# Patient Record
Sex: Female | Born: 1950 | Race: White | Hispanic: No | Smoking: Former smoker
Health system: Southern US, Community
[De-identification: ages and names within clinical notes are randomized; demographics above are authoritative.]

## PROBLEM LIST (undated history)

## (undated) DIAGNOSIS — Z86711 Personal history of pulmonary embolism: Secondary | ICD-10-CM

## (undated) DIAGNOSIS — F419 Anxiety disorder, unspecified: Secondary | ICD-10-CM

## (undated) DIAGNOSIS — K589 Irritable bowel syndrome without diarrhea: Secondary | ICD-10-CM

## (undated) DIAGNOSIS — H33199 Other retinoschisis and retinal cysts, unspecified eye: Secondary | ICD-10-CM

## (undated) DIAGNOSIS — K602 Anal fissure, unspecified: Secondary | ICD-10-CM

## (undated) DIAGNOSIS — G43909 Migraine, unspecified, not intractable, without status migrainosus: Secondary | ICD-10-CM

## (undated) DIAGNOSIS — K5790 Diverticulosis of intestine, part unspecified, without perforation or abscess without bleeding: Secondary | ICD-10-CM

## (undated) DIAGNOSIS — I2699 Other pulmonary embolism without acute cor pulmonale: Secondary | ICD-10-CM

## (undated) DIAGNOSIS — H409 Unspecified glaucoma: Secondary | ICD-10-CM

## (undated) DIAGNOSIS — E039 Hypothyroidism, unspecified: Secondary | ICD-10-CM

## (undated) DIAGNOSIS — H548 Legal blindness, as defined in USA: Secondary | ICD-10-CM

## (undated) DIAGNOSIS — D75A Glucose-6-phosphate dehydrogenase (G6PD) deficiency without anemia: Secondary | ICD-10-CM

## (undated) DIAGNOSIS — E7800 Pure hypercholesterolemia, unspecified: Secondary | ICD-10-CM

## (undated) DIAGNOSIS — F32A Depression, unspecified: Secondary | ICD-10-CM

## (undated) DIAGNOSIS — L309 Dermatitis, unspecified: Secondary | ICD-10-CM

## (undated) DIAGNOSIS — G43109 Migraine with aura, not intractable, without status migrainosus: Secondary | ICD-10-CM

## (undated) DIAGNOSIS — I10 Essential (primary) hypertension: Secondary | ICD-10-CM

## (undated) DIAGNOSIS — K760 Fatty (change of) liver, not elsewhere classified: Secondary | ICD-10-CM

## (undated) DIAGNOSIS — R1011 Right upper quadrant pain: Secondary | ICD-10-CM

## (undated) DIAGNOSIS — R091 Pleurisy: Secondary | ICD-10-CM

## (undated) DIAGNOSIS — L509 Urticaria, unspecified: Secondary | ICD-10-CM

## (undated) DIAGNOSIS — R112 Nausea with vomiting, unspecified: Secondary | ICD-10-CM

## (undated) DIAGNOSIS — K8020 Calculus of gallbladder without cholecystitis without obstruction: Secondary | ICD-10-CM

## (undated) DIAGNOSIS — N200 Calculus of kidney: Secondary | ICD-10-CM

## (undated) DIAGNOSIS — J069 Acute upper respiratory infection, unspecified: Secondary | ICD-10-CM

## (undated) DIAGNOSIS — D126 Benign neoplasm of colon, unspecified: Secondary | ICD-10-CM

## (undated) DIAGNOSIS — R079 Chest pain, unspecified: Secondary | ICD-10-CM

## (undated) DIAGNOSIS — R002 Palpitations: Secondary | ICD-10-CM

## (undated) DIAGNOSIS — N281 Cyst of kidney, acquired: Secondary | ICD-10-CM

## (undated) DIAGNOSIS — D573 Sickle-cell trait: Secondary | ICD-10-CM

## (undated) DIAGNOSIS — F329 Major depressive disorder, single episode, unspecified: Secondary | ICD-10-CM

## (undated) DIAGNOSIS — H269 Unspecified cataract: Secondary | ICD-10-CM

## (undated) DIAGNOSIS — J45909 Unspecified asthma, uncomplicated: Secondary | ICD-10-CM

## (undated) DIAGNOSIS — M1990 Unspecified osteoarthritis, unspecified site: Secondary | ICD-10-CM

## (undated) DIAGNOSIS — G8929 Other chronic pain: Secondary | ICD-10-CM

## (undated) DIAGNOSIS — Z9889 Other specified postprocedural states: Secondary | ICD-10-CM

## (undated) DIAGNOSIS — Z77098 Contact with and (suspected) exposure to other hazardous, chiefly nonmedicinal, chemicals: Secondary | ICD-10-CM

## (undated) DIAGNOSIS — K219 Gastro-esophageal reflux disease without esophagitis: Secondary | ICD-10-CM

## (undated) HISTORY — DX: Pleurisy: R09.1

## (undated) HISTORY — DX: Right upper quadrant pain: R10.11

## (undated) HISTORY — DX: Personal history of pulmonary embolism: Z86.711

## (undated) HISTORY — DX: Other chronic pain: G89.29

## (undated) HISTORY — DX: Calculus of kidney: N20.0

## (undated) HISTORY — DX: Acute upper respiratory infection, unspecified: J06.9

## (undated) HISTORY — DX: Irritable bowel syndrome, unspecified: K58.9

## (undated) HISTORY — DX: Anal fissure, unspecified: K60.2

## (undated) HISTORY — PX: ANGIOPLASTY: SHX39

## (undated) HISTORY — DX: Glucose-6-phosphate dehydrogenase (G6PD) deficiency without anemia: D75.A

## (undated) HISTORY — PX: ABDOMINAL HYSTERECTOMY: SHX81

## (undated) HISTORY — DX: Depression, unspecified: F32.A

## (undated) HISTORY — DX: Contact with and (suspected) exposure to other hazardous, chiefly nonmedicinal, chemicals: Z77.098

## (undated) HISTORY — DX: Urticaria, unspecified: L50.9

## (undated) HISTORY — DX: Dermatitis, unspecified: L30.9

## (undated) HISTORY — PX: TUBAL LIGATION: SHX77

## (undated) HISTORY — DX: Sickle-cell trait: D57.3

## (undated) HISTORY — DX: Legal blindness, as defined in USA: H54.8

## (undated) HISTORY — DX: Migraine, unspecified, not intractable, without status migrainosus: G43.909

## (undated) HISTORY — DX: Other retinoschisis and retinal cysts, unspecified eye: H33.199

## (undated) HISTORY — DX: Benign neoplasm of colon, unspecified: D12.6

## (undated) HISTORY — DX: Unspecified cataract: H26.9

## (undated) HISTORY — DX: Cyst of kidney, acquired: N28.1

## (undated) HISTORY — PX: OTHER SURGICAL HISTORY: SHX169

## (undated) HISTORY — DX: Calculus of gallbladder without cholecystitis without obstruction: K80.20

## (undated) HISTORY — PX: HEEL SPUR EXCISION: SHX1733

## (undated) HISTORY — DX: Fatty (change of) liver, not elsewhere classified: K76.0

## (undated) HISTORY — DX: Other pulmonary embolism without acute cor pulmonale: I26.99

## (undated) HISTORY — DX: Pure hypercholesterolemia, unspecified: E78.00

## (undated) HISTORY — PX: EXCISIONAL HEMORRHOIDECTOMY: SHX1541

## (undated) HISTORY — PX: LAPAROSCOPY: SHX197

## (undated) HISTORY — PX: APPENDECTOMY: SHX54

## (undated) HISTORY — DX: Unspecified glaucoma: H40.9

## (undated) HISTORY — DX: Major depressive disorder, single episode, unspecified: F32.9

## (undated) HISTORY — PX: CATARACT EXTRACTION: SUR2

## (undated) HISTORY — PX: ADENOIDECTOMY: SUR15

## (undated) HISTORY — DX: Diverticulosis of intestine, part unspecified, without perforation or abscess without bleeding: K57.90

## (undated) HISTORY — DX: Essential (primary) hypertension: I10

## (undated) HISTORY — DX: Anxiety disorder, unspecified: F41.9

## (undated) HISTORY — PX: TONSILLECTOMY: SUR1361

## (undated) HISTORY — PX: FOOT SURGERY: SHX648

---

## 1998-04-10 ENCOUNTER — Other Ambulatory Visit: Admission: RE | Admit: 1998-04-10 | Discharge: 1998-04-10 | Payer: Self-pay | Admitting: Obstetrics and Gynecology

## 1998-10-03 ENCOUNTER — Other Ambulatory Visit: Admission: RE | Admit: 1998-10-03 | Discharge: 1998-10-03 | Payer: Self-pay

## 1999-01-07 ENCOUNTER — Emergency Department (HOSPITAL_COMMUNITY): Admission: EM | Admit: 1999-01-07 | Discharge: 1999-01-07 | Payer: Self-pay | Admitting: Emergency Medicine

## 1999-01-07 ENCOUNTER — Encounter: Payer: Self-pay | Admitting: Emergency Medicine

## 2000-02-11 ENCOUNTER — Ambulatory Visit (HOSPITAL_COMMUNITY): Admission: RE | Admit: 2000-02-11 | Discharge: 2000-02-11 | Payer: Self-pay | Admitting: Unknown Physician Specialty

## 2000-02-11 HISTORY — PX: CARDIAC CATHETERIZATION: SHX172

## 2000-05-05 ENCOUNTER — Encounter: Payer: Self-pay | Admitting: Cardiology

## 2000-05-05 ENCOUNTER — Observation Stay (HOSPITAL_COMMUNITY): Admission: EM | Admit: 2000-05-05 | Discharge: 2000-05-06 | Payer: Self-pay | Admitting: Cardiology

## 2001-07-28 ENCOUNTER — Ambulatory Visit (HOSPITAL_BASED_OUTPATIENT_CLINIC_OR_DEPARTMENT_OTHER): Admission: RE | Admit: 2001-07-28 | Discharge: 2001-07-28 | Payer: Self-pay | Admitting: Podiatry

## 2001-12-19 ENCOUNTER — Encounter: Payer: Self-pay | Admitting: Internal Medicine

## 2001-12-19 ENCOUNTER — Ambulatory Visit (HOSPITAL_COMMUNITY): Admission: RE | Admit: 2001-12-19 | Discharge: 2001-12-19 | Payer: Self-pay | Admitting: Internal Medicine

## 2001-12-29 ENCOUNTER — Ambulatory Visit (HOSPITAL_COMMUNITY): Admission: RE | Admit: 2001-12-29 | Discharge: 2001-12-29 | Payer: Self-pay | Admitting: Internal Medicine

## 2001-12-29 ENCOUNTER — Encounter: Payer: Self-pay | Admitting: Internal Medicine

## 2002-09-12 ENCOUNTER — Encounter (HOSPITAL_COMMUNITY): Admission: RE | Admit: 2002-09-12 | Discharge: 2002-10-12 | Payer: Self-pay | Admitting: Oncology

## 2002-09-12 ENCOUNTER — Encounter: Admission: RE | Admit: 2002-09-12 | Discharge: 2002-09-12 | Payer: Self-pay | Admitting: Oncology

## 2002-12-21 ENCOUNTER — Ambulatory Visit (HOSPITAL_COMMUNITY): Admission: RE | Admit: 2002-12-21 | Discharge: 2002-12-21 | Payer: Self-pay | Admitting: Internal Medicine

## 2002-12-21 ENCOUNTER — Encounter: Payer: Self-pay | Admitting: Internal Medicine

## 2003-05-08 ENCOUNTER — Ambulatory Visit (HOSPITAL_COMMUNITY): Admission: RE | Admit: 2003-05-08 | Discharge: 2003-05-08 | Payer: Self-pay | Admitting: Family Medicine

## 2003-05-08 ENCOUNTER — Encounter: Payer: Self-pay | Admitting: Family Medicine

## 2003-05-21 ENCOUNTER — Ambulatory Visit (HOSPITAL_COMMUNITY): Admission: RE | Admit: 2003-05-21 | Discharge: 2003-05-21 | Payer: Self-pay | Admitting: Family Medicine

## 2003-05-21 ENCOUNTER — Encounter: Payer: Self-pay | Admitting: Family Medicine

## 2004-05-14 ENCOUNTER — Ambulatory Visit (HOSPITAL_COMMUNITY): Admission: RE | Admit: 2004-05-14 | Discharge: 2004-05-14 | Payer: Self-pay | Admitting: Family Medicine

## 2004-08-29 ENCOUNTER — Ambulatory Visit (HOSPITAL_COMMUNITY): Admission: RE | Admit: 2004-08-29 | Discharge: 2004-08-29 | Payer: Self-pay | Admitting: *Deleted

## 2004-12-01 ENCOUNTER — Ambulatory Visit (HOSPITAL_COMMUNITY): Admission: RE | Admit: 2004-12-01 | Discharge: 2004-12-01 | Payer: Self-pay | Admitting: *Deleted

## 2005-01-20 ENCOUNTER — Emergency Department (HOSPITAL_COMMUNITY): Admission: EM | Admit: 2005-01-20 | Discharge: 2005-01-21 | Payer: Self-pay | Admitting: *Deleted

## 2005-01-23 ENCOUNTER — Ambulatory Visit (HOSPITAL_COMMUNITY): Admission: RE | Admit: 2005-01-23 | Discharge: 2005-01-23 | Payer: Self-pay | Admitting: *Deleted

## 2005-04-03 ENCOUNTER — Ambulatory Visit: Payer: Self-pay | Admitting: Ophthalmology

## 2005-04-09 ENCOUNTER — Ambulatory Visit: Payer: Self-pay | Admitting: Ophthalmology

## 2005-05-19 ENCOUNTER — Ambulatory Visit (HOSPITAL_COMMUNITY): Admission: RE | Admit: 2005-05-19 | Discharge: 2005-05-19 | Payer: Self-pay | Admitting: *Deleted

## 2005-10-22 ENCOUNTER — Ambulatory Visit: Payer: Self-pay | Admitting: Internal Medicine

## 2005-11-18 ENCOUNTER — Ambulatory Visit: Payer: Self-pay | Admitting: Internal Medicine

## 2005-11-18 ENCOUNTER — Ambulatory Visit (HOSPITAL_COMMUNITY): Admission: RE | Admit: 2005-11-18 | Discharge: 2005-11-18 | Payer: Self-pay | Admitting: Internal Medicine

## 2005-11-24 ENCOUNTER — Ambulatory Visit (HOSPITAL_COMMUNITY): Admission: RE | Admit: 2005-11-24 | Discharge: 2005-11-24 | Payer: Self-pay | Admitting: Internal Medicine

## 2006-08-27 ENCOUNTER — Ambulatory Visit (HOSPITAL_COMMUNITY): Admission: RE | Admit: 2006-08-27 | Discharge: 2006-08-27 | Payer: Self-pay | Admitting: Internal Medicine

## 2007-06-16 ENCOUNTER — Ambulatory Visit: Payer: Self-pay | Admitting: *Deleted

## 2007-09-02 ENCOUNTER — Encounter (HOSPITAL_COMMUNITY): Admission: RE | Admit: 2007-09-02 | Discharge: 2007-10-02 | Payer: Self-pay | Admitting: Internal Medicine

## 2007-09-28 ENCOUNTER — Ambulatory Visit: Payer: Self-pay | Admitting: Gastroenterology

## 2007-10-05 ENCOUNTER — Ambulatory Visit (HOSPITAL_COMMUNITY): Admission: RE | Admit: 2007-10-05 | Discharge: 2007-10-05 | Payer: Self-pay | Admitting: Gastroenterology

## 2007-10-06 DIAGNOSIS — G8929 Other chronic pain: Secondary | ICD-10-CM

## 2007-10-06 HISTORY — DX: Other chronic pain: G89.29

## 2007-10-13 ENCOUNTER — Encounter: Payer: Self-pay | Admitting: Gastroenterology

## 2007-10-13 ENCOUNTER — Ambulatory Visit (HOSPITAL_COMMUNITY): Admission: RE | Admit: 2007-10-13 | Discharge: 2007-10-13 | Payer: Self-pay | Admitting: Gastroenterology

## 2007-10-27 ENCOUNTER — Ambulatory Visit: Payer: Self-pay | Admitting: Gastroenterology

## 2007-11-15 ENCOUNTER — Ambulatory Visit: Payer: Self-pay | Admitting: Gastroenterology

## 2007-12-04 HISTORY — PX: CHOLECYSTECTOMY: SHX55

## 2007-12-05 ENCOUNTER — Ambulatory Visit (HOSPITAL_COMMUNITY): Admission: RE | Admit: 2007-12-05 | Discharge: 2007-12-06 | Payer: Self-pay | Admitting: General Surgery

## 2007-12-05 ENCOUNTER — Encounter (INDEPENDENT_AMBULATORY_CARE_PROVIDER_SITE_OTHER): Payer: Self-pay | Admitting: General Surgery

## 2008-03-01 ENCOUNTER — Ambulatory Visit: Payer: Self-pay | Admitting: Gastroenterology

## 2008-09-12 ENCOUNTER — Ambulatory Visit (HOSPITAL_COMMUNITY): Admission: RE | Admit: 2008-09-12 | Discharge: 2008-09-12 | Payer: Self-pay | Admitting: Internal Medicine

## 2009-10-02 ENCOUNTER — Ambulatory Visit (HOSPITAL_COMMUNITY): Admission: RE | Admit: 2009-10-02 | Discharge: 2009-10-02 | Payer: Self-pay | Admitting: Internal Medicine

## 2009-12-10 ENCOUNTER — Inpatient Hospital Stay (HOSPITAL_COMMUNITY): Admission: EM | Admit: 2009-12-10 | Discharge: 2009-12-15 | Payer: Self-pay | Admitting: Emergency Medicine

## 2009-12-21 ENCOUNTER — Emergency Department (HOSPITAL_COMMUNITY): Admission: EM | Admit: 2009-12-21 | Discharge: 2009-12-21 | Payer: Self-pay | Admitting: Emergency Medicine

## 2010-01-03 ENCOUNTER — Ambulatory Visit (HOSPITAL_COMMUNITY): Admission: RE | Admit: 2010-01-03 | Discharge: 2010-01-03 | Payer: Self-pay | Admitting: Internal Medicine

## 2010-04-19 ENCOUNTER — Ambulatory Visit (HOSPITAL_COMMUNITY): Admission: RE | Admit: 2010-04-19 | Discharge: 2010-04-19 | Payer: Self-pay | Admitting: Internal Medicine

## 2010-06-21 ENCOUNTER — Ambulatory Visit (HOSPITAL_COMMUNITY)
Admission: RE | Admit: 2010-06-21 | Discharge: 2010-06-21 | Payer: Self-pay | Source: Home / Self Care | Admitting: Internal Medicine

## 2010-10-03 ENCOUNTER — Ambulatory Visit (HOSPITAL_COMMUNITY)
Admission: RE | Admit: 2010-10-03 | Discharge: 2010-10-03 | Payer: Self-pay | Source: Home / Self Care | Attending: Internal Medicine | Admitting: Internal Medicine

## 2010-12-09 ENCOUNTER — Encounter: Payer: Self-pay | Admitting: Internal Medicine

## 2010-12-10 ENCOUNTER — Encounter: Payer: Self-pay | Admitting: Gastroenterology

## 2010-12-10 ENCOUNTER — Ambulatory Visit (INDEPENDENT_AMBULATORY_CARE_PROVIDER_SITE_OTHER): Payer: Self-pay | Admitting: Gastroenterology

## 2010-12-10 DIAGNOSIS — R1011 Right upper quadrant pain: Secondary | ICD-10-CM | POA: Insufficient documentation

## 2010-12-10 DIAGNOSIS — K589 Irritable bowel syndrome without diarrhea: Secondary | ICD-10-CM

## 2010-12-11 ENCOUNTER — Encounter: Payer: Self-pay | Admitting: Internal Medicine

## 2010-12-11 ENCOUNTER — Other Ambulatory Visit: Payer: Self-pay | Admitting: Internal Medicine

## 2010-12-11 DIAGNOSIS — R1011 Right upper quadrant pain: Secondary | ICD-10-CM

## 2010-12-12 ENCOUNTER — Encounter: Payer: Self-pay | Admitting: Gastroenterology

## 2010-12-13 ENCOUNTER — Encounter: Payer: Self-pay | Admitting: Gastroenterology

## 2010-12-15 ENCOUNTER — Ambulatory Visit: Payer: Self-pay | Admitting: Gastroenterology

## 2010-12-16 ENCOUNTER — Ambulatory Visit (HOSPITAL_COMMUNITY)
Admission: RE | Admit: 2010-12-16 | Discharge: 2010-12-16 | Disposition: A | Discharge status: Home / Self Care | Payer: Medicaid Other | Source: Ambulatory Visit | Attending: Internal Medicine | Admitting: Internal Medicine

## 2010-12-16 DIAGNOSIS — I10 Essential (primary) hypertension: Secondary | ICD-10-CM | POA: Insufficient documentation

## 2010-12-16 DIAGNOSIS — K7689 Other specified diseases of liver: Secondary | ICD-10-CM | POA: Insufficient documentation

## 2010-12-16 DIAGNOSIS — R110 Nausea: Secondary | ICD-10-CM | POA: Insufficient documentation

## 2010-12-16 DIAGNOSIS — R1011 Right upper quadrant pain: Secondary | ICD-10-CM | POA: Insufficient documentation

## 2010-12-16 NOTE — Letter (Signed)
Summary: ABD U/S ORDER  ABD U/S ORDER   Imported By: Sofie Rower 12/11/2010 08:59:00  _____________________________________________________________________  External Attachment:    Type:   Image     Comment:   External Document

## 2010-12-16 NOTE — Medication Information (Addendum)
Summary: LANSOPRAZOLE DR 30MG  LANSOPRAZOLE DR 30MG   Imported By: Hoy Morn 12/12/2010 11:18:40  _____________________________________________________________________  External Attachment:    Type:   Image     Comment:   External Document  Appended Document: LANSOPRAZOLE DR 30MG needs P.A.  Appended Document: LANSOPRAZOLE DR 30MG working on [employer]

## 2010-12-16 NOTE — Letter (Signed)
Summary: TCS ORDER  TCS ORDER   Imported By: Sofie Rower 12/11/2010 08:57:59  _____________________________________________________________________  External Attachment:    Type:   Image     Comment:   External Document

## 2010-12-16 NOTE — Assessment & Plan Note (Signed)
Summary: ABD PAIN,CONSULT FOR TCS   Vital Signs:  Patient profile:   60 year old female Height:      67 inches Weight:      196 pounds BMI:     30.81 Temp:     98.3 degrees F oral Pulse rate:   92 / minute BP sitting:   124 / 76  (left arm)  Vitals Entered By: Loney Loh LPN (December 09, 6960 95:28 AM)  Visit Type:  Initial Consult Referring Provider:  Dr. Legrand Rams  Primary Care Provider:  Dr. Legrand Rams   History of Present Illness: [name] is a 60 year old pleasant female who presents today with RUQ pain and requesting updated colonoscopy. Upon review of chart, she has a hx of chronic RUQ pain, despite cholecystectomy in past.  Today, reports RUQ pain, worsening for past 6 weeks. feels like somebody is leaning into her with an elbow. radiates to back. stabbing. intermittent, yet never really goes completely away. sometimes bra will push on area and makes worse.  no jaundice, pruritis. +N/V with severe pain. As of note, prior to chole in March 2009, actually underwent EUS, which showed slightly dilated CBD (up to 7.5 mm), otherwise nl. Proceeded with chole in March 2009:  Reports blood in stool X 6 weeks, more over past week as well as paper hematochezia.  Denies issues with reflux. Has taken Nexium, prilosec in remote past for this discomfort. didn't help. has tried digestive aids, which worked years ago. however, notices no difference. sometimes alka setzer will offer slight relief. rare use of motrin.  4-5 BMs/day. Hx of IBS. usually goes first thing in the morning. urgency, not necessarily loose.  Feels more bloated. Eats small meals throughout the day. Wt fluctuates. No significant wt loss. No dysphagia/odynophagia.   husband just diagnosed with stage 4 prostate ca.   TCS Feb 2007: small external hemorrhoids, otherwise nl. Due for repeat 5-10 years (mother +colon ca)  Current Medications (verified): 1)  Tricor 145 Mg Tabs (Fenofibrate) .... Take One Once Daily 2)  Alprazolam 0.5 Mg  Tabs (Alprazolam) .... Take One Once Daily As Needed 3)  Clindamycin Hcl 300 Mg Caps (Clindamycin Hcl) .... Take Two Prior To Appointment 4)  Zolpidem Tartrate 10 Mg Tabs (Zolpidem Tartrate) .... Take One Once Daily At Bed Time 5)  Brimonidine Tartrate 0.15 % Soln (Brimonidine Tartrate) .... One Drop Two Times A Day in Each Eye 6)  Latanoprost 0.005 % Soln (Latanoprost) .... One Drop in Each Eye Once Daily  Allergies (verified): 1)  ! Sulfa 2)  ! * Fava Beans  Past History:  Past Medical History: Chronic RUQ pain HTN Hypercholesterolemia Glaucoma Mitral valve prolapse?  Past Surgical History: cholecystectomy complete hysterectomy appendectomy cataract surgery angioplasty  Family History: Mother: deceased, age 58, colon ca Father:deceased, pallete cancer at 76 Sister: Crohns Brother: intestinal fissures?? DM  Social History: Occupation: housewife Patient is a former smoker.  Alcohol Use - yes, socially Illicit Drug Use - no Smoking Status:  quit Drug Use:  no  Review of Systems General:  Denies fever, chills, and anorexia. Eyes:  Denies blurring, irritation, and discharge. ENT:  Denies sore throat, hoarseness, and difficulty swallowing. CV:  Denies chest pains and syncope. Resp:  Denies dyspnea at rest and wheezing. GI:  See HPI. GU:  Denies urinary burning and urinary frequency. MS:  Denies joint stiffness. Derm:  Denies rash, itching, and dry skin. Neuro:  Denies weakness and syncope. Psych:  Denies depression and anxiety.  Physical  Exam  General:  Well developed, well nourished, no acute distress. Head:  Normocephalic and atraumatic. Eyes:  sclera without icterus Lungs:  Clear throughout to auscultation. Heart:  Regular rate and rhythm; no murmurs, rubs,  or bruits. Abdomen:  +BS, soft, ttp in RUQ, non-distended, no HSM. No rebound or guarding Msk:  Symmetrical with no gross deformities. Normal posture. Neurologic:  Alert and  oriented x4;  grossly  normal neurologically. Skin:  Intact without significant lesions or rashes. Psych:  Alert and cooperative. Normal mood and affect.   Impression & Recommendations:  Problem # 1:  ABDOMINAL PAIN RIGHT UPPER QUADRANT (ICD-33.46) 60 year old female with hx of chronic RUQ pain dating back to 2008, slight improvement after cholecystectomy in March 2009. Continues to report constant, stabbing pain, radiates to back. Marland Kitchen +N/V if pain severe. has had EUS in 2008. Doubt this is a picture of gastritis. May have component of functional abd pain heightened by anxiety (husband diagnosed with prostate ca). ?pancreatic involvement. LFTs nl Jan 2012.  [place] of abd, assess for CBD dilatation, other etiology Lipase (CMP already on file) Prevacid 30 mg by mouth daily to pharmacy of choice (pt has done well with this in past)   Orders: T-Lipase ([id number]) Consultation Level III (07218)  Problem # 2:  IRRITABLE BOWEL SYNDROME (ICD-564.1)  Hx of IBS, reports 4-5BMs per day. +urgency, may or may not be loose stools. Feels bloated. brbpr X 6 weeks, more over past week. +FH of colon ca 1st degree relative (mom). Last TCS in 2007 with small external hemorrhoids otherwise nl. Likely hematochezia secondary to hemorrhoids; will proceed with colonoscopy due to [place].   TCS with Dr. Gala Romney: the R/B/A have been discussed in detail; pt states understanding and desires to proceed. Probiotic daily high fiber diet ?Bentyl in future  Orders: Consultation Level III (28833) Prescriptions: PREVACID 30 MG CPDR (LANSOPRAZOLE) take 1 by mouth 30 minutes before breakfast  #31 x 1   Entered and Authorized by:   Laban Emperor NP   Signed by:   Laban Emperor NP on 12/10/2010   Method used:   Faxed to ...       [facility] (retail)       [address]       [place], [place]  [address]       Ph: [phone] or [phone]       Fax: [phone]   RxID:   [id number]    Orders Added: 1)  T-Lipase  [id number] 2)  Consultation Level III [27670]

## 2010-12-22 ENCOUNTER — Encounter: Payer: Medicaid Other | Admitting: Internal Medicine

## 2010-12-22 ENCOUNTER — Other Ambulatory Visit: Payer: Self-pay | Admitting: Internal Medicine

## 2010-12-22 ENCOUNTER — Encounter: Payer: Self-pay | Admitting: Gastroenterology

## 2010-12-22 ENCOUNTER — Ambulatory Visit (HOSPITAL_COMMUNITY)
Admission: RE | Admit: 2010-12-22 | Discharge: 2010-12-22 | Disposition: A | Discharge status: Home / Self Care | Payer: Medicaid Other | Source: Ambulatory Visit | Attending: Internal Medicine | Admitting: Internal Medicine

## 2010-12-22 DIAGNOSIS — Z860100 Personal history of colon polyps, unspecified: Secondary | ICD-10-CM | POA: Insufficient documentation

## 2010-12-22 DIAGNOSIS — E785 Hyperlipidemia, unspecified: Secondary | ICD-10-CM | POA: Insufficient documentation

## 2010-12-22 DIAGNOSIS — K921 Melena: Secondary | ICD-10-CM | POA: Insufficient documentation

## 2010-12-22 DIAGNOSIS — D126 Benign neoplasm of colon, unspecified: Secondary | ICD-10-CM

## 2010-12-22 DIAGNOSIS — R109 Unspecified abdominal pain: Secondary | ICD-10-CM | POA: Insufficient documentation

## 2010-12-22 HISTORY — PX: COLONOSCOPY: SHX174

## 2010-12-28 LAB — BASIC METABOLIC PANEL
BUN: 10 mg/dL (ref 6–23)
BUN: 8 mg/dL (ref 6–23)
BUN: 9 mg/dL (ref 6–23)
CO2: 25 mEq/L (ref 19–32)
CO2: 26 mEq/L (ref 19–32)
Calcium: 10.1 mg/dL (ref 8.4–10.5)
Calcium: 8.8 mg/dL (ref 8.4–10.5)
Calcium: 8.8 mg/dL (ref 8.4–10.5)
Chloride: 104 mEq/L (ref 96–112)
Chloride: 107 mEq/L (ref 96–112)
Creatinine, Ser: 1.26 mg/dL — ABNORMAL HIGH (ref 0.4–1.2)
Creatinine, Ser: 1.28 mg/dL — ABNORMAL HIGH (ref 0.4–1.2)
Creatinine, Ser: 1.51 mg/dL — ABNORMAL HIGH (ref 0.4–1.2)
GFR calc Af Amer: 52 mL/min — ABNORMAL LOW (ref >60)
GFR calc Af Amer: 55 mL/min — ABNORMAL LOW (ref >60)
GFR calc non Af Amer: 44 mL/min — ABNORMAL LOW (ref >60)
Glucose, Bld: 129 mg/dL — ABNORMAL HIGH (ref 70–99)
Potassium: 3.8 mEq/L (ref 3.5–5.1)
Sodium: 140 mEq/L (ref 135–145)

## 2010-12-28 LAB — DIFFERENTIAL
Basophils Absolute: 0 10*3/uL (ref 0.0–0.1)
Basophils Relative: 0 % (ref 0–1)
Basophils Relative: 0 % (ref 0–1)
Basophils Relative: 1 % (ref 0–1)
Eosinophils Absolute: 0.3 10*3/uL (ref 0.0–0.7)
Eosinophils Absolute: 0.3 10*3/uL (ref 0.0–0.7)
Lymphocytes Relative: 16 % (ref 12–46)
Lymphocytes Relative: 26 % (ref 12–46)
Lymphocytes Relative: 27 % (ref 12–46)
Lymphs Abs: 1.4 10*3/uL (ref 0.7–4.0)
Lymphs Abs: 1.5 10*3/uL (ref 0.7–4.0)
Monocytes Absolute: 0.5 10*3/uL (ref 0.1–1.0)
Monocytes Absolute: 0.7 10*3/uL (ref 0.1–1.0)
Monocytes Relative: 7 % (ref 3–12)
Neutro Abs: 3.3 10*3/uL (ref 1.7–7.7)
Neutro Abs: 4.3 10*3/uL (ref 1.7–7.7)
Neutro Abs: 4.4 10*3/uL (ref 1.7–7.7)
Neutro Abs: 6.5 10*3/uL (ref 1.7–7.7)
Neutrophils Relative %: 58 % (ref 43–77)
Neutrophils Relative %: 66 % (ref 43–77)
Neutrophils Relative %: 74 % (ref 43–77)

## 2010-12-28 LAB — URINE MICROSCOPIC-ADD ON

## 2010-12-28 LAB — URINE CULTURE

## 2010-12-28 LAB — COMPREHENSIVE METABOLIC PANEL
Albumin: 3.8 g/dL (ref 3.5–5.2)
Alkaline Phosphatase: 30 U/L — ABNORMAL LOW (ref 39–117)
BUN: 16 mg/dL (ref 6–23)
Creatinine, Ser: 1.86 mg/dL — ABNORMAL HIGH (ref 0.4–1.2)
Glucose, Bld: 130 mg/dL — ABNORMAL HIGH (ref 70–99)
Total Bilirubin: 0.7 mg/dL (ref 0.3–1.2)
Total Protein: 6.5 g/dL (ref 6.0–8.3)

## 2010-12-28 LAB — CBC
HCT: 29.5 % — ABNORMAL LOW (ref 36.0–46.0)
Hemoglobin: 10.4 g/dL — ABNORMAL LOW (ref 12.0–15.0)
Hemoglobin: 10.4 g/dL — ABNORMAL LOW (ref 12.0–15.0)
MCHC: 34.7 g/dL (ref 30.0–36.0)
MCHC: 35.8 g/dL (ref 30.0–36.0)
MCV: 94.5 fL (ref 78.0–100.0)
MCV: 96 fL (ref 78.0–100.0)
MCV: 97.1 fL (ref 78.0–100.0)
Platelets: 146 10*3/uL — ABNORMAL LOW (ref 150–400)
Platelets: 180 10*3/uL (ref 150–400)
Platelets: 186 10*3/uL (ref 150–400)
RBC: 3.17 MIL/uL — ABNORMAL LOW (ref 3.87–5.11)
RDW: 12.1 % (ref 11.5–15.5)
WBC: 5.8 10*3/uL (ref 4.0–10.5)
WBC: 6.6 10*3/uL (ref 4.0–10.5)

## 2010-12-28 LAB — IRON AND TIBC
Iron: 23 ug/dL — ABNORMAL LOW (ref 42–135)
TIBC: 329 ug/dL (ref 250–470)

## 2010-12-28 LAB — CULTURE, BLOOD (ROUTINE X 2)
Culture: NO GROWTH
Report Status: 3132011
Report Status: 3132011
Report Status: 3142011
Report Status: 3142011

## 2010-12-28 LAB — HEMOCCULT GUIAC POC 1CARD (OFFICE)
Fecal Occult Bld: NEGATIVE
Fecal Occult Bld: NEGATIVE
Fecal Occult Bld: NEGATIVE

## 2010-12-28 LAB — URINALYSIS, ROUTINE W REFLEX MICROSCOPIC
Hgb urine dipstick: NEGATIVE
Nitrite: NEGATIVE
Protein, ur: NEGATIVE mg/dL
Specific Gravity, Urine: <1.005 — ABNORMAL LOW (ref 1.005–1.030)
Urobilinogen, UA: 0.2 mg/dL (ref 0.0–1.0)

## 2010-12-28 LAB — VITAMIN B12: Vitamin B-12: 580 pg/mL (ref 211–911)

## 2010-12-28 LAB — RETICULOCYTES
RBC.: 2.63 MIL/uL — ABNORMAL LOW (ref 3.87–5.11)
Retic Count, Absolute: 71 10*3/uL (ref 19.0–186.0)

## 2010-12-30 NOTE — Op Note (Signed)
  NAME:  [name], [name]              ACCOUNT NO.:  [id number]  MEDICAL RECORD NO.:  [id number]           PATIENT TYPE:  O  LOCATION:  DAYP                          FACILITY:  [facility]  PHYSICIAN:  R. Garfield Cornea, M.D. DATE OF BIRTH:  [birth year]  DATE OF PROCEDURE: DATE OF DISCHARGE:                              OPERATIVE REPORT   SURGEON:  R. Garfield Cornea, MD  A 60 year old lady with personal history of colonic polyps, chronic abdominal pain (much improved over the past 1 week), here for colonoscopy, was having some paper hematochezia, but this resolved. Status post cholecystectomy, recent abdominal ultrasound demonstrated no mild dilation of biliary tree, small left hepatic cyst, epigastric pain, serum lipase came back normal at 35.  Colonoscopy now being performed. Risks, benefits, limitations, alternatives, and imponderables have been discussed, questions answered.  Please see the documentation in the medical record.  PROCEDURE NOTE:  O2 saturation, blood pressure, pulse, respirations were monitored throughout the entire procedure.  CONSCIOUS SEDATION:  Versed 8 mg IV, Demerol 175 mg IV in divided doses, Phenergan 25 mg dilute slow IV push to in divided dose to augment conscious sedation.  INSTRUMENT:  Pentax video chip system.  FINDINGS:  Digital rectal exam revealed no abnormalities.  Endoscopic findings:  Prep was adequate.  Colon:  Colonic mucosa was surveyed from the rectosigmoid junction through the left transverse, right colon, appendiceal orifice, ileocecal valve, and cecum.  These structures were well seen and photographed for the record.  Terminal ileum was intubated to 10 cm.  From this level, scope was slowly and cautiously withdrawn. All previous mucosal surfaces were again seen.  The patient has single diminutive polyp at the base of the cecum, which was cold biopsied/removed.  The patient had some scattered submucosal petechiae on the left colon of uncertain  significance.  Remainder of the colonic mucosa appeared entirely normal as did the terminal ileal mucosa.  The scope was pulled down in to the rectum, where thorough examination of the rectal mucosa including retroflexed view of the anal verge demonstrated no abnormalities.  The patient tolerated the procedure well.  Cecal withdrawal time 10 minutes.  IMPRESSION: 1. Normal rectum. 2. Few scattered submucosal petechiae on left colon, otherwise normal     colonic mucosa, terminal ileal mucosa aside from diminutive cecal     polyp, status post cold biopsy removal.  RECOMMENDATIONS: 1. Continue Prevacid 30 mg orally daily. 2. Follow up on path. 3. Further recommendations to follow.     Bridgette Habermann, M.D.     RMR/MEDQ  D:  12/22/2010  T:  12/22/2010  Job:  [id number]  cc:   Tesfaye D. Legrand Rams, MD Fax: [phone]  Electronically Signed by Jannette Spanner M.D. on 12/30/2010 11:22:56 AM

## 2011-01-01 NOTE — Medication Information (Signed)
Summary: PA for lansoprazole  PA for lansoprazole   Imported By: Burnadette Peter LPN 22/97/9892 11:94:17  _____________________________________________________________________  External Attachment:    Type:   Image     Comment:   External Document

## 2011-02-17 NOTE — Assessment & Plan Note (Signed)
[name], [name]               CHART#:  [id number]   DATE:  09/28/2007                       DOB:  10-31-1950   REFERRING PHYSICIAN:  Tesfaye D. Legrand Rams, MD.   PROBLEM LIST:  1. Abdominal pain.  2. Glaucoma.  3. Anxiety.  4. Lysis of adhesions in 2006.   SUBJECTIVE:  [name] is a 60 year old female who presents as a  return patient visit.  She was last seen by Dr. Laural Golden in February of  2007.  He performed a colonoscopy because she was complaining of right  lower quadrant pain and had a known history of extensive adhesions and  polyps.  She said she has not been having problems with her abdomen  since 2001.  She began to have pain in her right side in September of  2008.  She did a homeopathic regimen and states she passed a green  gelatinous thing.  She complains of having pain in her right upper  quadrant.  Dr. Legrand Rams obtained a hepatic function panel, amylase and  lipase on the day she had a HIDA scan and the total bili was 1.1,  alkaline phosphatase 59, AST 32, ALT 31, albumin 4.8, lipase 10.  Her  HIDA scan showed an ejection fraction of 47.3 with normal being greater  than 50%.  She had no evidence of cystic duct obstruction or common bile  duct obstruction.  The tracer was seen in the bile duct at 5-10 minutes  and had activity in the small bowel by 15 minutes and in the gallbladder  at 30 minutes.  There was filling of the gallbladder and spill of  contrast into the small bowel during the study.   She describes the pain as a pressure in her right side.  It pushes to  her back.  It can be stabbing and tearing at times.  She feels an  increase in the intensity of the pain and then releases and then she  gets the bad pain again.  Her episodes usually last continuously for 4  days.  Her first episode began after she ate steak.  Her last episode  was 12/16 to 12/19.  She has 2 soft bowel movements a day.  She does not  eat anything that precipitates the pain.  She  denies any diarrhea, blood  in her stool or black stool that looks like tar.  The only thing that  makes the pain better is time and massaging it.  Note she also has tried  Flexeril which helps.  If the pain is present it is worse after eating.  If she eats too much the pain increases.  She starts to feel bloated.  She also tried vinegar and antacids without any relief.  She has seen  Dr. Lajoyce Corners in the past for abdominal pain but this is a different pain.  This one is now up higher in her ribs.   MEDICATIONS:  1. Xalatan.  2. __________ .  3. Ativan as needed.  4. Propoxyphene as needed.  5. Avalide daily.  6. NitroQuick as needed.  7. Ambien 10 mg daily at bedtime.   OBJECTIVE:  VITAL SIGNS:  Weight 183 pounds, height 5 feet 7 inches  (weight unchanged since 1998).  BMI 28.7 (overweight).  Temperature  98.3, blood pressure 100/80, pulse 88.  GENERAL:  She  is in no apparent distress.  Alert and oriented x4.  HEENT:  Atraumatic, normocephalic.  Pupils equal and reactive to light.  Mouth, no oral lesions.  Posterior pharynx without erythema or exudate.  NECK:  Has full range of motion.  No lymphadenopathy.  LUNGS:  Clear to auscultation bilaterally.  CARDIOVASCULAR:  Regular rhythm.  No murmur.  Normal S1 and S2.  ABDOMEN:  Bowel sounds present and soft.  Nontender.  Nondistended.  No  rebound.  No guarding.  EXTREMITIES:  Her extremities were without clubbing, cyanosis or edema.  NEUROLOGICAL:  She has no focal neurological deficits.   ASSESSMENT:  [name] is a 60 year old female with intermittent  severe right upper quadrant pain that radiates to her back.  The  differential diagnosis includes intermittent small bowel obstruction and  a low likelihood of intermittent biliary obstruction due to sludge or  microlithiasis, or functional abdominal pain.  Doubt she is having  episodes of gastritis.  Thank you for allowing me to see [name]  in consultation.  My  recommendations follow.   RECOMMENDATIONS:  1. Will order a small bowel follow through to evaluate for adhesive      disease.  2. She will get a hepatic function panel and lipase today and then she      is given a prescription to repeat those values if her pain returns.  3. We will schedule an endoscopic ultrasound referral in [facility] to      look for microlithiasis or sludge.  She has a low likelihood of      having sphincter of Oddi dysfunction.  4. Outpatient visit in 6 weeks.       Caro Hight, M.D.  Electronically Signed     SM/MEDQ  D:  09/28/2007  T:  09/28/2007  Job:  [id number]   cc:   Tesfaye D. Legrand Rams, MD

## 2011-02-17 NOTE — Assessment & Plan Note (Signed)
Joanne, [name]               CHART#:  [id number]   DATE:  03/01/2008                       DOB:  [birth year]   REFERRING PHYSICIAN:  Tesfaye D. Legrand Rams, M.D.   PROBLEM LIST:  1. Epigastric and right upper quadrant abdominal pain, likely      multifactorial, but had a component of chronic cholecystitis and is      status post laparoscopic cholecystectomy by Dr. Felicie Morn.  2. Glaucoma.  3. Anxiety.  4. Lysis of adhesions in 2006.   SUBJECTIVE:  [name] is a 60 year old female who presents as a  return patient visit.  She was last seen in February 2009.  She was  complaining of right upper quadrant pain, nausea and vomiting.  Her  gallbladder was removed.  Her pathology showed gallbladder mucosa with  mild chronic inflammation.  Her symptoms are clinically improved.  She  still has some constant right upper quadrant discomfort, and sometimes  it is more noticeable than others.  She is not having any problems  eating any food.  She definitely does not feel like she is having a  heart attack anymore.  She lost her puffiness and feels a whole lot  better.   MEDICATIONS:  1. Xalatan.  2. Bromadine.  3. Propoxyphene.  4. Avalide.  5. NitroQuick.  6. Zolpidem.  7. Herbal digestive aid 3 times daily.   OBJECTIVE/PHYSICAL EXAMINATION:  VITAL SIGNS:  Weight 179 pounds (down 3  pounds since February 2009), height 5 feet 7 inches, BMI 28 (slightly  overweight).  Temperature 98.5, blood pressure 110/78, pulse 92.  GENERAL:  In general she is in no apparent distress.  Alert and oriented  x4.  LUNGS:  Clear to auscultation bilaterally. CARDIOVASCULAR:  Shows a  regular rhythm.  No murmur.  ABDOMEN:  Bowel sounds present.  Soft,  nontender, nondistended.  Mild tenderness to palpation in the right  upper quadrant, not associated with her incisions.   ASSESSMENT:  [name] is a 60 year old female whose mother had  colon cancer diagnosed after the age of 25.  She has a  history of a  simple adenoma in 1998.  Thank you for allowing me to see [name]  in consultation.  My recommendations follow.   RECOMMENDATIONS:  1. She has a followup appointment to see me in 6 months.  2. She may have a colonoscopy every 5 to 10 years due to a first-      degree relative with colon cancer and her personal history of      adenomatous polyp.       Caro Hight, M.D.  Electronically Signed     SM/MEDQ  D:  03/01/2008  T:  03/01/2008  Job:  [id number]   cc:   Tesfaye D. Legrand Rams, MD

## 2011-02-17 NOTE — Op Note (Signed)
[name], [name]              ACCOUNT NO.:  [id number]   MEDICAL RECORD NO.:  [id number]          PATIENT TYPE:  OIB   LOCATION:  A305                          FACILITY:  [facility]   PHYSICIAN:  Felicie Morn, M.D. DATE OF BIRTH:  03-Mar-1951   DATE OF PROCEDURE:  12/05/2007  DATE OF DISCHARGE:                               OPERATIVE REPORT   SURGEON:  Felicie Morn, M.D.   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   SPECIMEN:  Gallbladder.   INDICATIONS FOR PROCEDURE:  This is 60 year old black female who had a  history of sickle cell trait and long-term history of right upper  quadrant pain, nausea and vomiting which was postprandial in nature and  which radiated to her back.  She had a thorough GI workup which included  a sonogram, CT scan, hepatobiliary scan and small bowel series.  She was  referred for laparoscopic cholecystectomy for biliary dyskinesia by the  GI service.  Preoperative liver function studies were grossly within  normal limits, mild elevation of ALT at 39, however, grossly was totally  within normal limits.  Bilirubin was not elevated. This patient has  sickle cell trait.   We discussed surgery with this patient in detail discussing the  complications not limited to, but including bleeding, infection, damage  to bile ducts, perforation of organ, transitory diarrhea and the  possibility that open surgery might be required.  Informed consent was  obtained.  We also discussed with her that results for cholecystectomy  for biliary dyskinesia were not as good as for cholecystectomy with  cholelithiasis.  Informed consent was obtained.   GROSS OPERATIVE FINDINGS:  The patient had adhesions and fatty  infiltration of the liver and the gallbladder near the distal end.  Cystic duct and cystic artery were clearly  visualized, a long cystic  duct which was not cannulated.  The right upper quadrant otherwise  appeared to be normal.  She had some adhesions below the umbilicus from  her previous surgery.  These were not involving any of the right upper  quadrant pathology.   TECHNIQUE:  The patient was placed in supine position after the adequate  administration of general anesthesia via endotracheal intubation.  Her  entire abdomen was prepped with Betadine solution and draped in the  usual manner.  Following aseptic technique, a Foley catheter was placed.  A periumbilical incision was carried out over the superior aspect of the  umbilicus.  The fascia was revealed and grasped with a sharp towel clip  and elevated and with the patient in Trendelenburg, a Veress needle was  inserted and confirmed in position with a saline drop test.  We then  insufflated the abdomen with approximately 3.5 liters of CO2 and then  using the Visiport technique, an 11 mm cannula was placed in the  umbilicus and then under direct vision, another 11 mm cannula was placed  in the epigastrium and two 5 mm cannulas were placed in the right upper  quadrant laterally.  The  gallbladder was grasped.  Its adhesions were  taken down.  We clearly visualized the cystic duct and cystic artery.  These were triply silver clipped on the side of the common bile duct and  singly silver clipped on the side of the gallbladder and divided.  The  gallbladder was then removed without any spillage using the hook cautery  device from the liver bed.  The gallbladder was then retrieved using the  Endosac device.  We checked for hemostasis.  I elected to leave a piece  of Surgicel on the liver bed as well as a Jackson-Pratt drain that  exited from one of the 5 mm cannula sites in the right upper quadrant.  After that, the abdomen was then desufflated.  I closed the fascia in  the area of the umbilicus and the epigastrium using 0 Polysorb sutures  and  used 0.50% Sensorcaine at all port sites to help with postoperative  comfort.  The drain was sutured in place with 3-0 nylon.  Prior to  closure, all sponge, needle and instrument counts were found to be  correct.   ESTIMATED BLOOD LOSS:  Minimal.   FLUIDS REPLACED:  The patient received a liter of crystalloids  intraoperatively.   COMPLICATIONS:  There were no complications.      Felicie Morn, M.D.  Electronically Signed     WB/MEDQ  D:  12/05/2007  T:  12/05/2007  Job:  [id number]   cc:   Caro Hight, M.D.  [address]  [place] , [place] [address]   Tesfaye D. Legrand Rams, MD  Fax: [phone]

## 2011-02-17 NOTE — Assessment & Plan Note (Signed)
NAME:  [name], [name]               CHART#:  [id number]   DATE:  11/15/2007                       DOB:  07-19-1951   REFERRING PHYSICIAN:  Tesfaye Fanta.   PROBLEM LIST:  1. Epigastric and right upper quadrant abdominal pain, likely      multifactorial, but could have a component of chronic      cholecystitis.  2. Glaucoma.  3. Anxiety.  4. Lysis of adhesions in 2006.   SUBJECTIVE:  The patient is a 60 year old female who presents as a  return patient visit.  She continues to have abdominal pain.  She did  have a period of time for 2 weeks when she had no pain, and then the  pain came back.  She is also having problems with constipation.  She has  a bowel movement once a day but it is very hard.  She does not feel like  she completely evacuates her colon.  She has vomited and had nausea 2  times in the last week.  Today she feels nauseous.  When she laughs it  hurts in her abdomen.  She has been trying to increase the fiber in her  diet, and to make her bowel movements more regular.  She has been eating  salads and drinking grape and apple juice which usually help her go to  the bathroom.  Her stools are not black, and she is not having any  rectal bleeding.  She has watery stool one time a week.  She may also  have loose stools which are more solid.  Spicy foods do not effect her  pain.  She reports taking Zelnorm years ago for constipation.   MEDICATIONS:  1. Xalatan.  2. Bromadine.  3. Ativan as needed.  4. Propoxyphene as needed.  5. Avalide 150/12.5 mg daily.  6. Nitro 0.4 mg sublingual as needed.  7. Zolpidem 10 mg nightly.   OBJECTIVE:  Weight 182 pounds (unchanged since December of 2008), height  5 feet 7 inches.  BMI 28.5 (overweight), temperature 98.1, blood  pressure 104/70, pulse is 84.GENERAL:  She is in no apparent distress,  alert and oriented x4.  HEENT EXAM:  Atraumatic, normocephalic.  Pupils  are equal and reactive to light.MOUTH:  No oral  lesions.POSTERIOR  PHARYNX:  Without erythema or exudate.Her lungs are clear to  auscultation bilaterally.Her cardiovascular exam is a regular rhythm, no  murmur, normal S1 and S2.Her abdomen, bowel sounds are present, soft,  nondistended, mild tenderness to palpation in the epigastrium and the  right upper quadrant without rebound or guarding.   ASSESSMENT:  The patient is a 60 year old female who has right upper  quadrant pain and nausea and vomiting which may be secondary to chronic  acalculous cholecystitis.  She had a HIDA scan in November 2008 which  showed a gallbladder ejection fraction of 47.3%.  She also had an  endoscopic ultrasound in January 2009 which showed a dilated bile duct,  but no stones or sludge.  Thank you for allowing me to see the patient  in consultation.  My recommendations follow.   RECOMMENDATIONS:  1. She has been referred to Dr. Romona Curls for evaluation for elective      cholecystectomy.  2. She is given her discharge instructions in writing.  For the      management of her  irregular bowels, she is to drink at least 6 to 8      cups of water or juice daily.  She is to start a high-fiber diet.      If items cause bloat then she should avoid them.  She should add      Benefiber or equivalent once a day.  3. She should continue her probiotics daily.  She may add MiraLax once      a day to her regimen if she is not having a satisfactory bowel      movement after 2 weeks.  She has a follow up appointment to see me      in 6 weeks.       Caro Hight, M.D.  Electronically Signed     SM/MEDQ  D:  11/16/2007  T:  11/17/2007  Job:  [id number]   cc:   Felicie Morn, M.D.  Tesfaye D. Legrand Rams, MD

## 2011-02-20 NOTE — Discharge Summary (Signed)
[name], [name]              ACCOUNT NO.:  [id number]   MEDICAL RECORD NO.:  [id number]           PATIENT TYPE:  AMB   LOCATION:  DAY                           FACILITY:  [facility]   PHYSICIAN:  Benedetto Goad, MD     DATE OF BIRTH:  April 18, 1951   DATE OF ADMISSION:  01/23/2005  DATE OF DISCHARGE:  04/21/2006LH                                 DISCHARGE SUMMARY   DATE OF OUTPATIENT PROCEDURE:  01/23/2005   PROCEDURE PERFORMED:  Laparoscopy with lysis of adhesions.   DISPOSITION:  The patient did do well following the operative procedure in  the postoperative recovery area. She had no significant nausea or vomiting  and was tolerating p.o. intake.  Vital signs remain stable.  Thus, the  patient was discharged to home on the same date of service on 01/23/2005.   PERTINENT LABORATORY STUDIES:  Coagulation studies:  PT and PTT within  normal limits.  Sed rate also within normal limits.  Hemoglobin 13.0,  hematocrit 36.8 with a white count of 5.7.   DISCHARGE MEDICATIONS:  The patient is given a prescription for hydrocodone  which she was taken preoperatively and reportedly doing fairly with.   HOSPITAL COURSE:  See previous dictation.  __________ laparoscopy and lysis  of adhesions performed date of service on 01/23/2005.  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  The patient did well following the operative procedure.  No  significant postoperative nausea or vomiting. I have spoken with the  patient's husband and left possibility that the patient will be observed  overnight, but apparently her clinical status allowed her to be discharged  to home on the same date of service, 01/23/2005.      DC/MEDQ  D:  01/26/2005  T:  01/26/2005  Job:  [id number]

## 2011-02-20 NOTE — Consult Note (Signed)
NAME:  [name], [name]              ACCOUNT NO.:  [id number]   MEDICAL RECORD NO.:  [id number]          PATIENT TYPE:  AMB   LOCATION:  DAY                           FACILITY:  [facility]   PHYSICIAN:  Hildred Laser, M.D.    DATE OF BIRTH:  18-Apr-1951   DATE OF CONSULTATION:  DATE OF DISCHARGE:                                   CONSULTATION   REASON FOR CONSULTATION:  1.  Right lower quadrant abdominal pain.  2.  History of colonic polyp (cecal adenoma 1998).   HISTORY OF PRESENT ILLNESS:  [name] is a 60 year old African American female  who is referred through the courtesy of Dr. Isaiah Blakes for a GI evaluation and  possible colonoscopy.  She gives a several year history of right lower  quadrant abdominal pain.  She had more pain for the past two years.  She had  laparoscopy with lysis of adhesions by Dr. Isaiah Blakes.  In April 2006.  She  noted some bleed with resolution of this pain until about two months ago.  The pain described to be a pulling sensation and at times aching and like a  toothache.  It radiates posteriorly and inferiorly down into her bladder  then she urinates.  She said prior to her last laparoscopy, she would have  pain radiating to her rectum with a pulling sensation.  It seemed to be  relieved or eased with urination.  She denies hematuria or dysuria.  When  she had laparoscopy, she apparently had spots on her colon.  She has good  appetite.  She has gained 8 pounds in the last one year.  Her bowels move  daily.  She denies melena or rectal bleeding.   REVIEW OF SYSTEMS:  Negative for nausea, vomiting, fever, chills, vaginal  discharge.  She has not noted any change in her pain with meals or activity.  She complains of intermittent hot flashes.  She states nothing has ever  worked.   REVIEW OF SYSTEMS:  Positive for impaired vision in her left eye.   MEDICATIONS:  1.  Xalatan eye drops daily.  2.  Brometane eye drops both eyes b.i.d.  3.  Norvasc 5 mg daily.  4.   Hydrocodone 5/500 b.i.d. p.r.n.  5.  Folic acid 1 mg daily.  6.  Flax seed b.i.d.  7.  Vitamin B complex daily.  8.  MSN b.i.d.  9.  Vitamin C daily.  10. __________daily.  11. Zinc daily.   PAST MEDICAL HISTORY:  1.  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      She believes she has right angle glaucoma.  2.  Hypersensitive for 15 years.  3.  Sickle cell trait, Mediterranean type.  4.  G6VD deficiency.  5.  History of angina with cardiac catheterization four years ago which was      normal.  She has been having exercise tolerance test every year which      has been normal.  Old charts of just mitral valve prolapse.  6.  She had colonoscopy by Dr. Lajoyce Corners in November 1998  for rectal bleeding.  7.  She had biopsy from a normal appearing ileum which suggested illicit.      She had a small polyp removed from cecum which was an adenoma.  The      patient was advised to return for follow up exam, but she never did.  8.  Tonsillectomy in 1960.  9.  Appendectomy in 1989.  10. Tubal ligation in 1994.  11. Surgery on both feet.  She had left bunionectomy and correction for      hallux valgus deformity and removal of spurs from right foot.  12. Left cataract extraction 8 years ago.  13. Hysterectomy for cervical dysplasia and hernia.  14. Endometriosis in 2001 for cervical dysplasia.  15. History of endometriosis.  16. Laparoscopy with lysis of adhesions, particularly in the right lower      quadrant in April 2006.   ALLERGIES:  SULFA, ASA AND LATEX.   FAMILY HISTORY:  Mother has coronary artery disease and has AICD.  She also  has sickle cell trait, but without G6VD deficiency.  Father was a Health and safety inspector and died of accidental exposure at age 16.  She has two sisters  who also have sickle cell trait and G6VD deficiency and she has a brother  with diabetes mellitus.   SOCIAL HISTORY:  She is married.  She has one son.  She is presently  disabled.   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  She  quit cigarettes smoking six years ago after having smoked for 10 years, and  she drinks socially no more than 5-6 drinks a month.   PHYSICAL EXAMINATION:  GENERAL APPEARANCE:  Pleasant, mildly obese [place]  American female who is no acute distress.  VITAL SIGNS:  She weighs 195 pounds.  She is 5 feet 7 inches tall, pulse 84,  blood pressure 126/78, temperature 98.2.  HEENT:  Conjunctivae is pink.  Sclerae is nonicteric.  Oral pharyngeal  mucosa is normal.  No neck masses or thyromegaly noted.  HEART:  Regular rhythm.  Normal S1, S2.  No click or murmur noted.  LUNGS:  Clear to auscultation.  ABDOMEN:  Full.  Bowel sounds are normal.  Palpation reveals soft abdomen  with mild tenderness in right lower quadrant with some guarding.  No  organomegaly or masses noted.  RECTAL:  Deferred.  EXTREMITIES:  No peripheral edema or clubbing.   ASSESSMENT:  [name] is a 60 year old African American female with multiple  medical problems who has recurrent right lower quadrant abdominal pain.  She  has had similar pain in the past, but had complete relief following  laparoscopy with adhesiolysis.  I suspect she may have developed adhesions  again.  Although, she is having some relief with urination and pain radiates  inferiorly, she has not had any urologic symptoms in the form of dysuria and  hematuria, and she also does not have rectal bleeding.  She has history of  cecal adenoma.  She needs to have her colon surveyed to make sure she does  not have recurrent polyps.  If this study is normal, may consider abdominal  pelvic CT prior to recommending another laparoscopy.   RECOMMENDATIONS:  1.  Levsin SL one q.i.d. p.r.n. #60.  She was asked to check with her      ophthalmology, Dr. Mordecai Rasmussen, before starting this medicine.  2.  Colonoscopy will be scheduled at [facility] in the near future.  Given history     of mitral  valve prolapse, she will  be given SV prophylaxis.   We would like to thank Dr. Isaiah Blakes for the opportunity to participate in the  care of this nice lady.      Hildred Laser, M.D.  Electronically Signed     NR/MEDQ  D:  10/22/2005  T:  10/23/2005  Job:  [id number]   cc:   Benedetto Goad, MD  Fax: [phone]

## 2011-02-20 NOTE — Discharge Summary (Signed)
[place]  Patient:    Joanne, [name]                       MRN: [id number] Adm. Date:  [id number] Disc. Date: [id number] Attending:  Alla German H Dictator:   Berlin Hun, P.A. CC:         Alla German, M.D.             Dr. Legrand Rams             Dr. Algis Downs, [place]                           Discharge Summary  DATE OF BIRTH:  Oct 13, 1950  ADMISSION DIAGNOSES: 1. Atypical chest pain, rule out myocardial infarction. 2. Normal coronaries except right coronary artery spasm for catheterization    Feb 11, 2000 with normal ejection fraction. 3. Trivial myocardial infarction. 4. Remote deep venous thrombosis. 5. Hypertension. 6. G6PD deficiency. 7. Sickle cell trait. 8. Migraine headaches.  DISCHARGE DIAGNOSES: 1. Chest pain resolved with calcium channel blockers.  Myocardial infarction    ruled out with negative enzymes.  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 2. Normal coronaries except right coronary artery spasm for catheterization    Feb 11, 2000 with normal ejection fraction. 3. Trivial myocardial infarction. 4. Remote deep venous thrombosis. 5. Hypertension. 6. G6PD deficiency. 7. Sickle cell trait. 8. Migraine headaches.  HISTORY OF PRESENT ILLNESS:  This is a 60 year old [place] female patient of Dr. Reggy Eye with a history of chest pain status post cardiac catheterization Feb 11, 2000, which revealed normal coronaries except for spasm in the proximal RCA with an EF of 60%.  She also has hypertension.  A 2-D echocardiogram March 31, 2000 revealed normal LV function with an EF of 60% and trivial MI.  This morning, the patient was awoken out of sleep around 6 oclock with the episode of substernal chest pain and tightness, which was a constricting sensation in the left chest and left throat (now tender like a muscle cramp). She had discomfort down the underside of the left arm and down the left leg with tingling in her left fingers.  She  had shortness of breath and felt like she was confused and could not think clearly.  The words "werent coming out right."  She did not have any nitroglycerin at home to take.  She states she had had a similar episode on Monday.  Prior to Monday, she had mild symptoms of chest discomfort since Norvasc was discontinued one month ago (due to edema).  She says her pain today is worse with a deep breath or movement.  She presented to [facility] Room this afternoon and was given IV nitroglycerin with relief of her symptoms.  She has been sent here for further evaluation.  The patient will be admitted for atypical chest pain, rule out MI.  Coronary vasospasm.  We will start diltiazem for calcium channel blockade.  PROCEDURES:  None.  COMPLICATIONS:  None.  CONSULTATIONS:  None.  HOSPITAL COURSE:  [name] was admitted and IV heparin per pharmacy was ordered.  IV nitroglycerin was discontinued and sublingual nitroglycerins p.r.n. were ordered, Vioxx 25 mg p.o., and diltiazem 120 p.o. q.d.  EKG on admission revealed normal sinus rhythm with T wave inversions in V1-6, which is unchanged from old EKGs.  Cardiac enzymes were negative.  Laboratory studies were within normal limits. Sed rate was normal  at 21.  The patient remained pain free after making medication adjustments in the hospital.  A spiral CT was ordered to rule out PE or dissection and was negative for both.  It was felt that the patient was stable for discharge to home by Dr. Myrtice Lauth on May 06, 2000.  We suspect her discomfort was from vasospasm.  Her blood pressure was low at 88/54 and pulse rate is low at 66 and we will stop her atenolol.  DISCHARGE MEDICATIONS: 1. Diltiazem extended release 120 a day. 2. Catapres-TTS 2 patches. 3. Vioxx 25 mg a day for two weeks then as needed. 4. Prevacid 30 mg a day as needed. 5. Enteric-coated aspirin 81 mg a day. 6. Calcium 1000 mg a day. 7. Folic acid  7711 a day. 8. Stop atenolol.  ACTIVITY:  The patient will perform activity as tolerated.  She quit smoking Feb 11, 2000 and we have encouraged her to continue smoking cessation.  DIET:  Low fat/low cholesterol/low salt diet.  DISCHARGE INSTRUCTIONS:  She is to call the office with any problems or questions.  FOLLOW-UP:  We will have her see Dr. Tami Ribas back in [place] in one- to two-months time. DD:  05/06/00 TD:  05/08/00 Job: [id number]

## 2011-02-20 NOTE — Op Note (Signed)
[name], [name]              ACCOUNT NO.:  [id number]   MEDICAL RECORD NO.:  [id number]          PATIENT TYPE:  AMB   LOCATION:  DAY                           FACILITY:  [facility]   PHYSICIAN:  Benedetto Goad, MD     DATE OF BIRTH:  June 06, 1951   DATE OF PROCEDURE:  01/23/2005  DATE OF DISCHARGE:                                 OPERATIVE REPORT   PREOPERATIVE DIAGNOSIS:  This right lower quadrant pain.   POSTOPERATIVE DIAGNOSIS:  This right lower quadrant pain.   FINDINGS AT SURGERY:  Adhesive disease between omentum and anterior  abdominal wall at level of previous Pfannenstiel incision.   SURGEON:  Robina Ade. Isaiah Blakes, M.D.   PROCEDURE PERFORMED:  Laparoscopy with lysis of adhesions.   ANESTHESIA:  General endotracheal.   SPECIMENS:  None.   ESTIMATED BLOOD LOSS:  Minimal.   The patient taken to recovery room in stable condition.   SUMMARY:  Vital signs were stable.  The planned procedure again discussed  with the patient preoperatively.  She is taken to the operating room, where  she underwent uncomplicated induction of general endotracheal anesthesia.  She is placed in a dorsal lithotomy position, prepped and draped in the  usual sterile manner.  A single sponge stick is placed within the vaginal  vault, the Foley catheter placed to straight drainage, findings of clear  yellow urine.  I then changed to sterile gloves.  Standing at the patient's  left side, a 1 cm vertical incision is made just inferior to the umbilicus.  The abdominal wall is then elevated, and  a Veress needle is then passed  through the subumbilical incision perpendicular to the fascial plane to  atraumatically enter the peritoneal cavity.  Pneumoperitoneum was then  created by insufflating 3 liters of CO2 gas at a filling pressure of less  than 15 mmHg.  After assurance of adequate pneumoperitoneum, the Veress  needle is removed.  The abdominal wall is again elevated.  The 10 mm  disposable trocar and  sleeve are inserted through the subumbilical incision  angling towards the hollow of the sacrum.  This a done under direct  visualization, done atraumatically.  A proper atraumatic peritoneal entry is  performed.  This allows visualization of the pelvic contents as previously  described, most specifically fine filmy and dense adhesive disease between  omentum and anterior abdominal wall at the level of the previous  Pfannenstiel incision incorporating about from the midline and extending to  the right portion of the incision.  Total length of the adhesive condition 4  cm in length.  Direct visualization, then a second and third puncture site  are performed utilizing 5 mm trocars, both to the left of midline, first  being just suprapubically, second being slightly more lateral, about midway  in between the umbilicus and the suprapubic area.  These second and third  puncture sites were under direct visualization utilizing disposable trocars.  This confirms a proper placement of the trocars, which then easily  facilitate the operative procedure.  Under direct visualization then,  utilizing combination sharp and blunt dissection, the  adhesive disease is  completely dissected free from the anterior abdominal wall utilizing blunt  dissection as well as sharp dissection with the unipolar Endoshears.  Great  care is taken to keep the incisional area as close as possible under direct  visualization, cauterization is performed very close to the anterior  peritoneal wall.  No evidence of any hernia is identified.  This allows  complete freeing up of the adhesive disease and complete mobilization of the  adhesions identified.  The vaginal cuff is noted to be normal in appearance.  The colon is noted to be somewhat distended with gas, about twice expected  diameter, but no abnormalities are noted on the external surface of the  colon.  No other adhesive disease is identified.  The uterus, tubes and   ovaries noted to be surgically absent.  The procedure was then terminated,  gas allowed escape.  Our instruments are removed.  The three incision sites  are closed utilizing a 2-0 Vicryl deep interrupted suture, followed by skin  staples.  Total of 10 mL 0.5%  bupivacaine plain is then injected along the  entirety of the incision to facilitate postoperative analgesia.  The patient  tolerated the procedure very well.  The sponge stick is removed from the  vagina.  The Foley catheter is also removed.  The patient is reversed of  anesthesia, taken to the recovery room in stable condition.  Operative findings discussed with the patient's awaiting husband.  Appropriate photographs taken depicting the surgical procedure.   DISPOSITION:  Discussed with the patient's husband possible continued  observation overnight versus discharging the patient to home today following  the operative procedure.  Primary determining factors will be whether the  patient experiences nausea and vomiting upon awaking and the amount of  postoperative pain she experiences.  She is left the chart a prescription  for Lortab 10/500 mg #40 with no refill.  The patient had previously done  well taking p.o. Lortab for pain relief.  At time of discharge, whether that  be today or tomorrow, the patient will receive a copy of the standard  discharge instructions.  She will require follow-up in the office in one  week's time for staple removal.  Pertinent laboratory studies include a  normal CBC, normal liver function tests and none and normal electrolyte  studies.  PT and PTT are within the normal range.      DC/MEDQ  D:  01/23/2005  T:  01/23/2005  Job:  [id number]

## 2011-02-20 NOTE — Procedures (Signed)
[name], [name]              ACCOUNT NO.:  [id number]   MEDICAL RECORD NO.:  [id number]          PATIENT TYPE:  OUT   LOCATION:  RESP                          FACILITY:  [facility]   PHYSICIAN:  Edward L. Luan Pulling, M.D.DATE OF BIRTH:  October 13, 1950   DATE OF PROCEDURE:  DATE OF DISCHARGE:  08/29/2004                              PULMONARY FUNCTION TEST   FINDINGS:  1.  Spirometry is normal.  2.  Lung volumes showed normal total lung capacity.  3.  DLCO is mildly reduced.     [place]   ELH/MEDQ  D:  09/03/2004  T:  09/03/2004  Job:  [id number]   cc:   Octavia Heir, MD  [phone] [address], [place] [address]  Fax: [phone]

## 2011-02-20 NOTE — Op Note (Signed)
[place]. [facility]  Patient:    [name], [name] Visit Number: [id number] MRN: [id number]          Service Type: DSU Location: [facility] Attending Physician:  Geanie Cooley Dictated by:   Jory Ee Petrinitz, D.P.M. Admit Date:  07/28/2001                             Operative Report  PREOPERATIVE DIAGNOSIS:  Tarsal exostosis, right foot calcaneus lateral aspect.  POSTOPERATIVE DIAGNOSIS:  Tarsal exostosis, right foot calcaneus lateral aspect.  PROCEDURES PERFORMED:  Surgical excision of tarsal exostosis of calcaneus, right foot.  SURGEON:  Jory Ee. Petrinitz, D.P.M.  ANESTHESIA:  IV sedation with local.  HEMOSTASIS:  By ankle tourniquet.  ESTIMATED BLOOD LOSS:  Minimal.  INTRAVENOUS PROPHYLAXIS FOR MITRAL REGURGITATION:  1 g of Ancef IV piggyback administered prior to inflation of tourniquet.  DESCRIPTION OF PROCEDURE:  The patient was brought to the OR and placed on the table in the supine position.  An ankle tourniquet was placed superiorly.  The right foot was sufficiently padded to prevent contusion.  All precautions regarding latex allergy were taken.  Local anesthesia was administered.  Upon achievement of sedation, the right foot was prepped and draped in the usual aseptic manner.  The right foot was exsanguinated with an Esmarch bandage. The ankle tourniquet was inflated to 250 mmHg.  The following procedure was performed:  Surgical exostectomy of tarsal calcaneus, right foot.  Attention was drawn to the lateral aspect of the calcaneus.  The osseous prominence was palpable and identified.  A longitudinal incision was performed overlying the exostosis through the epidermal/dermal layers.  These were retracted and superficial fascial layer was excised.  All venous bleeders were clamped and bovied as encountered.  The periosteum and joint capsule overlying the calcaneal cuboid joint was identified.  This was incised in a  longitudinal fascia and the periosteum reflected with Sports administrator.  Further blunt and sharp tissue dissection was performed and the lateral exostosis was identified.  This was resected with a mallet and osteotome.  Further palpation revealed that the exostosis extended over the dorsum of the calcaneus near the floor of the sinus tarsi.  This was resected with the sagittal saw.  All rough edges were rasped smooth with the reciprocating rasp.  The surgical site was flushed with copious amounts of antibiotic solution and found to be free of bony spicules.  The skin was held closed and palpated.  There was found to be excellent reduction of the anatomical deformity.  No osseous prominence was felt.  The field was opened up and flushed once again.  The periosteum and capsule were then reapproximated with 3-0 Vicryl.  The deep and superficial fascial layers were reapproximated with 4-0 Vicryl.  The skin was reapproximated with 4-0 Prolene running locking suture.  The site was injected with 1 cc of dexamethasone phosphate 4 mg/ml.  The skin incision was further reinforced with Steri-Strips followed by Betadine prep, Xeroform, and a dry sterile dressing.  This was covered with Webril followed by Coban with great care for the Coban to not contact any skin.  A further Webril layer was applied followed by Ace bandage, which once again was applied with great care to not contact any skin.  The ankle tourniquet was deflated and capillary filling time was noted to return to instantaneous in all digits.  The patient tolerated the procedure and anesthesia well and  transferred to recovery room with all vital signs stable.  Dispensed detailed written and oral postoperative instructions and prescriptions.  Return office visit in four to seven days.  Advised the patient to contact myself or emergency pager service if questions or problem arise. Dictated by:   Jory Ee Petrinitz,  D.P.M. Attending Physician:  Geanie Cooley DD:  07/28/01 TD:  07/29/01 Job: [id number]

## 2011-02-20 NOTE — Op Note (Signed)
NAME:  [name], [name]              ACCOUNT NO.:  [id number]   MEDICAL RECORD NO.:  [id number]          PATIENT TYPE:  AMB   LOCATION:  DAY                           FACILITY:  [facility]   PHYSICIAN:  Hildred Laser, M.D.    DATE OF BIRTH:  Feb 27, 1951   DATE OF PROCEDURE:  11/18/2005  DATE OF DISCHARGE:                                 OPERATIVE REPORT   PROCEDURE:  Colonoscopy.   INDICATIONS:  [name] is a 60 year old Afro-American female with recurrent  right lower quadrant abdominal pain with known extensive adhesions who also  has history of colonic polyps. She had cecal adenoma removed in [place]  in 1998. She is undergoing surveillance exam. Procedure and risks were  reviewed with the patient, and informed consent was obtained.   MEDICINES FOR CONSCIOUS SEDATION:  Demerol 50 mg IV, Versed 12 mg IV in  divided dose.   FINDINGS:  Procedure performed in endoscopy suite. The patient's vital signs  and O2 saturation were monitored during the procedure and remained stable.  The patient was placed in left lateral position and rectal examination  performed. No abnormality noted on external or digital exam. Olympus  videoscope was placed in the rectum and advanced under vision into sigmoid  colon and beyond. Preparation was excellent. Scope was passed into cecum  which was identified by appendiceal stump/orifice and ileocecal valve. Short  segment of TI was examined and was normal. As the scope was withdrawn,  colonic mucosa was examined for the second time and was normal throughout.  Rectal mucosa similarly was normal. Scope was retroflexed to examine  anorectal junction, and small hemorrhoids were noted below the dentate line.  Endoscope was straightened and withdrawn. The patient tolerated the  procedure well.   FINAL DIAGNOSIS:  Small external hemorrhoids, otherwise normal colonoscopy  and terminal ileoscopy.   RECOMMENDATIONS:  Will proceed with abdominopelvic CT to complete her  workup.      Hildred Laser, M.D.  Electronically Signed     NR/MEDQ  D:  11/18/2005  T:  11/18/2005  Job:  [id number]   cc:   Isaiah Blakes, M.D.

## 2011-02-20 NOTE — Cardiovascular Report (Signed)
[place]. [facility]  Patient:    [name], [name]                       MRN: [id number] Proc. Date: 02/11/00 Adm. Date:  [id number] Disc. Date: [id number] Attending:  Alla German H                        Cardiac Catheterization  PROCEDURES: 1. Left heart catheterization. 2. Coronary angiography. 3. Left ventriculogram.  COMPLICATIONS:  None.  INDICATION:  Ms. Costanza is a 60 year old [place] female with a history of seizure disorder, hypertension, history of mitral valve prolapse, G6PD deficiency, sickle cell trait, with a remote history of DVT.  She was referred to our office for intermittent substernal chest pain with a choking sensation for the last several weeks.  She underwent stress Cardiolite in our office which revealed no evidence of ischemia.  However, she continued to have symptoms awakening her at night with  choking sensation.  She is now referred for cardiac catheterization to further define her coronary anatomy.  DESCRIPTION OF PROCEDURE:  After obtaining informed consent, the patient was brought to the cardiac catheterization room where right and left groin were shaved, prepped and draped in the usual sterile fashion.  ECG monitoring was established.  Using modified Seldinger technique, a #6 French arterial sheath was inserted in the right femoral artery.  A 6 French diagnostic catheter was then used to perform diagnostic angiography.  This revealed a medium sized left main with no significant disease.  The LAD is a medium size vessel which coursed to the apex and comprised of two diagonal branches.  The LAD has no significant disease.  The first diagonal is a medium sized vessel with no significant disease.  The second diagonal is a small vessel with no significant disease.  The left circumflex is a medium sized vessel which coursed in the AV groove and gave rise to one large obtuse marginal branch.  The AV groove  circumflex has no significant disease. he first OM is a large vessel with bifurcates in its midportion and has no significant disease.  The right coronary artery is a medium size vessel which is dominant and gives rise to both PDA as well as the posterolateral branch.  There is noted to be a spasm in the proximal and early midportion of the RCA which is relieved with intracoronary and nitroglycerin administration.  There is no significant disease in the body f the RCA, PDA, or posterolateral.  Left ventriculogram reveals preserved EF cuff at 60%.  There is significant ectopy and MR is not able to be assessed.  HEMODYNAMIC DATA: 1. Systemic arterial pressure 142/73. 2. LV systemic pressure 138/18. 3. LV EDP of 22.  CONCLUSIONS: 1. Essentially normal coronary arteries. 2. Normal left ventricular systolic function. DD:  02/11/00 TD:  02/12/00 Job: [id number]

## 2011-02-20 NOTE — H&P (Signed)
[name], [name]              ACCOUNT NO.:  [id number]   MEDICAL RECORD NO.:  [id number]          PATIENT TYPE:  AMB   LOCATION:  DAY                           FACILITY:  [facility]   PHYSICIAN:  Benedetto Goad, MD     DATE OF BIRTH:  06-13-1951   DATE OF ADMISSION:  01/23/2005  DATE OF DISCHARGE:  04/21/2006LH                                HISTORY & PHYSICAL   This is a dictation from a short for H&P which was completed at time of the  operative procedure.  The patient is a 60 year old with about a two-month  duration of fairly severe left lower quadrant pain.  The pain is present at  all times but frequently is noted to acutely increase such that the patient  finds it to be very sharp in nature.  She is taking narcotic medication for  its relief.  She has been followed closely over the past several weeks and  has had no improvement in this pain.  She has some mild nausea but no  vomiting, no diarrhea, no constipation.  She did recently have CT scan  December 01, 2004, which was completely normal.   PAST MEDICAL HISTORY:  The patient is noted to have undergone a TAH-BSO  dated November 27, 1999.  She was noted to have fibroids and some  hemorrhagic ovarian cysts at that time.  The patient subsequently was  completely pain-free until this most recent episode.  Recent history is also  pertinent for chest pain.  She has undergone complete evaluation via  [facility] in [place] to include  echocardiogram of the heart, exercise stress testing and EKG December 2005.  There was no evidence of any ischemia, and it is felt that the patient may  have some cardiac vascular spasm.  The chest pain was certainly felt to be  associated with stress, which was relieved with nitroglycerin.   MEDICATIONS:  The patient is taking an eye solution for her glaucoma,  Norvasc, Maxalt on a p.r.n. basis.  She has been off hormonal replacement  therapy x6 months' duration.  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   She states she is allergic to ASPIRIN, SULFA and LATEX.   PHYSICAL EXAMINATION:  VITAL SIGNS:  Blood pressure 151/93, pulse of 92,  weight is 186 pounds.  HEENT:  Negative.  No adenopathy.  NECK:  Supple.  Thyroid is not palpable.  CHEST:  Lungs are clear.  CARDIOVASCULAR:  Regular rate and rhythm.  ABDOMEN:  Soft and nontender.  She does have a Pfannenstiel incision from  prior hysterectomy.  PELVIC:  There is on examination of external genitalia noted to be some mild  atrophic changes secondary to lack of hormone replacement therapy.  The  anterior and posterior wall of the vagina are well-supported, no urethral  detachment.  The cuff is noted to be well-supported.  Examination of the  cuff reveals significant thickening and  tenderness in the right apex portion  of the vaginal cuff.  The left apical portion of the cuff is noted to be  palpably normal.   ASSESSMENT AND PLAN:  Significant right lower quadrant adnexal disease.  CT  scan is normal.  This likely represents possible mass effect at the vaginal  apex due to adhesive disease, which may involve large colon.  Plan on  proceeding with laparoscopic investigation of the pelvis.  Risks and  benefits of the surgical procedure discussed with the patient, to include  the risks of bowel or bladder injury.  In addition, there is a risk  associated with the use of the general anesthesia.  The patient is likewise  made aware that she very well could have some functional bowel disease and  that no abnormalities may be detected at the time of the laparoscopic  procedure.  This is to be performed as an outpatient, scheduled for January 23, 2005.      DC/MEDQ  D:  01/30/2005  T:  01/30/2005  Job:  [id number]

## 2011-03-14 ENCOUNTER — Ambulatory Visit (HOSPITAL_COMMUNITY)
Admission: RE | Admit: 2011-03-14 | Discharge: 2011-03-14 | Disposition: A | Discharge status: Home / Self Care | Payer: BC Managed Care – PPO | Source: Ambulatory Visit | Attending: Internal Medicine | Admitting: Internal Medicine

## 2011-03-14 ENCOUNTER — Other Ambulatory Visit (HOSPITAL_COMMUNITY): Payer: Self-pay | Admitting: Internal Medicine

## 2011-03-14 DIAGNOSIS — M7989 Other specified soft tissue disorders: Secondary | ICD-10-CM | POA: Insufficient documentation

## 2011-03-14 DIAGNOSIS — M19049 Primary osteoarthritis, unspecified hand: Secondary | ICD-10-CM | POA: Insufficient documentation

## 2011-03-14 DIAGNOSIS — M79609 Pain in unspecified limb: Secondary | ICD-10-CM | POA: Insufficient documentation

## 2011-06-26 LAB — CBC
HCT: 33.5 — ABNORMAL LOW
Hemoglobin: 11.8 — ABNORMAL LOW
MCHC: 35.3
MCV: 100.3 — ABNORMAL HIGH
RBC: 3.34 — ABNORMAL LOW

## 2011-06-26 LAB — BASIC METABOLIC PANEL
CO2: 26
Chloride: 101
GFR calc Af Amer: >60
Potassium: 4.1
Sodium: 135

## 2011-06-26 LAB — DIFFERENTIAL
Basophils Relative: 1
Eosinophils Absolute: 0.3
Monocytes Absolute: 0.5
Monocytes Relative: 7

## 2011-06-29 LAB — BASIC METABOLIC PANEL
BUN: 5 — ABNORMAL LOW
Calcium: 8.8
Glucose, Bld: 114 — ABNORMAL HIGH
Potassium: 3.9

## 2011-06-29 LAB — HEPATIC FUNCTION PANEL
AST: 46 — ABNORMAL HIGH
Albumin: 3.3 — ABNORMAL LOW
Alkaline Phosphatase: 42
Bilirubin, Direct: 0.2
Total Bilirubin: 0.8

## 2011-06-29 LAB — DIFFERENTIAL
Eosinophils Absolute: 0.2
Lymphs Abs: 1.6
Monocytes Absolute: 0.5
Monocytes Relative: 7
Neutro Abs: 4.2
Neutrophils Relative %: 65

## 2011-06-29 LAB — CBC
Hemoglobin: 10.1 — ABNORMAL LOW
MCV: 100.4 — ABNORMAL HIGH
RBC: 2.85 — ABNORMAL LOW
WBC: 6.5

## 2011-09-09 ENCOUNTER — Other Ambulatory Visit (HOSPITAL_COMMUNITY): Payer: Self-pay | Admitting: Internal Medicine

## 2011-09-09 DIAGNOSIS — Z139 Encounter for screening, unspecified: Secondary | ICD-10-CM

## 2011-09-14 ENCOUNTER — Encounter: Payer: Self-pay | Admitting: Internal Medicine

## 2011-09-15 ENCOUNTER — Ambulatory Visit (INDEPENDENT_AMBULATORY_CARE_PROVIDER_SITE_OTHER): Payer: BC Managed Care – PPO | Admitting: Urgent Care

## 2011-09-15 ENCOUNTER — Encounter: Payer: Self-pay | Admitting: Urgent Care

## 2011-09-15 DIAGNOSIS — Z800 Family history of malignant neoplasm of digestive organs: Secondary | ICD-10-CM | POA: Insufficient documentation

## 2011-09-15 DIAGNOSIS — R634 Abnormal weight loss: Secondary | ICD-10-CM | POA: Insufficient documentation

## 2011-09-15 DIAGNOSIS — K589 Irritable bowel syndrome without diarrhea: Secondary | ICD-10-CM

## 2011-09-15 DIAGNOSIS — R1011 Right upper quadrant pain: Secondary | ICD-10-CM

## 2011-09-15 DIAGNOSIS — Z809 Family history of malignant neoplasm, unspecified: Secondary | ICD-10-CM

## 2011-09-15 DIAGNOSIS — D126 Benign neoplasm of colon, unspecified: Secondary | ICD-10-CM

## 2011-09-15 LAB — COMPREHENSIVE METABOLIC PANEL
AST: 30 U/L (ref 0–37)
Alkaline Phosphatase: 50 U/L (ref 39–117)
BUN: 21 mg/dL (ref 6–23)
Calcium: 10.8 mg/dL — ABNORMAL HIGH (ref 8.4–10.5)
Creat: 0.92 mg/dL (ref 0.50–1.10)
Total Bilirubin: 1 mg/dL (ref 0.3–1.2)

## 2011-09-15 LAB — CBC WITH DIFFERENTIAL/PLATELET
Basophils Absolute: 0.1 10*3/uL (ref 0.0–0.1)
Basophils Relative: 1 % (ref 0–1)
Eosinophils Absolute: 0.2 10*3/uL (ref 0.0–0.7)
Eosinophils Relative: 4 % (ref 0–5)
HCT: 37.3 % (ref 36.0–46.0)
MCHC: 34.6 g/dL (ref 30.0–36.0)
MCV: 97.9 fL (ref 78.0–100.0)
Monocytes Absolute: 0.5 10*3/uL (ref 0.1–1.0)
RDW: 12 % (ref 11.5–15.5)

## 2011-09-15 NOTE — Patient Instructions (Addendum)
To ER if you have severe abdominal pain Begin Dexilant 60 mg daily Followup with your neurologist appointment regarding your right sided cramps and swallowing problems Go get your labs today, I will call you with results and next step. You will need upper endoscopy (EGD) for weight loss & pain. Avoid aspirin, Aleve, ibuprofen or other anti-inflammatories

## 2011-09-16 ENCOUNTER — Telehealth: Payer: Self-pay | Admitting: Gastroenterology

## 2011-09-16 ENCOUNTER — Other Ambulatory Visit: Payer: Self-pay | Admitting: Gastroenterology

## 2011-09-16 ENCOUNTER — Ambulatory Visit (HOSPITAL_COMMUNITY)
Admission: RE | Admit: 2011-09-16 | Discharge: 2011-09-16 | Disposition: A | Discharge status: Home / Self Care | Payer: BC Managed Care – PPO | Source: Ambulatory Visit | Attending: Urgent Care | Admitting: Urgent Care

## 2011-09-16 ENCOUNTER — Encounter: Payer: Self-pay | Admitting: Urgent Care

## 2011-09-16 DIAGNOSIS — K7689 Other specified diseases of liver: Secondary | ICD-10-CM | POA: Insufficient documentation

## 2011-09-16 DIAGNOSIS — R748 Abnormal levels of other serum enzymes: Secondary | ICD-10-CM | POA: Insufficient documentation

## 2011-09-16 DIAGNOSIS — R109 Unspecified abdominal pain: Secondary | ICD-10-CM | POA: Insufficient documentation

## 2011-09-16 DIAGNOSIS — R634 Abnormal weight loss: Secondary | ICD-10-CM

## 2011-09-16 DIAGNOSIS — R1011 Right upper quadrant pain: Secondary | ICD-10-CM

## 2011-09-16 LAB — URINALYSIS W MICROSCOPIC + REFLEX CULTURE
Bilirubin Urine: NEGATIVE
Crystals: NONE SEEN
Specific Gravity, Urine: 1.014 (ref 1.005–1.030)
Squamous Epithelial / LPF: NONE SEEN
Urobilinogen, UA: 0.2 mg/dL (ref 0.0–1.0)

## 2011-09-16 MED ORDER — IOHEXOL 300 MG/ML  SOLN
100.0000 mL | Freq: Once | INTRAMUSCULAR | Status: AC | PRN
  Administered 2011-09-16: 100 mL via INTRAVENOUS

## 2011-09-16 NOTE — Progress Notes (Signed)
Results Cc to PCP  

## 2011-09-16 NOTE — Progress Notes (Signed)
Quick Note:  Please call patient She had a benign cyst in her liver Nothing to explain her pain. She needs EGD with Dr. Gala Romney reason: Weight loss, abdominal pain, early satiety and anorexia Please arrange Thanks WY:SORTQ,SYHNPMV, MD     ______

## 2011-09-16 NOTE — Progress Notes (Signed)
Quick Note:  Please call pt. Her amylase is elevated. We need CT A/P w/ IV/oral contrast re: wt loss, severe abd pain, elevated amylase today. Her calcium is also just above normal. She should call PCP to discuss this & have it repeated to confirm. Cc: FANTA,TESFAYE, MD Thanks ______

## 2011-09-16 NOTE — Progress Notes (Signed)
Primary Care Physician:  Rosita Fire, MD Primary Gastroenterologist:  Dr. Oneida Alar  Chief Complaint  Patient presents with  . Abdominal Pain    right side/wt loss   HPI:  [name] is a 60 y.o. female here for abdominal pain.  Hx chronic epigastric pain/RUQ , worse of past yr.  Extreme symptoms since past 3-6 wks.  Pain worse while preparing meals or after eating.  C/o regurgitation.  "Toothache" pain 2/10 now, waxes & wanes, 10/10 @ worst.  Feels just like before cholecystectomy.  C/o anorexia.  Tried alka seltzers 2 per day-seems to help some.  Not on PPI because she felt no help.  BM "fluffy" 3 per day.  Denies rectal bleeding or melena.  Failed nexium, prevacid & prilosec previously.  Pt c/o bloating postprandially & early satiety.  Husband has stage 4 prostate ca.  Pt very anxious & "stressed" with husband's illness.  Documented wt loss of 21# IN 60moper our records.  Past Medical History  Diagnosis Date  . Chronic RUQ pain 2009    EUS slightly dilated CBD (7.553m, otherwise nl  . HTN (hypertension)   . Hypercholesteremia   . Glaucoma   . Mitral valve prolapse   . IBS (irritable bowel syndrome)   . Sickle cell trait   . G6PD deficiency   . Kidney stone   . Renal cyst     Past Surgical History  Procedure Date  . Cholecystectomy 12/2007  . Complete hysterectomy   . Appendectomy   . Cataract extraction   . Angioplasty   . Colonoscopy 12/22/10    normal, cecal polyp cold bx  . Laparoscopy     adhesions    Current Outpatient Prescriptions  Medication Sig Dispense Refill  . brimonidine (ALPHAGAN) 0.15 % ophthalmic solution 1 drop 3 (three) times daily.        . irbesartan-hydrochlorothiazide (AVALIDE) 150-12.5 MG per tablet Take 1 tablet by mouth daily.       . lansoprazole (PREVACID) 30 MG capsule Take 30 mg by mouth daily.        . Marland Kitchenatanoprost (XALATAN) 0.005 % ophthalmic solution 1 drop at bedtime.        . Marland Kitchenolpidem (AMBIEN) 10 MG tablet Take 10 mg by mouth at  bedtime as needed.         Allergies as of 09/15/2011 - Review Complete 09/15/2011  Allergen Reaction Noted  . Sulfonamide derivatives  12/10/2010   Review of Systems: Gen: Denies any fever, chills, sweats, anorexia, fatigue, weakness, malaise, weight loss, and sleep disorder CV: Denies chest pain, angina, palpitations, syncope, orthopnea, PND, peripheral edema, and claudication. Resp: Denies dyspnea at rest, dyspnea with exercise, cough, sputum, wheezing, coughing up blood, and pleurisy. GI: Denies vomiting blood, jaundice, and fecal incontinence.   Denies dysphagia or odynophagia. Derm: Denies rash, itching, dry skin, hives, moles, warts, or unhealing ulcers.  Psych: Anxiety.  Denies depression, memory loss, suicidal ideation, hallucinations, paranoia, and confusion. Heme: Denies bruising, bleeding, and enlarged lymph nodes. MSK:  Cramps in jaw, right side, right extremities. Appt with neurology pending.  Physical Exam: BP 112/72  Pulse 81  Temp(Src) 98.3 F (36.8 C) (Temporal)  Ht 5' 6"  (1.676 m)  Wt 177 lb 6.4 oz (80.468 kg)  BMI 28.63 kg/m2 General:   Alert,  Well-developed, well-nourished, pleasant and cooperative in NAD.  Accompanied by her husband.   Eyes:  Sclera clear, no icterus.   Conjunctiva pink. Mouth:  No deformity or lesions, oropharynx pink and moist. Neck:  Supple; no masses or thyromegaly. Heart:  Regular rate and rhythm; no murmurs, clicks, rubs,  or gallops. Abdomen:  Normal bowel sounds.  No bruits.  Soft, non-distended, +upper abd tenderness on palpation, without masses, hepatosplenomegaly or hernias noted.  No guarding or rebound tenderness.   Rectal:  Deferred. Msk:  Symmetrical withounormt gross deformities.  Pulses:  Normal pulses noted. Extremities:  No clubbing or edema. Neurologic:  Alert and oriented x4;  grossly normal neurologically. Skin:  Intact without significant lesions or rashes.

## 2011-09-16 NOTE — Telephone Encounter (Signed)
Pt called asking to reschedule her procedure to 12/27 - moved

## 2011-09-17 DIAGNOSIS — D126 Benign neoplasm of colon, unspecified: Secondary | ICD-10-CM

## 2011-09-17 HISTORY — DX: Benign neoplasm of colon, unspecified: D12.6

## 2011-09-17 NOTE — Telephone Encounter (Signed)
Pt moved to [facility] schedule on 09/22/11

## 2011-09-17 NOTE — Progress Notes (Signed)
Cc to PCP 

## 2011-09-17 NOTE — Assessment & Plan Note (Addendum)
[name] is a pleasant 60 y.o. female with acute on chronic upper abdominal pain, mostly right upper quadrant. Also complains of anorexia and early satiety. We haven't documented significant weight loss. She is under a lot of stress with her husband having stage IV prostate cancer.  Differentials include pancreatitis, peptic ulcer disease, H. pylori gastritis, or nonulcer dyspepsia.   To ER if you have severe abdominal pain Dexilant 60 mg daily Followup with your neurologist appointment regarding your right sided cramps and swallowing problems Avoid aspirin, Aleve, ibuprofen or other anti-inflammatories  CT A/P showed no cause of abdominal pain.  Plan for EGD with Dr. Oneida Alar in the near future.  I have discussed risks & benefits which include, but are not limited to, bleeding, infection, perforation & drug reaction.  The patient agrees with this plan & written consent will be obtained.

## 2011-09-17 NOTE — Assessment & Plan Note (Signed)
At her baseline.

## 2011-09-17 NOTE — Telephone Encounter (Signed)
Spoke w/ pt.  Pt is [facility] pt.  Please change EGD to [facility].  Primary GI changed to [facility] per Manuela Schwartz in [facility].

## 2011-09-17 NOTE — Assessment & Plan Note (Deleted)
Next colonoscopy 02/2016 per Dr. Roseanne Kaufman recommendations.

## 2011-09-17 NOTE — Assessment & Plan Note (Signed)
EGD for further evaluation. See RUQ pain.

## 2011-09-18 ENCOUNTER — Encounter (HOSPITAL_COMMUNITY): Payer: Self-pay | Admitting: Pharmacy Technician

## 2011-09-21 MED ORDER — SODIUM CHLORIDE 0.45 % IV SOLN
Freq: Once | INTRAVENOUS | Status: AC
  Administered 2011-09-22: 1000 mL via INTRAVENOUS

## 2011-09-22 ENCOUNTER — Other Ambulatory Visit: Payer: Self-pay | Admitting: Gastroenterology

## 2011-09-22 ENCOUNTER — Encounter (HOSPITAL_COMMUNITY): Payer: Self-pay | Admitting: *Deleted

## 2011-09-22 ENCOUNTER — Telehealth: Payer: Self-pay | Admitting: Gastroenterology

## 2011-09-22 ENCOUNTER — Encounter (HOSPITAL_COMMUNITY)
Admission: RE | Disposition: A | Discharge status: Home / Self Care | Payer: Self-pay | Source: Ambulatory Visit | Attending: Gastroenterology

## 2011-09-22 ENCOUNTER — Ambulatory Visit (HOSPITAL_COMMUNITY)
Admission: RE | Admit: 2011-09-22 | Discharge: 2011-09-22 | Disposition: A | Discharge status: Home / Self Care | Payer: BC Managed Care – PPO | Source: Ambulatory Visit | Attending: Gastroenterology | Admitting: Gastroenterology

## 2011-09-22 DIAGNOSIS — R1013 Epigastric pain: Secondary | ICD-10-CM | POA: Insufficient documentation

## 2011-09-22 DIAGNOSIS — Z79899 Other long term (current) drug therapy: Secondary | ICD-10-CM | POA: Insufficient documentation

## 2011-09-22 DIAGNOSIS — K2940 Chronic atrophic gastritis without bleeding: Secondary | ICD-10-CM | POA: Insufficient documentation

## 2011-09-22 DIAGNOSIS — R1011 Right upper quadrant pain: Secondary | ICD-10-CM

## 2011-09-22 DIAGNOSIS — K319 Disease of stomach and duodenum, unspecified: Secondary | ICD-10-CM | POA: Insufficient documentation

## 2011-09-22 DIAGNOSIS — R634 Abnormal weight loss: Secondary | ICD-10-CM

## 2011-09-22 DIAGNOSIS — R109 Unspecified abdominal pain: Secondary | ICD-10-CM

## 2011-09-22 DIAGNOSIS — K2980 Duodenitis without bleeding: Secondary | ICD-10-CM | POA: Insufficient documentation

## 2011-09-22 DIAGNOSIS — I10 Essential (primary) hypertension: Secondary | ICD-10-CM | POA: Insufficient documentation

## 2011-09-22 DIAGNOSIS — K2970 Gastritis, unspecified, without bleeding: Secondary | ICD-10-CM

## 2011-09-22 DIAGNOSIS — K2990 Gastroduodenitis, unspecified, without bleeding: Secondary | ICD-10-CM

## 2011-09-22 HISTORY — PX: ESOPHAGOGASTRODUODENOSCOPY: SHX5428

## 2011-09-22 SURGERY — EGD (ESOPHAGOGASTRODUODENOSCOPY)
Anesthesia: Moderate Sedation

## 2011-09-22 MED ORDER — PROMETHAZINE HCL 25 MG/ML IJ SOLN
INTRAMUSCULAR | Status: DC | PRN
  Administered 2011-09-22 (×2): 12.5 mg via INTRAVENOUS

## 2011-09-22 MED ORDER — MIDAZOLAM HCL 5 MG/5ML IJ SOLN
INTRAMUSCULAR | Status: AC
  Filled 2011-09-22: qty 10

## 2011-09-22 MED ORDER — PROMETHAZINE HCL 25 MG/ML IJ SOLN
INTRAMUSCULAR | Status: AC
  Filled 2011-09-22: qty 1

## 2011-09-22 MED ORDER — MEPERIDINE HCL 100 MG/ML IJ SOLN
INTRAMUSCULAR | Status: DC | PRN
  Administered 2011-09-22: 25 mg via INTRAVENOUS
  Administered 2011-09-22 (×2): 50 mg via INTRAVENOUS

## 2011-09-22 MED ORDER — MEPERIDINE HCL 100 MG/ML IJ SOLN
INTRAMUSCULAR | Status: AC
  Filled 2011-09-22: qty 1

## 2011-09-22 MED ORDER — MIDAZOLAM HCL 5 MG/5ML IJ SOLN
INTRAMUSCULAR | Status: DC | PRN
  Administered 2011-09-22 (×3): 2 mg via INTRAVENOUS

## 2011-09-22 MED ORDER — STERILE WATER FOR IRRIGATION IR SOLN
Status: DC | PRN
  Administered 2011-09-22: 14:00:00

## 2011-09-22 NOTE — Interval H&P Note (Signed)
History and Physical Interval Note:  09/22/2011 1:44 PM  [name]  has presented today for surgery, with the diagnosis of wt loss, abd pain  The various methods of treatment have been discussed with the patient and family. After consideration of risks, benefits and other options for treatment, the patient has consented to  Procedure(s): ESOPHAGOGASTRODUODENOSCOPY (EGD) as a surgical intervention .  The patients' history has been reviewed, patient examined, no change in status, stable for surgery.  I have reviewed the patients' chart and labs.  Questions were answered to the patient's satisfaction.     [employer]

## 2011-09-22 NOTE — H&P (View-Only) (Signed)
Primary Care Physician:  Rosita Fire, MD Primary Gastroenterologist:  Dr. Oneida Alar  Chief Complaint  Patient presents with  . Abdominal Pain    right side/wt loss   HPI:  [name] is a 60 y.o. female here for abdominal pain.  Hx chronic epigastric pain/RUQ , worse of past yr.  Extreme symptoms since past 3-6 wks.  Pain worse while preparing meals or after eating.  C/o regurgitation.  "Toothache" pain 2/10 now, waxes & wanes, 10/10 @ worst.  Feels just like before cholecystectomy.  C/o anorexia.  Tried alka seltzers 2 per day-seems to help some.  Not on PPI because she felt no help.  BM "fluffy" 3 per day.  Denies rectal bleeding or melena.  Failed nexium, prevacid & prilosec previously.  Pt c/o bloating postprandially & early satiety.  Husband has stage 4 prostate ca.  Pt very anxious & "stressed" with husband's illness.  Documented wt loss of 21# IN 12moper our records.  Past Medical History  Diagnosis Date  . Chronic RUQ pain 2009    EUS slightly dilated CBD (7.539m, otherwise nl  . HTN (hypertension)   . Hypercholesteremia   . Glaucoma   . Mitral valve prolapse   . IBS (irritable bowel syndrome)   . Sickle cell trait   . G6PD deficiency   . Kidney stone   . Renal cyst     Past Surgical History  Procedure Date  . Cholecystectomy 12/2007  . Complete hysterectomy   . Appendectomy   . Cataract extraction   . Angioplasty   . Colonoscopy 12/22/10    normal, cecal polyp cold bx  . Laparoscopy     adhesions    Current Outpatient Prescriptions  Medication Sig Dispense Refill  . brimonidine (ALPHAGAN) 0.15 % ophthalmic solution 1 drop 3 (three) times daily.        . irbesartan-hydrochlorothiazide (AVALIDE) 150-12.5 MG per tablet Take 1 tablet by mouth daily.       . lansoprazole (PREVACID) 30 MG capsule Take 30 mg by mouth daily.        . Marland Kitchenatanoprost (XALATAN) 0.005 % ophthalmic solution 1 drop at bedtime.        . Marland Kitchenolpidem (AMBIEN) 10 MG tablet Take 10 mg by mouth at  bedtime as needed.         Allergies as of 09/15/2011 - Review Complete 09/15/2011  Allergen Reaction Noted  . Sulfonamide derivatives  12/10/2010   Review of Systems: Gen: Denies any fever, chills, sweats, anorexia, fatigue, weakness, malaise, weight loss, and sleep disorder CV: Denies chest pain, angina, palpitations, syncope, orthopnea, PND, peripheral edema, and claudication. Resp: Denies dyspnea at rest, dyspnea with exercise, cough, sputum, wheezing, coughing up blood, and pleurisy. GI: Denies vomiting blood, jaundice, and fecal incontinence.   Denies dysphagia or odynophagia. Derm: Denies rash, itching, dry skin, hives, moles, warts, or unhealing ulcers.  Psych: Anxiety.  Denies depression, memory loss, suicidal ideation, hallucinations, paranoia, and confusion. Heme: Denies bruising, bleeding, and enlarged lymph nodes. MSK:  Cramps in jaw, right side, right extremities. Appt with neurology pending.  Physical Exam: BP 112/72  Pulse 81  Temp(Src) 98.3 F (36.8 C) (Temporal)  Ht 5' 6"  (1.676 m)  Wt 177 lb 6.4 oz (80.468 kg)  BMI 28.63 kg/m2 General:   Alert,  Well-developed, well-nourished, pleasant and cooperative in NAD.  Accompanied by her husband.   Eyes:  Sclera clear, no icterus.   Conjunctiva pink. Mouth:  No deformity or lesions, oropharynx pink and moist. Neck:  Supple; no masses or thyromegaly. Heart:  Regular rate and rhythm; no murmurs, clicks, rubs,  or gallops. Abdomen:  Normal bowel sounds.  No bruits.  Soft, non-distended, +upper abd tenderness on palpation, without masses, hepatosplenomegaly or hernias noted.  No guarding or rebound tenderness.   Rectal:  Deferred. Msk:  Symmetrical withounormt gross deformities.  Pulses:  Normal pulses noted. Extremities:  No clubbing or edema. Neurologic:  Alert and oriented x4;  grossly normal neurologically. Skin:  Intact without significant lesions or rashes.

## 2011-09-22 NOTE — Telephone Encounter (Signed)
Referral faxed to Dr Merlene Laughter for pain management of chest wall pain at request of [facility]

## 2011-09-22 NOTE — Op Note (Signed)
[facility] [address] [place], [place]  [address]  ENDOSCOPY PROCEDURE REPORT  PATIENT:  [name], [name]  MR#:  [id number] BIRTHDATE:  [birth year], 48 yrs. old  GENDER:  female  ENDOSCOPIST:  Barney Drain, MD Referred by:  Conni Slipper, M.D.  PROCEDURE DATE:  09/22/2011 PROCEDURE:  EGD with biopsy, 43239 ASA CLASS: INDICATIONS:  ABDOMINAL PAIN WEIGHT LOSS CHANGE IN BOWEL HABITS FHL:[id number] RUQ PAIN  MEDICATIONS:   Demerol 125 mg IV, Versed 6 mg IV, promethazine (Phenergan) 25 mg IV TOPICAL ANESTHETIC:  Cetacaine Spray  DESCRIPTION OF PROCEDURE:     Physical exam was performed. Informed consent was obtained from the patient after explaining the benefits, risks, and alternatives to the procedure.  The patient was connected to the monitor and placed in the left lateral position.  Continuous oxygen was provided by nasal cannula and IV medicine administered through an indwelling cannula.  After administration of sedation, the patient's esophagus was intubated and the EG-[id number] ([id number]) endoscope was advanced under direct visualization to the second portion of the duodenum.  The scope was removed slowly by carefully examining the color, texture, anatomy, and integrity of the mucosa on the way out.  The patient was recovered in endoscopy and discharged home in satisfactory condition. <<PROCEDUREIMAGES>>  Mild gastritis was found.  Duodenitis was found. COMPLICATIONS:    None  ENDOSCOPIC IMPRESSION: 1) Mild gastritis 2) Duodenitis, MILD RECOMMENDATIONS: CONTINUE DEXILANT. ADD OTC IBUOROFEN 2 PO TID FOR 7 DAYS. AWAIT BIOPSIES PAIN CLINIC REFERRAL FOR CHEST WALL INJECTION FOLLOW UP IN 4 MOS  REPEAT EXAM:  No  ______________________________ Barney Drain, MD  CC:  n. eSIGNED:   Kayon Dozier at 09/22/2011 02:29 PM  Juline Patch, [phone]

## 2011-10-01 ENCOUNTER — Other Ambulatory Visit: Payer: Self-pay

## 2011-10-01 ENCOUNTER — Telehealth: Payer: Self-pay

## 2011-10-01 DIAGNOSIS — R197 Diarrhea, unspecified: Secondary | ICD-10-CM

## 2011-10-01 NOTE — Telephone Encounter (Signed)
No evidence of celiac on small bowel biopsies. She has referral for right sided chest wall pain treatment in process per [facility].   Recommend stool for culture, CDiff, Giardia/crypto.  TSH.  Add probiotic such as philips colon health or align. One daily.

## 2011-10-01 NOTE — Telephone Encounter (Signed)
Pt called and said she had EGD last week. She still feels very bloated by the end of the day. Gets a low grade temp as 99.5, under 100 though. She has diarrhea a lot and had about 10 times yesterday. This AM she had a more firmed stool and then had a diarrhea episode x 1 this AM thus far. Said she just feels tired and not well. Her right side aches. Michela Pitcher she has no appendix or gall bladder) Please advise!

## 2011-10-01 NOTE — Progress Notes (Signed)
Opened in error

## 2011-10-01 NOTE — Telephone Encounter (Signed)
Called and informed pt. She will come by office for the stool bottles/ #14 samples of Align and order for TSH and stools.

## 2011-10-02 LAB — TSH: TSH: 1.652 u[IU]/mL (ref 0.350–4.500)

## 2011-10-02 LAB — GIARDIA/CRYPTOSPORIDIUM (EIA): Giardia Screen (EIA): NEGATIVE

## 2011-10-02 NOTE — Telephone Encounter (Signed)
Quick Note:  Await other stools. ______

## 2011-10-05 ENCOUNTER — Telehealth: Payer: Self-pay | Admitting: Gastroenterology

## 2011-10-05 ENCOUNTER — Other Ambulatory Visit: Payer: Self-pay | Admitting: Neurology

## 2011-10-05 ENCOUNTER — Encounter (HOSPITAL_COMMUNITY): Payer: Self-pay | Admitting: Gastroenterology

## 2011-10-05 ENCOUNTER — Ambulatory Visit (HOSPITAL_COMMUNITY)
Admission: RE | Admit: 2011-10-05 | Discharge: 2011-10-05 | Disposition: A | Discharge status: Home / Self Care | Payer: BC Managed Care – PPO | Source: Ambulatory Visit | Attending: Internal Medicine | Admitting: Internal Medicine

## 2011-10-05 DIAGNOSIS — R531 Weakness: Secondary | ICD-10-CM

## 2011-10-05 DIAGNOSIS — Z139 Encounter for screening, unspecified: Secondary | ICD-10-CM

## 2011-10-05 DIAGNOSIS — Z1231 Encounter for screening mammogram for malignant neoplasm of breast: Secondary | ICD-10-CM | POA: Insufficient documentation

## 2011-10-05 LAB — STOOL CULTURE

## 2011-10-05 NOTE — Telephone Encounter (Signed)
PLEASE CALL PT. HER stomach Bx shows mild gastritis.  Her SMALL BOWEL BIOPSIES Bx are normal. HER THYROID TEST AND STOOL STUDIES ARE ALL NORMAL. HER RIGHT UPPER QUADRANT PAIN IS MOST LIKEY DUE TO IBS AND CHEST WALL(RIB) PAIN IN LIGHT OF THESE FINDING AND YOUR NORMAL LIVER ENZYMES & NO SIGNIFICANT FINDINGS ON CT SCAN.  We will proceed with a hydrogen breath test FOR SMALL INTESTINE BACTERIAL OVERGROWTH, which can cause abdominal pain and bloating. She will need a referral to [facility], Or [facility] if the test is nl and she is still having pain. FOLLOW UP WITH THE PAIN SPECIALIST. CONTINUE DEXILANT. FOLLOW UP  IN April 2013.

## 2011-10-05 NOTE — Telephone Encounter (Signed)
Wants Pathology results & stool results & labs results please advise call her at home first if no answer then the cell [phone]?

## 2011-10-07 ENCOUNTER — Other Ambulatory Visit: Payer: Self-pay | Admitting: Gastroenterology

## 2011-10-07 DIAGNOSIS — R109 Unspecified abdominal pain: Secondary | ICD-10-CM

## 2011-10-07 DIAGNOSIS — R140 Abdominal distension (gaseous): Secondary | ICD-10-CM

## 2011-10-07 NOTE — Telephone Encounter (Signed)
HBT scheduled for 10/20/11 @ 7:30- Instructions mailed

## 2011-10-07 NOTE — Telephone Encounter (Signed)
Pt is aware of OV on 4/18 at 9 with KJ

## 2011-10-07 NOTE — Progress Notes (Signed)
EGD MILD GASTRITIS DUO BX NL NEEDS HBT FOR SIBO

## 2011-10-07 NOTE — Telephone Encounter (Signed)
Pt was informed.

## 2011-10-15 ENCOUNTER — Ambulatory Visit (HOSPITAL_COMMUNITY)
Admission: RE | Admit: 2011-10-15 | Discharge: 2011-10-15 | Disposition: A | Discharge status: Home / Self Care | Payer: BC Managed Care – PPO | Source: Ambulatory Visit | Attending: Neurology | Admitting: Neurology

## 2011-10-15 ENCOUNTER — Telehealth: Payer: Self-pay | Admitting: Gastroenterology

## 2011-10-15 DIAGNOSIS — R209 Unspecified disturbances of skin sensation: Secondary | ICD-10-CM | POA: Insufficient documentation

## 2011-10-15 DIAGNOSIS — R51 Headache: Secondary | ICD-10-CM | POA: Insufficient documentation

## 2011-10-15 DIAGNOSIS — R531 Weakness: Secondary | ICD-10-CM

## 2011-10-15 MED ORDER — GADOBENATE DIMEGLUMINE 529 MG/ML IV SOLN
15.0000 mL | Freq: Once | INTRAVENOUS | Status: AC | PRN
  Administered 2011-10-15: 15 mL via INTRAVENOUS

## 2011-10-15 NOTE — Telephone Encounter (Signed)
Pt called to cancel HBT at this time- husbands cancer has returned- she is feeling much better- she will call back when she is ready to reschedule

## 2011-10-15 NOTE — Telephone Encounter (Signed)
REVIEWED.  

## 2011-10-20 ENCOUNTER — Ambulatory Visit (HOSPITAL_COMMUNITY)
Admission: RE | Admit: 2011-10-20 | Payer: BC Managed Care – PPO | Source: Ambulatory Visit | Admitting: Gastroenterology

## 2011-10-20 ENCOUNTER — Encounter (HOSPITAL_COMMUNITY): Admission: RE | Payer: Self-pay | Source: Ambulatory Visit

## 2011-10-20 SURGERY — HYDROGEN BREATH TEST
Anesthesia: LOCAL

## 2011-12-13 ENCOUNTER — Other Ambulatory Visit: Payer: Self-pay

## 2011-12-13 ENCOUNTER — Encounter (HOSPITAL_COMMUNITY): Payer: Self-pay

## 2011-12-13 ENCOUNTER — Emergency Department (HOSPITAL_COMMUNITY): Payer: BC Managed Care – PPO

## 2011-12-13 ENCOUNTER — Emergency Department (HOSPITAL_COMMUNITY)
Admission: EM | Admit: 2011-12-13 | Discharge: 2011-12-14 | Disposition: A | Discharge status: Home / Self Care | Payer: BC Managed Care – PPO | Attending: Emergency Medicine | Admitting: Emergency Medicine

## 2011-12-13 DIAGNOSIS — F419 Anxiety disorder, unspecified: Secondary | ICD-10-CM

## 2011-12-13 DIAGNOSIS — Z800 Family history of malignant neoplasm of digestive organs: Secondary | ICD-10-CM | POA: Insufficient documentation

## 2011-12-13 DIAGNOSIS — R1011 Right upper quadrant pain: Secondary | ICD-10-CM | POA: Insufficient documentation

## 2011-12-13 DIAGNOSIS — K589 Irritable bowel syndrome without diarrhea: Secondary | ICD-10-CM | POA: Insufficient documentation

## 2011-12-13 DIAGNOSIS — D573 Sickle-cell trait: Secondary | ICD-10-CM | POA: Insufficient documentation

## 2011-12-13 DIAGNOSIS — H409 Unspecified glaucoma: Secondary | ICD-10-CM | POA: Insufficient documentation

## 2011-12-13 DIAGNOSIS — G8929 Other chronic pain: Secondary | ICD-10-CM | POA: Insufficient documentation

## 2011-12-13 DIAGNOSIS — F1021 Alcohol dependence, in remission: Secondary | ICD-10-CM | POA: Insufficient documentation

## 2011-12-13 DIAGNOSIS — I498 Other specified cardiac arrhythmias: Secondary | ICD-10-CM | POA: Insufficient documentation

## 2011-12-13 DIAGNOSIS — I059 Rheumatic mitral valve disease, unspecified: Secondary | ICD-10-CM | POA: Insufficient documentation

## 2011-12-13 DIAGNOSIS — I10 Essential (primary) hypertension: Secondary | ICD-10-CM | POA: Insufficient documentation

## 2011-12-13 DIAGNOSIS — R0989 Other specified symptoms and signs involving the circulatory and respiratory systems: Secondary | ICD-10-CM | POA: Insufficient documentation

## 2011-12-13 DIAGNOSIS — Z9889 Other specified postprocedural states: Secondary | ICD-10-CM | POA: Insufficient documentation

## 2011-12-13 DIAGNOSIS — F411 Generalized anxiety disorder: Secondary | ICD-10-CM | POA: Insufficient documentation

## 2011-12-13 DIAGNOSIS — R0609 Other forms of dyspnea: Secondary | ICD-10-CM | POA: Insufficient documentation

## 2011-12-13 DIAGNOSIS — Z87891 Personal history of nicotine dependence: Secondary | ICD-10-CM | POA: Insufficient documentation

## 2011-12-13 DIAGNOSIS — Z87442 Personal history of urinary calculi: Secondary | ICD-10-CM | POA: Insufficient documentation

## 2011-12-13 DIAGNOSIS — R0789 Other chest pain: Secondary | ICD-10-CM | POA: Insufficient documentation

## 2011-12-13 DIAGNOSIS — R209 Unspecified disturbances of skin sensation: Secondary | ICD-10-CM | POA: Insufficient documentation

## 2011-12-13 DIAGNOSIS — Z9079 Acquired absence of other genital organ(s): Secondary | ICD-10-CM | POA: Insufficient documentation

## 2011-12-13 LAB — BASIC METABOLIC PANEL
BUN: 14 mg/dL (ref 6–23)
CO2: 25 mEq/L (ref 19–32)
Chloride: 99 mEq/L (ref 96–112)
Creatinine, Ser: 0.92 mg/dL (ref 0.50–1.10)

## 2011-12-13 LAB — CBC
HCT: 37.3 % (ref 36.0–46.0)
Hemoglobin: 13 g/dL (ref 12.0–15.0)
MCHC: 34.9 g/dL (ref 30.0–36.0)
MCV: 97.4 fL (ref 78.0–100.0)

## 2011-12-13 LAB — DIFFERENTIAL
Basophils Relative: 0 % (ref 0–1)
Lymphocytes Relative: 37 % (ref 12–46)
Monocytes Absolute: 0.6 10*3/uL (ref 0.1–1.0)
Monocytes Relative: 8 % (ref 3–12)
Neutro Abs: 4 10*3/uL (ref 1.7–7.7)

## 2011-12-13 MED ORDER — ONDANSETRON HCL 4 MG/2ML IJ SOLN
4.0000 mg | Freq: Once | INTRAMUSCULAR | Status: AC
  Administered 2011-12-13: 4 mg via INTRAVENOUS
  Filled 2011-12-13: qty 2

## 2011-12-13 MED ORDER — LORAZEPAM 2 MG/ML IJ SOLN
1.0000 mg | Freq: Once | INTRAMUSCULAR | Status: AC
  Administered 2011-12-13: 1 mg via INTRAVENOUS
  Filled 2011-12-13: qty 1

## 2011-12-13 NOTE — ED Notes (Signed)
Pt states she has not felt well for several days, having dizzyness, chest pain, today while she was working in the yard she got extremely dizzy with worse chest pain and vomiting.

## 2011-12-13 NOTE — ED Provider Notes (Signed)
History   This chart was scribed for Delora Fuel, MD by Malen Gauze. The patient was seen in room APA18/APA18 and the patient's care was started at 9:55PM.    CSN: [id number]  Arrival date & time 12/13/11  2039   First MD Initiated Contact with Patient 12/13/11 2154      Chief Complaint  Patient presents with  . Chest Pain    (Consider location/radiation/quality/duration/timing/severity/associated sxs/prior treatment) HPI NASIM GAROFANO is a 61 y.o. female who presents to the Emergency Department complaining of intermittent, radiating, moderate to severe chest pain with an onset today. Pain radiates across the chest and to the back. Pt has had similar episodes everyday for the last year but has gotten progressively worse to the point she can't take it anymore. Worst episode was today while she was working in the yard. Pt states the pain feels like "someone sitting on her chest" with associated lightheadedness, dizziness, and numbness in the upper extremities and lips. Pt rates the pain 3/10 at best and 8/10 at worse. Usual timing of these episodes is up to several hours; today's episode was an hr. Pt's BP was also elevated from nml (110/78) to 172/112. Baby ASA and NTG did not alleviate the pain; nothing aggravates the pain. SOB, diaphoresis, chills, and n/v/d, present. No HA, neck pain, abd pain, or extremity pain or edema. Pt had a stress test last year and angiogram 10 years ago. Pt has glaucoma (can't see out of left eye), HTN, cardiomegaly, and hypercholesteremia. Allergic to ASA, sulfonamides, and latex. No other pertinent medical problems.  PCP: Dr. Legrand Rams  Past Medical History  Diagnosis Date  . Chronic RUQ pain 2009    EUS slightly dilated CBD (7.62m), otherwise nl  . HTN (hypertension)   . Hypercholesteremia   . Glaucoma   . Mitral valve prolapse   . IBS (irritable bowel syndrome)   . Sickle cell trait   . G6PD deficiency   . Kidney stone   . Renal cyst     Past  Surgical History  Procedure Date  . Cholecystectomy 12/2007  . Complete hysterectomy   . Appendectomy   . Cataract extraction   . Angioplasty   . Colonoscopy 12/22/10    normal, cecal polyp cold bx  . Laparoscopy     adhesions  . Esophagogastroduodenoscopy 09/22/2011    Procedure: ESOPHAGOGASTRODUODENOSCOPY (EGD);  Surgeon: SDorothyann Peng MD;  Location: [facility];  Service: Endoscopy;  Laterality: N/A;  2:00    Family History  Problem Relation Age of Onset  . Colon cancer Mother     743s . Cancer Father   . Crohn's disease Sister   . Anesthesia problems Neg Hx     History  Substance Use Topics  . Smoking status: Former Smoker -- 0.5 packs/day for 30 years    Types: Cigarettes  . Smokeless tobacco: Former [employer]   Quit date: 10/15/1996  . Alcohol Use: Yes     former drinker (a 5th) now only a glass of wine a day but not everyday    OB History    Grav Para Term Preterm Abortions TAB SAB Ect Mult Living                  Review of Systems 10 Systems reviewed and are negative for acute change except as noted in the HPI.  Allergies  Aspirin; Sulfonamide derivatives; and Latex  Home Medications   Current Outpatient Rx  Name Route Sig Dispense Refill  .  ALPRAZOLAM 1 MG PO TABS Oral Take 1 mg by mouth at bedtime as needed.    Marland Kitchen BRIMONIDINE TARTRATE 0.15 % OP SOLN Ophthalmic Apply 1 drop to eye 3 (three) times daily.     . IRBESARTAN-HYDROCHLOROTHIAZIDE 150-12.5 MG PO TABS Oral Take 1 tablet by mouth daily.     Marland Kitchen LATANOPROST 0.005 % OP SOLN Ophthalmic Apply 1 drop to eye at bedtime.     Marland Kitchen ZOLPIDEM TARTRATE 10 MG PO TABS Oral Take 10 mg by mouth at bedtime as needed. For sleep    . DEXLANSOPRAZOLE 60 MG PO CPDR Oral Take 60 mg by mouth daily.        BP 141/84  Temp(Src) 98.7 F (37.1 C) (Oral)  Resp 22  Ht 5' 6.75" (1.695 m)  Wt 174 lb (78.926 kg)  BMI 27.46 kg/m2  SpO2 96%  Physical Exam  Nursing note and vitals reviewed. Constitutional: She is oriented to  person, place, and time. She appears well-developed and well-nourished.       Awake, alert, nontoxic appearance. Appears anxious.  HENT:  Head: Normocephalic and atraumatic.  Eyes: EOM are normal. Pupils are equal, round, and reactive to light. Right eye exhibits no discharge. Left eye exhibits no discharge.  Neck: Normal range of motion. Neck supple.  Cardiovascular: Normal rate, regular rhythm and normal heart sounds.   No murmur heard. Pulmonary/Chest: Effort normal and breath sounds normal. She exhibits no tenderness.  Abdominal: Soft. Bowel sounds are normal. There is no tenderness. There is no rebound.  Musculoskeletal: She exhibits no tenderness.       Baseline ROM, no obvious new focal weakness.  Neurological: She is alert and oriented to person, place, and time.       Mental status and motor strength appears baseline for patient and situation.  Skin: Skin is warm and dry. No rash noted.  Psychiatric: She has a normal mood and affect.    ED Course  Procedures (including critical care time)  DIAGNOSTIC STUDIES: Oxygen Saturation is 96% on room air, normal by my interpretation.    COORDINATION OF CARE:  Results for orders placed during the hospital encounter of 12/13/11  CBC      Component Value Range   WBC 7.7  4.0 - 10.5 (K/uL)   RBC 3.83 (*) 3.87 - 5.11 (MIL/uL)   Hemoglobin 13.0  12.0 - 15.0 (g/dL)   HCT 37.3  36.0 - 46.0 (%)   MCV 97.4  78.0 - 100.0 (fL)   MCH 33.9  26.0 - 34.0 (pg)   MCHC 34.9  30.0 - 36.0 (g/dL)   RDW 11.3 (*) 11.5 - 15.5 (%)   Platelets 163  150 - 400 (K/uL)  DIFFERENTIAL      Component Value Range   Neutrophils Relative 52  43 - 77 (%)   Neutro Abs 4.0  1.7 - 7.7 (K/uL)   Lymphocytes Relative 37  12 - 46 (%)   Lymphs Abs 2.9  0.7 - 4.0 (K/uL)   Monocytes Relative 8  3 - 12 (%)   Monocytes Absolute 0.6  0.1 - 1.0 (K/uL)   Eosinophils Relative 3  0 - 5 (%)   Eosinophils Absolute 0.2  0.0 - 0.7 (K/uL)   Basophils Relative 0  0 - 1 (%)    Basophils Absolute 0.0  0.0 - 0.1 (K/uL)  BASIC METABOLIC PANEL      Component Value Range   Sodium 139  135 - 145 (mEq/L)   Potassium 3.5  3.5 -  5.1 (mEq/L)   Chloride 99  96 - 112 (mEq/L)   CO2 25  19 - 32 (mEq/L)   Glucose, Bld 118 (*) 70 - 99 (mg/dL)   BUN 14  6 - 23 (mg/dL)   Creatinine, Ser 0.92  0.50 - 1.10 (mg/dL)   Calcium 10.3  8.4 - 10.5 (mg/dL)   GFR calc non Af Amer 67 (*) >90 (mL/min)   GFR calc Af Amer 77 (*) >90 (mL/min)  POCT I-STAT TROPONIN I      Component Value Range   Troponin i, poc 0.00  0.00 - 0.08 (ng/mL)   Comment 3           D-DIMER, QUANTITATIVE      Component Value Range   D-Dimer, Quant 0.36  0.00 - 0.48 (ug/mL-FEU)      Dg Chest Portable 1 View  12/13/2011  *RADIOLOGY REPORT*  Clinical Data: Chest pain  PORTABLE CHEST - 1 VIEW  Comparison: 06/21/2010, 09/16/2011 CT  Findings: Hypoaeration results in mild interstitial and vascular crowding.  There are mild opacities at the lung bases.  No pleural effusion.  No pneumothorax.  No acute osseous abnormality.  Heart size within normal limits.  Mediastinal contours within normal limits.  IMPRESSION: Mild lung base opacities; atelectasis versus infiltrate.  Original Report Authenticated By: Suanne Marker, M.D.    Date: 12/14/2011  Rate: 108  Rhythm: sinus tachycardia  QRS Axis: normal  Intervals: normal  ST/T Wave abnormalities: nonspecific ST/T changes  Conduction Disutrbances:none  Narrative Interpretation: nonspecific ST-T wave changes. When compared with ECG of 05/06/2000, ST-T. Changes are slightly improved.  Old EKG Reviewed: unchanged    She was given a dose of Ativan but states that she is not feeling any better. She continues to feel anxious and a sense of pressure in her chest. Cardiac markers and d-dimer are both normal. I will try her with another dose of Ativan and give her a dose of narcotic. She will need cardiac evaluation, but I feel this can be done through her cardiologist at  [facility] heart [facility] vascular.  She is feeling better at this point. She is sent home with a prescription for Ativan and I have asked her to discuss non-benzodiazepine treatment for anxiety with her PCP.  1. Anxiety   2. Chest discomfort       MDM  Episodes of dyspnea and numbness which sound most likely to be hyper ventilation. She does appear to be exceedingly anxious during her examination. Longevity of symptoms argues against serious pathology. I have reviewed her cardiac catheterization which was done in 2001, and she had clean coronary arteries at that time. She also had a negative stress test a year ago although I do not have access to those records. She will get a d-dimer to screen for pulmonary embolism and given a dose of Ativan and assess response.   I personally performed the services described in this documentation, which was scribed in my presence. The recorded information has been reviewed and considered.      Delora Fuel, MD [id number]

## 2011-12-13 NOTE — ED Notes (Signed)
ekg provided to dr. Roxanne Mins.

## 2011-12-14 MED ORDER — HYDROMORPHONE HCL PF 1 MG/ML IJ SOLN
1.0000 mg | Freq: Once | INTRAMUSCULAR | Status: AC
  Administered 2011-12-14: 1 mg via INTRAVENOUS
  Filled 2011-12-14: qty 1

## 2011-12-14 MED ORDER — LORAZEPAM 1 MG PO TABS: 2.0000 mg | ORAL_TABLET | Freq: Three times a day (TID) | ORAL | Status: AC | PRN

## 2011-12-14 MED ORDER — LORAZEPAM 2 MG/ML IJ SOLN
1.0000 mg | Freq: Once | INTRAMUSCULAR | Status: AC
  Administered 2011-12-14: 1 mg via INTRAVENOUS
  Filled 2011-12-14: qty 1

## 2011-12-14 NOTE — Discharge Instructions (Signed)
Make an appointment with your heart doctor to consider a repeat stress test since it has been about a year since her last one. Talk with your primary care doctor about possible medications to treat anxiety. Return to emergency Department symptoms are worsening.  Anxiety and Panic Attacks Your caregiver has informed you that you are having an anxiety or panic attack. There may be many forms of this. Most of the time these attacks come suddenly and without warning. They come at any time of day, including periods of sleep, and at any time of life. They may be strong and unexplained. Although panic attacks are very scary, they are physically harmless. Sometimes the cause of your anxiety is not known. Anxiety is a protective mechanism of the body in its fight or flight mechanism. Most of these perceived danger situations are actually nonphysical situations (such as anxiety over losing a job). CAUSES  The causes of an anxiety or panic attack are many. Panic attacks may occur in otherwise healthy people given a certain set of circumstances. There may be a genetic cause for panic attacks. Some medications may also have anxiety as a side effect. SYMPTOMS  Some of the most common feelings are:  Intense terror.   Dizziness, feeling faint.   Hot and cold flashes.   Fear of going crazy.   Feelings that nothing is real.   Sweating.   Shaking.   Chest pain or a fast heartbeat (palpitations).   Smothering, choking sensations.   Feelings of impending doom and that death is near.   Tingling of extremities, this may be from over-breathing.   Altered reality (derealization).   Being detached from yourself (depersonalization).  Several symptoms can be present to make up anxiety or panic attacks. DIAGNOSIS  The evaluation by your caregiver will depend on the type of symptoms you are experiencing. The diagnosis of anxiety or panic attack is made when no physical illness can be determined to be a cause  of the symptoms. TREATMENT  Treatment to prevent anxiety and panic attacks may include:  Avoidance of circumstances that cause anxiety.   Reassurance and relaxation.   Regular exercise.   Relaxation therapies, such as yoga.   Psychotherapy with a psychiatrist or therapist.   Avoidance of caffeine, alcohol and illegal drugs.   Prescribed medication.  SEEK IMMEDIATE MEDICAL CARE IF:   You experience panic attack symptoms that are different than your usual symptoms.   You have any worsening or concerning symptoms.  Document Released: 09/21/2005 Document Revised: 09/10/2011 Document Reviewed: 01/23/2010 [facility] Patient Information 2012 [place].  Lorazepam tablets What is this medicine? LORAZEPAM (lor A ze pam) is a benzodiazepine. It is used to treat anxiety. This medicine may be used for other purposes; ask your health care provider or pharmacist if you have questions. What should I tell my health care provider before I take this medicine? They need to know if you have any of these conditions: -alcohol or drug abuse problem -bipolar disorder, depression, psychosis or other mental health condition -glaucoma -kidney or liver disease -lung disease or breathing difficulties -myasthenia gravis -Parkinson's disease -seizures or a history of seizures -suicidal thoughts -an unusual or allergic reaction to lorazepam, other benzodiazepines, foods, dyes, or preservatives -pregnant or trying to get pregnant -breast-feeding How should I use this medicine? Take this medicine by mouth with a glass of water. Follow the directions on the prescription label. If it upsets your stomach, take it with food or milk. Take your medicine at regular  intervals. Do not take it more often than directed. Do not stop taking except on the advice of your doctor or health care professional. Talk to your pediatrician regarding the use of this medicine in children. Special care may be  needed. Overdosage: If you think you have taken too much of this medicine contact a poison control center or emergency room at once. NOTE: This medicine is only for you. Do not share this medicine with others. What if I miss a dose? If you miss a dose, take it as soon as you can. If it is almost time for your next dose, take only that dose. Do not take double or extra doses. What may interact with this medicine? -barbiturate medicines for inducing sleep or treating seizures, like phenobarbital -clozapine -medicines for depression, mental problems or psychiatric disturbances -medicines for sleep -phenytoin -probenecid -theophylline -valproic acid This list may not describe all possible interactions. Give your health care provider a list of all the medicines, herbs, non-prescription drugs, or dietary supplements you use. Also tell them if you smoke, drink alcohol, or use illegal drugs. Some items may interact with your medicine. What should I watch for while using this medicine? Visit your doctor or health care professional for regular checks on your progress. Your body may become dependent on this medicine, ask your doctor or health care professional if you still need to take it. However, if you have been taking this medicine regularly for some time, do not suddenly stop taking it. You must gradually reduce the dose or you may get severe side effects. Ask your doctor or health care professional for advice before increasing or decreasing the dose. Even after you stop taking this medicine it can still affect your body for several days. You may get drowsy or dizzy. Do not drive, use machinery, or do anything that needs mental alertness until you know how this medicine affects you. To reduce the risk of dizzy and fainting spells, do not stand or sit up quickly, especially if you are an older patient. Alcohol may increase dizziness and drowsiness. Avoid alcoholic drinks. Do not treat yourself for coughs,  colds or allergies without asking your doctor or health care professional for advice. Some ingredients can increase possible side effects. What side effects may I notice from receiving this medicine? Side effects that you should report to your doctor or health care professional as soon as possible: -changes in vision -confusion -depression -mood changes, excitability or aggressive behavior -movement difficulty, staggering or jerky movements -muscle cramps -restlessness -weakness or tiredness Side effects that usually do not require medical attention (report to your doctor or health care professional if they continue or are bothersome): -constipation or diarrhea -difficulty sleeping, nightmares -dizziness, drowsiness -headache -nausea, vomiting This list may not describe all possible side effects. Call your doctor for medical advice about side effects. You may report side effects to FDA at 1-800-FDA-1088. Where should I keep my medicine? Keep out of the reach of children. This medicine can be abused. Keep your medicine in a safe place to protect it from theft. Do not share this medicine with anyone. Selling or giving away this medicine is dangerous and against the law. Store at room temperature between 20 and 25 degrees C (68 and 77 degrees F). Protect from light. Keep container tightly closed. Throw away any unused medicine after the expiration date. NOTE: This sheet is a summary. It may not cover all possible information. If you have questions about this medicine, talk to your  doctor, pharmacist, or health care provider.  2012, [employer]. (03/23/2008 2:58:20 PM)

## 2012-01-21 ENCOUNTER — Ambulatory Visit: Payer: BC Managed Care – PPO | Admitting: Urgent Care

## 2012-02-03 ENCOUNTER — Ambulatory Visit: Payer: BC Managed Care – PPO | Admitting: Urgent Care

## 2012-02-05 ENCOUNTER — Ambulatory Visit: Payer: BC Managed Care – PPO | Admitting: Urgent Care

## 2012-05-03 ENCOUNTER — Ambulatory Visit: Payer: BC Managed Care – PPO | Admitting: Family Medicine

## 2012-07-12 ENCOUNTER — Encounter: Payer: Self-pay | Admitting: Gastroenterology

## 2012-07-12 LAB — BASIC METABOLIC PANEL
AST: 42 U/L
Alkaline Phosphatase: 32 U/L
BUN/Creatinine Ratio: 16
BUN: 28 mg/dL — AB (ref 4–21)
Bilirubin, Direct: 0.2 mg/dL (ref 0.01–0.4)
Sodium: 141 mmol/L (ref 137–147)
Total Bilirubin: 1 mg/dL

## 2012-07-13 ENCOUNTER — Ambulatory Visit (INDEPENDENT_AMBULATORY_CARE_PROVIDER_SITE_OTHER): Payer: BC Managed Care – PPO | Admitting: Urgent Care

## 2012-07-13 ENCOUNTER — Encounter: Payer: Self-pay | Admitting: Urgent Care

## 2012-07-13 ENCOUNTER — Ambulatory Visit: Payer: BC Managed Care – PPO | Admitting: Urgent Care

## 2012-07-13 VITALS — BP 102/61 | HR 94 | Temp 98.4°F | Ht 63.0 in | Wt 187.0 lb

## 2012-07-13 DIAGNOSIS — R1011 Right upper quadrant pain: Secondary | ICD-10-CM

## 2012-07-13 DIAGNOSIS — D126 Benign neoplasm of colon, unspecified: Secondary | ICD-10-CM

## 2012-07-13 DIAGNOSIS — K589 Irritable bowel syndrome without diarrhea: Secondary | ICD-10-CM

## 2012-07-13 DIAGNOSIS — R197 Diarrhea, unspecified: Secondary | ICD-10-CM

## 2012-07-13 NOTE — Progress Notes (Signed)
Primary Care Physician:  Rosita Fire, MD Primary Gastroenterologist:  Dr. Oneida Alar  Chief Complaint  Patient presents with  . Abdominal Pain   HPI:  [name] is a 61 y.o. female here for chronic abdominal pain.  Hx IBS & GERD with intermittent RUQ pain.  Tells me she was yawning the other day & has severe cramp in jaw, followed by RUQ cramps & leg & hand cramps.  She has been seen by Dr Merlene Laughter.  MRI brain was abnormal.  Pt has multiple questions about neurological symptoms she is having including shake, light-headness, dizziness, muscle cramps & paresthesias.  Anxiety & shakiness improve after eating a meal.  Feels fuzzy all the time & lightheaded like she is in a fog.  12/12 EGD showed mild gastritis.  Small bowel bx negative for celiac disease.  Stools, TSH normal.  CT A/P w/ contrast-1.5 cm left hepatic lobe cyst. No suspicious hepatic lesions. Status post cholecystectomy. Common bile duct measures 10 mm but tapers normally at the level of the ampulla. No interval change from prior CT abdomen/pelvis.  Sent to Dr Merlene Laughter for chronic pain management.  Offered HBT for SBBO, however pt was feeling better so this was cancelled.  She is now gaining weight again.   Under a lot of stress with husband having prostate ca.  Admits to chronic free-floating anxiety, not on daily treatment.    Recent Results (from the past 8400 hour(s))  AMYLASE   Collection Time   09/15/11 12:25 PM      Component Value Range   Amylase 135 (*) 0 - 105 U/L  CBC WITH DIFFERENTIAL   Collection Time   09/15/11 12:25 PM      Component Value Range   WBC 5.4  4.0 - 10.5 K/uL   RBC 3.81 (*) 3.87 - 5.11 MIL/uL   Hemoglobin 12.9  12.0 - 15.0 g/dL   HCT 37.3  36.0 - 46.0 %   MCV 97.9  78.0 - 100.0 fL   MCH 33.9  26.0 - 34.0 pg   MCHC 34.6  30.0 - 36.0 g/dL   RDW 12.0  11.5 - 15.5 %   Platelets 180  150 - 400 K/uL   Neutrophils Relative 53  43 - 77 %   Neutro Abs 2.9  1.7 - 7.7 K/uL   Lymphocytes Relative 33  12 -  46 %   Lymphs Abs 1.8  0.7 - 4.0 K/uL   Monocytes Relative 9  3 - 12 %   Monocytes Absolute 0.5  0.1 - 1.0 K/uL   Eosinophils Relative 4  0 - 5 %   Eosinophils Absolute 0.2  0.0 - 0.7 K/uL   Basophils Relative 1  0 - 1 %   Basophils Absolute 0.1  0.0 - 0.1 K/uL   Smear Review Criteria for review not met    COMPREHENSIVE METABOLIC PANEL   Collection Time   09/15/11 12:25 PM      Component Value Range   Sodium 136  135 - 145 mEq/L   Potassium 5.2  3.5 - 5.3 mEq/L   Chloride 102  96 - 112 mEq/L   CO2 27  19 - 32 mEq/L   Glucose, Bld 103 (*) 70 - 99 mg/dL   BUN 21  6 - 23 mg/dL   Creat 0.92  0.50 - 1.10 mg/dL   Total Bilirubin 1.0  0.3 - 1.2 mg/dL   Alkaline Phosphatase 50  39 - 117 U/L   AST 30  0 -  37 U/L   ALT 32  0 - 35 U/L   Total Protein 7.3  6.0 - 8.3 g/dL   Albumin 4.8  3.5 - 5.2 g/dL   Calcium 10.8 (*) 8.4 - 10.5 mg/dL  LIPASE   Collection Time   09/15/11 12:25 PM      Component Value Range   Lipase 36  0 - 75 U/L  URINALYSIS WITH CULTURE REFLEX   Collection Time   09/15/11 12:25 PM      Component Value Range   Color, Urine YELLOW  YELLOW   APPearance CLEAR  CLEAR   Specific Gravity, Urine 1.014  1.005 - 1.030   pH 6.0  5.0 - 8.0   Glucose, UA NEG  NEG mg/dL   Bilirubin Urine NEG  NEG   Ketones, ur NEG  NEG mg/dL   Hgb urine dipstick NEG  NEG   Protein, ur NEG  NEG mg/dL   Urobilinogen, UA 0.2  0.0 - 1.0 mg/dL   Nitrite NEG  NEG   Leukocytes, UA NEG  NEG   Squamous Epithelial / LPF NONE SEEN  RARE   Crystals NONE SEEN  NEG   Casts NONE SEEN  NEG   WBC, UA 0-2  <3 WBC/hpf   RBC / HPF 0-2  <3 RBC/hpf   Bacteria, UA NONE SEEN  RARE  STOOL CULTURE   Collection Time   10/01/11 11:11 AM      Component Value Range   Organism ID, Bacteria No Salmonella,Shigella,Campylobacter or Yersinia     Organism ID, Bacteria isolated.    GIARDIA/CRYPTOSPORIDIUM (EIA)   Collection Time   10/01/11 11:11 AM      Component Value Range   Giardia Screen (EIA) NEGATIVE      Cryptosporidium Screen (EIA) NEGATIVE    CLOSTRIDIUM DIFFICILE BY PCR   Collection Time   10/01/11 11:11 AM      Component Value Range   C difficile by pcr Not Detected  Not Detected  TSH   Collection Time   10/01/11  3:05 PM      Component Value Range   TSH 1.652  0.350 - 4.500 uIU/mL  CBC   Collection Time   12/13/11  8:50 PM      Component Value Range   WBC 7.7  4.0 - 10.5 K/uL   RBC 3.83 (*) 3.87 - 5.11 MIL/uL   Hemoglobin 13.0  12.0 - 15.0 g/dL   HCT 37.3  36.0 - 46.0 %   MCV 97.4  78.0 - 100.0 fL   MCH 33.9  26.0 - 34.0 pg   MCHC 34.9  30.0 - 36.0 g/dL   RDW 11.3 (*) 11.5 - 15.5 %   Platelets 163  150 - 400 K/uL  DIFFERENTIAL   Collection Time   12/13/11  8:50 PM      Component Value Range   Neutrophils Relative 52  43 - 77 %   Neutro Abs 4.0  1.7 - 7.7 K/uL   Lymphocytes Relative 37  12 - 46 %   Lymphs Abs 2.9  0.7 - 4.0 K/uL   Monocytes Relative 8  3 - 12 %   Monocytes Absolute 0.6  0.1 - 1.0 K/uL   Eosinophils Relative 3  0 - 5 %   Eosinophils Absolute 0.2  0.0 - 0.7 K/uL   Basophils Relative 0  0 - 1 %   Basophils Absolute 0.0  0.0 - 0.1 K/uL  BASIC METABOLIC PANEL   Collection Time  12/13/11  8:50 PM      Component Value Range   Sodium 139  135 - 145 mEq/L   Potassium 3.5  3.5 - 5.1 mEq/L   Chloride 99  96 - 112 mEq/L   CO2 25  19 - 32 mEq/L   Glucose, Bld 118 (*) 70 - 99 mg/dL   BUN 14  6 - 23 mg/dL   Creatinine, Ser 0.92  0.50 - 1.10 mg/dL   Calcium 10.3  8.4 - 10.5 mg/dL   GFR calc non Af Amer 67 (*) >90 mL/min   GFR calc Af Amer 77 (*) >90 mL/min  POCT I-STAT TROPONIN I   Collection Time   12/13/11  8:52 PM      Component Value Range   Troponin i, poc 0.00  0.00 - 0.08 ng/mL   Comment 3           D-DIMER, QUANTITATIVE   Collection Time   12/13/11 10:11 PM      Component Value Range   D-Dimer, Quant 0.36  0.00 - 0.48 ug/mL-FEU   Past Medical History  Diagnosis Date  . Chronic RUQ pain 2009    EUS slightly dilated CBD (7.33m), otherwise nl    . HTN (hypertension)   . Hypercholesteremia   . Glaucoma(365)   . Mitral valve prolapse   . IBS (irritable bowel syndrome)   . Sickle cell trait   . G6PD deficiency   . Kidney stone   . Renal cyst     Past Surgical History  Procedure Date  . Cholecystectomy 12/2007  . Complete hysterectomy   . Appendectomy   . Cataract extraction   . Angioplasty   . Colonoscopy 12/22/10    normal, cecal polyp cold bx  . Laparoscopy     adhesions  . Esophagogastroduodenoscopy 09/22/2011    Mild gastritis/Duodenitis, MILD    Current Outpatient Prescriptions  Medication Sig Dispense Refill  . brimonidine (ALPHAGAN) 0.15 % ophthalmic solution 1 drop 3 (three) times daily.        . irbesartan-hydrochlorothiazide (AVALIDE) 150-12.5 MG per tablet Take 1 tablet by mouth daily.       . lansoprazole (PREVACID) 30 MG capsule Take 30 mg by mouth daily.        .Marland Kitchenlatanoprost (XALATAN) 0.005 % ophthalmic solution 1 drop at bedtime.        .Marland Kitchenzolpidem (AMBIEN) 10 MG tablet Take 10 mg by mouth at bedtime as needed.         Allergies as of 07/13/2012 - Review Complete 07/13/2012  Allergen Reaction Noted  . Aspirin Other (See Comments) 09/18/2011  . Sulfonamide derivatives  12/10/2010  . Latex Rash 12/13/2011   Review of Systems: Gen: see HPI CV: Denies chest pain, angina, palpitations, syncope, orthopnea, PND, peripheral edema, and claudication. Resp: Denies dyspnea at rest, dyspnea with exercise, cough, sputum, wheezing, coughing up blood, and pleurisy. GI: Denies vomiting blood, jaundice, and fecal incontinence.   Denies dysphagia or odynophagia. Derm: Denies rash, itching, dry skin, hives, moles, warts, or unhealing ulcers.  Psych: Anxiety.  Denies depression, memory loss, suicidal ideation, hallucinations, paranoia, and confusion. Heme: Denies bruising, bleeding, and enlarged lymph nodes. MSK:  Cramps in jaw, right side, right extremities.  Physical Exam: BP 102/61  Pulse 94  Temp 98.4 F (36.9  C) (Temporal)  Ht 5' 3"  (1.6 m)  Wt 187 lb (84.823 kg)  BMI 33.13 kg/m2 General:   Alert,  Well-developed, well-nourished, pleasant and cooperative in NAD.  Accompanied by  her husband.   Eyes:  Sclera clear, no icterus.   Conjunctiva pink. Mouth:  No deformity or lesions, oropharynx pink and moist. Neck:  Supple; no masses or thyromegaly. Heart:  Regular rate and rhythm; no murmurs, clicks, rubs,  or gallops. Abdomen:  Normal bowel sounds.  No bruits.  Soft, non-distended, nontender without masses, hepatosplenomegaly or hernias noted.  No guarding or rebound tenderness.   Rectal:  Deferred. Msk:  Symmetrical without gross deformities.  Pulses:  Normal pulses noted. Extremities:  No clubbing or edema. Neurologic:  Alert and oriented x4;  grossly normal neurologically. Skin:  Intact without significant lesions or rashes.

## 2012-07-13 NOTE — Assessment & Plan Note (Signed)
Suspect IBS.  HBT for SBBO.  Normal small bowel biopsy.  TTG IgA

## 2012-07-13 NOTE — Patient Instructions (Addendum)
Hydrogen breath test Please perform 24 hr urine collection with next episode of pain & return to lab Next colonoscopy March 2017 Be sure to check with Dr Legrand Rams about getting glucose meter to check for low blood sugars, low-dose anxiety medication & referral to neurologist

## 2012-07-13 NOTE — Assessment & Plan Note (Addendum)
Intermittent severe RUQ pain, cramps associated with neurologic symptoms like paresthesias, dizziness, & muscle cramps as well as some diarrhea.  Colonoscopy, EGD, CT scan & small bowel biopsy do not reveal source of pain.  ?functional component vs. chest wall pain.  Doubt celiac disease or acute intermittent porphyria.  Underlying free-floating general anxiety may be contributing to symptoms, however further neurologic work-up to r/o MS or other etiologies is warranted.  Query hypoglycemia as well.  She will discuss her desire for a second neurology opinion with Dr Legrand Rams.  24 hr urine collection for PBGs, ALA & porphyrins Follow up with Dr Legrand Rams about low-dose anxiety medication, possible hypoglycemia & referral to neurologist   If no improvement, will need tertiary referral to [facility] GI clinic

## 2012-07-14 ENCOUNTER — Telehealth: Payer: Self-pay | Admitting: Urgent Care

## 2012-07-14 NOTE — Telephone Encounter (Signed)
Ms Harney returned Kandice's call. She would like for you to call her back. Thanks.

## 2012-07-14 NOTE — Progress Notes (Signed)
Faxed to PCP

## 2012-07-14 NOTE — Progress Notes (Signed)
Spoke with pt, she is aware of recommendations. Lab orders faxed to lab. Pt will go by and have blood work drawn and pick up container for 24 hour urine.

## 2012-07-14 NOTE — Telephone Encounter (Signed)
LMOM for return call to go over labs. See my OV note.

## 2012-07-14 NOTE — Progress Notes (Signed)
Attempted to call pt.  [name]-here's info if pt calls back & I am not here.  Reviewed labs from 07/12/12.  Her kidney function is a bit high (creatinine is 1.73).  Calcium is also high.  These are things that she should follow up with Dr Legrand Rams.    On our end, liver tests are barely elevated just above normal & amylase is barely elevated as well.  These findings are very non-specific.    Keep plan as we discussed yesterday.  Be sure she is drinking plenty fluids.   If pt has any questions, I will gladly call tomorrow.  Thanks

## 2012-07-15 ENCOUNTER — Telehealth: Payer: Self-pay | Admitting: Urgent Care

## 2012-07-15 NOTE — Progress Notes (Signed)
REVIEWED.  

## 2012-07-15 NOTE — Telephone Encounter (Signed)
Spoke w/ pt.  Multiple questions answered.  Pt will FU w/ PCP regarding renal fcn.

## 2012-07-15 NOTE — Telephone Encounter (Signed)
done

## 2012-07-18 LAB — TISSUE TRANSGLUTAMINASE, IGA: Tissue Transglutaminase Ab, IgA: 4.3 U/mL (ref <20)

## 2012-07-19 ENCOUNTER — Other Ambulatory Visit: Payer: Self-pay | Admitting: Gastroenterology

## 2012-07-19 ENCOUNTER — Telehealth: Payer: Self-pay | Admitting: Urgent Care

## 2012-07-19 DIAGNOSIS — R197 Diarrhea, unspecified: Secondary | ICD-10-CM

## 2012-07-19 NOTE — Telephone Encounter (Signed)
Does pt have upcoming appt for SBBO HBT? If not, she needs this arranged. Thanks

## 2012-07-19 NOTE — Telephone Encounter (Signed)
Patient is scheduled for HBT on Tuesday October 22nd at 7:00

## 2012-07-25 ENCOUNTER — Encounter (HOSPITAL_COMMUNITY): Payer: Self-pay

## 2012-07-25 NOTE — Progress Notes (Signed)
Quick Note:  Did pt turn in urine? Please let pt know-No evidence of celiac on labs. TL:ZBCAF,QEHAZCU, MD  ______

## 2012-07-25 NOTE — Progress Notes (Signed)
LM for pt to call

## 2012-07-25 NOTE — Progress Notes (Signed)
Quick Note:  LM for pt to call. ______

## 2012-07-26 NOTE — Progress Notes (Signed)
Quick Note:    noted  ______

## 2012-07-26 NOTE — Progress Notes (Signed)
Quick Note:  Called and informed pt of lab results. She said she has not done the urine yet, because she had not had the problems again. Said she was supposed to do it when she is having problems. ______

## 2012-07-28 ENCOUNTER — Encounter (HOSPITAL_COMMUNITY)
Admission: RE | Disposition: A | Discharge status: Home / Self Care | Payer: Self-pay | Source: Ambulatory Visit | Attending: Gastroenterology

## 2012-07-28 ENCOUNTER — Ambulatory Visit (HOSPITAL_COMMUNITY)
Admission: RE | Admit: 2012-07-28 | Discharge: 2012-07-28 | Disposition: A | Discharge status: Home / Self Care | Payer: BC Managed Care – PPO | Source: Ambulatory Visit | Attending: Gastroenterology | Admitting: Gastroenterology

## 2012-07-28 ENCOUNTER — Encounter (HOSPITAL_COMMUNITY): Payer: Self-pay | Admitting: *Deleted

## 2012-07-28 ENCOUNTER — Telehealth: Payer: Self-pay | Admitting: Urgent Care

## 2012-07-28 DIAGNOSIS — R143 Flatulence: Secondary | ICD-10-CM

## 2012-07-28 DIAGNOSIS — R142 Eructation: Secondary | ICD-10-CM

## 2012-07-28 DIAGNOSIS — R197 Diarrhea, unspecified: Secondary | ICD-10-CM | POA: Insufficient documentation

## 2012-07-28 DIAGNOSIS — R141 Gas pain: Secondary | ICD-10-CM

## 2012-07-28 SURGERY — BREATH TEST, FOR INTESTINAL BACTERIAL OVERGROWTH

## 2012-07-28 MED ORDER — LACTULOSE 10 GM/15ML PO SOLN
ORAL | Status: AC
  Filled 2012-07-28: qty 60

## 2012-07-28 MED ORDER — LACTULOSE 10 GM/15ML PO SOLN
25.0000 g | Freq: Once | ORAL | Status: AC
  Administered 2012-07-28: 25 g via ORAL
  Filled 2012-07-28: qty 45

## 2012-07-28 NOTE — Op Note (Signed)
PHYSICIAN:Dr. Barney Drain  DATE OF PROCEDURE:07/28/12  PROCEDURE: Hydrogen Breath Test  INDICATION FOR EXAMINATION: [name] is a 61 y.o. female with chronic diarrhea & bloating.  PRE-PROCEDURE CHECK: Patient denied beans, grain, and fiber in the past 24 hours. She has been NPO for 12 hours. She denies smoking, sleep, or exercise within the past 30 minutes. She denied antibiotics within past two weeks. Denies diarrhea. Denies recent bowel prep or laxatives.    TEST SUGAR:  Lactulose 25 grams   FINDINGS:  Normal hydrogen breath test   ASSESSMENT:  No evidence of small bowel bacterial overgrowth  CC: FANTA,TESFAYE, MD

## 2012-07-28 NOTE — Telephone Encounter (Addendum)
Please call pt & let her know that her breath test was normal. She does not have small bowel bacterial overgrowth. Keep plans as previously discussed.

## 2012-07-28 NOTE — Telephone Encounter (Signed)
Called and informed pt.  

## 2012-07-28 NOTE — Progress Notes (Signed)
No beans, bran or high fiber cereal the day before the procedure? NO NPO except for water 12 hours before procedure?  YES No smoking, sleeping or vigorous exercising for at least 30 before procedure? NO Recent antibiotic use and/or diarrhea? NO   If yes, physician notified.    Time Baseline 15 mins 30 mins 45 mins 60 mins 75 mins 90 mins 105 mins 178mns [id number] [id number] 0   0 1 1 2 2 4 5 7 7 7  5 6

## 2012-07-28 NOTE — Telephone Encounter (Signed)
LMOM to call.

## 2012-07-28 NOTE — Telephone Encounter (Signed)
Ms [name] called. Please call her back. Thanks.

## 2012-08-23 ENCOUNTER — Encounter: Payer: Self-pay | Admitting: Family Medicine

## 2012-08-23 ENCOUNTER — Ambulatory Visit (INDEPENDENT_AMBULATORY_CARE_PROVIDER_SITE_OTHER): Payer: BC Managed Care – PPO | Admitting: Family Medicine

## 2012-08-23 VITALS — BP 126/82 | HR 88 | Resp 18 | Ht 66.75 in | Wt 197.0 lb

## 2012-08-23 DIAGNOSIS — K589 Irritable bowel syndrome without diarrhea: Secondary | ICD-10-CM

## 2012-08-23 DIAGNOSIS — G4700 Insomnia, unspecified: Secondary | ICD-10-CM

## 2012-08-23 DIAGNOSIS — M791 Myalgia, unspecified site: Secondary | ICD-10-CM

## 2012-08-23 DIAGNOSIS — J45909 Unspecified asthma, uncomplicated: Secondary | ICD-10-CM

## 2012-08-23 DIAGNOSIS — N393 Stress incontinence (female) (male): Secondary | ICD-10-CM

## 2012-08-23 DIAGNOSIS — F419 Anxiety disorder, unspecified: Secondary | ICD-10-CM

## 2012-08-23 DIAGNOSIS — F5104 Psychophysiologic insomnia: Secondary | ICD-10-CM

## 2012-08-23 DIAGNOSIS — IMO0001 Reserved for inherently not codable concepts without codable children: Secondary | ICD-10-CM

## 2012-08-23 DIAGNOSIS — F411 Generalized anxiety disorder: Secondary | ICD-10-CM

## 2012-08-23 MED ORDER — ALBUTEROL SULFATE HFA 108 (90 BASE) MCG/ACT IN AERS: 2.0000 | INHALATION_SPRAY | RESPIRATORY_TRACT | Status: DC | PRN

## 2012-08-23 MED ORDER — ALPRAZOLAM 1 MG PO TABS: 1.0000 mg | ORAL_TABLET | Freq: Two times a day (BID) | ORAL | Status: DC | PRN

## 2012-08-23 NOTE — Patient Instructions (Addendum)
Try the kegal exercises Continue current medications Xanax refilled  I will obtain records  We will call about the cough medicine  Albuterol as needed for asthma  F/U 3 months

## 2012-08-25 ENCOUNTER — Telehealth: Payer: Self-pay | Admitting: Family Medicine

## 2012-08-25 DIAGNOSIS — F419 Anxiety disorder, unspecified: Secondary | ICD-10-CM | POA: Insufficient documentation

## 2012-08-25 DIAGNOSIS — J45909 Unspecified asthma, uncomplicated: Secondary | ICD-10-CM | POA: Insufficient documentation

## 2012-08-25 DIAGNOSIS — N393 Stress incontinence (female) (male): Secondary | ICD-10-CM | POA: Insufficient documentation

## 2012-08-25 DIAGNOSIS — F5104 Psychophysiologic insomnia: Secondary | ICD-10-CM | POA: Insufficient documentation

## 2012-08-25 DIAGNOSIS — M791 Myalgia, unspecified site: Secondary | ICD-10-CM | POA: Insufficient documentation

## 2012-08-25 MED ORDER — HYDROCOD POLST-CHLORPHEN POLST 10-8 MG/5ML PO LQCR: 5.0000 mL | Freq: Two times a day (BID) | ORAL | Status: DC | PRN

## 2012-08-25 NOTE — Assessment & Plan Note (Signed)
Continue xanax

## 2012-08-25 NOTE — Telephone Encounter (Signed)
Called and left message for patient to return call.   Cough med refilled.

## 2012-08-25 NOTE — Assessment & Plan Note (Signed)
F/U neurology, EMG studies, on baclofen

## 2012-08-25 NOTE — Assessment & Plan Note (Signed)
Continue [name], husband going through palliative treatments for metastatic prostate cancer

## 2012-08-25 NOTE — Telephone Encounter (Signed)
Patient in and rx for shingles sent to requested pharmacy.

## 2012-08-25 NOTE — Assessment & Plan Note (Signed)
Followed by GI , on probiotics

## 2012-08-25 NOTE — Assessment & Plan Note (Signed)
Currently stable, asking for refill on her cough medicine , using albuterol as needed

## 2012-08-25 NOTE — Assessment & Plan Note (Signed)
New symptoms, but very rare occurrence, will try kegal exercises No constipation

## 2012-08-25 NOTE — Progress Notes (Signed)
  Subjective:    Patient ID: [name], female    DOB: 06-27-1951, 61 y.o.   MRN: [id number]  HPI Pt here to establish care. Previous PCP - Dr. Legrand Rams Neurology- Dr. Jannifer Franklin   Cards- Dr. Debara Pickett [facility]- podiatrist   GI- Dr. [facility]  [facility]- [facility] Medications and history reviewed Currently recieiving work up for myalgias and spasms onf legs, arms and face, EMG pending, she is to start baclofen by neurology, had neg brain MRI, no spinal tap, ? Concern for MS  Cardiomegaly- followed by cars, has angina on and off, uses NTG, neg stress testing Stress- husband with metastatic prostate cancer, being treated at the [place], currently on xanax as needed, she takes before his appointments typically Asthma since childhood on albuterol Migraines- previously on maxalt , had a bad reaction to imitrex Chronic insomnia- uses xanax and ambien Chronic diarrhea/IBS followed by GI Past few weeks feels like she has some incontinence betweenurinating  Review of Systems  GEN- denies fatigue, fever, weight loss,weakness, recent illness HEENT- denies eye drainage, change in vision, nasal discharge, CVS- denies chest pain, palpitations RESP- denies SOB, cough, wheeze ABD- denies N/V, change in stools, abd pain GU- denies dysuria, hematuria, dribbling, incontinence, + diarrhea MSK- denies joint pain, muscle aches, injury Neuro- denies headache, dizziness, syncope, seizure activity      Objective:   Physical Exam GEN- NAD, alert and oriented x3 HEENT- PERRL, EOMI, non injected sclera, pink conjunctiva, MMM, oropharynx clear Neck- Supple, CVS- RRR, no murmur RESP-CTAB ABS-NABS,soft NT,ND EXT- No edema Psych- normal affect and mood Pulses- Radial, DP- 2+        Assessment & Plan:

## 2012-08-29 ENCOUNTER — Other Ambulatory Visit: Payer: Self-pay | Admitting: Family Medicine

## 2012-08-29 DIAGNOSIS — Z139 Encounter for screening, unspecified: Secondary | ICD-10-CM

## 2012-09-12 ENCOUNTER — Telehealth: Payer: Self-pay

## 2012-09-12 NOTE — Telephone Encounter (Signed)
Pt called and said she saw Dr. Glo Herring today and had a bladder infection and he prescribed an antibiotic. She said she never heard from the urine sample that was done several weeks ago ordered by Vickey Huger, NP and she would like to know about it. She is aware it might be tomorrow before I can let her know.   FYI   Her doctor at [facility] took her off of Lipitor after he reviewed her labs. He did not replace it with another med.

## 2012-09-12 NOTE — Telephone Encounter (Signed)
Last note from 10/22, pt stated that she was not going to do urine spec unless she had pain.  I have not heard anything from lab.  When did she turn in specimen?

## 2012-09-13 NOTE — Telephone Encounter (Signed)
Some results have been printed, two pending. ( Collected 08/25/2012). Placing on Kandice's desk for review.

## 2012-09-13 NOTE — Telephone Encounter (Signed)
Called the lab and spoke to [facility]. She said she will fax over the remainder results this afternoon. Also, spoke with pt and she is in favor of referral to [facility]. She is aware that Brennan Bailey will review the labs and we will call her tomorrow.

## 2012-09-13 NOTE — Telephone Encounter (Signed)
Please call lab to see what is going on with labs for acute intermittent porphyria Offer consult at [facility] urology or urologist of choice for gross hematuria Thanks

## 2012-09-13 NOTE — Telephone Encounter (Signed)
Called pt. She said they told her it would take a couple of week's to result. She also would like to know if Maia Petties , NP would recommend her see a urologist. She said she had blood in her urine yesterday. She is taking an antibiotic now. She said she had a history of a sickle cell trait and her urine has been burgundy at times, but it improved with the birth of her son ( she was age 61 then). She does have some bladder pain. Please advise!

## 2012-09-13 NOTE — Telephone Encounter (Signed)
Patient is scheduled with [facility] with Dr. Janice Norrie on Thurs 10/13/12 at 9:45 and she is aware

## 2012-09-15 NOTE — Telephone Encounter (Signed)
Called pt to let her know that Joanne Huger, NP is out of office until Mon. LMOM and told her to call if she has questions.

## 2012-09-19 NOTE — Telephone Encounter (Signed)
Pt has appt tomorrow, she had repeat labs in Nov with [facility], normal BUN/Creatinine,

## 2012-09-19 NOTE — Telephone Encounter (Signed)
Pt called for results. I told her Brennan Bailey has been out of office and hopefully today we will be calling her back.

## 2012-09-19 NOTE — Telephone Encounter (Signed)
Called and informed pt. She said she has an appt with Dr. Buelah Manis tomorrow . Will route to Dr. Buelah Manis and to Manuela Schwartz to schedule OV appt with Dr. Oneida Alar.

## 2012-09-19 NOTE — Telephone Encounter (Signed)
Please let pt know that her urine test results were normal except her creatinine (Kidney fcn) was a bit high. She should follow up with her PCP for this.  Follow up Dr Oneida Alar ONLY in 6 weeks

## 2012-09-20 ENCOUNTER — Ambulatory Visit (HOSPITAL_COMMUNITY)
Admission: RE | Admit: 2012-09-20 | Discharge: 2012-09-20 | Disposition: A | Discharge status: Home / Self Care | Payer: BC Managed Care – PPO | Source: Ambulatory Visit | Attending: Family Medicine | Admitting: Family Medicine

## 2012-09-20 ENCOUNTER — Other Ambulatory Visit: Payer: Self-pay | Admitting: Internal Medicine

## 2012-09-20 ENCOUNTER — Encounter: Payer: Self-pay | Admitting: Family Medicine

## 2012-09-20 ENCOUNTER — Ambulatory Visit (INDEPENDENT_AMBULATORY_CARE_PROVIDER_SITE_OTHER): Payer: BC Managed Care – PPO | Admitting: Family Medicine

## 2012-09-20 VITALS — BP 122/68 | HR 100 | Resp 18 | Ht 66.75 in | Wt 195.1 lb

## 2012-09-20 DIAGNOSIS — J45909 Unspecified asthma, uncomplicated: Secondary | ICD-10-CM | POA: Insufficient documentation

## 2012-09-20 DIAGNOSIS — I10 Essential (primary) hypertension: Secondary | ICD-10-CM | POA: Insufficient documentation

## 2012-09-20 DIAGNOSIS — IMO0001 Reserved for inherently not codable concepts without codable children: Secondary | ICD-10-CM

## 2012-09-20 DIAGNOSIS — R0602 Shortness of breath: Secondary | ICD-10-CM | POA: Insufficient documentation

## 2012-09-20 DIAGNOSIS — R05 Cough: Secondary | ICD-10-CM

## 2012-09-20 DIAGNOSIS — N289 Disorder of kidney and ureter, unspecified: Secondary | ICD-10-CM

## 2012-09-20 DIAGNOSIS — Z87891 Personal history of nicotine dependence: Secondary | ICD-10-CM | POA: Insufficient documentation

## 2012-09-20 DIAGNOSIS — M791 Myalgia, unspecified site: Secondary | ICD-10-CM

## 2012-09-20 DIAGNOSIS — R059 Cough, unspecified: Secondary | ICD-10-CM | POA: Insufficient documentation

## 2012-09-20 DIAGNOSIS — R319 Hematuria, unspecified: Secondary | ICD-10-CM

## 2012-09-20 MED ORDER — ALBUTEROL SULFATE HFA 108 (90 BASE) MCG/ACT IN AERS: 2.0000 | INHALATION_SPRAY | RESPIRATORY_TRACT | Status: DC | PRN

## 2012-09-20 MED ORDER — FLUTICASONE PROPIONATE HFA 110 MCG/ACT IN AERO: 2.0000 | INHALATION_SPRAY | Freq: Two times a day (BID) | RESPIRATORY_TRACT | Status: DC

## 2012-09-20 MED ORDER — ALPRAZOLAM 1 MG PO TABS: 1.0000 mg | ORAL_TABLET | Freq: Two times a day (BID) | ORAL | Status: DC | PRN

## 2012-09-20 NOTE — Telephone Encounter (Signed)
Pt is aware of OV on 11/02/12 at 0900 with [facility] and appt card was mailed

## 2012-09-20 NOTE — Patient Instructions (Addendum)
Chest xray to be done Start Flovent for your asthma Use albuterol as needed only for rescue inhaler Labs to be added to your blood drawn We will call with dose of magnesium F/U 3 months or as needed

## 2012-09-21 ENCOUNTER — Encounter: Payer: Self-pay | Admitting: Family Medicine

## 2012-09-21 DIAGNOSIS — R319 Hematuria, unspecified: Secondary | ICD-10-CM | POA: Insufficient documentation

## 2012-09-21 DIAGNOSIS — N182 Chronic kidney disease, stage 2 (mild): Secondary | ICD-10-CM | POA: Insufficient documentation

## 2012-09-21 NOTE — Assessment & Plan Note (Signed)
She had Cr 1.7 in Oct, recheck in Nov 1.0 at [facility], now with hematuria and elevated urinary creatinine. I will obtain all lab results, will check renal function panel, agree with referral to urology

## 2012-09-21 NOTE — Progress Notes (Signed)
  Subjective:    Patient ID: [name], female    DOB: 07-Jun-1951, 61 y.o.   MRN: [id number]  HPI  Pt here to f/u chronic medical problems, she is very complex and is under the care of multiple specialist. She was found to have UTI, treated with macrobid by her GYN, at the time she had large amount of hematuria, urine studies were done by GI and she was referred to urology but I do not see this results of this. THere was some concern her total creatinine was elevated.   Cough non productive x 1 month, has been wheezing ome but uses albuterol and it improves, albuterol makes her very jittery she has been using daily for many months. No fever, no SOB, cough med helps some  Muscle cramps/neuralgias/spasms- evaluated by neuro told her magnesium was low and this was the cause but does not know how much to take Review of Systems   GEN- denies fatigue, fever, weight loss,weakness, recent illness HEENT- denies eye drainage, change in vision, nasal discharge, CVS- denies chest pain, palpitations RESP- denies SOB, cough, wheeze ABD- denies N/V, change in stools, abd pain GU- denies dysuria, hematuria, dribbling, incontinence, + diarrhea MSK- denies joint pain, muscle aches, injury Neuro- denies headache, dizziness, syncope, seizure activity     Objective:   Physical Exam  GEN- NAD, alert and oriented x3 HEENT- PERRL, EOMI, non injected sclera, pink conjunctiva, MMM, oropharynx mild injection, TM clear bilat no effusion,  No  maxillary sinus tenderness, + Nasal drainage  Neck- Supple, no LAD CVS- RRR, no murmur RESP-CTAB, harsh dry cough, mild wheeze with forced output EXT- No edema Pulses- Radial 2+         Assessment & Plan:

## 2012-09-21 NOTE — Assessment & Plan Note (Signed)
She has had hemturia in the past, was treated for bleeding into right kidney in 70's, she states it was due to "stress" , defer to urology for work-up

## 2012-09-21 NOTE — Assessment & Plan Note (Signed)
CXR obtained, mild bronchitic changes shown, will add flovent to her regimen, use albuterol as rescue inhaler only If she worsens, course of prednisone, zpak

## 2012-09-21 NOTE — Assessment & Plan Note (Signed)
Improved some, she is on 253m magnesium,I will obtain records and we will get her the correct dose for replacement

## 2012-09-23 LAB — RENAL FUNCTION PANEL
BUN: 24 mg/dL — ABNORMAL HIGH (ref 6–23)
CO2: 19 mEq/L (ref 19–32)
Glucose, Bld: 114 mg/dL — ABNORMAL HIGH (ref 70–99)
Potassium: 4.3 mEq/L (ref 3.5–5.3)
Sodium: 136 mEq/L (ref 135–145)

## 2012-09-26 ENCOUNTER — Other Ambulatory Visit: Payer: Self-pay | Admitting: Family Medicine

## 2012-09-26 MED ORDER — MAGNESIUM OXIDE 400 MG PO TABS: 400.0000 mg | ORAL_TABLET | Freq: Two times a day (BID) | ORAL | Status: DC

## 2012-09-29 NOTE — Progress Notes (Signed)
Message left and for pt to call back with any questions

## 2012-10-07 ENCOUNTER — Encounter: Payer: Self-pay | Admitting: Gastroenterology

## 2012-10-10 ENCOUNTER — Ambulatory Visit (HOSPITAL_COMMUNITY): Payer: BC Managed Care – PPO

## 2012-10-21 ENCOUNTER — Other Ambulatory Visit: Payer: Self-pay

## 2012-10-21 MED ORDER — FLUTICASONE PROPIONATE HFA 110 MCG/ACT IN AERO: 2.0000 | INHALATION_SPRAY | Freq: Two times a day (BID) | RESPIRATORY_TRACT | Status: DC

## 2012-10-21 MED ORDER — MAGNESIUM OXIDE 400 MG PO TABS: 400.0000 mg | ORAL_TABLET | Freq: Two times a day (BID) | ORAL | Status: DC

## 2012-10-21 MED ORDER — ALBUTEROL SULFATE HFA 108 (90 BASE) MCG/ACT IN AERS: 2.0000 | INHALATION_SPRAY | RESPIRATORY_TRACT | Status: DC | PRN

## 2012-10-21 MED ORDER — ZOLPIDEM TARTRATE 10 MG PO TABS: 10.0000 mg | ORAL_TABLET | Freq: Every day | ORAL | Status: DC

## 2012-10-21 MED ORDER — FENOFIBRATE 145 MG PO TABS: ORAL_TABLET | ORAL | Status: DC

## 2012-10-21 MED ORDER — IRBESARTAN-HYDROCHLOROTHIAZIDE 150-12.5 MG PO TABS: 1.0000 | ORAL_TABLET | Freq: Every day | ORAL | Status: DC

## 2012-10-21 MED ORDER — ALPRAZOLAM 1 MG PO TABS: 1.0000 mg | ORAL_TABLET | Freq: Two times a day (BID) | ORAL | Status: DC | PRN

## 2012-11-01 ENCOUNTER — Encounter: Payer: Self-pay | Admitting: Gastroenterology

## 2012-11-01 ENCOUNTER — Telehealth: Payer: Self-pay | Admitting: Family Medicine

## 2012-11-02 ENCOUNTER — Ambulatory Visit (INDEPENDENT_AMBULATORY_CARE_PROVIDER_SITE_OTHER): Admitting: Gastroenterology

## 2012-11-02 ENCOUNTER — Other Ambulatory Visit: Payer: Self-pay

## 2012-11-02 ENCOUNTER — Ambulatory Visit: Payer: BC Managed Care – PPO | Admitting: Gastroenterology

## 2012-11-02 ENCOUNTER — Encounter: Payer: Self-pay | Admitting: Gastroenterology

## 2012-11-02 VITALS — BP 113/71 | HR 84 | Temp 98.2°F | Ht 67.0 in | Wt 199.6 lb

## 2012-11-02 DIAGNOSIS — R1011 Right upper quadrant pain: Secondary | ICD-10-CM

## 2012-11-02 DIAGNOSIS — K625 Hemorrhage of anus and rectum: Secondary | ICD-10-CM

## 2012-11-02 MED ORDER — IRBESARTAN-HYDROCHLOROTHIAZIDE 150-12.5 MG PO TABS: 1.0000 | ORAL_TABLET | Freq: Every day | ORAL | Status: DC

## 2012-11-02 MED ORDER — BRIMONIDINE TARTRATE 0.2 % OP SOLN: 1.0000 [drp] | Freq: Two times a day (BID) | OPHTHALMIC | Status: DC

## 2012-11-02 MED ORDER — HYOSCYAMINE SULFATE 0.125 MG SL SUBL: SUBLINGUAL_TABLET | SUBLINGUAL | Status: DC

## 2012-11-02 MED ORDER — HYDROCORTISONE ACETATE 25 MG RE SUPP: 25.0000 mg | Freq: Two times a day (BID) | RECTAL | Status: DC

## 2012-11-02 MED ORDER — LATANOPROST 0.005 % OP SOLN: 1.0000 [drp] | Freq: Every day | OPHTHALMIC | Status: DC

## 2012-11-02 NOTE — Progress Notes (Signed)
Faxed to PCP

## 2012-11-02 NOTE — Patient Instructions (Signed)
SEE DR. BRADFORD TO EVALUATE FOR RUQ PAIN AND POSSIBLE HERNIA.  TAKE LEVSIN AS NEEDED FOR ABDOMINAL CRAMPS.  IT MAY CAUSE DROWSINESS, DRY EYES/MOUTH, BLURRY VISION, CONSTIPATION, OR DIFFICULTY URINATING.  USE ANUSOL SUPPOSITORIES TWICE DAILY FOR 7 DAYS AND REPEAT FOR ANOTHER 12 DAYS IF YOUR RECTAL BLEEDING DOES NOT RESOLVE.  [place].  FOLLOW UP IN 6 WEEKS.

## 2012-11-02 NOTE — Assessment & Plan Note (Signed)
DIFFERENTIAL DIAGNOSIS INCLUDES ABD WALL HERNIA V. IBS FLARE/SPASM  ADD LEVSIN PRN. SIDE EFFECTS DISCUSSED. [place]. SEE DR. BRADFORD FOR HERNIA EVAL. FOLLOW UP IN 6 WEEKS.
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
## 2012-11-02 NOTE — Telephone Encounter (Signed)
The patient was missing 3 of her meds from [facility]. Resent back this am and patient is aware

## 2012-11-02 NOTE — Assessment & Plan Note (Addendum)
MOST LIKELY DUE TO HEMORRHOIDS.  DISCUSSED MANAGEMENT OPTIONS: 1. MEDICATION 2. [facility] BANDING 3. FLEX SIG/HEMORRHOID BANDING. PT ELECTED TO TRY MEDICAL MANAGEMENT. ANUSOL BID FOR 12-24 DAYS. [place]. OPV IN 6 WEEKS. IF SX NOT RESOLVED CONSIDER FLEX SIG/HEMORRHOID BANDING V. [facility] BANDING.

## 2012-11-02 NOTE — Progress Notes (Signed)
Patient will see Dr. Romona Curls on Wednesday Feb 5th at 10:00 am and I have faxed notes over

## 2012-11-02 NOTE — Progress Notes (Signed)
Pt is aware of OV on 3/12 at 230 with SF

## 2012-11-04 ENCOUNTER — Ambulatory Visit (HOSPITAL_COMMUNITY)
Admission: RE | Admit: 2012-11-04 | Discharge: 2012-11-04 | Disposition: A | Discharge status: Home / Self Care | Source: Ambulatory Visit | Attending: Family Medicine | Admitting: Family Medicine

## 2012-11-04 DIAGNOSIS — Z1231 Encounter for screening mammogram for malignant neoplasm of breast: Secondary | ICD-10-CM | POA: Insufficient documentation

## 2012-11-04 DIAGNOSIS — Z139 Encounter for screening, unspecified: Secondary | ICD-10-CM

## 2012-11-24 ENCOUNTER — Ambulatory Visit: Payer: BC Managed Care – PPO | Admitting: Family Medicine

## 2012-11-29 ENCOUNTER — Telehealth: Payer: Self-pay | Admitting: Family Medicine

## 2012-11-30 NOTE — Telephone Encounter (Signed)
Patient aware.

## 2012-12-06 ENCOUNTER — Ambulatory Visit: Admitting: Family Medicine

## 2012-12-14 ENCOUNTER — Ambulatory Visit: Admitting: Gastroenterology

## 2013-01-04 ENCOUNTER — Telehealth: Payer: Self-pay | Admitting: Family Medicine

## 2013-01-05 NOTE — Telephone Encounter (Signed)
Noted  

## 2013-01-11 ENCOUNTER — Ambulatory Visit: Admitting: Gastroenterology

## 2013-01-18 ENCOUNTER — Encounter: Payer: Self-pay | Admitting: *Deleted

## 2013-02-07 ENCOUNTER — Encounter: Payer: Self-pay | Admitting: Family Medicine

## 2013-02-07 ENCOUNTER — Ambulatory Visit (INDEPENDENT_AMBULATORY_CARE_PROVIDER_SITE_OTHER): Admitting: Family Medicine

## 2013-02-07 VITALS — BP 124/70 | HR 96 | Resp 18 | Ht 66.75 in | Wt 203.0 lb

## 2013-02-07 DIAGNOSIS — R1011 Right upper quadrant pain: Secondary | ICD-10-CM

## 2013-02-07 DIAGNOSIS — E669 Obesity, unspecified: Secondary | ICD-10-CM

## 2013-02-07 DIAGNOSIS — R599 Enlarged lymph nodes, unspecified: Secondary | ICD-10-CM

## 2013-02-07 DIAGNOSIS — F419 Anxiety disorder, unspecified: Secondary | ICD-10-CM

## 2013-02-07 DIAGNOSIS — R739 Hyperglycemia, unspecified: Secondary | ICD-10-CM

## 2013-02-07 DIAGNOSIS — R7309 Other abnormal glucose: Secondary | ICD-10-CM

## 2013-02-07 DIAGNOSIS — E785 Hyperlipidemia, unspecified: Secondary | ICD-10-CM

## 2013-02-07 DIAGNOSIS — F411 Generalized anxiety disorder: Secondary | ICD-10-CM

## 2013-02-07 DIAGNOSIS — E6609 Other obesity due to excess calories: Secondary | ICD-10-CM | POA: Insufficient documentation

## 2013-02-07 DIAGNOSIS — K219 Gastro-esophageal reflux disease without esophagitis: Secondary | ICD-10-CM

## 2013-02-07 DIAGNOSIS — I10 Essential (primary) hypertension: Secondary | ICD-10-CM

## 2013-02-07 DIAGNOSIS — R7989 Other specified abnormal findings of blood chemistry: Secondary | ICD-10-CM

## 2013-02-07 DIAGNOSIS — N289 Disorder of kidney and ureter, unspecified: Secondary | ICD-10-CM

## 2013-02-07 MED ORDER — HYDROCHLOROTHIAZIDE 12.5 MG PO TABS: 12.5000 mg | ORAL_TABLET | Freq: Every day | ORAL | Status: DC

## 2013-02-07 MED ORDER — IRBESARTAN 150 MG PO TABS: 150.0000 mg | ORAL_TABLET | Freq: Every day | ORAL | Status: DC

## 2013-02-07 MED ORDER — BRIMONIDINE TARTRATE 0.2 % OP SOLN: 1.0000 [drp] | Freq: Two times a day (BID) | OPHTHALMIC | Status: DC

## 2013-02-07 MED ORDER — FLUOXETINE HCL 10 MG PO CAPS: 10.0000 mg | ORAL_CAPSULE | Freq: Every day | ORAL | Status: DC

## 2013-02-07 MED ORDER — GABAPENTIN 100 MG PO CAPS: 100.0000 mg | ORAL_CAPSULE | Freq: Two times a day (BID) | ORAL | Status: DC

## 2013-02-07 MED ORDER — LATANOPROST 0.005 % OP SOLN: 1.0000 [drp] | Freq: Every day | OPHTHALMIC | Status: DC

## 2013-02-07 MED ORDER — FENOFIBRATE 145 MG PO TABS: ORAL_TABLET | ORAL | Status: DC

## 2013-02-07 MED ORDER — ZOLPIDEM TARTRATE 10 MG PO TABS: 10.0000 mg | ORAL_TABLET | Freq: Every day | ORAL | Status: DC

## 2013-02-07 MED ORDER — ALPRAZOLAM 1 MG PO TABS: 1.0000 mg | ORAL_TABLET | Freq: Two times a day (BID) | ORAL | Status: DC | PRN

## 2013-02-07 MED ORDER — LANSOPRAZOLE 15 MG PO CPDR: 15.0000 mg | DELAYED_RELEASE_CAPSULE | Freq: Every day | ORAL | Status: DC

## 2013-02-07 NOTE — Patient Instructions (Addendum)
Start prozac 55m daily No Alcohol with medications Take xanax 1 tablet twice a day Get the labs done today Ultrasound of neck to be set up for lymph node Prevacid sent Medications [facility] F/[facility] 4 weeks for anxiety at [facility]

## 2013-02-07 NOTE — Assessment & Plan Note (Signed)
She has gained about 6lbs over past 6 months, limited exercise, states watching her diet, repeat fasting labs today I think stress eating is likley a problem as well as alcohol

## 2013-02-07 NOTE — Progress Notes (Signed)
  Subjective:    Patient ID: [name], female    DOB: 1951-04-12, 62 y.o.   MRN: [id number]  HPI   Pt here to f/u chronic medical problems, Has been under a lot of stress with her husband who is going to treatments for his metastatic cancer. She has had a lot of traveling back-and-forth to [facility]. She was concerned about weight gain. She states that her clothes don't fit and she thinks there something wrong. She would like her thyroid checked as well as her blood sugar. She also complains of a knot on the side of her neck which has been growing. She states at times it tightens up and she cannot speak due to pain. She was also evaluated for her chronic abdominal pain and was sent to general surgery by GI. She was found to have abdominal wall pain and was placed on gabapentin 100 mg twice a day which she states has improved her pain. She was also continued on Prevacid which is helps some of her reflux symptoms as long as she does not eat any tomato-based products, this is becoming expensive for her.  She uses xanax to help with stress and this helps calm her. She also drinks vodka and beer but states she is cutting back ( 1-2  A day) after husband offered this information   Review of Systems   GEN- denies fatigue, fever, weight loss,weakness, recent illness HEENT- denies eye drainage, change in vision, nasal discharge, CVS- denies chest pain, palpitations RESP- denies SOB, cough, wheeze ABD- denies N/V, change in stools,+ abd pain GU- denies dysuria, hematuria, dribbling, incontinence MSK- denies joint pain, muscle aches, injury Neuro- denies headache, dizziness, syncope, seizure activity      Objective:   Physical Exam  GEN- NAD, alert and oriented x3, + weight gain  HEENT- PERRL, EOMI, non injected sclera, pink conjunctiva, MMM, oropharynx clear Neck- Supple, no thyromegaly, submandibular lymph node felt mild TTP Right side of neck CVS- RRR, no  murmur RESP-CTAB ABD-NABS,soft, mild TTP RUQ, no rebound, no guarding EXT- No edema Pulses- Radial, DP- 2+        Assessment & Plan:

## 2013-02-07 NOTE — Assessment & Plan Note (Signed)
Chronic pain, improves with gabapentin, will continue at BID dosing Continue prevacid

## 2013-02-07 NOTE — Assessment & Plan Note (Signed)
Obtain US of neck, very fearful of cancer, growing per patient

## 2013-02-07 NOTE — Assessment & Plan Note (Signed)
Recheck Cr, last Cr improved to baseline 1.0

## 2013-02-07 NOTE — Assessment & Plan Note (Signed)
Her anxiety is not very well controlled right now and she is having a lot of stress due to her husband's cancer treatments. Her husband LAD on that he is concerned she has been drinking more than usual as well which includes martinis and Vodka. We discussed her alcohol intake, advised no alcohol with her medications as they can cause severe interactions. I will start her on low-dose Prozac 10 mg I will have her take her  alprazolam twice a day

## 2013-02-07 NOTE — Assessment & Plan Note (Signed)
Well controlled,

## 2013-02-07 NOTE — Assessment & Plan Note (Signed)
Improved with Prevacid, script written due to cost Out of pocket

## 2013-02-08 ENCOUNTER — Other Ambulatory Visit: Payer: Self-pay | Admitting: Gastroenterology

## 2013-02-08 ENCOUNTER — Encounter: Payer: Self-pay | Admitting: Internal Medicine

## 2013-02-08 ENCOUNTER — Encounter: Payer: Self-pay | Admitting: Gastroenterology

## 2013-02-08 ENCOUNTER — Ambulatory Visit (INDEPENDENT_AMBULATORY_CARE_PROVIDER_SITE_OTHER): Admitting: Gastroenterology

## 2013-02-08 ENCOUNTER — Encounter (HOSPITAL_COMMUNITY): Payer: Self-pay | Admitting: Pharmacy Technician

## 2013-02-08 VITALS — BP 105/73 | HR 83 | Temp 98.4°F | Ht 65.0 in | Wt 204.2 lb

## 2013-02-08 DIAGNOSIS — K589 Irritable bowel syndrome without diarrhea: Secondary | ICD-10-CM

## 2013-02-08 DIAGNOSIS — R1011 Right upper quadrant pain: Secondary | ICD-10-CM

## 2013-02-08 DIAGNOSIS — K219 Gastro-esophageal reflux disease without esophagitis: Secondary | ICD-10-CM

## 2013-02-08 LAB — COMPREHENSIVE METABOLIC PANEL
AST: 45 U/L — ABNORMAL HIGH (ref 0–37)
Alkaline Phosphatase: 42 U/L (ref 39–117)
BUN: 25 mg/dL — ABNORMAL HIGH (ref 6–23)
Creat: 1.13 mg/dL — ABNORMAL HIGH (ref 0.50–1.10)

## 2013-02-08 LAB — LIPID PANEL
Total CHOL/HDL Ratio: 5.5 Ratio
VLDL: 66 mg/dL — ABNORMAL HIGH (ref 0–40)

## 2013-02-08 LAB — CBC
HCT: 36.6 % (ref 36.0–46.0)
Hemoglobin: 12.5 g/dL (ref 12.0–15.0)
MCH: 33.4 pg (ref 26.0–34.0)
MCHC: 34.2 g/dL (ref 30.0–36.0)

## 2013-02-08 LAB — HEMOGLOBIN A1C
Hgb A1c MFr Bld: 4.8 % (ref <5.7)
Mean Plasma Glucose: 91 mg/dL (ref <117)

## 2013-02-08 MED ORDER — HYOSCYAMINE SULFATE 0.125 MG SL SUBL: SUBLINGUAL_TABLET | SUBLINGUAL | Status: DC

## 2013-02-08 MED ORDER — LANSOPRAZOLE 30 MG PO CPDR: DELAYED_RELEASE_CAPSULE | ORAL | Status: DC

## 2013-02-08 NOTE — Progress Notes (Signed)
Forwarded to PCP.

## 2013-02-08 NOTE — Patient Instructions (Signed)
USE LEVSIN ONE OR TWO PILLS PRIOR T MEALS AND AT BEDTIME.  CONTINUE PREVCID.  TAKE 30 MINUTES PRIOR TO YOUR FIRST MEAL.  FOLLOW A LOW FAT DIET. SEE INFO BELOW.   COMPLETE GIVENS STUDY AFTER PRIOR AUTHORIZATION OBTAINED FROM INSURANCE CO. YOU WILL BE SCHEDULED FOR MAY 9.  FOLLOW UP IN 3 MOS.   Low-Fat Diet BREADS, CEREALS, PASTA, RICE, DRIED PEAS, AND BEANS These products are high in carbohydrates and most are low in fat. Therefore, they can be increased in the diet as substitutes for fatty foods. They too, however, contain calories and should not be eaten in excess. Cereals can be eaten for snacks as well as for breakfast.   FRUITS AND VEGETABLES It is good to eat fruits and vegetables. Besides being sources of fiber, both are rich in vitamins and some minerals. They help you get the daily allowances of these nutrients. Fruits and vegetables can be used for snacks and desserts.  MEATS Limit lean meat, chicken, [place], and fish to no more than 6 ounces per day. Beef, Pork, and Lamb Use lean cuts of beef, pork, and lamb. Lean cuts include:  Extra-lean ground beef.  Arm roast.  Sirloin tip.  Center-cut ham.  Round steak.  Loin chops.  Rump roast.  Tenderloin.  Trim all fat off the outside of meats before cooking. It is not necessary to severely decrease the intake of red meat, but lean choices should be made. Lean meat is rich in protein and contains a highly absorbable form of iron. Premenopausal women, in particular, should avoid reducing lean red meat because this could increase the risk for low red blood cells (iron-deficiency anemia).  Chicken and [place] These are good sources of protein. The fat of poultry can be reduced by removing the skin and underlying fat layers before cooking. Chicken and [place] can be substituted for lean red meat in the diet. Poultry should not be fried or covered with high-fat sauces. Fish and Shellfish Fish is a good source of protein. Shellfish contain  cholesterol, but they usually are low in saturated fatty acids. The preparation of fish is important. Like chicken and [place], they should not be fried or covered with high-fat sauces. EGGS Egg whites contain no fat or cholesterol. They can be eaten often. Try 1 to 2 egg whites instead of whole eggs in recipes or use egg substitutes that do not contain yolk. MILK AND DAIRY PRODUCTS Use skim or 1% milk instead of 2% or whole milk. Decrease whole milk, natural, and processed cheeses. Use nonfat or low-fat (2%) cottage cheese or low-fat cheeses made from vegetable oils. Choose nonfat or low-fat (1 to 2%) yogurt. Experiment with evaporated skim milk in recipes that call for heavy cream. Substitute low-fat yogurt or low-fat cottage cheese for sour cream in dips and salad dressings. Have at least 2 servings of low-fat dairy products, such as 2 glasses of skim (or 1%) milk each day to help get your daily calcium intake. FATS AND OILS Reduce the total intake of fats, especially saturated fat. Butterfat, lard, and beef fats are high in saturated fat and cholesterol. These should be avoided as much as possible. Vegetable fats do not contain cholesterol, but certain vegetable fats, such as coconut oil, palm oil, and palm kernel oil are very high in saturated fats. These should be limited. These fats are often used in bakery goods, processed foods, popcorn, oils, and nondairy creamers. Vegetable shortenings and some peanut butters contain hydrogenated oils, which are also  saturated fats. Read the labels on these foods and check for saturated vegetable oils. Unsaturated vegetable oils and fats do not raise blood cholesterol. However, they should be limited because they are fats and are high in calories. Total fat should still be limited to 30% of your daily caloric intake. Desirable liquid vegetable oils are corn oil, cottonseed oil, olive oil, canola oil, safflower oil, soybean oil, and sunflower oil. Peanut oil is not  as good, but small amounts are acceptable. Buy a heart-healthy tub margarine that has no partially hydrogenated oils in the ingredients. Mayonnaise and salad dressings often are made from unsaturated fats, but they should also be limited because of their high calorie and fat content. Seeds, nuts, peanut butter, olives, and avocados are high in fat, but the fat is mainly the unsaturated type. These foods should be limited mainly to avoid excess calories and fat. OTHER EATING TIPS Snacks  Most sweets should be limited as snacks. They tend to be rich in calories and fats, and their caloric content outweighs their nutritional value. Some good choices in snacks are graham crackers, melba toast, soda crackers, bagels (no egg), English muffins, fruits, and vegetables. These snacks are preferable to snack crackers, [place] fries, TORTILLA CHIPS, and POTATO chips. Popcorn should be air-popped or cooked in small amounts of liquid vegetable oil. Desserts Eat fruit, low-fat yogurt, and fruit ices instead of pastries, cake, and cookies. Sherbet, angel food cake, gelatin dessert, frozen low-fat yogurt, or other frozen products that do not contain saturated fat (pure fruit juice bars, frozen ice pops) are also acceptable.  COOKING METHODS Choose those methods that use little or no fat. They include: Poaching.  Braising.  Steaming.  Grilling.  Baking.  Stir-frying.  Broiling.  Microwaving.  Foods can be cooked in a nonstick pan without added fat, or use a nonfat cooking spray in regular cookware. Limit fried foods and avoid frying in saturated fat. Add moisture to lean meats by using water, broth, cooking wines, and other nonfat or low-fat sauces along with the cooking methods mentioned above. Soups and stews should be chilled after cooking. The fat that forms on top after a few hours in the refrigerator should be skimmed off. When preparing meals, avoid using excess salt. Salt can contribute to raising blood  pressure in some people.  EATING AWAY FROM HOME Order entres, potatoes, and vegetables without sauces or butter. When meat exceeds the size of a deck of cards (3 to 4 ounces), the rest can be taken home for another meal. Choose vegetable or fruit salads and ask for low-calorie salad dressings to be served on the side. Use dressings sparingly. Limit high-fat toppings, such as bacon, crumbled eggs, cheese, sunflower seeds, and olives. Ask for heart-healthy tub margarine instead of butter.

## 2013-02-08 NOTE — Progress Notes (Signed)
Reminder in epic °

## 2013-02-08 NOTE — Assessment & Plan Note (Signed)
IN SETTING OF NSAID USE AND WEIGHT GAIN. DIFFERENTIAL DIAGNOSIS INCLUDES IBD, NSAID ENTEROPATHY, OR UNCONTROLLED IBS.  LEVSIN 1-2 QAC/HS-I YEAR REFILLS OBTAIN GIVENS CONTINUE PREVACID(1 YEAR REFILLS) AND DIET MODIFICATION OPV IN 3 MOS
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
## 2013-02-08 NOTE — Assessment & Plan Note (Signed)
SX NOT IDEALLY CONTROLLED.  INCREASE PREVACID TO 30 MG 30 MINS PRIOR TO MEALS DIET MODIFICATION OPV IN 3 MOS

## 2013-02-08 NOTE — Assessment & Plan Note (Signed)
NOT IDEALLY CONTROLLED OFF LEVSIN. EXACERBATED BY HUSBAND'S ILLNESS.  LEVSIN QAC/HS DIET MODIFICATION OPV IN 3 MO

## 2013-02-10 ENCOUNTER — Ambulatory Visit (HOSPITAL_COMMUNITY)
Admission: RE | Admit: 2013-02-10 | Discharge: 2013-02-10 | Disposition: A | Discharge status: Home / Self Care | Source: Ambulatory Visit | Attending: Gastroenterology | Admitting: Gastroenterology

## 2013-02-10 ENCOUNTER — Encounter (HOSPITAL_COMMUNITY)
Admission: RE | Disposition: A | Discharge status: Home / Self Care | Payer: Self-pay | Source: Ambulatory Visit | Attending: Gastroenterology

## 2013-02-10 ENCOUNTER — Ambulatory Visit (HOSPITAL_COMMUNITY)
Admission: RE | Admit: 2013-02-10 | Discharge: 2013-02-10 | Disposition: A | Discharge status: Home / Self Care | Source: Ambulatory Visit | Attending: Family Medicine | Admitting: Family Medicine

## 2013-02-10 ENCOUNTER — Ambulatory Visit (HOSPITAL_COMMUNITY)

## 2013-02-10 DIAGNOSIS — R197 Diarrhea, unspecified: Secondary | ICD-10-CM

## 2013-02-10 DIAGNOSIS — R109 Unspecified abdominal pain: Secondary | ICD-10-CM

## 2013-02-10 DIAGNOSIS — R599 Enlarged lymph nodes, unspecified: Secondary | ICD-10-CM

## 2013-02-10 DIAGNOSIS — I10 Essential (primary) hypertension: Secondary | ICD-10-CM | POA: Insufficient documentation

## 2013-02-10 DIAGNOSIS — R220 Localized swelling, mass and lump, head: Secondary | ICD-10-CM | POA: Insufficient documentation

## 2013-02-10 HISTORY — PX: GIVENS CAPSULE STUDY: SHX5432

## 2013-02-10 SURGERY — IMAGING PROCEDURE, GI TRACT, INTRALUMINAL, VIA CAPSULE

## 2013-02-14 ENCOUNTER — Encounter (HOSPITAL_COMMUNITY): Payer: Self-pay | Admitting: Gastroenterology

## 2013-02-17 ENCOUNTER — Ambulatory Visit: Admitting: Internal Medicine

## 2013-02-17 NOTE — Addendum Note (Signed)
Addended by: Denman George B on: 02/17/2013 09:08 AM   Modules accepted: Orders

## 2013-02-28 ENCOUNTER — Ambulatory Visit (INDEPENDENT_AMBULATORY_CARE_PROVIDER_SITE_OTHER): Admitting: Internal Medicine

## 2013-02-28 ENCOUNTER — Telehealth: Payer: Self-pay | Admitting: Family Medicine

## 2013-02-28 ENCOUNTER — Encounter: Payer: Self-pay | Admitting: Internal Medicine

## 2013-02-28 VITALS — BP 110/68 | HR 87 | Ht 67.0 in | Wt 204.0 lb

## 2013-02-28 DIAGNOSIS — I10 Essential (primary) hypertension: Secondary | ICD-10-CM

## 2013-02-28 DIAGNOSIS — F419 Anxiety disorder, unspecified: Secondary | ICD-10-CM

## 2013-02-28 DIAGNOSIS — F411 Generalized anxiety disorder: Secondary | ICD-10-CM

## 2013-02-28 DIAGNOSIS — K760 Fatty (change of) liver, not elsewhere classified: Secondary | ICD-10-CM

## 2013-02-28 DIAGNOSIS — K7689 Other specified diseases of liver: Secondary | ICD-10-CM

## 2013-02-28 DIAGNOSIS — E785 Hyperlipidemia, unspecified: Secondary | ICD-10-CM | POA: Insufficient documentation

## 2013-02-28 DIAGNOSIS — R002 Palpitations: Secondary | ICD-10-CM

## 2013-02-28 DIAGNOSIS — K7581 Nonalcoholic steatohepatitis (NASH): Secondary | ICD-10-CM | POA: Insufficient documentation
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
## 2013-02-28 NOTE — Patient Instructions (Signed)
Your physician wants you to follow-up in: 6 MONTHS You will receive a reminder letter in the mail two months in advance. If you don't receive a letter, please call our office to schedule the follow-up appointment. 

## 2013-02-28 NOTE — Telephone Encounter (Signed)
patient has called [name] they are processing scripts

## 2013-03-01 ENCOUNTER — Telehealth: Payer: Self-pay | Admitting: Family Medicine

## 2013-03-01 ENCOUNTER — Other Ambulatory Visit: Payer: Self-pay

## 2013-03-01 MED ORDER — IRBESARTAN-HYDROCHLOROTHIAZIDE 150-12.5 MG PO TABS: 1.0000 | ORAL_TABLET | Freq: Every day | ORAL | Status: DC

## 2013-03-01 NOTE — Telephone Encounter (Signed)
Patient states at last visit prevacid was to be changed to a higher dose and sent to [facility].  Please advise.

## 2013-03-02 MED ORDER — LANSOPRAZOLE 30 MG PO CPDR: 30.0000 mg | DELAYED_RELEASE_CAPSULE | Freq: Every day | ORAL | Status: DC | PRN

## 2013-03-02 NOTE — Telephone Encounter (Signed)
Prevacid was changed, will send again

## 2013-03-03 ENCOUNTER — Encounter (HOSPITAL_COMMUNITY): Payer: Self-pay | Admitting: Pharmacy Technician

## 2013-03-03 ENCOUNTER — Telehealth: Payer: Self-pay | Admitting: Gastroenterology

## 2013-03-03 ENCOUNTER — Other Ambulatory Visit: Payer: Self-pay | Admitting: Gastroenterology

## 2013-03-03 NOTE — Telephone Encounter (Signed)
Called patient TO DISCUSS RESULTS. SHE HAS SMALL GASTRIC AND SMALL BOWEL ULCERS. SHE NEEDS ENTEROSCOPY/BIOPSY WITH PROPOFOL June 10.

## 2013-03-03 NOTE — Telephone Encounter (Signed)
Patient is scheduled for June 10th w/SLF

## 2013-03-03 NOTE — Brief Op Note (Signed)
02/10/2013  10:33 AM  PATIENT:  Alvie Heidelberg Jarchow  62 y.o. female  PRE-OPERATIVE DIAGNOSIS:  Abdominal Pain and Diarrhea  POST-OPERATIVE DIAGNOSIS:  GASTRIC AND SMALL BOWEL ULCERS  PROCEDURE:  Procedure(s) with comments: GIVENS CAPSULE STUDY (N/A) - 730  SURGEON:  Surgeon(s) and Role:    * Danie Binder, MD - Primary   PATIENT DATA: 205 LBS, HEIGHT: 63 IN, WAIST: 44  IN, SB PASSAGE TIME: [place] 8m RESULTS: LIMITED views of gastric mucosa due to retained contents. OCCASIONAL ULCER SEEN IN STOMACH. NORMAL MULTIPLE ULCERS IN SMALL BOWEL BEGINNING 26:32 AND EXTENDIG TO 4:00:14. No masses, or AVMs SEEN. NO OLD BLOOD OR FRESH BLOOD SEEN. LIMITED VIEWS OF THE COLON DUE TO RETAINED CONTENTS.  DIAGNOSIS: SMALL BOWEL AND GASTRIC ULCERS  Plan: 1. ENTEROSCOPY WITHIN THE NEXT 2 WEEKS 2. AVOID ASA/NSAIDS FOR NEXT 4 MOS 3. OPV IN 4 MOS

## 2013-03-06 NOTE — Telephone Encounter (Signed)
Pt did ask to speak back to me and she wants to know if Dr. Oneida Alar plans on doing another EGD any time soon. She said that she is still having blood in her stool. Please advise!

## 2013-03-06 NOTE — Telephone Encounter (Signed)
Correction to the note: Pt wants to know if another colonoscopy will be planned soon.

## 2013-03-06 NOTE — Telephone Encounter (Signed)
Pt called and was informed of the results. She said that She barely missed Dr. Oneida Alar call. She had some questions about her appt and pre op and Marliss Czar Lelon Frohlich is speaking to her to give that info.

## 2013-03-07 NOTE — Telephone Encounter (Signed)
Called patient TO DISCUSS RESULTS AND ANSWER QUESTIONS. WAS TAKING ASPIRIN DAILY FOR THE PAST YEAR. TAKES IBUPROFEN PRN FOR IBUPROFEN ONCE OR TWICE A MO. LAST TCS MAR 2012. HUSBAND HAS BEEN IN HOSPITAL IN [place] FOR PAST 2-3 WEEKS. YESTERDAY. NOW HAVING MORE RECTAL BLEEDING. NEED FLEX SIG/HEMORRHOID BANDING.  DISCUSSED BENEFITS V. RISKS OF PROCEDURE INCLUDING NECROTIZING PELVIC SEPSIS.

## 2013-03-07 NOTE — Telephone Encounter (Signed)
Patient is scheduled for June 10th in the OR w/SLF and she is aware

## 2013-03-09 ENCOUNTER — Encounter (HOSPITAL_COMMUNITY)
Admission: RE | Admit: 2013-03-09 | Discharge: 2013-03-09 | Disposition: A | Discharge status: Home / Self Care | Source: Ambulatory Visit | Attending: Gastroenterology | Admitting: Gastroenterology

## 2013-03-09 ENCOUNTER — Other Ambulatory Visit: Payer: Self-pay | Admitting: Gastroenterology

## 2013-03-09 ENCOUNTER — Telehealth: Payer: Self-pay | Admitting: Gastroenterology

## 2013-03-09 ENCOUNTER — Encounter (HOSPITAL_COMMUNITY): Payer: Self-pay

## 2013-03-09 HISTORY — DX: Other specified postprocedural states: R11.2

## 2013-03-09 HISTORY — DX: Other specified postprocedural states: Z98.890

## 2013-03-09 HISTORY — DX: Unspecified osteoarthritis, unspecified site: M19.90

## 2013-03-09 HISTORY — DX: Unspecified asthma, uncomplicated: J45.909

## 2013-03-09 HISTORY — DX: Nausea with vomiting, unspecified: R11.2

## 2013-03-09 LAB — BASIC METABOLIC PANEL
CO2: 26 mEq/L (ref 19–32)
Calcium: 10.3 mg/dL (ref 8.4–10.5)
Creatinine, Ser: 1.25 mg/dL — ABNORMAL HIGH (ref 0.50–1.10)
Glucose, Bld: 99 mg/dL (ref 70–99)

## 2013-03-09 NOTE — Telephone Encounter (Signed)
Went over [employer] with her

## 2013-03-09 NOTE — Telephone Encounter (Signed)
PLEASE CALL PT. HER LABS SHOWS A NL BLOOD COUNT. HER KIDNEY FUNCTION IS NOT NORMAL. SHE SHOULD SEE DR. Buelah Manis TO DISCUSS THIS. CERTAIN MEDS CAN MAKE THSI NUMBER ABNORMAL. SHE SHOULD AVOID IBUPROFEN WHICH CAN DAMAGE HER KIDNEYS AND DISCUSS WITH DR. Buelah Manis IF SHE SHOULD CONTINUE AVALIDE.

## 2013-03-09 NOTE — Patient Instructions (Addendum)
    [name]  03/09/2013   Your procedure is scheduled on:  03/14/2013  Report to [facility] at  615  AM.  Call this number if you have problems the morning of surgery: [phone]   Remember:   Do not eat food or drink liquids after midnight.   Take these medicines the morning of surgery with A SIP OF WATER: claritin,xanax,avalide,prevacid. Take your albuterol before you come.   Do not wear jewelry, make-up or nail polish.  Do not wear lotions, powders, or perfumes. You may wear deodorant.  Do not shave 48 hours prior to surgery. Men may shave face and neck.  Do not bring valuables to the hospital.  [facility] is not responsible  for any belongings or valuables.  Contacts, dentures or bridgework may not be worn into surgery.  Leave suitcase in the car. After surgery it may be brought to your room.  For patients admitted to the hospital, checkout time is 11:00 AM the day of discharge.   Patients discharged the day of surgery will not be allowed to drive  home.  Name and phone number of your driver: family  Special Instructions: N/A   Please read over the following fact sheets that you were given: Pain Booklet, Coughing and Deep Breathing, Surgical Site Infection Prevention, Anesthesia Post-op Instructions and Care and Recovery After Surgery PATIENT INSTRUCTIONS POST-ANESTHESIA  IMMEDIATELY FOLLOWING SURGERY:  Do not drive or operate machinery for the first twenty four hours after surgery.  Do not make any important decisions for twenty four hours after surgery or while taking narcotic pain medications or sedatives.  If you develop intractable nausea and vomiting or a severe headache please notify your doctor immediately.  FOLLOW-UP:  Please make an appointment with your surgeon as instructed. You do not need to follow up with anesthesia unless specifically instructed to do so.  WOUND CARE INSTRUCTIONS (if applicable):  Keep a dry clean dressing on the anesthesia/puncture wound site  if there is drainage.  Once the wound has quit draining you may leave it open to air.  Generally you should leave the bandage intact for twenty four hours unless there is drainage.  If the epidural site drains for more than 36-48 hours please call the anesthesia department.  QUESTIONS?:  Please feel free to call your physician or the hospital operator if you have any questions, and they will be happy to assist you.

## 2013-03-09 NOTE — Telephone Encounter (Signed)
Pt called this morning with multiple questions regarding her procedure on 6/10 and she has a pre op visit scheduled for today. I told her that LAW was finishing up with a patient at the moment and that I would have her call patient back at [phone]

## 2013-03-10 ENCOUNTER — Encounter (HOSPITAL_COMMUNITY): Payer: Self-pay | Admitting: Pharmacy Technician

## 2013-03-10 NOTE — Telephone Encounter (Signed)
Called and informed pt.  

## 2013-03-14 ENCOUNTER — Ambulatory Visit (HOSPITAL_COMMUNITY): Admitting: Anesthesiology

## 2013-03-14 ENCOUNTER — Ambulatory Visit (HOSPITAL_COMMUNITY): Admission: RE | Admit: 2013-03-14 | Source: Ambulatory Visit | Admitting: Gastroenterology

## 2013-03-14 ENCOUNTER — Ambulatory Visit: Admit: 2013-03-14 | Payer: Self-pay | Admitting: Gastroenterology

## 2013-03-14 ENCOUNTER — Encounter (HOSPITAL_COMMUNITY): Payer: Self-pay | Admitting: Anesthesiology

## 2013-03-14 ENCOUNTER — Encounter (HOSPITAL_COMMUNITY): Admission: RE | Payer: Self-pay | Source: Ambulatory Visit

## 2013-03-14 ENCOUNTER — Encounter (HOSPITAL_COMMUNITY): Payer: Self-pay

## 2013-03-14 ENCOUNTER — Ambulatory Visit (HOSPITAL_COMMUNITY)
Admission: RE | Admit: 2013-03-14 | Discharge: 2013-03-14 | Disposition: A | Discharge status: Home / Self Care | Source: Ambulatory Visit | Attending: Gastroenterology | Admitting: Gastroenterology

## 2013-03-14 ENCOUNTER — Encounter (HOSPITAL_COMMUNITY)
Admission: RE | Disposition: A | Discharge status: Home / Self Care | Payer: Self-pay | Source: Ambulatory Visit | Attending: Gastroenterology

## 2013-03-14 DIAGNOSIS — K2970 Gastritis, unspecified, without bleeding: Secondary | ICD-10-CM

## 2013-03-14 DIAGNOSIS — I10 Essential (primary) hypertension: Secondary | ICD-10-CM | POA: Insufficient documentation

## 2013-03-14 DIAGNOSIS — D126 Benign neoplasm of colon, unspecified: Secondary | ICD-10-CM

## 2013-03-14 DIAGNOSIS — R109 Unspecified abdominal pain: Secondary | ICD-10-CM

## 2013-03-14 DIAGNOSIS — K620 Anal polyp: Secondary | ICD-10-CM

## 2013-03-14 DIAGNOSIS — K2990 Gastroduodenitis, unspecified, without bleeding: Secondary | ICD-10-CM

## 2013-03-14 DIAGNOSIS — K648 Other hemorrhoids: Secondary | ICD-10-CM | POA: Insufficient documentation

## 2013-03-14 DIAGNOSIS — Z01812 Encounter for preprocedural laboratory examination: Secondary | ICD-10-CM | POA: Insufficient documentation

## 2013-03-14 DIAGNOSIS — K2980 Duodenitis without bleeding: Secondary | ICD-10-CM | POA: Insufficient documentation

## 2013-03-14 DIAGNOSIS — Z79899 Other long term (current) drug therapy: Secondary | ICD-10-CM | POA: Insufficient documentation

## 2013-03-14 DIAGNOSIS — K625 Hemorrhage of anus and rectum: Secondary | ICD-10-CM

## 2013-03-14 DIAGNOSIS — D128 Benign neoplasm of rectum: Secondary | ICD-10-CM | POA: Insufficient documentation

## 2013-03-14 DIAGNOSIS — K621 Rectal polyp: Secondary | ICD-10-CM

## 2013-03-14 HISTORY — PX: HEMORRHOID BANDING: SHX5850

## 2013-03-14 HISTORY — PX: FLEXIBLE SIGMOIDOSCOPY: SHX5431

## 2013-03-14 HISTORY — PX: BIOPSY: SHX5522

## 2013-03-14 HISTORY — PX: POLYPECTOMY: SHX5525

## 2013-03-14 HISTORY — PX: ENTEROSCOPY: SHX5533

## 2013-03-14 SURGERY — ENTEROSCOPY
Anesthesia: Monitor Anesthesia Care

## 2013-03-14 SURGERY — SIGMOIDOSCOPY, FLEXIBLE
Anesthesia: Monitor Anesthesia Care

## 2013-03-14 MED ORDER — LIDOCAINE HCL 2 % EX GEL: Freq: Once | CUTANEOUS | Status: DC

## 2013-03-14 MED ORDER — FENTANYL CITRATE 0.05 MG/ML IJ SOLN
INTRAMUSCULAR | Status: AC
  Filled 2013-03-14: qty 2

## 2013-03-14 MED ORDER — STERILE WATER FOR IRRIGATION IR SOLN
Status: DC | PRN
  Administered 2013-03-14: 08:00:00

## 2013-03-14 MED ORDER — MIDAZOLAM HCL 2 MG/2ML IJ SOLN
1.0000 mg | INTRAMUSCULAR | Status: DC | PRN
  Administered 2013-03-14 (×2): 2 mg via INTRAVENOUS

## 2013-03-14 MED ORDER — MIDAZOLAM HCL 2 MG/2ML IJ SOLN
INTRAMUSCULAR | Status: AC
  Filled 2013-03-14: qty 2

## 2013-03-14 MED ORDER — LACTATED RINGERS IV SOLN
INTRAVENOUS | Status: DC
  Administered 2013-03-14: 08:00:00 via INTRAVENOUS
  Administered 2013-03-14: 500 mL via INTRAVENOUS

## 2013-03-14 MED ORDER — FENTANYL CITRATE 0.05 MG/ML IJ SOLN
50.0000 ug | Freq: Once | INTRAMUSCULAR | Status: AC
  Administered 2013-03-14: 50 ug via INTRAVENOUS

## 2013-03-14 MED ORDER — PROPOFOL 10 MG/ML IV EMUL
INTRAVENOUS | Status: AC
  Filled 2013-03-14: qty 20

## 2013-03-14 MED ORDER — FENTANYL CITRATE 0.05 MG/ML IJ SOLN: 25.0000 ug | Freq: Once | INTRAMUSCULAR | Status: DC

## 2013-03-14 MED ORDER — ONDANSETRON HCL 4 MG/2ML IJ SOLN
INTRAMUSCULAR | Status: AC
  Filled 2013-03-14: qty 2

## 2013-03-14 MED ORDER — FENTANYL CITRATE 0.05 MG/ML IJ SOLN
25.0000 ug | INTRAMUSCULAR | Status: DC | PRN
  Administered 2013-03-14: 25 ug via INTRAVENOUS

## 2013-03-14 MED ORDER — ONDANSETRON HCL 4 MG/2ML IJ SOLN
4.0000 mg | Freq: Once | INTRAMUSCULAR | Status: AC
  Administered 2013-03-14: 4 mg via INTRAVENOUS

## 2013-03-14 MED ORDER — GLYCOPYRROLATE 0.2 MG/ML IJ SOLN
0.2000 mg | Freq: Once | INTRAMUSCULAR | Status: AC
  Administered 2013-03-14: 0.2 mg via INTRAVENOUS

## 2013-03-14 MED ORDER — GLYCOPYRROLATE 0.2 MG/ML IJ SOLN
INTRAMUSCULAR | Status: AC
  Filled 2013-03-14: qty 1

## 2013-03-14 MED ORDER — PROPOFOL 10 MG/ML IV EMUL
INTRAVENOUS | Status: AC
  Filled 2013-03-14: qty 40

## 2013-03-14 MED ORDER — BUTAMBEN-TETRACAINE-BENZOCAINE 2-2-14 % EX AERO
1.0000 | INHALATION_SPRAY | Freq: Once | CUTANEOUS | Status: AC
  Administered 2013-03-14: 1 via TOPICAL
  Filled 2013-03-14: qty 56

## 2013-03-14 MED ORDER — ONDANSETRON HCL 4 MG/2ML IJ SOLN: 4.0000 mg | Freq: Once | INTRAMUSCULAR | Status: DC | PRN

## 2013-03-14 MED ORDER — FENTANYL CITRATE 0.05 MG/ML IJ SOLN
INTRAMUSCULAR | Status: DC | PRN
  Administered 2013-03-14: 25 ug via INTRAVENOUS
  Administered 2013-03-14: 50 ug via INTRAVENOUS

## 2013-03-14 MED ORDER — PROPOFOL INFUSION 10 MG/ML OPTIME
INTRAVENOUS | Status: DC | PRN
  Administered 2013-03-14: 130 ug/kg/min via INTRAVENOUS
  Administered 2013-03-14: 85 ug/kg/min via INTRAVENOUS
  Administered 2013-03-14: 75 ug/kg/min via INTRAVENOUS

## 2013-03-14 MED ORDER — FENTANYL CITRATE 0.05 MG/ML IJ SOLN
25.0000 ug | INTRAMUSCULAR | Status: DC | PRN
  Administered 2013-03-14 (×4): 50 ug via INTRAVENOUS

## 2013-03-14 MED ORDER — MIDAZOLAM HCL 5 MG/5ML IJ SOLN
INTRAMUSCULAR | Status: DC | PRN
  Administered 2013-03-14 (×2): 2 mg via INTRAVENOUS

## 2013-03-14 MED ORDER — LIDOCAINE HCL 2 % EX GEL
CUTANEOUS | Status: AC
  Filled 2013-03-14: qty 30

## 2013-03-14 SURGICAL SUPPLY — 14 items
BAND LIGATOR SUPER 7 2.8 (MISCELLANEOUS) ×2 IMPLANT
ELECT REM PT RETURN 9FT ADLT (ELECTROSURGICAL) ×2
ELECTRODE REM PT RTRN 9FT ADLT (ELECTROSURGICAL) ×1 IMPLANT
FLOOR PAD 36X40 (MISCELLANEOUS) ×4
FORCEPS BIOP RAD 4 LRG CAP 4 (CUTTING FORCEPS) ×4 IMPLANT
LUBRICANT JELLY 4.5OZ STERILE (MISCELLANEOUS) ×2 IMPLANT
PAD FLOOR 36X40 (MISCELLANEOUS) ×2 IMPLANT
SNARE SHORT THROW 27M MED OVAL (MISCELLANEOUS) ×2 IMPLANT
SYR 50ML LL SCALE MARK (SYRINGE) ×2 IMPLANT
TRAP SPECIMEN CP (MISCELLANEOUS) ×2 IMPLANT
TRAP SPECIMEN MUCOUS 40CC (MISCELLANEOUS) ×2 IMPLANT
TUBING ENDO SMARTCAP PENTAX (MISCELLANEOUS) ×2 IMPLANT
TUBING IRRIGATION ENDOGATOR (MISCELLANEOUS) ×2 IMPLANT
WATER STERILE IRR 1000ML POUR (IV SOLUTION) ×2 IMPLANT

## 2013-03-14 NOTE — Transfer of Care (Signed)
Immediate Anesthesia Transfer of Care Note  Patient: [name]  Procedure(s) Performed: Procedure(s): ENTEROSCOPY WITH PROPOFOL (Procedure#1) (N/A) SMALL BOWEL AND GASTRIC BIOPSIES (Procedure #1) (N/A) FLEXIBLE SIGMOIDOSCOPY WITH PROPOFOL (Procedure #2) (N/A) HEMORRHOID BANDING (Procedure #3)  3 bands applied [id number] Exp 02/02/2014  (N/A) SIGMOID COLON POLYPECTOMIES (with cold biopsy) and RECTAL POLYPECTOMY (with hot snare)  (Procedure #2) (N/A)  Patient Location: PACU  Anesthesia Type:MAC  Level of Consciousness: awake, alert  and patient cooperative  Airway & Oxygen Therapy: Patient Spontanous Breathing and Patient connected to face mask oxygen  Post-op Assessment: Report given to PACU RN and Post -op Vital signs reviewed and stable  Post vital signs: Reviewed and stable  Complications: No apparent anesthesia complications
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
## 2013-03-14 NOTE — Anesthesia Postprocedure Evaluation (Signed)
  Anesthesia Post-op Note  Patient: [name]  Procedure(s) Performed: Procedure(s): ENTEROSCOPY WITH PROPOFOL (Procedure#1) (N/A) SMALL BOWEL AND GASTRIC BIOPSIES (Procedure #1) (N/A) FLEXIBLE SIGMOIDOSCOPY WITH PROPOFOL (Procedure #2) (N/A) HEMORRHOID BANDING (Procedure #3)  3 bands applied [id number] Exp 02/02/2014  (N/A) SIGMOID COLON POLYPECTOMIES (with cold biopsy) and RECTAL POLYPECTOMY (with hot snare)  (Procedure #2) (N/A)  Patient Location: PACU  Anesthesia Type:MAC  Level of Consciousness: awake, alert , oriented and patient cooperative  Airway and Oxygen Therapy: Patient Spontanous Breathing  Post-op Pain: none  Post-op Assessment: Post-op Vital signs reviewed, Patient's Cardiovascular Status Stable, Respiratory Function Stable, Patent Airway, No signs of Nausea or vomiting and Pain level controlled  Post-op Vital Signs: Reviewed and stable  Complications: No apparent anesthesia complications

## 2013-03-14 NOTE — Anesthesia Preprocedure Evaluation (Signed)
Anesthesia Evaluation  Patient identified by MRN, date of birth, ID band Patient awake    Reviewed: Allergy & Precautions, H&P , NPO status , Patient's Chart, lab work & pertinent test results  History of Anesthesia Complications (+) PONV  Airway Mallampati: II TM Distance: >3 FB Neck ROM: Full    Dental  (+) Teeth Intact   Pulmonary asthma ,  breath sounds clear to auscultation        Cardiovascular hypertension, Pt. on medications Rhythm:Regular Rate:Normal     Neuro/Psych Anxiety    GI/Hepatic GERD-  Medicated and Controlled,  Endo/Other    Renal/GU      Musculoskeletal   Abdominal   Peds  Hematology  (+) Blood dyscrasia, , G6PD deficiency    Anesthesia Other Findings   Reproductive/Obstetrics                           Anesthesia Physical Anesthesia Plan  ASA: III  Anesthesia Plan: MAC   Post-op Pain Management:    Induction: Intravenous  Airway Management Planned: Simple Face Mask  Additional Equipment:   Intra-op Plan:   Post-operative Plan:   Informed Consent: I have reviewed the patients History and Physical, chart, labs and discussed the procedure including the risks, benefits and alternatives for the proposed anesthesia with the patient or authorized representative who has indicated his/her understanding and acceptance.     Plan Discussed with:   Anesthesia Plan Comments:         Anesthesia Quick Evaluation

## 2013-03-15 ENCOUNTER — Telehealth: Payer: Self-pay | Admitting: Gastroenterology

## 2013-03-15 NOTE — Op Note (Signed)
[place], [id number]   FLEX SIGMOIDOSCOPY PROCEDURE REPORT  PATIENT: [name], [name]  MR#: [id number] BIRTHDATE: 26-Apr-1951 , 62  yrs. old GENDER: Female ENDOSCOPIST: Barney Drain, MD REFERRED IH:[id number] [place], M.D. PROCEDURE DATE:  03/14/2013 PROCEDURE:   Sigmoidoscopy with snare/COLD FORCEPS POLYPECTOMY, Hemorrhoidectomy via banding INDICATIONS:rectal bleeding. MEDICATIONS: MAC sedation, administered by CRNA  DESCRIPTION OF PROCEDURE:    Physical exam was performed.  Informed consent was obtained from the patient after explaining the benefits, risks, and alternatives to procedure.  The patient was connected to monitor and placed in left lateral position. Continuous oxygen was provided by nasal cannula and IV medicine administered through an indwelling cannula.  After administration of sedation and rectal exam, the patients rectum was intubated and the     colonoscope was advanced under direct visualization to the descending colon. The scope was removed slowly by carefully examining the color, texture, anatomy, and integrity mucosa on the way out.  The patient was recovered in endoscopy and discharged home in satisfactory condition.    COLON FINDINGS: Three sessile polyps ranging between 3-70m in size were found IN THE SIGMOID COLON(2) AND RECTUM.  A polypectomy was performed using snare cautery and with cold forceps and Moderate sized internal hemorrhoids were found.    3 bands placed sucessfully.  PREP QUALITY: good      COMPLICATIONS: pt experienced abdominal pain and rectal pain after FLX SIG. BANDS MANIPULATED AND PAIN IMPROVED. ALSO GIVEN FENTANYL 75 MCG INPOST-OP  ENDOSCOPIC IMPRESSION: 1.   Three COLON polyps REMOVED 2.   Moderate sized internal hemorrhoids  RECOMMENDATIONS: CALL [phone] RECTAL BLEEDING OR RECTAL PAIN GETS WORSE OR FOR FEVER, OR PROBLEMS URINATING. Use lidocaine jelly to relieve rectal  pain. USE IBUPROFEN OTC 2 PO Q6H PRN FOR RECTAL PAIN AND TYLENOL AS NEEDED FOR PAIN.  TAKE MEDS WITH FOOD. FOLLOW A LOW RESIDUE DIET UNTIL FOLLOW UP APPT.  FOLLOW UP IN JUL 2 AT 2 PM.       _______________________________ eLorrin MaisBarney Drain MD 03/14/2013 4:27 PM

## 2013-03-15 NOTE — Op Note (Signed)
[facility] [address] [place], [address]   OPERATIVE PROCEDURE REPORT  PATIENT: [name], [name]  MR#: [id number] BIRTHDATE: 1950/10/07 , 62  yrs. old GENDER: Female ENDOSCOPIST: Barney Drain, MD REFERRED BY:  [name], M.D. PROCEDURE DATE: 03/14/2013 PROCEDURE:   Small bowel enteroscopy with biopsy ASA CLASS: INDICATIONS:1.  abdominal pain. GIVENS Feb 07, 2013 SHOWS GASTRIC SMALL BOWEL ULCERS/HB 12.5 MEDICATIONS: MAC sedation, administered by CRNA TOPICAL ANESTHETIC:   Cetacaine Spray  DESCRIPTION OF PROCEDURE:   After the risks benefits and alternatives of the procedure were thoroughly explained, informed consent was obtained.  The     endoscope was introduced through the mouth  and advanced to the proximal jejunum jejunum , limited by Without limitations.   The instrument was slowly withdrawn as the mucosa was fully examined.    Gastritis was found . NORMAL ESOPHAGUS/SMALL BOWEL. BIOPSIES OBTAINED IN THE STOMACH AND SMALL BOWEL TO EVALUATE FOR EOSINOPHILIC GASTROENTERITIS.  NO ULCERS SEEN IN SMALL BOWEL OR STOMACH. Retroflexed views revealed no abnormalities.    The scope was then withdrawn from the patient and the procedure terminated.  COMPLICATIONS: There were no complications. ENDOSCOPIC IMPRESSION: MILD Gastritis . ULCERS SEEN ON MAY 6 HAVE HEALED.  RECOMMENDATIONS: CONTINUE PREVACID. AWAIT BIOPSY. OPV IN 3-4 MOS.  REPEAT EXAM:  _______________________________ Lorrin MaisBarney Drain, MD 03/14/2013 4:20 PM   CC:

## 2013-03-15 NOTE — Telephone Encounter (Signed)
Cc PCP 

## 2013-03-15 NOTE — Telephone Encounter (Signed)
Called patient TO DISCUSS RESULTS AND SYMPTOMS. FEELING OKAY. TRYING TO PASS GAS. HAS A LITTLE PINCHING RARE.She had simple adenomas removed from her colon. PT HAD BEEN TAKING ALKA SELTZER FREQUENTLY AND ASA 81 MG QD. STOMACH AND SMALL BOWEL BIOPSIES SHOW NSAID INDUCED INJURY.  CALL [phone] FOR IF RECTAL BLEEDING OR RECTAL PAIN GETS WORSE OR FOR FEVER, OR PROBLEMS URINATING. AVOID ASA AND ASPIRIN PRODUCTS UNLESS MEDICALLY NECESSARY. MAY USE IBUPROFEN OTC 2 PO Q6H PRN FOR RECTAL PAIN AND TYLENOL AS NEEDED FOR PAIN. TAKE MEDS WITH FOOD. FOLLOW A LOW RESIDUE DIET UNTIL YOUR FOLLOW UP APPT. FOLLOW UP IN JUL 2 AT 2 PM.  NEXT TCS in 2017.

## 2013-03-16 ENCOUNTER — Ambulatory Visit: Admitting: Gastroenterology

## 2013-03-16 ENCOUNTER — Encounter (HOSPITAL_COMMUNITY): Payer: Self-pay | Admitting: Gastroenterology

## 2013-03-16 NOTE — Telephone Encounter (Signed)
Pt is aware of OV on 7/2 at 2 with SF and reminder in epic

## 2013-04-01 LAB — HEPATIC FUNCTION PANEL
ALT: 38 U/L — ABNORMAL HIGH (ref 0–35)
Alkaline Phosphatase: 44 U/L (ref 39–117)
Indirect Bilirubin: 0.7 mg/dL (ref 0.0–0.9)
Total Protein: 7.3 g/dL (ref 6.0–8.3)

## 2013-04-04 ENCOUNTER — Encounter: Payer: Self-pay | Admitting: Internal Medicine

## 2013-04-05 ENCOUNTER — Ambulatory Visit (INDEPENDENT_AMBULATORY_CARE_PROVIDER_SITE_OTHER): Admitting: Gastroenterology

## 2013-04-05 ENCOUNTER — Encounter: Payer: Self-pay | Admitting: Gastroenterology

## 2013-04-05 VITALS — BP 103/69 | HR 99 | Temp 97.4°F | Ht 67.0 in | Wt 204.8 lb

## 2013-04-05 DIAGNOSIS — K589 Irritable bowel syndrome without diarrhea: Secondary | ICD-10-CM

## 2013-04-05 DIAGNOSIS — K648 Other hemorrhoids: Secondary | ICD-10-CM | POA: Insufficient documentation

## 2013-04-05 DIAGNOSIS — K219 Gastro-esophageal reflux disease without esophagitis: Secondary | ICD-10-CM

## 2013-04-05 NOTE — Progress Notes (Signed)
Reminder in epic °

## 2013-04-05 NOTE — Progress Notes (Signed)
Cc PCP 

## 2013-04-05 NOTE — Assessment & Plan Note (Signed)
SX RESOLVED.  DRINK WATER EAT FIBER OPV IN NOV 2014

## 2013-04-05 NOTE — Patient Instructions (Signed)
CONTINUE YOUR WEIGHT LOSS EFFORTS. EAT SOMETHING EVERY 4 HOURS(< 200-400 CALORIES)  CONTINUE PREVACID. TAKE 30 MINUTES PRIOR TO BREAKFAST.  FOLLOW A HIGH FIBER/LOW FAT DIET. SEE INFO BELOW.  AVOID ASA AND ASPIRIN PRODUCTS UNLESS MEDICALLY NECESSARY.   MAY USE IBUPROFEN OTC 2 EVERY 6 HOURS AS NEEDED AND TYLENOL AS NEEDED FOR PAIN. TAKE MEDS WITH FOOD OR MILK.  FOLLOW UP IN 4 MOS.    Low-Fat Diet BREADS, CEREALS, PASTA, RICE, DRIED PEAS, AND BEANS These products are high in carbohydrates and most are low in fat. Therefore, they can be increased in the diet as substitutes for fatty foods. They too, however, contain calories and should not be eaten in excess. Cereals can be eaten for snacks as well as for breakfast.   FRUITS AND VEGETABLES It is good to eat fruits and vegetables. Besides being sources of fiber, both are rich in vitamins and some minerals. They help you get the daily allowances of these nutrients. Fruits and vegetables can be used for snacks and desserts.  MEATS Limit lean meat, chicken, [place], and fish to no more than 6 ounces per day. Beef, Pork, and Lamb Use lean cuts of beef, pork, and lamb. Lean cuts include:  Extra-lean ground beef.  Arm roast.  Sirloin tip.  Center-cut ham.  Round steak.  Loin chops.  Rump roast.  Tenderloin.  Trim all fat off the outside of meats before cooking. It is not necessary to severely decrease the intake of red meat, but lean choices should be made. Lean meat is rich in protein and contains a highly absorbable form of iron. Premenopausal women, in particular, should avoid reducing lean red meat because this could increase the risk for low red blood cells (iron-deficiency anemia).  Chicken and [place] These are good sources of protein. The fat of poultry can be reduced by removing the skin and underlying fat layers before cooking. Chicken and [place] can be substituted for lean red meat in the diet. Poultry should not be fried or covered  with high-fat sauces. Fish and Shellfish Fish is a good source of protein. Shellfish contain cholesterol, but they usually are low in saturated fatty acids. The preparation of fish is important. Like chicken and [place], they should not be fried or covered with high-fat sauces. EGGS Egg whites contain no fat or cholesterol. They can be eaten often. Try 1 to 2 egg whites instead of whole eggs in recipes or use egg substitutes that do not contain yolk. MILK AND DAIRY PRODUCTS Use skim or 1% milk instead of 2% or whole milk. Decrease whole milk, natural, and processed cheeses. Use nonfat or low-fat (2%) cottage cheese or low-fat cheeses made from vegetable oils. Choose nonfat or low-fat (1 to 2%) yogurt. Experiment with evaporated skim milk in recipes that call for heavy cream. Substitute low-fat yogurt or low-fat cottage cheese for sour cream in dips and salad dressings. Have at least 2 servings of low-fat dairy products, such as 2 glasses of skim (or 1%) milk each day to help get your daily calcium intake. FATS AND OILS Reduce the total intake of fats, especially saturated fat. Butterfat, lard, and beef fats are high in saturated fat and cholesterol. These should be avoided as much as possible. Vegetable fats do not contain cholesterol, but certain vegetable fats, such as coconut oil, palm oil, and palm kernel oil are very high in saturated fats. These should be limited. These fats are often used in [employer], processed foods, popcorn, oils, and nondairy  creamers. Vegetable shortenings and some peanut butters contain hydrogenated oils, which are also saturated fats. Read the labels on these foods and check for saturated vegetable oils. Unsaturated vegetable oils and fats do not raise blood cholesterol. However, they should be limited because they are fats and are high in calories. Total fat should still be limited to 30% of your daily caloric intake. Desirable liquid vegetable oils are corn oil, cottonseed  oil, olive oil, canola oil, safflower oil, soybean oil, and sunflower oil. Peanut oil is not as good, but small amounts are acceptable. Buy a heart-healthy tub margarine that has no partially hydrogenated oils in the ingredients. Mayonnaise and salad dressings often are made from unsaturated fats, but they should also be limited because of their high calorie and fat content. Seeds, nuts, peanut butter, olives, and avocados are high in fat, but the fat is mainly the unsaturated type. These foods should be limited mainly to avoid excess calories and fat. OTHER EATING TIPS Snacks  Most sweets should be limited as snacks. They tend to be rich in calories and fats, and their caloric content outweighs their nutritional value. Some good choices in snacks are graham crackers, melba toast, soda crackers, bagels (no egg), English muffins, fruits, and vegetables. These snacks are preferable to snack crackers, [place] fries, TORTILLA CHIPS, and POTATO chips. Popcorn should be air-popped or cooked in small amounts of liquid vegetable oil. Desserts Eat fruit, low-fat yogurt, and fruit ices instead of pastries, cake, and cookies. Sherbet, angel food cake, gelatin dessert, frozen low-fat yogurt, or other frozen products that do not contain saturated fat (pure fruit juice bars, frozen ice pops) are also acceptable.  COOKING METHODS Choose those methods that use little or no fat. They include: Poaching.  Braising.  Steaming.  Grilling.  Baking.  Stir-frying.  Broiling.  Microwaving.  Foods can be cooked in a nonstick pan without added fat, or use a nonfat cooking spray in regular cookware. Limit fried foods and avoid frying in saturated fat. Add moisture to lean meats by using water, broth, cooking wines, and other nonfat or low-fat sauces along with the cooking methods mentioned above. Soups and stews should be chilled after cooking. The fat that forms on top after a few hours in the refrigerator should be skimmed  off. When preparing meals, avoid using excess salt. Salt can contribute to raising blood pressure in some people.  EATING AWAY FROM HOME Order entres, potatoes, and vegetables without sauces or butter. When meat exceeds the size of a deck of cards (3 to 4 ounces), the rest can be taken home for another meal. Choose vegetable or fruit salads and ask for low-calorie salad dressings to be served on the side. Use dressings sparingly. Limit high-fat toppings, such as bacon, crumbled eggs, cheese, sunflower seeds, and olives. Ask for heart-healthy tub margarine instead of butter.  High-Fiber Diet A high-fiber diet changes your normal diet to include more whole grains, legumes, fruits, and vegetables. Changes in the diet involve replacing refined carbohydrates with unrefined foods. The calorie level of the diet is essentially unchanged. The Dietary Reference Intake (recommended amount) for adult males is 38 grams per day. For adult females, it is 25 grams per day. Pregnant and lactating women should consume 28 grams of fiber per day. Fiber is the intact part of a plant that is not broken down during digestion. Functional fiber is fiber that has been isolated from the plant to provide a beneficial effect in the body. PURPOSE  Increase stool  bulk.   Ease and regulate bowel movements.   Lower cholesterol.  INDICATIONS THAT YOU NEED MORE FIBER  Constipation and hemorrhoids.   Uncomplicated diverticulosis (intestine condition) and irritable bowel syndrome.   Weight management.   As a protective measure against hardening of the arteries (atherosclerosis), diabetes, and cancer.   GUIDELINES FOR INCREASING FIBER IN THE DIET  Start adding fiber to the diet slowly. A gradual increase of about 5 more grams (2 slices of whole-wheat bread, 2 servings of most fruits or vegetables, or 1 bowl of high-fiber cereal) per day is best. Too rapid an increase in fiber may result in constipation, flatulence, and  bloating.   Drink enough water and fluids to keep your urine clear or pale yellow. Water, juice, or caffeine-free drinks are recommended. Not drinking enough fluid may cause constipation.   Eat a variety of high-fiber foods rather than one type of fiber.   Try to increase your intake of fiber through using high-fiber foods rather than fiber pills or supplements that contain small amounts of fiber.   The goal is to change the types of food eaten. Do not supplement your present diet with high-fiber foods, but replace foods in your present diet.  INCLUDE A VARIETY OF FIBER SOURCES  Replace refined and processed grains with whole grains, canned fruits with fresh fruits, and incorporate other fiber sources. White rice, white breads, and most bakery goods contain little or no fiber.   Brown whole-grain rice, buckwheat oats, and many fruits and vegetables are all good sources of fiber. These include: broccoli, Brussels sprouts, cabbage, cauliflower, beets, sweet potatoes, white potatoes (skin on), carrots, tomatoes, eggplant, squash, berries, fresh fruits, and dried fruits.   Cereals appear to be the richest source of fiber. Cereal fiber is found in whole grains and bran. Bran is the fiber-rich outer coat of cereal grain, which is largely removed in refining. In whole-grain cereals, the bran remains. In breakfast cereals, the largest amount of fiber is found in those with "bran" in their names. The fiber content is sometimes indicated on the label.   You may need to include additional fruits and vegetables each day.   In baking, for 1 cup white flour, you may use the following substitutions:   1 cup whole-wheat flour minus 2 tablespoons.   1/2 cup white flour plus 1/2 cup whole-wheat flour.
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
## 2013-04-05 NOTE — Assessment & Plan Note (Signed)
SX IMPROVED-?DUE TO AVOIDING NSAIDS.   SUPPORTIVE CARE. DIET MODIFICATION. OPV IN NOV 2014

## 2013-04-05 NOTE — Assessment & Plan Note (Addendum)
SX CONTROLLED.  LOW FAT DIET PREVACID PRIOR TO BREAKFAST OPV IN NOV 2014

## 2013-04-17 ENCOUNTER — Telehealth: Payer: Self-pay | Admitting: Gastroenterology

## 2013-04-17 NOTE — Telephone Encounter (Signed)
PLEASE CALL PT. SHE MAY USE HER SUPPOSITORIES. WHEN HER SCHEDULE WILL ALLOW SHE MAY RETURN TO THE [facility] FOR [place] BANDING. SHE WILL NEED ANOSCOPY IN THE [facility] PRIOR TO PLACING ONE OR TWO MORE BANDS.

## 2013-04-17 NOTE — Telephone Encounter (Signed)
Pt had a hemorrhoid banding done in June and was seen for a follow up recently. Since then she has started bleeding again and has concerns weather or not she can take her suppositories to hold her over until she gets back in town from where she is having to take her husband for cancer treatments. She is leaving tomorrow and will not be back until the end of the month. She was also asking if it got worse should she go to [facility] and/or the [place] hospital. Nurse was not available to take call and I told patient that I would document all her concerns and have SF's nurse call her back. [phone] or [phone]

## 2013-04-17 NOTE — Telephone Encounter (Signed)
Pt informed that we don't have any instructions prior to the banding we do here in the office.

## 2013-04-17 NOTE — Telephone Encounter (Signed)
Called and informed pt. Transferred to Manuela Schwartz to schedule appt.

## 2013-04-17 NOTE — Telephone Encounter (Signed)
DS transferred call the me to schedule a hemorrhoid banding for patient. Pt is aware of OV (HEM) on 7/30 at 1130 with SF and asked if we could print out the instruction sheet to prep herself prior to banding. I told her we would print off a copy and I would have it in the mail this afternoon. Pt agreed.

## 2013-05-03 ENCOUNTER — Ambulatory Visit (INDEPENDENT_AMBULATORY_CARE_PROVIDER_SITE_OTHER): Admitting: Gastroenterology

## 2013-05-03 ENCOUNTER — Telehealth: Payer: Self-pay

## 2013-05-03 ENCOUNTER — Encounter: Payer: Self-pay | Admitting: Gastroenterology

## 2013-05-03 VITALS — BP 104/67 | HR 84 | Temp 97.4°F | Ht 67.0 in | Wt 204.2 lb

## 2013-05-03 DIAGNOSIS — K648 Other hemorrhoids: Secondary | ICD-10-CM

## 2013-05-03 NOTE — Telephone Encounter (Signed)
REVIEWED. AGREE. 

## 2013-05-03 NOTE — Telephone Encounter (Signed)
Pt called and wanted to know if she can use the NTG ointment that Dr. Oneida Alar gave her when she was in the hospital for the hemorrhoids. I spoke to Dr. Oneida Alar and she said she can use very sparingly if she is using the sublingual nitro. Per pt she has not had to use that in awhile. She is aware to use very sparingly if she has to use the sublingual nitro because both could drop her BP and she could pass out. Pt is aware per Dr. Oneida Alar said she can use the ointment tid otherwise.

## 2013-05-03 NOTE — Patient Instructions (Signed)
DRINK WATER TO KEEP YOUR URINE LIGHT YELLOW.  FOLLOW A HIGH FIBER DIET AS TOLERATED.   AVOID STRAINING AND CONSTIPATION.  FOLLOW UP IN AUG 13 AT 1130 AM.

## 2013-05-03 NOTE — Assessment & Plan Note (Addendum)
REPEAT [facility] BANDING: RIGHT ANTERIOR IN 2-3 WEEKS DIET AS TOLERATED

## 2013-05-03 NOTE — Progress Notes (Signed)
  Subjective:    Patient ID: [name], female    DOB: 1951-09-04, 62 y.o.   MRN: [id number]  [name], MD  HPI   SYMPTOMS: RECTAL BLEEDING, NO PRESSURE, PAIN, ITCHING, BURNING. THOUGH BULKING UP STOOL MADE HER BLEED MORE. NOW HAVING PROBLEMS WITH PLANTAR FASCIITIS. RUQ PAIN STILL HAPPENS: 1-2X/DAY FOR THE PAST 10 DAYS, BUT TAKES LEVSIN AND IT GOES AWAY  CONSTIPATION: NO DIARRHEA: YES  STRAINS WITH BMs: NO  TIME SPENT ON TOILET: 4 MINS TISSUE POKES OUT OF RECTUM: NO FIBER SUPPLEMENTS: NO & TAKES MIRALAX.   GLASSES OF WATER/DAY: 6-8: YES  ADDITIONAL QUESTIONS:  LATEX ALLERGY: YES PREGNANT: NO NITRATES: YES ANTICOAGULATION/ANTIPLATELET MEDS: NO DIAGNOSED WITH CROHN'S DISEASE, PROCTITIS, PORTAL HTN, OR ANAL/RECTAL CA: NO TAKING IMMUNOSUPPRESSANTS/XRT: NO    Review of Systems     Objective:   Physical Exam  Vitals reviewed. Constitutional: No distress.  Cardiovascular: Normal rate, regular rhythm and normal heart sounds.   Pulmonary/Chest: Effort normal and breath sounds normal. No respiratory distress.  Abdominal: Soft. Bowel sounds are normal. She exhibits no distension.    PROCEDURE TECHNIQUE: BENEFITS RISK EXPLAINED TO PT. ANOSCOPY PERFORMED. BULGING INTERNAL HEMORRHOID COLUMN IN THE R POSTERIOR AND ANTERIOR COLUMNS. ONE LATEX-FREE [facility] BAND PLACED IN RIGHT POSTERIOR POSITION. POST-BANDING RECTAL EXAM REVEALED GOOD PLACEMENT. EXAM NON-TENDER.      Assessment & Plan:

## 2013-05-05 ENCOUNTER — Telehealth: Payer: Self-pay

## 2013-05-05 MED ORDER — LINACLOTIDE 145 MCG PO CAPS: 145.0000 ug | ORAL_CAPSULE | Freq: Every day | ORAL | Status: DC

## 2013-05-05 NOTE — Telephone Encounter (Signed)
May trial Linzess 145 mcg each morning, 30 minutes before breakfast.  I sent in prescription to pharmacy.

## 2013-05-05 NOTE — Telephone Encounter (Signed)
Pt called and said she is having some bloating. She said she takes miralax in the evening and it wakes her up at 5:30 AM and she has a small BM but she is not having real good BM's. Said she is not eating a lot, but she is getting a little nausea because she cannot have a good BM. Please advise!

## 2013-05-05 NOTE — Addendum Note (Signed)
Addended by: Orvil Feil on: 05/05/2013 12:02 PM   Modules accepted: Orders

## 2013-05-05 NOTE — Telephone Encounter (Signed)
Called and informed pt.  Per AS she can hold the Miralax when she starts Linzess, and pt is aware.

## 2013-05-05 NOTE — Telephone Encounter (Signed)
Correction. Per patient request, I sent this to [facility].

## 2013-05-12 ENCOUNTER — Ambulatory Visit (INDEPENDENT_AMBULATORY_CARE_PROVIDER_SITE_OTHER): Admitting: Family Medicine

## 2013-05-12 VITALS — BP 110/78 | HR 99 | Temp 99.2°F | Resp 20 | Wt 201.5 lb

## 2013-05-12 DIAGNOSIS — J4530 Mild persistent asthma, uncomplicated: Secondary | ICD-10-CM

## 2013-05-12 DIAGNOSIS — D229 Melanocytic nevi, unspecified: Secondary | ICD-10-CM

## 2013-05-12 DIAGNOSIS — K760 Fatty (change of) liver, not elsewhere classified: Secondary | ICD-10-CM

## 2013-05-12 DIAGNOSIS — K589 Irritable bowel syndrome without diarrhea: Secondary | ICD-10-CM

## 2013-05-12 DIAGNOSIS — J45909 Unspecified asthma, uncomplicated: Secondary | ICD-10-CM

## 2013-05-12 DIAGNOSIS — K7689 Other specified diseases of liver: Secondary | ICD-10-CM

## 2013-05-12 DIAGNOSIS — I10 Essential (primary) hypertension: Secondary | ICD-10-CM

## 2013-05-12 DIAGNOSIS — R748 Abnormal levels of other serum enzymes: Secondary | ICD-10-CM

## 2013-05-12 DIAGNOSIS — D239 Other benign neoplasm of skin, unspecified: Secondary | ICD-10-CM

## 2013-05-12 DIAGNOSIS — N182 Chronic kidney disease, stage 2 (mild): Secondary | ICD-10-CM

## 2013-05-12 NOTE — Patient Instructions (Addendum)
We will call with results of labs Try the amitiza twice a day with meals Try the advair twice a day If the medications help we will send to the [place] Stop the flovent for now  I will send the BP meds separately to the [place] F/U 3 months

## 2013-05-13 LAB — COMPREHENSIVE METABOLIC PANEL
Albumin: 4.8 g/dL (ref 3.5–5.2)
CO2: 26 mEq/L (ref 19–32)
Glucose, Bld: 83 mg/dL (ref 70–99)
Sodium: 136 mEq/L (ref 135–145)
Total Bilirubin: 0.7 mg/dL (ref 0.3–1.2)
Total Protein: 7.2 g/dL (ref 6.0–8.3)

## 2013-05-13 LAB — CBC
MCV: 94.7 fL (ref 78.0–100.0)
Platelets: 212 10*3/uL (ref 150–400)
RBC: 3.97 MIL/uL (ref 3.87–5.11)
WBC: 5.6 10*3/uL (ref 4.0–10.5)

## 2013-05-14 ENCOUNTER — Encounter: Payer: Self-pay | Admitting: Family Medicine

## 2013-05-14 MED ORDER — IRBESARTAN 150 MG PO TABS: 150.0000 mg | ORAL_TABLET | Freq: Every day | ORAL | Status: DC

## 2013-05-14 MED ORDER — HYDROCHLOROTHIAZIDE 12.5 MG PO TABS: 12.5000 mg | ORAL_TABLET | Freq: Every day | ORAL | Status: DC

## 2013-05-14 NOTE — Assessment & Plan Note (Signed)
Recheck Cr, has some mild chronic insufficieny noted Has had renal work-up

## 2013-05-14 NOTE — Assessment & Plan Note (Signed)
Lesion in scalp appears to be pigmented mole, other differential is a keratosis, but no easily flaking or peeling of the lesions Send to dermatology at request

## 2013-05-14 NOTE — Assessment & Plan Note (Signed)
Well controlled, she will need Avalide spilt into separate meds due to coverage by [facility]

## 2013-05-14 NOTE — Assessment & Plan Note (Addendum)
Recheck LFT today, has chronic elevation, needs to continue limiting ETOH I will also consider trial off tricor and recheck

## 2013-05-14 NOTE — Progress Notes (Signed)
  Subjective:    Patient ID: [name], female    DOB: 1951/09/07, 62 y.o.   MRN: [id number]  HPI  Pt here to f/u chronic medical problems. Did not start SSRI after last visit, they are in therapy provided by [facility] for her husband. States she has decreased some of her wine use.  Concerned about a spot on top of her head she noticed 2 months ago, states it is growing and is now raised. Denies any bleeding from the scalp.  Asthma- continues to have difficulty with her asthma- using flovent but this is not helping, many irritants including cleaning solutions, smoke, environmental allergens set off her asthma. Needs a letter also stating her medical problems, to assist in her applications to [place] for assistance if her husband dies from his cancer. Diarrhea/IBS- started on Linzess by GI, states after taking a week has non stop diarrhea, would like to change dose of medication   Review of Systems  GEN- denies fatigue, fever, weight loss,weakness, recent illness HEENT- denies eye drainage, change in vision, nasal discharge, CVS- denies chest pain, palpitations RESP- denies SOB, cough, wheeze ABD- denies N/V,+ change in stools, abd pain GU- denies dysuria, hematuria, dribbling, incontinence MSK- denies joint pain, muscle aches, injury Neuro- denies headache, dizziness, syncope, seizure activity      Objective:   Physical Exam GEN- NAD, alert and oriented x3 HEENT- PERRL, EOMI, non injected sclera, pink conjunctiva, MMM, oropharynx clear Neck- Supple, no LAD CVS- RRR, no murmur RESP-CTAB ABD-NABS,soft,NT,ND EXT- No edema Pulses- Radial, DP- 2+ Psych- anxious appearing, not depressed appearing today Skin- Right frontal scalp dark black brown pigmented raised lesion        Assessment & Plan:

## 2013-05-14 NOTE — Assessment & Plan Note (Signed)
Uncontrolled, trial of Advair 100-50 given from office, if this helps will send in script Stop flovent

## 2013-05-14 NOTE — Assessment & Plan Note (Signed)
linzess causes diarrhea, given trial of amitiza 39m BID from the office, we will see how she does if tolerate [place]

## 2013-05-16 ENCOUNTER — Encounter: Payer: Self-pay | Admitting: Family Medicine

## 2013-05-16 ENCOUNTER — Encounter: Payer: Self-pay | Admitting: Gastroenterology

## 2013-05-16 ENCOUNTER — Telehealth: Payer: Self-pay | Admitting: Family Medicine

## 2013-05-16 NOTE — Addendum Note (Signed)
Addended by: Daylene Posey T on: 05/16/2013 04:42 PM   Modules accepted: Orders

## 2013-05-16 NOTE — Telephone Encounter (Signed)
meds printed and faxed to pt pharnacy

## 2013-05-17 ENCOUNTER — Ambulatory Visit (INDEPENDENT_AMBULATORY_CARE_PROVIDER_SITE_OTHER): Admitting: Gastroenterology

## 2013-05-17 ENCOUNTER — Encounter: Payer: Self-pay | Admitting: Gastroenterology

## 2013-05-17 VITALS — BP 94/59 | HR 97 | Temp 97.3°F | Ht 68.0 in | Wt 201.4 lb

## 2013-05-17 DIAGNOSIS — K589 Irritable bowel syndrome without diarrhea: Secondary | ICD-10-CM

## 2013-05-17 DIAGNOSIS — K648 Other hemorrhoids: Secondary | ICD-10-CM

## 2013-05-17 MED ORDER — LINACLOTIDE 145 MCG PO CAPS: 145.0000 ug | ORAL_CAPSULE | Freq: Every day | ORAL | Status: DC

## 2013-05-17 NOTE — Assessment & Plan Note (Signed)
SX IMPROVED. STILL AHS PRESSURE FROM R POS BANDING.  DECLINED [place] BANDING AT THIS TIME. [place] BANDING IN SEP 2014 OPV IN 6 MOS

## 2013-05-17 NOTE — Assessment & Plan Note (Signed)
MIXED SX  DRINK WATER EAT FIBER LINZESS TO TREAT CONSTIPATION. OPV IN 6 MOS
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
## 2013-05-17 NOTE — Patient Instructions (Addendum)
DRINK WATER TO KEEP YOUR URINE LIGHT YELLOW.  FOLLOW A HIGH FIBER DIET. SEE INFO BELOW.  CONTINUE LINZESS TO TREAT CONSTIPATION.  SEE YOU IN Jun 14, 2013 FOR [facility] BANDING. I WILL MAKE AN APPT FOR YOU IN 6 MOS

## 2013-05-17 NOTE — Progress Notes (Signed)
Patient ID: [name], female   DOB: Jan 22, 1951, 62 y.o.   MRN: [id number]   FELT LIKE SOMEBODY PULLING ON LEG AND UP THROUGH RIGHT LEG. REALLY FOR 4 DAYS AND MILD SX NOW. WOULD LIEK TO WAIT FOR R ANTERIOR BAND. AFTER LAST BANDING FEELING LOTS OF ABD PRESSURE/UPPER/LOWER PAIN. TRIED AMITIZA AND IT DIDN'T WORK. FELT BETTER AFTER LINZESS. LINZESS GIVES HER A TOTAL FLUSH FOR 2 HRS.

## 2013-05-17 NOTE — Progress Notes (Signed)
CC'd to PCP 

## 2013-05-17 NOTE — Progress Notes (Signed)
Reminder in epic °

## 2013-05-19 ENCOUNTER — Ambulatory Visit: Admitting: Gastroenterology

## 2013-05-23 ENCOUNTER — Telehealth: Payer: Self-pay | Admitting: Family Medicine

## 2013-05-23 MED ORDER — CIPROFLOXACIN HCL 500 MG PO TABS: 500.0000 mg | ORAL_TABLET | Freq: Two times a day (BID) | ORAL | Status: DC

## 2013-05-23 NOTE — Telephone Encounter (Signed)
Okay to call in Cipro 526m BID for 5 days

## 2013-05-23 NOTE — Telephone Encounter (Signed)
RX called to pharmacy of choice and pt is aware.

## 2013-05-23 NOTE — Telephone Encounter (Signed)
In [place] with husband for cancer treatments.  C/O sticky discharge from urethra and burning with urination.  Trying to push fluids.  Asking please call in something for her.  Remember she has Sickle Cell trait.  Can we call it into [facility] [phone] in [place]?

## 2013-06-13 ENCOUNTER — Telehealth: Payer: Self-pay | Admitting: Family Medicine

## 2013-06-13 DIAGNOSIS — N390 Urinary tract infection, site not specified: Secondary | ICD-10-CM

## 2013-06-13 DIAGNOSIS — R7989 Other specified abnormal findings of blood chemistry: Secondary | ICD-10-CM

## 2013-06-13 NOTE — Addendum Note (Signed)
Addended by: [name] F on: 06/13/2013 01:42 PM   Modules accepted: Orders

## 2013-06-13 NOTE — Telephone Encounter (Signed)
Orders placed,

## 2013-06-13 NOTE — Telephone Encounter (Signed)
Pt aware of message

## 2013-06-13 NOTE — Telephone Encounter (Signed)
Want to know if she can have UA and liver function test done d/t to taking her off tricor and if she can do it over at [facility] over at [place]?

## 2013-06-14 ENCOUNTER — Encounter: Admitting: Gastroenterology

## 2013-06-21 ENCOUNTER — Other Ambulatory Visit: Payer: Self-pay | Admitting: Family Medicine

## 2013-06-21 LAB — HEPATIC FUNCTION PANEL
Albumin: 4.6 g/dL (ref 3.5–5.2)
Total Bilirubin: 1.2 mg/dL (ref 0.3–1.2)

## 2013-06-21 LAB — COMPREHENSIVE METABOLIC PANEL
ALT: 62 U/L — ABNORMAL HIGH (ref 0–35)
CO2: 27 mEq/L (ref 19–32)
Calcium: 9.9 mg/dL (ref 8.4–10.5)
Chloride: 101 mEq/L (ref 96–112)
Sodium: 140 mEq/L (ref 135–145)
Total Protein: 7.2 g/dL (ref 6.0–8.3)

## 2013-06-22 LAB — URINALYSIS
Leukocytes, UA: NEGATIVE
Nitrite: NEGATIVE
Specific Gravity, Urine: 1.011 (ref 1.005–1.030)
pH: 5.5 (ref 5.0–8.0)

## 2013-07-24 ENCOUNTER — Telehealth: Payer: Self-pay | Admitting: *Deleted

## 2013-07-24 NOTE — Telephone Encounter (Signed)
Called and left all of the info on VM, including the OV appt date and time.

## 2013-07-24 NOTE — Telephone Encounter (Signed)
Pt called stating she is having bad abd. Pain and pain on her right side, pt states she needs a appt. This week because her husband is starting back on chemo next week and he drives her, told pt next date we could see her is nov. 4th with leslie lewis, pt states she cant wait that long because she don't want her husband coming out after he has his chemo. Please advise [phone] pt said if can't reach at that number call her cell phone [phone]

## 2013-07-24 NOTE — Telephone Encounter (Signed)
I called pt. She said she has had an incredible amount of abdominal pain in the last couple of weeks. She thinks some of it is coming from the [place] and she did not take it this am. She hurts more on her right side. ( does not have GB). She would like to be seen this week if possible due to her husband starting back on chemo next week. Please advise!

## 2013-07-24 NOTE — Telephone Encounter (Signed)
PLEASE CALL PT. OFFER HER an urgent spot if available. SHE SHOULD STOP TAKING LINZESS. IF SHE FEELS SHE CAN'T WAIT FOR AN APPT AND SHE STILL FEELS SHE NEEDS AN EVALUATION, SHE SHOULD GO TO THE ED.

## 2013-07-24 NOTE — Telephone Encounter (Signed)
Pt has appt. Tomorrow with anna sams at 11:30 urgent spot.

## 2013-07-25 ENCOUNTER — Ambulatory Visit (HOSPITAL_COMMUNITY)
Admission: RE | Admit: 2013-07-25 | Discharge: 2013-07-25 | Disposition: A | Discharge status: Home / Self Care | Source: Ambulatory Visit | Attending: Gastroenterology | Admitting: Gastroenterology

## 2013-07-25 ENCOUNTER — Ambulatory Visit (INDEPENDENT_AMBULATORY_CARE_PROVIDER_SITE_OTHER): Admitting: Gastroenterology

## 2013-07-25 ENCOUNTER — Encounter: Payer: Self-pay | Admitting: Gastroenterology

## 2013-07-25 ENCOUNTER — Encounter (INDEPENDENT_AMBULATORY_CARE_PROVIDER_SITE_OTHER): Payer: Self-pay

## 2013-07-25 VITALS — BP 128/75 | HR 88 | Temp 97.0°F | Wt 196.6 lb

## 2013-07-25 DIAGNOSIS — K7689 Other specified diseases of liver: Secondary | ICD-10-CM | POA: Insufficient documentation

## 2013-07-25 DIAGNOSIS — N200 Calculus of kidney: Secondary | ICD-10-CM | POA: Insufficient documentation

## 2013-07-25 DIAGNOSIS — R109 Unspecified abdominal pain: Secondary | ICD-10-CM

## 2013-07-25 DIAGNOSIS — R1011 Right upper quadrant pain: Secondary | ICD-10-CM

## 2013-07-25 DIAGNOSIS — Q619 Cystic kidney disease, unspecified: Secondary | ICD-10-CM | POA: Insufficient documentation

## 2013-07-25 LAB — BASIC METABOLIC PANEL
CO2: 26 mEq/L (ref 19–32)
Glucose, Bld: 107 mg/dL — ABNORMAL HIGH (ref 70–99)
Potassium: 3.7 mEq/L (ref 3.5–5.3)
Sodium: 137 mEq/L (ref 135–145)

## 2013-07-25 LAB — HEPATIC FUNCTION PANEL
ALT: 105 U/L — ABNORMAL HIGH (ref 0–35)
Bilirubin, Direct: 0.2 mg/dL (ref 0.0–0.3)
Total Bilirubin: 0.8 mg/dL (ref 0.3–1.2)

## 2013-07-25 LAB — AMYLASE: Amylase: 131 U/L — ABNORMAL HIGH (ref 0–105)

## 2013-07-25 MED ORDER — IOHEXOL 300 MG/ML  SOLN
100.0000 mL | Freq: Once | INTRAMUSCULAR | Status: AC | PRN
  Administered 2013-07-25: 100 mL via INTRAVENOUS
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
## 2013-07-25 NOTE — Patient Instructions (Signed)
Please complete blood work and CT scan today. We will call with results.  We may need to refer you to another facility for work-up; this will be decided after the CT scan and blood work is reviewed.

## 2013-07-25 NOTE — Progress Notes (Signed)
Quick Note:  Patient informed regarding CT results.  Needs CT chest in 3 months due to an irregular 71m density in posterior left lower lung lobe.  Liver cyst stable.  Enlarged cyst on right kidney; I recommend proceeding with an MRI.   Please schedule MRI this week if at all possible; pt's husband begins chemo next week again. ______

## 2013-07-26 ENCOUNTER — Ambulatory Visit: Admitting: Gastroenterology

## 2013-07-26 ENCOUNTER — Other Ambulatory Visit: Payer: Self-pay | Admitting: Gastroenterology

## 2013-07-26 DIAGNOSIS — N281 Cyst of kidney, acquired: Secondary | ICD-10-CM

## 2013-07-27 ENCOUNTER — Encounter (HOSPITAL_COMMUNITY): Payer: Self-pay

## 2013-07-27 ENCOUNTER — Ambulatory Visit (HOSPITAL_COMMUNITY)
Admission: RE | Admit: 2013-07-27 | Discharge: 2013-07-27 | Disposition: A | Discharge status: Home / Self Care | Source: Ambulatory Visit | Attending: Gastroenterology | Admitting: Gastroenterology

## 2013-07-27 DIAGNOSIS — N281 Cyst of kidney, acquired: Secondary | ICD-10-CM

## 2013-07-27 DIAGNOSIS — K7689 Other specified diseases of liver: Secondary | ICD-10-CM | POA: Insufficient documentation

## 2013-07-27 DIAGNOSIS — Q619 Cystic kidney disease, unspecified: Secondary | ICD-10-CM | POA: Insufficient documentation

## 2013-07-27 DIAGNOSIS — R1011 Right upper quadrant pain: Secondary | ICD-10-CM | POA: Insufficient documentation

## 2013-07-27 DIAGNOSIS — R1031 Right lower quadrant pain: Secondary | ICD-10-CM | POA: Insufficient documentation

## 2013-07-27 MED ORDER — GADOBENATE DIMEGLUMINE 529 MG/ML IV SOLN
18.0000 mL | Freq: Once | INTRAVENOUS | Status: AC | PRN
  Administered 2013-07-27: 18 mL via INTRAVENOUS

## 2013-07-27 NOTE — Progress Notes (Signed)
Quick Note:  Patient has a fatty liver and likely benign renal cyst.  Further recommendations to follow after I talk with Dr. Oneida Alar. ______

## 2013-07-27 NOTE — Progress Notes (Signed)
cc'd to pcp 

## 2013-07-27 NOTE — Assessment & Plan Note (Signed)
Persistent, worsening RUQ pain with interesting presentation; multiple aggravating and relieving factors. Laying on her left side worsens pain, heating pad will sometimes relieve. Sometimes associated with eating, sometimes improved with eating but then worsened later. Occasional relief for a few minutes after bowel movement. Strong anxiety overlay. She has been evaluated for this in the past extensively as noted in HPI. However, she does have mild elevation of transaminases persistently and her last CT was 2 years ago. I question musculoskeletal, chronic abdominal pain, IBS, less likely Sphincter of Oddi.   Due to persistent symptoms: proceed with CT abd/pelvis stat today. Recheck HFP, lipase, amylase Further recommendations to follow

## 2013-07-27 NOTE — Progress Notes (Signed)
Quick Note:  Called and informed pt. ______

## 2013-08-08 ENCOUNTER — Other Ambulatory Visit: Payer: Self-pay | Admitting: Family Medicine

## 2013-08-08 ENCOUNTER — Telehealth: Payer: Self-pay | Admitting: Family Medicine

## 2013-08-08 MED ORDER — BRIMONIDINE TARTRATE 0.2 % OP SOLN: 1.0000 [drp] | Freq: Two times a day (BID) | OPHTHALMIC | Status: DC

## 2013-08-08 MED ORDER — LATANOPROST 0.005 % OP SOLN: 1.0000 [drp] | Freq: Every day | OPHTHALMIC | Status: DC

## 2013-08-08 NOTE — Telephone Encounter (Signed)
Medication refilled per protocol. 

## 2013-08-08 NOTE — Telephone Encounter (Signed)
Patient needs her refills on her eye drops and the Bromadrine.   [name]

## 2013-08-09 NOTE — Progress Notes (Signed)
REVIEWED CT/MRI/OPV. CHRONIC ABD PAIN  MOST LIKELY DUE TO IBS. PT UNDER SIGNIFICANT STRESS. OFFER REFERRAL TO [facility], [facility], OR [facility] FOR SECOND OPINION. NO ADDITIONAL WORKUP AVAILABLE AT [facility]. AMYLASE NOT CLINICALLY SIGNIFICANT. ELEVATED LIVER ENZYMES MOST LIKELY DUE TO NASH. PT'S WEIGHT UP FROM 2009: 179 LBS TO 196 LBS IN 2014. WOULD CHECK SEROLOGIES FOR AUTOIMMUNE HEPATITIS.

## 2013-08-14 ENCOUNTER — Encounter: Payer: Self-pay | Admitting: Family Medicine

## 2013-08-14 ENCOUNTER — Other Ambulatory Visit: Payer: Self-pay | Admitting: Family Medicine

## 2013-08-14 ENCOUNTER — Other Ambulatory Visit: Payer: Self-pay | Admitting: Gastroenterology

## 2013-08-14 ENCOUNTER — Ambulatory Visit (INDEPENDENT_AMBULATORY_CARE_PROVIDER_SITE_OTHER): Admitting: Family Medicine

## 2013-08-14 VITALS — BP 118/80 | HR 98 | Temp 98.2°F | Resp 20 | Ht 67.0 in | Wt 197.0 lb

## 2013-08-14 DIAGNOSIS — K760 Fatty (change of) liver, not elsewhere classified: Secondary | ICD-10-CM

## 2013-08-14 DIAGNOSIS — E669 Obesity, unspecified: Secondary | ICD-10-CM

## 2013-08-14 DIAGNOSIS — M6789 Other specified disorders of synovium and tendon, multiple sites: Secondary | ICD-10-CM

## 2013-08-14 DIAGNOSIS — K7689 Other specified diseases of liver: Secondary | ICD-10-CM

## 2013-08-14 DIAGNOSIS — R7989 Other specified abnormal findings of blood chemistry: Secondary | ICD-10-CM

## 2013-08-14 DIAGNOSIS — R911 Solitary pulmonary nodule: Secondary | ICD-10-CM

## 2013-08-14 DIAGNOSIS — M6790 Unspecified disorder of synovium and tendon, unspecified site: Secondary | ICD-10-CM | POA: Insufficient documentation

## 2013-08-14 DIAGNOSIS — J45909 Unspecified asthma, uncomplicated: Secondary | ICD-10-CM

## 2013-08-14 NOTE — Assessment & Plan Note (Signed)
Doing well on advair and albuterol prn

## 2013-08-14 NOTE — Assessment & Plan Note (Signed)
Plan to check FLP  Possible underlying autoimmune hepatitis, discussed with GI, orders placed by their office

## 2013-08-14 NOTE — Assessment & Plan Note (Signed)
Benign nodule vs ganglion cyst on tendon, no work up needed

## 2013-08-14 NOTE — Assessment & Plan Note (Signed)
Incidental finding, repeat scan in 6 months

## 2013-08-14 NOTE — Assessment & Plan Note (Signed)
Intentional 5lb weight loss

## 2013-08-14 NOTE — Progress Notes (Signed)
  Subjective:    Patient ID: [name], female    DOB: 09-17-1951, 62 y.o.   MRN: [id number]  HPI  Pt here to f/u chronic medical problems. Seen by GI due to continued RUQ abdominal pain. CT of abd/pelvis showed NASH, as well as benign cyst on right kidney. There was some concern for possible auto-immune hepatitis. She has stopped all bowel regimens felt they were making her worse Small irregular density also found on left lower lung, benign appearing but recommendation was for repeat CT scan in 6 months HTN- tolerating medications  Noticed a small knot on her left forearm after banging her arm Medications reviewed      Review of Systems   GEN- denies fatigue, fever, weight loss,weakness, recent illness HEENT- denies eye drainage, change in vision, nasal discharge, CVS- denies chest pain, palpitations RESP- denies SOB, cough, wheeze ABD- denies N/V, change in stools, abd pain GU- denies dysuria, hematuria, dribbling, incontinence MSK- denies joint pain, muscle aches, injury Neuro- denies headache, dizziness, syncope, seizure activity      Objective:   Physical Exam  GEN- NAD, alert and oriented x3 HEENT- PERRL, EOMI, non injected sclera, pink conjunctiva, MMM, oropharynx clear Neck- Supple, no thryomegaly CVS- RRR, no murmur RESP-CTAB ABD-NABS,soft,NT,ND EXT- No edema Pulses- Radial, DP- 2+ Psych- normal affect and mood Skin- small nodule felt along left  forearm tendon       Assessment & Plan:

## 2013-08-14 NOTE — Patient Instructions (Signed)
Continue current meds Refills to be done We will call with lab results and f/u for GI  Continue to work on weight loss F/U 3 months

## 2013-08-15 ENCOUNTER — Other Ambulatory Visit: Payer: Self-pay | Admitting: *Deleted

## 2013-08-15 DIAGNOSIS — I10 Essential (primary) hypertension: Secondary | ICD-10-CM

## 2013-08-15 LAB — COMPREHENSIVE METABOLIC PANEL WITH GFR
ALT: 48 U/L — ABNORMAL HIGH (ref 0–35)
AST: 35 U/L (ref 0–37)
Albumin: 4.3 g/dL (ref 3.5–5.2)
Alkaline Phosphatase: 65 U/L (ref 39–117)
BUN: 12 mg/dL (ref 6–23)
CO2: 22 meq/L (ref 19–32)
Calcium: 9.6 mg/dL (ref 8.4–10.5)
Chloride: 103 meq/L (ref 96–112)
Creat: 0.8 mg/dL (ref 0.50–1.10)
Glucose, Bld: 92 mg/dL (ref 70–99)
Potassium: 4.3 meq/L (ref 3.5–5.3)
Sodium: 135 meq/L (ref 135–145)
Total Bilirubin: 0.8 mg/dL (ref 0.3–1.2)
Total Protein: 6.7 g/dL (ref 6.0–8.3)

## 2013-08-15 LAB — LIPID PANEL
HDL: 33 mg/dL — ABNORMAL LOW (ref >39)
Total CHOL/HDL Ratio: 5.1 Ratio
Triglycerides: 464 mg/dL — ABNORMAL HIGH (ref <150)

## 2013-08-15 LAB — IGG, IGA, IGM
IgA: 186 mg/dL (ref 69–380)
IgM, Serum: 121 mg/dL (ref 52–322)

## 2013-08-15 LAB — MITOCHONDRIAL ANTIBODIES: Mitochondrial M2 Ab, IgG: 0.16 (ref <0.91)

## 2013-08-15 MED ORDER — ALPRAZOLAM 1 MG PO TABS: 1.0000 mg | ORAL_TABLET | Freq: Two times a day (BID) | ORAL | Status: DC | PRN

## 2013-08-15 MED ORDER — ZOLPIDEM TARTRATE 10 MG PO TABS: 10.0000 mg | ORAL_TABLET | Freq: Every day | ORAL | Status: DC

## 2013-08-15 MED ORDER — IRBESARTAN 150 MG PO TABS: 150.0000 mg | ORAL_TABLET | Freq: Every day | ORAL | Status: DC

## 2013-08-15 MED ORDER — HYDROCHLOROTHIAZIDE 12.5 MG PO TABS: 12.5000 mg | ORAL_TABLET | Freq: Every day | ORAL | Status: DC

## 2013-08-15 NOTE — Telephone Encounter (Signed)
Med printed, dr signed and faxed to [facility]

## 2013-08-15 NOTE — Progress Notes (Signed)
Labs have been ordered and pending.  Doris, please ask patient if she would like to seek a second opinion regarding chronic RUQ pain.

## 2013-08-16 NOTE — Progress Notes (Signed)
LMOM for a return call.  

## 2013-08-16 NOTE — Progress Notes (Signed)
Please arrange referral to [facility] due to chronic RUQ pain.

## 2013-08-16 NOTE — Progress Notes (Signed)
REVIEWED. Proceed with [facility] referral;.

## 2013-08-16 NOTE — Progress Notes (Signed)
Patient is aware 

## 2013-08-16 NOTE — Progress Notes (Signed)
Called and informed pt. She would like the referral to [facility] ( Also, she said to let Dr. Oneida Alar know that she had some labs done for Dr. Moshe Cipro on Monday this week and she could take a look at those).

## 2013-08-16 NOTE — Progress Notes (Signed)
Patient is scheduled with Dr. Rita Ohara at [facility] [place] in [facility] Thursday April 9th at 8:00 am [place] Fax# [phone] Address [address]  [place] , [place] [address]

## 2013-08-17 ENCOUNTER — Other Ambulatory Visit: Payer: Self-pay | Admitting: Gastroenterology

## 2013-08-17 ENCOUNTER — Other Ambulatory Visit: Payer: Self-pay | Admitting: Family Medicine

## 2013-08-17 LAB — COMPREHENSIVE METABOLIC PANEL
AST: 45 U/L — ABNORMAL HIGH (ref 0–37)
Albumin: 4.7 g/dL (ref 3.5–5.2)
BUN: 14 mg/dL (ref 6–23)
Calcium: 9.9 mg/dL (ref 8.4–10.5)
Chloride: 104 mEq/L (ref 96–112)
Creat: 0.77 mg/dL (ref 0.50–1.10)
Glucose, Bld: 87 mg/dL (ref 70–99)
Potassium: 3.8 mEq/L (ref 3.5–5.3)
Sodium: 139 mEq/L (ref 135–145)

## 2013-08-17 LAB — MITOCHONDRIAL/SMOOTH MUSCLE AB PNL: Smooth Muscle Ab: 9 U (ref <20)

## 2013-08-18 LAB — LIPID PANEL
LDL Cholesterol: 68 mg/dL (ref 0–99)
Total CHOL/HDL Ratio: 4.7 Ratio
VLDL: 64 mg/dL — ABNORMAL HIGH (ref 0–40)

## 2013-08-18 LAB — IGG, IGA, IGM
IgA: 184 mg/dL (ref 69–380)
IgG (Immunoglobin G), Serum: 802 mg/dL (ref 690–1700)
IgM, Serum: 120 mg/dL (ref 52–322)

## 2013-08-21 NOTE — Progress Notes (Signed)
Quick Note:  See separate result note. Pt aware of normal results. ______

## 2013-08-21 NOTE — Progress Notes (Signed)
Pt aware of lab results 

## 2013-08-21 NOTE — Progress Notes (Signed)
AMA, ANA, ASMA, immunoglobulins all came back normal.

## 2013-09-05 ENCOUNTER — Ambulatory Visit (INDEPENDENT_AMBULATORY_CARE_PROVIDER_SITE_OTHER): Admitting: Family Medicine

## 2013-09-05 ENCOUNTER — Telehealth: Payer: Self-pay | Admitting: Family Medicine

## 2013-09-05 ENCOUNTER — Encounter: Payer: Self-pay | Admitting: Family Medicine

## 2013-09-05 VITALS — BP 110/84 | HR 82 | Temp 98.5°F | Resp 20 | Ht 67.0 in | Wt 198.0 lb

## 2013-09-05 DIAGNOSIS — M25569 Pain in unspecified knee: Secondary | ICD-10-CM | POA: Insufficient documentation

## 2013-09-05 DIAGNOSIS — M6789 Other specified disorders of synovium and tendon, multiple sites: Secondary | ICD-10-CM

## 2013-09-05 DIAGNOSIS — M25579 Pain in unspecified ankle and joints of unspecified foot: Secondary | ICD-10-CM

## 2013-09-05 DIAGNOSIS — M25572 Pain in left ankle and joints of left foot: Secondary | ICD-10-CM

## 2013-09-05 DIAGNOSIS — M6790 Unspecified disorder of synovium and tendon, unspecified site: Secondary | ICD-10-CM

## 2013-09-05 DIAGNOSIS — M25559 Pain in unspecified hip: Secondary | ICD-10-CM

## 2013-09-05 NOTE — Assessment & Plan Note (Signed)
The nausea that was assessed 4 weeks ago has grown in size. I will refer her to general surgery to have this removed and pathology can be sent.

## 2013-09-05 NOTE — Telephone Encounter (Signed)
Dr Daivd Council (surgeon) [phone] Needing to know what he is suppose to be looking at with lump on arm tomorrow  pt had already called and made apt for tomorrow at 2  Call back number [phone]

## 2013-09-05 NOTE — Assessment & Plan Note (Signed)
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 I will obtain images of the knee and the hips/ankles. She takes an occasional ibuprofen if needed. We will hold off on any referrals unless she has severe arthritis in with something can be done.

## 2013-09-05 NOTE — Progress Notes (Signed)
   Subjective:    Patient ID: [name], female    DOB: 01/01/1951, 62 y.o.   MRN: [id number]  HPI Patient here with concern for large and not on her forearm. This was evaluated her last visit thought to be a nodule at the tendon sheath. She states now it is very tender it has become larger. She also feels a tiny spot on her left hip.  She's had increased joint pains over the past couple months. She has pains in her hips left greater than right she did sustain an injury greater than 20 years ago and recently fell while outside in her garden. She rolls her left ankle a lot and often has pain with this she also has pain in her hands. She did have x-rays done in 2011 and 2012 which showed osteoarthritis in her hands. She was evaluated by rheumatology in the past and had workup for rheumatoid as well as lupus and was told everything was negative. She continues to suffer with joint pains on and off which she feels are worsened.   Review of Systems  GEN- denies fatigue, fever, weight loss,weakness, recent illness CVS- denies chest pain, palpitations RESP- denies SOB, cough, wheeze MSK- + joint pain, muscle aches, injury Neuro- denies headache, dizziness, syncope, seizure activity      Objective:   Physical Exam GEN- NAD, alert and oriented x3 HEENT- PERRL, EOMI, non injected sclera, pink conjunctiva, MMM, oropharynx clear CVS- RRR, no murmur RESP-CTAB MSK- Back normal Inspection, Spine NT, Decreased ROM Left HIP, pain with IR, bilat knees normal inspection, fair ROM bilat, no crepitus, ligaments in tact, Bilat ankles  Normal inspection, good ROM, ligaments in tact, mild TTP medial malleous bilat, Bilat hands some curvature and swelling at MIP bilat index fingers EXT- No edema Pulses- Radial, DP- 2+ Skin- grape size nodule felt along left  forearm tendon, TTP, Left hip, no discrete nodule felt         Assessment & Plan:

## 2013-09-05 NOTE — Assessment & Plan Note (Signed)
Per above concern about osteoarthritis of the left hip and she does has decreased range of motion on the side and this is sided typically gives her more pain. X-ray to be done

## 2013-09-05 NOTE — Patient Instructions (Addendum)
Xray of Hip and Knees to be done Referral to Dr. Romona Curls  F/U as previous

## 2013-09-06 ENCOUNTER — Ambulatory Visit (HOSPITAL_COMMUNITY)
Admission: RE | Admit: 2013-09-06 | Discharge: 2013-09-06 | Disposition: A | Discharge status: Home / Self Care | Source: Ambulatory Visit | Attending: Family Medicine | Admitting: Family Medicine

## 2013-09-06 DIAGNOSIS — M25569 Pain in unspecified knee: Secondary | ICD-10-CM | POA: Insufficient documentation

## 2013-09-06 DIAGNOSIS — M25572 Pain in left ankle and joints of left foot: Secondary | ICD-10-CM

## 2013-09-06 DIAGNOSIS — M7730 Calcaneal spur, unspecified foot: Secondary | ICD-10-CM | POA: Insufficient documentation

## 2013-09-06 DIAGNOSIS — M25559 Pain in unspecified hip: Secondary | ICD-10-CM | POA: Insufficient documentation

## 2013-09-06 DIAGNOSIS — M25579 Pain in unspecified ankle and joints of unspecified foot: Secondary | ICD-10-CM | POA: Insufficient documentation

## 2013-09-06 NOTE — Telephone Encounter (Signed)
Pt had appt today with Dr. Hassan Buckler

## 2013-09-06 NOTE — Telephone Encounter (Signed)
Tried calling Dr Romona Curls today line busy

## 2013-09-12 NOTE — Telephone Encounter (Signed)
Pt called stating back in April she accepted a appointment at [facility], then [facility] called to offer her a appointment and she couldn't take that because [facility] told her the referral has been closed, pt would like to go to [facility] because it is closer and would be easier for her to go since her husband has cancer. Pt states [facility] is in [place] and would be more harder for her to go. Pt still has a appointment at [facility] as of right now. Please advise (home) [phone] (cell) [phone]

## 2013-09-12 NOTE — Telephone Encounter (Signed)
New referral has been faxed to [facility]

## 2013-10-13 ENCOUNTER — Telehealth: Payer: Self-pay | Admitting: Family Medicine

## 2013-10-13 MED ORDER — OSELTAMIVIR PHOSPHATE 75 MG PO CAPS: 75.0000 mg | ORAL_CAPSULE | Freq: Two times a day (BID) | ORAL | Status: DC

## 2013-10-13 NOTE — Telephone Encounter (Signed)
Husband undergoing cancer treatment.  She can not drive.  Feels she is getting FLU.  Achy, HA, lethargic, nausea.  Can you call her in the Tamiflu?

## 2013-10-13 NOTE — Telephone Encounter (Signed)
Okay to send Tamiflu 42m BID x 5 days

## 2013-10-13 NOTE — Telephone Encounter (Signed)
Pt aware, RX to pharmacy

## 2013-11-02 ENCOUNTER — Other Ambulatory Visit: Payer: Self-pay | Admitting: Family Medicine

## 2013-11-02 ENCOUNTER — Telehealth: Payer: Self-pay | Admitting: Gastroenterology

## 2013-11-02 DIAGNOSIS — Z139 Encounter for screening, unspecified: Secondary | ICD-10-CM

## 2013-11-02 NOTE — Telephone Encounter (Signed)
[name] is scheduled with Dr. Linward Foster at [facility] on 01/10/2014

## 2013-11-06 ENCOUNTER — Ambulatory Visit (HOSPITAL_COMMUNITY)

## 2013-11-13 ENCOUNTER — Ambulatory Visit (HOSPITAL_COMMUNITY)
Admission: RE | Admit: 2013-11-13 | Discharge: 2013-11-13 | Disposition: A | Discharge status: Home / Self Care | Source: Ambulatory Visit | Attending: Family Medicine | Admitting: Family Medicine

## 2013-11-13 DIAGNOSIS — Z139 Encounter for screening, unspecified: Secondary | ICD-10-CM

## 2013-11-13 DIAGNOSIS — Z1231 Encounter for screening mammogram for malignant neoplasm of breast: Secondary | ICD-10-CM | POA: Insufficient documentation

## 2013-11-15 ENCOUNTER — Ambulatory Visit (INDEPENDENT_AMBULATORY_CARE_PROVIDER_SITE_OTHER): Admitting: Family Medicine

## 2013-11-15 ENCOUNTER — Encounter: Payer: Self-pay | Admitting: Family Medicine

## 2013-11-15 VITALS — BP 110/68 | HR 98 | Temp 98.7°F | Resp 18 | Ht 65.0 in | Wt 200.0 lb

## 2013-11-15 DIAGNOSIS — R911 Solitary pulmonary nodule: Secondary | ICD-10-CM

## 2013-11-15 DIAGNOSIS — E785 Hyperlipidemia, unspecified: Secondary | ICD-10-CM

## 2013-11-15 DIAGNOSIS — H9209 Otalgia, unspecified ear: Secondary | ICD-10-CM

## 2013-11-15 DIAGNOSIS — H9190 Unspecified hearing loss, unspecified ear: Secondary | ICD-10-CM

## 2013-11-15 DIAGNOSIS — IMO0002 Reserved for concepts with insufficient information to code with codable children: Secondary | ICD-10-CM

## 2013-11-15 DIAGNOSIS — S29011A Strain of muscle and tendon of front wall of thorax, initial encounter: Secondary | ICD-10-CM

## 2013-11-15 DIAGNOSIS — K7689 Other specified diseases of liver: Secondary | ICD-10-CM

## 2013-11-15 DIAGNOSIS — H9201 Otalgia, right ear: Secondary | ICD-10-CM

## 2013-11-15 DIAGNOSIS — K760 Fatty (change of) liver, not elsewhere classified: Secondary | ICD-10-CM

## 2013-11-15 DIAGNOSIS — I10 Essential (primary) hypertension: Secondary | ICD-10-CM

## 2013-11-15 LAB — CBC WITH DIFFERENTIAL/PLATELET
Basophils Absolute: 0.1 10*3/uL (ref 0.0–0.1)
Basophils Relative: 1 % (ref 0–1)
EOS ABS: 0.3 10*3/uL (ref 0.0–0.7)
Eosinophils Relative: 5 % (ref 0–5)
HCT: 37.5 % (ref 36.0–46.0)
HEMOGLOBIN: 13.1 g/dL (ref 12.0–15.0)
LYMPHS ABS: 1.8 10*3/uL (ref 0.7–4.0)
LYMPHS PCT: 31 % (ref 12–46)
MCH: 32.7 pg (ref 26.0–34.0)
MCHC: 34.9 g/dL (ref 30.0–36.0)
MCV: 93.5 fL (ref 78.0–100.0)
MONOS PCT: 10 % (ref 3–12)
Monocytes Absolute: 0.6 10*3/uL (ref 0.1–1.0)
Neutro Abs: 3.1 10*3/uL (ref 1.7–7.7)
Neutrophils Relative %: 53 % (ref 43–77)
PLATELETS: 212 10*3/uL (ref 150–400)
RBC: 4.01 MIL/uL (ref 3.87–5.11)
RDW: 12.8 % (ref 11.5–15.5)
WBC: 5.7 10*3/uL (ref 4.0–10.5)

## 2013-11-15 MED ORDER — CYCLOBENZAPRINE HCL 5 MG PO TABS: 5.0000 mg | ORAL_TABLET | Freq: Three times a day (TID) | ORAL | Status: DC | PRN

## 2013-11-15 MED ORDER — ANTIPYRINE-BENZOCAINE 5.4-1.4 % OT SOLN: 3.0000 [drp] | OTIC | Status: DC | PRN

## 2013-11-15 NOTE — Assessment & Plan Note (Signed)
Given AB otic, stop old cortisporin no signs of infection Other differentials atypical migraine, TMJ pain

## 2013-11-15 NOTE — Assessment & Plan Note (Signed)
Recheck FLP, CMET, has f/u with  GI in [facility]

## 2013-11-15 NOTE — Assessment & Plan Note (Signed)
Recheck FLP, liver function

## 2013-11-15 NOTE — Progress Notes (Signed)
Patient ID: [name], female   DOB: Jul 18, 1951, 63 y.o.   MRN: [id number]   Subjective:    Patient ID: [name], female    DOB: 04/07/1951, 63 y.o.   MRN: [id number]  Patient presents for 3 mos follow up  patient here to followup chronic medical problems. She has a few concerns today. She continues to have pain into her right ear that radiates down her jaw and into her neck she also says that she has lost hearing in this ear as well. She has been using an old Cortisporin ear drop which gives her some minimal relief.  She also complains of left chest wall pain she went to pick up something out of a And and grabbed it very quickly and felt a pain in her chest and side since then. She has been using a topical anti-inflammatory or it which gives some relief. She did try taking a very old muscle relaxer but it did not help   Asthma-since she stopped all the other medications for her abdominal pain her cough actually improved and she's not using the Advair at this time.  Fatty liver disease she is followup with a specialist in [facility] for her chronic abdominal pain interestingly she has stopped all of her GI medications and her pain has resolved since she's not sure she will keep this appointment. She is due for repeat liver function tests. She's currently on TriCor.    Review Of Systems:  GEN- denies fatigue, fever, weight loss,weakness, recent illness HEENT- denies eye drainage, change in vision, nasal discharge, CVS- denies chest pain, palpitations RESP- denies SOB, cough, wheeze ABD- denies N/V, change in stools, abd pain GU- denies dysuria, hematuria, dribbling, incontinence MSK- denies joint pain,+ muscle aches, injury Neuro- denies headache, dizziness, syncope, seizure activity       Objective:    BP 110/68  Pulse 98  Temp(Src) 98.7 F (37.1 C) (Oral)  Resp 18  Ht 5' 5"  (1.651 m)  Wt 200 lb (90.719 kg)  BMI 33.28 kg/m2 GEN- NAD, alert and oriented x3 HEENT-  PERRL, EOMI, non injected sclera, pink conjunctiva, MMM, oropharynx clear, TM clear bilat,  CVS- RRR, no murmur RESP-CTAB ABD-NABS,soft,NT,ND MSK- TTP along left side and left chest wall, normal ROM shoulder, TTP along triceps region EXT- No edema Pulses- Radial, DP- 2+        Assessment & Plan:      Problem List Items Addressed This Visit   Otalgia of right ear   Relevant Orders      Ambulatory referral to ENT   NAFLD (nonalcoholic fatty liver disease)     Recheck FLP, CMET, has f/u with  GI in [place]    Relevant Orders      Comprehensive metabolic panel   Lung nodule   Relevant Orders      CT Chest Wo Contrast   Hyperlipidemia     Recheck FLP, liver function    Relevant Medications      irbesartan (AVAPRO) 150 MG tablet   Other Relevant Orders      Lipid panel   Hearing loss   Relevant Orders      Ambulatory referral to ENT   Essential hypertension, benign - Primary   Relevant Orders      CBC with Differential      Note: This dictation was prepared with Dragon dictation along with smaller phrase technology. Any transcriptional errors that result from this process are unintentional.

## 2013-11-15 NOTE — Assessment & Plan Note (Signed)
Well controlled, she is actually cutting the irbesartan in half, continue at 38m and continue HCTZ 12.526mdaily

## 2013-11-15 NOTE — Patient Instructions (Signed)
Refill to [facility] to be done Flexeril for muscle spasm- sent to [place] drop  Referral to ENT CT of chest to be done F/U 3 months

## 2013-11-16 LAB — COMPREHENSIVE METABOLIC PANEL
ALT: 34 U/L (ref 0–35)
AST: 41 U/L — ABNORMAL HIGH (ref 0–37)
Albumin: 4.9 g/dL (ref 3.5–5.2)
Alkaline Phosphatase: 53 U/L (ref 39–117)
BILIRUBIN TOTAL: 0.6 mg/dL (ref 0.2–1.2)
BUN: 18 mg/dL (ref 6–23)
CHLORIDE: 100 meq/L (ref 96–112)
CO2: 27 meq/L (ref 19–32)
Calcium: 9.8 mg/dL (ref 8.4–10.5)
Creat: 1.07 mg/dL (ref 0.50–1.10)
GLUCOSE: 79 mg/dL (ref 70–99)
Potassium: 4.6 mEq/L (ref 3.5–5.3)
Sodium: 135 mEq/L (ref 135–145)
Total Protein: 7.3 g/dL (ref 6.0–8.3)

## 2013-11-16 LAB — LIPID PANEL
CHOLESTEROL: 185 mg/dL (ref 0–200)
HDL: 38 mg/dL — ABNORMAL LOW (ref >39)
LDL Cholesterol: 104 mg/dL — ABNORMAL HIGH (ref 0–99)
TRIGLYCERIDES: 214 mg/dL — AB (ref <150)
Total CHOL/HDL Ratio: 4.9 Ratio
VLDL: 43 mg/dL — ABNORMAL HIGH (ref 0–40)

## 2013-11-17 ENCOUNTER — Other Ambulatory Visit: Payer: Self-pay | Admitting: *Deleted

## 2013-11-17 MED ORDER — LATANOPROST 0.005 % OP SOLN: 1.0000 [drp] | Freq: Every day | OPHTHALMIC | Status: DC

## 2013-11-17 MED ORDER — FENOFIBRATE 145 MG PO TABS: 145.0000 mg | ORAL_TABLET | Freq: Every day | ORAL | Status: DC

## 2013-11-17 MED ORDER — BRIMONIDINE TARTRATE 0.2 % OP SOLN: 1.0000 [drp] | Freq: Two times a day (BID) | OPHTHALMIC | Status: DC

## 2013-11-17 MED ORDER — BRIMONIDINE TARTRATE 0.2 % OP SOLN
1.0000 [drp] | Freq: Two times a day (BID) | OPHTHALMIC | Status: DC
Start: 2013-11-17 — End: 2013-11-17

## 2013-11-17 NOTE — Telephone Encounter (Signed)
Requested meds sent to [facility]

## 2013-11-27 ENCOUNTER — Ambulatory Visit (HOSPITAL_COMMUNITY)
Admission: RE | Admit: 2013-11-27 | Discharge: 2013-11-27 | Disposition: A | Discharge status: Home / Self Care | Source: Ambulatory Visit | Attending: Family Medicine | Admitting: Family Medicine

## 2013-11-27 DIAGNOSIS — J9819 Other pulmonary collapse: Secondary | ICD-10-CM | POA: Insufficient documentation

## 2013-11-27 DIAGNOSIS — K7689 Other specified diseases of liver: Secondary | ICD-10-CM | POA: Insufficient documentation

## 2013-11-27 DIAGNOSIS — Z09 Encounter for follow-up examination after completed treatment for conditions other than malignant neoplasm: Secondary | ICD-10-CM | POA: Insufficient documentation

## 2013-11-27 DIAGNOSIS — R911 Solitary pulmonary nodule: Secondary | ICD-10-CM | POA: Insufficient documentation

## 2013-12-04 ENCOUNTER — Other Ambulatory Visit (HOSPITAL_COMMUNITY)

## 2013-12-04 ENCOUNTER — Ambulatory Visit (HOSPITAL_COMMUNITY)

## 2013-12-14 ENCOUNTER — Ambulatory Visit (INDEPENDENT_AMBULATORY_CARE_PROVIDER_SITE_OTHER): Admitting: Otolaryngology

## 2013-12-14 DIAGNOSIS — R070 Pain in throat: Secondary | ICD-10-CM

## 2013-12-14 DIAGNOSIS — H9209 Otalgia, unspecified ear: Secondary | ICD-10-CM

## 2013-12-14 DIAGNOSIS — H903 Sensorineural hearing loss, bilateral: Secondary | ICD-10-CM

## 2014-01-11 ENCOUNTER — Encounter: Payer: Self-pay | Admitting: Family Medicine

## 2014-02-12 ENCOUNTER — Ambulatory Visit: Admitting: Family Medicine

## 2014-02-16 ENCOUNTER — Telehealth: Payer: Self-pay | Admitting: Family Medicine

## 2014-02-16 MED ORDER — BRIMONIDINE TARTRATE 0.2 % OP SOLN: 1.0000 [drp] | Freq: Two times a day (BID) | OPHTHALMIC | Status: DC

## 2014-02-16 MED ORDER — FENOFIBRATE 145 MG PO TABS: 145.0000 mg | ORAL_TABLET | Freq: Every day | ORAL | Status: DC

## 2014-02-16 MED ORDER — ZOLPIDEM TARTRATE 10 MG PO TABS: 10.0000 mg | ORAL_TABLET | Freq: Every day | ORAL | Status: DC

## 2014-02-16 NOTE — Telephone Encounter (Signed)
Patient is calling to get refills on her brimonidine (ALPHAGAN) 0.2 % ophthalmic solution,zolpidem (AMBIEN) 10 MG tablet,and tricor,  Please call her with questions, she said she would like dr Buelah Manis to call her because she knows her situation  [phone]

## 2014-02-16 NOTE — Telephone Encounter (Signed)
Okay to refill meds, she uses the [place]

## 2014-02-16 NOTE — Telephone Encounter (Signed)
RX faxed to [place].

## 2014-02-20 ENCOUNTER — Telehealth: Payer: Self-pay | Admitting: *Deleted

## 2014-02-20 MED ORDER — ZOLPIDEM TARTRATE 10 MG PO TABS: 10.0000 mg | ORAL_TABLET | Freq: Every day | ORAL | Status: DC

## 2014-02-20 NOTE — Telephone Encounter (Signed)
Ambien prescription printed and faxed to [facility] 02/16/2014.   Patient states that she has contacted [facility] and prescription was not received.   Requested to have prescription called to [facility] for 30 day supply and refill faxed to [facility].   MD please advise.

## 2014-02-20 NOTE — Telephone Encounter (Signed)
Medication called to pharmacy.  Call placed to patient and patient made aware.   Advised that [facility] states that they are not sure if fax was received on 02/16/2014, but requested (1) week to filter through multiple faxes.

## 2014-02-20 NOTE — Telephone Encounter (Signed)
Ok to refill to [facility] for 30 day supply

## 2014-02-20 NOTE — Telephone Encounter (Signed)
Message copied by Sheral Flow on Tue Feb 20, 2014  9:57 AM ------      Message from: Devoria Glassing      Created: Tue Feb 20, 2014  9:42 AM       Patient [facility] Now she has realized she has missed on of the meds and it is zolpidem, she would need a script sent in to [facility] for a few of these because she is out and a [facility] like [facility] if any questions please call [phone] ------

## 2014-03-20 ENCOUNTER — Encounter: Payer: Self-pay | Admitting: Family Medicine

## 2014-03-20 ENCOUNTER — Ambulatory Visit (INDEPENDENT_AMBULATORY_CARE_PROVIDER_SITE_OTHER): Admitting: Family Medicine

## 2014-03-20 VITALS — BP 122/68 | HR 84 | Temp 98.7°F | Resp 16 | Ht 65.0 in | Wt 193.0 lb

## 2014-03-20 DIAGNOSIS — F411 Generalized anxiety disorder: Secondary | ICD-10-CM

## 2014-03-20 DIAGNOSIS — F4320 Adjustment disorder, unspecified: Secondary | ICD-10-CM

## 2014-03-20 DIAGNOSIS — F4321 Adjustment disorder with depressed mood: Secondary | ICD-10-CM

## 2014-03-20 DIAGNOSIS — F419 Anxiety disorder, unspecified: Secondary | ICD-10-CM

## 2014-03-20 DIAGNOSIS — I10 Essential (primary) hypertension: Secondary | ICD-10-CM

## 2014-03-20 DIAGNOSIS — E785 Hyperlipidemia, unspecified: Secondary | ICD-10-CM

## 2014-03-20 DIAGNOSIS — N182 Chronic kidney disease, stage 2 (mild): Secondary | ICD-10-CM

## 2014-03-20 DIAGNOSIS — Z634 Disappearance and death of family member: Secondary | ICD-10-CM | POA: Insufficient documentation

## 2014-03-20 NOTE — Assessment & Plan Note (Signed)
Blood pressure looks okay she's actually been taking the entire tablet of irbesartan therefore we will continue

## 2014-03-20 NOTE — Patient Instructions (Signed)
Medication to be sent in [facility] [place] Increase xanax to twice a day Get labs done in [place] when you return F/U 8 weeks

## 2014-03-20 NOTE — Assessment & Plan Note (Signed)
Recheck renal function when she returns

## 2014-03-20 NOTE — Progress Notes (Signed)
Patient ID: [name], female   DOB: 1951-08-15, 63 y.o.   MRN: [id number]   Subjective:    Patient ID: [name], female    DOB: 07-Jan-1951, 63 y.o.   MRN: [id number]  Patient presents for F/U and Needs medical records  patient is here followup chronic medical problems. Unfortunately her husband who is battling cancer passed away about a week ago. She's been having a lot of difficulty since then. Her adult son is here from [place] who is helping her take care of the family to stay. She's planning to go on a trip to [place] for about 2 weeks to be with him. She's not sleeping very well therefore started taking her Ambien. She still takes her Xanax has been taking it once in the evening but thinks that she may need in the daytime as well. She is taking all of her other medications as prescribed. Her husband was in hospice for 3 weeks before he passed away.  She did have some chest discomfort the other day but thought it was due to being overwhelmed and anxiety after her husband passed away. She's not had any shortness of breath or recurrent chest pain. No recent abdominal pain    Review Of Systems:  GEN- +s fatigue, fever, weight loss,weakness, recent illness HEENT- denies eye drainage, change in vision, nasal discharge, CVS- + chest pain, palpitations RESP- denies SOB, cough, wheeze ABD- denies N/V, change in stools, abd pain GU- denies dysuria, hematuria, dribbling, incontinence MSK- denies joint pain, muscle aches, injury Neuro- denies headache, dizziness, syncope, seizure activity       Objective:    BP 122/68  Pulse 84  Temp(Src) 98.7 F (37.1 C) (Oral)  Resp 16  Ht 5' 5"  (1.651 m)  Wt 193 lb (87.544 kg)  BMI 32.12 kg/m2 GEN- NAD, alert and oriented x3 HEENT- PERRL, EOMI, non injected sclera, pink conjunctiva, MMM, oropharynx clear CVS- RRR, no murmur RESP-CTAB Psych- depressed, cyring, no SI, normal speech, no hallucinations, good eye contact, well  groomed EXT- No edema Pulses- Radial 2+        Assessment & Plan:      Problem List Items Addressed This Visit   None      Note: This dictation was prepared with Dragon dictation along with smaller phrase technology. Any transcriptional errors that result from this process are unintentional.

## 2014-03-20 NOTE — Assessment & Plan Note (Signed)
Her husband recently passed away. We will try to provide support in any way we can. She is on Ambien for sleep she is also using Xanax she can take half a tablet during the day and the one at night but she has been doing. I did recommend that she get a [place] to be with her son and his family and she has no family are very close friends here in town. She will return in about 2 weeks. We'll follow up in the clinic after she returns

## 2014-03-20 NOTE — Assessment & Plan Note (Signed)
Per above

## 2014-03-22 ENCOUNTER — Other Ambulatory Visit: Payer: Self-pay | Admitting: *Deleted

## 2014-03-22 DIAGNOSIS — I10 Essential (primary) hypertension: Secondary | ICD-10-CM

## 2014-03-22 MED ORDER — HYDROCHLOROTHIAZIDE 12.5 MG PO TABS: 12.5000 mg | ORAL_TABLET | Freq: Every day | ORAL | Status: DC

## 2014-03-22 MED ORDER — IRBESARTAN 150 MG PO TABS: 150.0000 mg | ORAL_TABLET | Freq: Every day | ORAL | Status: DC

## 2014-03-22 MED ORDER — LATANOPROST 0.005 % OP SOLN: 1.0000 [drp] | Freq: Every day | OPHTHALMIC | Status: DC

## 2014-03-22 MED ORDER — BRIMONIDINE TARTRATE 0.2 % OP SOLN: 1.0000 [drp] | Freq: Two times a day (BID) | OPHTHALMIC | Status: DC

## 2014-03-22 MED ORDER — HYDROCORTISONE ACETATE 25 MG RE SUPP: 25.0000 mg | Freq: Two times a day (BID) | RECTAL | Status: DC | PRN

## 2014-03-22 MED ORDER — NITROGLYCERIN 0.4 MG/SPRAY TL SOLN: 1.0000 | Status: DC | PRN

## 2014-03-22 MED ORDER — ZOLPIDEM TARTRATE 10 MG PO TABS: 10.0000 mg | ORAL_TABLET | Freq: Every day | ORAL | Status: DC

## 2014-03-22 MED ORDER — FLUTICASONE PROPIONATE HFA 110 MCG/ACT IN AERO: 2.0000 | INHALATION_SPRAY | Freq: Two times a day (BID) | RESPIRATORY_TRACT | Status: DC

## 2014-03-22 MED ORDER — ALBUTEROL SULFATE HFA 108 (90 BASE) MCG/ACT IN AERS: 2.0000 | INHALATION_SPRAY | RESPIRATORY_TRACT | Status: DC | PRN

## 2014-03-22 MED ORDER — ANTIPYRINE-BENZOCAINE 5.4-1.4 % OT SOLN: 3.0000 [drp] | OTIC | Status: DC | PRN

## 2014-03-22 MED ORDER — ALPRAZOLAM 1 MG PO TABS: 1.0000 mg | ORAL_TABLET | Freq: Two times a day (BID) | ORAL | Status: DC | PRN

## 2014-03-22 MED ORDER — FENOFIBRATE 145 MG PO TABS: 145.0000 mg | ORAL_TABLET | Freq: Every day | ORAL | Status: DC

## 2014-03-30 ENCOUNTER — Telehealth: Payer: Self-pay | Admitting: *Deleted

## 2014-03-30 MED ORDER — HYDROCOD POLST-CHLORPHEN POLST 10-8 MG/5ML PO LQCR
5.0000 mL | Freq: Two times a day (BID) | ORAL | Status: DC | PRN
Start: 2014-03-30 — End: 2014-08-03

## 2014-03-30 NOTE — Telephone Encounter (Signed)
Message copied by Sheral Flow on Fri Mar 30, 2014  4:17 PM ------      Message from: Devoria Glassing      Created: Fri Mar 30, 2014  2:18 PM       Patient would like a call back about her tricor and hydrocodone cough med       [phone] ------

## 2014-03-30 NOTE — Telephone Encounter (Signed)
Call placed to patient to identify medication she is requesting.   MD made aware and approved orders.   Prescription printed and patient made aware to come to office to pick up.

## 2014-03-31 ENCOUNTER — Observation Stay (HOSPITAL_COMMUNITY)
Admission: EM | Admit: 2014-03-31 | Discharge: 2014-04-01 | Disposition: A | Discharge status: Home / Self Care | Attending: Cardiovascular Disease | Admitting: Cardiovascular Disease

## 2014-03-31 ENCOUNTER — Encounter (HOSPITAL_COMMUNITY): Payer: Self-pay | Admitting: Emergency Medicine

## 2014-03-31 ENCOUNTER — Emergency Department (HOSPITAL_COMMUNITY)

## 2014-03-31 DIAGNOSIS — D573 Sickle-cell trait: Secondary | ICD-10-CM | POA: Insufficient documentation

## 2014-03-31 DIAGNOSIS — K589 Irritable bowel syndrome without diarrhea: Secondary | ICD-10-CM | POA: Insufficient documentation

## 2014-03-31 DIAGNOSIS — E876 Hypokalemia: Secondary | ICD-10-CM | POA: Insufficient documentation

## 2014-03-31 DIAGNOSIS — E785 Hyperlipidemia, unspecified: Secondary | ICD-10-CM | POA: Diagnosis present

## 2014-03-31 DIAGNOSIS — E669 Obesity, unspecified: Secondary | ICD-10-CM

## 2014-03-31 DIAGNOSIS — R079 Chest pain, unspecified: Principal | ICD-10-CM | POA: Diagnosis present

## 2014-03-31 DIAGNOSIS — F4321 Adjustment disorder with depressed mood: Secondary | ICD-10-CM | POA: Diagnosis present

## 2014-03-31 DIAGNOSIS — I517 Cardiomegaly: Secondary | ICD-10-CM | POA: Insufficient documentation

## 2014-03-31 DIAGNOSIS — H409 Unspecified glaucoma: Secondary | ICD-10-CM | POA: Insufficient documentation

## 2014-03-31 DIAGNOSIS — Z87891 Personal history of nicotine dependence: Secondary | ICD-10-CM | POA: Insufficient documentation

## 2014-03-31 DIAGNOSIS — J45909 Unspecified asthma, uncomplicated: Secondary | ICD-10-CM | POA: Diagnosis present

## 2014-03-31 DIAGNOSIS — R0609 Other forms of dyspnea: Secondary | ICD-10-CM | POA: Insufficient documentation

## 2014-03-31 DIAGNOSIS — E7800 Pure hypercholesterolemia, unspecified: Secondary | ICD-10-CM | POA: Insufficient documentation

## 2014-03-31 DIAGNOSIS — I10 Essential (primary) hypertension: Secondary | ICD-10-CM | POA: Diagnosis present

## 2014-03-31 DIAGNOSIS — D551 Anemia due to other disorders of glutathione metabolism: Secondary | ICD-10-CM | POA: Insufficient documentation

## 2014-03-31 DIAGNOSIS — F419 Anxiety disorder, unspecified: Secondary | ICD-10-CM | POA: Diagnosis present

## 2014-03-31 DIAGNOSIS — R9431 Abnormal electrocardiogram [ECG] [EKG]: Secondary | ICD-10-CM

## 2014-03-31 DIAGNOSIS — Z634 Disappearance and death of family member: Secondary | ICD-10-CM | POA: Diagnosis present

## 2014-03-31 DIAGNOSIS — F411 Generalized anxiety disorder: Secondary | ICD-10-CM | POA: Insufficient documentation

## 2014-03-31 DIAGNOSIS — K7689 Other specified diseases of liver: Secondary | ICD-10-CM | POA: Insufficient documentation

## 2014-03-31 DIAGNOSIS — R0989 Other specified symptoms and signs involving the circulatory and respiratory systems: Secondary | ICD-10-CM | POA: Insufficient documentation

## 2014-03-31 DIAGNOSIS — R0602 Shortness of breath: Secondary | ICD-10-CM

## 2014-03-31 HISTORY — DX: Chest pain, unspecified: R07.9

## 2014-03-31 HISTORY — DX: Palpitations: R00.2

## 2014-03-31 LAB — CBC
HEMATOCRIT: 32.6 % — AB (ref 36.0–46.0)
Hemoglobin: 11.4 g/dL — ABNORMAL LOW (ref 12.0–15.0)
MCH: 33.6 pg (ref 26.0–34.0)
MCHC: 35 g/dL (ref 30.0–36.0)
MCV: 96.2 fL (ref 78.0–100.0)
Platelets: 155 10*3/uL (ref 150–400)
RBC: 3.39 MIL/uL — AB (ref 3.87–5.11)
RDW: 12.4 % (ref 11.5–15.5)
WBC: 3.9 10*3/uL — AB (ref 4.0–10.5)

## 2014-03-31 LAB — BASIC METABOLIC PANEL
BUN: 14 mg/dL (ref 6–23)
CHLORIDE: 106 meq/L (ref 96–112)
CO2: 21 meq/L (ref 19–32)
Calcium: 9.4 mg/dL (ref 8.4–10.5)
Creatinine, Ser: 1 mg/dL (ref 0.50–1.10)
GFR calc Af Amer: 69 mL/min — ABNORMAL LOW (ref >90)
GFR, EST NON AFRICAN AMERICAN: 60 mL/min — AB (ref >90)
GLUCOSE: 142 mg/dL — AB (ref 70–99)
Potassium: 3.6 mEq/L — ABNORMAL LOW (ref 3.7–5.3)
SODIUM: 143 meq/L (ref 137–147)

## 2014-03-31 LAB — TSH: TSH: 6.16 u[IU]/mL — ABNORMAL HIGH (ref 0.350–4.500)

## 2014-03-31 LAB — TROPONIN I
Troponin I: <0.3 ng/mL (ref <0.30)
Troponin I: <0.3 ng/mL (ref <0.30)

## 2014-03-31 LAB — HEPARIN LEVEL (UNFRACTIONATED)
HEPARIN UNFRACTIONATED: 0.38 [IU]/mL (ref 0.30–0.70)
Heparin Unfractionated: 0.2 IU/mL — ABNORMAL LOW (ref 0.30–0.70)

## 2014-03-31 LAB — MRSA PCR SCREENING: MRSA BY PCR: NEGATIVE

## 2014-03-31 LAB — PROTIME-INR
INR: 1.05 (ref 0.00–1.49)
PROTHROMBIN TIME: 13.7 s (ref 11.6–15.2)

## 2014-03-31 MED ORDER — HYDROCORTISONE ACETATE 25 MG RE SUPP
25.0000 mg | Freq: Two times a day (BID) | RECTAL | Status: DC | PRN
  Filled 2014-03-31: qty 1

## 2014-03-31 MED ORDER — ALPRAZOLAM 0.5 MG PO TABS
1.0000 mg | ORAL_TABLET | Freq: Three times a day (TID) | ORAL | Status: DC
  Administered 2014-03-31 – 2014-04-01 (×4): 1 mg via ORAL
  Filled 2014-03-31 (×4): qty 2

## 2014-03-31 MED ORDER — ALUM & MAG HYDROXIDE-SIMETH 200-200-20 MG/5ML PO SUSP: 30.0000 mL | ORAL | Status: DC | PRN

## 2014-03-31 MED ORDER — ACETAMINOPHEN 325 MG PO TABS: 650.0000 mg | ORAL_TABLET | ORAL | Status: DC | PRN

## 2014-03-31 MED ORDER — MORPHINE SULFATE 4 MG/ML IJ SOLN
4.0000 mg | Freq: Once | INTRAMUSCULAR | Status: AC
Start: 2014-03-31 — End: 2014-03-31
  Administered 2014-03-31: 4 mg via INTRAVENOUS
  Filled 2014-03-31: qty 1

## 2014-03-31 MED ORDER — ACETAMINOPHEN 325 MG PO TABS
650.0000 mg | ORAL_TABLET | Freq: Once | ORAL | Status: AC
  Administered 2014-03-31: 650 mg via ORAL
  Filled 2014-03-31: qty 2

## 2014-03-31 MED ORDER — HYDROCHLOROTHIAZIDE 12.5 MG PO CAPS
12.5000 mg | ORAL_CAPSULE | Freq: Every day | ORAL | Status: DC
  Administered 2014-04-01: 12.5 mg via ORAL
  Filled 2014-03-31 (×2): qty 1

## 2014-03-31 MED ORDER — NITROGLYCERIN 0.4 MG SL SUBL: 0.4000 mg | SUBLINGUAL_TABLET | SUBLINGUAL | Status: DC | PRN

## 2014-03-31 MED ORDER — ALBUTEROL SULFATE (2.5 MG/3ML) 0.083% IN NEBU: 3.0000 mL | INHALATION_SOLUTION | RESPIRATORY_TRACT | Status: DC | PRN

## 2014-03-31 MED ORDER — BRIMONIDINE TARTRATE 0.2 % OP SOLN
1.0000 [drp] | Freq: Two times a day (BID) | OPHTHALMIC | Status: DC
  Administered 2014-03-31: 1 [drp] via OPHTHALMIC
  Filled 2014-03-31: qty 5

## 2014-03-31 MED ORDER — ANTIPYRINE-BENZOCAINE 5.4-1.4 % OT SOLN
3.0000 [drp] | OTIC | Status: DC | PRN
  Filled 2014-03-31: qty 10

## 2014-03-31 MED ORDER — NITROGLYCERIN IN D5W 200-5 MCG/ML-% IV SOLN
5.0000 ug/min | Freq: Once | INTRAVENOUS | Status: AC
  Administered 2014-03-31: 5 ug/min via INTRAVENOUS
  Filled 2014-03-31: qty 1250

## 2014-03-31 MED ORDER — FLUTICASONE PROPIONATE HFA 110 MCG/ACT IN AERO
2.0000 | INHALATION_SPRAY | Freq: Two times a day (BID) | RESPIRATORY_TRACT | Status: DC
  Administered 2014-03-31 – 2014-04-01 (×2): 2 via RESPIRATORY_TRACT
  Filled 2014-03-31: qty 12

## 2014-03-31 MED ORDER — POLYETHYLENE GLYCOL 3350 17 G PO PACK
17.0000 g | PACK | Freq: Every day | ORAL | Status: DC
  Administered 2014-03-31: 17 g via ORAL
  Filled 2014-03-31 (×2): qty 1

## 2014-03-31 MED ORDER — FENOFIBRATE 160 MG PO TABS
160.0000 mg | ORAL_TABLET | Freq: Every day | ORAL | Status: DC
  Administered 2014-03-31 – 2014-04-01 (×2): 160 mg via ORAL
  Filled 2014-03-31 (×2): qty 1

## 2014-03-31 MED ORDER — HEPARIN BOLUS VIA INFUSION
4000.0000 [IU] | Freq: Once | INTRAVENOUS | Status: AC
  Administered 2014-03-31: 4000 [IU] via INTRAVENOUS

## 2014-03-31 MED ORDER — PANTOPRAZOLE SODIUM 40 MG PO TBEC
40.0000 mg | DELAYED_RELEASE_TABLET | Freq: Every day | ORAL | Status: DC
  Administered 2014-04-01: 40 mg via ORAL
  Filled 2014-03-31: qty 1

## 2014-03-31 MED ORDER — MORPHINE SULFATE 2 MG/ML IJ SOLN
2.0000 mg | INTRAMUSCULAR | Status: DC | PRN
  Administered 2014-03-31 – 2014-04-01 (×4): 2 mg via INTRAVENOUS
  Filled 2014-03-31 (×4): qty 1

## 2014-03-31 MED ORDER — HEPARIN (PORCINE) IN NACL 100-0.45 UNIT/ML-% IJ SOLN
1200.0000 [IU]/h | INTRAMUSCULAR | Status: DC
  Administered 2014-03-31 (×2): 950 [IU]/h via INTRAVENOUS
  Filled 2014-03-31 (×4): qty 250

## 2014-03-31 MED ORDER — HYDROCHLOROTHIAZIDE 25 MG PO TABS
12.5000 mg | ORAL_TABLET | Freq: Every day | ORAL | Status: DC
  Filled 2014-03-31: qty 0.5

## 2014-03-31 MED ORDER — IRBESARTAN 150 MG PO TABS
150.0000 mg | ORAL_TABLET | Freq: Every day | ORAL | Status: DC
  Administered 2014-03-31: 150 mg via ORAL
  Filled 2014-03-31 (×2): qty 1

## 2014-03-31 MED ORDER — ONDANSETRON HCL 4 MG/2ML IJ SOLN: 4.0000 mg | Freq: Four times a day (QID) | INTRAMUSCULAR | Status: DC | PRN

## 2014-03-31 MED ORDER — MORPHINE SULFATE 4 MG/ML IJ SOLN
4.0000 mg | Freq: Once | INTRAMUSCULAR | Status: AC
  Administered 2014-03-31: 4 mg via INTRAVENOUS
  Filled 2014-03-31: qty 1

## 2014-03-31 MED ORDER — LORATADINE 10 MG PO TABS
10.0000 mg | ORAL_TABLET | Freq: Every day | ORAL | Status: DC | PRN
  Filled 2014-03-31: qty 1

## 2014-03-31 MED ORDER — NITROGLYCERIN 0.4 MG SL SUBL
SUBLINGUAL_TABLET | SUBLINGUAL | Status: AC
Start: 2014-03-31 — End: 2014-03-31
  Administered 2014-03-31: 0.4 mg via SUBLINGUAL
  Filled 2014-03-31: qty 2.5

## 2014-03-31 MED ORDER — POTASSIUM CHLORIDE CRYS ER 20 MEQ PO TBCR
40.0000 meq | EXTENDED_RELEASE_TABLET | Freq: Once | ORAL | Status: AC
  Administered 2014-03-31: 40 meq via ORAL
  Filled 2014-03-31: qty 2

## 2014-03-31 MED ORDER — ZOLPIDEM TARTRATE 5 MG PO TABS
5.0000 mg | ORAL_TABLET | Freq: Every day | ORAL | Status: DC
  Administered 2014-03-31: 5 mg via ORAL
  Filled 2014-03-31: qty 1

## 2014-03-31 MED ORDER — LATANOPROST 0.005 % OP SOLN
1.0000 [drp] | Freq: Every day | OPHTHALMIC | Status: DC
  Administered 2014-03-31: 1 [drp] via OPHTHALMIC
  Filled 2014-03-31: qty 2.5

## 2014-03-31 MED ORDER — HEPARIN BOLUS VIA INFUSION
2000.0000 [IU] | Freq: Once | INTRAVENOUS | Status: AC
  Administered 2014-03-31: 2000 [IU] via INTRAVENOUS
  Filled 2014-03-31: qty 2000

## 2014-03-31 MED ORDER — GI COCKTAIL ~~LOC~~
30.0000 mL | Freq: Once | ORAL | Status: AC
  Administered 2014-03-31: 30 mL via ORAL
  Filled 2014-03-31: qty 30

## 2014-03-31 MED ORDER — NITROGLYCERIN 0.4 MG SL SUBL
0.4000 mg | SUBLINGUAL_TABLET | SUBLINGUAL | Status: DC | PRN
  Administered 2014-03-31 (×2): 0.4 mg via SUBLINGUAL

## 2014-03-31 NOTE — ED Notes (Signed)
Report given to [facility] with [facility]
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
## 2014-03-31 NOTE — H&P (Signed)
The patient was seen and examined, and I agree with the assessment and plan as documented above, with modifications as noted below. 63 yr old woman who recently lost her husband to advanced prostate cancer presents with chest pain since last night. ECG unchanged from prior and 1st troponin normal. She has a history significant for anxiety with normal nuclear perfusion studies in the past. Chest wall also tender to palpation. Pain likely represents symptoms related to normal grief reaction after husband's passing, and would benefit from counseling. That being said, will obtain serial troponins and if they remain normal, would arrange for outpatient stress testing.
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
## 2014-03-31 NOTE — ED Notes (Signed)
Report given to Ivin Booty, Therapist, sports at [facility].

## 2014-03-31 NOTE — Progress Notes (Signed)
[place] for Heparin Indication: chest pain/ACS  Allergies  Allergen Reactions  . Adhesive [Tape] Other (See Comments)    Blisters skin  . Aspirin Other (See Comments)    sickle cell trait  . Statins Other (See Comments)    Myopathy - elevated CK  . Sulfonamide Derivatives Other (See Comments)    Patient has sickle cell trait  . Latex Rash    Patient Measurements: Height: 5' 7"  (170.2 cm) Weight: 195 lb 8.8 oz (88.7 kg) IBW/kg (Calculated) : 61.6 Heparin Dosing Weight: 80kg  Vital Signs: Temp: 99.3 F (37.4 C) (06/27 2106) Temp src: Oral (06/27 2106) BP: 135/74 mmHg (06/27 2106) Pulse Rate: 84 (06/27 1954)  Labs:  Recent Labs  03/31/14 0258 03/31/14 0403 03/31/14 0858 03/31/14 0920 03/31/14 1358 03/31/14 2055 03/31/14 2140  HGB 11.4*  --   --   --   --   --   --   HCT 32.6*  --   --   --   --   --   --   PLT 155  --   --   --   --   --   --   LABPROT  --  13.7  --   --   --   --   --   INR  --  1.05  --   --   --   --   --   HEPARINUNFRC  --   --   --  0.38  --   --  0.20*  CREATININE 1.00  --   --   --   --   --   --   TROPONINI <0.30  --  <0.30  --  <0.30 <0.30  --     Estimated Creatinine Clearance: 67.5 ml/min (by C-G formula based on Cr of 1).  Assessment: 63 y.o. female with chest pain for heparin   Goal of Therapy:  Heparin level 0.3-0.7 units/ml Monitor platelets by anticoagulation protocol: Yes   Plan:  Heparin 2000 units IV bolus, then increase heparin 1200 units/hr Follow-up am labs.   Phillis Knack, PharmD, BCPS  03/31/2014 10:53 PM

## 2014-03-31 NOTE — ED Notes (Signed)
Pt c/o left sided chest pain that woke her up from sleep and states the pain is radiating to her back and up to her neck and jaw; pt states she has been under a lot of stress over the last few weeks
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
## 2014-03-31 NOTE — ED Notes (Signed)
Pt rating her pain at a 4 and still c/o sob; increased nitroglycerin drip to 10 mcg/min

## 2014-03-31 NOTE — Progress Notes (Signed)
Notified Md per pt's request for metamucil.  VS stable will continue to monitor. Saunders Revel T

## 2014-03-31 NOTE — Progress Notes (Signed)
[place] for Heparin Indication: chest pain/ACS  Allergies  Allergen Reactions  . Adhesive [Tape] Other (See Comments)    Blisters skin  . Aspirin Other (See Comments)    sickle cell trait  . Statins Other (See Comments)    Myopathy - elevated CK  . Sulfonamide Derivatives Other (See Comments)    Patient has sickle cell trait  . Latex Rash    Patient Measurements: Height: 5' 7"  (170.2 cm) Weight: 195 lb 8.8 oz (88.7 kg) IBW/kg (Calculated) : 61.6 Heparin Dosing Weight: 80kg  Vital Signs: Temp: 98.4 F (36.9 C) (06/27 0909) Temp src: Oral (06/27 0909) BP: 91/52 mmHg (06/27 0909) Pulse Rate: 79 (06/27 0909)  Labs:  Recent Labs  03/31/14 0258 03/31/14 0403 03/31/14 0858 03/31/14 0920  HGB 11.4*  --   --   --   HCT 32.6*  --   --   --   PLT 155  --   --   --   LABPROT  --  13.7  --   --   INR  --  1.05  --   --   HEPARINUNFRC  --   --   --  0.38  CREATININE 1.00  --   --   --   TROPONINI <0.30  --  <0.30  --     Estimated Creatinine Clearance: 67.5 ml/min (by C-G formula based on Cr of 1).  PTA meds revieved  Assessment: Patient is a 63 y.o. female presenting with chest pain, dyspnea, nausea, diaphoresis. She has been having similar pains but milder over the last several weeks. She has been under a lot of stress with the recent death of her spouse. She does have cardiac risk factors of hypertension and hyperlipidemia. She is a nonsmoker and no history of diabetes. Aspirin was not given because of allergy per MD.   Initial heparin level was at goal on 950 units/hr. Baseline INR normal, hgb 13.7 this morning. Plt 155. No bleeding issues have been noted.  Goal of Therapy:  Heparin level 0.3-0.7 units/ml Monitor platelets by anticoagulation protocol: Yes   Plan:  Continue heparin infusion at 950 units/hr Check confirmatory anti-Xa level in 6 hours and daily while on heparin Continue to monitor H&H and platelets  Erin Hearing PharmD., BCPS Clinical Pharmacist Pager [phone] 03/31/2014 11:32 AM

## 2014-04-01 ENCOUNTER — Encounter (HOSPITAL_COMMUNITY): Payer: Self-pay | Admitting: Nurse Practitioner

## 2014-04-01 LAB — CBC
HEMATOCRIT: 32.9 % — AB (ref 36.0–46.0)
HEMOGLOBIN: 11.2 g/dL — AB (ref 12.0–15.0)
MCH: 33 pg (ref 26.0–34.0)
MCHC: 34 g/dL (ref 30.0–36.0)
MCV: 97.1 fL (ref 78.0–100.0)
Platelets: 139 10*3/uL — ABNORMAL LOW (ref 150–400)
RBC: 3.39 MIL/uL — ABNORMAL LOW (ref 3.87–5.11)
RDW: 12.5 % (ref 11.5–15.5)
WBC: 4.3 10*3/uL (ref 4.0–10.5)

## 2014-04-01 LAB — HEPARIN LEVEL (UNFRACTIONATED): Heparin Unfractionated: 0.63 IU/mL (ref 0.30–0.70)

## 2014-04-01 NOTE — Discharge Summary (Signed)
Discharge Summary   Patient ID: [name],  MRN: [id number], DOB/AGE: [birth year] 63 y.o.  Admit date: 03/31/2014 Discharge date: 04/01/2014  Primary Care Provider: [name] Primary Cardiologist: C. Hilty, MD   Discharge Diagnoses Principal Problem:   Left sided chest pain  **No objective evidence of ischemia.  Active Problems:   Anxiety   Grief reaction   Hyperlipidemia   Asthma   Essential hypertension, benign   Allergies Allergies  Allergen Reactions  . Adhesive [Tape] Other (See Comments)    Blisters skin  . Aspirin Other (See Comments)    sickle cell trait  . Statins Other (See Comments)    Myopathy - elevated CK  . Sulfonamide Derivatives Other (See Comments)    Patient has sickle cell trait  . Latex Rash   Procedures  None  History of Present Illness  63 y/o female with the above problem list. She has a long h/o intermittent chest pain and palpitations in the setting of anxiety. She had nl cors on a cath in 2001 and has since had two nl myoviews - 2009, 2012. She was last seen in clinic by Dr. Debara Pickett in 02/2013. She says that she's missed several appts 2/2 having to take care of her husband, who had been battling advanced prostate cancer and recently died on 03/31/23.   Over the past 4 mos, she has been experiencing intermittent, almost daily, rest and exertional left sided, band-like chest discomfort, which radiates to her neck, is associated with dyspnea, and often resolves within 5-10 mins after taking alprazolam (she chews them so that they'll work faster). At one point in May, she had a particularly bad episode of c/p that awoke her from sleep and was associated with marked nausea and diaphoresis. That episode lasted about thirty minutes and resolved after alprazolam. Since then, her pain has continued intermittently as above but on the morning of admission, she awoke @ about 2:30am with left sided chest pain - like she was laying on a rock, which  radiated to her neck and was assoc with sob. Pain was somewhat worse with palpation. She called her son and after their discussion, she called EMS and was taken to [facility]. There, ECG showed anterior t changes - similar to prior ECG's. Trop was nl. She was placed on heparin and IV ntg with some improvement in pain but not complete relief. She was transferred to [facility] for further eval.   Hospital Course  Patient continued to have intermittent left-sided chest pain and tenderness throughout admission. Despite this, there was no objective evidence of ischemia and cardiac markers remain normal. It was felt that the most likely cause of her symptoms, as it has been in the past, was anxiety and more particularly grief in the setting of her husband's recent passing. She'll be discharged home today in good condition. We have recommended outpatient followup with her primary care provider and consideration of grief counseling. We will also arrange for followup in cardiology clinic within the next few weeks at which point we will consider outpatient stress testing though in the absence of objective evidence of ischemia, it is not clear that that is indicated at this time.  Discharge Vitals Blood pressure 99/62, pulse 69, temperature 98.1 F (36.7 C), temperature source Oral, resp. rate 16, height 5' 7"  (1.702 m), weight 195 lb 8.8 oz (88.7 kg), SpO2 98.00%.  Filed Weights   03/31/14 0250 03/31/14 0826 03/31/14 0909  Weight: 180 lb (81.647 kg) 195 lb 8.8 oz (  88.7 kg) 195 lb 8.8 oz (88.7 kg)    Labs  CBC  Recent Labs  03/31/14 0258 04/01/14 0258  WBC 3.9* 4.3  HGB 11.4* 11.2*  HCT 32.6* 32.9*  MCV 96.2 97.1  PLT 155 419*   Basic Metabolic Panel  Recent Labs  03/31/14 0258  NA 143  K 3.6*  CL 106  CO2 21  GLUCOSE 142*  BUN 14  CREATININE 1.00  CALCIUM 9.4   Cardiac Enzymes  Recent Labs  03/31/14 0858 03/31/14 1358 03/31/14 2055  TROPONINI <0.30 <0.30 <0.30   Thyroid Function  Tests  Recent Labs  03/31/14 0858  TSH 6.160*   Disposition  Pt is being discharged home today in good condition.  Follow-up Plans & Appointments  Follow-up Information   Follow up with [name], MD. (1-2 wks.)    Specialty:  Family Medicine   Contact information:   [address] [place] [address] [phone]       Follow up with Pixie Casino, MD. (we will arrange for f/u and contact you.)    Specialty:  Cardiology   Contact information:   [address] [place] [address] [phone]       Discharge Medications    Medication List         albuterol 108 (90 BASE) MCG/ACT inhaler  Commonly known as:  PROVENTIL HFA;VENTOLIN HFA  Inhale 2 puffs into the lungs every 4 (four) hours as needed for wheezing.     ALPRAZolam 1 MG tablet  Commonly known as:  XANAX  Take 1 tablet (1 mg total) by mouth 2 (two) times daily as needed for anxiety.     antipyrine-benzocaine otic solution  Commonly known as:  AURALGAN  Place 3-4 drops into the right ear every 2 (two) hours as needed for ear pain.     brimonidine 0.2 % ophthalmic solution  Commonly known as:  ALPHAGAN  Place 1 drop into both eyes 2 (two) times daily.     chlorpheniramine-HYDROcodone 10-8 MG/5ML Lqcr  Commonly known as:  TUSSIONEX PENNKINETIC ER  Take 5 mLs by mouth every 12 (twelve) hours as needed for cough.     fenofibrate 145 MG tablet  Commonly known as:  TRICOR  Take 1 tablet (145 mg total) by mouth daily.     hydrochlorothiazide 12.5 MG tablet  Commonly known as:  HYDRODIURIL  Take 1 tablet (12.5 mg total) by mouth daily.     hydrocortisone 25 MG suppository  Commonly known as:  ANUSOL-HC  Place 1 suppository (25 mg total) rectally 2 (two) times daily as needed for hemorrhoids. For 12 days     irbesartan 150 MG tablet  Commonly known as:  AVAPRO  Take 1 tablet (150 mg total) by mouth at bedtime.     latanoprost 0.005 % ophthalmic solution  Commonly  known as:  XALATAN  Place 1 drop into both eyes at bedtime.     loratadine 10 MG tablet  Commonly known as:  CLARITIN  Take 10 mg by mouth daily as needed for allergies.     MAGNESIUM PO  Take 2 tablets by mouth daily.     nitroGLYCERIN 0.4 MG/SPRAY spray  Commonly known as:  NITROLINGUAL  Place 1 spray under the tongue every 5 (five) minutes as needed for chest pain.     zolpidem 10 MG tablet  Commonly known as:  AMBIEN  Take 1 tablet (10 mg total) by mouth at bedtime. For sleep  Outstanding Labs/Studies  None  Duration of Discharge Encounter   Greater than 30 minutes including physician time.  Signed, Murray Hodgkins NP 04/01/2014, 8:49 AM

## 2014-04-01 NOTE — Progress Notes (Signed)
Utilization Review Completed.[name] T6/28/2015  

## 2014-04-01 NOTE — Progress Notes (Signed)
[place] for Heparin Indication: chest pain/ACS  Allergies  Allergen Reactions  . Adhesive [Tape] Other (See Comments)    Blisters skin  . Aspirin Other (See Comments)    sickle cell trait  . Statins Other (See Comments)    Myopathy - elevated CK  . Sulfonamide Derivatives Other (See Comments)    Patient has sickle cell trait  . Latex Rash    Patient Measurements: Height: 5' 7"  (170.2 cm) Weight: 195 lb 8.8 oz (88.7 kg) IBW/kg (Calculated) : 61.6 Heparin Dosing Weight: 80kg  Vital Signs: Temp: 98.6 F (37 C) (06/28 0322) Temp src: Oral (06/28 0322) BP: 91/49 mmHg (06/28 0322) Pulse Rate: 69 (06/28 0322)  Labs:  Recent Labs  03/31/14 0258 03/31/14 0403 03/31/14 0858 03/31/14 0920 03/31/14 1358 03/31/14 2055 03/31/14 2140 04/01/14 0258  HGB 11.4*  --   --   --   --   --   --  11.2*  HCT 32.6*  --   --   --   --   --   --  32.9*  PLT 155  --   --   --   --   --   --  139*  LABPROT  --  13.7  --   --   --   --   --   --   INR  --  1.05  --   --   --   --   --   --   HEPARINUNFRC  --   --   --  0.38  --   --  0.20* 0.63  CREATININE 1.00  --   --   --   --   --   --   --   TROPONINI <0.30  --  <0.30  --  <0.30 <0.30  --   --     Estimated Creatinine Clearance: 67.5 ml/min (by C-G formula based on Cr of 1).   Assessment: Patient is a 63 y.o. female presenting with chest pain, dyspnea, nausea, diaphoresis. She has been having similar pains but milder over the last several weeks. She does have cardiac risk factors of hypertension and hyperlipidemia. She is a nonsmoker and no history of diabetes.HL is 0.63 after rate increase. Other labs as above.   Goal of Therapy:  Heparin level 0.3-0.7 units/ml Monitor platelets by anticoagulation protocol: Yes   Plan:  -Continue heparin infusion at 1200 units/hr -1200 HL to confirm -Daily CBC/HL -Monitor for bleeding  Thank you for allowing me to take part in this patient's  care,  Narda Bonds, PharmD Clinical Pharmacist Phone: [phone] Pager: [phone] 04/01/2014 4:38 AM

## 2014-04-01 NOTE — Discharge Summary (Signed)
See my note for further details.

## 2014-04-01 NOTE — Discharge Instructions (Signed)
***  PLEASE REMEMBER TO BRING ALL OF YOUR MEDICATIONS TO EACH OF YOUR FOLLOW-UP OFFICE VISITS.  

## 2014-04-01 NOTE — Progress Notes (Signed)
SUBJECTIVE: Patient has had intermittent chest pains with normal troponins, and continues to grieve over her husband's passing.     Intake/Output Summary (Last 24 hours) at 04/01/14 0836 Last data filed at 04/01/14 0700  Gross per 24 hour  Intake  718.5 ml  Output   1401 ml  Net -682.5 ml    Current Facility-Administered Medications  Medication Dose Route Frequency Provider Last Rate Last Dose  . acetaminophen (TYLENOL) tablet 650 mg  650 mg Oral Q4H PRN Rogelia Mire, NP      . albuterol (PROVENTIL) (2.5 MG/3ML) 0.083% nebulizer solution 3 mL  3 mL Inhalation Q4H PRN Rogelia Mire, NP      . ALPRAZolam Duanne Moron) tablet 1 mg  1 mg Oral TID Rogelia Mire, NP   1 mg at 03/31/14 2236  . alum & mag hydroxide-simeth (MAALOX/MYLANTA) 200-200-20 MG/5ML suspension 30 mL  30 mL Oral Q4H PRN Rogelia Mire, NP      . antipyrine-benzocaine Toniann Fail) otic solution 3-4 drop  3-4 drop Right Ear Q2H PRN Rogelia Mire, NP      . brimonidine (ALPHAGAN) 0.2 % ophthalmic solution 1 drop  1 drop Both Eyes BID Rogelia Mire, NP   1 drop at 03/31/14 2237  . fenofibrate tablet 160 mg  160 mg Oral Daily Rogelia Mire, NP   160 mg at 03/31/14 2253  . fluticasone (FLOVENT HFA) 110 MCG/ACT inhaler 2 puff  2 puff Inhalation BID Rogelia Mire, NP   2 puff at 03/31/14 1952  . heparin ADULT infusion 100 units/mL (25000 units/250 mL)  1,200 Units/hr Intravenous Continuous Herminio Commons, MD 12 mL/hr at 04/01/14 0700 1,200 Units at 04/01/14 0700  . hydrochlorothiazide (MICROZIDE) capsule 12.5 mg  12.5 mg Oral Daily Rogelia Mire, NP      . hydrocortisone (ANUSOL-HC) suppository 25 mg  25 mg Rectal BID PRN Rogelia Mire, NP      . irbesartan (AVAPRO) tablet 150 mg  150 mg Oral QHS Rogelia Mire, NP   150 mg at 03/31/14 2237  . latanoprost (XALATAN) 0.005 % ophthalmic solution 1 drop  1 drop Both Eyes QHS Rogelia Mire, NP   1 drop at  03/31/14 2238  . loratadine (CLARITIN) tablet 10 mg  10 mg Oral Daily PRN Rogelia Mire, NP      . morphine 2 MG/ML injection 2 mg  2 mg Intravenous Q2H PRN Rogelia Mire, NP   2 mg at 04/01/14 0146  . nitroGLYCERIN (NITROSTAT) SL tablet 0.4 mg  0.4 mg Sublingual Q5 Min x 3 PRN Rogelia Mire, NP      . ondansetron [facility]) injection 4 mg  4 mg Intravenous Q6H PRN Rogelia Mire, NP      . pantoprazole (PROTONIX) EC tablet 40 mg  40 mg Oral Q0600 Rogelia Mire, NP   40 mg at 04/01/14 0742  . polyethylene glycol (MIRALAX / GLYCOLAX) packet 17 g  17 g Oral QHS Lamar Sprinkles, MD   17 g at 03/31/14 2236  . zolpidem (AMBIEN) tablet 5 mg  5 mg Oral QHS Rogelia Mire, NP   5 mg at 03/31/14 2236    Filed Vitals:   03/31/14 2300 04/01/14 0147 04/01/14 0322 04/01/14 0700  BP: 108/65 113/57 91/49 99/62   Pulse: 76 79 69 69  Temp: 99.6 F (37.6 C)  98.6 F (37 C) 98.1 F (36.7 C)  TempSrc: Axillary  Oral Oral  Resp: 17 14 13 16   Height:      Weight:      SpO2: 97% 98% 93% 98%    PHYSICAL EXAM General: NAD Neck: No JVD, no thyromegaly.  Lungs: Clear to auscultation bilaterally with normal respiratory effort. CV: Nondisplaced PMI.  Regular rate and rhythm, normal S1/S2, no S3/S4, no murmur.  No pretibial edema.  No carotid bruit.  Normal pedal pulses.  Abdomen: Soft, nontender, no hepatosplenomegaly, no distention.  Neurologic: Alert and oriented x 3.  Psych: Normal affect. Extremities: No clubbing or cyanosis.   TELEMETRY: Reviewed telemetry pt in sinus rhythm.  LABS: Basic Metabolic Panel:  Recent Labs  03/31/14 0258  NA 143  K 3.6*  CL 106  CO2 21  GLUCOSE 142*  BUN 14  CREATININE 1.00  CALCIUM 9.4   Liver Function Tests: No results found for this basename: AST, ALT, ALKPHOS, BILITOT, PROT, ALBUMIN,  in the last 72 hours No results found for this basename: LIPASE, AMYLASE,  in the last 72 hours CBC:  Recent Labs  03/31/14 0258  04/01/14 0258  WBC 3.9* 4.3  HGB 11.4* 11.2*  HCT 32.6* 32.9*  MCV 96.2 97.1  PLT 155 139*   Cardiac Enzymes:  Recent Labs  03/31/14 0858 03/31/14 1358 03/31/14 2055  TROPONINI <0.30 <0.30 <0.30   BNP: No components found with this basename: POCBNP,  D-Dimer: No results found for this basename: DDIMER,  in the last 72 hours Hemoglobin A1C: No results found for this basename: HGBA1C,  in the last 72 hours Fasting Lipid Panel: No results found for this basename: CHOL, HDL, LDLCALC, TRIG, CHOLHDL, LDLDIRECT,  in the last 72 hours Thyroid Function Tests:  Recent Labs  03/31/14 0858  TSH 6.160*   Anemia Panel: No results found for this basename: VITAMINB12, FOLATE, FERRITIN, TIBC, IRON, RETICCTPCT,  in the last 72 hours  RADIOLOGY: Dg Chest Port 1 View  03/31/2014   CLINICAL DATA:  CHEST PAIN  EXAM: PORTABLE CHEST - 1 VIEW  COMPARISON:  Prior CT from 11/27/2013 and radiograph from 09/20/2012  FINDINGS: The cardiac and mediastinal silhouettes are stable in size and contour, and remain within normal limits.  Lungs are mildly hypoinflated. Mild mild bronchitic changes present, similar to prior. No airspace consolidation, pleural effusion, or pulmonary edema is identified. There is no pneumothorax.  No acute osseous abnormality identified.  IMPRESSION: No acute cardiopulmonary abnormality.   Electronically Signed   By: Jeannine Boga M.D.   On: 03/31/2014 04:06      ASSESSMENT AND PLAN: 1. Chest pain: Noncardiac in etiology, as patient is experiencing normal grief response to husband's recent passing. She is currently receiving counseling. I recommend she follow up with Dr. Debara Pickett and he can pursue an outpatient stress test if deemed necessary. Pt is in agreement with this plan. Of note, she has a history significant for anxiety with normal nuclear perfusion studies in the past.  Dispo: Will discharge to home today.   Kate Sable, M.D., F.A.C.C.

## 2014-04-26 ENCOUNTER — Encounter: Payer: Self-pay | Admitting: Cardiology

## 2014-04-26 ENCOUNTER — Ambulatory Visit (INDEPENDENT_AMBULATORY_CARE_PROVIDER_SITE_OTHER): Admitting: Cardiology

## 2014-04-26 VITALS — BP 156/75 | HR 71 | Ht 67.0 in | Wt 190.0 lb

## 2014-04-26 DIAGNOSIS — R9431 Abnormal electrocardiogram [ECG] [EKG]: Secondary | ICD-10-CM

## 2014-04-26 DIAGNOSIS — E785 Hyperlipidemia, unspecified: Secondary | ICD-10-CM

## 2014-04-26 DIAGNOSIS — F4321 Adjustment disorder with depressed mood: Secondary | ICD-10-CM

## 2014-04-26 DIAGNOSIS — R079 Chest pain, unspecified: Secondary | ICD-10-CM

## 2014-04-26 DIAGNOSIS — I10 Essential (primary) hypertension: Secondary | ICD-10-CM

## 2014-04-26 NOTE — Assessment & Plan Note (Signed)
May be related to grief but continues with episodes of chest pain, one that awakened her from sleep and sent to hospital.  Neg Troponin.  Will do exercise myoview to eval for ischemia.  If negative she will follow up in 4 months if + we will call and arrange for further eval.
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
## 2014-04-26 NOTE — Assessment & Plan Note (Signed)
Emotional at times, but normal grief process.

## 2014-04-26 NOTE — Assessment & Plan Note (Signed)
Mildly elevated today.

## 2014-04-26 NOTE — Patient Instructions (Signed)
Your physician has requested that you have en exercise stress myoview Friday, Monday or Tuesday. For further information please visit [email or web address]. Please follow instruction sheet, as given.  We will call you with the results.  Your physician recommends that you schedule a follow-up appointment in: 4 months with Dr. Debara Pickett.

## 2014-04-26 NOTE — Assessment & Plan Note (Signed)
stable °

## 2014-04-27 ENCOUNTER — Telehealth (HOSPITAL_COMMUNITY): Payer: Self-pay

## 2014-04-27 NOTE — Telephone Encounter (Signed)
Encounter complete. 

## 2014-05-01 ENCOUNTER — Encounter (HOSPITAL_COMMUNITY)

## 2014-05-11 ENCOUNTER — Ambulatory Visit: Admitting: Cardiology

## 2014-05-15 ENCOUNTER — Ambulatory Visit: Admitting: Family Medicine

## 2014-07-16 ENCOUNTER — Telehealth: Payer: Self-pay | Admitting: Family Medicine

## 2014-07-16 DIAGNOSIS — I10 Essential (primary) hypertension: Secondary | ICD-10-CM

## 2014-07-16 MED ORDER — LATANOPROST 0.005 % OP SOLN: 1.0000 [drp] | Freq: Every day | OPHTHALMIC | Status: DC

## 2014-07-16 MED ORDER — HYDROCHLOROTHIAZIDE 12.5 MG PO TABS: 12.5000 mg | ORAL_TABLET | Freq: Every day | ORAL | Status: DC

## 2014-07-16 MED ORDER — BRIMONIDINE TARTRATE 0.2 % OP SOLN: 1.0000 [drp] | Freq: Two times a day (BID) | OPHTHALMIC | Status: DC

## 2014-07-16 MED ORDER — IRBESARTAN 150 MG PO TABS: 150.0000 mg | ORAL_TABLET | Freq: Every day | ORAL | Status: DC

## 2014-07-16 NOTE — Telephone Encounter (Signed)
Patient is calling to get refills on her latanoprost, brimonidine, hydrochlorothiazide, and irbesartan if possible to go to the [facility]  If any questions call [phone]

## 2014-07-16 NOTE — Telephone Encounter (Signed)
Prescription sent to pharmacy.

## 2014-07-20 ENCOUNTER — Ambulatory Visit: Admitting: Family Medicine

## 2014-08-03 ENCOUNTER — Ambulatory Visit (INDEPENDENT_AMBULATORY_CARE_PROVIDER_SITE_OTHER): Admitting: Family Medicine

## 2014-08-03 ENCOUNTER — Encounter: Payer: Self-pay | Admitting: Family Medicine

## 2014-08-03 VITALS — BP 104/70 | HR 76 | Temp 97.9°F | Resp 18 | Wt 196.0 lb

## 2014-08-03 DIAGNOSIS — R946 Abnormal results of thyroid function studies: Secondary | ICD-10-CM

## 2014-08-03 DIAGNOSIS — F419 Anxiety disorder, unspecified: Secondary | ICD-10-CM

## 2014-08-03 DIAGNOSIS — F5104 Psychophysiologic insomnia: Secondary | ICD-10-CM

## 2014-08-03 DIAGNOSIS — G4700 Insomnia, unspecified: Secondary | ICD-10-CM

## 2014-08-03 DIAGNOSIS — E785 Hyperlipidemia, unspecified: Secondary | ICD-10-CM

## 2014-08-03 DIAGNOSIS — N182 Chronic kidney disease, stage 2 (mild): Secondary | ICD-10-CM

## 2014-08-03 DIAGNOSIS — Z23 Encounter for immunization: Secondary | ICD-10-CM

## 2014-08-03 DIAGNOSIS — I10 Essential (primary) hypertension: Secondary | ICD-10-CM

## 2014-08-03 DIAGNOSIS — J4520 Mild intermittent asthma, uncomplicated: Secondary | ICD-10-CM

## 2014-08-03 DIAGNOSIS — R7989 Other specified abnormal findings of blood chemistry: Secondary | ICD-10-CM

## 2014-08-03 DIAGNOSIS — R7301 Impaired fasting glucose: Secondary | ICD-10-CM

## 2014-08-03 LAB — CBC WITH DIFFERENTIAL/PLATELET
Basophils Absolute: 0.1 10*3/uL (ref 0.0–0.1)
Basophils Relative: 1 % (ref 0–1)
EOS ABS: 0.3 10*3/uL (ref 0.0–0.7)
EOS PCT: 6 % — AB (ref 0–5)
HEMATOCRIT: 34.2 % — AB (ref 36.0–46.0)
Hemoglobin: 11.8 g/dL — ABNORMAL LOW (ref 12.0–15.0)
LYMPHS ABS: 1.8 10*3/uL (ref 0.7–4.0)
Lymphocytes Relative: 33 % (ref 12–46)
MCH: 32.7 pg (ref 26.0–34.0)
MCHC: 34.5 g/dL (ref 30.0–36.0)
MCV: 94.7 fL (ref 78.0–100.0)
MONO ABS: 0.6 10*3/uL (ref 0.1–1.0)
Monocytes Relative: 11 % (ref 3–12)
Neutro Abs: 2.7 10*3/uL (ref 1.7–7.7)
Neutrophils Relative %: 49 % (ref 43–77)
PLATELETS: 191 10*3/uL (ref 150–400)
RBC: 3.61 MIL/uL — AB (ref 3.87–5.11)
RDW: 12.8 % (ref 11.5–15.5)
WBC: 5.5 10*3/uL (ref 4.0–10.5)

## 2014-08-03 LAB — COMPREHENSIVE METABOLIC PANEL
ALT: 24 U/L (ref 0–35)
AST: 32 U/L (ref 0–37)
Albumin: 4.3 g/dL (ref 3.5–5.2)
Alkaline Phosphatase: 42 U/L (ref 39–117)
BILIRUBIN TOTAL: 0.8 mg/dL (ref 0.2–1.2)
BUN: 15 mg/dL (ref 6–23)
CALCIUM: 9.1 mg/dL (ref 8.4–10.5)
CO2: 22 meq/L (ref 19–32)
CREATININE: 0.96 mg/dL (ref 0.50–1.10)
Chloride: 103 mEq/L (ref 96–112)
GLUCOSE: 100 mg/dL — AB (ref 70–99)
Potassium: 4.2 mEq/L (ref 3.5–5.3)
Sodium: 137 mEq/L (ref 135–145)
Total Protein: 6.6 g/dL (ref 6.0–8.3)

## 2014-08-03 LAB — LIPID PANEL
CHOL/HDL RATIO: 4.6 ratio
Cholesterol: 176 mg/dL (ref 0–200)
HDL: 38 mg/dL — AB (ref >39)
LDL Cholesterol: 87 mg/dL (ref 0–99)
TRIGLYCERIDES: 255 mg/dL — AB (ref <150)
VLDL: 51 mg/dL — AB (ref 0–40)

## 2014-08-03 LAB — T3, FREE: T3, Free: 2.5 pg/mL (ref 2.3–4.2)

## 2014-08-03 LAB — T4, FREE: Free T4: 1.19 ng/dL (ref 0.80–1.80)

## 2014-08-03 LAB — TSH: TSH: 2.917 u[IU]/mL (ref 0.350–4.500)

## 2014-08-03 LAB — HEMOGLOBIN A1C
Hgb A1c MFr Bld: 4.9 % (ref <5.7)
Mean Plasma Glucose: 94 mg/dL (ref <117)

## 2014-08-03 MED ORDER — TEMAZEPAM 15 MG PO CAPS: 15.0000 mg | ORAL_CAPSULE | Freq: Every evening | ORAL | Status: DC | PRN

## 2014-08-03 NOTE — Progress Notes (Signed)
Patient ID: [name], female   DOB: Oct 14, 1950, 63 y.o.   MRN: [id number]   Subjective:    Patient ID: [name], female    DOB: 1951-08-24, 63 y.o.   MRN: [id number]  Patient presents for 4 mth check up  patient here to follow her chronic medical problems. She's returned home she was with her son in [place] for the past 2 months since her husband died. She is being followed by the hospice team getting therapy since his death. She continues to have difficulty sleeping and the Ambien is not helping.  Hypertension she is not taking any blood pressure medicines and greater than 1 week. Is them on her blood pressure miss been good without medication.  Asthma she took herself off of the Advair which she went to [place] she only needed to use her albuterol inhaler one time. She is due for a pneumonia as well as a tetanus and flu shot  Medications were reviewed    Review Of Systems:  GEN- denies fatigue, fever, weight loss,weakness, recent illness HEENT- denies eye drainage, change in vision, nasal discharge, CVS- denies chest pain, palpitations RESP- denies SOB, cough, wheeze ABD- denies N/V, change in stools, abd pain GU- denies dysuria, hematuria, dribbling, incontinence MSK- denies joint pain, muscle aches, injury Neuro- denies headache, dizziness, syncope, seizure activity       Objective:    BP 104/70  Pulse 76  Temp(Src) 97.9 F (36.6 C) (Oral)  Resp 18  Wt 196 lb (88.905 kg) GEN- NAD, alert and oriented x3 HEENT- PERRL, EOMI, non injected sclera, pink conjunctiva, MMM, oropharynx clear Neck- Supple, no thyromegaly CVS- RRR, no murmur RESP-CTAB Psych- depressed affect, not anxious appearing, no SI, normal speech, well groomed EXT- No edema Pulses- Radial 2+        Assessment & Plan:      Problem List Items Addressed This Visit   Hyperlipidemia   Relevant Orders      Lipid panel (Completed)   Essential hypertension, benign   Relevant Orders      CBC with Differential (Completed)      Comprehensive metabolic panel (Completed)   CKD (chronic kidney disease), stage II   Relevant Orders      Comprehensive metabolic panel (Completed)   Asthma   Anxiety - Primary    Other Visit Diagnoses   Elevated fasting glucose        Relevant Orders       Hemoglobin A1c (Completed)    Elevated TSH        Relevant Orders       TSH (Completed)       T3, free (Completed)       T4, free (Completed)    Need for prophylactic vaccination against Streptococcus pneumoniae (pneumococcus) and influenza        Relevant Orders       Pneumococcal polysaccharide vaccine 23-valent greater than or equal to 2yo subcutaneous/IM (Completed)       Flu Vaccine QUAD 36+ mos PF IM (Fluarix Quad PF) (Completed)    Need for prophylactic vaccination with combined diphtheria-tetanus-pertussis (DTP) vaccine        Relevant Orders       Tdap vaccine greater than or equal to 7yo IM (Completed)       Note: This dictation was prepared with Dragon dictation along with smaller phrase technology. Any transcriptional errors that result from this process are unintentional.

## 2014-08-03 NOTE — Assessment & Plan Note (Signed)
We'll try her on temazepam at bedtime

## 2014-08-03 NOTE — Assessment & Plan Note (Signed)
Will monitor BP off medications

## 2014-08-03 NOTE — Assessment & Plan Note (Signed)
She does have her Advair at home she is instructed to start this if she starts having difficulties this winter. I've given her pneumonia vaccine as well as a tetanus with pertussis

## 2014-08-03 NOTE — Patient Instructions (Signed)
Stop the blood pressure medications Stop the ambien  Try the temazepam Pneumonia vaccine given TDAP and flu shot given We will call with lab results F/U 3 months

## 2014-08-03 NOTE — Assessment & Plan Note (Signed)
Continue xanax at current dose

## 2014-08-03 NOTE — Assessment & Plan Note (Signed)
Repeat lipids on tricor

## 2014-08-08 ENCOUNTER — Telehealth: Payer: Self-pay | Admitting: Gastroenterology

## 2014-08-08 NOTE — Telephone Encounter (Signed)
I called pt. She is very concerned about something in our records causing her to be denied [place]. She had a good report from her PCP recently. She is not sure what it could be in our records. She lost her husband in June and she had been under a lot of stress before that. She said she is willing to come in for an OV if she needs to. She is aware that Rosendo Gros will be back on Mon and will advise how to handle this.

## 2014-08-08 NOTE — Telephone Encounter (Signed)
Pt called today saying that we had papers from [facility] for her [place]. This was processed by [facility] with [facility] on 07/13/2014. Patient said that she had been denied and wanted to know why because she had just seen her PCP today and everything was fine. Patient is frustrated and wants to speak to someone about this. I do not know how to answer her questions regarding the medical records that was processed and how they came up with their conclusion to deny patient. Please advise and call her ASAP [phone]

## 2014-08-13 ENCOUNTER — Telehealth: Payer: Self-pay | Admitting: Gastroenterology

## 2014-08-13 NOTE — Telephone Encounter (Signed)
See phone note from 08/08/14

## 2014-08-13 NOTE — Telephone Encounter (Signed)
I spoke with Dr. Oneida Alar and she said offer the patient the first non-urgent appt.  Appt. Made 09/13/14 at 9:45 am.  Patient is aware of appt

## 2014-08-13 NOTE — Telephone Encounter (Signed)
Patient called to speak with CM about her [employer] insurance denying her due to what's in her medical records from [place] and needs it straightened out so she can get her insurance approved again. The papers are in CM office and patient is expecting a call back from CM at [phone]

## 2014-08-13 NOTE — Telephone Encounter (Signed)
I explained to the patient that we need to know what dx that was listed for her denial, however we can't remove anything from a chart that is listed from before.  She would like to make an appt with Dr. Oneida Alar for follow-up.

## 2014-08-17 ENCOUNTER — Ambulatory Visit: Admitting: Family Medicine

## 2014-08-22 ENCOUNTER — Telehealth: Payer: Self-pay | Admitting: Family Medicine

## 2014-08-22 MED ORDER — ZOLPIDEM TARTRATE 10 MG PO TABS: 10.0000 mg | ORAL_TABLET | Freq: Every evening | ORAL | Status: DC | PRN

## 2014-08-22 NOTE — Telephone Encounter (Signed)
[phone]   Pt is needing to speak to you about switching back to her [name] because the sleep medication that she was just given is not working

## 2014-08-22 NOTE — Telephone Encounter (Signed)
Medication called to pharmacy.  Refill sent to [facility].

## 2014-08-22 NOTE — Telephone Encounter (Signed)
Call placed to patient.   Reports that she was seen on 08/03/2014. States that she discussed changing insomnia medication since she was waking up throughout the night. Reports that she was given Restoril. States that she has been taking Restoril QHS, but is not noticing any effectiveness. States that she has period of being "out of it" after taking Restoril, but is not able to sleep.   Requested to resume Ambien since she knows that she will be able to go to sleep with medication, but was not opposed to trying new prescription to see if there is more effective medication for her insomnia.   MD please advise.

## 2014-08-22 NOTE — Telephone Encounter (Signed)
Okay to swtich back to Ambien 89m  R 2

## 2014-08-27 ENCOUNTER — Ambulatory Visit: Admitting: Internal Medicine

## 2014-09-06 ENCOUNTER — Telehealth: Payer: Self-pay | Admitting: Family Medicine
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
## 2014-09-06 NOTE — Telephone Encounter (Signed)
[phone]  Pt states that she had some discharge last night and believes it could be start of a bladder infection and would like to have something called in.

## 2014-09-07 MED ORDER — FLUCONAZOLE 150 MG PO TABS: 150.0000 mg | ORAL_TABLET | Freq: Once | ORAL | Status: DC

## 2014-09-07 NOTE — Telephone Encounter (Signed)
Doubt this is a bladder infection, send in Dilfucan x 1 dose if not improved, come in for visit

## 2014-09-07 NOTE — Telephone Encounter (Signed)
Call placed to patient and patient made aware.   Prescription sent to pharmacy.  

## 2014-09-13 ENCOUNTER — Encounter: Payer: Self-pay | Admitting: Gastroenterology

## 2014-09-13 ENCOUNTER — Ambulatory Visit (INDEPENDENT_AMBULATORY_CARE_PROVIDER_SITE_OTHER): Admitting: Gastroenterology

## 2014-09-13 ENCOUNTER — Telehealth: Payer: Self-pay

## 2014-09-13 VITALS — BP 119/76 | HR 87 | Temp 97.7°F | Ht 67.0 in | Wt 198.6 lb

## 2014-09-13 DIAGNOSIS — K219 Gastro-esophageal reflux disease without esophagitis: Secondary | ICD-10-CM

## 2014-09-13 DIAGNOSIS — K589 Irritable bowel syndrome without diarrhea: Secondary | ICD-10-CM

## 2014-09-13 DIAGNOSIS — K760 Fatty (change of) liver, not elsewhere classified: Secondary | ICD-10-CM

## 2014-09-13 NOTE — Assessment & Plan Note (Signed)
SX CONTROLLED.  CONTINUE TO MONITOR SYMPTOMS. FOLLOW UP IN 6 MOS TO 1 YEAR.

## 2014-09-13 NOTE — Patient Instructions (Signed)
CONTINUE YOUR WEIGHT LOSS EFFORTS.  FOLLOW A LOW FAT DIET. SEE INFO BELOW.  CONTINUE PREVACID. TAKE 30 MINUTES PRIOR TO MEALS TWICE DAILY.  FOLLOW UP IN 6 MOS TO 1 YEAR.    Low-Fat Diet BREADS, CEREALS, PASTA, RICE, DRIED PEAS, AND BEANS These products are high in carbohydrates and most are low in fat. Therefore, they can be increased in the diet as substitutes for fatty foods. They too, however, contain calories and should not be eaten in excess. Cereals can be eaten for snacks as well as for breakfast.   FRUITS AND VEGETABLES It is good to eat fruits and vegetables. Besides being sources of fiber, both are rich in vitamins and some minerals. They help you get the daily allowances of these nutrients. Fruits and vegetables can be used for snacks and desserts.  MEATS Limit lean meat, chicken, [place], and fish to no more than 6 ounces per day. Beef, Pork, and Lamb Use lean cuts of beef, pork, and lamb. Lean cuts include:  Extra-lean ground beef.  Arm roast.  Sirloin tip.  Center-cut ham.  Round steak.  Loin chops.  Rump roast.  Tenderloin.  Trim all fat off the outside of meats before cooking. It is not necessary to severely decrease the intake of red meat, but lean choices should be made. Lean meat is rich in protein and contains a highly absorbable form of iron. Premenopausal women, in particular, should avoid reducing lean red meat because this could increase the risk for low red blood cells (iron-deficiency anemia).  Chicken and [place] These are good sources of protein. The fat of poultry can be reduced by removing the skin and underlying fat layers before cooking. Chicken and [place] can be substituted for lean red meat in the diet. Poultry should not be fried or covered with high-fat sauces. Fish and Shellfish Fish is a good source of protein. Shellfish contain cholesterol, but they usually are low in saturated fatty acids. The preparation of fish is important. Like chicken and  [place], they should not be fried or covered with high-fat sauces. EGGS Egg whites contain no fat or cholesterol. They can be eaten often. Try 1 to 2 egg whites instead of whole eggs in recipes or use egg substitutes that do not contain yolk. MILK AND DAIRY PRODUCTS Use skim or 1% milk instead of 2% or whole milk. Decrease whole milk, natural, and processed cheeses. Use nonfat or low-fat (2%) cottage cheese or low-fat cheeses made from vegetable oils. Choose nonfat or low-fat (1 to 2%) yogurt. Experiment with evaporated skim milk in recipes that call for heavy cream. Substitute low-fat yogurt or low-fat cottage cheese for sour cream in dips and salad dressings. Have at least 2 servings of low-fat dairy products, such as 2 glasses of skim (or 1%) milk each day to help get your daily calcium intake. FATS AND OILS Reduce the total intake of fats, especially saturated fat. Butterfat, lard, and beef fats are high in saturated fat and cholesterol. These should be avoided as much as possible. Vegetable fats do not contain cholesterol, but certain vegetable fats, such as coconut oil, palm oil, and palm kernel oil are very high in saturated fats. These should be limited. These fats are often used in bakery goods, processed foods, popcorn, oils, and nondairy creamers. Vegetable shortenings and some peanut butters contain hydrogenated oils, which are also saturated fats. Read the labels on these foods and check for saturated vegetable oils. Unsaturated vegetable oils and fats do not raise blood  cholesterol. However, they should be limited because they are fats and are high in calories. Total fat should still be limited to 30% of your daily caloric intake. Desirable liquid vegetable oils are corn oil, cottonseed oil, olive oil, canola oil, safflower oil, soybean oil, and sunflower oil. Peanut oil is not as good, but small amounts are acceptable. Buy a heart-healthy tub margarine that has no partially hydrogenated oils in  the ingredients. Mayonnaise and salad dressings often are made from unsaturated fats, but they should also be limited because of their high calorie and fat content. Seeds, nuts, peanut butter, olives, and avocados are high in fat, but the fat is mainly the unsaturated type. These foods should be limited mainly to avoid excess calories and fat. OTHER EATING TIPS Snacks  Most sweets should be limited as snacks. They tend to be rich in calories and fats, and their caloric content outweighs their nutritional value. Some good choices in snacks are graham crackers, melba toast, soda crackers, bagels (no egg), English muffins, fruits, and vegetables. These snacks are preferable to snack crackers, [place] fries, TORTILLA CHIPS, and POTATO chips. Popcorn should be air-popped or cooked in small amounts of liquid vegetable oil. Desserts Eat fruit, low-fat yogurt, and fruit ices instead of pastries, cake, and cookies. Sherbet, angel food cake, gelatin dessert, frozen low-fat yogurt, or other frozen products that do not contain saturated fat (pure fruit juice bars, frozen ice pops) are also acceptable.  COOKING METHODS Choose those methods that use little or no fat. They include: Poaching.  Braising.  Steaming.  Grilling.  Baking.  Stir-frying.  Broiling.  Microwaving.  Foods can be cooked in a nonstick pan without added fat, or use a nonfat cooking spray in regular cookware. Limit fried foods and avoid frying in saturated fat. Add moisture to lean meats by using water, broth, cooking wines, and other nonfat or low-fat sauces along with the cooking methods mentioned above. Soups and stews should be chilled after cooking. The fat that forms on top after a few hours in the refrigerator should be skimmed off. When preparing meals, avoid using excess salt. Salt can contribute to raising blood pressure in some people.  EATING AWAY FROM HOME Order entres, potatoes, and vegetables without sauces or butter. When  meat exceeds the size of a deck of cards (3 to 4 ounces), the rest can be taken home for another meal. Choose vegetable or fruit salads and ask for low-calorie salad dressings to be served on the side. Use dressings sparingly. Limit high-fat toppings, such as bacon, crumbled eggs, cheese, sunflower seeds, and olives. Ask for heart-healthy tub margarine instead of butter.

## 2014-09-13 NOTE — Assessment & Plan Note (Signed)
SX CONTROLLED.  PREVACID BID LOSE WEIGHT LOW FAT DIET FOLLOW UP IN 6 MOS-1 YEAR.

## 2014-09-13 NOTE — Progress Notes (Signed)
ON RECALL LIST  °

## 2014-09-13 NOTE — Progress Notes (Signed)
cc'ed to pcp °
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
## 2014-09-13 NOTE — Telephone Encounter (Signed)
Pt left Vm to Lavone Nian for Dr. Oneida Alar to call, [phone] option 2.

## 2014-09-13 NOTE — Assessment & Plan Note (Signed)
NL HFP OCT 2015. WEIGHT DOWN 2-3 LBS SINCE 2014.  CONTINUE YOUR WEIGHT LOSS EFFORTS. LOW FAT DIET FOLLOW UP IN 1 6 MOS-YEAR.

## 2014-09-20 ENCOUNTER — Telehealth: Payer: Self-pay | Admitting: Gastroenterology

## 2014-09-20 NOTE — Telephone Encounter (Signed)
Noted  

## 2014-09-20 NOTE — Telephone Encounter (Signed)
Patient came to front office this morning asking for me to retrieve her medical records from Dr Jannifer Franklin and Dr Merlene Laughter for [facility] to review. She is needing a letter from [facility] to send to Lavone Nian in regards to patient getting her short term disability back. She had been denied due to something in her records while the patient's husband had been sick and has since passed.  She has signed the release of information forms and I have faxed both of those for her. Patient is anxious to get this done and taken care of. I received the records from Dr Jannifer Franklin and [facility], but I am still waiting on Dr Freddie Apley office to forward records to [place]. What I have is in [facility] office.

## 2014-09-24 ENCOUNTER — Ambulatory Visit (INDEPENDENT_AMBULATORY_CARE_PROVIDER_SITE_OTHER): Admitting: Family Medicine

## 2014-09-24 ENCOUNTER — Encounter: Payer: Self-pay | Admitting: Family Medicine

## 2014-09-24 VITALS — BP 123/74 | HR 88 | Temp 98.9°F | Resp 18 | Ht 64.0 in | Wt 208.0 lb

## 2014-09-24 DIAGNOSIS — N644 Mastodynia: Secondary | ICD-10-CM

## 2014-09-24 MED ORDER — DOXYCYCLINE HYCLATE 100 MG PO TABS: 100.0000 mg | ORAL_TABLET | Freq: Two times a day (BID) | ORAL | Status: DC

## 2014-09-24 NOTE — Telephone Encounter (Signed)
I had VM from pt. She said basically what the note below said on 09/20/2014. She just needs Dr. Oneida Alar to review the notes from Dr. Merlene Laughter and Dr. Jannifer Franklin and get the information together for her for [name].  I told her that per Susan's note, we have received the records from Dr. Jannifer Franklin and are waiting for the records from Dr. Freddie Apley office and I will make Dr. Oneida Alar aware.

## 2014-09-24 NOTE — Progress Notes (Signed)
Patient ID: [name], female   DOB: 1951/07/22, 63 y.o.   MRN: [id number]   Subjective:    Patient ID: [name], female    DOB: 02-03-1951, 63 y.o.   MRN: [id number]  Patient presents for L Breast Pain  Left breast pain worsening over the past 10 days or so, has had low grade fever almost daily, highest 100.2 F. Denies cough or congestion, no chest pain, pain in breast wakes her up in middle of night Using sports bra , cool and warm compresses with no improvement  Normal Mammo Feb 2014 Denies any injury to chest wall   Review Of Systems: per above  GEN- denies fatigue, fever, weight loss,weakness, recent illness HEENT- denies eye drainage, change in vision, nasal discharge, CVS- denies chest pain, palpitations RESP- denies SOB, cough, wheeze ABD- denies N/V, change in stools, abd pain GU- denies dysuria, hematuria, dribbling, incontinence MSK- denies joint pain, muscle aches, injury Neuro- denies headache, dizziness, syncope, seizure activity       Objective:    BP 123/74 mmHg  Pulse 88  Temp(Src) 98.9 F (37.2 C) (Oral)  Resp 18  Ht 5' 4"  (1.626 m)  Wt 208 lb (94.348 kg)  BMI 35.69 kg/m2 GEN- NAD, alert and oriented x 3 Breast-  Asymmetry of left breast, swelling around lateral aspect of breast near axilla, no discrete mass, no fluctuance, very TTP, no erythema, no warmth, no nipple inversion,no nipple drainage, no nodules or lumps felt Nodes- no axillary nodes CVS-RRR, no murmur RESP-CTAB       Assessment & Plan:      Problem List Items Addressed This Visit    None    Visit Diagnoses    Breast pain    -  Primary    diagnostic mammogram based on age, worsening pain with fever, afebrile today, start doxy BID awaiting imaging, NSAIDS as tolerated, r/o infection/vs cyst vs inflammtory breast dz    Relevant Orders       MM Digital Diagnostic Unilat L       US BREAST COMPLETE UNI LEFT INC AXILLA       Note: This dictation was prepared with Dragon  dictation along with smaller phrase technology. Any transcriptional errors that result from this process are unintentional.

## 2014-09-24 NOTE — Patient Instructions (Signed)
Take the antibiotics as prescribed Breast imaging to be done F/U as previous

## 2014-09-26 NOTE — Telephone Encounter (Signed)
SPOKE TO [name]. NEEDS LETTER STATING PT DOES NOT HAVE AUTOIMMUNE HEPATITIS. APPEARS THEY ARE WEIGHTING NEUROLOGIC ISSUES  AND HESITANT BASED ON ADDITIONAL GI ISSUES.  WILL FAX LETTER TO [phone] ATN: NEW BUSINESS TO CLARIFY PT DOES NOT HAVE AUTOIMMUNE HEPATITIS.

## 2014-09-26 NOTE — Telephone Encounter (Signed)
REVIEWED-NO ADDITIONAL RECOMMENDATIONS. 

## 2014-10-02 ENCOUNTER — Telehealth: Payer: Self-pay | Admitting: Family Medicine

## 2014-10-02 ENCOUNTER — Ambulatory Visit (HOSPITAL_COMMUNITY)
Admission: RE | Admit: 2014-10-02 | Discharge: 2014-10-02 | Disposition: A | Discharge status: Home / Self Care | Source: Ambulatory Visit | Attending: Family Medicine | Admitting: Family Medicine

## 2014-10-02 DIAGNOSIS — N644 Mastodynia: Secondary | ICD-10-CM

## 2014-10-02 DIAGNOSIS — R05 Cough: Secondary | ICD-10-CM

## 2014-10-02 DIAGNOSIS — R059 Cough, unspecified: Secondary | ICD-10-CM

## 2014-10-02 NOTE — Telephone Encounter (Signed)
At [facility] for Mammogram today due to breast pain.  Is there now.  Has bad cough x one week.  Mammogram was negative.  Xray dept feels pain may be in chest and are wanting OK to do CXR now while there.  Dr Buelah Manis approved CXR, pt told OK, order placed for exam to be done.

## 2014-10-08 ENCOUNTER — Telehealth: Payer: Self-pay | Admitting: Gastroenterology

## 2014-10-08 NOTE — Telephone Encounter (Signed)
Per Dr. Oneida Alar the letter will be faxed to Dr. Evette Doffing on Thursday and she will be able to pick up a copy of the letter on Thursday 10/11/14 after 2 pm.  Routing to [place] to call the patient.

## 2014-10-08 NOTE — Telephone Encounter (Signed)
Pt called to follow up if SF has looked over the records that Dr Merlene Laughter and Dr Jannifer Franklin had faxed over to [place] for her to review. She said that her insurance company ([employer]) is needing something in writing from [facility] saying that patient is fully functional and can take care of herself. Patient said that she was denied her short term disability insurance because of something in her medical records from Dr Merlene Laughter and she is trying to pin point what it is so she can get her insurance approved.  Please call patient on her cell and let her know how things are coming along regarding this. She is very anxious to get this resolved. [phone]  The records are on SF chair in her office.

## 2014-10-08 NOTE — Telephone Encounter (Signed)
REVIEWED-NO ADDITIONAL RECOMMENDATIONS. 

## 2014-10-08 NOTE — Telephone Encounter (Signed)
Information received from Dr. Merlene Laughter and Dr. Jannifer Franklin and is on Dr. Nona Dell chair for review.

## 2014-10-08 NOTE — Telephone Encounter (Signed)
I called pt and she is aware of the letter for Thursday. She said she will still be out of town, but OK to mail her a copy.  She just wants to make sure Dr. Oneida Alar is aware of what she is trying to accomplish.  She is trying to buy long term care insurance for future use when needed, and something was misunderstood in her chart at the other doctors appts when she was having appts when her husband was living. She said she had explained to Dr. Nona Dell, just wanted to update again.

## 2014-10-11 ENCOUNTER — Encounter: Payer: Self-pay | Admitting: General Practice

## 2014-10-11 NOTE — Telephone Encounter (Signed)
LETTER COMPLETED AND WILL BE FAXED TODAY.

## 2014-10-12 ENCOUNTER — Other Ambulatory Visit: Payer: Self-pay | Admitting: Family Medicine

## 2014-10-12 NOTE — Telephone Encounter (Signed)
Per Rosendo Gros, the letter was completed and faxed on 10/11/2014 and copy mailed to pt.

## 2014-10-12 NOTE — Telephone Encounter (Signed)
Medication called to pharmacy. 

## 2014-10-12 NOTE — Telephone Encounter (Signed)
Ok to refill??  Last office visit 09/24/2014.  Last refill 09/01/2014.

## 2014-10-12 NOTE — Telephone Encounter (Signed)
okay

## 2014-10-30 ENCOUNTER — Telehealth: Payer: Self-pay | Admitting: *Deleted

## 2014-10-30 MED ORDER — ZOLPIDEM TARTRATE 10 MG PO TABS: ORAL_TABLET | ORAL | Status: DC

## 2014-10-30 MED ORDER — IRBESARTAN 150 MG PO TABS: 150.0000 mg | ORAL_TABLET | Freq: Every day | ORAL | Status: DC

## 2014-10-30 NOTE — Telephone Encounter (Signed)
Prescription faxed

## 2014-10-30 NOTE — Telephone Encounter (Signed)
Received call from patient.   Requested refills on Irbesartan and Ambien to go to [facility] mail order with 90 day supply.   Ok to refill Ambien through mail order?

## 2014-10-30 NOTE — Telephone Encounter (Signed)
okay

## 2014-11-01 ENCOUNTER — Telehealth: Payer: Self-pay | Admitting: *Deleted

## 2014-11-01 NOTE — Telephone Encounter (Signed)
Pt called back.  They have the Zolpidem at [facility].  Leaving Sunday for cruise.  Mail order will not be delivered by then.  30 day supply received from [facility] was a bad batch.  (she said call Dorothea Ogle at [facility] to confirm, have had to pull all stock)

## 2014-11-01 NOTE — Telephone Encounter (Signed)
Pt called stating that she called her mail order pharmacy to check status of her medication and they had told her that they do not see any meds refilled at this time. I told pt I will call [facility] to see what is going on, I called them and they stated that since it was just called/faxed in on the 10/30/14 that it hasnt been linked into her system yet and it usually take a few days before it links and it should be in system by Monday. I called pt to inform her of this and she stated that she will be leaving to go on a cruise Sunday and that she is needing her ambien before she leaves. Pt wants to know if you can prescribe her enough ambien for 11 days to get her thru called into [place]? Please advise!  [place]

## 2014-11-02 MED ORDER — ZOLPIDEM TARTRATE 10 MG PO TABS: ORAL_TABLET | ORAL | Status: DC

## 2014-11-02 NOTE — Telephone Encounter (Signed)
Do I need to do anything with this? It is fine to send the supply to a local pharmacy

## 2014-11-02 NOTE — Telephone Encounter (Signed)
rx called in and pt aware

## 2014-11-07 ENCOUNTER — Ambulatory Visit: Admitting: Family Medicine

## 2014-11-14 ENCOUNTER — Ambulatory Visit (INDEPENDENT_AMBULATORY_CARE_PROVIDER_SITE_OTHER): Admitting: Family Medicine

## 2014-11-14 ENCOUNTER — Encounter: Payer: Self-pay | Admitting: Family Medicine

## 2014-11-14 ENCOUNTER — Other Ambulatory Visit: Payer: Self-pay | Admitting: Family Medicine

## 2014-11-14 VITALS — BP 118/60 | HR 72 | Temp 98.6°F | Resp 14 | Ht 64.0 in | Wt 209.0 lb

## 2014-11-14 DIAGNOSIS — F419 Anxiety disorder, unspecified: Secondary | ICD-10-CM

## 2014-11-14 DIAGNOSIS — K760 Fatty (change of) liver, not elsewhere classified: Secondary | ICD-10-CM

## 2014-11-14 DIAGNOSIS — N760 Acute vaginitis: Secondary | ICD-10-CM

## 2014-11-14 DIAGNOSIS — E785 Hyperlipidemia, unspecified: Secondary | ICD-10-CM

## 2014-11-14 DIAGNOSIS — Z1231 Encounter for screening mammogram for malignant neoplasm of breast: Secondary | ICD-10-CM

## 2014-11-14 DIAGNOSIS — N952 Postmenopausal atrophic vaginitis: Secondary | ICD-10-CM | POA: Insufficient documentation

## 2014-11-14 DIAGNOSIS — E669 Obesity, unspecified: Secondary | ICD-10-CM

## 2014-11-14 DIAGNOSIS — I10 Essential (primary) hypertension: Secondary | ICD-10-CM

## 2014-11-14 LAB — COMPREHENSIVE METABOLIC PANEL
ALT: 48 U/L — ABNORMAL HIGH (ref 0–35)
AST: 69 U/L — AB (ref 0–37)
Albumin: 4.3 g/dL (ref 3.5–5.2)
Alkaline Phosphatase: 45 U/L (ref 39–117)
BILIRUBIN TOTAL: 0.9 mg/dL (ref 0.2–1.2)
BUN: 20 mg/dL (ref 6–23)
CO2: 24 mEq/L (ref 19–32)
Calcium: 9.5 mg/dL (ref 8.4–10.5)
Chloride: 103 mEq/L (ref 96–112)
Creat: 1.06 mg/dL (ref 0.50–1.10)
GLUCOSE: 101 mg/dL — AB (ref 70–99)
Potassium: 4.2 mEq/L (ref 3.5–5.3)
SODIUM: 139 meq/L (ref 135–145)
Total Protein: 6.6 g/dL (ref 6.0–8.3)

## 2014-11-14 LAB — CBC WITH DIFFERENTIAL/PLATELET
BASOS PCT: 1 % (ref 0–1)
Basophils Absolute: 0.1 10*3/uL (ref 0.0–0.1)
EOS ABS: 0.3 10*3/uL (ref 0.0–0.7)
EOS PCT: 6 % — AB (ref 0–5)
HEMATOCRIT: 35.2 % — AB (ref 36.0–46.0)
HEMOGLOBIN: 12 g/dL (ref 12.0–15.0)
LYMPHS ABS: 1.6 10*3/uL (ref 0.7–4.0)
Lymphocytes Relative: 32 % (ref 12–46)
MCH: 32.5 pg (ref 26.0–34.0)
MCHC: 34.1 g/dL (ref 30.0–36.0)
MCV: 95.4 fL (ref 78.0–100.0)
MPV: 9.7 fL (ref 8.6–12.4)
Monocytes Absolute: 0.4 10*3/uL (ref 0.1–1.0)
Monocytes Relative: 8 % (ref 3–12)
NEUTROS PCT: 53 % (ref 43–77)
Neutro Abs: 2.7 10*3/uL (ref 1.7–7.7)
Platelets: 187 10*3/uL (ref 150–400)
RBC: 3.69 MIL/uL — AB (ref 3.87–5.11)
RDW: 13.6 % (ref 11.5–15.5)
WBC: 5.1 10*3/uL (ref 4.0–10.5)

## 2014-11-14 LAB — LIPID PANEL
CHOL/HDL RATIO: 5.8 ratio
Cholesterol: 167 mg/dL (ref 0–200)
HDL: 29 mg/dL — AB (ref >39)
LDL Cholesterol: 68 mg/dL (ref 0–99)
Triglycerides: 351 mg/dL — ABNORMAL HIGH (ref <150)
VLDL: 70 mg/dL — AB (ref 0–40)

## 2014-11-14 LAB — WET PREP FOR TRICH, YEAST, CLUE
Clue Cells Wet Prep HPF POC: NONE SEEN
TRICH WET PREP: NONE SEEN
YEAST WET PREP: NONE SEEN

## 2014-11-14 MED ORDER — ESTRADIOL 0.1 MG/GM VA CREA: 1.0000 | TOPICAL_CREAM | VAGINAL | Status: DC

## 2014-11-14 MED ORDER — ALPRAZOLAM 1 MG PO TABS: 1.0000 mg | ORAL_TABLET | Freq: Two times a day (BID) | ORAL | Status: DC | PRN

## 2014-11-14 NOTE — Assessment & Plan Note (Signed)
10 pound weight gain we discussed that she is not getting enough calories in we also discussed the types of food she is to eat fruits and vegetables low carbs and water. She will also continue exercising 30 minutes a day 5 days a week in the home.

## 2014-11-14 NOTE — Progress Notes (Signed)
Patient ID: [name], female   DOB: 1950-12-01, 64 y.o.   MRN: [id number]     Subjective:    Patient ID: [name], female    DOB: [birth year], 64 y.o.   MRN: [id number]  Patient presents for 3 month F/U and Weight Gain  Pt here to f/u 1. Concerned she has gained 30 lbs over past few months since her husband died, I reviewed chart weight gain of 13 pounds since Oct 2015, she is eating twice a day and small amounts, has treadmill at home. She did have to restart some GI meds- PPI recently and has f/u with them  2.Vaginal irritaion has burning sensation and itching, uses vagisil or small amount of monistat at times, told by GYN she has atrophy in past   3. Anxiety- back on xanax now helps her sleep at night, has support from friends around her  Meds reviewed   Review Of Systems:  GEN- denies fatigue, fever, weight loss,weakness, recent illness HEENT- denies eye drainage, change in vision, nasal discharge, CVS- denies chest pain, palpitations RESP- denies SOB, cough, wheeze ABD- denies N/V, change in stools, abd pain GU- denies dysuria, hematuria, dribbling, incontinence MSK- denies joint pain, muscle aches, injury Neuro- denies headache, dizziness, syncope, seizure activity       Objective:    BP 118/60 mmHg  Pulse 72  Temp(Src) 98.6 F (37 C) (Oral)  Resp 14  Ht 5' 4"  (1.626 m)  Wt 209 lb (94.802 kg)  BMI 35.86 kg/m2 GEN- NAD, alert and oriented x3 HEENT- PERRL, EOMI, non injected sclera, pink conjunctiva, MMM, oropharynx clear CVS- RRR, no murmur RESP-CTAB ABD-NABS,soft,NT,ND GU- atrophy of labia minora and vaginal canal mild dryness, could not tolerate spectrum, no gross bleeding, minimal discharge seen- blind swab for yeast  Psych- normal affect and mood EXT- No edema Pulses- Radial 2+        Assessment & Plan:      Problem List Items Addressed This Visit      Unprioritized   Obesity    10 pound weight gain we discussed that she is not getting  enough calories in we also discussed the types of food she is to eat fruits and vegetables low carbs and water. She will also continue exercising 30 minutes a day 5 days a week in the home.      NAFLD (nonalcoholic fatty liver disease)   Hyperlipidemia    Continue Tricor      Essential hypertension, benign - Primary    Well controlled no change to meds      Relevant Orders   WET PREP FOR [place], YEAST, CLUE (Completed)   Comprehensive metabolic panel   CBC with Differential/Platelet   Anxiety    Continue xanax, which she resumed recently      Relevant Medications   ALPRAZolam (XANAX) tablet    Other Visit Diagnoses    Vaginitis and vulvovaginitis        Wet prep neg for infection, I think this is more vaginal atrophy trial of topical estrace    Relevant Orders    Lipid panel       Note: This dictation was prepared with Dragon dictation along with smaller phrase technology. Any transcriptional errors that result from this process are unintentional.

## 2014-11-14 NOTE — Patient Instructions (Signed)
Continue current medications Follow up with your gastroenterologist Try vagisil products We will call with lab results F/U 4 months

## 2014-11-14 NOTE — Assessment & Plan Note (Signed)
Continue Tricor

## 2014-11-14 NOTE — Assessment & Plan Note (Signed)
Continue xanax, which she resumed recently

## 2014-11-14 NOTE — Assessment & Plan Note (Signed)
Well controlled no change to meds

## 2014-11-15 ENCOUNTER — Ambulatory Visit (HOSPITAL_COMMUNITY)

## 2014-11-16 ENCOUNTER — Other Ambulatory Visit: Payer: Self-pay | Admitting: *Deleted

## 2014-11-16 DIAGNOSIS — E782 Mixed hyperlipidemia: Secondary | ICD-10-CM

## 2014-11-16 DIAGNOSIS — R945 Abnormal results of liver function studies: Principal | ICD-10-CM

## 2014-11-16 DIAGNOSIS — R7989 Other specified abnormal findings of blood chemistry: Secondary | ICD-10-CM

## 2014-11-21 ENCOUNTER — Telehealth: Payer: Self-pay | Admitting: Family Medicine

## 2014-11-21 NOTE — Telephone Encounter (Signed)
Patient says that pharmacy did not receive the fax back from [place] regarding her irbesartan  Please call her at [phone] (they got [place])

## 2014-11-22 ENCOUNTER — Ambulatory Visit (HOSPITAL_COMMUNITY)

## 2014-11-22 MED ORDER — IRBESARTAN 150 MG PO TABS: 150.0000 mg | ORAL_TABLET | Freq: Every day | ORAL | Status: DC

## 2014-11-22 NOTE — Telephone Encounter (Signed)
Prescription re-submitted.   Call placed to patient and patient made aware.

## 2014-11-27 ENCOUNTER — Encounter: Payer: Self-pay | Admitting: *Deleted

## 2015-01-03 ENCOUNTER — Ambulatory Visit (HOSPITAL_COMMUNITY)

## 2015-01-03 ENCOUNTER — Ambulatory Visit (HOSPITAL_COMMUNITY)
Admission: RE | Admit: 2015-01-03 | Discharge: 2015-01-03 | Disposition: A | Discharge status: Home / Self Care | Source: Ambulatory Visit | Attending: Family Medicine | Admitting: Family Medicine

## 2015-01-03 DIAGNOSIS — Z1231 Encounter for screening mammogram for malignant neoplasm of breast: Secondary | ICD-10-CM | POA: Insufficient documentation

## 2015-01-04 ENCOUNTER — Other Ambulatory Visit: Payer: Self-pay | Admitting: Family Medicine

## 2015-01-04 LAB — COMPLETE METABOLIC PANEL WITH GFR
ALT: 52 U/L — AB (ref 0–35)
AST: 65 U/L — ABNORMAL HIGH (ref 0–37)
Albumin: 4.4 g/dL (ref 3.5–5.2)
Alkaline Phosphatase: 44 U/L (ref 39–117)
BILIRUBIN TOTAL: 1 mg/dL (ref 0.2–1.2)
BUN: 16 mg/dL (ref 6–23)
CALCIUM: 9.8 mg/dL (ref 8.4–10.5)
CO2: 22 meq/L (ref 19–32)
CREATININE: 0.97 mg/dL (ref 0.50–1.10)
Chloride: 102 mEq/L (ref 96–112)
GFR, EST AFRICAN AMERICAN: 72 mL/min
GFR, Est Non African American: 63 mL/min
Glucose, Bld: 116 mg/dL — ABNORMAL HIGH (ref 70–99)
Potassium: 4 mEq/L (ref 3.5–5.3)
Sodium: 138 mEq/L (ref 135–145)
Total Protein: 7.2 g/dL (ref 6.0–8.3)

## 2015-01-04 LAB — LIPID PANEL
Cholesterol: 169 mg/dL (ref 0–200)
HDL: 25 mg/dL — ABNORMAL LOW
LDL Cholesterol: 73 mg/dL (ref 0–99)
Total CHOL/HDL Ratio: 6.8 ratio
Triglycerides: 355 mg/dL — ABNORMAL HIGH
VLDL: 71 mg/dL — ABNORMAL HIGH (ref 0–40)

## 2015-01-22 ENCOUNTER — Telehealth: Payer: Self-pay | Admitting: Family Medicine

## 2015-01-22 DIAGNOSIS — I10 Essential (primary) hypertension: Secondary | ICD-10-CM

## 2015-01-22 NOTE — Telephone Encounter (Signed)
Okay to refill all 90 day except for xanax and ambien as they are controlled substances

## 2015-01-22 NOTE — Telephone Encounter (Signed)
Ok to refill with 90 day supply?

## 2015-01-22 NOTE — Telephone Encounter (Signed)
[place] Pt is needing refills on   brimonidine (ALPHAGAN) 0.2 % ophthalmic solution   estradiol (ESTRACE VAGINAL) 0.1 MG/GM vaginal cream (she states that she is doing great on this medication)   fenofibrate (TRICOR) 145 MG tablet   hydrochlorothiazide (HYDRODIURIL) 12.5 MG tablet       zolpidem (AMBIEN) 10 MG tablet ALPRAZolam (XANAX) 1 MG tablet latanoprost (XALATAN) 0.005 % ophthalmic solution

## 2015-01-23 ENCOUNTER — Telehealth: Payer: Self-pay | Admitting: *Deleted

## 2015-01-23 ENCOUNTER — Ambulatory Visit (INDEPENDENT_AMBULATORY_CARE_PROVIDER_SITE_OTHER): Admitting: Family Medicine

## 2015-01-23 ENCOUNTER — Encounter: Payer: Self-pay | Admitting: Gastroenterology

## 2015-01-23 ENCOUNTER — Ambulatory Visit (INDEPENDENT_AMBULATORY_CARE_PROVIDER_SITE_OTHER): Admitting: Gastroenterology

## 2015-01-23 ENCOUNTER — Encounter: Payer: Self-pay | Admitting: Family Medicine

## 2015-01-23 VITALS — BP 128/73 | HR 103 | Temp 97.6°F | Ht 67.0 in | Wt 209.6 lb

## 2015-01-23 VITALS — BP 130/68 | HR 78 | Temp 99.1°F | Resp 16 | Ht 64.0 in | Wt 208.0 lb

## 2015-01-23 DIAGNOSIS — I10 Essential (primary) hypertension: Secondary | ICD-10-CM

## 2015-01-23 DIAGNOSIS — N3001 Acute cystitis with hematuria: Secondary | ICD-10-CM

## 2015-01-23 DIAGNOSIS — Z800 Family history of malignant neoplasm of digestive organs: Secondary | ICD-10-CM

## 2015-01-23 DIAGNOSIS — K7581 Nonalcoholic steatohepatitis (NASH): Secondary | ICD-10-CM

## 2015-01-23 DIAGNOSIS — K589 Irritable bowel syndrome without diarrhea: Secondary | ICD-10-CM | POA: Diagnosis not present

## 2015-01-23 DIAGNOSIS — K648 Other hemorrhoids: Secondary | ICD-10-CM

## 2015-01-23 DIAGNOSIS — R319 Hematuria, unspecified: Secondary | ICD-10-CM | POA: Diagnosis not present

## 2015-01-23 DIAGNOSIS — K219 Gastro-esophageal reflux disease without esophagitis: Secondary | ICD-10-CM | POA: Diagnosis not present

## 2015-01-23 DIAGNOSIS — R1011 Right upper quadrant pain: Secondary | ICD-10-CM | POA: Diagnosis not present

## 2015-01-23 LAB — URINALYSIS, ROUTINE W REFLEX MICROSCOPIC
Glucose, UA: NEGATIVE mg/dL
Ketones, ur: NEGATIVE mg/dL
Nitrite: POSITIVE — AB
Specific Gravity, Urine: 1.02 (ref 1.005–1.030)
UROBILINOGEN UA: 1 mg/dL (ref 0.0–1.0)
pH: 6.5 (ref 5.0–8.0)

## 2015-01-23 LAB — URINALYSIS, MICROSCOPIC ONLY
CASTS: NONE SEEN
CRYSTALS: NONE SEEN

## 2015-01-23 MED ORDER — CEPHALEXIN 500 MG PO CAPS: 500.0000 mg | ORAL_CAPSULE | Freq: Four times a day (QID) | ORAL | Status: DC

## 2015-01-23 MED ORDER — BRIMONIDINE TARTRATE 0.2 % OP SOLN: 1.0000 [drp] | Freq: Two times a day (BID) | OPHTHALMIC | Status: DC

## 2015-01-23 MED ORDER — ALPRAZOLAM 1 MG PO TABS: 1.0000 mg | ORAL_TABLET | Freq: Two times a day (BID) | ORAL | Status: DC | PRN

## 2015-01-23 MED ORDER — PHENAZOPYRIDINE HCL 100 MG PO TABS: 100.0000 mg | ORAL_TABLET | Freq: Three times a day (TID) | ORAL | Status: DC | PRN

## 2015-01-23 MED ORDER — LATANOPROST 0.005 % OP SOLN: 1.0000 [drp] | Freq: Every day | OPHTHALMIC | Status: DC

## 2015-01-23 MED ORDER — HYDROCHLOROTHIAZIDE 12.5 MG PO TABS: 12.5000 mg | ORAL_TABLET | Freq: Every day | ORAL | Status: DC

## 2015-01-23 MED ORDER — ESTRADIOL 0.1 MG/GM VA CREA: 1.0000 | TOPICAL_CREAM | VAGINAL | Status: DC

## 2015-01-23 MED ORDER — FENOFIBRATE 145 MG PO TABS
145.0000 mg | ORAL_TABLET | Freq: Every day | ORAL | Status: DC
Start: 2015-01-23 — End: 2015-04-22

## 2015-01-23 MED ORDER — LANSOPRAZOLE 30 MG PO CPDR: DELAYED_RELEASE_CAPSULE | ORAL | Status: DC

## 2015-01-23 MED ORDER — ZOLPIDEM TARTRATE 10 MG PO TABS: ORAL_TABLET | ORAL | Status: DC

## 2015-01-23 NOTE — Progress Notes (Signed)
Patient ID: [name], female   DOB: 1950-10-22, 64 y.o.   MRN: [id number]   Subjective:    Patient ID: [name], female    DOB: 01-04-1951, 64 y.o.   MRN: [id number]  Patient presents for Hematuria  patient here with hematuria dysuria for the past 24 hours. She was in her normal state of health until then. She denies any fever or nausea vomiting. She does have pain over her bladder region. She is taking all of her medications as prescribed with the exception of her Avapro her blood pressure was starting to decrease therefore she discontinued this as she is only been on the hydrochlorothiazide    Review Of Systems:  GEN- denies fatigue, fever, weight loss,weakness, recent illness HEENT- denies eye drainage, change in vision, nasal discharge, CVS- denies chest pain, palpitations RESP- denies SOB, cough, wheeze ABD- denies N/V, change in stools, abd pain GU- + dysuria, hematuria, dribbling, incontinence MSK- denies joint pain, muscle aches, injury Neuro- denies headache, dizziness, syncope, seizure activity       Objective:    BP 130/68 mmHg  Pulse 78  Temp(Src) 99.1 F (37.3 C) (Oral)  Resp 16  Ht 5' 4"  (1.626 m)  Wt 208 lb (94.348 kg)  BMI 35.69 kg/m2 GEN- NAD, alert and oriented x3 CVS- RRR, no murmur RESP-CTAB ABD-NABS,soft,TTP suprapubic region, no CVA tenderness, no rebound, no gaurding,ND EXT- No edema Pulses- Radial 2+        Assessment & Plan:      Problem List Items Addressed This Visit    Hematuria - Primary   Relevant Orders   Urinalysis, Routine w reflex microscopic (Completed)    Other Visit Diagnoses    Acute cystitis with hematuria        Urine culture sent, start Keflex QID, Cipro on her GP6D list of meds not to take, AZO also given, No red flags on exam    Relevant Orders    Urine culture       Note: This dictation was prepared with Dragon dictation along with smaller phrase technology. Any transcriptional errors that result from  this process are unintentional.

## 2015-01-23 NOTE — Assessment & Plan Note (Signed)
NO WARNING SIGNS/SYMPTOMS & likely due to an adhesion or dyspepsia.  CONTINUE TO MONITOR SYMPTOMS. FOLLOW UP IN 6 MOS.

## 2015-01-23 NOTE — Progress Notes (Signed)
ON RECALL LIST  °

## 2015-01-23 NOTE — Assessment & Plan Note (Signed)
SYMPTOMS FAIRLY WELL CONTROLLED with diet.

## 2015-01-23 NOTE — Assessment & Plan Note (Signed)
LIVER ENZYMES MILDLY ELEVATED. WEIGHT STABLE.  CONTINUE WEIGHT LOSS EFFORTS. LOW FAT DIET FOLLOW UP IN 6 MOS.

## 2015-01-23 NOTE — Assessment & Plan Note (Signed)
ONE EPISODE BRBP WITHIN LAST MO LIKELY DUE TO INCREASED # LOOSE STOOLS FROM DIETARY CHOICES.  STOP MIRALAX. CONTINUE KRILL OIL/COCONUT OIL. FOLLOW UP IN 4 MOS.

## 2015-01-23 NOTE — Progress Notes (Signed)
cc'ed to pcp °

## 2015-01-23 NOTE — Telephone Encounter (Signed)
Prescriptions faxed.
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
## 2015-01-23 NOTE — Patient Instructions (Signed)
Take antibiotics as prescribed Take the pyridium for irritation F/U as previous

## 2015-01-23 NOTE — Telephone Encounter (Signed)
Received call from [facility].   Was advised that patient has allergy to PCN noted and Keflex has 10% chance of cross reaction. Advised that patient has not received prescription for Keflex in the past from their pharmacy.   No other prescription noted in chart for Keflex.   MD made aware and advised that chance of reaction is small and the ABTx is the best treatment for her hematuria.   Pharmacy made aware.

## 2015-01-23 NOTE — Assessment & Plan Note (Signed)
SYMPTOMS FAIRLY WELL CONTROLLED ON PRN PREVACID 15 MG.   CONTINUE WEIGHT LOSS EFFORTS. CONTINUE PREVACID. TAKE 30 MINUTES PRIOR TO BREAKFAST. REFILL x1 YEAR/60D SUPPLY. FOLLOW UP IN 6 MOS.

## 2015-01-23 NOTE — Patient Instructions (Signed)
STOP USING MIRALAX.  CONTINUE YOUR WEIGHT LOSS EFFORTS.  FOLLOW A LOW FAT DIET. MEATS SHOULD BE BAKED, BROILED, OR BOILED.  CONTINUE PREVACID. TAKE 30 MINUTES PRIOR TO BREAKFAST. I REFILLED IT FOR ONE YEAR.  FOLLOW UP IN 6 MOS.

## 2015-01-23 NOTE — Assessment & Plan Note (Signed)
TCS UTD  NEXT TCS MAR 2017. DISCUSSED WITH PT.

## 2015-01-23 NOTE — Assessment & Plan Note (Signed)
We'll continue to monitor her blood pressure off of Avapro, continue hydrochlorothiazide

## 2015-01-24 ENCOUNTER — Telehealth: Payer: Self-pay | Admitting: *Deleted

## 2015-01-24 NOTE — Telephone Encounter (Signed)
Received call from patient.  Reports that she forgot to mention to PCP that she has been having some R sided flank pain. She has been sleeping with a heating pad for the pain, but does feel that the pain is deeper than that of muscle pain.   States that pain is tolerable at this time. Advised to avoid heating pad at this time.  MD to be made aware.

## 2015-01-25 NOTE — Telephone Encounter (Signed)
noted 

## 2015-01-26 LAB — URINE CULTURE: Colony Count: >=100000

## 2015-02-11 ENCOUNTER — Telehealth: Payer: Self-pay | Admitting: Family Medicine

## 2015-02-11 NOTE — Telephone Encounter (Signed)
Patient is calling to say the there was a mistake with the quantity of her zolpidem say she received 30 but it should be 90 please call her back at [phone]

## 2015-02-11 NOTE — Telephone Encounter (Signed)
Will discuss with pt in OV

## 2015-02-11 NOTE — Telephone Encounter (Signed)
Call placed to patient.   Patient states that she received only 30 tabs of Ambien where she should have received 90 from mail order.   Upon review of chart notes on 01/22/2015, MD approved 30 day supply of Ambien and Xanax D/T controlled substance.   Patient reports that she is traveling a lot and is supposed to always get 90 day supply. Advised that prescription filled per MD recommendations.   Patient became argumentative stating that she would just have to talk to MD about "messing up" her meds.  Also reports rash to hands since taking ABTx for hematuria. Advised that OV would be needed for eval. Appointment scheduled.

## 2015-02-12 ENCOUNTER — Ambulatory Visit (INDEPENDENT_AMBULATORY_CARE_PROVIDER_SITE_OTHER): Admitting: Family Medicine

## 2015-02-12 ENCOUNTER — Encounter: Payer: Self-pay | Admitting: Family Medicine

## 2015-02-12 VITALS — BP 126/80 | HR 82 | Temp 98.6°F | Resp 16 | Ht 67.0 in | Wt 209.0 lb

## 2015-02-12 DIAGNOSIS — F329 Major depressive disorder, single episode, unspecified: Secondary | ICD-10-CM | POA: Diagnosis not present

## 2015-02-12 DIAGNOSIS — M5441 Lumbago with sciatica, right side: Secondary | ICD-10-CM

## 2015-02-12 DIAGNOSIS — F32A Depression, unspecified: Secondary | ICD-10-CM | POA: Insufficient documentation

## 2015-02-12 DIAGNOSIS — F419 Anxiety disorder, unspecified: Secondary | ICD-10-CM | POA: Diagnosis not present

## 2015-02-12 MED ORDER — ESCITALOPRAM OXALATE 10 MG PO TABS: 10.0000 mg | ORAL_TABLET | Freq: Every day | ORAL | Status: DC

## 2015-02-12 MED ORDER — ALPRAZOLAM 1 MG PO TABS: 1.0000 mg | ORAL_TABLET | Freq: Two times a day (BID) | ORAL | Status: DC | PRN

## 2015-02-12 MED ORDER — ZOLPIDEM TARTRATE 10 MG PO TABS
ORAL_TABLET | ORAL | Status: DC
Start: 2015-02-12 — End: 2015-04-22

## 2015-02-12 NOTE — Progress Notes (Signed)
Patient ID: [name], female   DOB: 1951-01-07, 64 y.o.   MRN: [id number]   Subjective:    Patient ID: [name], female    DOB: January 02, 1951, 64 y.o.   MRN: [id number]  Patient presents for Rash and Sciatic Pain- R Side  patient presents with increased depression and anxiety the death of her husband which was a year ago is coming up in a few weeks. She finds that all of her pain symptoms and anxiety are all culminating at this time. She is actually planning to leave and go to [place] to buy a condo and she thinks that this will be at [facility] for her. She still getting therapy with the [facility] and they have advised her multiple times to try medication for her anxiety and depression and now she is willing to do so. She states that her nerves suggest bad she has been itching in her palms but there is no actual rash. She also finds herself crying at times. She typically takes the alprazolam once a day. She also states that she was moving some boxes and furniture and flared up her sciatica this weekend. She took ibuprofen which has helped some.      Review Of Systems:  GEN- denies fatigue, fever, weight loss,weakness, recent illness HEENT- denies eye drainage, change in vision, nasal discharge, CVS- denies chest pain, palpitations RESP- denies SOB, cough, wheeze ABD- denies N/V, change in stools, abd pain GU- denies dysuria, hematuria, dribbling, incontinence MSK- denies joint pain, muscle aches, injury Neuro- denies headache, dizziness, syncope, seizure activity       Objective:    BP 126/80 mmHg  Pulse 82  Temp(Src) 98.6 F (37 C) (Oral)  Resp 16  Ht 5' 7"  (1.702 m)  Wt 209 lb (94.802 kg)  BMI 32.73 kg/m2 GEN- NAD, alert and oriented x3 HEENT- PERRL, EOMI, non injected sclera, pink conjunctiva, MMM, oropharynx clear Neck- Supple, no thyromegaly CVS- RRR, no murmur RESP-CTAB MSK- Spine NT, neg SLR, fair ROM PSYCH- depressed affect, tearful, good eye  contact, not anxious appearing, no SI/HI Skin- no rash on palms or hands, no swelling noted EXT- No edema Pulses- Radial, DP- 2+        Assessment & Plan:      Problem List Items Addressed This Visit    Depression    Start lexapro 67m to help depression and anxiety, continue ativan can use BID as needed      Relevant Medications   escitalopram (LEXAPRO) 10 MG tablet   ALPRAZolam (XANAX) 1 MG tablet   Back pain with sciatica    PRN NSAIDS does not want to use any more meds, chronic issue with flare due to increased activity, no red flags, no imaging needed      Relevant Medications   escitalopram (LEXAPRO) 10 MG tablet   ALPRAZolam (XANAX) 1 MG tablet   zolpidem (AMBIEN) 10 MG tablet   Anxiety - Primary   Relevant Medications   escitalopram (LEXAPRO) 10 MG tablet   ALPRAZolam (XANAX) 1 MG tablet      Note: This dictation was prepared with Dragon dictation along with smaller phrase technology. Any transcriptional errors that result from this process are unintentional.

## 2015-02-12 NOTE — Patient Instructions (Signed)
Start lexapro as prescribed - you can take at bedtime Use xanax twice a day as needed for your nerves Use the arnica rub on your back  Okay to use the golds bond powder Move June appt to July

## 2015-02-13 ENCOUNTER — Encounter: Payer: Self-pay | Admitting: Family Medicine

## 2015-02-13 DIAGNOSIS — M51379 Other intervertebral disc degeneration, lumbosacral region without mention of lumbar back pain or lower extremity pain: Secondary | ICD-10-CM | POA: Insufficient documentation

## 2015-02-13 DIAGNOSIS — M549 Dorsalgia, unspecified: Secondary | ICD-10-CM

## 2015-02-13 DIAGNOSIS — M5430 Sciatica, unspecified side: Secondary | ICD-10-CM | POA: Insufficient documentation

## 2015-02-13 DIAGNOSIS — M5137 Other intervertebral disc degeneration, lumbosacral region: Secondary | ICD-10-CM | POA: Insufficient documentation

## 2015-02-13 NOTE — Assessment & Plan Note (Addendum)
PRN NSAIDS does not want to use any more meds, chronic issue with flare due to increased activity, no red flags, no imaging needed

## 2015-02-13 NOTE — Assessment & Plan Note (Signed)
Start lexapro 28m to help depression and anxiety, continue ativan can use BID as needed

## 2015-03-15 ENCOUNTER — Ambulatory Visit: Admitting: Family Medicine

## 2015-03-29 ENCOUNTER — Ambulatory Visit: Admitting: Family Medicine

## 2015-04-22 ENCOUNTER — Encounter: Payer: Self-pay | Admitting: Family Medicine

## 2015-04-22 ENCOUNTER — Ambulatory Visit (INDEPENDENT_AMBULATORY_CARE_PROVIDER_SITE_OTHER): Admitting: Family Medicine

## 2015-04-22 VITALS — BP 128/78 | HR 76 | Temp 98.3°F | Resp 14 | Ht 67.0 in | Wt 207.0 lb

## 2015-04-22 DIAGNOSIS — K219 Gastro-esophageal reflux disease without esophagitis: Secondary | ICD-10-CM | POA: Diagnosis not present

## 2015-04-22 DIAGNOSIS — E785 Hyperlipidemia, unspecified: Secondary | ICD-10-CM

## 2015-04-22 DIAGNOSIS — E669 Obesity, unspecified: Secondary | ICD-10-CM | POA: Diagnosis not present

## 2015-04-22 DIAGNOSIS — F329 Major depressive disorder, single episode, unspecified: Secondary | ICD-10-CM

## 2015-04-22 DIAGNOSIS — R790 Abnormal level of blood mineral: Secondary | ICD-10-CM

## 2015-04-22 DIAGNOSIS — I10 Essential (primary) hypertension: Secondary | ICD-10-CM | POA: Diagnosis not present

## 2015-04-22 DIAGNOSIS — N182 Chronic kidney disease, stage 2 (mild): Secondary | ICD-10-CM | POA: Diagnosis not present

## 2015-04-22 DIAGNOSIS — K2970 Gastritis, unspecified, without bleeding: Secondary | ICD-10-CM | POA: Diagnosis not present

## 2015-04-22 DIAGNOSIS — F32A Depression, unspecified: Secondary | ICD-10-CM

## 2015-04-22 MED ORDER — ALPRAZOLAM 1 MG PO TABS: 1.0000 mg | ORAL_TABLET | Freq: Two times a day (BID) | ORAL | Status: DC | PRN

## 2015-04-22 MED ORDER — ONDANSETRON 4 MG PO TBDP: 4.0000 mg | ORAL_TABLET | Freq: Three times a day (TID) | ORAL | Status: DC | PRN

## 2015-04-22 MED ORDER — BRIMONIDINE TARTRATE 0.2 % OP SOLN: 1.0000 [drp] | Freq: Two times a day (BID) | OPHTHALMIC | Status: DC

## 2015-04-22 MED ORDER — ZOLPIDEM TARTRATE 10 MG PO TABS: ORAL_TABLET | ORAL | Status: DC

## 2015-04-22 MED ORDER — DEXLANSOPRAZOLE 60 MG PO CPDR: 60.0000 mg | DELAYED_RELEASE_CAPSULE | Freq: Every day | ORAL | Status: DC

## 2015-04-22 MED ORDER — HYDROCHLOROTHIAZIDE 12.5 MG PO TABS: 12.5000 mg | ORAL_TABLET | Freq: Every day | ORAL | Status: DC

## 2015-04-22 MED ORDER — FENOFIBRATE 145 MG PO TABS: 145.0000 mg | ORAL_TABLET | Freq: Every day | ORAL | Status: DC

## 2015-04-22 NOTE — Assessment & Plan Note (Signed)
Concern for gastritis in setting of GERD, which pt has had in past Given dexliant 68m to try from office Check labs, abd exam fairly benign

## 2015-04-22 NOTE — Progress Notes (Signed)
Patient ID: [name], female   DOB: [birth year], 64 y.o.   MRN: [id number]   Subjective:    Patient ID: [name], female    DOB: [birth year], 64 y.o.   MRN: [id number]  Patient presents for 4 month F/U and Medication Refill  Patient here to follow-up medication. She never started she states that she was going out of town and did not want any medication at that time. She states that the pharmacist told her that is worse when she first started therefore still has not started the medication despite her depressive episodes increased for her loss husband. She is planning to go back to [place] for 3 months with her son. She does use the Xanax as needed. She is also noted that she had upset stomach and epigastric discomfort for the past. She thought she may have diabetes as her symptoms. For lunch she has also been under a lot of stress or she was initially sent your bowels. She denies any change in her stool. She had some nausea and vomiting with nausea and has been severe. She has been taking Prevacid Celebrex as prescribed by gastroenterology which helps.  Reviewed supplements, Mg does not cause any SE  Review Of Systems:  GEN- denies fatigue, fever, weight loss,weakness, recent illness HEENT- denies eye drainage, change in vision, nasal discharge, CVS- denies chest pain, palpitations RESP- denies SOB, cough, wheeze ABD- denies N/V, change in stools, +abd pain GU- denies dysuria, hematuria, dribbling, incontinence MSK- denies joint pain, muscle aches, injury Neuro- denies headache, dizziness, syncope, seizure activity       Objective:    BP 128/78 mmHg  Pulse 76  Temp(Src) 98.3 F (36.8 C) (Oral)  Resp 14  Ht 5' 7"  (1.702 m)  Wt 207 lb (93.895 kg)  BMI 32.41 kg/m2 GEN- NAD, alert and oriented x3 HEENT- PERRL, EOMI, non injected sclera, pink conjunctiva, MMM, oropharynx clear Neck- Supple, no thyromegaly CVS- RRR, no murmur RESP-CTAB ABD-NABS-soft, mild TTP epigastric  region, no rebound, no guarding, ND PSYCH- depressed affect,, not anxious appearing, no SI/HI EXT- No edema Pulses- Radial, DP-      Assessment & Plan:      Problem List Items Addressed This Visit    Obesity - Primary   Hyperlipidemia   Relevant Orders   Lipid panel   Depression   CKD (chronic kidney disease), stage II   Relevant Orders   CBC with Differential/Platelet   Comprehensive metabolic panel    Other Visit Diagnoses    Low magnesium levels        Relevant Orders    Magnesium       Note: This dictation was prepared with Dragon dictation along with smaller phrase technology. Any transcriptional errors that result from this process are unintentional.

## 2015-04-22 NOTE — Patient Instructions (Addendum)
Take the dexilant 51m once a day  Hold the prevacid  Zofran for nausea   Start the lexapro once a day  Continue current medications F/U in January

## 2015-04-22 NOTE — Assessment & Plan Note (Signed)
Discussed the lexpro, pt willing to try, needs daily medication, continue xanax, typically takes 54m a day

## 2015-04-22 NOTE — Assessment & Plan Note (Signed)
Controlled on HCTZ

## 2015-04-23 LAB — CBC WITH DIFFERENTIAL/PLATELET
Basophils Absolute: 0.1 10*3/uL (ref 0.0–0.1)
Basophils Relative: 1 % (ref 0–1)
EOS ABS: 0.3 10*3/uL (ref 0.0–0.7)
Eosinophils Relative: 5 % (ref 0–5)
HCT: 38.6 % (ref 36.0–46.0)
Hemoglobin: 13.1 g/dL (ref 12.0–15.0)
LYMPHS PCT: 27 % (ref 12–46)
Lymphs Abs: 1.7 10*3/uL (ref 0.7–4.0)
MCH: 32.6 pg (ref 26.0–34.0)
MCHC: 33.9 g/dL (ref 30.0–36.0)
MCV: 96 fL (ref 78.0–100.0)
MONO ABS: 0.5 10*3/uL (ref 0.1–1.0)
MPV: 9.5 fL (ref 8.6–12.4)
Monocytes Relative: 8 % (ref 3–12)
NEUTROS ABS: 3.7 10*3/uL (ref 1.7–7.7)
NEUTROS PCT: 59 % (ref 43–77)
PLATELETS: 192 10*3/uL (ref 150–400)
RBC: 4.02 MIL/uL (ref 3.87–5.11)
RDW: 13.2 % (ref 11.5–15.5)
WBC: 6.3 10*3/uL (ref 4.0–10.5)

## 2015-04-23 LAB — COMPREHENSIVE METABOLIC PANEL
ALT: 65 U/L — AB (ref 0–35)
AST: 91 U/L — ABNORMAL HIGH (ref 0–37)
Albumin: 4.5 g/dL (ref 3.5–5.2)
Alkaline Phosphatase: 35 U/L — ABNORMAL LOW (ref 39–117)
BILIRUBIN TOTAL: 0.9 mg/dL (ref 0.2–1.2)
BUN: 15 mg/dL (ref 6–23)
CHLORIDE: 100 meq/L (ref 96–112)
CO2: 26 mEq/L (ref 19–32)
Calcium: 10.3 mg/dL (ref 8.4–10.5)
Creat: 1.03 mg/dL (ref 0.50–1.10)
Glucose, Bld: 109 mg/dL — ABNORMAL HIGH (ref 70–99)
POTASSIUM: 4.1 meq/L (ref 3.5–5.3)
SODIUM: 137 meq/L (ref 135–145)
Total Protein: 7.3 g/dL (ref 6.0–8.3)

## 2015-04-23 LAB — LIPID PANEL
Cholesterol: 188 mg/dL (ref 0–200)
HDL: 27 mg/dL — ABNORMAL LOW (ref >=46)
LDL Cholesterol: 91 mg/dL (ref 0–99)
Total CHOL/HDL Ratio: 7 Ratio
Triglycerides: 348 mg/dL — ABNORMAL HIGH (ref <150)
VLDL: 70 mg/dL — AB (ref 0–40)

## 2015-04-23 LAB — MAGNESIUM: MAGNESIUM: 1.8 mg/dL (ref 1.5–2.5)

## 2015-04-26 ENCOUNTER — Encounter: Payer: Self-pay | Admitting: Gastroenterology

## 2015-05-06 ENCOUNTER — Telehealth: Payer: Self-pay | Admitting: Family Medicine

## 2015-05-06 MED ORDER — LATANOPROST 0.005 % OP SOLN: 1.0000 [drp] | Freq: Every day | OPHTHALMIC | Status: DC

## 2015-05-06 MED ORDER — IRBESARTAN 150 MG PO TABS: 75.0000 mg | ORAL_TABLET | Freq: Every day | ORAL | Status: DC

## 2015-05-06 NOTE — Telephone Encounter (Signed)
Refill sent and pt made aware

## 2015-05-06 NOTE — Telephone Encounter (Signed)
Okay to refill with instructions for 1/2 tab

## 2015-05-06 NOTE — Telephone Encounter (Signed)
Pt calling for refill of her Irbesartan 150 mg.  Do not see on med list.  Per OV note from April, looks like med was discontinued.  Pt states she has never stopped taking this medication but is only taking 1/2 tablet.  Please advise on refill?

## 2015-06-12 ENCOUNTER — Encounter: Payer: Self-pay | Admitting: Gastroenterology

## 2015-06-25 ENCOUNTER — Encounter: Payer: Self-pay | Admitting: Family Medicine

## 2015-07-02 ENCOUNTER — Telehealth: Payer: Self-pay | Admitting: Family Medicine

## 2015-07-02 MED ORDER — DEXLANSOPRAZOLE 60 MG PO CPDR: 60.0000 mg | DELAYED_RELEASE_CAPSULE | Freq: Every day | ORAL | Status: DC

## 2015-07-02 NOTE — Telephone Encounter (Signed)
Patient called in requesting a prescription called into [facility] for 20 day supply fordexlansoprazole (DEXILANT) 60 MG since she is on her last pill and she doesn't have to pay if we send to [facility] and remainder refill to the mail order she states is on file.

## 2015-07-02 NOTE — Telephone Encounter (Signed)
Prescriptions sent to pharmacy

## 2015-07-18 ENCOUNTER — Encounter: Payer: Self-pay | Admitting: Gastroenterology

## 2015-07-18 ENCOUNTER — Ambulatory Visit (INDEPENDENT_AMBULATORY_CARE_PROVIDER_SITE_OTHER): Admitting: Gastroenterology

## 2015-07-18 VITALS — BP 114/73 | HR 95 | Temp 98.1°F | Ht 67.0 in | Wt 209.8 lb

## 2015-07-18 DIAGNOSIS — K7581 Nonalcoholic steatohepatitis (NASH): Secondary | ICD-10-CM

## 2015-07-18 DIAGNOSIS — K582 Mixed irritable bowel syndrome: Secondary | ICD-10-CM | POA: Diagnosis not present

## 2015-07-18 MED ORDER — CYCLOBENZAPRINE HCL 10 MG PO TABS: 10.0000 mg | ORAL_TABLET | Freq: Every day | ORAL | Status: DC

## 2015-07-18 NOTE — Progress Notes (Signed)
cc'ed to pcp °

## 2015-07-18 NOTE — Assessment & Plan Note (Signed)
WEIGHT STABLE.  CONTINUE YOUR WEIGHT LOSS EFFORTS. OUTPATIENT VISIT IN 6 MOS. REPEAT HFP 1 WEEK PRIOR TO VISIT.

## 2015-07-18 NOTE — Progress Notes (Signed)
ON RECALL  °
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
## 2015-07-18 NOTE — Assessment & Plan Note (Signed)
SYMPTOMS NOT IDEALLY CONTROLLED.  ADD FDGARD WITH 8 OZ WATER DAILY. Flexeril prn to help RUQ ABDOMINAL PAIN CONTINUE PROBIOTIC FOLLOW UP IN 6 MOS.

## 2015-07-18 NOTE — Patient Instructions (Addendum)
CONTINUE YOUR WEIGHT LOSS EFFORTS.  ADD FDGARD WITH 8 OZ WATER DAILY TO REDUCE BLOATIN.  Flexeril AS NEEDED to help RUQ ABDOMINAL PAIN  CONTINUE PROBIOTIC DAILY.  NEXT OUTPATIENT VISIT IN 6 MOS. YOU NEED A REPEAT LIVER PANEL 1 WEEK PRIOR TO YOUR NEXT VISIT.

## 2015-07-19 ENCOUNTER — Encounter: Payer: Self-pay | Admitting: Family Medicine

## 2015-07-19 ENCOUNTER — Telehealth: Payer: Self-pay | Admitting: *Deleted

## 2015-07-19 ENCOUNTER — Ambulatory Visit (INDEPENDENT_AMBULATORY_CARE_PROVIDER_SITE_OTHER): Admitting: Family Medicine

## 2015-07-19 VITALS — BP 124/66 | HR 72 | Temp 98.3°F | Resp 14 | Ht 67.0 in | Wt 208.0 lb

## 2015-07-19 DIAGNOSIS — Z23 Encounter for immunization: Secondary | ICD-10-CM | POA: Diagnosis not present

## 2015-07-19 DIAGNOSIS — K7581 Nonalcoholic steatohepatitis (NASH): Secondary | ICD-10-CM

## 2015-07-19 DIAGNOSIS — L719 Rosacea, unspecified: Secondary | ICD-10-CM | POA: Diagnosis not present

## 2015-07-19 LAB — HEPATIC FUNCTION PANEL
ALK PHOS: 42 U/L (ref 33–130)
ALT: 34 U/L — AB (ref 6–29)
AST: 34 U/L (ref 10–35)
Albumin: 4.3 g/dL (ref 3.6–5.1)
BILIRUBIN DIRECT: 0.2 mg/dL (ref <=0.2)
BILIRUBIN INDIRECT: 0.8 mg/dL (ref 0.2–1.2)
Total Bilirubin: 1 mg/dL (ref 0.2–1.2)
Total Protein: 6.8 g/dL (ref 6.1–8.1)

## 2015-07-19 MED ORDER — METRONIDAZOLE 0.75 % EX GEL: 1.0000 "application " | Freq: Every day | CUTANEOUS | Status: DC

## 2015-07-19 MED ORDER — HYDROCOD POLST-CPM POLST ER 10-8 MG/5ML PO SUER: 5.0000 mL | Freq: Two times a day (BID) | ORAL | Status: DC | PRN

## 2015-07-19 NOTE — Addendum Note (Signed)
Addended by: Sheral Flow on: 07/19/2015 11:29 AM   Modules accepted: Orders

## 2015-07-19 NOTE — Patient Instructions (Addendum)
Use metrogel once a day for face Continue all other medications  Flu shot  Liver function To be done F/U in January as scheduled CANCEL OTHER OCT APPOINTMENT

## 2015-07-19 NOTE — Assessment & Plan Note (Signed)
Reviewed gastroenterology note no significant change abdominal exam no evidence of any fluid retention the abdomen. I will go ahead and do the liver panel but she has known chronic elevated liver function with her cholesterol and hepatic steatosis. Try to give her reassurance.

## 2015-07-19 NOTE — Telephone Encounter (Signed)
Medication called to pharmacy. 

## 2015-07-19 NOTE — Telephone Encounter (Signed)
Pt called stating she went to get her medication Metrogel and the pharmacy said it needed to say Cream instead of Gel in order for insurance to pay for it. Pt states needs to be sent to [facility] so can deliver.   Call back number [phone]

## 2015-07-19 NOTE — Progress Notes (Signed)
Patient ID: SHANDEL BUSIC, female   DOB: 1950-12-07, 64 y.o.   MRN: [id number]   Subjective:    Patient ID: KIJANA ESTOCK, female    DOB: Mar 13, 1951, 64 y.o.   MRN: [id number]  Patient presents for Rash to Face and Side Pain  A she here with a rash on her face for the past 6 weeks. Comes up as red splotchiness around her nose along with small pimples the tip of her nose on her cheeks and on her chin it does not go all over her face. The area actually causes her to have pain, has tried [employer] and some cortisone with mild improvement. Does not wear makeup.  She states around the same time she began having some right upper quadrant pain she thinks her liver is swollen. She was seen by her gastroenterologist yesterday who wanted her liver panel done she states that she told her she was leaving to go to [place] for the next couple months therefore she was going to do when she returns but she would like to go ahead and have this done. She also requests a refill on her cough medicine to take with her to [place].  Reviewed GI note   Review Of Systems: per above   GEN- denies fatigue, fever, weight loss,weakness, recent illness HEENT- denies eye drainage, change in vision, nasal discharge, CVS- denies chest pain, palpitations RESP- denies SOB, cough, wheeze ABD- denies N/V, change in stools,+ abd pain Neuro- denies headache, dizziness, syncope, seizure activity       Objective:    BP 124/66 mmHg  Pulse 72  Temp(Src) 98.3 F (36.8 C) (Oral)  Resp 14  Ht 5' 7"  (1.702 m)  Wt 208 lb (94.348 kg)  BMI 32.57 kg/m2 GEN- NAD, alert and oriented x3 HEENT- PERRL, EOMI, non injected sclera, pink conjunctiva, MMM, oropharynx clear CVS- RRR, no murmur RESP-CTAB ABD-NABS-soft, mild TTP epigastric region, no rebound, no guarding, ND, no fluid wave  Skin- mild erythema and papules on nose and cheeks      Assessment & Plan:      Problem List Items Addressed This Visit    Nonalcoholic  steatohepatitis (NASH) - Primary   Relevant Orders   Hepatic Function Panel    Other Visit Diagnoses    Rosacea        Treat with mild cleanser and Metrogel       Note: This dictation was prepared with Dragon dictation along with smaller phrase technology. Any transcriptional errors that result from this process are unintentional.

## 2015-07-26 ENCOUNTER — Ambulatory Visit (INDEPENDENT_AMBULATORY_CARE_PROVIDER_SITE_OTHER): Admitting: Family Medicine

## 2015-07-26 ENCOUNTER — Emergency Department (HOSPITAL_COMMUNITY)

## 2015-07-26 ENCOUNTER — Inpatient Hospital Stay (HOSPITAL_COMMUNITY)
Admission: EM | Admit: 2015-07-26 | Discharge: 2015-07-31 | DRG: 176 | Disposition: A | Discharge status: Home / Self Care | Attending: Family Medicine | Admitting: Family Medicine

## 2015-07-26 ENCOUNTER — Encounter (HOSPITAL_COMMUNITY): Payer: Self-pay | Admitting: Emergency Medicine

## 2015-07-26 ENCOUNTER — Encounter: Payer: Self-pay | Admitting: Family Medicine

## 2015-07-26 VITALS — BP 120/62 | HR 86 | Temp 98.3°F | Resp 14 | Ht 67.0 in | Wt 208.0 lb

## 2015-07-26 DIAGNOSIS — H409 Unspecified glaucoma: Secondary | ICD-10-CM | POA: Diagnosis present

## 2015-07-26 DIAGNOSIS — J189 Pneumonia, unspecified organism: Secondary | ICD-10-CM

## 2015-07-26 DIAGNOSIS — R062 Wheezing: Secondary | ICD-10-CM | POA: Insufficient documentation

## 2015-07-26 DIAGNOSIS — Z87891 Personal history of nicotine dependence: Secondary | ICD-10-CM | POA: Diagnosis not present

## 2015-07-26 DIAGNOSIS — R079 Chest pain, unspecified: Secondary | ICD-10-CM

## 2015-07-26 DIAGNOSIS — N179 Acute kidney failure, unspecified: Secondary | ICD-10-CM | POA: Diagnosis not present

## 2015-07-26 DIAGNOSIS — E7800 Pure hypercholesterolemia, unspecified: Secondary | ICD-10-CM | POA: Diagnosis present

## 2015-07-26 DIAGNOSIS — D573 Sickle-cell trait: Secondary | ICD-10-CM | POA: Diagnosis present

## 2015-07-26 DIAGNOSIS — K5900 Constipation, unspecified: Secondary | ICD-10-CM | POA: Diagnosis not present

## 2015-07-26 DIAGNOSIS — E871 Hypo-osmolality and hyponatremia: Secondary | ICD-10-CM | POA: Diagnosis present

## 2015-07-26 DIAGNOSIS — Z800 Family history of malignant neoplasm of digestive organs: Secondary | ICD-10-CM | POA: Diagnosis not present

## 2015-07-26 DIAGNOSIS — E7401 von Gierke disease: Secondary | ICD-10-CM | POA: Diagnosis present

## 2015-07-26 DIAGNOSIS — T378X5A Adverse effect of other specified systemic anti-infectives and antiparasitics, initial encounter: Secondary | ICD-10-CM | POA: Diagnosis present

## 2015-07-26 DIAGNOSIS — R509 Fever, unspecified: Secondary | ICD-10-CM | POA: Diagnosis not present

## 2015-07-26 DIAGNOSIS — N182 Chronic kidney disease, stage 2 (mild): Secondary | ICD-10-CM | POA: Diagnosis present

## 2015-07-26 DIAGNOSIS — I129 Hypertensive chronic kidney disease with stage 1 through stage 4 chronic kidney disease, or unspecified chronic kidney disease: Secondary | ICD-10-CM | POA: Diagnosis present

## 2015-07-26 DIAGNOSIS — M1990 Unspecified osteoarthritis, unspecified site: Secondary | ICD-10-CM | POA: Diagnosis present

## 2015-07-26 DIAGNOSIS — I2699 Other pulmonary embolism without acute cor pulmonale: Secondary | ICD-10-CM | POA: Diagnosis present

## 2015-07-26 DIAGNOSIS — E669 Obesity, unspecified: Secondary | ICD-10-CM | POA: Diagnosis present

## 2015-07-26 DIAGNOSIS — J9811 Atelectasis: Secondary | ICD-10-CM | POA: Diagnosis present

## 2015-07-26 DIAGNOSIS — R410 Disorientation, unspecified: Secondary | ICD-10-CM | POA: Diagnosis present

## 2015-07-26 DIAGNOSIS — R739 Hyperglycemia, unspecified: Secondary | ICD-10-CM | POA: Diagnosis present

## 2015-07-26 DIAGNOSIS — I10 Essential (primary) hypertension: Secondary | ICD-10-CM | POA: Diagnosis not present

## 2015-07-26 DIAGNOSIS — E876 Hypokalemia: Secondary | ICD-10-CM | POA: Diagnosis present

## 2015-07-26 DIAGNOSIS — Z833 Family history of diabetes mellitus: Secondary | ICD-10-CM

## 2015-07-26 DIAGNOSIS — J9601 Acute respiratory failure with hypoxia: Secondary | ICD-10-CM | POA: Diagnosis present

## 2015-07-26 DIAGNOSIS — R0602 Shortness of breath: Secondary | ICD-10-CM | POA: Diagnosis present

## 2015-07-26 DIAGNOSIS — E6609 Other obesity due to excess calories: Secondary | ICD-10-CM | POA: Diagnosis present

## 2015-07-26 LAB — COMPREHENSIVE METABOLIC PANEL
ALBUMIN: 4.6 g/dL (ref 3.5–5.0)
ALK PHOS: 43 U/L (ref 38–126)
ALT: 47 U/L (ref 14–54)
ANION GAP: 11 (ref 5–15)
AST: 50 U/L — AB (ref 15–41)
BUN: 17 mg/dL (ref 6–20)
CO2: 24 mmol/L (ref 22–32)
Calcium: 10.1 mg/dL (ref 8.9–10.3)
Chloride: 102 mmol/L (ref 101–111)
Creatinine, Ser: 1.06 mg/dL — ABNORMAL HIGH (ref 0.44–1.00)
GFR calc Af Amer: >60 mL/min (ref >60)
GFR calc non Af Amer: 55 mL/min — ABNORMAL LOW (ref >60)
GLUCOSE: 130 mg/dL — AB (ref 65–99)
POTASSIUM: 3.7 mmol/L (ref 3.5–5.1)
SODIUM: 137 mmol/L (ref 135–145)
Total Bilirubin: 1.2 mg/dL (ref 0.3–1.2)
Total Protein: 8 g/dL (ref 6.5–8.1)

## 2015-07-26 LAB — CBC
HCT: 38 % (ref 36.0–46.0)
Hemoglobin: 13.3 g/dL (ref 12.0–15.0)
MCH: 33.2 pg (ref 26.0–34.0)
MCHC: 35 g/dL (ref 30.0–36.0)
MCV: 94.8 fL (ref 78.0–100.0)
PLATELETS: 179 10*3/uL (ref 150–400)
RBC: 4.01 MIL/uL (ref 3.87–5.11)
RDW: 12.1 % (ref 11.5–15.5)
WBC: 9.1 10*3/uL (ref 4.0–10.5)

## 2015-07-26 LAB — I-STAT CHEM 8, ED
BUN: 18 mg/dL (ref 6–20)
CALCIUM ION: 1.26 mmol/L (ref 1.13–1.30)
CHLORIDE: 101 mmol/L (ref 101–111)
Creatinine, Ser: 1.2 mg/dL — ABNORMAL HIGH (ref 0.44–1.00)
Glucose, Bld: 128 mg/dL — ABNORMAL HIGH (ref 65–99)
HEMATOCRIT: 43 % (ref 36.0–46.0)
Hemoglobin: 14.6 g/dL (ref 12.0–15.0)
Potassium: 3.6 mmol/L (ref 3.5–5.1)
SODIUM: 138 mmol/L (ref 135–145)
TCO2: 22 mmol/L (ref 0–100)

## 2015-07-26 LAB — I-STAT TROPONIN, ED: Troponin i, poc: 0 ng/mL (ref 0.00–0.08)

## 2015-07-26 LAB — PROTIME-INR
INR: 1.09 (ref 0.00–1.49)
PROTHROMBIN TIME: 14.3 s (ref 11.6–15.2)

## 2015-07-26 LAB — POCT I-STAT TROPONIN I: Troponin i, poc: 0 ng/mL (ref 0.00–0.08)

## 2015-07-26 MED ORDER — HYDROCHLOROTHIAZIDE 25 MG PO TABS
12.5000 mg | ORAL_TABLET | Freq: Every day | ORAL | Status: DC
  Administered 2015-07-27: 12.5 mg via ORAL
  Filled 2015-07-26: qty 1

## 2015-07-26 MED ORDER — BRIMONIDINE TARTRATE 0.2 % OP SOLN
OPHTHALMIC | Status: AC
  Filled 2015-07-26: qty 5

## 2015-07-26 MED ORDER — CYCLOBENZAPRINE HCL 10 MG PO TABS
10.0000 mg | ORAL_TABLET | Freq: Every day | ORAL | Status: DC
  Administered 2015-07-26 – 2015-07-31 (×6): 10 mg via ORAL
  Filled 2015-07-26 (×6): qty 1

## 2015-07-26 MED ORDER — LATANOPROST 0.005 % OP SOLN
OPHTHALMIC | Status: AC
  Filled 2015-07-26: qty 2.5

## 2015-07-26 MED ORDER — ZOLPIDEM TARTRATE 5 MG PO TABS
5.0000 mg | ORAL_TABLET | Freq: Every day | ORAL | Status: DC
  Administered 2015-07-26 – 2015-07-30 (×5): 5 mg via ORAL
  Filled 2015-07-26 (×5): qty 1

## 2015-07-26 MED ORDER — ONDANSETRON HCL 4 MG/2ML IJ SOLN
4.0000 mg | Freq: Once | INTRAMUSCULAR | Status: AC
  Administered 2015-07-26: 4 mg via INTRAVENOUS
  Filled 2015-07-26: qty 2

## 2015-07-26 MED ORDER — ESCITALOPRAM OXALATE 10 MG PO TABS
10.0000 mg | ORAL_TABLET | Freq: Every day | ORAL | Status: DC
  Administered 2015-07-26 – 2015-07-31 (×6): 10 mg via ORAL
  Filled 2015-07-26 (×5): qty 1

## 2015-07-26 MED ORDER — LATANOPROST 0.005 % OP SOLN
1.0000 [drp] | Freq: Every day | OPHTHALMIC | Status: DC
  Administered 2015-07-27 – 2015-07-30 (×4): 1 [drp] via OPHTHALMIC
  Filled 2015-07-26: qty 2.5

## 2015-07-26 MED ORDER — MORPHINE SULFATE (PF) 4 MG/ML IV SOLN: 4.0000 mg | INTRAVENOUS | Status: DC | PRN

## 2015-07-26 MED ORDER — HEPARIN (PORCINE) IN NACL 100-0.45 UNIT/ML-% IJ SOLN
14.0000 [IU]/kg/h | Freq: Once | INTRAMUSCULAR | Status: AC
  Administered 2015-07-26: 14 [IU]/kg/h via INTRAVENOUS
  Filled 2015-07-26: qty 250

## 2015-07-26 MED ORDER — HEPARIN SODIUM (PORCINE) 5000 UNIT/ML IJ SOLN
4000.0000 [IU] | Freq: Once | INTRAMUSCULAR | Status: AC
  Administered 2015-07-26: 4000 [IU] via INTRAVENOUS
  Filled 2015-07-26: qty 1

## 2015-07-26 MED ORDER — HEPARIN BOLUS VIA INFUSION: 4000.0000 [IU] | Freq: Once | INTRAVENOUS | Status: DC

## 2015-07-26 MED ORDER — ESTRADIOL 0.1 MG/GM VA CREA: 1.0000 | TOPICAL_CREAM | VAGINAL | Status: DC

## 2015-07-26 MED ORDER — HYDROMORPHONE HCL 1 MG/ML IJ SOLN
1.0000 mg | Freq: Once | INTRAMUSCULAR | Status: AC
  Administered 2015-07-26: 1 mg via INTRAVENOUS
  Filled 2015-07-26: qty 1

## 2015-07-26 MED ORDER — ONDANSETRON HCL 4 MG/2ML IJ SOLN
4.0000 mg | Freq: Four times a day (QID) | INTRAMUSCULAR | Status: DC | PRN
  Administered 2015-07-27 – 2015-07-29 (×4): 4 mg via INTRAVENOUS
  Filled 2015-07-26 (×5): qty 2

## 2015-07-26 MED ORDER — SODIUM CHLORIDE 0.9 % IJ SOLN
3.0000 mL | Freq: Two times a day (BID) | INTRAMUSCULAR | Status: DC
  Administered 2015-07-29 – 2015-07-30 (×3): 3 mL via INTRAVENOUS

## 2015-07-26 MED ORDER — SODIUM CHLORIDE 0.9 % IV BOLUS (SEPSIS)
500.0000 mL | Freq: Once | INTRAVENOUS | Status: AC
  Administered 2015-07-26: 500 mL via INTRAVENOUS

## 2015-07-26 MED ORDER — NITROGLYCERIN 0.4 MG/SPRAY TL SOLN
1.0000 | Status: DC | PRN
  Filled 2015-07-26: qty 4.9

## 2015-07-26 MED ORDER — BRIMONIDINE TARTRATE 0.2 % OP SOLN
1.0000 [drp] | Freq: Two times a day (BID) | OPHTHALMIC | Status: DC
  Administered 2015-07-27 – 2015-07-31 (×9): 1 [drp] via OPHTHALMIC
  Filled 2015-07-26: qty 5

## 2015-07-26 MED ORDER — IRBESARTAN 75 MG PO TABS
75.0000 mg | ORAL_TABLET | Freq: Every day | ORAL | Status: DC
  Administered 2015-07-26 – 2015-07-27 (×2): 75 mg via ORAL
  Filled 2015-07-26 (×2): qty 1

## 2015-07-26 MED ORDER — LORATADINE 10 MG PO TABS: 10.0000 mg | ORAL_TABLET | Freq: Every day | ORAL | Status: DC | PRN

## 2015-07-26 MED ORDER — HEPARIN (PORCINE) IN NACL 100-0.45 UNIT/ML-% IJ SOLN: 1250.0000 [IU]/h | INTRAMUSCULAR | Status: DC

## 2015-07-26 MED ORDER — PANTOPRAZOLE SODIUM 40 MG PO TBEC: 40.0000 mg | DELAYED_RELEASE_TABLET | Freq: Every day | ORAL | Status: DC

## 2015-07-26 MED ORDER — SODIUM CHLORIDE 0.9 % IV SOLN
INTRAVENOUS | Status: DC
  Administered 2015-07-27 – 2015-07-28 (×3): via INTRAVENOUS

## 2015-07-26 MED ORDER — PANTOPRAZOLE SODIUM 40 MG PO TBEC
80.0000 mg | DELAYED_RELEASE_TABLET | Freq: Every day | ORAL | Status: DC
  Administered 2015-07-26 – 2015-07-31 (×6): 80 mg via ORAL
  Filled 2015-07-26 (×6): qty 2

## 2015-07-26 MED ORDER — ESTRADIOL 0.1 MG/GM VA CREA
1.0000 | TOPICAL_CREAM | VAGINAL | Status: DC
  Filled 2015-07-26: qty 42.5

## 2015-07-26 MED ORDER — MAGNESIUM OXIDE 400 (241.3 MG) MG PO TABS
800.0000 mg | ORAL_TABLET | Freq: Every day | ORAL | Status: DC
  Administered 2015-07-26 – 2015-07-31 (×6): 800 mg via ORAL
  Filled 2015-07-26 (×7): qty 2

## 2015-07-26 MED ORDER — IBUPROFEN 400 MG PO TABS: 200.0000 mg | ORAL_TABLET | Freq: Four times a day (QID) | ORAL | Status: DC | PRN

## 2015-07-26 MED ORDER — ONDANSETRON 4 MG PO TBDP: 4.0000 mg | ORAL_TABLET | Freq: Three times a day (TID) | ORAL | Status: DC | PRN

## 2015-07-26 MED ORDER — ANTIPYRINE-BENZOCAINE 5.4-1.4 % OT SOLN
3.0000 [drp] | OTIC | Status: DC | PRN
  Filled 2015-07-26: qty 0.2

## 2015-07-26 MED ORDER — ALBUTEROL SULFATE (2.5 MG/3ML) 0.083% IN NEBU
3.0000 mL | INHALATION_SOLUTION | RESPIRATORY_TRACT | Status: DC | PRN
  Administered 2015-07-28 – 2015-07-31 (×5): 3 mL via RESPIRATORY_TRACT
  Filled 2015-07-26 (×5): qty 3

## 2015-07-26 MED ORDER — KRILL OIL 1000 MG PO CAPS: 3000.0000 mg | ORAL_CAPSULE | Freq: Every day | ORAL | Status: DC

## 2015-07-26 MED ORDER — ALPRAZOLAM 1 MG PO TABS
1.0000 mg | ORAL_TABLET | Freq: Two times a day (BID) | ORAL | Status: DC | PRN
  Administered 2015-07-27 – 2015-07-31 (×4): 1 mg via ORAL
  Filled 2015-07-26 (×4): qty 1

## 2015-07-26 MED ORDER — DOCUSATE SODIUM 100 MG PO CAPS
100.0000 mg | ORAL_CAPSULE | Freq: Two times a day (BID) | ORAL | Status: DC
  Administered 2015-07-26 – 2015-07-29 (×6): 100 mg via ORAL
  Filled 2015-07-26 (×6): qty 1

## 2015-07-26 MED ORDER — ONDANSETRON HCL 4 MG PO TABS: 4.0000 mg | ORAL_TABLET | Freq: Four times a day (QID) | ORAL | Status: DC | PRN

## 2015-07-26 MED ORDER — LORAZEPAM 2 MG/ML IJ SOLN
1.0000 mg | Freq: Once | INTRAMUSCULAR | Status: AC
  Administered 2015-07-26: 1 mg via INTRAVENOUS
  Filled 2015-07-26: qty 1

## 2015-07-26 MED ORDER — MORPHINE SULFATE (PF) 4 MG/ML IV SOLN
INTRAVENOUS | Status: AC
  Administered 2015-07-26: 19:00:00
  Filled 2015-07-26: qty 1

## 2015-07-26 MED ORDER — HYDROCOD POLST-CPM POLST ER 10-8 MG/5ML PO SUER
5.0000 mL | Freq: Two times a day (BID) | ORAL | Status: DC | PRN
  Administered 2015-07-28 – 2015-07-30 (×3): 5 mL via ORAL
  Filled 2015-07-26 (×3): qty 5

## 2015-07-26 MED ORDER — IOHEXOL 350 MG/ML SOLN
100.0000 mL | Freq: Once | INTRAVENOUS | Status: AC | PRN
  Administered 2015-07-26: 100 mL via INTRAVENOUS

## 2015-07-26 MED ORDER — FENOFIBRATE 160 MG PO TABS
160.0000 mg | ORAL_TABLET | Freq: Every day | ORAL | Status: DC
  Administered 2015-07-26 – 2015-07-31 (×6): 160 mg via ORAL
  Filled 2015-07-26 (×6): qty 1

## 2015-07-26 MED ORDER — HYDROMORPHONE HCL 1 MG/ML IJ SOLN
1.0000 mg | INTRAMUSCULAR | Status: DC | PRN
  Administered 2015-07-26 – 2015-07-27 (×3): 1 mg via INTRAVENOUS
  Filled 2015-07-26 (×5): qty 1

## 2015-07-26 MED ORDER — FENTANYL CITRATE (PF) 100 MCG/2ML IJ SOLN
25.0000 ug | Freq: Once | INTRAMUSCULAR | Status: AC
  Administered 2015-07-26: 25 ug via INTRAVENOUS
  Filled 2015-07-26: qty 2

## 2015-07-26 MED ORDER — HYDROCORTISONE ACETATE 25 MG RE SUPP: 25.0000 mg | Freq: Two times a day (BID) | RECTAL | Status: DC | PRN

## 2015-07-26 NOTE — Progress Notes (Signed)
Patient ID: [name], female   DOB: 10/26/1950, 64 y.o.   MRN: [id number]   Subjective:    Patient ID: [name], female    DOB: 06-19-1951, 64 y.o.   MRN: [id number]  Patient presents for L Rib Pain  patient here with chest discomfort for the past 3 days. She states on Wednesday she had bilateral lower and some heaviness in the chest. She also had heaviness in her arms she decided that she is quite herself. She did take some aspirin.  Past 2 days her symptoms have been changing and she now has sternal pain and she presses on her chest she has pain deep breaths and tingling in both fingers. She admits to nausea/ no emesis. She has had a mild cough. She thought she had pneumonia so she did not go to ER, Yet she took a  nitroglycerin last night with minimal improvement. She has been taking aspirin the past few days.     Review Of Systems:  GEN- denies fatigue, fever, weight loss,weakness, recent illness HEENT- denies eye drainage, change in vision, nasal discharge, CVS-+ chest pain, denies palpitations RESP- + SOB, +cough, wheeze ABD- denies N/V, change in stools, abd pain GU- denies dysuria, hematuria, dribbling, incontinence MSK- denies joint pain, muscle aches, injury Neuro- denies headache, dizziness, syncope, seizure activity       Objective:    BP 120/62 mmHg  Pulse 86  Temp(Src) 98.3 F (36.8 C) (Oral)  Resp 14  Ht 5' 7"  (1.702 m)  Wt 208 lb (94.348 kg)  BMI 32.57 kg/m2 GEN- NAD, alert and oriented x3 HEENT- PERRL, EOMI, non injected sclera, pink conjunctiva, MMM, oropharynx clear Neck- Supple, no JVD CVS- RRR, no murmur RESP-CTAB ABD-NABS,soft,TTP eigastric region, no gaurding Chest wall- TTP sternum and chest wall bilat, TTP bilat lower ribs, no bruising no ecchymosis  EXT- No edema Pulses- Radial  2+  EKG-NSR, flipped t waves anterolateral leads        Assessment & Plan:      Problem List Items Addressed This Visit    None    Visit Diagnoses     Chest pain, unspecified chest pain type    -  Primary    Chest pain though symptoms are varying from typical Cardic symptoms, with EKG change compared to 2015. She has anxiety which makes things difficult to discern in her. No infectious symptoms, but risk factors for CAD with HTN, hyperlipidemia, liver disease. Discussed need for ER evaluation for cardiac rule out, she wishes to drive to ER. Vitals stable- will call over the [facility] to let charge nurse know about pt    Relevant Orders    EKG 12-Lead (Completed)       Note: This dictation was prepared with Dragon dictation along with smaller phrase technology. Any transcriptional errors that result from this process are unintentional.
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
## 2015-07-26 NOTE — ED Notes (Signed)
Symptoms started Wednesday with cp - has tried to take Nitro with no relief  -- Martin Majestic to MD office Dr Buelah Manis -- EKG performed and T waves noted to be inverted . Pt declined EMs and had friend bring to ER

## 2015-07-26 NOTE — ED Notes (Signed)
Report given to [facility] on Dept 300

## 2015-07-26 NOTE — Patient Instructions (Addendum)
Go directly to ER for evaluation  Abnormal EKG- chest pain x 3 days, took Nitroglycerin last night

## 2015-07-26 NOTE — ED Notes (Signed)
Pt also claims to have had a low grade fever this am of 100.1
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
## 2015-07-27 DIAGNOSIS — R739 Hyperglycemia, unspecified: Secondary | ICD-10-CM

## 2015-07-27 DIAGNOSIS — N182 Chronic kidney disease, stage 2 (mild): Secondary | ICD-10-CM

## 2015-07-27 DIAGNOSIS — I2699 Other pulmonary embolism without acute cor pulmonale: Principal | ICD-10-CM

## 2015-07-27 LAB — COMPREHENSIVE METABOLIC PANEL
ALT: 44 U/L (ref 14–54)
AST: 46 U/L — AB (ref 15–41)
Albumin: 3.9 g/dL (ref 3.5–5.0)
Alkaline Phosphatase: 31 U/L — ABNORMAL LOW (ref 38–126)
Anion gap: 9 (ref 5–15)
BUN: 14 mg/dL (ref 6–20)
CHLORIDE: 100 mmol/L — AB (ref 101–111)
CO2: 25 mmol/L (ref 22–32)
CREATININE: 1.04 mg/dL — AB (ref 0.44–1.00)
Calcium: 9 mg/dL (ref 8.9–10.3)
GFR calc Af Amer: >60 mL/min (ref >60)
GFR calc non Af Amer: 56 mL/min — ABNORMAL LOW (ref >60)
Glucose, Bld: 150 mg/dL — ABNORMAL HIGH (ref 65–99)
Potassium: 3.9 mmol/L (ref 3.5–5.1)
SODIUM: 134 mmol/L — AB (ref 135–145)
Total Bilirubin: 1.1 mg/dL (ref 0.3–1.2)
Total Protein: 7 g/dL (ref 6.5–8.1)

## 2015-07-27 LAB — CBC
HCT: 34.3 % — ABNORMAL LOW (ref 36.0–46.0)
Hemoglobin: 11.5 g/dL — ABNORMAL LOW (ref 12.0–15.0)
MCH: 32.5 pg (ref 26.0–34.0)
MCHC: 33.5 g/dL (ref 30.0–36.0)
MCV: 96.9 fL (ref 78.0–100.0)
PLATELETS: 169 10*3/uL (ref 150–400)
RBC: 3.54 MIL/uL — ABNORMAL LOW (ref 3.87–5.11)
RDW: 11.9 % (ref 11.5–15.5)
WBC: 9.3 10*3/uL (ref 4.0–10.5)

## 2015-07-27 LAB — HEPARIN LEVEL (UNFRACTIONATED)
HEPARIN UNFRACTIONATED: 0.43 [IU]/mL (ref 0.30–0.70)
Heparin Unfractionated: 0.37 IU/mL (ref 0.30–0.70)

## 2015-07-27 MED ORDER — POLYETHYLENE GLYCOL 3350 17 G PO PACK
17.0000 g | PACK | Freq: Two times a day (BID) | ORAL | Status: DC
  Administered 2015-07-27 – 2015-07-30 (×7): 17 g via ORAL
  Filled 2015-07-27 (×9): qty 1

## 2015-07-27 MED ORDER — NITROGLYCERIN 0.4 MG SL SUBL: 0.4000 mg | SUBLINGUAL_TABLET | SUBLINGUAL | Status: DC | PRN

## 2015-07-27 MED ORDER — APIXABAN 5 MG PO TABS
10.0000 mg | ORAL_TABLET | Freq: Two times a day (BID) | ORAL | Status: DC
  Administered 2015-07-27 – 2015-07-31 (×9): 10 mg via ORAL
  Filled 2015-07-27 (×9): qty 2

## 2015-07-27 MED ORDER — ONDANSETRON HCL 4 MG/2ML IJ SOLN
4.0000 mg | Freq: Three times a day (TID) | INTRAMUSCULAR | Status: DC
  Administered 2015-07-27 – 2015-07-30 (×14): 4 mg via INTRAVENOUS
  Filled 2015-07-27 (×14): qty 2

## 2015-07-27 MED ORDER — HYDROMORPHONE HCL 1 MG/ML IJ SOLN
1.0000 mg | Freq: Four times a day (QID) | INTRAMUSCULAR | Status: DC
  Administered 2015-07-27 – 2015-07-31 (×16): 1 mg via INTRAVENOUS
  Filled 2015-07-27 (×14): qty 1

## 2015-07-27 MED ORDER — HYDROCORTISONE-ACETIC ACID 1-2 % OT SOLN
3.0000 [drp] | Freq: Four times a day (QID) | OTIC | Status: DC | PRN
  Filled 2015-07-27: qty 10

## 2015-07-27 MED ORDER — APIXABAN 5 MG PO TABS: 5.0000 mg | ORAL_TABLET | Freq: Two times a day (BID) | ORAL | Status: DC

## 2015-07-27 MED ORDER — ESTRADIOL 0.1 MG/GM VA CREA: 1.0000 | TOPICAL_CREAM | VAGINAL | Status: DC

## 2015-07-27 MED ORDER — HYDROMORPHONE HCL 1 MG/ML IJ SOLN
1.5000 mg | INTRAMUSCULAR | Status: DC | PRN
  Administered 2015-07-27 – 2015-07-29 (×8): 1.5 mg via INTRAVENOUS
  Filled 2015-07-27 (×11): qty 2

## 2015-07-27 MED ORDER — OXYCODONE-ACETAMINOPHEN 5-325 MG PO TABS
1.0000 | ORAL_TABLET | ORAL | Status: DC | PRN
  Administered 2015-07-27 – 2015-07-30 (×3): 1 via ORAL
  Filled 2015-07-27 (×3): qty 1
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
## 2015-07-27 NOTE — Progress Notes (Signed)
[facility] PROGRESS NOTE  [name] XBD:[id number] DOB: [birth year] DOA: 07/26/2015 PCP: [name], MD    Code Status: Full code Family Communication: family not available; discussed with patient  Disposition Plan: discharge when clinically appropriate   Consultants:  None  Procedures:  None  Antibiotics:  None   HPI/Subjective: Patient continues to have pleuritic chest pain and says that the IV Dilaudid was not lasting. Patient denies any recent rectal bleeding, but did have intermittent bleeding a little over 2 months ago. Otherwise, no complaints.  Objective: Filed Vitals:   07/27/15 0610  BP: 101/59  Pulse: 102  Temp: 99.7 F (37.6 C)  Resp: 20   oxygen saturation 97%.   Intake/Output Summary (Last 24 hours) at 07/27/15 1120 Last data filed at 07/27/15 0800  Gross per 24 hour  Intake    840 ml  Output      0 ml  Net    840 ml   Filed Weights   07/26/15 1308 07/26/15 1822  Weight: 90.719 kg (200 lb) 94.439 kg (208 lb 3.2 oz)    Exam:   General:  pleasant obese 64 year old woman in no acute distress.  Cardiovascular: S1, S2, no murmurs rubs or gallops.  Respiratory: mild splinting, otherwise lungs are clear; breathing nonlabored at rest.  Abdomen: positive bowel sounds, soft, nontender, nondistended.  Musculoskeletal/extremities: No acute hot red joints. No pedal edema.  Neurologic: Alert and oriented 3. Cranial nerves II through XII are intact.  Data Reviewed: Basic Metabolic Panel:  Recent Labs Lab 07/26/15 1318 07/26/15 1324 07/27/15 0450  NA 137 138 134*  K 3.7 3.6 3.9  CL 102 101 100*  CO2 24  --  25  GLUCOSE 130* 128* 150*  BUN 17 18 14   CREATININE 1.06* 1.20* 1.04*  CALCIUM 10.1  --  9.0   Liver Function Tests:  Recent Labs Lab 07/26/15 1318 07/27/15 0450  AST 50* 46*  ALT 47 44  ALKPHOS 43 31*  BILITOT 1.2 1.1  PROT 8.0 7.0  ALBUMIN 4.6 3.9   No results for input(s): LIPASE, AMYLASE in the last  168 hours. No results for input(s): AMMONIA in the last 168 hours. CBC:  Recent Labs Lab 07/26/15 1318 07/26/15 1324 07/27/15 0450  WBC 9.1  --  9.3  HGB 13.3 14.6 11.5*  HCT 38.0 43.0 34.3*  MCV 94.8  --  96.9  PLT 179  --  169   Cardiac Enzymes: No results for input(s): CKTOTAL, CKMB, CKMBINDEX, TROPONINI in the last 168 hours. BNP (last 3 results) No results for input(s): BNP in the last 8760 hours.  ProBNP (last 3 results) No results for input(s): PROBNP in the last 8760 hours.  CBG: No results for input(s): GLUCAP in the last 168 hours.  No results found for this or any previous visit (from the past 240 hour(s)).   Studies: Ct Angio Chest Pe W/cm &/or Wo Cm  07/26/2015  CLINICAL DATA:  Chest pain for 3 days. EXAM: CT ANGIOGRAPHY CHEST WITH CONTRAST TECHNIQUE: Multidetector CT imaging of the chest was performed using the standard protocol during bolus administration of intravenous contrast. Multiplanar CT image reconstructions and MIPs were obtained to evaluate the vascular anatomy. CONTRAST:  16m OMNIPAQUE IOHEXOL 350 MG/ML SOLN COMPARISON:  CT scan of November 27, 2013. FINDINGS: No pneumothorax is noted. Minimal bilateral posterior basilar subsegmental atelectasis is noted. Minimal left pleural effusion may be present filling defect is seen in lower lobe branch of the left pulmonary artery concerning for  acute pulmonary embolus. There is no evidence of thoracic aortic dissection or aneurysm. RV/LV ratio is within normal limits at approximately 0.6. No significant osseous abnormality is noted in the chest. Within the visualized portion of the abdomen, fatty infiltration of the liver is noted. Stable anterior mediastinal lymph node is noted. Review of the MIP images confirms the above findings. IMPRESSION: Fatty infiltration of the liver. Minimal bilateral posterior basilar subsegmental atelectasis. Filling defect is seen in lower lobe branch of left pulmonary artery consistent  with acute pulmonary embolus. Critical Value/emergent results were called by telephone at the time of interpretation on 07/26/2015 at 4:22 pm to Dr. Fredia Sorrow , who verbally acknowledged these results. Electronically Signed   By: Marijo Conception, M.D.   On: 07/26/2015 16:23   Dg Chest Portable 1 View  07/26/2015  CLINICAL DATA:  Chest pain EXAM: PORTABLE CHEST 1 VIEW COMPARISON:  10/02/2014 chest radiograph FINDINGS: Low lung volumes. Stable cardiomediastinal silhouette with top-normal heart size. No pneumothorax. No pleural effusion. Clear lungs, with no focal lung consolidation and no pulmonary edema. IMPRESSION: Low lung volumes with no active cardiopulmonary disease. Electronically Signed   By: Ilona Sorrel M.D.   On: 07/26/2015 13:36    Scheduled Meds: . brimonidine  1 drop Both Eyes BID  . cyclobenzaprine  10 mg Oral Daily  . docusate sodium  100 mg Oral BID  . escitalopram  10 mg Oral Daily  . [START ON 08/01/2015] estradiol  1 Applicatorful Vaginal Weekly  . fenofibrate  160 mg Oral Daily  . hydrochlorothiazide  12.5 mg Oral Daily  . irbesartan  75 mg Oral Daily  . latanoprost  1 drop Both Eyes QHS  . magnesium oxide  800 mg Oral Daily  . pantoprazole  80 mg Oral Daily  . sodium chloride  3 mL Intravenous Q12H  . zolpidem  5 mg Oral QHS   Continuous Infusions: . sodium chloride 75 mL/hr at 07/27/15 0044  . heparin     Assessment and plan: Principal Problem:   PE (pulmonary thromboembolism) ([place]) Active Problems:   CKD (chronic kidney disease), stage II   Essential hypertension, benign   Obesity   Essential hypertension   Hyperglycemia   1. Left lower lobe PE. Etiology of PE is unknown, but the patient has been traveling recently via airplane.  -The patient was started on IV heparin on admission. This will be discontinued in favor of starting Eliquis to be dosed per pharmacy. -We'll hold off on ordering hypercoagulable panel in light of heparin being started. -Of  note, patient has a history of peptic ulcer disease, colon polyps, and hemorrhoids. She reported rectal bleeding a little over 2 months ago, but not recently.  History of colon polyps, peptic ulcer disease, and hemorrhoids. Patient has a history of hemorrhoidal bleeding; status post banding in the past. -No evidence of rectal bleeding recently. -We'll continue PPI and Anusol HC Suppository. -GI consulted for recommendations.  Normocytic anemia. The patient's hemoglobin was 13.3 on admission and has fallen to 11.5. No obvious evidence of GI bleeding. The decrease in her hemoglobin is likely dilutional. -As above, will continue PPI. We'll continue to monitor her hemoglobin/hematocrit. We'll continue her PPI. -We'll decrease IV fluids a little.  Essential hypertension. The patient is treated chronically with Avapro and had chlorothiazide. She was restarted on Avapro at half the dose. -We'll discontinue hydrochlorothiazide as her blood pressure is on the lower end of normal. -Continue gentle IV fluids.  Stage II chronic kidney  disease. Patient's creatinine was 1.06 on admission and has ranged from 1.06-1.20. This is likely her baseline. -We'll continue to follow.  Hyperglycemia. The patient has no history of diabetes, but her venous glucose has been modestly elevated. Will order hemoglobin A1c for further evaluation. We'll order sliding scale NovoLog if her venous glucose is consistently above 150.   Time spent: 35 minutes    [facility] Pager [phone]. If 7PM-7AM, please contact night-coverage at [email or web address], password [facility] 07/27/2015, 11:20 AM  LOS: 1 day
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
## 2015-07-27 NOTE — Progress Notes (Signed)
Utilization review Completed Carlisle Beers RN BSN

## 2015-07-28 ENCOUNTER — Inpatient Hospital Stay (HOSPITAL_COMMUNITY)

## 2015-07-28 DIAGNOSIS — N179 Acute kidney failure, unspecified: Secondary | ICD-10-CM | POA: Diagnosis not present

## 2015-07-28 DIAGNOSIS — E871 Hypo-osmolality and hyponatremia: Secondary | ICD-10-CM | POA: Diagnosis not present

## 2015-07-28 DIAGNOSIS — R509 Fever, unspecified: Secondary | ICD-10-CM

## 2015-07-28 DIAGNOSIS — R062 Wheezing: Secondary | ICD-10-CM

## 2015-07-28 DIAGNOSIS — E669 Obesity, unspecified: Secondary | ICD-10-CM

## 2015-07-28 LAB — BASIC METABOLIC PANEL
Anion gap: 9 (ref 5–15)
BUN: 17 mg/dL (ref 6–20)
CHLORIDE: 97 mmol/L — AB (ref 101–111)
CO2: 25 mmol/L (ref 22–32)
CREATININE: 1.63 mg/dL — AB (ref 0.44–1.00)
Calcium: 8.4 mg/dL — ABNORMAL LOW (ref 8.9–10.3)
GFR calc Af Amer: 38 mL/min — ABNORMAL LOW (ref >60)
GFR calc non Af Amer: 33 mL/min — ABNORMAL LOW (ref >60)
Glucose, Bld: 154 mg/dL — ABNORMAL HIGH (ref 65–99)
Potassium: 3.6 mmol/L (ref 3.5–5.1)
Sodium: 131 mmol/L — ABNORMAL LOW (ref 135–145)

## 2015-07-28 LAB — URINALYSIS, ROUTINE W REFLEX MICROSCOPIC
Bilirubin Urine: NEGATIVE
Glucose, UA: NEGATIVE mg/dL
Ketones, ur: NEGATIVE mg/dL
Nitrite: NEGATIVE
PROTEIN: NEGATIVE mg/dL
SPECIFIC GRAVITY, URINE: 1.01 (ref 1.005–1.030)
Urobilinogen, UA: 0.2 mg/dL (ref 0.0–1.0)
pH: 5.5 (ref 5.0–8.0)

## 2015-07-28 LAB — CBC
HEMATOCRIT: 30.5 % — AB (ref 36.0–46.0)
HEMOGLOBIN: 10.2 g/dL — AB (ref 12.0–15.0)
MCH: 32.1 pg (ref 26.0–34.0)
MCHC: 33.4 g/dL (ref 30.0–36.0)
MCV: 95.9 fL (ref 78.0–100.0)
Platelets: 156 10*3/uL (ref 150–400)
RBC: 3.18 MIL/uL — ABNORMAL LOW (ref 3.87–5.11)
RDW: 12.3 % (ref 11.5–15.5)
WBC: 8.8 10*3/uL (ref 4.0–10.5)

## 2015-07-28 LAB — URINE MICROSCOPIC-ADD ON

## 2015-07-28 LAB — CREATININE, URINE, RANDOM: CREATININE, URINE: 105.39 mg/dL

## 2015-07-28 LAB — URIC ACID: URIC ACID, SERUM: 7 mg/dL — AB (ref 2.3–6.6)

## 2015-07-28 LAB — NA AND K (SODIUM & POTASSIUM), RAND UR
Potassium Urine: 14 mmol/L
SODIUM UR: 24 mmol/L

## 2015-07-28 MED ORDER — ACETAMINOPHEN 325 MG PO TABS
650.0000 mg | ORAL_TABLET | Freq: Four times a day (QID) | ORAL | Status: DC | PRN
  Administered 2015-07-28 – 2015-07-30 (×4): 650 mg via ORAL
  Filled 2015-07-28 (×4): qty 2

## 2015-07-28 MED ORDER — LEVALBUTEROL HCL 0.63 MG/3ML IN NEBU
0.6300 mg | INHALATION_SOLUTION | Freq: Four times a day (QID) | RESPIRATORY_TRACT | Status: DC
  Administered 2015-07-28 – 2015-07-29 (×5): 0.63 mg via RESPIRATORY_TRACT
  Filled 2015-07-28 (×6): qty 3

## 2015-07-28 MED ORDER — FUROSEMIDE 10 MG/ML IJ SOLN
20.0000 mg | Freq: Once | INTRAMUSCULAR | Status: AC
  Administered 2015-07-28: 20 mg via INTRAVENOUS
  Filled 2015-07-28: qty 2

## 2015-07-28 MED ORDER — BENZONATATE 100 MG PO CAPS
100.0000 mg | ORAL_CAPSULE | Freq: Three times a day (TID) | ORAL | Status: DC
  Administered 2015-07-28 – 2015-07-31 (×10): 100 mg via ORAL
  Filled 2015-07-28 (×10): qty 1

## 2015-07-28 MED ORDER — LEVOFLOXACIN IN D5W 750 MG/150ML IV SOLN
750.0000 mg | INTRAVENOUS | Status: DC
  Administered 2015-07-28: 750 mg via INTRAVENOUS
  Filled 2015-07-28: qty 150

## 2015-07-28 NOTE — Progress Notes (Signed)
Patient ID: [name], female   DOB: July 01, 1951, 64 y.o.   MRN: [id number]  Assessment/Plan: ADMITTED FOR ACUTE PE. NOW FEBRILE. WORKUP PENDING. NO BRBPR OR MELENA. CREATININE GOING UP.STARTED ON XARELTO YESTERDAY.   PLAN: 1. CONTINUE [place]. 2. CONTINUE TO MONITOR SYMPTOMS.   Subjective: Since I last evaluated the patient she spike d a temp to 102F. Feels like crap. COUGHED UP BLOOD. WHEEZING WORSE. NO BM SINCE ADMISSION. NO BM. GETTING MIRALAX BID AND SENNA.  Objective: Vital signs in last 24 hours: Filed Vitals:   07/28/15 0452  BP:   Pulse:   Temp: 102 F (38.9 C)  Resp:    General appearance: alert, cooperative and mild distress Resp: wheezes anterior - bilateral and bilaterally Cardio: regular rate and rhythm GI: soft, non-tender; bowel sounds normal; no masses,  no organomegaly  Lab Results:  Hb 10.2 Cr 1.63   Studies/Results: No results found.  Medications: I have reviewed the patient's current medications.   LOS: 5 days   Barney Drain 03/15/2014, 2:23 PM

## 2015-07-28 NOTE — Progress Notes (Signed)
[facility] PROGRESS NOTE  [name] KKX:[id number] DOB: 09-13-1951 DOA: 07/26/2015 PCP: [name], MD    Code Status: Full code Family Communication: family not available; discussed with patient  Disposition Plan: discharge when clinically appropriate   Consultants:  None  Procedures:  None  Antibiotics:  Levaquin 10/23   HPI/Subjective: Patient complained of some wheezing and chest congestion this morning. She has less pleuritic chest pain, but does have some shortness of breath. She did have productive cough with mildly blood-tinged sputum. She developed a fever overnight. She denies diarrhea. She does complain of some dysuria.  Objective: Filed Vitals:   07/28/15 1804  BP:   Pulse: 98  Temp: 100.3 F (37.9 C)  Resp: 16   oxygen saturation 95%. Blood pressure 111/58.   Intake/Output Summary (Last 24 hours) at 07/28/15 1805 Last data filed at 07/28/15 1700  Gross per 24 hour  Intake    940 ml  Output    700 ml  Net    240 ml   Filed Weights   07/26/15 1308 07/26/15 1822  Weight: 90.719 kg (200 lb) 94.439 kg (208 lb 3.2 oz)    Exam:   General:  pleasant obese 64 year old woman in no acute distress; she looks a little more fatigued.  Cardiovascular: S1, S2, no murmurs rubs or gallops.  Respiratory: mild splinting, lungs now with mild diffuse wheezes; breathing mildly labored.  Abdomen: positive bowel sounds, soft, nontender, nondistended.  Musculoskeletal/extremities: No acute hot red joints. No pedal edema.  Neurologic: Alert and oriented 3. Cranial nerves II through XII are intact.  Data Reviewed: Basic Metabolic Panel:  Recent Labs Lab 07/26/15 1318 07/26/15 1324 07/27/15 0450 07/28/15 0509  NA 137 138 134* 131*  K 3.7 3.6 3.9 3.6  CL 102 101 100* 97*  CO2 24  --  25 25  GLUCOSE 130* 128* 150* 154*  BUN 17 18 14 17   CREATININE 1.06* 1.20* 1.04* 1.63*  CALCIUM 10.1  --  9.0 8.4*   Liver Function Tests:  Recent  Labs Lab 07/26/15 1318 07/27/15 0450  AST 50* 46*  ALT 47 44  ALKPHOS 43 31*  BILITOT 1.2 1.1  PROT 8.0 7.0  ALBUMIN 4.6 3.9   No results for input(s): LIPASE, AMYLASE in the last 168 hours. No results for input(s): AMMONIA in the last 168 hours. CBC:  Recent Labs Lab 07/26/15 1318 07/26/15 1324 07/27/15 0450 07/28/15 0509  WBC 9.1  --  9.3 8.8  HGB 13.3 14.6 11.5* 10.2*  HCT 38.0 43.0 34.3* 30.5*  MCV 94.8  --  96.9 95.9  PLT 179  --  169 156   Cardiac Enzymes: No results for input(s): CKTOTAL, CKMB, CKMBINDEX, TROPONINI in the last 168 hours. BNP (last 3 results) No results for input(s): BNP in the last 8760 hours.  ProBNP (last 3 results) No results for input(s): PROBNP in the last 8760 hours.  CBG: No results for input(s): GLUCAP in the last 168 hours.  Recent Results (from the past 240 hour(s))  Culture, blood (routine x 2)     Status: None (Preliminary result)   Collection Time: 07/28/15  5:10 AM  Result Value Ref Range Status   Specimen Description BLOOD RIGHT ARM  Final   Special Requests BOTTLES DRAWN AEROBIC AND ANAEROBIC 12CC  Final   Culture NO GROWTH < 12 HOURS  Final   Report Status PENDING  Incomplete  Culture, blood (routine x 2)     Status: None (Preliminary result)  Collection Time: 07/28/15  5:15 AM  Result Value Ref Range Status   Specimen Description BLOOD RIGHT HAND  Final   Special Requests BOTTLES DRAWN AEROBIC AND ANAEROBIC 10CC  Final   Culture NO GROWTH < 12 HOURS  Final   Report Status PENDING  Incomplete     Studies: Dg Chest 2 View  07/28/2015  CLINICAL DATA:  Wheezing.  Asthma.  Fever. EXAM: CHEST - 2 VIEW COMPARISON:  CT 07/26/2015. FINDINGS: The heart is enlarged. Bilateral pleural effusions have increased. Bibasilar airspace disease has progressed as well, left greater than right. Lung volumes are low. Mild pulmonary vascular congestion has increased. IMPRESSION: 1. Cardiomegaly with increasing pulmonary vascular congestion  and bilateral pleural effusions, left greater than right. 2. Bibasilar airspace disease, also worse on the left likely reflects atelectasis. Infection is considered less likely. Electronically Signed   By: San Morelle M.D.   On: 07/28/2015 14:26    Scheduled Meds: . apixaban  10 mg Oral BID  . [START ON 08/03/2015] apixaban  5 mg Oral BID  . benzonatate  100 mg Oral TID  . brimonidine  1 drop Both Eyes BID  . cyclobenzaprine  10 mg Oral Daily  . docusate sodium  100 mg Oral BID  . escitalopram  10 mg Oral Daily  . [START ON 08/01/2015] estradiol  1 Applicatorful Vaginal Weekly  . fenofibrate  160 mg Oral Daily  .  HYDROmorphone (DILAUDID) injection  1 mg Intravenous 4 times per day  . latanoprost  1 drop Both Eyes QHS  . levalbuterol  0.63 mg Nebulization Q6H  . levofloxacin (LEVAQUIN) IV  750 mg Intravenous Q48H  . magnesium oxide  800 mg Oral Daily  . ondansetron (ZOFRAN) IV  4 mg Intravenous TID WC & HS  . pantoprazole  80 mg Oral Daily  . polyethylene glycol  17 g Oral BID  . sodium chloride  3 mL Intravenous Q12H  . zolpidem  5 mg Oral QHS   Continuous Infusions: . sodium chloride 10 mL/hr at 07/28/15 1632   Assessment and plan: Principal Problem:   PE (pulmonary thromboembolism) (HCC) Active Problems:   CKD (chronic kidney disease), stage II   Essential hypertension, benign   Obesity   Essential hypertension   Hyperglycemia   AKI (acute kidney injury) ([place])   Fever, unspecified   Hyponatremia   1. Left lower lobe PE. Etiology of PE is unknown, but the patient has been traveling recently via airplane.  -The patient was started on IV heparin on admission. This was discontinued in favor of starting Eliquis to be dosed per pharmacy. -We'll hold off on ordering hypercoagulable panel in light of heparin being started. -Of note, patient has a history of peptic ulcer disease, colon polyps, and hemorrhoids. She reported rectal bleeding a little over 2 months ago, but  not recently.  Bronchospasm/wheezes. Follow-up chest x-ray reveals cardiomegaly with increasing pulmonary vascular congestion and bilateral pleural effusions; bibasilar airspace disease atelectasis versus infection. The patient was started on Levaquin. Xopenex nebulizers were ordered for bronchospasms. Also ordered 20 mg of IV Lasix 1. IV fluids decreased to KVO. -We'll order 2-D echocardiogram for further evaluation of possible right heart strain or low EF.  Hyponatremia. Patient's serum sodium was within normal limits. She was started on vigorous IV fluids following admission. However, her serum sodium has decreased. Uric acid and urine indices ordered for further evaluation. She was ordered Lasix 21 g IV 1. Normal saline was KVO.  Fever. Patient spiked a  fever of 102 overnight. She reports dysuria, but her urinalysis reveals no nitrite and trace leukocytes. Etiology could be pneumonia versus UTI. Levaquin was started empirically. Blood cultures were ordered overnight and are pending.  History of colon polyps, peptic ulcer disease, and hemorrhoids. Patient has a history of hemorrhoidal bleeding; status post banding in the past. -No evidence of rectal bleeding recently. -We'll continue PPI and Anusol HC Suppository. -GI consulted and made recommendations for MiraLAX and senna.  Normocytic anemia. The patient's hemoglobin was 13.3 on admission and has fallen to 10.2. No obvious evidence of GI bleeding. The decrease in her hemoglobin is likely dilutional. -As above, will continue PPI. We'll continue to monitor her hemoglobin/hematocrit.  -We'll decrease IV fluids a little.  Essential hypertension. The patient is treated chronically with Avapro and hydro-chlorothiazide. They were both started on admission, but in light of her low-normal blood pressure and increasing creatinine, both were discontinued.  Stage II chronic kidney disease. Patient's creatinine was 1.06 on admission. It has  increased to 1.63. As above, IV fluids were titrated down to [facility]. She was given 1 dose of IV Lasix. She denies urinary hesitancy, but does have some dysuria. She states that her urine flow has been good. She did receive IV contrast for evaluation of PE, but it seems too soon for contrast nephropathy to set in. We'll continue to follow her renal function. If it worsens, would order renal ultrasound and consult nephrology. -We'll continue to follow.  Hyperglycemia. The patient has no history of diabetes, but her venous glucose has been modestly elevated. Will order hemoglobin A1c for further evaluation. We'll order sliding scale NovoLog if her venous glucose is consistently above 150.   Time spent: 35 minutes    [facility] [place] [place] Pager [phone]. If 7PM-7AM, please contact night-coverage at [email or web address], password [facility] 07/28/2015, 6:05 PM  LOS: 2 days

## 2015-07-28 NOTE — Progress Notes (Signed)
Patient has a temperature of 102.0, paged the on-call MD, will follow orders received and continue to monitor the patient.

## 2015-07-29 ENCOUNTER — Inpatient Hospital Stay (HOSPITAL_COMMUNITY)

## 2015-07-29 ENCOUNTER — Ambulatory Visit: Payer: Self-pay | Admitting: Family Medicine

## 2015-07-29 DIAGNOSIS — R509 Fever, unspecified: Secondary | ICD-10-CM

## 2015-07-29 DIAGNOSIS — K5900 Constipation, unspecified: Secondary | ICD-10-CM | POA: Insufficient documentation

## 2015-07-29 DIAGNOSIS — N179 Acute kidney failure, unspecified: Secondary | ICD-10-CM

## 2015-07-29 LAB — CBC
HEMATOCRIT: 27.8 % — AB (ref 36.0–46.0)
HEMOGLOBIN: 9.3 g/dL — AB (ref 12.0–15.0)
MCH: 31.7 pg (ref 26.0–34.0)
MCHC: 33.5 g/dL (ref 30.0–36.0)
MCV: 94.9 fL (ref 78.0–100.0)
Platelets: 148 10*3/uL — ABNORMAL LOW (ref 150–400)
RBC: 2.93 MIL/uL — AB (ref 3.87–5.11)
RDW: 12 % (ref 11.5–15.5)
WBC: 7.7 10*3/uL (ref 4.0–10.5)

## 2015-07-29 LAB — BASIC METABOLIC PANEL
Anion gap: 7 (ref 5–15)
BUN: 15 mg/dL (ref 6–20)
CHLORIDE: 93 mmol/L — AB (ref 101–111)
CO2: 29 mmol/L (ref 22–32)
Calcium: 8.5 mg/dL — ABNORMAL LOW (ref 8.9–10.3)
Creatinine, Ser: 1.36 mg/dL — ABNORMAL HIGH (ref 0.44–1.00)
GFR calc non Af Amer: 41 mL/min — ABNORMAL LOW (ref >60)
GFR, EST AFRICAN AMERICAN: 47 mL/min — AB (ref >60)
Glucose, Bld: 125 mg/dL — ABNORMAL HIGH (ref 65–99)
POTASSIUM: 3.6 mmol/L (ref 3.5–5.1)
SODIUM: 129 mmol/L — AB (ref 135–145)

## 2015-07-29 LAB — HEMOGLOBIN A1C
Hgb A1c MFr Bld: 5.2 % (ref 4.8–5.6)
MEAN PLASMA GLUCOSE: 103 mg/dL

## 2015-07-29 LAB — TSH: TSH: 3.9 u[IU]/mL (ref 0.350–4.500)

## 2015-07-29 MED ORDER — FUROSEMIDE 10 MG/ML IJ SOLN
20.0000 mg | Freq: Once | INTRAMUSCULAR | Status: AC
  Administered 2015-07-29: 20 mg via INTRAVENOUS
  Filled 2015-07-29: qty 2

## 2015-07-29 MED ORDER — SENNOSIDES-DOCUSATE SODIUM 8.6-50 MG PO TABS
1.0000 | ORAL_TABLET | Freq: Two times a day (BID) | ORAL | Status: DC
  Administered 2015-07-29 – 2015-07-30 (×2): 1 via ORAL
  Filled 2015-07-29 (×4): qty 1

## 2015-07-29 MED ORDER — LEVALBUTEROL HCL 0.63 MG/3ML IN NEBU: 0.6300 mg | INHALATION_SOLUTION | Freq: Three times a day (TID) | RESPIRATORY_TRACT | Status: DC

## 2015-07-29 NOTE — Progress Notes (Signed)
[facility] PROGRESS NOTE  [name] ZRA:[id number] DOB: 08-04-1951 DOA: 07/26/2015 PCP: [name], MD  Assessment/Plan: 1. Left lower lobe PE. Etiology is unknown, but is reported that she has been traveling via airplane. Started on IV heparin on admission, but discontinued in favor of Eliquis. Will obtain US of legs to rule out DVT. 2. Bronchospasm/wheezes. Follow-up chest x-ray reveals cardiomegaly with increasing pulmonary vascular congestion and bilateral pleural effusions; bibasilar airspace disease atelectasis versus infection. Started on Levaquin, Xopenex nebulizer for bronchospasms and 20 mg of IV Lasix 1, fluids decreased to KVO. Will add another dose of Lasix today. ECHO pending.  Counseled on fluid restriction.  3. Hyponatremia. Appear to be hypervolemic she reports increase free water intake. Will give a dose of lasix and observe. Start on fluid restriction.  Likely related to aggressive 4. Fever. Mtemp of 102 10/23. She reported dysuria, but her urinalysis revealed no nitrite and trace leukocytes. Etiology could be pneumonia versus UTI. Levaquin started empirically. Blood cultures pending. 5.  History of colon polyps, peptic ulcer disease, and hemorrhoids. No evidence of rectal bleeding Will continue PPI and Anusol HC Suppository. GI consulted and recommended MiraLAX and senna. 6. Normocytic anemia.  No evidence of bleeding. Possibly due to dilution by IV hydration.  7. Essential hypertension. Stable continue current treatments.  8. CKD Stage II. Possibly worsened in the setting of aggressive hydration. Fluids have been titrated down to [facility] and she has been given one dose of lasix. Renal function is improving.  9. Hyperglycemia, improved. Patient has no history of diabetes, yet her glucose was mildly elevated upon admission. A1C was within normal limits. Will continue to monitor.   Code Status: Full  DVT prophylaxis: Eliquis  Family Communication: Discussed with  patient who understands and has no concerns at this time. Disposition Plan: Anticipate discharge within 24 hours.   Consultants:    Procedures:    Antibiotics:  Levaquin 10/23>>  HPI/Subjective: Left sided chest pain is still present, although she feels slightly better. Mild productive cough and SOB. Has been able to ambulate but gets weak and winded.   Objective: Filed Vitals:   07/29/15 0554  BP: 116/58  Pulse: 95  Temp: 99.4 F (37.4 C)  Resp: 20    Intake/Output Summary (Last 24 hours) at 07/29/15 1107 Last data filed at 07/29/15 0819  Gross per 24 hour  Intake    940 ml  Output   1850 ml  Net   -910 ml   Filed Weights   07/26/15 1308 07/26/15 1822  Weight: 90.719 kg (200 lb) 94.439 kg (208 lb 3.2 oz)    Exam: General: NAD. Sitting up in bed and looks comfortable Cardiovascular: RRR, S1, S2  Respiratory :Diminshed breath sounds at the bases. No wheezing, rales or rhonchi Abdomen: soft, non tender, no distention , bowel sounds normal Musculoskeletal: RLE appears to be larger then LLE.  Data Reviewed: Basic Metabolic Panel:  Recent Labs Lab 07/26/15 1318 07/26/15 1324 07/27/15 0450 07/28/15 0509 07/29/15 0521  NA 137 138 134* 131* 129*  K 3.7 3.6 3.9 3.6 3.6  CL 102 101 100* 97* 93*  CO2 24  --  25 25 29   GLUCOSE 130* 128* 150* 154* 125*  BUN 17 18 14 17 15   CREATININE 1.06* 1.20* 1.04* 1.63* 1.36*  CALCIUM 10.1  --  9.0 8.4* 8.5*   Liver Function Tests:  Recent Labs Lab 07/26/15 1318 07/27/15 0450  AST 50* 46*  ALT 47 44  ALKPHOS 43 31*  BILITOT 1.2 1.1  PROT 8.0 7.0  ALBUMIN 4.6 3.9   CBC:  Recent Labs Lab 07/26/15 1318 07/26/15 1324 07/27/15 0450 07/28/15 0509 07/29/15 0521  WBC 9.1  --  9.3 8.8 7.7  HGB 13.3 14.6 11.5* 10.2* 9.3*  HCT 38.0 43.0 34.3* 30.5* 27.8*  MCV 94.8  --  96.9 95.9 94.9  PLT 179  --  169 156 148*    Recent Results (from the past 240 hour(s))  Culture, blood (routine x 2)     Status: None  (Preliminary result)   Collection Time: 07/28/15  5:10 AM  Result Value Ref Range Status   Specimen Description BLOOD RIGHT ARM  Final   Special Requests BOTTLES DRAWN AEROBIC AND ANAEROBIC 12CC  Final   Culture NO GROWTH 1 DAY  Final   Report Status PENDING  Incomplete  Culture, blood (routine x 2)     Status: None (Preliminary result)   Collection Time: 07/28/15  5:15 AM  Result Value Ref Range Status   Specimen Description BLOOD RIGHT HAND  Final   Special Requests BOTTLES DRAWN AEROBIC AND ANAEROBIC 10CC  Final   Culture NO GROWTH 1 DAY  Final   Report Status PENDING  Incomplete     Studies: Dg Chest 2 View  07/28/2015  CLINICAL DATA:  Wheezing.  Asthma.  Fever. EXAM: CHEST - 2 VIEW COMPARISON:  CT 07/26/2015. FINDINGS: The heart is enlarged. Bilateral pleural effusions have increased. Bibasilar airspace disease has progressed as well, left greater than right. Lung volumes are low. Mild pulmonary vascular congestion has increased. IMPRESSION: 1. Cardiomegaly with increasing pulmonary vascular congestion and bilateral pleural effusions, left greater than right. 2. Bibasilar airspace disease, also worse on the left likely reflects atelectasis. Infection is considered less likely. Electronically Signed   By: San Morelle M.D.   On: 07/28/2015 14:26    Scheduled Meds: . apixaban  10 mg Oral BID  . [START ON 08/03/2015] apixaban  5 mg Oral BID  . benzonatate  100 mg Oral TID  . brimonidine  1 drop Both Eyes BID  . cyclobenzaprine  10 mg Oral Daily  . escitalopram  10 mg Oral Daily  . [START ON 08/01/2015] estradiol  1 Applicatorful Vaginal Weekly  . fenofibrate  160 mg Oral Daily  .  HYDROmorphone (DILAUDID) injection  1 mg Intravenous 4 times per day  . latanoprost  1 drop Both Eyes QHS  . levalbuterol  0.63 mg Nebulization Q6H  . levofloxacin (LEVAQUIN) IV  750 mg Intravenous Q48H  . magnesium oxide  800 mg Oral Daily  . ondansetron (ZOFRAN) IV  4 mg Intravenous TID WC &  HS  . pantoprazole  80 mg Oral Daily  . polyethylene glycol  17 g Oral BID  . senna-docusate  1 tablet Oral BID  . sodium chloride  3 mL Intravenous Q12H  . zolpidem  5 mg Oral QHS   Continuous Infusions: . sodium chloride 10 mL/hr at 07/28/15 1632    Principal Problem:   PE (pulmonary thromboembolism) ([place]) Active Problems:   CKD (chronic kidney disease), stage II   Essential hypertension, benign   Obesity   Essential hypertension   Hyperglycemia   AKI (acute kidney injury) ([place])   Fever, unspecified   Hyponatremia   Wheezes   Constipation    Time spent:25 minutes    Kathie Dike, MD  [facility] Pager [phone]. If 7PM-7AM, please contact night-coverage at [email or web address], password [facility] 07/29/2015, 11:07 AM  LOS: 3 days  By signing my name below, I, Rennis Harding, attest that this documentation has been prepared under the direction and in the presence of Kathie Dike, MD. Electronically signed: Rennis Harding, Scribe. 07/29/2015 1:05 PM  I, Dr. Kathie Dike, personally performed the services described in this documentaiton. All medical record entries made by the scribe were at my direction and in my presence. I have reviewed the chart and agree that the record reflects my personal performance and is accurate and complete  Kathie Dike, MD, 07/29/2015 1:24 PM

## 2015-07-29 NOTE — Progress Notes (Signed)
Pt is clear but forceably wheezes in her neck.

## 2015-07-29 NOTE — Care Management Note (Signed)
Case Management Note  Patient Details  Name: [name] MRN: [id number] Date of Birth: [birth year]  Subjective/Objective:                  Pt admitted with PE. Pt is from home, lives alone and is ind with ADL's. Pt has no HH services, DME's or med needs prior to admission. Pt's son lives in [place] and pt is planning to go visit Nov 11th.   Action/Plan: Pt plans to return home with self care. Pt will need home O2 assessment. Pt will DC on Eliquis, benefits check results no pre-auth and co-pay is $0 if pt uses mail services for med. Pt will receive to Rx and med voucher for first 30-day free, pt can have first Rx filled at local pharm while mail in Rx being delivered. No further CM needs anticipated.   Expected Discharge Date:  07/28/15               Expected Discharge Plan:  Home/Self Care  In-House Referral:  NA  Discharge planning Services  CM Consult  Post Acute Care Choice:  NA Choice offered to:  NA  DME Arranged:    DME Agency:     HH Arranged:    HH Agency:     Status of Service:  Completed, signed off  Medicare Important Message Given:    Date Medicare IM Given:    Medicare IM give by:    Date Additional Medicare IM Given:    Additional Medicare Important Message give by:     If discussed at [place] of Stay Meetings, dates discussed:    Additional Comments:  Sherald Barge, RN 07/29/2015, 2:29 PM

## 2015-07-29 NOTE — Progress Notes (Signed)
    Subjective: No rectal bleeding. States she hasn't had a BM since last Wednesday although 1 BM is documented in epic for 10/23-10/24. Poor appetite. Chronic left-sided abdominal pain with thorough work-up in past.   Objective: Vital signs in last 24 hours: Temp:  [99.4 F (37.4 C)-101.6 F (38.7 C)] 99.4 F (37.4 C) (10/24 0554) Pulse Rate:  [95-100] 95 (10/24 0554) Resp:  [16-20] 20 (10/24 0554) BP: (106-116)/(55-58) 116/58 mmHg (10/24 0554) SpO2:  [94 %-100 %] 94 % (10/24 0718) Last BM Date: 07/26/15 General:   Alert and oriented, pleasant Head:  Normocephalic and atraumatic. Abdomen:  Bowel sounds present, soft, non-tender, non-distended. Obese.  Msk:  Symmetrical without gross deformities. Normal posture. Neurologic:  Alert and  oriented x4;  grossly normal neurologically. Psych:  Alert and cooperative. Normal mood and affect.  Intake/Output from previous day: 10/23 0701 - 10/24 0700 In: 940 [P.O.:240; I.V.:600; IV Piggyback:100] Out: 1850 [Urine:1850] Intake/Output this shift: Total I/O In: 120 [P.O.:120] Out: -   Lab Results:  Recent Labs  07/26/15 1318 07/26/15 1324 07/27/15 0450 07/28/15 0509  WBC 9.1  --  9.3 8.8  HGB 13.3 14.6 11.5* 10.2*  HCT 38.0 43.0 34.3* 30.5*  PLT 179  --  169 156   BMET  Recent Labs  07/26/15 1318 07/26/15 1324 07/27/15 0450 07/28/15 0509  NA 137 138 134* 131*  K 3.7 3.6 3.9 3.6  CL 102 101 100* 97*  CO2 24  --  25 25  GLUCOSE 130* 128* 150* 154*  BUN 17 18 14 17   CREATININE 1.06* 1.20* 1.04* 1.63*  CALCIUM 10.1  --  9.0 8.4*   LFT  Recent Labs  07/26/15 1318 07/27/15 0450  PROT 8.0 7.0  ALBUMIN 4.6 3.9  AST 50* 46*  ALT 47 44  ALKPHOS 43 31*  BILITOT 1.2 1.1   PT/INR  Recent Labs  07/26/15 1318  LABPROT 14.3  INR 1.09     Studies/Results: Dg Chest 2 View  07/28/2015  CLINICAL DATA:  Wheezing.  Asthma.  Fever. EXAM: CHEST - 2 VIEW COMPARISON:  CT 07/26/2015. FINDINGS: The heart is enlarged.  Bilateral pleural effusions have increased. Bibasilar airspace disease has progressed as well, left greater than right. Lung volumes are low. Mild pulmonary vascular congestion has increased. IMPRESSION: 1. Cardiomegaly with increasing pulmonary vascular congestion and bilateral pleural effusions, left greater than right. 2. Bibasilar airspace disease, also worse on the left likely reflects atelectasis. Infection is considered less likely. Electronically Signed   By: San Morelle M.D.   On: 07/28/2015 14:26    Assessment: 64 year old female admitted with acute PE, followed by GI due to historical rectal bleeding, now resolved, with hemorrhoid banding completed in past. States no BM since last Wednesday, although 1 occurrence is documented in epic in past 24 hours. Continue Miralax BID, change colace to Senokot-S BID.    Chronic kidney disease: worsening creatinine noted yesterday. Repeat BMP ordered for this morning.   Anemia: multifactorial without any evidence of overt GI bleeding.    Plan: Continue Miralax BID Change colace to Senokot BID Repeat BMP, CBC this morning  Continue Protonix 80 mg daily   Orvil Feil, ANP-BC [facility]      LOS: 3 days    07/29/2015, 8:52 AM

## 2015-07-29 NOTE — Discharge Instructions (Signed)
Information on my medicine - ELIQUIS (apixaban)  This medication education was reviewed with me or my healthcare representative as part of my discharge preparation.   Why was Eliquis prescribed for you? Eliquis was prescribed to treat blood clots that may have been found in the veins of your legs (deep vein thrombosis) or in your lungs (pulmonary embolism) and to reduce the risk of them occurring again.  What do You need to know about Eliquis ? The starting dose is 10 mg (two 5 mg tablets) taken TWICE daily for the FIRST SEVEN (7) DAYS, then on 08/03/15  the dose is reduced to ONE 5 mg tablet taken TWICE daily.  Eliquis may be taken with or without food.   Try to take the dose about the same time in the morning and in the evening. If you have difficulty swallowing the tablet whole please discuss with your pharmacist how to take the medication safely.  Take Eliquis exactly as prescribed and DO NOT stop taking Eliquis without talking to the doctor who prescribed the medication.  Stopping may increase your risk of developing a new blood clot.  Refill your prescription before you run out.  After discharge, you should have regular check-up appointments with your healthcare provider that is prescribing your Eliquis.    What do you do if you miss a dose? If a dose of ELIQUIS is not taken at the scheduled time, take it as soon as possible on the same day and twice-daily administration should be resumed. The dose should not be doubled to make up for a missed dose.  Important Safety Information A possible side effect of Eliquis is bleeding. You should call your healthcare provider right away if you experience any of the following: ? Bleeding from an injury or your nose that does not stop. ? Unusual colored urine (red or dark brown) or unusual colored stools (red or black). ? Unusual bruising for unknown reasons. ? A serious fall or if you hit your head (even if there is no bleeding).  Some  medicines may interact with Eliquis and might increase your risk of bleeding or clotting while on Eliquis. To help avoid this, consult your healthcare provider or pharmacist prior to using any new prescription or non-prescription medications, including herbals, vitamins, non-steroidal anti-inflammatory drugs (NSAIDs) and supplements.  This website has more information on Eliquis (apixaban): http://www.eliquis.com/eliquis/home

## 2015-07-30 DIAGNOSIS — J189 Pneumonia, unspecified organism: Secondary | ICD-10-CM

## 2015-07-30 DIAGNOSIS — J9601 Acute respiratory failure with hypoxia: Secondary | ICD-10-CM | POA: Diagnosis present

## 2015-07-30 LAB — BASIC METABOLIC PANEL
Anion gap: 10 (ref 5–15)
BUN: 13 mg/dL (ref 6–20)
CO2: 27 mmol/L (ref 22–32)
CREATININE: 1.21 mg/dL — AB (ref 0.44–1.00)
Calcium: 8.9 mg/dL (ref 8.9–10.3)
Chloride: 95 mmol/L — ABNORMAL LOW (ref 101–111)
GFR calc Af Amer: 54 mL/min — ABNORMAL LOW (ref >60)
GFR, EST NON AFRICAN AMERICAN: 47 mL/min — AB (ref >60)
GLUCOSE: 97 mg/dL (ref 65–99)
POTASSIUM: 4.1 mmol/L (ref 3.5–5.1)
SODIUM: 132 mmol/L — AB (ref 135–145)

## 2015-07-30 LAB — CBC
HEMATOCRIT: 28.2 % — AB (ref 36.0–46.0)
Hemoglobin: 9.7 g/dL — ABNORMAL LOW (ref 12.0–15.0)
MCH: 32.2 pg (ref 26.0–34.0)
MCHC: 34.4 g/dL (ref 30.0–36.0)
MCV: 93.7 fL (ref 78.0–100.0)
PLATELETS: 178 10*3/uL (ref 150–400)
RBC: 3.01 MIL/uL — ABNORMAL LOW (ref 3.87–5.11)
RDW: 12 % (ref 11.5–15.5)
WBC: 6.8 10*3/uL (ref 4.0–10.5)

## 2015-07-30 LAB — HEMOGLOBIN A1C
HEMOGLOBIN A1C: 5.4 % (ref 4.8–5.6)
MEAN PLASMA GLUCOSE: 108 mg/dL

## 2015-07-30 LAB — URINE CULTURE

## 2015-07-30 MED ORDER — LEVOFLOXACIN IN D5W 750 MG/150ML IV SOLN
750.0000 mg | INTRAVENOUS | Status: DC
  Administered 2015-07-30: 750 mg via INTRAVENOUS
  Filled 2015-07-30: qty 150

## 2015-07-30 MED ORDER — SALINE SPRAY 0.65 % NA SOLN: 1.0000 | NASAL | Status: DC | PRN

## 2015-07-30 NOTE — Progress Notes (Signed)
Temperature 98.7.  Patient reports feeling beter.  Will continue to monitor.

## 2015-07-30 NOTE — Progress Notes (Addendum)
[facility] PROGRESS NOTE  MAEDELL HEDGER UMP:[id number] DOB: 25-Jul-1951 DOA: 07/26/2015 PCP: [name], MD   Summary  64 year old female with history of CKD sickle cell trait and G6PD deficiency and colon polyps, peptic ulcer disease, and hemorrhoids presented with complaints of pleuritic left-sided chest pain with associated dyspnea. Chest pain found to be related to left lower lobe PE, which is being treated with Eliquis. She had developed fevers with concern for developing pneumonia. She has been started on antibiotics. If remains afebrile, anticipate discharge home in the next 24 hours..   Assessment/Plan: 1. Left lower lobe PE. Etiology is unknown, but is reported that she has been traveling via airplane. Started on IV heparin on admission, but discontinued in favor of Eliquis. US Venous lower bilateral negative for DVT. She will need at least 6 months of treatment. She was seen by GI for history of rectal bleeding and concerns for starting anticoagulation. She has not had any evidence of GI bleeding at this time and GI has signed off. 2. Bronchospasm/wheezes. Follow-up chest x-ray 10/23 revealed cardiomegaly with increasing pulmonary vascular congestion and bilateral pleural effusions; bibasilar airspace disease atelectasis versus infection. Started on Levaquin, Xopenex nebulizer for bronchospasms and 20 mg of IV Lasix 1, fluids decreased to KVO. Another dose of Lasix given 10/24 with excellent diuresis. ECHO shows normal EF.   3. Hyponatremia, improving. Appear to be hypervolemic. She reported increase free water intake. Given two doses of lasix and placed on fluid restriction. Sodium is now improving. 4. Fever. Mtemp of 102 10/23. She continues to have low grade fever today. Etiology could be pneumonia versus UTI. Levaquin started empirically. Blood cultures have shown no growth to date. Continue IV antibiotics for now. Transition to po antibiotics tomorrow if remains  afebrile. 5.  History of colon polyps, peptic ulcer disease, and hemorrhoids. No evidence of rectal bleeding Will continue PPI and Anusol HC Suppository. GI recommendations appreciated. 6. Normocytic anemia. Hgb 9.7. No evidence of bleeding. Possibly due to dilution by IV hydration. Will continue to monitor. Currently is stable 7. Essential hypertension. Stable continue current treatments. ARB/HCTZ currently on hold. 8. AKI on CKD Stage II. Possibly worsened in the setting of aggressive hydration and volume overload. Fluids have been decreased to [facility] and she has been given two doses of lasix. Renal function continues to  improve. Continue to monitor 9. Hyperglycemia, resolved. Patient has no history of diabetes, yet her glucose was mildly elevated upon admission. A1C was within normal limits. Will continue to monitor.   Code Status: Full  DVT prophylaxis: Eliquis  Family Communication: Discussed with patient who understands and has no concerns at this time. Disposition Plan: Anticipate discharge within 24 hours if remains without fever.   Consultants:  Gastroenterology  Procedures:  Echo: Study Conclusions  - Left ventricle: The cavity size was normal. Wall thickness was normal. Systolic function was normal. The estimated ejection fraction was in the range of 60% to 65%. Wall motion was normal; there were no regional wall motion abnormalities. - Aortic valve: Mildly calcified annulus. Trileaflet. - Mitral valve: Mildly calcified annulus. - Systemic veins: IVC mildly dilated with normal respiratory variation. Estimated CVP 8 mmHg.  Antibiotics:  Levaquin 10/23>>  HPI/Subjective: Continues to have low grade fever. She is very concerned about this. She feels breathing is improving. She is have regular bowel movements. Had some epistaxis overnight, now resolved.  Objective: Filed Vitals:   07/30/15 0510  BP: 124/94  Pulse: 89  Temp: 98.8 F (  37.1 C)  Resp: 20     Intake/Output Summary (Last 24 hours) at 07/30/15 1043 Last data filed at 07/30/15 0847  Gross per 24 hour  Intake 1303.83 ml  Output      0 ml  Net 1303.83 ml   Filed Weights   07/26/15 1308 07/26/15 1822  Weight: 90.719 kg (200 lb) 94.439 kg (208 lb 3.2 oz)    Exam:  General: NAD, looks comfortable Cardiovascular: RRR, S1, S2  Respiratory: clear bilaterally, No wheezing, rales or rhonchi Abdomen: soft, non tender, no distention , bowel sounds normal Musculoskeletal: No edema b/l  Data Reviewed: Basic Metabolic Panel:  Recent Labs Lab 07/26/15 1318 07/26/15 1324 07/27/15 0450 07/28/15 0509 07/29/15 0521 07/30/15 0523  NA 137 138 134* 131* 129* 132*  K 3.7 3.6 3.9 3.6 3.6 4.1  CL 102 101 100* 97* 93* 95*  CO2 24  --  25 25 29 27   GLUCOSE 130* 128* 150* 154* 125* 97  BUN 17 18 14 17 15 13   CREATININE 1.06* 1.20* 1.04* 1.63* 1.36* 1.21*  CALCIUM 10.1  --  9.0 8.4* 8.5* 8.9   Liver Function Tests:  Recent Labs Lab 07/26/15 1318 07/27/15 0450  AST 50* 46*  ALT 47 44  ALKPHOS 43 31*  BILITOT 1.2 1.1  PROT 8.0 7.0  ALBUMIN 4.6 3.9   CBC:  Recent Labs Lab 07/26/15 1318 07/26/15 1324 07/27/15 0450 07/28/15 0509 07/29/15 0521 07/30/15 0523  WBC 9.1  --  9.3 8.8 7.7 6.8  HGB 13.3 14.6 11.5* 10.2* 9.3* 9.7*  HCT 38.0 43.0 34.3* 30.5* 27.8* 28.2*  MCV 94.8  --  96.9 95.9 94.9 93.7  PLT 179  --  169 156 148* 178    Recent Results (from the past 240 hour(s))  Culture, blood (routine x 2)     Status: None (Preliminary result)   Collection Time: 07/28/15  5:10 AM  Result Value Ref Range Status   Specimen Description BLOOD RIGHT ARM  Final   Special Requests BOTTLES DRAWN AEROBIC AND ANAEROBIC 12CC  Final   Culture NO GROWTH 1 DAY  Final   Report Status PENDING  Incomplete  Culture, blood (routine x 2)     Status: None (Preliminary result)   Collection Time: 07/28/15  5:15 AM  Result Value Ref Range Status   Specimen Description BLOOD RIGHT HAND   Final   Special Requests BOTTLES DRAWN AEROBIC AND ANAEROBIC 10CC  Final   Culture NO GROWTH 1 DAY  Final   Report Status PENDING  Incomplete  Urine culture     Status: None   Collection Time: 07/28/15  2:25 PM  Result Value Ref Range Status   Specimen Description URINE, RANDOM  Final   Special Requests NONE  Final   Culture   Final    MULTIPLE SPECIES PRESENT, SUGGEST RECOLLECTION Performed at [facility]    Report Status 07/30/2015 FINAL  Final     Studies: Dg Chest 2 View  07/28/2015  CLINICAL DATA:  Wheezing.  Asthma.  Fever. EXAM: CHEST - 2 VIEW COMPARISON:  CT 07/26/2015. FINDINGS: The heart is enlarged. Bilateral pleural effusions have increased. Bibasilar airspace disease has progressed as well, left greater than right. Lung volumes are low. Mild pulmonary vascular congestion has increased. IMPRESSION: 1. Cardiomegaly with increasing pulmonary vascular congestion and bilateral pleural effusions, left greater than right. 2. Bibasilar airspace disease, also worse on the left likely reflects atelectasis. Infection is considered less likely. Electronically Signed   By:  San Morelle M.D.   On: 07/28/2015 14:26   US Venous Img Lower Bilateral  07/29/2015  CLINICAL DATA:  Pulmonary embolism of unknown origin EXAM: BILATERAL LOWER EXTREMITY VENOUS DOPPLER ULTRASOUND TECHNIQUE: Gray-scale sonography with graded compression, as well as color Doppler and duplex ultrasound were performed to evaluate the lower extremity deep venous systems from the level of the common femoral vein and including the common femoral, femoral, profunda femoral, popliteal and calf veins including the posterior tibial, peroneal and gastrocnemius veins when visible. The superficial great saphenous vein was also interrogated. Spectral Doppler was utilized to evaluate flow at rest and with distal augmentation maneuvers in the common femoral, femoral and popliteal veins. COMPARISON:  None. FINDINGS: RIGHT  LOWER EXTREMITY Common Femoral Vein: No evidence of thrombus. Normal compressibility, respiratory phasicity and response to augmentation. Saphenofemoral Junction: No evidence of thrombus. Normal compressibility and flow on color Doppler imaging. Profunda Femoral Vein: No evidence of thrombus. Normal compressibility and flow on color Doppler imaging. Femoral Vein: No evidence of thrombus. Normal compressibility, respiratory phasicity and response to augmentation. Popliteal Vein: No evidence of thrombus. Normal compressibility, respiratory phasicity and response to augmentation. Calf Veins: No evidence of thrombus. Normal compressibility and flow on color Doppler imaging. Superficial Great Saphenous Vein: No evidence of thrombus. Normal compressibility and flow on color Doppler imaging. Venous Reflux:  None. Other Findings:  None. LEFT LOWER EXTREMITY Common Femoral Vein: No evidence of thrombus. Normal compressibility, respiratory phasicity and response to augmentation. Saphenofemoral Junction: No evidence of thrombus. Normal compressibility and flow on color Doppler imaging. Profunda Femoral Vein: No evidence of thrombus. Normal compressibility and flow on color Doppler imaging. Femoral Vein: No evidence of thrombus. Normal compressibility, respiratory phasicity and response to augmentation. Popliteal Vein: No evidence of thrombus. Normal compressibility, respiratory phasicity and response to augmentation. Calf Veins: No evidence of thrombus. Normal compressibility and flow on color Doppler imaging. Superficial Great Saphenous Vein: No evidence of thrombus. Normal compressibility and flow on color Doppler imaging. Venous Reflux:  None. Other Findings:  None. IMPRESSION: No evidence of deep venous thrombosis. Electronically Signed   By: Inez Catalina M.D.   On: 07/29/2015 14:51    Scheduled Meds: . apixaban  10 mg Oral BID  . [START ON 08/03/2015] apixaban  5 mg Oral BID  . benzonatate  100 mg Oral TID  .  brimonidine  1 drop Both Eyes BID  . cyclobenzaprine  10 mg Oral Daily  . escitalopram  10 mg Oral Daily  . [START ON 08/01/2015] estradiol  1 Applicatorful Vaginal Weekly  . fenofibrate  160 mg Oral Daily  .  HYDROmorphone (DILAUDID) injection  1 mg Intravenous 4 times per day  . latanoprost  1 drop Both Eyes QHS  . levalbuterol  0.63 mg Nebulization TID  . levofloxacin (LEVAQUIN) IV  750 mg Intravenous Q24H  . magnesium oxide  800 mg Oral Daily  . ondansetron (ZOFRAN) IV  4 mg Intravenous TID WC & HS  . pantoprazole  80 mg Oral Daily  . polyethylene glycol  17 g Oral BID  . senna-docusate  1 tablet Oral BID  . sodium chloride  3 mL Intravenous Q12H  . zolpidem  5 mg Oral QHS   Continuous Infusions: . sodium chloride Stopped (07/29/15 1415)    Principal Problem:   PE (pulmonary thromboembolism) ([place]) Active Problems:   CKD (chronic kidney disease), stage II   Essential hypertension, benign   Obesity   Essential hypertension   Hyperglycemia   AKI (  acute kidney injury) ([place])   Fever, unspecified   Hyponatremia   Wheezes   Constipation    Time spent: 25 minutes    Kathie Dike, MD  [facility] Pager [phone]. If 7PM-7AM, please contact night-coverage at [email or web address], password [facility] 07/30/2015, 10:43 AM  LOS: 4 days

## 2015-07-30 NOTE — Progress Notes (Signed)
Patient feeling warm per her report.  Temp 100.4.  Reports feeling bad with temp.  Tylenol 63m PO given.  Will continue to monitor temperature.  IV Levaquin infusing.

## 2015-07-30 NOTE — Progress Notes (Signed)
Eliquis education done, questions answered.    Pt has requested MD write a prescription for a 90 days supply of Eliquis when discharged as patient is planning on traveling for the upcoming holidays.  Hart Robinsons, PharmD 07/30/15

## 2015-07-30 NOTE — Progress Notes (Signed)
Patient in bathroom at medication time, returned and pharmacist in room consulting with patient.  Medications given after scheduled time.

## 2015-07-31 ENCOUNTER — Telehealth: Payer: Self-pay | Admitting: Family Medicine

## 2015-07-31 LAB — CBC
HCT: 30.4 % — ABNORMAL LOW (ref 36.0–46.0)
HEMOGLOBIN: 10.2 g/dL — AB (ref 12.0–15.0)
MCH: 31.7 pg (ref 26.0–34.0)
MCHC: 33.6 g/dL (ref 30.0–36.0)
MCV: 94.4 fL (ref 78.0–100.0)
Platelets: 226 10*3/uL (ref 150–400)
RBC: 3.22 MIL/uL — AB (ref 3.87–5.11)
RDW: 11.9 % (ref 11.5–15.5)
WBC: 7.1 10*3/uL (ref 4.0–10.5)

## 2015-07-31 LAB — BASIC METABOLIC PANEL
ANION GAP: 10 (ref 5–15)
BUN: 12 mg/dL (ref 6–20)
CALCIUM: 9.2 mg/dL (ref 8.9–10.3)
CO2: 28 mmol/L (ref 22–32)
Chloride: 96 mmol/L — ABNORMAL LOW (ref 101–111)
Creatinine, Ser: 1.13 mg/dL — ABNORMAL HIGH (ref 0.44–1.00)
GFR, EST AFRICAN AMERICAN: 59 mL/min — AB (ref >60)
GFR, EST NON AFRICAN AMERICAN: 51 mL/min — AB (ref >60)
Glucose, Bld: 101 mg/dL — ABNORMAL HIGH (ref 65–99)
Potassium: 3.4 mmol/L — ABNORMAL LOW (ref 3.5–5.1)
SODIUM: 134 mmol/L — AB (ref 135–145)

## 2015-07-31 MED ORDER — OXYCODONE-ACETAMINOPHEN 5-325 MG PO TABS: 1.0000 | ORAL_TABLET | Freq: Three times a day (TID) | ORAL | Status: DC | PRN

## 2015-07-31 MED ORDER — APIXABAN 5 MG PO TABS: ORAL_TABLET | ORAL | Status: DC

## 2015-07-31 MED ORDER — APIXABAN 5 MG PO TABS: 5.0000 mg | ORAL_TABLET | Freq: Two times a day (BID) | ORAL | Status: DC

## 2015-07-31 MED ORDER — POTASSIUM CHLORIDE CRYS ER 20 MEQ PO TBCR
40.0000 meq | EXTENDED_RELEASE_TABLET | Freq: Once | ORAL | Status: AC
  Administered 2015-07-31: 40 meq via ORAL
  Filled 2015-07-31: qty 2

## 2015-07-31 MED ORDER — LEVOFLOXACIN 750 MG PO TABS
750.0000 mg | ORAL_TABLET | Freq: Every day | ORAL | Status: DC
  Administered 2015-07-31: 750 mg via ORAL
  Filled 2015-07-31: qty 1

## 2015-07-31 NOTE — Discharge Summary (Signed)
Physician Discharge Summary  [name] JYN:[id number] DOB: [birth year] DOA: 07/26/2015  PCP: [name], MD  Admit date: 07/26/2015 Discharge date: 07/31/2015  Recommendations for Outpatient Follow-up:  1. Follow up new PE 1 week   Follow-up Information    Follow up with [name], MD. Schedule an appointment as soon as possible for a visit in 1 week.   Specialty:  Family Medicine   Contact information:   [address] [place] [address] [phone]      Discharge Diagnoses:  1. Left lower lobe PE.   2. Atelectasis, bronchospasm 3. Hyponatremia. 4. Hypokalemia. 5. History of colon polyps, peptic ulcer disease, and hemorrhoids. 6. Normocytic anemia. 7. Essential hypertension. 8. 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  Discharge Condition: Improved Disposition: Home  Diet recommendation: Heart-healthy  Filed Weights   07/26/15 1308 07/26/15 1822  Weight: 90.719 kg (200 lb) 94.439 kg (208 lb 3.2 oz)    History of present illness:  42 yof with a hx of G6PD deficiency and sickle cell trait presented with pleuritic left sided chest pain and dyspnea. While in the ED, CTA revealed a left sided PE. She was admitted for further management.   Hospital Course:  Left lower lobe PE was treated with Eliquis. Bilateral LE [place] was negative for DVT. She was educated that she will need atleast 6 months of treatment. Bronchospasms and wheezes resolved with treatment. Likely secondary to asthma. ECHO revealed normal EF. Hyponatremia and hypokalemia were repleted. Fever of unknown origin, possibly PNA, resolved. Patient has been afebrile for >48 hours upon discharge. UC was negative. BC negative to date however final results are still pending. AKI superimposed on CKD stage II resolved. Creatinine appears to be back to baseline. All other issues remained stable.  Individual issues as below:  1. Acute left lower lobe PE. Started on IV heparin on admission, but  discontinued in favor of Eliquis. US Venous lower bilateral negative for DVT. She will need at least 6 months of treatment. She was seen by GI for history of rectal bleeding and concerns for starting anticoagulation. She has not had any evidence of GI bleeding at this time and GI has signed off. 2. Bronchospasm/wheezes. Follow-up chest x-ray 10/23 revealed cardiomegaly with increasing pulmonary vascular congestion and bilateral pleural effusions; favor atelectasis.  3. Acute confusion this AM. Resolved. Possibly related to Levaquin. Alert and oriented. No hallucinations or SI. Patient wants to go home.  4. Hyponatremia, essentially resolved following Lasix and fluid restriction.  5. Hypokalemia, repleted. 6. Fever, resolved. Max temp of 102 on 10/23.no evidence of pneumonia. Favor atelectasis. UC is negative. Blood cultures have shown no growth to date.  7. History of colon polyps, peptic ulcer disease, and hemorrhoids. No evidence of rectal bleeding Will continue PPI and Anusol HC Suppository. GI recommendations appreciated. 8. Normocytic anemia. Hgb 10.2. No evidence of bleeding. Possibly due to dilution by IV hydration. Will continue to monitor. Currently is stable 9. Essential hypertension, stable. Continue home medications.  10. 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   Consultants:  Gastroenterology  Procedures:  Echo: Study Conclusions  - Left ventricle: The cavity size was normal. Wall thickness was normal. Systolic function was normal. The estimated ejection fraction was in the range of 60% to 65%. Wall motion was normal; there were no regional wall motion abnormalities. - Aortic valve: Mildly calcified annulus. Trileaflet. - Mitral valve: Mildly calcified annulus. - Systemic veins: IVC mildly dilated with normal respiratory variation. Estimated  CVP 8 mmHg.  Antibiotics:  Levaquin 10/23>>10/26  Discharge Instructions   Discharge Medication List  as of 07/31/2015  2:17 PM    START taking these medications   Details  oxyCODONE-acetaminophen (PERCOCET/ROXICET) 5-325 MG tablet Take 1 tablet by mouth every 8 (eight) hours as needed for severe pain., Starting 07/31/2015, Until Discontinued, Print      CONTINUE these medications which have CHANGED   Details  !! apixaban (ELIQUIS) 5 MG TABS tablet Take 10 mg by mouth twice daily with last dose 10/28 in the evening. Then take 5 mg by mouth twice daily starting 10/29 in the morning., Print    !! apixaban (ELIQUIS) 5 MG TABS tablet Take 1 tablet (5 mg total) by mouth 2 (two) times daily. This 90 day supply should be started after completing prescription for 30day supply, Starting 08/03/2015, Until Discontinued, Normal     !! - Potential duplicate medications found. Please discuss with provider.    CONTINUE these medications which have NOT CHANGED   Details  ALPRAZolam (XANAX) 1 MG tablet Take 1 tablet (1 mg total) by mouth 2 (two) times daily as needed for anxiety., Starting 04/22/2015, Until Discontinued, Print    antipyrine-benzocaine (AURALGAN) otic solution Place 3-4 drops into the right ear every 2 (two) hours as needed for ear pain., Starting 03/22/2014, Until Discontinued, Normal    brimonidine (ALPHAGAN) 0.2 % ophthalmic solution Place 1 drop into both eyes 2 (two) times daily., Starting 04/22/2015, Until Discontinued, Print    cyclobenzaprine (FLEXERIL) 10 MG tablet Take 1 tablet (10 mg total) by mouth daily., Starting 07/18/2015, Until Discontinued, Normal    dexlansoprazole (DEXILANT) 60 MG capsule Take 1 capsule (60 mg total) by mouth daily., Starting 07/02/2015, Until Discontinued, Print    docusate sodium (COLACE) 100 MG capsule Take 100 mg by mouth 2 (two) times daily., Until Discontinued, Historical Med    escitalopram (LEXAPRO) 10 MG tablet Take 1 tablet (10 mg total) by mouth daily., Starting 02/12/2015, Until Discontinued, Normal    estradiol (ESTRACE VAGINAL) 0.1 MG/GM  vaginal cream Place 1 Applicatorful vaginally 3 (three) times a week., Starting 01/23/2015, Until Discontinued, Print    fenofibrate (TRICOR) 145 MG tablet Take 1 tablet (145 mg total) by mouth daily., Starting 04/22/2015, Until Discontinued, Print    hydrochlorothiazide (HYDRODIURIL) 12.5 MG tablet Take 1 tablet (12.5 mg total) by mouth daily., Starting 04/22/2015, Until Discontinued, Print    irbesartan (AVAPRO) 150 MG tablet Take 0.5 tablets (75 mg total) by mouth daily., Starting 05/06/2015, Until Discontinued, Normal    KRILL OIL PO Take 3,000 mg by mouth daily. , Until Discontinued, Historical Med    latanoprost (XALATAN) 0.005 % ophthalmic solution Place 1 drop into both eyes at bedtime., Starting 05/06/2015, Until Discontinued, Print    MAGNESIUM PO Take 800 mg by mouth daily. , Until Discontinued, Historical Med    metroNIDAZOLE (METROGEL) 0.75 % gel Apply 1 application topically daily., Starting 07/19/2015, Until Discontinued, Print    Probiotic Product (HEALTHY COLON PO) Take 1 tablet by mouth daily. , Until Discontinued, Historical Med    zolpidem (AMBIEN) 10 MG tablet TAKE ONE TABLET BY MOUTH DAILY AT BEDTIME., Print    albuterol (PROVENTIL HFA;VENTOLIN HFA) 108 (90 BASE) MCG/ACT inhaler Inhale 2 puffs into the lungs every 4 (four) hours as needed for wheezing., Starting 03/22/2014, Until Discontinued, Normal    hydrocortisone (ANUSOL-HC) 25 MG suppository Place 1 suppository (25 mg total) rectally 2 (two) times daily as needed for hemorrhoids. For 12 days, Starting  03/22/2014, Until Discontinued, Normal    loratadine (CLARITIN) 10 MG tablet Take 10 mg by mouth daily as needed for allergies. , Until Discontinued, Historical Med    nitroGLYCERIN (NITROLINGUAL) 0.4 MG/SPRAY spray Place 1 spray under the tongue every 5 (five) minutes as needed for chest pain., Starting 03/22/2014, Until Discontinued, Normal    ondansetron (ZOFRAN ODT) 4 MG disintegrating tablet Take 1 tablet (4 mg total)  by mouth every 8 (eight) hours as needed for nausea or vomiting., Starting 04/22/2015, Until Discontinued, Normal    OVER THE COUNTER MEDICATION Take 1 tablet by mouth every 6 (six) hours as needed (pain). Acetaminophen/Phenylephrine 325/5 as needed, Until Discontinued, Historical Med      STOP taking these medications     chlorpheniramine-HYDROcodone (TUSSIONEX PENNKINETIC ER) 10-8 MG/5ML SUER      lansoprazole (PREVACID) 30 MG capsule      ibuprofen (ADVIL,MOTRIN) 200 MG tablet        Allergies  Allergen Reactions  . Adhesive [Tape] Other (See Comments)    Blisters skin  . Aspirin Other (See Comments)    sickle cell trait--  Not recommended unless emergency  . Penicillins   . Statins Other (See Comments)    Myopathy - elevated CK  . Sulfonamide Derivatives Other (See Comments)    Patient has sickle cell trait  . Latex Rash    The results of significant diagnostics from this hospitalization (including imaging, microbiology, ancillary and laboratory) are listed below for reference.    Significant Diagnostic Studies: Dg Chest 2 View  07/28/2015  CLINICAL DATA:  Wheezing.  Asthma.  Fever. EXAM: CHEST - 2 VIEW COMPARISON:  CT 07/26/2015. FINDINGS: The heart is enlarged. Bilateral pleural effusions have increased. Bibasilar airspace disease has progressed as well, left greater than right. Lung volumes are low. Mild pulmonary vascular congestion has increased. IMPRESSION: 1. Cardiomegaly with increasing pulmonary vascular congestion and bilateral pleural effusions, left greater than right. 2. Bibasilar airspace disease, also worse on the left likely reflects atelectasis. Infection is considered less likely. Electronically Signed   By: San Morelle M.D.   On: 07/28/2015 14:26   Ct Angio Chest Pe W/cm &/or Wo Cm  07/26/2015  CLINICAL DATA:  Chest pain for 3 days. EXAM: CT ANGIOGRAPHY CHEST WITH CONTRAST TECHNIQUE: Multidetector CT imaging of the chest was performed using the  standard protocol during bolus administration of intravenous contrast. Multiplanar CT image reconstructions and MIPs were obtained to evaluate the vascular anatomy. CONTRAST:  115m OMNIPAQUE IOHEXOL 350 MG/ML SOLN COMPARISON:  CT scan of November 27, 2013. FINDINGS: No pneumothorax is noted. Minimal bilateral posterior basilar subsegmental atelectasis is noted. Minimal left pleural effusion may be present filling defect is seen in lower lobe branch of the left pulmonary artery concerning for acute pulmonary embolus. There is no evidence of thoracic aortic dissection or aneurysm. RV/LV ratio is within normal limits at approximately 0.6. No significant osseous abnormality is noted in the chest. Within the visualized portion of the abdomen, fatty infiltration of the liver is noted. Stable anterior mediastinal lymph node is noted. Review of the MIP images confirms the above findings. IMPRESSION: Fatty infiltration of the liver. Minimal bilateral posterior basilar subsegmental atelectasis. Filling defect is seen in lower lobe branch of left pulmonary artery consistent with acute pulmonary embolus. Critical Value/emergent results were called by telephone at the time of interpretation on 07/26/2015 at 4:22 pm to Dr. SFredia Sorrow, who verbally acknowledged these results. Electronically Signed   By: JMarijo Conception M.D.  On: 07/26/2015 16:23   US Venous Img Lower Bilateral  07/29/2015  CLINICAL DATA:  Pulmonary embolism of unknown origin EXAM: BILATERAL LOWER EXTREMITY VENOUS DOPPLER ULTRASOUND TECHNIQUE: Gray-scale sonography with graded compression, as well as color Doppler and duplex ultrasound were performed to evaluate the lower extremity deep venous systems from the level of the common femoral vein and including the common femoral, femoral, profunda femoral, popliteal and calf veins including the posterior tibial, peroneal and gastrocnemius veins when visible. The superficial great saphenous vein was also  interrogated. Spectral Doppler was utilized to evaluate flow at rest and with distal augmentation maneuvers in the common femoral, femoral and popliteal veins. COMPARISON:  None. FINDINGS: RIGHT LOWER EXTREMITY Common Femoral Vein: No evidence of thrombus. Normal compressibility, respiratory phasicity and response to augmentation. Saphenofemoral Junction: No evidence of thrombus. Normal compressibility and flow on color Doppler imaging. Profunda Femoral Vein: No evidence of thrombus. Normal compressibility and flow on color Doppler imaging. Femoral Vein: No evidence of thrombus. Normal compressibility, respiratory phasicity and response to augmentation. Popliteal Vein: No evidence of thrombus. Normal compressibility, respiratory phasicity and response to augmentation. Calf Veins: No evidence of thrombus. Normal compressibility and flow on color Doppler imaging. Superficial Great Saphenous Vein: No evidence of thrombus. Normal compressibility and flow on color Doppler imaging. Venous Reflux:  None. Other Findings:  None. LEFT LOWER EXTREMITY Common Femoral Vein: No evidence of thrombus. Normal compressibility, respiratory phasicity and response to augmentation. Saphenofemoral Junction: No evidence of thrombus. Normal compressibility and flow on color Doppler imaging. Profunda Femoral Vein: No evidence of thrombus. Normal compressibility and flow on color Doppler imaging. Femoral Vein: No evidence of thrombus. Normal compressibility, respiratory phasicity and response to augmentation. Popliteal Vein: No evidence of thrombus. Normal compressibility, respiratory phasicity and response to augmentation. Calf Veins: No evidence of thrombus. Normal compressibility and flow on color Doppler imaging. Superficial Great Saphenous Vein: No evidence of thrombus. Normal compressibility and flow on color Doppler imaging. Venous Reflux:  None. Other Findings:  None. IMPRESSION: No evidence of deep venous thrombosis. Electronically  Signed   By: Inez Catalina M.D.   On: 07/29/2015 14:51   Dg Chest Portable 1 View  07/26/2015  CLINICAL DATA:  Chest pain EXAM: PORTABLE CHEST 1 VIEW COMPARISON:  10/02/2014 chest radiograph FINDINGS: Low lung volumes. Stable cardiomediastinal silhouette with top-normal heart size. No pneumothorax. No pleural effusion. Clear lungs, with no focal lung consolidation and no pulmonary edema. IMPRESSION: Low lung volumes with no active cardiopulmonary disease. Electronically Signed   By: Ilona Sorrel M.D.   On: 07/26/2015 13:36    Microbiology: Recent Results (from the past 240 hour(s))  Culture, blood (routine x 2)     Status: None (Preliminary result)   Collection Time: 07/28/15  5:10 AM  Result Value Ref Range Status   Specimen Description BLOOD RIGHT ARM  Final   Special Requests BOTTLES DRAWN AEROBIC AND ANAEROBIC 12CC  Final   Culture NO GROWTH 3 DAYS  Final   Report Status PENDING  Incomplete  Culture, blood (routine x 2)     Status: None (Preliminary result)   Collection Time: 07/28/15  5:15 AM  Result Value Ref Range Status   Specimen Description BLOOD RIGHT HAND  Final   Special Requests BOTTLES DRAWN AEROBIC AND ANAEROBIC 10CC  Final   Culture NO GROWTH 3 DAYS  Final   Report Status PENDING  Incomplete  Urine culture     Status: None   Collection Time: 07/28/15  2:25 PM  Result  Value Ref Range Status   Specimen Description URINE, RANDOM  Final   Special Requests NONE  Final   Culture   Final    MULTIPLE SPECIES PRESENT, SUGGEST RECOLLECTION Performed at [facility]    Report Status 07/30/2015 FINAL  Final     Labs: Basic Metabolic Panel:  Recent Labs Lab 07/27/15 0450 07/28/15 0509 07/29/15 0521 07/30/15 0523 07/31/15 0538  NA 134* 131* 129* 132* 134*  K 3.9 3.6 3.6 4.1 3.4*  CL 100* 97* 93* 95* 96*  CO2 25 25 29 27 28   GLUCOSE 150* 154* 125* 97 101*  BUN 14 17 15 13 12   CREATININE 1.04* 1.63* 1.36* 1.21* 1.13*  CALCIUM 9.0 8.4* 8.5* 8.9 9.2   Liver  Function Tests:  Recent Labs Lab 07/26/15 1318 07/27/15 0450  AST 50* 46*  ALT 47 44  ALKPHOS 43 31*  BILITOT 1.2 1.1  PROT 8.0 7.0  ALBUMIN 4.6 3.9    CBC:  Recent Labs Lab 07/27/15 0450 07/28/15 0509 07/29/15 0521 07/30/15 0523 07/31/15 0538  WBC 9.3 8.8 7.7 6.8 7.1  HGB 11.5* 10.2* 9.3* 9.7* 10.2*  HCT 34.3* 30.5* 27.8* 28.2* 30.4*  MCV 96.9 95.9 94.9 93.7 94.4  PLT 169 156 148* 178 226      Principal Problem:   PE (pulmonary thromboembolism) (HCC) Active Problems:   CKD (chronic kidney disease), stage II   Essential hypertension, benign   Obesity   Essential hypertension   Hyperglycemia   AKI (acute kidney injury) ([place])   Fever, unspecified   Hyponatremia   Wheezes   Constipation   CAP (community acquired pneumonia)   Acute respiratory failure with hypoxia ([place])   Time coordinating discharge: 45 minutes  Signed:  Murray Hodgkins, MD [facility] 07/31/2015, 12:39 PM   By signing my name below, I, Rosalie Doctor attest that this documentation has been prepared under the direction and in the presence of Murray Hodgkins, MD Electronically signed: Rosalie Doctor, Scribe.  07/31/2015 12:01pm  I personally performed the services described in this documentation. All medical record entries made by the scribe were at my direction. I have reviewed the chart and agree that the record reflects my personal performance and is accurate and complete. Murray Hodgkins, MD

## 2015-07-31 NOTE — Progress Notes (Signed)
PHARMACIST - PHYSICIAN COMMUNICATION DR:   Roderic Palau CONCERNING: Antibiotic IV to Oral Route Change Policy  RECOMMENDATION: This patient is receiving Levaquin by the intravenous route.  Based on criteria approved by the Pharmacy and Therapeutics Committee, the antibiotic(s) is/are being converted to the equivalent oral dose form(s).   DESCRIPTION: These criteria include:  Patient being treated for a respiratory tract infection, urinary tract infection, cellulitis or clostridium difficile associated diarrhea if on metronidazole  The patient is not neutropenic and does not exhibit a GI malabsorption state  The patient is eating (either orally or via tube) and/or has been taking other orally administered medications for a least 24 hours  The patient is improving clinically and has a Tmax < 100.5  If you have questions about this conversion, please contact the Pharmacy Department  [x]   [phone] )  [facility] []   [phone] )  [facility] []   [phone] )  [facility] []   [phone] )  [facility] []   [phone] )  [facility]    [address] [place] Crane, PharmD

## 2015-07-31 NOTE — Care Management Important Message (Signed)
Important Message  Patient Details  Name: [name] MRN: [id number] Date of Birth: [birth year]   Medicare Important Message Given:  Yes-second notification given    Sherald Barge, RN 07/31/2015, 1:37 PM

## 2015-07-31 NOTE — Care Management Note (Signed)
Case Management Note  Patient Details  Name: [name] MRN: [id number] Date of Birth: 08-Apr-1951  Expected Discharge Date:  07/28/15               Expected Discharge Plan:  Home/Self Care  In-House Referral:  NA  Discharge planning Services  CM Consult  Post Acute Care Choice:  NA Choice offered to:  NA  DME Arranged:    DME Agency:     HH Arranged:    [place] Agency:     Status of Service:  Completed, signed off  Medicare Important Message Given:  Yes-second notification given Date Medicare IM Given:    Medicare IM give by:    Date Additional Medicare IM Given:    Additional Medicare Important Message give by:     If discussed at [place] of Stay Meetings, dates discussed:    Additional Comments: Pt discharging home today with self care. Home o2 assessment performed and pt does not meet requirements. No CM needs noted.   Sherald Barge, RN 07/31/2015, 1:38 PM

## 2015-07-31 NOTE — Telephone Encounter (Signed)
Please call and see what the issue is, I do not give any orders for the hospital.  She may be best off discussing her issue with the nurse with the hospitalist that is taking care of her

## 2015-07-31 NOTE — Progress Notes (Addendum)
PROGRESS NOTE  [name] IHK:[id number] DOB: 27-Apr-1951 DOA: 07/26/2015 PCP: [name], MD  Summary: 41 yof with a hx of G6PD deficiency and sickle cell trait presented with pleuritic left sided chest pain and dyspnea. While in the ED, CTA revealed a left sided PE. She was admitted for further management.   Assessment/Plan: 1. Acute left lower lobe PE. Started on IV heparin on admission, but discontinued in favor of Eliquis. US Venous lower bilateral negative for DVT. She will need at least 6 months of treatment. She was seen by GI for history of rectal bleeding and concerns for starting anticoagulation. She has not had any evidence of GI bleeding at this time and GI has signed off. 2. Bronchospasm/wheezes. Follow-up chest x-ray 10/23 revealed cardiomegaly with increasing pulmonary vascular congestion and bilateral pleural effusions; favor atelectasis.  3. Acute confusion this AM. Resolved. Possibly related to Levaquin. Alert and oriented. No hallucinations or SI. Patient wants to go home.  4. Hyponatremia, essentially resolved following Lasix and fluid restriction.  5. Hypokalemia, repleted. 6. Fever, resolved. Max temp of 102 on 10/23.no evidence of pneumonia. Favor atelectasis. UC is negative. Blood cultures have shown no growth to date.  7. History of colon polyps, peptic ulcer disease, and hemorrhoids. No evidence of rectal bleeding Will continue PPI and Anusol HC Suppository. GI recommendations appreciated. 8. Normocytic anemia. Hgb 10.2. No evidence of bleeding. Possibly due to dilution by IV hydration. Will continue to monitor. Currently is stable 9. Essential hypertension, stable. Continue home medications.  10. 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    Overall improved. Discharge home today. No evidence of pneumonia. Suspect mild confusion from Levaquin (which has been discontinued). Clear thought process.  Continue PPI, Apixaban 10 mg po twice daily for  total of 7 days, then Apixaban 5 mg po twice daily.  Recommend f/u with Dr. Buelah Manis in one week.   Code Status: Full DVT prophylaxis: Eliquis  Family Communication: Discussed with patient who understands and has no concerns at this time. Disposition Plan: Discharge home today.   Murray Hodgkins, MD  [facility]  Pager [phone] If 7PM-7AM, please contact night-coverage at [email or web address], password [facility] 07/31/2015, 8:23 AM  LOS: 5 days   Consultants:  Gastroenterology  Procedures:  Echo: Study Conclusions  - Left ventricle: The cavity size was normal. Wall thickness was normal. Systolic function was normal. The estimated ejection fraction was in the range of 60% to 65%. Wall motion was normal; there were no regional wall motion abnormalities. - Aortic valve: Mildly calcified annulus. Trileaflet. - Mitral valve: Mildly calcified annulus. - Systemic veins: IVC mildly dilated with normal respiratory variation. Estimated CVP 8 mmHg.  Antibiotics:  Levaquin 10/23>>10/26  HPI/Subjective: Per RN was confused this AM but settled down quickly.   Patient feels nervous but breathing well, no other complaints. Minimal pain. She wants to go home.  She thought the nurses were "talking about her" this morning--about birth certificate and personal information. Patient called Dr. Dorian Heckle office.  Objective: Filed Vitals:   07/30/15 2114 07/31/15 0009 07/31/15 0520 07/31/15 0522  BP: 122/80   129/43  Pulse:    88  Temp: 99.5 F (37.5 C)   98.9 F (37.2 C)  TempSrc: Oral   Oral  Resp: 20   20  Height:      Weight:      SpO2:  98% 98% 97%    Intake/Output Summary (Last 24 hours) at 07/31/15 0823 Last data filed at 07/30/15 1800  Gross per 24 hour  Intake    750 ml  Output      0 ml  Net    750 ml     Filed Weights   07/26/15 1308 07/26/15 1822  Weight: 90.719 kg (200 lb) 94.439 kg (208 lb 3.2 oz)    Exam:    VSS, afebrile, not  hypoxic General: Appears comfortable. Sitting in chair. Ambulating without difficulty.  Cardiovascular: Regular rate and rhythm, no murmur, rub or gallop. No lower extremity edema. Telemetry: ST, sinus tachycardia Respiratory: Clear to auscultation bilaterally, no wheezes, rales or rhonchi. Normal respiratory effort. Abdomen: soft, ntnd Skin: no rash or induration  Musculoskeletal: grossly normal tone bilateral upper and lower extremities Psychiatric: grossly normal mood and affect, speech fluent and appropriate. Oriented to person, place, month, year, president, and reason for hospitalization. Neurologic: grossly non-focal  New data reviewed:  Creatinine improved at 1.13, Potassium 3.4. BMP otherwise unremarkable.  Hgb stable 10.2. CBC unremarkable.   Pertinent data since admission:  CTA of the chest IMPRESSION: Fatty infiltration of the liver. Minimal bilateral posterior basilar subsegmental atelectasis. Filling defect is seen in lower lobe branch of left pulmonary artery consistent with acute pulmonary embolus.  CXR  IMPRESSION: 1. Cardiomegaly with increasing pulmonary vascular congestion and bilateral pleural effusions, left greater than right. 2. Bibasilar airspace disease, also worse on the left likely reflects atelectasis. Infection is considered less likely.  Pending data:  BC  Scheduled Meds: . apixaban  10 mg Oral BID  . [START ON 08/03/2015] apixaban  5 mg Oral BID  . benzonatate  100 mg Oral TID  . brimonidine  1 drop Both Eyes BID  . cyclobenzaprine  10 mg Oral Daily  . escitalopram  10 mg Oral Daily  . [START ON 08/01/2015] estradiol  1 Applicatorful Vaginal Weekly  . fenofibrate  160 mg Oral Daily  .  HYDROmorphone (DILAUDID) injection  1 mg Intravenous 4 times per day  . latanoprost  1 drop Both Eyes QHS  . levofloxacin (LEVAQUIN) IV  750 mg Intravenous Q24H  . magnesium oxide  800 mg Oral Daily  . ondansetron (ZOFRAN) IV  4 mg Intravenous TID WC & HS   . pantoprazole  80 mg Oral Daily  . polyethylene glycol  17 g Oral BID  . senna-docusate  1 tablet Oral BID  . sodium chloride  3 mL Intravenous Q12H  . zolpidem  5 mg Oral QHS   Continuous Infusions: . sodium chloride Stopped (07/29/15 1415)    Principal Problem:   PE (pulmonary thromboembolism) (HCC) Active Problems:   CKD (chronic kidney disease), stage II   Essential hypertension, benign   Obesity   Essential hypertension   Hyperglycemia   AKI (acute kidney injury) ([place])   Fever, unspecified   Hyponatremia   Wheezes   Constipation   CAP (community acquired pneumonia)   Acute respiratory failure with hypoxia ([place])   By signing my name below, I, Rosalie Doctor attest that this documentation has been prepared under the direction and in the presence of Murray Hodgkins, MD Electronically signed: Rosalie Doctor, Scribe. 07/31/2015  12:01pm  I personally performed the services described in this documentation. All medical record entries made by the scribe were at my direction. I have reviewed the chart and agree that the record reflects my personal performance and is accurate and complete. Murray Hodgkins, MD

## 2015-07-31 NOTE — Telephone Encounter (Signed)
Call placed to patient. States that she is being discharged today and issue will have been resolved.
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
## 2015-07-31 NOTE — Telephone Encounter (Signed)
Patient would like for you to call her regarding being discharged from the hospital  Please call her at [phone]

## 2015-07-31 NOTE — Telephone Encounter (Signed)
To MD

## 2015-07-31 NOTE — Progress Notes (Signed)
Patient's room air saturation was 95 percent prior to ambulating, while ambulating in hallway patient's oxygen level remained at 95 percent. No c/o pain or discomfort noted.

## 2015-08-01 NOTE — Telephone Encounter (Signed)
Call placed to patient.  States that she requires F/U with cardio.   Call placed to Dr. Debara Pickett at [facility]. First available appointment is 08/20/2015. Patient states that she will be in [place] at that time. Advised to contact [place] to schedule appointment that works with her schedule.

## 2015-08-02 LAB — CULTURE, BLOOD (ROUTINE X 2)
CULTURE: NO GROWTH
Culture: NO GROWTH

## 2015-08-06 ENCOUNTER — Ambulatory Visit: Payer: Self-pay | Admitting: Family Medicine

## 2015-08-07 ENCOUNTER — Ambulatory Visit (INDEPENDENT_AMBULATORY_CARE_PROVIDER_SITE_OTHER): Admitting: Family Medicine

## 2015-08-07 ENCOUNTER — Encounter: Payer: Self-pay | Admitting: Internal Medicine

## 2015-08-07 ENCOUNTER — Ambulatory Visit (INDEPENDENT_AMBULATORY_CARE_PROVIDER_SITE_OTHER): Admitting: Internal Medicine

## 2015-08-07 ENCOUNTER — Encounter: Payer: Self-pay | Admitting: Family Medicine

## 2015-08-07 VITALS — BP 130/82 | HR 72 | Temp 98.3°F | Resp 16 | Ht 67.0 in | Wt 196.0 lb

## 2015-08-07 VITALS — BP 138/84 | HR 97 | Ht 67.0 in | Wt 193.8 lb

## 2015-08-07 DIAGNOSIS — J4520 Mild intermittent asthma, uncomplicated: Secondary | ICD-10-CM

## 2015-08-07 DIAGNOSIS — I2699 Other pulmonary embolism without acute cor pulmonale: Secondary | ICD-10-CM | POA: Diagnosis not present

## 2015-08-07 DIAGNOSIS — E876 Hypokalemia: Secondary | ICD-10-CM | POA: Diagnosis not present

## 2015-08-07 DIAGNOSIS — N182 Chronic kidney disease, stage 2 (mild): Secondary | ICD-10-CM

## 2015-08-07 DIAGNOSIS — I10 Essential (primary) hypertension: Secondary | ICD-10-CM

## 2015-08-07 DIAGNOSIS — J189 Pneumonia, unspecified organism: Secondary | ICD-10-CM

## 2015-08-07 LAB — COMPLETE METABOLIC PANEL WITH GFR
ALT: 33 U/L — AB (ref 6–29)
AST: 41 U/L — AB (ref 10–35)
Albumin: 4.2 g/dL (ref 3.6–5.1)
Alkaline Phosphatase: 38 U/L (ref 33–130)
BUN: 17 mg/dL (ref 7–25)
CALCIUM: 9.5 mg/dL (ref 8.6–10.4)
CHLORIDE: 106 mmol/L (ref 98–110)
CO2: 25 mmol/L (ref 20–31)
CREATININE: 1.17 mg/dL — AB (ref 0.50–0.99)
GFR, Est African American: 58 mL/min — ABNORMAL LOW (ref >=60)
GFR, Est Non African American: 50 mL/min — ABNORMAL LOW (ref >=60)
GLUCOSE: 103 mg/dL — AB (ref 70–99)
POTASSIUM: 4.1 mmol/L (ref 3.5–5.3)
SODIUM: 138 mmol/L (ref 135–146)
Total Bilirubin: 0.8 mg/dL (ref 0.2–1.2)
Total Protein: 7 g/dL (ref 6.1–8.1)

## 2015-08-07 MED ORDER — HYDROCOD POLST-CPM POLST ER 10-8 MG/5ML PO SUER: 5.0000 mL | Freq: Two times a day (BID) | ORAL | Status: DC | PRN

## 2015-08-07 MED ORDER — ALBUTEROL SULFATE (2.5 MG/3ML) 0.083% IN NEBU: 2.5000 mg | INHALATION_SOLUTION | RESPIRATORY_TRACT | Status: DC | PRN
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
## 2015-08-07 NOTE — Progress Notes (Signed)
Patient ID: [name], female   DOB: 09/17/1951, 64 y.o.   MRN: [id number]   Subjective:    Patient ID: [name], female    DOB: [birth year], 64 y.o.   MRN: [id number]  Patient presents for Hospital F/U  issue here for hospital follow-up. At her last visit I sent her to the emergency room from our office secondary to left-sided chest pain. She was found to have left lower lobe pulmonary embolism. She is now on Eliquis she saw cardiology this morning who adjusted the dose to 10 mg twice a day or the next week then she will go to 10 mg daily. She still gets some chest discomfort when she takes a deep breath but is not having any hemoptysis. She is also quite upset about the way she was treated in the hospital she states that she was given multiple injections of pain medication along with oral pain medication Valium and other "downers" she states the nursing staff was gossiping about her as well. She did bring this to the attention of the physician that was at the hospital. She is now back on her regular home medications. She is also taking both blood pressure medicines as recommended by cardiology this morning. The note was not quite complete and she is just left her office. We both recommended that she not fly at this time because of the pulmonary embolism she had an upcoming trip to [place]. She also requests a nebulizer to use. She does have an albuterol inhaler does help with the  Levaquin In her chest. She was also given nebulized treatments for community-acquired pneumonia. She was placed on Levaquin they thought she may have had an allergic reaction to this that she was very confused she is adamant that she was confused all the multiple medications per above.  Reviewed Hospital course- needs repeat K and NA level     Review Of Systems:  GEN- denies fatigue, fever, weight loss,weakness, recent illness HEENT- denies eye drainage, change in vision, nasal discharge, CVS-+ chest pain,  palpitations RESP- denies SOB,+ cough, wheeze ABD- denies N/V, change in stools, abd pain GU- denies dysuria, hematuria, dribbling, incontinence MSK- denies joint pain, muscle aches, injury Neuro- denies headache, dizziness, syncope, seizure activity       Objective:    BP 130/82 mmHg  Pulse 72  Temp(Src) 98.3 F (36.8 C) (Oral)  Resp 16  Ht 5' 7"  (1.702 m)  Wt 196 lb (88.905 kg)  BMI 30.69 kg/m2 GEN- NAD, alert and oriented x3 HEENT- PERRL, EOMI, non injected sclera, pink conjunctiva, MMM, oropharynx clear CVS- RRR, no murmur RESP-CTAB ABD-NABS,soft,NT,ND  EXT- No edema Pulses- Radial  2       Assessment & Plan:      Problem List Items Addressed This Visit    None    Visit Diagnoses    Hypokalemia    -  Primary    Relevant Orders    COMPLETE METABOLIC PANEL WITH GFR       Note: This dictation was prepared with Dragon dictation along with smaller phrase technology. Any transcriptional errors that result from this process are unintentional.

## 2015-08-07 NOTE — Assessment & Plan Note (Signed)
She will need 6 months of treatment. There is some cholesterol family history of blood clots however her sister seemed to then after she had been on estrogen therapy. [name] has traveled in the past years so this may have contributed to the blood clot. For now we will not pursue any anticoagulation workup

## 2015-08-07 NOTE — Patient Instructions (Addendum)
Eliquis - take 76m (2 tablets) twice daily for 7 total days   >> day 1 = Nov. 2 - today  >> day 2 = Nov. 3  >> day 3 = Nov. 4  >> day 4 = Nov. 5  >> day 5 = Nov. 6  >> day 6 = Nov. 7  >> day 7 = Nov. 8  Please take hydrochlorothiazide & irbesartan   Your physician recommends that you schedule a follow-up appointment in: 6 months

## 2015-08-07 NOTE — Patient Instructions (Addendum)
[facility] for Nebulizer Cough medicine refilled Note given for airlines We will call with lab results F/U December 2nd

## 2015-08-07 NOTE — Assessment & Plan Note (Addendum)
Concern for community acquired pneumonia. She is status post treatment. Her work of breathing sounds good today. I did refill her cough medicine. She has some scarring in the lung. She occasionally gets these wheezing and tightness episodes. She did respond well to the albuterol nebs therefore we will prescribe her nebulizer machine.   With regards to the delirium she has the hospital this could've been due to the multiple medications she was also in the ill state. She is taking Levaquin multiple times before and has not had any problems with it.

## 2015-08-09 ENCOUNTER — Encounter: Payer: Self-pay | Admitting: *Deleted

## 2015-08-13 ENCOUNTER — Inpatient Hospital Stay: Admitting: Family Medicine

## 2015-08-20 ENCOUNTER — Ambulatory Visit: Admitting: Internal Medicine

## 2015-08-27 ENCOUNTER — Telehealth: Payer: Self-pay | Admitting: Family Medicine

## 2015-08-27 NOTE — Telephone Encounter (Signed)
MD please advise

## 2015-08-27 NOTE — Telephone Encounter (Signed)
Patient is calling to say that the metronidazole cream that was prescribed, is not working  Would like to know if something else can be called in  Her face is very broke out  [phone]

## 2015-08-28 NOTE — Telephone Encounter (Signed)
No change to eliquis Stop the metrogel, use mild cleanser Refer to dermatolgy

## 2015-08-28 NOTE — Telephone Encounter (Signed)
Her face was clear at our last visit and this was after the same medication but was given in an oral form when she was hospitalized.  I recommend he increase to BID with the gel, other option is referral to dermatologist to make sure we have the right diagnosis

## 2015-08-28 NOTE — Telephone Encounter (Signed)
Call placed to patient.   Reports that she has red patches of itchy dry skin on face, abdomen and back. States that areas are much like what MD noted at last visit.   States that prescription MD sent in is more like a gel mask that peels off when dry. Reports that she does not think it is helping the rash, in fact she believes it is making it worse.   Also states that she is concerned about onset of rash with Eliquis side effects:  Uncommon (0.1% to 1%): Hypersensitivity (including drug hypersensitivity such as skin rash and anaphylactic reaction such as allergic edema).  MD please advise.

## 2015-08-28 NOTE — Telephone Encounter (Signed)
Call placed to patient and patient made aware.   States that she is leaving to go to [place] on Friday and she will see dermatologist there.

## 2015-09-04 ENCOUNTER — Ambulatory Visit (INDEPENDENT_AMBULATORY_CARE_PROVIDER_SITE_OTHER): Admitting: Family Medicine

## 2015-09-04 ENCOUNTER — Encounter: Payer: Self-pay | Admitting: Family Medicine

## 2015-09-04 VITALS — BP 126/64 | HR 78 | Temp 98.3°F | Resp 14 | Ht 67.0 in | Wt 206.0 lb

## 2015-09-04 DIAGNOSIS — I2699 Other pulmonary embolism without acute cor pulmonale: Secondary | ICD-10-CM | POA: Diagnosis not present

## 2015-09-04 DIAGNOSIS — L719 Rosacea, unspecified: Secondary | ICD-10-CM | POA: Diagnosis not present

## 2015-09-04 MED ORDER — ONDANSETRON HCL 4 MG PO TABS: 4.0000 mg | ORAL_TABLET | Freq: Three times a day (TID) | ORAL | Status: DC | PRN

## 2015-09-04 NOTE — Progress Notes (Signed)
Patient ID: [name], female   DOB: October 26, 1950, 64 y.o.   MRN: [id number]   Subjective:    Patient ID: [name], female    DOB: 08-04-1951, 64 y.o.   MRN: [id number]  Patient presents for F/U  patient here for follow-up before she leaves for [place]. She was diagnosed with pulmonary embolism back in October. She is now adjusted to her blood thinner without any difficulties. She still continues to have a problem with a rash on her face which comes and goes. She states that she gets large red blisters and then they go down. I gave her MetroGel with the assumption of rosacea but states that this is not helping. She did use a little bit of Neosporin which helped. She wants to wait until she is to [place] to go see a dermatologist.  Medications were reviewed. She needs a letter stating that she is safe to travel and that she may bring all her medications with her.  She was worried her ankles were swelling, she noticed last night, no SOB  Review Of Systems:  GEN- denies fatigue, fever, weight loss,weakness, recent illness HEENT- denies eye drainage, change in vision, nasal discharge, CVS- denies chest pain, palpitations RESP- denies SOB, cough, wheeze ABD- denies N/V, change in stools, abd pain GU- denies dysuria, hematuria, dribbling, incontinence MSK- denies joint pain, muscle aches, injury Neuro- denies headache, dizziness, syncope, seizure activity       Objective:    BP 126/64 mmHg  Pulse 78  Temp(Src) 98.3 F (36.8 C) (Oral)  Resp 14  Ht 5' 7"  (1.702 m)  Wt 206 lb (93.441 kg)  BMI 32.26 kg/m2 GEN- NAD, alert and oriented x3 HEENT- PERRL, EOMI, non injected sclera, pink conjunctiva, MMM, oropharynx clear Neck- Supple, no thyromegaly, no JVD CVS- RRR, no murmur RESP-CTAB ABD-NABS,soft,NT,ND Skin- 2 small papules on left cheek, no erythema,no blisters EXT- No edema Pulses- Radial, DP- 2+        Assessment & Plan:      Problem List Items Addressed  This Visit    None      Note: This dictation was prepared with Dragon dictation along with smaller phrase technology. Any transcriptional errors that result from this process are unintentional.

## 2015-09-04 NOTE — Assessment & Plan Note (Signed)
Now 6 weeks out from her pulmonary embolism. She has discussed with her pulmonologist. She isn't clear to fly. Advised her that she used to get up and walk around on the plane every 30-45 minutes. She will continue her blood thinners. We reviewed all her medications in detail. I provided her with documentation for her airline carrier. Of note I do not see any significant swelling in her ankles today. She is on hydrochlorothiazide.

## 2015-09-04 NOTE — Assessment & Plan Note (Signed)
I still believe this is rosacea that she has on her face. She is going to get in contact with a dermatologist while she is in [place]. For now she can stop the MetroGel and she thinks that it makes it worse.

## 2015-09-04 NOTE — Patient Instructions (Addendum)
Use sunscreen Call if you cant get a dermatology appointment Continue current medications Change f/u to Feb

## 2015-09-06 ENCOUNTER — Ambulatory Visit: Admitting: Family Medicine

## 2015-10-21 ENCOUNTER — Other Ambulatory Visit: Payer: Self-pay | Admitting: Family Medicine

## 2015-10-21 DIAGNOSIS — I10 Essential (primary) hypertension: Secondary | ICD-10-CM

## 2015-10-21 MED ORDER — ZOLPIDEM TARTRATE 10 MG PO TABS: ORAL_TABLET | ORAL | Status: DC

## 2015-10-21 MED ORDER — BRIMONIDINE TARTRATE 0.2 % OP SOLN
1.0000 [drp] | Freq: Two times a day (BID) | OPHTHALMIC | Status: DC
Start: 2015-10-21 — End: 2016-02-25

## 2015-10-21 MED ORDER — IRBESARTAN 150 MG PO TABS: 75.0000 mg | ORAL_TABLET | Freq: Every day | ORAL | Status: DC

## 2015-10-21 MED ORDER — FENOFIBRATE 145 MG PO TABS: 145.0000 mg | ORAL_TABLET | Freq: Every day | ORAL | Status: DC

## 2015-10-21 MED ORDER — LATANOPROST 0.005 % OP SOLN: 1.0000 [drp] | Freq: Every day | OPHTHALMIC | Status: DC

## 2015-10-21 MED ORDER — HYDROCHLOROTHIAZIDE 12.5 MG PO TABS: 12.5000 mg | ORAL_TABLET | Freq: Every day | ORAL | Status: DC

## 2015-10-21 MED ORDER — ALPRAZOLAM 1 MG PO TABS: 1.0000 mg | ORAL_TABLET | Freq: Two times a day (BID) | ORAL | Status: DC | PRN

## 2015-10-21 MED ORDER — DEXLANSOPRAZOLE 60 MG PO CPDR: 60.0000 mg | DELAYED_RELEASE_CAPSULE | Freq: Every day | ORAL | Status: DC

## 2015-10-21 MED ORDER — APIXABAN 5 MG PO TABS: 5.0000 mg | ORAL_TABLET | Freq: Two times a day (BID) | ORAL | Status: DC

## 2015-10-21 NOTE — Telephone Encounter (Signed)
[facility]eds by Mail [facility]) requires faxed prescription since e-scripts are not accepted.   Ok to refill??  Last office visit 09/04/2015  Last refill Xanax 04/22/2015, #1 refill.   Last refill Ambien 04/22/2015, #2 refills.

## 2015-10-21 NOTE — Telephone Encounter (Signed)
Pt called to request a refill of Irbesartan 150 mg, Hydrochlorothiazide 12.5, Zolpidem partarte 10 mg, Alprazolam 1 mg, Eliquis 5 mg, Tricor 145 mg, Dexilant 60 mg., Brimonidine 1 ml bottle and Latanoprost eye drops be called in to [facility] ([facility] She states that she is feeling well. She is in [place] and wants them called in early because it takes the mail order 21 days to get the meds to her.  Please call [phone] if you have any questions.

## 2015-10-21 NOTE — Telephone Encounter (Signed)
Prescription faxed

## 2015-10-21 NOTE — Telephone Encounter (Signed)
ok 

## 2015-10-28 ENCOUNTER — Ambulatory Visit: Admitting: Family Medicine

## 2015-10-31 ENCOUNTER — Telehealth: Payer: Self-pay | Admitting: Family Medicine

## 2015-10-31 NOTE — Telephone Encounter (Signed)
Call placed to patient.   Patient states that she was walking the day prior and noted pain to the top of R foot.   Reports that she has bee elevating her foot and using compression, but has not relief.   Denies any edema to extremity, discoloration of extremity, or pain in extremity.   Advised to go to MD in [place].   Requested patient to notify [facility] of what is found.

## 2015-10-31 NOTE — Telephone Encounter (Signed)
Pt called stating that her foot hurts and she is concerned that she has a blood clot. She wants to know if she should go see a doctor before flying home from [place]. [phone]

## 2015-10-31 NOTE — Telephone Encounter (Signed)
Agree with above 

## 2015-11-15 ENCOUNTER — Telehealth: Payer: Self-pay | Admitting: Gastroenterology

## 2015-11-15 NOTE — Telephone Encounter (Signed)
FYI to Dr. Oneida Alar.

## 2015-11-15 NOTE — Telephone Encounter (Signed)
REVIEWED-NO ADDITIONAL RECOMMENDATIONS. 

## 2015-11-15 NOTE — Telephone Encounter (Signed)
Pt is asking about samples for bloating doesn't know the name. It's in a purple box with gold lettering. Please call her at [phone]

## 2015-11-15 NOTE — Telephone Encounter (Signed)
Per Dr. Oneida Alar OV note of 07/18/2015 pt was given FDgard samples. I am leaving # 16 at front for pt to pick up on Monday.

## 2015-11-18 ENCOUNTER — Encounter: Payer: Self-pay | Admitting: Gastroenterology

## 2015-11-22 ENCOUNTER — Ambulatory Visit: Admitting: Family Medicine

## 2015-11-29 ENCOUNTER — Ambulatory Visit: Admitting: Family Medicine

## 2015-12-04 ENCOUNTER — Other Ambulatory Visit: Payer: Self-pay | Admitting: Family Medicine

## 2015-12-04 DIAGNOSIS — Z1231 Encounter for screening mammogram for malignant neoplasm of breast: Secondary | ICD-10-CM

## 2015-12-06 ENCOUNTER — Ambulatory Visit (INDEPENDENT_AMBULATORY_CARE_PROVIDER_SITE_OTHER): Admitting: Family Medicine

## 2015-12-06 ENCOUNTER — Encounter: Payer: Self-pay | Admitting: Family Medicine

## 2015-12-06 VITALS — BP 128/78 | HR 64 | Temp 98.8°F | Resp 18 | Ht 67.0 in | Wt 208.0 lb

## 2015-12-06 DIAGNOSIS — L719 Rosacea, unspecified: Secondary | ICD-10-CM | POA: Diagnosis not present

## 2015-12-06 DIAGNOSIS — I10 Essential (primary) hypertension: Secondary | ICD-10-CM | POA: Diagnosis not present

## 2015-12-06 DIAGNOSIS — N952 Postmenopausal atrophic vaginitis: Secondary | ICD-10-CM

## 2015-12-06 DIAGNOSIS — R300 Dysuria: Secondary | ICD-10-CM | POA: Diagnosis not present

## 2015-12-06 DIAGNOSIS — J4520 Mild intermittent asthma, uncomplicated: Secondary | ICD-10-CM

## 2015-12-06 DIAGNOSIS — I2699 Other pulmonary embolism without acute cor pulmonale: Secondary | ICD-10-CM | POA: Diagnosis not present

## 2015-12-06 LAB — URINALYSIS, ROUTINE W REFLEX MICROSCOPIC
BILIRUBIN URINE: NEGATIVE
Glucose, UA: NEGATIVE
KETONES UR: NEGATIVE
NITRITE: NEGATIVE
Protein, ur: NEGATIVE
Specific Gravity, Urine: 1.015 (ref 1.001–1.035)
pH: 6 (ref 5.0–8.0)

## 2015-12-06 LAB — URINALYSIS, MICROSCOPIC ONLY
Casts: NONE SEEN [LPF]
Crystals: NONE SEEN [HPF]
Yeast: NONE SEEN [HPF]

## 2015-12-06 MED ORDER — METRONIDAZOLE 0.75 % EX GEL: 1.0000 "application " | Freq: Every day | CUTANEOUS | Status: DC

## 2015-12-06 MED ORDER — ESTRADIOL 0.1 MG/GM VA CREA: 1.0000 | TOPICAL_CREAM | VAGINAL | Status: DC

## 2015-12-06 MED ORDER — CARBOXYMETHYLCELLULOSE SODIUM 0.5 % OP SOLN: 1.0000 [drp] | Freq: Three times a day (TID) | OPHTHALMIC | Status: DC | PRN

## 2015-12-06 MED ORDER — CIPROFLOXACIN HCL 500 MG PO TABS: 500.0000 mg | ORAL_TABLET | Freq: Two times a day (BID) | ORAL | Status: DC

## 2015-12-06 NOTE — Patient Instructions (Addendum)
Referral to dermatology referral  Eliquis goes through the end of April  Zyrtec once a day for itching - Do not take with claritin   Take antibiotics as prescribed  F/U 4 months- Needs 30 MINUTE SLOT

## 2015-12-06 NOTE — Assessment & Plan Note (Addendum)
She will complete the Eliquis at the end of April this will be 6 months of treatment  She now brings up itching with Eliquis and states that is been going on since she started the medication though I've seen her multiple times and she never mentioned this complaint. I discussed the weekend. Developed within tried different blood thinner she declines this and wants to continue no signs of any anaphylaxis no rash noted otherwise on her skin.

## 2015-12-06 NOTE — Progress Notes (Signed)
Patient ID: [name], female   DOB: Apr 23, 1951, 65 y.o.   MRN: [id number]   Subjective:    Patient ID: MEGGIE [name], female    DOB: 1950-12-18, 65 y.o.   MRN: [id number]  Patient presents for 3 month F/U and Dysuria  Pt here for F/U she recently returned from [place].  She has multuiple medical problems and somatic complaints. She is still on Eliquis for pulmonary embolism which she is suppose to take for 6 months-( End of April). She states is from the beginning when she was on the medication she will get itching and occasionally fine rash on her hands but it was mostly itching she continue to take it and will take Benadryl as needed but this makes her very sleepy. At last for a few minutes when she first takes her dose of medication   Persistant rash on face- diagnosed with Rosacea advised to see dermatologist- the rash cleared with oral Flagyl, but she states no improvement with topical. SHe now thinks it came from facial waxing but it has been present > 3 months so I doubt this is the cause   Dysuria for past 4 days, no fever, low back pain, has taken AZO    Has F/U with GI for her chronic abdominal issues, fatty liver IBS GERD   History of asthma- gets allergy symptoms during weather changes, has claritin, albuterol     Review Of Systems:  GEN- denies fatigue, fever, weight loss,weakness, recent illness HEENT- denies eye drainage, change in vision, nasal discharge, CVS- denies chest pain, palpitations RESP- denies SOB, cough, wheeze ABD- denies N/V, change in stools, abd pain GU- denies dysuria, hematuria, dribbling, incontinence MSK- denies joint pain, muscle aches, injury Neuro- denies headache, dizziness, syncope, seizure activity       Objective:    BP 128/78 mmHg  Pulse 64  Temp(Src) 98.8 F (37.1 C) (Oral)  Resp 18  Ht 5' 7"  (1.702 m)  Wt 208 lb (94.348 kg)  BMI 32.57 kg/m2 GEN- NAD, alert and oriented x3 HEENT- PERRL, EOMI, non injected sclera, pink  conjunctiva, MMM, oropharynx clear Neck- Supple, no thyromegaly CVS- RRR, no murmur RESP-CTAB ABD-NABS,soft,NT,ND Skin- mild erythema bilat cheeks,nose, no  blisters EXT- No edema Pulses- Radial, DP- 2+        Assessment & Plan:      Problem List Items Addressed This Visit    Vaginal atrophy    Continue Estrace. Absent in her urine off for culture we'll go ahead and start her on ciprofloxacin      Rosacea    The  order of events regarding the rash she continues on her face is very confusing. I medicine her to dermatology. She is still using the MetroGel which does help some      Relevant Orders   Ambulatory referral to Dermatology   PE (pulmonary thromboembolism) ([place])    She will complete the Eliquis at the end of April this will be 6 months of treatment  She now brings up itching with Eliquis and states that is been going on since she started the medication though I've seen her multiple times and she never mentioned this complaint. I discussed the weekend. Developed within tried different blood thinner she declines this and wants to continue no signs of any anaphylaxis no rash noted otherwise on her skin.      Essential hypertension, benign    Blood pressure well controlled no change in medication      Asthma  Currently stable on current medications. As she has sequential itching with the blood in her abdomen have her change to Zyrtec she will continue her other allergy medicines       Other Visit Diagnoses    Dysuria    -  Primary    Relevant Orders    Urinalysis, Routine w reflex microscopic (not at [facility]) (Completed)    Urine culture       Note: This dictation was prepared with Dragon dictation along with smaller phrase technology. Any transcriptional errors that result from this process are unintentional.

## 2015-12-06 NOTE — Assessment & Plan Note (Signed)
Currently stable on current medications. As she has sequential itching with the blood in her abdomen have her change to Zyrtec she will continue her other allergy medicines

## 2015-12-06 NOTE — Assessment & Plan Note (Signed)
The  order of events regarding the rash she continues on her face is very confusing. I medicine her to dermatology. She is still using the MetroGel which does help some

## 2015-12-06 NOTE — Assessment & Plan Note (Signed)
Blood pressure well controlled no change in medication

## 2015-12-06 NOTE — Assessment & Plan Note (Signed)
Continue Estrace. Absent in her urine off for culture we'll go ahead and start her on ciprofloxacin

## 2015-12-08 LAB — URINE CULTURE

## 2015-12-11 ENCOUNTER — Telehealth: Payer: Self-pay | Admitting: Family Medicine

## 2015-12-11 NOTE — Telephone Encounter (Signed)
Okay to send 2 more days of Cipro She can also use AZO which is OTC

## 2015-12-11 NOTE — Telephone Encounter (Signed)
Patient is calling to say she was here for uti recently, now is still feeling a little "itchy" and would like a call back regarding this  [phone]

## 2015-12-11 NOTE — Telephone Encounter (Signed)
Pt states can still feel a little itchy, almost gone but itching, when urinates can still feel burn like at the urethra and is a little uncomfortable. No discharge, drinking lots of fluid.  Wants to know if can call in something to help with the itching/burn, is requesting another round antibiotic.  Took last antibiotic this am.   [employer]

## 2015-12-12 ENCOUNTER — Other Ambulatory Visit: Payer: Self-pay | Admitting: *Deleted

## 2015-12-12 MED ORDER — CIPROFLOXACIN HCL 500 MG PO TABS: 500.0000 mg | ORAL_TABLET | Freq: Two times a day (BID) | ORAL | Status: DC

## 2015-12-12 MED ORDER — CIPROFLOXACIN HCL 500 MG PO TABS
500.0000 mg | ORAL_TABLET | Freq: Two times a day (BID) | ORAL | Status: DC
Start: 2015-12-12 — End: 2015-12-17

## 2015-12-12 NOTE — Telephone Encounter (Signed)
Pt aware of message, script sent to pharmacy

## 2015-12-17 ENCOUNTER — Ambulatory Visit (INDEPENDENT_AMBULATORY_CARE_PROVIDER_SITE_OTHER): Admitting: Family Medicine

## 2015-12-17 ENCOUNTER — Encounter: Payer: Self-pay | Admitting: Family Medicine

## 2015-12-17 VITALS — BP 124/60 | HR 72 | Temp 98.5°F | Resp 14 | Ht 67.0 in | Wt 207.0 lb

## 2015-12-17 DIAGNOSIS — M25475 Effusion, left foot: Secondary | ICD-10-CM

## 2015-12-17 MED ORDER — OXYCODONE-ACETAMINOPHEN 5-325 MG PO TABS: 1.0000 | ORAL_TABLET | Freq: Three times a day (TID) | ORAL | Status: DC | PRN

## 2015-12-17 NOTE — Progress Notes (Signed)
Patient ID: [name], female   DOB: 03/05/1951, 65 y.o.   MRN: [id number]   Subjective:    Patient ID: [name], female    DOB: Sep 17, 1951, 65 y.o.   MRN: [id number]  Patient presents for L Foot Pain Chair with left foot pain for the past 3 days. She will obtain a pedicure on Friday the next morning she had significant pain when she walks on her mid foot and on the sole. She then began having swelling and noticed some redness. She has not noticed any scratches or opening on the feet. She was concerned about gout as it runs in her family. She's not had any other medication changes but she was on Cipro recently. She did try icing the foot/heat  and elevating which has helped minimally.    Review Of Systems:  GEN- denies fatigue, fever, weight loss,weakness, recent illness HEENT- denies eye drainage, change in vision, nasal discharge, CVS- denies chest pain, palpitations RESP- denies SOB, cough, wheeze ABD- denies N/V, change in stools, abd pain GU- denies dysuria, hematuria, dribbling, incontinence MSK-+ joint pain, muscle aches, injury Neuro- denies headache, dizziness, syncope, seizure activity       Objective:    BP 124/60 mmHg  Pulse 72  Temp(Src) 98.5 F (36.9 C) (Oral)  Resp 14  Ht 5' 7"  (1.702 m)  Wt 207 lb (93.895 kg)  BMI 32.41 kg/m2 GEN- NAD, alert and oriented x3 MSK- Right foot-- bunions FROM, no swelling, left foot- swelling mild erythema between 1st and 3rd digits, pain with ROM of ankle, toes, antalgic gait, well healed old surgical scar between 1st and 2nd digit Skin- skin intact Pulse- DP 2+        Assessment & Plan:      Problem List Items Addressed This Visit    None    Visit Diagnoses    Swelling of foot joint, left    -  Primary    Unclear cauase, possible inflammation from previous surgery site with the massage, has had elevated uric acid during a sick admission in past so possible gout, no open source for infection. If uric acid  elevation give prednisone taper Pain medication given percocet #20    Relevant Orders    Uric Acid    CBC with Differential/Platelet       Note: This dictation was prepared with Dragon dictation along with smaller phrase technology. Any transcriptional errors that result from this process are unintentional.

## 2015-12-17 NOTE — Patient Instructions (Addendum)
Elevate foot Use epsom salt   I will call with lab results  Take pain medication  Uric acid  F/U as previous

## 2015-12-18 LAB — CBC WITH DIFFERENTIAL/PLATELET
Basophils Absolute: 0.1 10*3/uL (ref 0.0–0.1)
Basophils Relative: 1 % (ref 0–1)
EOS ABS: 0.2 10*3/uL (ref 0.0–0.7)
Eosinophils Relative: 3 % (ref 0–5)
HCT: 35.2 % — ABNORMAL LOW (ref 36.0–46.0)
HEMOGLOBIN: 11.8 g/dL — AB (ref 12.0–15.0)
LYMPHS ABS: 2.3 10*3/uL (ref 0.7–4.0)
Lymphocytes Relative: 29 % (ref 12–46)
MCH: 31 pg (ref 26.0–34.0)
MCHC: 33.5 g/dL (ref 30.0–36.0)
MCV: 92.4 fL (ref 78.0–100.0)
MONO ABS: 0.5 10*3/uL (ref 0.1–1.0)
MONOS PCT: 7 % (ref 3–12)
MPV: 10.2 fL (ref 8.6–12.4)
NEUTROS PCT: 60 % (ref 43–77)
Neutro Abs: 4.7 10*3/uL (ref 1.7–7.7)
PLATELETS: 227 10*3/uL (ref 150–400)
RBC: 3.81 MIL/uL — ABNORMAL LOW (ref 3.87–5.11)
RDW: 12.5 % (ref 11.5–15.5)
WBC: 7.8 10*3/uL (ref 4.0–10.5)

## 2015-12-18 LAB — URIC ACID: URIC ACID, SERUM: 5.8 mg/dL (ref 2.4–7.0)

## 2015-12-24 ENCOUNTER — Encounter: Payer: Self-pay | Admitting: Gastroenterology

## 2016-01-02 ENCOUNTER — Ambulatory Visit: Admitting: Gastroenterology

## 2016-01-13 ENCOUNTER — Other Ambulatory Visit: Payer: Self-pay | Admitting: Gastroenterology

## 2016-01-17 ENCOUNTER — Ambulatory Visit (HOSPITAL_COMMUNITY)

## 2016-02-04 ENCOUNTER — Telehealth: Payer: Self-pay | Admitting: Family Medicine

## 2016-02-04 NOTE — Telephone Encounter (Signed)
Call placed to patient.   States that she is currently in [place].   Reports that she has increased sinus pressure and frequent HA.   Advised to use OTC Sudafed and nasal saline for congestion. Advised to increase rest and to increase fluid intake. Recommended that if symptoms worsen or persist >1 week to contact office for visit.

## 2016-02-04 NOTE — Telephone Encounter (Signed)
Pt is having frequent headaches and would like to speak with a nurse.  [phone]

## 2016-02-25 ENCOUNTER — Other Ambulatory Visit: Payer: Self-pay | Admitting: Family Medicine

## 2016-02-25 DIAGNOSIS — I10 Essential (primary) hypertension: Secondary | ICD-10-CM

## 2016-02-25 NOTE — Telephone Encounter (Signed)
Wants all these sent to [place] to send all??

## 2016-02-25 NOTE — Telephone Encounter (Signed)
Fenofibrate  zolpidem  Alprazolam  Irbesartan  dexilant  hrdrochlorizide   Latanoprost  Brimonidine  Hydrocortisone acitate  Needs refills on all of these to go to [place] ([facility])

## 2016-02-25 NOTE — Telephone Encounter (Signed)
Okay 

## 2016-02-26 MED ORDER — HYDROCHLOROTHIAZIDE 12.5 MG PO TABS: 12.5000 mg | ORAL_TABLET | Freq: Every day | ORAL | Status: DC

## 2016-02-26 MED ORDER — ZOLPIDEM TARTRATE 10 MG PO TABS: ORAL_TABLET | ORAL | Status: DC

## 2016-02-26 MED ORDER — IRBESARTAN 150 MG PO TABS: 75.0000 mg | ORAL_TABLET | Freq: Every day | ORAL | Status: DC

## 2016-02-26 MED ORDER — LATANOPROST 0.005 % OP SOLN: 1.0000 [drp] | Freq: Every day | OPHTHALMIC | Status: DC

## 2016-02-26 MED ORDER — BRIMONIDINE TARTRATE 0.2 % OP SOLN: 1.0000 [drp] | Freq: Two times a day (BID) | OPHTHALMIC | Status: DC

## 2016-02-26 MED ORDER — HYDROCORTISONE ACETATE 25 MG RE SUPP: 25.0000 mg | Freq: Two times a day (BID) | RECTAL | Status: DC | PRN

## 2016-02-26 MED ORDER — DEXLANSOPRAZOLE 60 MG PO CPDR: 60.0000 mg | DELAYED_RELEASE_CAPSULE | Freq: Every day | ORAL | Status: DC

## 2016-02-26 MED ORDER — ALPRAZOLAM 1 MG PO TABS: 1.0000 mg | ORAL_TABLET | Freq: Two times a day (BID) | ORAL | Status: DC | PRN

## 2016-02-26 MED ORDER — FENOFIBRATE 145 MG PO TABS: 145.0000 mg | ORAL_TABLET | Freq: Every day | ORAL | Status: DC

## 2016-02-26 NOTE — Telephone Encounter (Signed)
meds sent/faxed to pharmacy

## 2016-02-27 ENCOUNTER — Other Ambulatory Visit: Payer: Self-pay

## 2016-02-27 ENCOUNTER — Encounter: Payer: Self-pay | Admitting: Gastroenterology

## 2016-02-27 ENCOUNTER — Ambulatory Visit (INDEPENDENT_AMBULATORY_CARE_PROVIDER_SITE_OTHER): Admitting: Gastroenterology

## 2016-02-27 VITALS — BP 115/75 | HR 94 | Temp 99.0°F | Ht 67.0 in | Wt 211.4 lb

## 2016-02-27 DIAGNOSIS — K5901 Slow transit constipation: Secondary | ICD-10-CM

## 2016-02-27 DIAGNOSIS — Z1211 Encounter for screening for malignant neoplasm of colon: Secondary | ICD-10-CM | POA: Diagnosis not present

## 2016-02-27 DIAGNOSIS — K219 Gastro-esophageal reflux disease without esophagitis: Secondary | ICD-10-CM

## 2016-02-27 DIAGNOSIS — K582 Mixed irritable bowel syndrome: Secondary | ICD-10-CM | POA: Diagnosis not present

## 2016-02-27 MED ORDER — PEG-KCL-NACL-NASULF-NA ASC-C 100 G PO SOLR
1.0000 | ORAL | Status: DC
Start: 2016-02-27 — End: 2016-03-31

## 2016-02-27 NOTE — Assessment & Plan Note (Signed)
SYMPTOMS CONTROLLED/RESOLVED. NO WARNING SIGNS/SYMPTOMS  CONTINUE TO MONITOR SYMPTOMS.

## 2016-02-27 NOTE — Patient Instructions (Signed)
COLONOSCOPY with propofol within the next 3-4 weeks.  FULL LIQUIDS WITH BREAKFAST on day before. See info below.  MOVI-PREP on day prior to colonoscopy.  FOLLOW UP IN 6 MOS.

## 2016-02-27 NOTE — Assessment & Plan Note (Signed)
SYMPTOMS CONTROLLED/RESOLVED.  CONTINUE TO MONITOR SYMPTOMS. 

## 2016-02-27 NOTE — Progress Notes (Signed)
ON RECALL  °
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
## 2016-02-27 NOTE — Assessment & Plan Note (Signed)
SYMPTOMS CONTROLLED/RESOLVED.  CONTINUE TO MONITOR SYMPTOMS. DEXILANT QD AVOID TRIGGERS

## 2016-02-27 NOTE — Assessment & Plan Note (Signed)
FAMILY Hx COLON CA-MOTHER AND MULTIPLE 2ND DEGREE RELATIVES HAD COLON CA AGE > 60.  COLONOSCOPY with propofol within the next 3-4 weeks. DISCUSSED PROCEDURE, BENEFITS, & RISKS. FULL LIQUIDS WITH BREAKFAST on day before. See info below. MOVI-PREP on day prior to colonoscopy.  FOLLOW UP IN 6 MOS.

## 2016-02-27 NOTE — Progress Notes (Signed)
cc'ed to pcp °

## 2016-03-12 ENCOUNTER — Other Ambulatory Visit: Payer: Self-pay | Admitting: Family Medicine

## 2016-03-12 DIAGNOSIS — Z1231 Encounter for screening mammogram for malignant neoplasm of breast: Secondary | ICD-10-CM

## 2016-03-25 ENCOUNTER — Encounter (HOSPITAL_COMMUNITY): Payer: Self-pay

## 2016-03-25 ENCOUNTER — Encounter (HOSPITAL_COMMUNITY)
Admission: RE | Admit: 2016-03-25 | Discharge: 2016-03-25 | Disposition: A | Discharge status: Home / Self Care | Source: Ambulatory Visit | Attending: Gastroenterology | Admitting: Gastroenterology

## 2016-03-25 ENCOUNTER — Ambulatory Visit (HOSPITAL_COMMUNITY)
Admission: RE | Admit: 2016-03-25 | Discharge: 2016-03-25 | Disposition: A | Discharge status: Home / Self Care | Source: Ambulatory Visit | Attending: Family Medicine | Admitting: Family Medicine

## 2016-03-25 DIAGNOSIS — K581 Irritable bowel syndrome with constipation: Secondary | ICD-10-CM | POA: Insufficient documentation

## 2016-03-25 DIAGNOSIS — K219 Gastro-esophageal reflux disease without esophagitis: Secondary | ICD-10-CM | POA: Diagnosis present

## 2016-03-25 DIAGNOSIS — Z01812 Encounter for preprocedural laboratory examination: Secondary | ICD-10-CM | POA: Insufficient documentation

## 2016-03-25 DIAGNOSIS — Z1231 Encounter for screening mammogram for malignant neoplasm of breast: Secondary | ICD-10-CM

## 2016-03-25 HISTORY — DX: Gastro-esophageal reflux disease without esophagitis: K21.9

## 2016-03-25 LAB — BASIC METABOLIC PANEL
ANION GAP: 7 (ref 5–15)
BUN: 14 mg/dL (ref 6–20)
CHLORIDE: 102 mmol/L (ref 101–111)
CO2: 28 mmol/L (ref 22–32)
Calcium: 9.6 mg/dL (ref 8.9–10.3)
Creatinine, Ser: 1.04 mg/dL — ABNORMAL HIGH (ref 0.44–1.00)
GFR calc Af Amer: >60 mL/min (ref >60)
GFR, EST NON AFRICAN AMERICAN: 56 mL/min — AB (ref >60)
GLUCOSE: 127 mg/dL — AB (ref 65–99)
POTASSIUM: 3.7 mmol/L (ref 3.5–5.1)
Sodium: 137 mmol/L (ref 135–145)

## 2016-03-25 LAB — CBC WITH DIFFERENTIAL/PLATELET
BASOS PCT: 1 %
Basophils Absolute: 0 10*3/uL (ref 0.0–0.1)
EOS PCT: 4 %
Eosinophils Absolute: 0.2 10*3/uL (ref 0.0–0.7)
HEMATOCRIT: 37.1 % (ref 36.0–46.0)
Hemoglobin: 12.5 g/dL (ref 12.0–15.0)
LYMPHS PCT: 29 %
Lymphs Abs: 1.4 10*3/uL (ref 0.7–4.0)
MCH: 32.3 pg (ref 26.0–34.0)
MCHC: 33.7 g/dL (ref 30.0–36.0)
MCV: 95.9 fL (ref 78.0–100.0)
MONO ABS: 0.4 10*3/uL (ref 0.1–1.0)
MONOS PCT: 7 %
NEUTROS ABS: 2.9 10*3/uL (ref 1.7–7.7)
Neutrophils Relative %: 59 %
PLATELETS: 181 10*3/uL (ref 150–400)
RBC: 3.87 MIL/uL (ref 3.87–5.11)
RDW: 12.8 % (ref 11.5–15.5)
WBC: 5 10*3/uL (ref 4.0–10.5)

## 2016-03-25 NOTE — Patient Instructions (Signed)
[name]  03/25/2016     @PREFPERIOPPHARMACY @   Your procedure is scheduled on  03/31/2016   Report to [facility] at  1130  A.M.  Call this number if you have problems the morning of surgery:  [phone]   Remember:  Do not eat food or drink liquids after midnight.  Take these medicines the morning of surgery with A SIP OF WATER  Xanax, flexaril, dexilant, avapro, claritin, oxycodone, zofran. Use your nebulizer and inhaler before you come. Bring your inhaler with you.   Do not wear jewelry, make-up or nail polish.  Do not wear lotions, powders, or perfumes.  You may wear deoderant.  Do not shave 48 hours prior to surgery.  Men may shave face and neck.  Do not bring valuables to the hospital.  [facility] is not responsible for any belongings or valuables.  Contacts, dentures or bridgework may not be worn into surgery.  Leave your suitcase in the car.  After surgery it may be brought to your room.  For patients admitted to the hospital, discharge time will be determined by your treatment team.  Patients discharged the day of surgery will not be allowed to drive home.   Name and phone number of your driver:   family Special instructions:  Follow the diet and prep instructions given to you by Dr Oneida Alar office.  Please read over the following fact sheets that you were given. Coughing and Deep Breathing, Surgical Site Infection Prevention, Anesthesia Post-op Instructions and Care and Recovery After Surgery      Colonoscopy A colonoscopy is an exam to look at the entire large intestine (colon). This exam can help find problems such as tumors, polyps, inflammation, and areas of bleeding. The exam takes about 1 hour.  LET [facility] CARE PROVIDER KNOW ABOUT:   Any allergies you have.  All medicines you are taking, including vitamins, herbs, eye drops, creams, and over-the-counter medicines.  Previous problems you or members of your family have had with the  use of anesthetics.  Any blood disorders you have.  Previous surgeries you have had.  Medical conditions you have. RISKS AND COMPLICATIONS  Generally, this is a safe procedure. However, as with any procedure, complications can occur. Possible complications include:  Bleeding.  Tearing or rupture of the colon wall.  Reaction to medicines given during the exam.  Infection (rare). BEFORE THE PROCEDURE   Ask your health care provider about changing or stopping your regular medicines.  You may be prescribed an oral bowel prep. This involves drinking a large amount of medicated liquid, starting the day before your procedure. The liquid will cause you to have multiple loose stools until your stool is almost clear or light green. This cleans out your colon in preparation for the procedure.  Do not eat or drink anything else once you have started the bowel prep, unless your health care provider tells you it is safe to do so.  Arrange for someone to drive you home after the procedure. PROCEDURE   You will be given medicine to help you relax (sedative).  You will lie on your side with your knees bent.  A long, flexible tube with a light and camera on the end (colonoscope) will be inserted through the rectum and into the colon. The camera sends video back to a computer screen as it moves through the colon. The colonoscope also releases carbon dioxide gas to inflate the colon.  This helps your health care provider see the area better.  During the exam, your health care provider may take a small tissue sample (biopsy) to be examined under a microscope if any abnormalities are found.  The exam is finished when the entire colon has been viewed. AFTER THE PROCEDURE   Do not drive for 24 hours after the exam.  You may have a small amount of blood in your stool.  You may pass moderate amounts of gas and have mild abdominal cramping or bloating. This is caused by the gas used to inflate your  colon during the exam.  Ask when your test results will be ready and how you will get your results. Make sure you get your test results.   This information is not intended to replace advice given to you by your health care provider. Make sure you discuss any questions you have with your health care provider.   Document Released: 09/18/2000 Document Revised: 07/12/2013 Document Reviewed: 05/29/2013 Elsevier Interactive Patient Education 2016 [employer] Colonoscopy, Care After Refer to this sheet in the next few weeks. These instructions provide you with information on caring for yourself after your procedure. Your health care provider may also give you more specific instructions. Your treatment has been planned according to current medical practices, but problems sometimes occur. Call your health care provider if you have any problems or questions after your procedure. WHAT TO EXPECT AFTER THE PROCEDURE  After your procedure, it is typical to have the following:  A small amount of blood in your stool.  Moderate amounts of gas and mild abdominal cramping or bloating. HOME CARE INSTRUCTIONS  Do not drive, operate machinery, or sign important documents for 24 hours.  You may shower and resume your regular physical activities, but move at a slower pace for the first 24 hours.  Take frequent rest periods for the first 24 hours.  Walk around or put a warm pack on your abdomen to help reduce abdominal cramping and bloating.  Drink enough fluids to keep your urine clear or pale yellow.  You may resume your normal diet as instructed by your health care provider. Avoid heavy or fried foods that are hard to digest.  Avoid drinking alcohol for 24 hours or as instructed by your health care provider.  Only take over-the-counter or prescription medicines as directed by your health care provider.  If a tissue sample (biopsy) was taken during your procedure:  Do not take aspirin or blood  thinners for 7 days, or as instructed by your health care provider.  Do not drink alcohol for 7 days, or as instructed by your health care provider.  Eat soft foods for the first 24 hours. SEEK MEDICAL CARE IF: You have persistent spotting of blood in your stool 2-3 days after the procedure. SEEK IMMEDIATE MEDICAL CARE IF:  You have more than a small spotting of blood in your stool.  You pass large blood clots in your stool.  Your abdomen is swollen (distended).  You have nausea or vomiting.  You have a fever.  You have increasing abdominal pain that is not relieved with medicine.   This information is not intended to replace advice given to you by your health care provider. Make sure you discuss any questions you have with your health care provider.   Document Released: 05/05/2004 Document Revised: 07/12/2013 Document Reviewed: 05/29/2013 Elsevier Interactive Patient Education 2016 [employer] PATIENT INSTRUCTIONS POST-ANESTHESIA  IMMEDIATELY FOLLOWING SURGERY:  Do not drive or operate  machinery for the first twenty four hours after surgery.  Do not make any important decisions for twenty four hours after surgery or while taking narcotic pain medications or sedatives.  If you develop intractable nausea and vomiting or a severe headache please notify your doctor immediately.  FOLLOW-UP:  Please make an appointment with your surgeon as instructed. You do not need to follow up with anesthesia unless specifically instructed to do so.  WOUND CARE INSTRUCTIONS (if applicable):  Keep a dry clean dressing on the anesthesia/puncture wound site if there is drainage.  Once the wound has quit draining you may leave it open to air.  Generally you should leave the bandage intact for twenty four hours unless there is drainage.  If the epidural site drains for more than 36-48 hours please call the anesthesia department.  QUESTIONS?:  Please feel free to call your physician or the hospital  operator if you have any questions, and they will be happy to assist you.

## 2016-03-25 NOTE — Pre-Procedure Instructions (Signed)
Patient given information to sign up for my chart at home. 

## 2016-03-31 ENCOUNTER — Ambulatory Visit (HOSPITAL_COMMUNITY): Admitting: Anesthesiology

## 2016-03-31 ENCOUNTER — Encounter (HOSPITAL_COMMUNITY)
Admission: RE | Disposition: A | Discharge status: Home / Self Care | Payer: Self-pay | Source: Ambulatory Visit | Attending: Gastroenterology

## 2016-03-31 ENCOUNTER — Encounter (HOSPITAL_COMMUNITY): Payer: Self-pay | Admitting: *Deleted

## 2016-03-31 ENCOUNTER — Ambulatory Visit (HOSPITAL_COMMUNITY)
Admission: RE | Admit: 2016-03-31 | Discharge: 2016-03-31 | Disposition: A | Discharge status: Home / Self Care | Source: Ambulatory Visit | Attending: Gastroenterology | Admitting: Gastroenterology

## 2016-03-31 DIAGNOSIS — K635 Polyp of colon: Secondary | ICD-10-CM | POA: Diagnosis not present

## 2016-03-31 DIAGNOSIS — K6389 Other specified diseases of intestine: Secondary | ICD-10-CM | POA: Insufficient documentation

## 2016-03-31 DIAGNOSIS — D573 Sickle-cell trait: Secondary | ICD-10-CM | POA: Diagnosis not present

## 2016-03-31 DIAGNOSIS — Z86711 Personal history of pulmonary embolism: Secondary | ICD-10-CM | POA: Insufficient documentation

## 2016-03-31 DIAGNOSIS — K219 Gastro-esophageal reflux disease without esophagitis: Secondary | ICD-10-CM | POA: Diagnosis not present

## 2016-03-31 DIAGNOSIS — I10 Essential (primary) hypertension: Secondary | ICD-10-CM | POA: Diagnosis not present

## 2016-03-31 DIAGNOSIS — D551 Anemia due to other disorders of glutathione metabolism: Secondary | ICD-10-CM | POA: Diagnosis not present

## 2016-03-31 DIAGNOSIS — Z800 Family history of malignant neoplasm of digestive organs: Secondary | ICD-10-CM | POA: Insufficient documentation

## 2016-03-31 DIAGNOSIS — M1990 Unspecified osteoarthritis, unspecified site: Secondary | ICD-10-CM | POA: Diagnosis not present

## 2016-03-31 DIAGNOSIS — Z79899 Other long term (current) drug therapy: Secondary | ICD-10-CM | POA: Diagnosis not present

## 2016-03-31 DIAGNOSIS — J45909 Unspecified asthma, uncomplicated: Secondary | ICD-10-CM | POA: Diagnosis not present

## 2016-03-31 DIAGNOSIS — K644 Residual hemorrhoidal skin tags: Secondary | ICD-10-CM | POA: Insufficient documentation

## 2016-03-31 DIAGNOSIS — K648 Other hemorrhoids: Secondary | ICD-10-CM | POA: Diagnosis not present

## 2016-03-31 DIAGNOSIS — Z86010 Personal history of colonic polyps: Secondary | ICD-10-CM | POA: Diagnosis not present

## 2016-03-31 DIAGNOSIS — K621 Rectal polyp: Secondary | ICD-10-CM | POA: Insufficient documentation

## 2016-03-31 DIAGNOSIS — Z1211 Encounter for screening for malignant neoplasm of colon: Secondary | ICD-10-CM | POA: Insufficient documentation

## 2016-03-31 DIAGNOSIS — Z9114 Patient's other noncompliance with medication regimen: Secondary | ICD-10-CM | POA: Diagnosis not present

## 2016-03-31 DIAGNOSIS — E7800 Pure hypercholesterolemia, unspecified: Secondary | ICD-10-CM | POA: Insufficient documentation

## 2016-03-31 DIAGNOSIS — F418 Other specified anxiety disorders: Secondary | ICD-10-CM | POA: Insufficient documentation

## 2016-03-31 DIAGNOSIS — Z87891 Personal history of nicotine dependence: Secondary | ICD-10-CM | POA: Insufficient documentation

## 2016-03-31 HISTORY — PX: POLYPECTOMY: SHX5525

## 2016-03-31 HISTORY — PX: COLONOSCOPY WITH PROPOFOL: SHX5780

## 2016-03-31 SURGERY — COLONOSCOPY WITH PROPOFOL
Anesthesia: Monitor Anesthesia Care

## 2016-03-31 MED ORDER — PROPOFOL 10 MG/ML IV BOLUS
INTRAVENOUS | Status: AC
  Filled 2016-03-31: qty 20

## 2016-03-31 MED ORDER — LIDOCAINE HCL (CARDIAC) 10 MG/ML IV SOLN
INTRAVENOUS | Status: DC | PRN
  Administered 2016-03-31: 50 mg via INTRAVENOUS

## 2016-03-31 MED ORDER — MIDAZOLAM HCL 5 MG/5ML IJ SOLN
INTRAMUSCULAR | Status: DC | PRN
  Administered 2016-03-31: 2 mg via INTRAVENOUS

## 2016-03-31 MED ORDER — FENTANYL CITRATE (PF) 100 MCG/2ML IJ SOLN
INTRAMUSCULAR | Status: DC | PRN
  Administered 2016-03-31 (×4): 25 ug via INTRAVENOUS

## 2016-03-31 MED ORDER — FENTANYL CITRATE (PF) 100 MCG/2ML IJ SOLN
INTRAMUSCULAR | Status: AC
  Filled 2016-03-31: qty 2

## 2016-03-31 MED ORDER — PROPOFOL 500 MG/50ML IV EMUL
INTRAVENOUS | Status: DC | PRN
  Administered 2016-03-31: 150 ug/kg/min via INTRAVENOUS

## 2016-03-31 MED ORDER — LACTATED RINGERS IV SOLN
INTRAVENOUS | Status: DC | PRN
  Administered 2016-03-31: 12:00:00 via INTRAVENOUS

## 2016-03-31 MED ORDER — GLYCOPYRROLATE 0.2 MG/ML IJ SOLN
INTRAMUSCULAR | Status: AC
Start: 2016-03-31 — End: 2016-03-31
  Filled 2016-03-31: qty 1

## 2016-03-31 MED ORDER — LIDOCAINE HCL (PF) 1 % IJ SOLN
INTRAMUSCULAR | Status: AC
  Filled 2016-03-31: qty 10

## 2016-03-31 MED ORDER — LIDOCAINE-HYDROCORTISONE ACE 3-2.5 % RE KIT: PACK | RECTAL | Status: DC

## 2016-03-31 MED ORDER — SIMETHICONE 40 MG/0.6ML PO SUSP
ORAL | Status: DC | PRN
  Administered 2016-03-31: 16:00:00

## 2016-03-31 MED ORDER — MIDAZOLAM HCL 2 MG/2ML IJ SOLN
INTRAMUSCULAR | Status: AC
  Filled 2016-03-31: qty 2

## 2016-03-31 MED ORDER — PROPOFOL 10 MG/ML IV BOLUS
INTRAVENOUS | Status: AC
Start: 2016-03-31 — End: 2016-03-31
  Filled 2016-03-31: qty 20

## 2016-03-31 MED ORDER — LACTATED RINGERS IV SOLN: INTRAVENOUS | Status: DC

## 2016-03-31 NOTE — Anesthesia Postprocedure Evaluation (Signed)
Anesthesia Post Note  Patient: [name]  Procedure(s) Performed: Procedure(s) (LRB): COLONOSCOPY WITH PROPOFOL (N/A) POLYPECTOMY  Patient location during evaluation: PACU Anesthesia Type: MAC Level of consciousness: awake and alert and oriented Pain management: pain level controlled Vital Signs Assessment: post-procedure vital signs reviewed and stable Respiratory status: spontaneous breathing, respiratory function stable and patient connected to nasal cannula oxygen Cardiovascular status: stable Postop Assessment: no signs of nausea or vomiting Anesthetic complications: no    Last Vitals:  Filed Vitals:   03/31/16 1420 03/31/16 1450  BP: 138/80 142/86  Pulse:    Temp:    Resp: 33 22    Last Pain: There were no vitals filed for this visit.               [name] A

## 2016-03-31 NOTE — Discharge Instructions (Addendum)
You have LARGE Internal AND EXTERNAL hemorrhoids and diverticulosis IN YOUR RIGHT COLON. YOU HAD FOUR SMALL POLYPS REMOVED.   DRINK WATER TO KEEP YOUR URINE LIGHT YELLOW.  FOLLOW A HIGH FIBER DIET. AVOID ITEMS THAT CAUSE BLOATING. See info below.  YOUR BIOPSY RESULTS WILL BE AVAILABLE IN MY CHART JUN 30 AND MY OFFICE WILL CONTACT YOU IN 10-14 DAYS WITH YOUR RESULTS.   USE APOTHECARY CREAM AND/OR ANUSOL SUPPOSITORIES TWICE DAILY FOR 7-10 DAYS TO RELIEVE RECTAL PAIN/PRESSURE/BLEEDING.   SEE SURGERY TO FIX YOUR HEMORRHOIDS.  Next colonoscopy in 5 years.  Colonoscopy Care After Read the instructions outlined below and refer to this sheet in the next week. These discharge instructions provide you with general information on caring for yourself after you leave the hospital. While your treatment has been planned according to the most current medical practices available, unavoidable complications occasionally occur. If you have any problems or questions after discharge, call DR. Alesandro Stueve, [phone].  ACTIVITY  You may resume your regular activity, but move at a slower pace for the next 24 hours.   Take frequent rest periods for the next 24 hours.   Walking will help get rid of the air and reduce the bloated feeling in your belly (abdomen).   No driving for 24 hours (because of the medicine (anesthesia) used during the test).   You may shower.   Do not sign any important legal documents or operate any machinery for 24 hours (because of the anesthesia used during the test).    NUTRITION  Drink plenty of fluids.   You may resume your normal diet as instructed by your doctor.   Begin with a light meal and progress to your normal diet. Heavy or fried foods are harder to digest and may make you feel sick to your stomach (nauseated).   Avoid alcoholic beverages for 24 hours or as instructed.    MEDICATIONS  You may resume your normal medications.   WHAT YOU CAN EXPECT  TODAY  Some feelings of bloating in the abdomen.   Passage of more gas than usual.   Spotting of blood in your stool or on the toilet paper  .  IF YOU HAD POLYPS REMOVED DURING THE COLONOSCOPY:  Eat a soft diet IF YOU HAVE NAUSEA, BLOATING, ABDOMINAL PAIN, OR VOMITING.    FINDING OUT THE RESULTS OF YOUR TEST Not all test results are available during your visit. DR. Oneida Alar WILL CALL YOU WITHIN 14 DAYS OF YOUR PROCEDUE WITH YOUR RESULTS. Do not assume everything is normal if you have not heard from DR. Betzy Barbier, CALL HER OFFICE AT [phone].  SEEK IMMEDIATE MEDICAL ATTENTION AND CALL THE OFFICE: [phone] IF:  You have more than a spotting of blood in your stool.   Your belly is swollen (abdominal distention).   You are nauseated or vomiting.   You have a temperature over 101F.   You have abdominal pain or discomfort that is severe or gets worse throughout the day.  High-Fiber Diet A high-fiber diet changes your normal diet to include more whole grains, legumes, fruits, and vegetables. Changes in the diet involve replacing refined carbohydrates with unrefined foods. The calorie level of the diet is essentially unchanged. The Dietary Reference Intake (recommended amount) for adult males is 38 grams per day. For adult females, it is 25 grams per day. Pregnant and lactating women should consume 28 grams of fiber per day. Fiber is the intact part of a plant that is not broken down during digestion.  Functional fiber is fiber that has been isolated from the plant to provide a beneficial effect in the body. PURPOSE  Increase stool bulk.   Ease and regulate bowel movements.   Lower cholesterol.  INDICATIONS THAT YOU NEED MORE FIBER  Constipation and hemorrhoids.   Uncomplicated diverticulosis (intestine condition) and irritable bowel syndrome.   Weight management.   As a protective measure against hardening of the arteries (atherosclerosis), diabetes, and cancer.    GUIDELINES FOR INCREASING FIBER IN THE DIET  Start adding fiber to the diet slowly. A gradual increase of about 5 more grams (2 slices of whole-wheat bread, 2 servings of most fruits or vegetables, or 1 bowl of high-fiber cereal) per day is best. Too rapid an increase in fiber may result in constipation, flatulence, and bloating.   Drink enough water and fluids to keep your urine clear or pale yellow. Water, juice, or caffeine-free drinks are recommended. Not drinking enough fluid may cause constipation.   Eat a variety of high-fiber foods rather than one type of fiber.   Try to increase your intake of fiber through using high-fiber foods rather than fiber pills or supplements that contain small amounts of fiber.   The goal is to change the types of food eaten. Do not supplement your present diet with high-fiber foods, but replace foods in your present diet.  INCLUDE A VARIETY OF FIBER SOURCES  Replace refined and processed grains with whole grains, canned fruits with fresh fruits, and incorporate other fiber sources. White rice, white breads, and most bakery goods contain little or no fiber.   Brown whole-grain rice, buckwheat oats, and many fruits and vegetables are all good sources of fiber. These include: broccoli, Brussels sprouts, cabbage, cauliflower, beets, sweet potatoes, white potatoes (skin on), carrots, tomatoes, eggplant, squash, berries, fresh fruits, and dried fruits.   Cereals appear to be the richest source of fiber. Cereal fiber is found in whole grains and bran. Bran is the fiber-rich outer coat of cereal grain, which is largely removed in refining. In whole-grain cereals, the bran remains. In breakfast cereals, the largest amount of fiber is found in those with "bran" in their names. The fiber content is sometimes indicated on the label.   You may need to include additional fruits and vegetables each day.   In baking, for 1 cup white flour, you may use the following  substitutions:   1 cup whole-wheat flour minus 2 tablespoons.   1/2 cup white flour plus 1/2 cup whole-wheat flour.   Polyps, Colon  A polyp is extra tissue that grows inside your body. Colon polyps grow in the large intestine. The large intestine, also called the colon, is part of your digestive system. It is a long, hollow tube at the end of your digestive tract where your body makes and stores stool. Most polyps are not dangerous. They are benign. This means they are not cancerous. But over time, some types of polyps can turn into cancer. Polyps that are smaller than a pea are usually not harmful. But larger polyps could someday become or may already be cancerous. To be safe, doctors remove all polyps and test them.   WHO GETS POLYPS? Anyone can get polyps, but certain people are more likely than others. You may have a greater chance of getting polyps if:  You are over 50.   You have had polyps before.   Someone in your family has had polyps.   Someone in your family has had cancer of the  large intestine.   Find out if someone in your family has had polyps. You may also be more likely to get polyps if you:   Eat a lot of fatty foods   Smoke   Drink alcohol   Do not exercise  Eat too much   TREATMENT  The caregiver will remove the polyp during sigmoidoscopy or colonoscopy.  PREVENTION There is not one sure way to prevent polyps. You might be able to lower your risk of getting them if you:  Eat more fruits and vegetables and less fatty food.   Do not smoke.   Avoid alcohol.   Exercise every day.   Lose weight if you are overweight.   Eating more calcium and folate can also lower your risk of getting polyps. Some foods that are rich in calcium are milk, cheese, and broccoli. Some foods that are rich in folate are chickpeas, kidney beans, and spinach.    Diverticulosis Diverticulosis is a common condition that develops when small pouches (diverticula) form in the wall  of the colon. The risk of diverticulosis increases with age. It happens more often in people who eat a low-fiber diet. Most individuals with diverticulosis have no symptoms. Those individuals with symptoms usually experience belly (abdominal) pain, constipation, or loose stools (diarrhea).  HOME CARE INSTRUCTIONS  Increase the amount of fiber in your diet as directed by your caregiver or dietician. This may reduce symptoms of diverticulosis.   Drink at least 6 to 8 glasses of water each day to prevent constipation.   Try not to strain when you have a bowel movement.   Avoiding nuts and seeds to prevent complications is NOT NECESSARY.   FOODS HAVING HIGH FIBER CONTENT INCLUDE:  Fruits. Apple, peach, pear, tangerine, raisins, prunes.   Vegetables. Brussels sprouts, asparagus, broccoli, cabbage, carrot, cauliflower, romaine lettuce, spinach, summer squash, tomato, winter squash, zucchini.   Starchy Vegetables. Baked beans, kidney beans, lima beans, split peas, lentils, potatoes (with skin).   Grains. Whole wheat bread, brown rice, bran flake cereal, plain oatmeal, white rice, shredded wheat, bran muffins.    SEEK IMMEDIATE MEDICAL CARE IF:  You develop increasing pain or severe bloating.   You have an oral temperature above 101F.   You develop vomiting or bowel movements that are bloody or black.   Hemorrhoids Hemorrhoids are dilated (enlarged) veins around the rectum. Sometimes clots will form in the veins. This makes them swollen and painful. These are called thrombosed hemorrhoids. Causes of hemorrhoids include:  Constipation.   Straining to have a bowel movement.   HEAVY LIFTING  HOME CARE INSTRUCTIONS  Eat a well balanced diet and drink 6 to 8 glasses of water every day to avoid constipation. You may also use a bulk laxative.   Avoid straining to have bowel movements.   Keep anal area dry and clean.   Do not use a donut shaped pillow or sit on the toilet for long  periods. This increases blood pooling and pain.   Move your bowels when your body has the urge; this will require less straining and will decrease pain and pressure.

## 2016-03-31 NOTE — Anesthesia Procedure Notes (Signed)
Procedure Name: MAC Date/Time: 03/31/2016 3:51 PM Performed by: Andree Elk, Vergia Chea A Pre-anesthesia Checklist: Patient identified, Emergency Drugs available and Suction available Patient Re-evaluated:Patient Re-evaluated prior to inductionOxygen Delivery Method: Simple face mask
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
## 2016-03-31 NOTE — Transfer of Care (Signed)
Immediate Anesthesia Transfer of Care Note  Patient: [name]  Procedure(s) Performed: Procedure(s) with comments: COLONOSCOPY WITH PROPOFOL (N/A) - 115 - to 1:00 POLYPECTOMY - sigmoid colon polyps x2, rectal polyps x2  Patient Location: PACU  Anesthesia Type:MAC  Level of Consciousness: awake, alert , oriented and patient cooperative  Airway & Oxygen Therapy: Patient Spontanous Breathing and Patient connected to nasal cannula oxygen  Post-op Assessment: Report given to RN and Post -op Vital signs reviewed and stable  Post vital signs: Reviewed and stable  Last Vitals:  Filed Vitals:   03/31/16 1420 03/31/16 1450  BP: 138/80 142/86  Pulse:    Temp:    Resp: 33 22    Last Pain: There were no vitals filed for this visit.    Patients Stated Pain Goal: 5 (75/05/18 3358)  Complications: No apparent anesthesia complications

## 2016-03-31 NOTE — Anesthesia Preprocedure Evaluation (Addendum)
Anesthesia Evaluation  Patient identified by MRN, date of birth, ID band Patient awake    Reviewed: Allergy & Precautions, NPO status , Patient's Chart, lab work & pertinent test results  History of Anesthesia Complications (+) PONV and history of anesthetic complications  Airway Mallampati: III  TM Distance: >3 FB Neck ROM: Full    Dental  (+) Teeth Intact   Pulmonary asthma , former smoker,    breath sounds clear to auscultation       Cardiovascular hypertension,  Rhythm:Regular Rate:Normal     Neuro/Psych PSYCHIATRIC DISORDERS Anxiety Depression  Neuromuscular disease    GI/Hepatic GERD  ,(+) Hepatitis -  Endo/Other    Renal/GU CRFRenal disease  negative genitourinary   Musculoskeletal  (+) Arthritis ,   Abdominal   Peds negative pediatric ROS (+)  Hematology negative hematology ROS (+) Sickle cell trait ,   Anesthesia Other Findings - G6PD Def. - HLD - Blindness - h/o PE  Reproductive/Obstetrics negative OB ROS                            Lab Results  Component Value Date   WBC 5.0 03/25/2016   HGB 12.5 03/25/2016   HCT 37.1 03/25/2016   MCV 95.9 03/25/2016   PLT 181 03/25/2016   Lab Results  Component Value Date   CREATININE 1.04* 03/25/2016   BUN 14 03/25/2016   NA 137 03/25/2016   K 3.7 03/25/2016   CL 102 03/25/2016   CO2 28 03/25/2016   Lab Results  Component Value Date   INR 1.09 07/26/2015   INR 1.05 03/31/2014   07/2015 EKG: normal sinus rhythm, frequent PVC's noted.  07/2015 Echo - Left ventricle: The cavity size was normal. Wall thickness was normal. Systolic function was normal. The estimated ejection fraction was in the range of 60% to 65%. Wall motion was normal; there were no regional wall motion abnormalities. - Aortic valve: Mildly calcified annulus. Trileaflet. - Mitral valve: Mildly calcified annulus. - Systemic veins: IVC mildly dilated with  normal respiratory variation. Estimated CVP 8 mmHg.     Anesthesia Physical Anesthesia Plan  ASA: III  Anesthesia Plan: MAC   Post-op Pain Management:    Induction: Intravenous  Airway Management Planned: Natural Airway and Nasal Cannula  Additional Equipment:   Intra-op Plan:   Post-operative Plan:   Informed Consent: I have reviewed the patients History and Physical, chart, labs and discussed the procedure including the risks, benefits and alternatives for the proposed anesthesia with the patient or authorized representative who has indicated his/her understanding and acceptance.     Plan Discussed with: CRNA  Anesthesia Plan Comments:         Anesthesia Quick Evaluation

## 2016-04-01 ENCOUNTER — Other Ambulatory Visit: Payer: Self-pay

## 2016-04-01 DIAGNOSIS — K649 Unspecified hemorrhoids: Secondary | ICD-10-CM

## 2016-04-01 NOTE — Op Note (Signed)
[facility] Patient Name: [name] Procedure Date: 03/31/2016 3:57 PM MRN: [id number] Date of Birth: 02-15-1951 Attending MD: Barney Drain , MD CSN: [id number] Age: 65 Admit Type: Outpatient Procedure:                Colonoscopy WITH COLD FORCEPS POLYPECTOMY Indications:              High risk colon cancer surveillance: Personal                            history of colonic polyps COLON CANCER IN ONE FIRST                            DEGREE AND THREE SECOND DEGREE RELATIVES. Providers:                Barney Drain, MD, Tammy Vaught, RN, Purcell Nails                            Tina Griffiths, Technician Referring MD:             Modena Nunnery. [place] Medicines:                Propofol per Anesthesia Complications:            No immediate complications. Estimated Blood Loss:     Estimated blood loss was minimal. Procedure:                Pre-Anesthesia Assessment:                           - Prior to the procedure, a History and Physical                            was performed, and patient medications and                            allergies were reviewed. The patient's tolerance of                            previous anesthesia was also reviewed. The risks                            and benefits of the procedure and the sedation                            options and risks were discussed with the patient.                            All questions were answered, and informed consent                            was obtained. Prior Anticoagulants: The patient has                            taken no previous anticoagulant or antiplatelet  agents. ASA Grade Assessment: II - A patient with                            mild systemic disease. After reviewing the risks                            and benefits, the patient was deemed in                            satisfactory condition to undergo the procedure.                           After obtaining informed consent, the  colonoscope                            was passed under direct vision. Throughout the                            procedure, the patient's blood pressure, pulse, and                            oxygen saturations were monitored continuously. The                            EC-[id number] ([id number]) scope was introduced through                            the anus and advanced to the the cecum, identified                            by appendiceal orifice and ileocecal valve. The                            colonoscopy was performed without difficulty. The                            patient tolerated the procedure well. The quality                            of the bowel preparation was excellent. The                            ileocecal valve, appendiceal orifice, and rectum                            were photographed. Scope In: 4:02:16 PM Scope Out: 4:13:11 PM Scope Withdrawal Time: 0 hours 9 minutes 17 seconds  Total Procedure Duration: 0 hours 10 minutes 55 seconds  Findings:      Four sessile polyps were found in the rectum and sigmoid colon. The       polyps were 2 to 4 mm in size. These polyps were removed with a cold       biopsy forceps. Resection and retrieval were complete.      A few small-mouthed diverticula were  found in the sigmoid colon and       ascending colon.      A diffuse area of moderate melanosis was found in the entire colon.      External and internal hemorrhoids were found. The hemorrhoids were       severe. Impression:               - Four 2 to 4 mm polyps in the rectum and in the                            sigmoid colon, removed with a cold biopsy forceps.                            Resected and retrieved.                           - Diverticulosis in the sigmoid colon and in the                            ascending colon.                           - Melanosis in the colon.                           - External and internal hemorrhoids. Moderate Sedation:      Per  Anesthesia Care Recommendation:           - High fiber diet.                           - Continue present medications.                           - Await pathology results.                           - Repeat colonoscopy in 5 years for surveillance.                           - Return to GI office in 4 months.                           DRINK WATER TO KEEP YOUR URINE LIGHT YELLOW.                           FOLLOW A HIGH FIBER DIET. AVOID ITEMS THAT CAUSE                            BLOATING.                           USE APOTHECARY CREAM AND/OR ANUSOL SUPPOSITORIES                            TWICE DAILY FOR 7-10 DAYS TO RELIEVE RECTAL  PAIN/PRESSURE/BLEEDING.                           SEE SURGERY TO DISCUSS THE BENEFITS V. RISKS OF                            HEMORRHOIDECTOMY                           - Patient has a contact number available for                            emergencies. The signs and symptoms of potential                            delayed complications were discussed with the                            patient. Return to normal activities tomorrow.                            Written discharge instructions were provided to the                            patient. Procedure Code(s):        --- Professional ---                           [phone], Colonoscopy, flexible; with biopsy, single                            or multiple Diagnosis Code(s):        --- Professional ---                           Z86.010, Personal history of colonic polyps                           K62.1, Rectal polyp                           D12.5, Benign neoplasm of sigmoid colon                           K64.8, Other hemorrhoids                           K63.89, Other specified diseases of intestine                           K57.30, Diverticulosis of large intestine without                            perforation or abscess without bleeding CPT copyright 2016 American Medical  Association. All rights reserved. The codes documented in this report are preliminary and upon coder review may  be revised to meet current compliance requirements. Barney Drain, MD Barney Drain, MD  04/01/2016 12:09:39 AM This report has been signed electronically. Number of Addenda: 0

## 2016-04-03 ENCOUNTER — Encounter (HOSPITAL_COMMUNITY): Payer: Self-pay | Admitting: Gastroenterology

## 2016-04-10 ENCOUNTER — Ambulatory Visit: Admitting: Family Medicine

## 2016-04-17 ENCOUNTER — Telehealth: Payer: Self-pay | Admitting: Gastroenterology

## 2016-04-17 ENCOUNTER — Encounter: Payer: Self-pay | Admitting: Family Medicine

## 2016-04-17 ENCOUNTER — Ambulatory Visit (INDEPENDENT_AMBULATORY_CARE_PROVIDER_SITE_OTHER): Admitting: Family Medicine

## 2016-04-17 VITALS — BP 124/74 | HR 72 | Temp 98.8°F | Resp 14 | Ht 67.0 in | Wt 214.0 lb

## 2016-04-17 DIAGNOSIS — L719 Rosacea, unspecified: Secondary | ICD-10-CM

## 2016-04-17 DIAGNOSIS — D239 Other benign neoplasm of skin, unspecified: Secondary | ICD-10-CM | POA: Diagnosis not present

## 2016-04-17 DIAGNOSIS — K7581 Nonalcoholic steatohepatitis (NASH): Secondary | ICD-10-CM

## 2016-04-17 DIAGNOSIS — J4520 Mild intermittent asthma, uncomplicated: Secondary | ICD-10-CM | POA: Diagnosis not present

## 2016-04-17 DIAGNOSIS — E669 Obesity, unspecified: Secondary | ICD-10-CM | POA: Diagnosis not present

## 2016-04-17 DIAGNOSIS — N182 Chronic kidney disease, stage 2 (mild): Secondary | ICD-10-CM

## 2016-04-17 DIAGNOSIS — D229 Melanocytic nevi, unspecified: Secondary | ICD-10-CM

## 2016-04-17 DIAGNOSIS — I10 Essential (primary) hypertension: Secondary | ICD-10-CM | POA: Diagnosis not present

## 2016-04-17 DIAGNOSIS — E785 Hyperlipidemia, unspecified: Secondary | ICD-10-CM | POA: Diagnosis not present

## 2016-04-17 MED ORDER — CLINDAMYCIN PHOSPHATE 1 % EX LOTN: TOPICAL_LOTION | Freq: Two times a day (BID) | CUTANEOUS | Status: DC

## 2016-04-17 NOTE — Assessment & Plan Note (Signed)
Blood pressure controlledher medication

## 2016-04-17 NOTE — Assessment & Plan Note (Signed)
Check fasting labs today continue to work on dietary changes and exercise for weight loss

## 2016-04-17 NOTE — Patient Instructions (Addendum)
We will call with lab results New roscea/acne cream twice a day - Clindamycin lotion F/U nov

## 2016-04-17 NOTE — Assessment & Plan Note (Signed)
Trial of clindamycin lotion

## 2016-04-17 NOTE — Telephone Encounter (Signed)
I called pt and told her Dr. Oneida Alar is on vacation, but I reviewed her path and informed her the polyps were all hyperplastic. I will call her if Dr. Oneida Alar has further info when she reviews.

## 2016-04-17 NOTE — Progress Notes (Signed)
Patient ID: [name], female   DOB: 24-May-1951, 65 y.o.   MRN: [id number]    Subjective:    Patient ID: [name], female    DOB: 01-17-1951, 65 y.o.   MRN: [id number]  Patient presents for 4 month F/U  Patient to follow chronic medical problems. She is scheduled to have hemorrhoidectomy next week she had a colonoscopy which showed some hyperplastic polyps but multiple internal hemorrhoids and she is having bleeding with bowel movements. She's been increasing her fiber and trying to watch her diet.  She is taking her blood pressure medicine as prescribed  She has rosacea with adult acne she would like to try the second medication recommended by dermatology which was a clindamycin topical  Asthma she's been doing well she did have a mild flare which she will to [place] she was exposed to some new environmental triggers but now she is back at her baseline     Review Of Systems:  GEN- denies fatigue, fever, weight loss,weakness, recent illness HEENT- denies eye drainage, change in vision, nasal discharge, CVS- denies chest pain, palpitations RESP- denies SOB, cough, wheeze ABD- denies N/V, change in stools, abd pain GU- denies dysuria, hematuria, dribbling, incontinence MSK- + joint pain, muscle aches, injury Neuro- denies headache, dizziness, syncope, seizure activity       Objective:    BP 124/74 mmHg  Pulse 72  Temp(Src) 98.8 F (37.1 C) (Oral)  Resp 14  Ht 5' 7"  (1.702 m)  Wt 214 lb (97.07 kg)  BMI 33.51 kg/m2 GEN- NAD, alert and oriented x3 HEENT- PERRL, EOMI, non injected sclera, pink conjunctiva, MMM, oropharynx clear CVS- RRR, no murmur RESP-CTAB EXT- No edema Pulses- Radial, DP- 2+        Assessment & Plan:      Problem List Items Addressed This Visit    None      Note: This dictation was prepared with Dragon dictation along with smaller phrase technology. Any transcriptional errors that result from this process are unintentional.

## 2016-04-17 NOTE — Assessment & Plan Note (Signed)
Currently stable no change to albuterol

## 2016-04-17 NOTE — Telephone Encounter (Signed)
Pt called asking if her results were available. She had procedure done on June 27. Please call her back at [phone]

## 2016-04-18 LAB — COMPREHENSIVE METABOLIC PANEL WITH GFR
ALT: 48 U/L — ABNORMAL HIGH (ref 6–29)
AST: 69 U/L — ABNORMAL HIGH (ref 10–35)
Albumin: 4.5 g/dL (ref 3.6–5.1)
Alkaline Phosphatase: 38 U/L (ref 33–130)
BUN: 17 mg/dL (ref 7–25)
CO2: 19 mmol/L — ABNORMAL LOW (ref 20–31)
Calcium: 10.2 mg/dL (ref 8.6–10.4)
Chloride: 98 mmol/L (ref 98–110)
Creat: 0.9 mg/dL (ref 0.50–0.99)
Glucose, Bld: 140 mg/dL — ABNORMAL HIGH (ref 70–99)
Potassium: 4 mmol/L (ref 3.5–5.3)
Sodium: 133 mmol/L — ABNORMAL LOW (ref 135–146)
Total Bilirubin: 0.7 mg/dL (ref 0.2–1.2)
Total Protein: 7.5 g/dL (ref 6.1–8.1)

## 2016-04-18 LAB — CBC WITH DIFFERENTIAL/PLATELET
Basophils Absolute: 69 {cells}/uL (ref 0–200)
Basophils Relative: 1 %
Eosinophils Absolute: 414 {cells}/uL (ref 15–500)
Eosinophils Relative: 6 %
HCT: 39.6 % (ref 35.0–45.0)
Hemoglobin: 13.4 g/dL (ref 12.0–15.0)
Lymphocytes Relative: 26 %
Lymphs Abs: 1794 {cells}/uL (ref 850–3900)
MCH: 32.3 pg (ref 27.0–33.0)
MCHC: 33.8 g/dL (ref 32.0–36.0)
MCV: 95.4 fL (ref 80.0–100.0)
MPV: 9.9 fL (ref 7.5–12.5)
Monocytes Absolute: 552 {cells}/uL (ref 200–950)
Monocytes Relative: 8 %
Neutro Abs: 4071 {cells}/uL (ref 1500–7800)
Neutrophils Relative %: 59 %
Platelets: 206 10*3/uL (ref 140–400)
RBC: 4.15 MIL/uL (ref 3.80–5.10)
RDW: 13.4 % (ref 11.0–15.0)
WBC: 6.9 10*3/uL (ref 3.8–10.8)

## 2016-04-18 LAB — LIPID PANEL
Cholesterol: 176 mg/dL (ref 125–200)
HDL: 26 mg/dL — ABNORMAL LOW
Total CHOL/HDL Ratio: 6.8 ratio — ABNORMAL HIGH
Triglycerides: 406 mg/dL — ABNORMAL HIGH

## 2016-04-20 NOTE — H&P (Signed)
Surgical History & Physical  Subjective: 65 year old female presents forincreasingly painful severe internal and external hemorrhoids she reports she's had for >30 years before the pain began to become intolerable more recently. She reports that in 2015 she underwent banding of her hemorrhoids, but feels that the pain has continued to worsen despite endoscopic hemorrhoidal banding. She describes that she already takes a stool softener and fiber supplement to (successfully) facilitate bowel movements, but despite her efforts and regular, easier bowel movements without straining, her pain as continued to worsen. Patient states that since her husband died, she has been spending much of her time traveling, and her hemorrhoidal pain makes sitting in airplane seats for prolonged flights particularly uncomfortable and painful. Patient is accordingly referred from Gastroenterology for further management and treatment.  Review of Symptoms:  Constitutional:No fevers, chills, or unexplained weight loss Head:Atraumatic; no masses; no abnormalities Eyes:No visual changes or eye pain Nose/Mouth/Throat:No nasal congestion, rhinorrhea, oral lesions, postnasal drip or sore throat  Cardiovascular: No chest pain or palpitations.  Respiratory:No cough, shortness of breath or wheezing  Gastrointestinal: Frequent blood in stools with hemorrhoids as only explanation on recent colonoscopy; No diarrhea, constipation, abdominal pain, vomiting or heartburn Genitourinary:No urinary frequency, hematuria, incontinence, or dysuria Musculoskeletal:No arthalgias, myalgias or joint swelling Skin:No rash or bothersome skin lesions  Past Medical History:  Irritable bowel syndrome, GERD, HTN, hyperlipidemia, renal cysts, nephrolithiasis, fatty liver, sickle cell trait, G6PD deficiency, asthma, legally blind, glaucoma  Surgical History:   Cholecystectomy, appendectomy, hysterectomy, cataract extraction, laparoscopic  lysis of adhesions, colonoscopy with tubular adenoma polypectomy, EGD  Psychiatric History:   Generalized anxiety, depression  Social History: Preferred Language: English Race:  White Ethnicity: Not Hispanic / Latino Age: 65 year Marital Status:  S Smoking Status: Never smoker reviewed on 04/15/2016  Functional Status ------------------------------------------------ Bathing: Normal Cooking: Normal Dressing: Normal Driving: Normal Eating: Normal Managing Meds: Normal Oral Care: Normal Shopping: Normal Toileting: Normal Transferring: Normal Walking: Normal  Cognitive Status ------------------------------------------------ Attention: Normal Decision Making: Normal Language: Normal Memory: Normal Motor: Normal Perception: Normal Problem Solving: Normal Visual and Spatial: Normal  Family Health History Mother, Deceased at Age 65; Cancer unspecified;  Father, Deceased at Age 7; Cancer unspecified;  Vitals as of 04/04/7792:  Systolic 903: Diastolic 79: Heart Rate 103: Temp 98.57F (Temporal) Height 44f 7in: Weight 213Lbs 0 Ounces: BMI 33.36   Physical Exam: General:Well appearing, well nourished in no distress. Skin:no rash or prominent lesions Head:Atraumatic; no masses; no abnormalities Eyes:conjunctiva clear, EOM intact, PERRL Mouth:Mucous membranes moist, no mucosal lesions. Throat:no erythema, exudates or lesions. Neck:Supple without lymphadenopathy.  Heart:RRR, no murmur Lungs:CTA bilaterally, no wheezes, rhonchi, rales.  Breathing unlabored. Abdomen:Soft, NT/ND, no HSM, no masses.  Extensive tender 2nd degree internal and external hemorrhoids Extremities:No deformities, clubbing, cyanosis, or edema.   Assessment: 65year old Female with increasingly painful 2nd degree internal and external hemorrhoids despite non-operative management and endoscopic hemorrhoidal banding.  Plan:     - Continue current management     - All risks,  benefits, and alternatives to hemorrhoidectomy discussed with the patient, all of her questions answered to her expressed satisfaction, and informed consent was obtained     - Will plan for elective extensive hemorrhoidectomy next week  -- JCorene CorneaE. DRosana Hoes MD, [place] [facility] Office #: [phone]

## 2016-04-20 NOTE — Patient Instructions (Signed)
[name]  04/20/2016     @PREFPERIOPPHARMACY @   Your procedure is scheduled on  04/24/2016  Report to [facility] at  615  A.M.  Call this number if you have problems the morning of surgery:  [phone]   Remember:  Do not eat food or drink liquids after midnight.  Take these medicines the morning of surgery with A SIP OF WATER  Xanax, fleaxaril, dexilant, claritin, zofran, oxycodone.   Do not wear jewelry, make-up or nail polish.  Do not wear lotions, powders, or perfumes.  You may wear deoderant.  Do not shave 48 hours prior to surgery.  Men may shave face and neck.  Do not bring valuables to the hospital.  [facility] is not responsible for any belongings or valuables.  Contacts, dentures or bridgework may not be worn into surgery.  Leave your suitcase in the car.  After surgery it may be brought to your room.  For patients admitted to the hospital, discharge time will be determined by your treatment team.  Patients discharged the day of surgery will not be allowed to drive home.   Name and phone number of your driver:   Sister-in-law. Special instructions:  Take a fleets enema at 10 pm 04/23/2016 and another fleets enema on arrival 04/24/2016.  Please read over the following fact sheets that you were given. Coughing and Deep Breathing, Surgical Site Infection Prevention, Anesthesia Post-op Instructions and Care and Recovery After Surgery      Hemorrhoids Hemorrhoids are puffy (swollen) veins around the rectum or anus. Hemorrhoids can cause pain, itching, bleeding, or irritation. HOME CARE  Eat foods with fiber, such as whole grains, beans, nuts, fruits, and vegetables. Ask your doctor about taking products with added fiber in them (fibersupplements).  Drink enough fluid to keep your pee (urine) clear or pale yellow.  Exercise often.  Go to the bathroom when you have the urge to poop. Do not wait.  Avoid straining to poop (bowel  movement).  Keep the butt area dry and clean. Use wet toilet paper or moist paper towels.  Medicated creams and medicine inserted into the anus (anal suppository) may be used or applied as told.  Only take medicine as told by your doctor.  Take a warm water bath (sitz bath) for 15-20 minutes to ease pain. Do this 3-4 times a day.  Place ice packs on the area if it is tender or puffy. Use the ice packs between the warm water baths.  Put ice in a plastic bag.  Place a towel between your skin and the bag.  Leave the ice on for 15-20 minutes, 03-04 times a day.  Do not use a donut-shaped pillow or sit on the toilet for a long time. GET HELP RIGHT AWAY IF:   You have more pain that is not controlled by treatment or medicine.  You have bleeding that will not stop.  You have trouble or are unable to poop (bowel movement).  You have pain or puffiness outside the area of the hemorrhoids. MAKE SURE YOU:   Understand these instructions.  Will watch your condition.  Will get help right away if you are not doing well or get worse.   This information is not intended to replace advice given to you by your health care provider. Make sure you discuss any questions you have with your health care provider.   Document Released: 06/30/2008 Document Revised: 09/07/2012 Document  Reviewed: 08/02/2012 Elsevier Interactive Patient Education 2016 [place] Anesthesia, Adult, Care After Refer to this sheet in the next few weeks. These instructions provide you with information on caring for yourself after your procedure. Your health care provider may also give you more specific instructions. Your treatment has been planned according to current medical practices, but problems sometimes occur. Call your health care provider if you have any problems or questions after your procedure. WHAT TO EXPECT AFTER THE PROCEDURE After the procedure, it is typical to experience:  Sleepiness.  Nausea  and vomiting. HOME CARE INSTRUCTIONS  For the first 24 hours after general anesthesia:  Have a responsible person with you.  Do not drive a car. If you are alone, do not take public transportation.  Do not drink alcohol.  Do not take medicine that has not been prescribed by your health care provider.  Do not sign important papers or make important decisions.  You may resume a normal diet and activities as directed by your health care provider.  Change bandages (dressings) as directed.  If you have questions or problems that seem related to general anesthesia, call the hospital and ask for the anesthetist or anesthesiologist on call. SEEK MEDICAL CARE IF:  You have nausea and vomiting that continue the day after anesthesia.  You develop a rash. SEEK IMMEDIATE MEDICAL CARE IF:   You have difficulty breathing.  You have chest pain.  You have any allergic problems.   This information is not intended to replace advice given to you by your health care provider. Make sure you discuss any questions you have with your health care provider.   Document Released: 12/28/2000 Document Revised: 10/12/2014 Document Reviewed: 01/20/2012 Elsevier Interactive Patient Education 2016 [place] Anesthesia, Adult General anesthesia is a sleep-like state of non-feeling produced by medicines (anesthetics). General anesthesia prevents you from being alert and feeling pain during a medical procedure. Your caregiver may recommend general anesthesia if your procedure:  Is long.  Is painful or uncomfortable.  Would be frightening to see or hear.  Requires you to be still.  Affects your breathing.  Causes significant blood loss. LET YOUR CAREGIVER KNOW ABOUT:  Allergies to food or medicine.  Medicines taken, including vitamins, herbs, eyedrops, over-the-counter medicines, and creams.  Use of steroids (by mouth or creams).  Previous problems with anesthetics or numbing  medicines, including problems experienced by relatives.  History of bleeding problems or blood clots.  Previous surgeries and types of anesthetics received.  Possibility of pregnancy, if this applies.  Use of cigarettes, alcohol, or illegal drugs.  Any health condition(s), especially diabetes, sleep apnea, and high blood pressure. RISKS AND COMPLICATIONS General anesthesia rarely causes complications. However, if complications do occur, they can be life threatening. Complications include:  A lung infection.  A stroke.  A heart attack.  Waking up during the procedure. When this occurs, the patient may be unable to move and communicate that he or she is awake. The patient may feel severe pain. Older adults and adults with serious medical problems are more likely to have complications than adults who are young and healthy. Some complications can be prevented by answering all of your caregiver's questions thoroughly and by following all pre-procedure instructions. It is important to tell your caregiver if any of the pre-procedure instructions, especially those related to diet, were not followed. Any food or liquid in the stomach can cause problems when you are under general anesthesia. BEFORE THE PROCEDURE  Ask your  caregiver if you will have to spend the night at the hospital. If you will not have to spend the night, arrange to have an adult drive you and stay with you for 24 hours.  Follow your caregiver's instructions if you are taking dietary supplements or medicines. Your caregiver may tell you to stop taking them or to reduce your dosage.  Do not smoke for as long as possible before your procedure. If possible, stop smoking 3-6 weeks before the procedure.  Do not take new dietary supplements or medicines within 1 week of your procedure unless your caregiver approves them.  Do not eat within 8 hours of your procedure or as directed by your caregiver. Drink only clear liquids, such as  water, black coffee (without milk or cream), and fruit juices (without pulp).  Do not drink within 3 hours of your procedure or as directed by your caregiver.  You may brush your teeth on the morning of the procedure, but make sure to spit out the toothpaste and water when finished. PROCEDURE  You will receive anesthetics through a mask, through an intravenous (IV) access tube, or through both. A doctor who specializes in anesthesia (anesthesiologist) or a nurse who specializes in anesthesia (nurse anesthetist) or both will stay with you throughout the procedure to make sure you remain unconscious. He or she will also watch your blood pressure, pulse, and oxygen levels to make sure that the anesthetics do not cause any problems. Once you are asleep, a breathing tube or mask may be used to help you breathe. AFTER THE PROCEDURE You will wake up after the procedure is complete. You may be in the room where the procedure was performed or in a recovery area. You may have a sore throat if a breathing tube was used. You may also feel:  Dizzy.  Weak.  Drowsy.  Confused.  Nauseous.  Cold. These are all normal responses and can be expected to last for up to 24 hours after the procedure is complete. A caregiver will tell you when you are ready to go home. This will usually be when you are fully awake and in stable condition.   This information is not intended to replace advice given to you by your health care provider. Make sure you discuss any questions you have with your health care provider.   Document Released: 12/29/2007 Document Revised: 10/12/2014 Document Reviewed: 01/20/2012 Elsevier Interactive Patient Education 2016 [employer]vier Inc[employer] PATIENT INSTRUCTIONS POST-ANESTHESIA  IMMEDIATELY FOLLOWING SURGERY:  Do not drive or operate machinery for the first twenty four hours after surgery.  Do not make any important decisions for twenty four hours after surgery or while taking narcotic pain  medications or sedatives.  If you develop intractable nausea and vomiting or a severe headache please notify your doctor immediately.  FOLLOW-UP:  Please make an appointment with your surgeon as instructed. You do not need to follow up with anesthesia unless specifically instructed to do so.  WOUND CARE INSTRUCTIONS (if applicable):  Keep a dry clean dressing on the anesthesia/puncture wound site if there is drainage.  Once the wound has quit draining you may leave it open to air.  Generally you should leave the bandage intact for twenty four hours unless there is drainage.  If the epidural site drains for more than 36-48 hours please call the anesthesia department.  QUESTIONS?:  Please feel free to call your physician or the hospital operator if you have any questions, and they will be happy to assist you.

## 2016-04-21 ENCOUNTER — Encounter (HOSPITAL_COMMUNITY)
Admission: RE | Admit: 2016-04-21 | Discharge: 2016-04-21 | Disposition: A | Discharge status: Home / Self Care | Source: Ambulatory Visit | Attending: Surgery | Admitting: Surgery

## 2016-04-21 ENCOUNTER — Encounter (HOSPITAL_COMMUNITY): Payer: Self-pay

## 2016-04-21 NOTE — Telephone Encounter (Signed)
Reminder in epic °

## 2016-04-21 NOTE — Telephone Encounter (Signed)
Pt is aware.  

## 2016-04-21 NOTE — Telephone Encounter (Signed)
PLEASE CALL PT. Please call pt. She had HYPERPLASTIC POLYPS removed. DRINK WATER TO KEEP YOUR URINE LIGHT YELLOW. FOLLOW A HIGH FIBER DIET. NEXT TCS in 5 years.

## 2016-04-24 ENCOUNTER — Encounter (HOSPITAL_COMMUNITY): Payer: Self-pay | Admitting: *Deleted

## 2016-04-24 ENCOUNTER — Encounter (HOSPITAL_COMMUNITY)
Admission: RE | Disposition: A | Discharge status: Home / Self Care | Payer: Self-pay | Source: Ambulatory Visit | Attending: Surgery

## 2016-04-24 ENCOUNTER — Ambulatory Visit (HOSPITAL_COMMUNITY)
Admission: RE | Admit: 2016-04-24 | Discharge: 2016-04-24 | Disposition: A | Discharge status: Home / Self Care | Source: Ambulatory Visit | Attending: Surgery | Admitting: Surgery

## 2016-04-24 ENCOUNTER — Ambulatory Visit (HOSPITAL_COMMUNITY): Admitting: Anesthesiology

## 2016-04-24 DIAGNOSIS — F329 Major depressive disorder, single episode, unspecified: Secondary | ICD-10-CM | POA: Diagnosis not present

## 2016-04-24 DIAGNOSIS — K760 Fatty (change of) liver, not elsewhere classified: Secondary | ICD-10-CM | POA: Insufficient documentation

## 2016-04-24 DIAGNOSIS — Z9889 Other specified postprocedural states: Secondary | ICD-10-CM | POA: Insufficient documentation

## 2016-04-24 DIAGNOSIS — Z79899 Other long term (current) drug therapy: Secondary | ICD-10-CM | POA: Insufficient documentation

## 2016-04-24 DIAGNOSIS — K644 Residual hemorrhoidal skin tags: Secondary | ICD-10-CM | POA: Insufficient documentation

## 2016-04-24 DIAGNOSIS — Z87891 Personal history of nicotine dependence: Secondary | ICD-10-CM | POA: Insufficient documentation

## 2016-04-24 DIAGNOSIS — F411 Generalized anxiety disorder: Secondary | ICD-10-CM | POA: Insufficient documentation

## 2016-04-24 DIAGNOSIS — Z9049 Acquired absence of other specified parts of digestive tract: Secondary | ICD-10-CM | POA: Insufficient documentation

## 2016-04-24 DIAGNOSIS — H409 Unspecified glaucoma: Secondary | ICD-10-CM | POA: Diagnosis not present

## 2016-04-24 DIAGNOSIS — H548 Legal blindness, as defined in USA: Secondary | ICD-10-CM | POA: Insufficient documentation

## 2016-04-24 DIAGNOSIS — Z8619 Personal history of other infectious and parasitic diseases: Secondary | ICD-10-CM | POA: Insufficient documentation

## 2016-04-24 DIAGNOSIS — K642 Third degree hemorrhoids: Secondary | ICD-10-CM | POA: Insufficient documentation

## 2016-04-24 DIAGNOSIS — K589 Irritable bowel syndrome without diarrhea: Secondary | ICD-10-CM | POA: Diagnosis not present

## 2016-04-24 DIAGNOSIS — Z90710 Acquired absence of both cervix and uterus: Secondary | ICD-10-CM | POA: Diagnosis not present

## 2016-04-24 DIAGNOSIS — K219 Gastro-esophageal reflux disease without esophagitis: Secondary | ICD-10-CM | POA: Insufficient documentation

## 2016-04-24 DIAGNOSIS — I10 Essential (primary) hypertension: Secondary | ICD-10-CM | POA: Insufficient documentation

## 2016-04-24 DIAGNOSIS — D573 Sickle-cell trait: Secondary | ICD-10-CM | POA: Diagnosis not present

## 2016-04-24 DIAGNOSIS — E785 Hyperlipidemia, unspecified: Secondary | ICD-10-CM | POA: Diagnosis not present

## 2016-04-24 DIAGNOSIS — M1990 Unspecified osteoarthritis, unspecified site: Secondary | ICD-10-CM | POA: Insufficient documentation

## 2016-04-24 DIAGNOSIS — Z87442 Personal history of urinary calculi: Secondary | ICD-10-CM | POA: Diagnosis not present

## 2016-04-24 DIAGNOSIS — G709 Myoneural disorder, unspecified: Secondary | ICD-10-CM | POA: Insufficient documentation

## 2016-04-24 DIAGNOSIS — J45909 Unspecified asthma, uncomplicated: Secondary | ICD-10-CM | POA: Insufficient documentation

## 2016-04-24 HISTORY — PX: HEMORRHOID SURGERY: SHX153

## 2016-04-24 SURGERY — HEMORRHOIDECTOMY
Anesthesia: General | Site: Rectum

## 2016-04-24 MED ORDER — ONDANSETRON HCL 4 MG/2ML IJ SOLN
4.0000 mg | Freq: Once | INTRAMUSCULAR | Status: AC | PRN
  Administered 2016-04-24: 4 mg via INTRAVENOUS
  Filled 2016-04-24: qty 2

## 2016-04-24 MED ORDER — FENTANYL CITRATE (PF) 100 MCG/2ML IJ SOLN
25.0000 ug | INTRAMUSCULAR | Status: DC | PRN
  Administered 2016-04-24: 25 ug via INTRAVENOUS
  Filled 2016-04-24: qty 2

## 2016-04-24 MED ORDER — MIDAZOLAM HCL 2 MG/2ML IJ SOLN
INTRAMUSCULAR | Status: AC
  Filled 2016-04-24: qty 2

## 2016-04-24 MED ORDER — LIDOCAINE VISCOUS 2 % MT SOLN
OROMUCOSAL | Status: DC | PRN
  Administered 2016-04-24: 1

## 2016-04-24 MED ORDER — ONDANSETRON HCL 4 MG/2ML IJ SOLN
4.0000 mg | Freq: Once | INTRAMUSCULAR | Status: AC
  Administered 2016-04-24: 4 mg via INTRAVENOUS
  Filled 2016-04-24: qty 2

## 2016-04-24 MED ORDER — MIDAZOLAM HCL 5 MG/5ML IJ SOLN
INTRAMUSCULAR | Status: DC | PRN
  Administered 2016-04-24 (×2): 2 mg via INTRAVENOUS

## 2016-04-24 MED ORDER — CEFAZOLIN SODIUM-DEXTROSE 2-4 GM/100ML-% IV SOLN
2.0000 g | INTRAVENOUS | Status: AC
  Administered 2016-04-24: 2 g via INTRAVENOUS

## 2016-04-24 MED ORDER — CHLORHEXIDINE GLUCONATE CLOTH 2 % EX PADS: 6.0000 | MEDICATED_PAD | Freq: Once | CUTANEOUS | Status: DC

## 2016-04-24 MED ORDER — PROPOFOL 10 MG/ML IV BOLUS
INTRAVENOUS | Status: AC
  Filled 2016-04-24: qty 20

## 2016-04-24 MED ORDER — PROMETHAZINE HCL 25 MG/ML IJ SOLN
6.2500 mg | INTRAMUSCULAR | Status: DC | PRN
  Administered 2016-04-24: 6.25 mg via INTRAVENOUS

## 2016-04-24 MED ORDER — PROMETHAZINE HCL 25 MG/ML IJ SOLN
INTRAMUSCULAR | Status: AC
  Filled 2016-04-24: qty 1

## 2016-04-24 MED ORDER — FLEET ENEMA 7-19 GM/118ML RE ENEM
1.0000 | ENEMA | Freq: Once | RECTAL | Status: DC
  Filled 2016-04-24: qty 1

## 2016-04-24 MED ORDER — LIDOCAINE HCL 1 % IJ SOLN
INTRAMUSCULAR | Status: DC | PRN
  Administered 2016-04-24: 25 mg via INTRADERMAL

## 2016-04-24 MED ORDER — MIDAZOLAM HCL 2 MG/2ML IJ SOLN
1.0000 mg | INTRAMUSCULAR | Status: DC | PRN
  Administered 2016-04-24: 2 mg via INTRAVENOUS
  Filled 2016-04-24: qty 2

## 2016-04-24 MED ORDER — HYDROMORPHONE HCL 1 MG/ML IJ SOLN
INTRAMUSCULAR | Status: AC
  Filled 2016-04-24: qty 1

## 2016-04-24 MED ORDER — LACTATED RINGERS IV SOLN
INTRAVENOUS | Status: DC
  Administered 2016-04-24: 1000 mL via INTRAVENOUS

## 2016-04-24 MED ORDER — FENTANYL CITRATE (PF) 100 MCG/2ML IJ SOLN
INTRAMUSCULAR | Status: AC
  Filled 2016-04-24: qty 2

## 2016-04-24 MED ORDER — OXYCODONE-ACETAMINOPHEN 5-325 MG PO TABS: 1.0000 | ORAL_TABLET | ORAL | Status: DC | PRN

## 2016-04-24 MED ORDER — 0.9 % SODIUM CHLORIDE (POUR BTL) OPTIME
TOPICAL | Status: DC | PRN
  Administered 2016-04-24: 1000 mL

## 2016-04-24 MED ORDER — SODIUM CHLORIDE 0.9% FLUSH
INTRAVENOUS | Status: AC
  Filled 2016-04-24: qty 10

## 2016-04-24 MED ORDER — PROPOFOL 10 MG/ML IV BOLUS
INTRAVENOUS | Status: DC | PRN
  Administered 2016-04-24: 50 mg via INTRAVENOUS
  Administered 2016-04-24: 150 mg via INTRAVENOUS
  Administered 2016-04-24: 20 mg via INTRAVENOUS

## 2016-04-24 MED ORDER — BUPIVACAINE HCL (PF) 0.5 % IJ SOLN
INTRAMUSCULAR | Status: AC
  Filled 2016-04-24: qty 30

## 2016-04-24 MED ORDER — CEFAZOLIN SODIUM-DEXTROSE 2-4 GM/100ML-% IV SOLN
INTRAVENOUS | Status: AC
  Filled 2016-04-24: qty 100

## 2016-04-24 MED ORDER — SURGILUBE EX GEL
CUTANEOUS | Status: DC | PRN
  Administered 2016-04-24: 1 via TOPICAL

## 2016-04-24 MED ORDER — DEXAMETHASONE SODIUM PHOSPHATE 4 MG/ML IJ SOLN
4.0000 mg | INTRAMUSCULAR | Status: AC
  Administered 2016-04-24: 4 mg via INTRAVENOUS
  Filled 2016-04-24: qty 1

## 2016-04-24 MED ORDER — HYDROMORPHONE HCL 1 MG/ML IJ SOLN
0.2500 mg | INTRAMUSCULAR | Status: DC | PRN
  Administered 2016-04-24 (×6): 0.5 mg via INTRAVENOUS
  Filled 2016-04-24: qty 1

## 2016-04-24 MED ORDER — LIDOCAINE HCL (PF) 1 % IJ SOLN
INTRAMUSCULAR | Status: AC
  Filled 2016-04-24: qty 5

## 2016-04-24 MED ORDER — FENTANYL CITRATE (PF) 100 MCG/2ML IJ SOLN
25.0000 ug | INTRAMUSCULAR | Status: DC | PRN
  Administered 2016-04-24 (×2): 50 ug via INTRAVENOUS
  Filled 2016-04-24: qty 2

## 2016-04-24 MED ORDER — FENTANYL CITRATE (PF) 100 MCG/2ML IJ SOLN
INTRAMUSCULAR | Status: DC | PRN
  Administered 2016-04-24 (×4): 50 ug via INTRAVENOUS

## 2016-04-24 MED ORDER — LIDOCAINE HCL (PF) 1 % IJ SOLN
INTRAMUSCULAR | Status: AC
  Filled 2016-04-24: qty 30

## 2016-04-24 MED ORDER — LIDOCAINE HCL 1 % IJ SOLN
INTRAMUSCULAR | Status: DC | PRN
  Administered 2016-04-24: 5 mL via INTRAMUSCULAR

## 2016-04-24 MED ORDER — LIDOCAINE VISCOUS 2 % MT SOLN
OROMUCOSAL | Status: AC
  Filled 2016-04-24: qty 15

## 2016-04-24 SURGICAL SUPPLY — 26 items
BAG HAMPER (MISCELLANEOUS) ×3 IMPLANT
CLOTH BEACON ORANGE TIMEOUT ST (SAFETY) ×3 IMPLANT
COVER LIGHT HANDLE STERIS (MISCELLANEOUS) ×6 IMPLANT
DECANTER SPIKE VIAL GLASS SM (MISCELLANEOUS) ×6 IMPLANT
DRAPE PROXIMA HALF (DRAPES) ×3 IMPLANT
ELECT REM PT RETURN 9FT ADLT (ELECTROSURGICAL) ×3
ELECTRODE REM PT RTRN 9FT ADLT (ELECTROSURGICAL) ×1 IMPLANT
FORMALIN 10 PREFIL 120ML (MISCELLANEOUS) ×3 IMPLANT
GAUZE SPONGE 4X4 12PLY STRL (GAUZE/BANDAGES/DRESSINGS) ×3 IMPLANT
GLOVE BIOGEL PI IND STRL 7.0 (GLOVE) ×2 IMPLANT
GLOVE BIOGEL PI IND STRL 7.5 (GLOVE) ×1 IMPLANT
GLOVE BIOGEL PI INDICATOR 7.0 (GLOVE) ×4
GLOVE BIOGEL PI INDICATOR 7.5 (GLOVE) ×2
GLOVE SURG SS PI 7.0 STRL IVOR (GLOVE) ×6 IMPLANT
GOWN STRL REUS W/TWL LRG LVL3 (GOWN DISPOSABLE) ×6 IMPLANT
HEMOSTAT SURGICEL 4X8 (HEMOSTASIS) ×3 IMPLANT
KIT ROOM TURNOVER APOR (KITS) ×3 IMPLANT
LIGASURE IMPACT 36 18CM CVD LR (INSTRUMENTS) ×3 IMPLANT
MANIFOLD NEPTUNE II (INSTRUMENTS) ×3 IMPLANT
NEEDLE HYPO 25X1 1.5 SAFETY (NEEDLE) ×3 IMPLANT
NS IRRIG 1000ML POUR BTL (IV SOLUTION) ×3 IMPLANT
PACK PERI GYN (CUSTOM PROCEDURE TRAY) ×3 IMPLANT
PAD ARMBOARD 7.5X6 YLW CONV (MISCELLANEOUS) ×3 IMPLANT
SET BASIN LINEN APH (SET/KITS/TRAYS/PACK) ×3 IMPLANT
SUT SILK 0 FSL (SUTURE) ×3 IMPLANT
SYR CONTROL 10ML LL (SYRINGE) ×3 IMPLANT

## 2016-04-24 NOTE — Progress Notes (Signed)
3 electrodes to chest removed per pt.

## 2016-04-24 NOTE — Op Note (Signed)
SURGICAL OPERATIVE REPORT   DATE OF PROCEDURE: 04/24/2016  ATTENDING Surgeon(s): Vickie Epley, MD  ANESTHESIA: GETA  PRE-OPERATIVE DIAGNOSIS: Symptomatic 3rd degree internal and external hemorrhoids with symptoms not well-controlled despite medical management  POST-OPERATIVE DIAGNOSIS: Symptomatic 3rd degree internal and external hemorrhoids with symptoms not well-controlled despite medical management  PROCEDURE(S):  1.) Anoscopy 2.) Extensive hemorrhectomy (Right posterior 3rd degree internal and Left posterior 3rd degree internal hemorrhoids)  INTRAOPERATIVE FINDINGS: 3rd degree Right and Left posterior internal hemorrhoids  INTRAVENOUS FLUIDS: 700 mL crystalloid   ESTIMATED BLOOD LOSS: Minimal (<20 mL)  URINE OUTPUT: No foley   SPECIMENS: Right posterior 3rd degree internal hemorrhoid and Left posterior 3rd degree internal hemorrhoid  IMPLANTS: None  DRAINS: None   COMPLICATIONS: None apparent   CONDITION AT END OF PROCEDURE: Hemodynamically stable and extubated   DISPOSITION OF PATIENT: PACU  INDICATIONS FOR PROCEDURE:  Patient is a 65 y.o. female who presented with symptomatic 3rd degree internal hemorrhoids not well-controlled despite medical management. Patient underwent recent colonoscopy, which revealed severe internal hemorrhoids. All risks, benefits, and alternatives to above procedure were discussed with the patient, all of patient's questions were answered to her expressed satisfaction, and informed consent was obtained and documented.  DETAILS OF PROCEDURE: Patient was brought to the operative suite and appropriately identified. General anesthesia was administered along with appropriate pre-operative antibiotics, and endotracheal intubation was performed by anesthetist. In high lithotomy position, operative site was prepped and draped in the usual sterile fashion, and following a brief time out, rigid anoscopy was performed with findings of large and  engorged Right posterior 3rd degree internal hemorrhoid and Left posterior 3rd degree internal hemorrhoid. Local anesthetic was injected, and three hemorrhoidal pedicles were identified. An Alice clamp was placed near the base of first the Right posterior hemorrhoidal pedicle and retracted externally to exteriorize the hemorrhoidal pedicle. Retracted hemorrhoid was carefully dissected from underlying/adherent tissues, and Ligasure was advanced across the base of the hemorrhoidal pedicle and fired, and the above was repeated for large and engorged Left posterior 3rd degree internal hemorrhoid. Hemostasis was confirmed, additional local anesthetic was injected, and lidocaine gel-soaked Surgicel wrapped around 4x4" gauze was inserted into the anal canal for additional hemostasis and pain control  Patient was then safely able to be extubated, awakened, and transferred to PACU for post-operative monitoring and care.  I was present for all aspects of the above procedure, and there were no complications apparent.

## 2016-04-24 NOTE — Discharge Instructions (Addendum)
In addition to included general post-operative instructions for Hemorrhoidectomy,  Diet: Resume home heart healthy high fiber diet and continue to drink water and ensure you are well-hydrated for easier bowel movements, minimize constipation.  Activity: No heavy lifting (children, pets, laundry) or strenuous activity until follow-up, but light activity and walking are encouraged. Do not drive or drink alcohol if taking narcotic pain medications.   Wound care: May shower. Shallow hot saltwater baths may help provide pain relief. You may notice a small amount of dark blood with your first few bowel movements.  Medications: Resume all home medications. For mild to moderate pain: acetaminophen (Tylenol) or ibuprofen (if no kidney disease). Narcotic pain medications, if prescribed, can be used for severe pain, though may cause nausea, constipation, and drowsiness. Do not combine Tylenol and Percocet within a 6 hour period as Percocet contains Tylenol.  Call office [phone]) at any time if any questions, worsening pain, fevers/chills, bleeding, drainage from incision site, or other concerns.  PATIENT INSTRUCTIONS POST-ANESTHESIA  IMMEDIATELY FOLLOWING SURGERY:  Do not drive or operate machinery for the first twenty four hours after surgery.  Do not make any important decisions for twenty four hours after surgery or while taking narcotic pain medications or sedatives.  If you develop intractable nausea and vomiting or a severe headache please notify your doctor immediately.  FOLLOW-UP:  Please make an appointment with your surgeon as instructed. You do not need to follow up with anesthesia unless specifically instructed to do so.  WOUND CARE INSTRUCTIONS (if applicable):  Keep a dry clean dressing on the anesthesia/puncture wound site if there is drainage.  Once the wound has quit draining you may leave it open to air.  Generally you should leave the bandage intact for twenty four hours unless there  is drainage.  If the epidural site drains for more than 36-48 hours please call the anesthesia department.  QUESTIONS?:  Please feel free to call your physician or the hospital operator if you have any questions, and they will be happy to assist you.

## 2016-04-24 NOTE — Progress Notes (Signed)
Rectal packing d/c'd.

## 2016-04-24 NOTE — Interval H&P Note (Signed)
History and Physical Interval Note:  04/24/2016 7:32 AM  [name]  has presented today for surgery, with the diagnosis of internal and external hemorrhoids  The various methods of treatment have been discussed with the patient and family. After consideration of risks, benefits and other options for treatment, the patient has consented to  Procedure(s): EXTENSIVE HEMORRHOIDECTOMY (N/A) as a surgical intervention .  The patient's history has been reviewed, patient examined, no change in status, stable for surgery.  I have reviewed the patient's chart and labs.  Questions were answered to the patient's satisfaction.     Vickie Epley

## 2016-04-24 NOTE — H&P (Deleted)
NTS SOAP Note  Vital Signs:  Vitals as of: 0/96/0454: Systolic 098: Diastolic 79: Heart Rate 103: Temp 98.67F (Temporal): Height 70f 7in: Weight 213Lbs 0 Ounces: BMI 33.36   BMI : 33.36 kg/m2  Subjective: 65year old female presents forincreasingly painful severe internal and external hemorrhoids she reports she's had for >30 years before the pain began to become intolerable more recently. She reports that in 2015 she underwent banding of her hemorrhoids, but feels that the pain has continued to worsen despite endoscopic hemorrhoidal banding. She describes that she already takes a stool softener and fiber supplement to (successfully) facilitate bowel movements, but despite her efforts and regular, easier bowel movements without straining, her pain as continued to worsen. Patient states that since her husband died, she has been spending much of her time traveling, and her hemorrhoidal pain makes sitting in airplane seats for prolonged flights particularly uncomfortable and painful. Patient is accordingly referred from Gastroenterology for further management and treatment.   Review of Symptoms:  Constitutional:No fevers, chills, or unexplained weight loss Head:Atraumatic; no masses; no abnormalities Eyes:No visual changes or eye pain Nose/Mouth/Throat:No nasal congestion, rhinorrhea, oral lesions, postnasal drip or sore throat  Cardiovascular: No chest pain or palpitations.  Respiratory:No cough, shortness of breath or wheezing  GastrointestinFrequent blood in stools with hemorrhoids as only explanation on recent colonoscopy; No diarrhea, constipation, abdominal pain, vomiting or heartburn Genitourinary:No urinary frequency, hematuria, incontinence, or dysuria Musculoskeletal:No arthalgias, myalgias or joint swelling Skin:No rash or bothersome skin lesions   Past Medical History:Reviewed  Past Medical History  Surgical History:  cholecystectomy, appendectomy, hysterectomy,  cataract extraction, laparoscopic lysis of adhesions, colonoscopy with tubular adenoma polypectomy, EGD Medical Problems:  irritable bowel syndrome, GERD, HTN, hyperlipidemia, renal cysts, nephrolithiasis, fatty liver, sickle cell trait, G6PD deficiency, asthma, legally blind, glaucoma Psychiatric History:  generalized anxiety, depression   Social History:Reviewed  Social History  Preferred Language: English Race:  White Ethnicity: Not Hispanic / Latino Age: 65year Marital Status:  S   Smoking Status: Never smoker reviewed on 04/15/2016 Functional Status reviewed on mm/dd/yyyy ------------------------------------------------ Bathing: Normal Cooking: Normal Dressing: Normal Driving: Normal Eating: Normal Managing Meds: Normal Oral Care: Normal Shopping: Normal Toileting: Normal Transferring: Normal Walking: Normal Cognitive Status reviewed on mm/dd/yyyy ------------------------------------------------ Attention: Normal Decision Making: Normal Language: Normal Memory: Normal Motor: Normal Perception: Normal Problem Solving: Normal Visual and Spatial: Normal   Family History:Reviewed  Family Health History Mother, Deceased at Age 63 Cancer unspecified;  Father, Deceased at Age 63 Cancer unspecified;     Objective Information: General:Well appearing, well nourished in no distress. Skin:no rash or prominent lesions Head:Atraumatic; no masses; no abnormalities Eyes:conjunctiva clear, EOM intact, PERRL Mouth:Mucous membranes moist, no mucosal lesions. Throat:no erythema, exudates or lesions. Neck:Supple without lymphadenopathy.  Heart:RRR, no murmur Lungs:CTA bilaterally, no wheezes, rhonchi, rales.  Breathing unlabored. Abdomen:Soft, NT/ND, no HSM, no masses.  Extensive tender 2nd degree internal and external hemorrhoids Extremities:No deformities, clubbing, cyanosis, or edema.   Assessment: 65year old Female with increasingly  painful 2nd degree internal and external hemorrhoids despite non-operative management and endoscopic hemorrhoidal banding. Diagnosis & Procedure Smart Code   Plan:     - Continue current management     - All risks, benefits, and alternatives to hemorrhoidectomy discussed with the patient, all of her questions answered to her expressed satisfaction, and informed consent was obtained     - Will plan for elective extensive hemorrhoidectomy next week

## 2016-04-24 NOTE — Anesthesia Preprocedure Evaluation (Signed)
Anesthesia Evaluation  Patient identified by MRN, date of birth, ID band Patient awake    Reviewed: Allergy & Precautions, NPO status , Patient's Chart, lab work & pertinent test results  History of Anesthesia Complications (+) PONV and history of anesthetic complications  Airway Mallampati: III  TM Distance: >3 FB Neck ROM: Full    Dental  (+) Teeth Intact   Pulmonary asthma , former smoker,    breath sounds clear to auscultation       Cardiovascular hypertension, Pt. on medications  Rhythm:Regular Rate:Normal     Neuro/Psych PSYCHIATRIC DISORDERS Anxiety Depression  Neuromuscular disease    GI/Hepatic GERD  Controlled and Medicated,(+) Hepatitis - ([facility])  Endo/Other    Renal/GU CRFRenal disease  negative genitourinary   Musculoskeletal  (+) Arthritis ,   Abdominal   Peds negative pediatric ROS (+)  Hematology negative hematology ROS (+) Sickle cell trait ,   Anesthesia Other Findings - G6PD Def.    Reproductive/Obstetrics negative OB ROS                             Anesthesia Physical Anesthesia Plan  ASA: III  Anesthesia Plan: General   Post-op Pain Management:    Induction: Intravenous  Airway Management Planned: LMA  Additional Equipment:   Intra-op Plan:   Post-operative Plan: Extubation in OR  Informed Consent: I have reviewed the patients History and Physical, chart, labs and discussed the procedure including the risks, benefits and alternatives for the proposed anesthesia with the patient or authorized representative who has indicated his/her understanding and acceptance.     Plan Discussed with: CRNA  Anesthesia Plan Comments:         Anesthesia Quick Evaluation

## 2016-04-24 NOTE — Anesthesia Procedure Notes (Signed)
Procedure Name: LMA Insertion Date/Time: 04/24/2016 9:29 AM Performed by: Charmaine Downs Pre-anesthesia Checklist: Patient identified, Emergency Drugs available, Suction available and Patient being monitored Patient Re-evaluated:Patient Re-evaluated prior to inductionOxygen Delivery Method: Circle system utilized Preoxygenation: Pre-oxygenation with 100% oxygen Intubation Type: IV induction Ventilation: Mask ventilation without difficulty LMA: LMA inserted LMA Size: 4.0 Grade View: Grade III Number of attempts: 1 Placement Confirmation: positive ETCO2 and breath sounds checked- equal and bilateral Tube secured with: Tape Dental Injury: Teeth and Oropharynx as per pre-operative assessment

## 2016-04-24 NOTE — Progress Notes (Signed)
Notes Recorded by Alycia Rossetti, MD on 04/20/2016 at 4:51 PM Call pt give previous results, she needs to go have A1C done, dx- elevated fasting blood glucose Notes Recorded by Alycia Rossetti, MD on 04/20/2016 at 3:09 PM ADD A1C   Her kidney function was normal  Call patient her liver function tests have gone up some as well as her triglycerides they're now in the 400s which is 60 points higher than previous. Toprol she is taking her TriCor she needs to be on very strict low-fat diet to get her triglycerides back down they have been continuously going up over the past year she has fatty liver disease which is why her liver function tests are elevated

## 2016-04-24 NOTE — Anesthesia Postprocedure Evaluation (Signed)
Anesthesia Post Note  Patient: [name]  Procedure(s) Performed: Procedure(s) (LRB): EXTENSIVE HEMORRHOIDECTOMY (N/A)  Patient location during evaluation: PACU Anesthesia Type: General Level of consciousness: awake and alert, oriented and patient cooperative Pain management: pain level controlled Vital Signs Assessment: post-procedure vital signs reviewed and stable Respiratory status: spontaneous breathing, nonlabored ventilation and respiratory function stable Cardiovascular status: blood pressure returned to baseline and stable Postop Assessment: no signs of nausea or vomiting Anesthetic complications: no    Last Vitals:  Filed Vitals:   04/24/16 0915 04/24/16 1008  BP:  125/80  Pulse:  90  Temp:  36.6 C  Resp: 21 14    Last Pain:  Filed Vitals:   04/24/16 1013  PainSc: 6                  Yazeed Pryer J

## 2016-04-24 NOTE — Transfer of Care (Signed)
Immediate Anesthesia Transfer of Care Note  Patient: [name]  Procedure(s) Performed: Procedure(s): EXTENSIVE HEMORRHOIDECTOMY (N/A)  Patient Location: PACU  Anesthesia Type:General  Level of Consciousness: awake, alert , oriented and patient cooperative  Airway & Oxygen Therapy: Patient Spontanous Breathing and Patient connected to face mask oxygen  Post-op Assessment: Report given to RN, Post -op Vital signs reviewed and stable and Patient moving all extremities  Post vital signs: Reviewed and stable  Last Vitals:  Filed Vitals:   04/24/16 0910 04/24/16 0915  BP:    Temp:    Resp: 17 21    Last Pain:  Filed Vitals:   04/24/16 0916  PainSc: 2       Patients Stated Pain Goal: 6 (95/36/92 2300)  Complications: No apparent anesthesia complications

## 2016-04-29 ENCOUNTER — Encounter (HOSPITAL_COMMUNITY): Payer: Self-pay | Admitting: Surgery

## 2016-05-20 ENCOUNTER — Encounter: Payer: Self-pay | Admitting: Family Medicine

## 2016-05-20 ENCOUNTER — Ambulatory Visit (INDEPENDENT_AMBULATORY_CARE_PROVIDER_SITE_OTHER): Admitting: Family Medicine

## 2016-05-20 VITALS — BP 112/74 | HR 76 | Temp 99.3°F | Resp 18 | Wt 204.0 lb

## 2016-05-20 DIAGNOSIS — Z8719 Personal history of other diseases of the digestive system: Secondary | ICD-10-CM

## 2016-05-20 DIAGNOSIS — K6289 Other specified diseases of anus and rectum: Secondary | ICD-10-CM | POA: Diagnosis not present

## 2016-05-20 DIAGNOSIS — R300 Dysuria: Secondary | ICD-10-CM | POA: Diagnosis not present

## 2016-05-20 DIAGNOSIS — Z9889 Other specified postprocedural states: Secondary | ICD-10-CM

## 2016-05-20 MED ORDER — OXYCODONE-ACETAMINOPHEN 5-325 MG PO TABS: 1.0000 | ORAL_TABLET | ORAL | 0 refills | Status: DC | PRN

## 2016-05-20 MED ORDER — PROMETHAZINE HCL 25 MG/ML IJ SOLN
12.5000 mg | Freq: Once | INTRAMUSCULAR | Status: AC
  Administered 2016-05-20: 12.5 mg via INTRAMUSCULAR

## 2016-05-20 MED ORDER — METRONIDAZOLE 500 MG PO TABS: 500.0000 mg | ORAL_TABLET | Freq: Two times a day (BID) | ORAL | 0 refills | Status: DC

## 2016-05-20 MED ORDER — CIPROFLOXACIN HCL 500 MG PO TABS: 500.0000 mg | ORAL_TABLET | Freq: Two times a day (BID) | ORAL | 0 refills | Status: DC

## 2016-05-20 MED ORDER — ONDANSETRON HCL 4 MG PO TABS: 4.0000 mg | ORAL_TABLET | Freq: Three times a day (TID) | ORAL | 1 refills | Status: DC | PRN

## 2016-05-20 NOTE — Progress Notes (Signed)
Subjective:    Patient ID: [name], female    DOB: 10-29-1950, 65 y.o.   MRN: [id number]  Patient presents for Follow-up (Had Hemorrhoidectomy and having problems since surgery)  Patient here with problems since her hemorrhoidectomy. 10 hemorrhoidectomy performed on 721 since then she has had increased rectal pain and lower abdominal pain just had pus draining for the past 2 weeks. She states she voices concerns are her surgeon but he is not listening. She has called multiple times stating that she is having drainage and pain was told that it would just improve. She is now almost 4 weeks from her surgery. She has not had any fever or chills but has had nausea she's been unable to eat because of the pain she feels pain now all over her body she is very anxious. Her bowels are moving despite feeling bloated. She is taking MiraLAX and Senokot. She no longer has any nausea medication she is also out of the pain medication prescribed. She was told to use topical lidocaine but this Caryl Pina caused severe burning. She has to wear a pad because of the pus that is coming out.   Review Of Systems:  GEN- denies fatigue, fever, weight loss,weakness, recent illness HEENT- denies eye drainage, change in vision, nasal discharge, CVS- denies chest pain, palpitations RESP- denies SOB, cough, wheeze ABD- +N/V, change in stools,+ abd pain GU- + dysuria deines, hematuria, dribbling, incontinence MSK- denies joint pain, muscle aches, injury Neuro- denies headache, dizziness, syncope, seizure activity       Objective:    BP 112/74   Pulse 76   Temp 99.3 F (37.4 C) (Oral)   Resp 18   Wt 204 lb (92.5 kg)   BMI 31.95 kg/m  GEN- NAD, alert and oriented x3,standing up, very uncomfortable appearing HEENT- PERRL, EOMI, non injected sclera, pink conjunctiva, MMM, oropharynx clear CVS- RRR, no murmur RESP-CTAB ABD-NABS,soft,mild TTP in suprapubic region no CVA tenderness Skin- rectum- swelling around  anus, 2 qauerter size ulcerated like areas with granulation tissue, yellow pus draining from both lesion and at anus, TTP, mild odor  EXT- No edema Pulses- Radial, DP- 2+        Assessment & Plan:      Problem List Items Addressed This Visit    None    Visit Diagnoses    Dysuria    -  Primary   The same urine for culture and she will already be on antibiotics to treat any infection   Relevant Orders   CBC with Differential/Platelet   Comprehensive metabolic panel   Urinalysis, Routine w reflex microscopic (not at [facility])   Urine culture   Rectal pain       I think her uncontrolled rectal pain from an infection is causing a lot of her somatic complaints. I think this is causing her to have increased anxiety. I will start her on Cipro and Flagyl because of the location is unclear if this drainage is coming from the immediate surgical site or if she may have an abscess forming. She actually does follow with her surgeon tomorrow and I will discuss my visit today with her to help with communication. I have refilled Zofran and also given her Percocet have recommended that she stop using any topicals on the area since it has an ulcerated appearance. I will check CBC today   Had Nausea in office, given phenergan   Relevant Orders   Comprehensive metabolic panel   S/P hemorrhoidectomy  Note: This dictation was prepared with Dragon dictation along with smaller phrase technology. Any transcriptional errors that result from this process are unintentional.

## 2016-05-20 NOTE — Patient Instructions (Signed)
Take antibiotics Take the zofran for nausea Pain medication F/u with surgeon tomorrow

## 2016-05-20 NOTE — Addendum Note (Signed)
Addended by: Shary Decamp B on: 05/20/2016 04:09 PM   Modules accepted: Orders

## 2016-05-21 LAB — URINALYSIS, MICROSCOPIC ONLY
CRYSTALS: NONE SEEN [HPF]
Casts: NONE SEEN [LPF]
YEAST: NONE SEEN [HPF]

## 2016-05-21 LAB — URINALYSIS, ROUTINE W REFLEX MICROSCOPIC
BILIRUBIN URINE: NEGATIVE
GLUCOSE, UA: NEGATIVE
Hgb urine dipstick: NEGATIVE
Ketones, ur: NEGATIVE
Nitrite: NEGATIVE
PH: 6 (ref 5.0–8.0)
PROTEIN: NEGATIVE
SPECIFIC GRAVITY, URINE: 1.015 (ref 1.001–1.035)

## 2016-05-21 LAB — CBC WITH DIFFERENTIAL/PLATELET
BASOS ABS: 57 {cells}/uL (ref 0–200)
Basophils Relative: 1 %
Eosinophils Absolute: 285 cells/uL (ref 15–500)
Eosinophils Relative: 5 %
HEMATOCRIT: 40.7 % (ref 35.0–45.0)
HEMOGLOBIN: 13.5 g/dL (ref 12.0–15.0)
LYMPHS ABS: 1881 {cells}/uL (ref 850–3900)
Lymphocytes Relative: 33 %
MCH: 31.8 pg (ref 27.0–33.0)
MCHC: 33.2 g/dL (ref 32.0–36.0)
MCV: 95.8 fL (ref 80.0–100.0)
MONO ABS: 513 {cells}/uL (ref 200–950)
MPV: 10.1 fL (ref 7.5–12.5)
Monocytes Relative: 9 %
NEUTROS ABS: 2964 {cells}/uL (ref 1500–7800)
NEUTROS PCT: 52 %
Platelets: 230 10*3/uL (ref 140–400)
RBC: 4.25 MIL/uL (ref 3.80–5.10)
RDW: 12.6 % (ref 11.0–15.0)
WBC: 5.7 10*3/uL (ref 3.8–10.8)

## 2016-05-21 LAB — COMPREHENSIVE METABOLIC PANEL
ALBUMIN: 4.6 g/dL (ref 3.6–5.1)
ALK PHOS: 36 U/L (ref 33–130)
ALT: 41 U/L — ABNORMAL HIGH (ref 6–29)
AST: 56 U/L — AB (ref 10–35)
BUN: 15 mg/dL (ref 7–25)
CALCIUM: 10.1 mg/dL (ref 8.6–10.4)
CHLORIDE: 102 mmol/L (ref 98–110)
CO2: 23 mmol/L (ref 20–31)
Creat: 1.31 mg/dL — ABNORMAL HIGH (ref 0.50–0.99)
GLUCOSE: 105 mg/dL — AB (ref 70–99)
POTASSIUM: 4.4 mmol/L (ref 3.5–5.3)
Sodium: 139 mmol/L (ref 135–146)
TOTAL PROTEIN: 7.1 g/dL (ref 6.1–8.1)
Total Bilirubin: 0.9 mg/dL (ref 0.2–1.2)

## 2016-05-22 LAB — URINE CULTURE

## 2016-06-01 ENCOUNTER — Telehealth: Payer: Self-pay | Admitting: Family Medicine

## 2016-06-01 ENCOUNTER — Telehealth: Payer: Self-pay | Admitting: Gastroenterology

## 2016-06-01 NOTE — Telephone Encounter (Signed)
Patient underwent hemorrhoidectomy on 04/24/2016.  She is still voicing C/O increased rectal itching.   MD please advise.

## 2016-06-01 NOTE — Telephone Encounter (Signed)
I called pt and she said she has had so much trouble since her hemorrhoidectomy on 04/24/2016 by Dr. Rosana Hoes. She was disappointed that she saw Dr. Rosana Hoes instead of Dr. Arnoldo Morale. She was complaining because she still has problems and a lot of pain. I told her to call the surgeon and she said she has already talked to them. She insist that she needs Dr.Fields to take a look at her and advise. I told her Dr. Oneida Alar is on vacation, I offered an appointment with Neil Crouch, PA on Wed at 10:30 Am and she declined. Said she wants to discuss with Dr. Oneida Alar. She is aware Dr. Oneida Alar will be back on Friday but will be at the hospital, and she doesn't have appts until Oct. She just wants to wait and talk to her.

## 2016-06-01 NOTE — Telephone Encounter (Signed)
[phone] Patient is calling to say that she is still itching from her surgery, and wants to know if this is normal please call and advise

## 2016-06-01 NOTE — Telephone Encounter (Signed)
Pt called to say that she had rectal pain and feels like she needs to be seen by SF. She also said that she had recent surgery and her PCP but her on an antibiotic. I offered her SF next available on Oct. 4th, but she can't wait that long because she has a cruise planned and a trip to [place]. I told her that SF was on vacation this week and I could transfer call to nurse's VM. Pt agreed.

## 2016-06-01 NOTE — Telephone Encounter (Signed)
Call placed to patient and patient made aware.  

## 2016-06-01 NOTE — Telephone Encounter (Signed)
I would recommend some A+D ointment for irritation.  NTBS if no better

## 2016-06-02 NOTE — Telephone Encounter (Signed)
Noted. Patient declined input from anyone but Dr. Oneida Alar.

## 2016-06-05 NOTE — Telephone Encounter (Signed)
Called patient TO DISCUSS CONCERNS. Had surgery JUL 21. SAT AFTER SURGERY SHE THREW UP. HEAVY RECTAL BLEEDING AND PAIN IN LOWER GROIN AND STAYED FOR 5 DAYS. Not doing well. SAW DR. Rosana Hoes LAST TUES AND HE SAID SHE'S DOING FINE. DOESN'T FEEL FINE.  WEAK VOICE SHAKY/NO APPETITE. BLOOD IN STOOL IF SHE HAS SOFT/FORMED. IF SITS ON TOILET AND HAS BM HAS TO HOLD ONTO TOILET AND FEEL LIKE SHE'S BEING RIPPED APART. SYMPTOMS SINCE SURGERY ARE BETTER OUTSIDE BUT INSIDE NOT BETTER. THERE WAS PUS AND CONFIRMED YELLOW PUS. RX CIP/FLAGYL, BUT DIDN'T TAKE THEM. USING SITZ BATHS.  FLEX SIG W/ MAC ON TUES SEP 5, DX: RECTAL BLEEDING/PAIN. PT NEEDS LIDOCAINE JELLY TO RECTUM PRIOR TO FLEETS ENEMA IN PREOP. TRIED  APOTHECARY CREAM SEVERAL TIMES BUT COULDN'T GET TIP UP THERE. RECOMMEND CREAM BID UNTIL TUES.  PT INSTRUCTED TO CALL PREOP [phone] @745AM  TO CONFIRM ARRIVAL TIME. TOLD PT I THINK SHE WILL NEED TO ARRIVE

## 2016-06-05 NOTE — Addendum Note (Signed)
Addended by: Danie Binder on: 06/05/2016 07:41 PM   Modules accepted: Miquel Dunn

## 2016-06-09 ENCOUNTER — Ambulatory Visit (HOSPITAL_COMMUNITY): Admitting: Anesthesiology

## 2016-06-09 ENCOUNTER — Ambulatory Visit (HOSPITAL_COMMUNITY)
Admission: RE | Admit: 2016-06-09 | Discharge: 2016-06-09 | Disposition: A | Discharge status: Home / Self Care | Source: Ambulatory Visit | Attending: Gastroenterology | Admitting: Gastroenterology

## 2016-06-09 ENCOUNTER — Encounter (HOSPITAL_COMMUNITY)
Admission: RE | Disposition: A | Discharge status: Home / Self Care | Payer: Self-pay | Source: Ambulatory Visit | Attending: Gastroenterology

## 2016-06-09 ENCOUNTER — Encounter (HOSPITAL_COMMUNITY): Payer: Self-pay | Admitting: Anesthesiology

## 2016-06-09 DIAGNOSIS — K625 Hemorrhage of anus and rectum: Secondary | ICD-10-CM | POA: Diagnosis present

## 2016-06-09 DIAGNOSIS — K621 Rectal polyp: Secondary | ICD-10-CM

## 2016-06-09 DIAGNOSIS — I10 Essential (primary) hypertension: Secondary | ICD-10-CM | POA: Insufficient documentation

## 2016-06-09 DIAGNOSIS — K6289 Other specified diseases of anus and rectum: Secondary | ICD-10-CM | POA: Diagnosis not present

## 2016-06-09 DIAGNOSIS — J45909 Unspecified asthma, uncomplicated: Secondary | ICD-10-CM | POA: Diagnosis not present

## 2016-06-09 DIAGNOSIS — F419 Anxiety disorder, unspecified: Secondary | ICD-10-CM | POA: Diagnosis not present

## 2016-06-09 DIAGNOSIS — E7800 Pure hypercholesterolemia, unspecified: Secondary | ICD-10-CM | POA: Diagnosis not present

## 2016-06-09 DIAGNOSIS — K921 Melena: Secondary | ICD-10-CM | POA: Diagnosis not present

## 2016-06-09 DIAGNOSIS — G8918 Other acute postprocedural pain: Secondary | ICD-10-CM | POA: Diagnosis not present

## 2016-06-09 DIAGNOSIS — Z79899 Other long term (current) drug therapy: Secondary | ICD-10-CM | POA: Diagnosis not present

## 2016-06-09 DIAGNOSIS — Z87891 Personal history of nicotine dependence: Secondary | ICD-10-CM | POA: Diagnosis not present

## 2016-06-09 DIAGNOSIS — M1990 Unspecified osteoarthritis, unspecified site: Secondary | ICD-10-CM | POA: Insufficient documentation

## 2016-06-09 DIAGNOSIS — K219 Gastro-esophageal reflux disease without esophagitis: Secondary | ICD-10-CM | POA: Diagnosis not present

## 2016-06-09 HISTORY — PX: FLEXIBLE SIGMOIDOSCOPY: SHX5431

## 2016-06-09 SURGERY — SIGMOIDOSCOPY, FLEXIBLE
Anesthesia: Monitor Anesthesia Care

## 2016-06-09 MED ORDER — MIDAZOLAM HCL 5 MG/5ML IJ SOLN
INTRAMUSCULAR | Status: DC | PRN
  Administered 2016-06-09: 2 mg via INTRAVENOUS

## 2016-06-09 MED ORDER — LACTATED RINGERS IV SOLN
INTRAVENOUS | Status: DC | PRN
  Administered 2016-06-09: 08:00:00 via INTRAVENOUS

## 2016-06-09 MED ORDER — OXYCODONE-ACETAMINOPHEN 5-325 MG PO TABS: 1.0000 | ORAL_TABLET | ORAL | 0 refills | Status: DC | PRN

## 2016-06-09 MED ORDER — LACTATED RINGERS IV SOLN
INTRAVENOUS | Status: DC
  Administered 2016-06-09: 1000 mL via INTRAVENOUS

## 2016-06-09 MED ORDER — MIDAZOLAM HCL 2 MG/2ML IJ SOLN
1.0000 mg | INTRAMUSCULAR | Status: DC | PRN
  Administered 2016-06-09 (×2): 2 mg via INTRAVENOUS
  Filled 2016-06-09: qty 2

## 2016-06-09 MED ORDER — FLEET ENEMA 7-19 GM/118ML RE ENEM: 1.0000 | ENEMA | Freq: Once | RECTAL | Status: DC

## 2016-06-09 MED ORDER — ONDANSETRON HCL 4 MG/2ML IJ SOLN
INTRAMUSCULAR | Status: AC
  Filled 2016-06-09: qty 2

## 2016-06-09 MED ORDER — LIDOCAINE HCL 2 % EX GEL
CUTANEOUS | Status: AC
  Filled 2016-06-09: qty 30

## 2016-06-09 MED ORDER — FENTANYL CITRATE (PF) 100 MCG/2ML IJ SOLN
INTRAMUSCULAR | Status: AC
  Filled 2016-06-09: qty 2

## 2016-06-09 MED ORDER — ONDANSETRON HCL 4 MG/2ML IJ SOLN
4.0000 mg | Freq: Once | INTRAMUSCULAR | Status: AC
  Administered 2016-06-09: 4 mg via INTRAVENOUS

## 2016-06-09 MED ORDER — LIDOCAINE HCL 2 % EX GEL
1.0000 "application " | Freq: Once | CUTANEOUS | Status: AC
  Administered 2016-06-09: 1 via TOPICAL

## 2016-06-09 MED ORDER — HYDROMORPHONE HCL 1 MG/ML IJ SOLN
INTRAMUSCULAR | Status: AC
  Filled 2016-06-09: qty 1

## 2016-06-09 MED ORDER — HYDROMORPHONE HCL 1 MG/ML IJ SOLN
0.2500 mg | INTRAMUSCULAR | Status: DC | PRN
  Administered 2016-06-09 (×2): 0.5 mg via INTRAVENOUS

## 2016-06-09 MED ORDER — PROPOFOL 500 MG/50ML IV EMUL
INTRAVENOUS | Status: DC | PRN
  Administered 2016-06-09: 150 ug/kg/min via INTRAVENOUS

## 2016-06-09 MED ORDER — FENTANYL CITRATE (PF) 100 MCG/2ML IJ SOLN
INTRAMUSCULAR | Status: DC | PRN
  Administered 2016-06-09 (×2): 50 ug via INTRAVENOUS

## 2016-06-09 MED ORDER — MIDAZOLAM HCL 2 MG/2ML IJ SOLN
INTRAMUSCULAR | Status: AC
  Filled 2016-06-09: qty 2

## 2016-06-09 MED ORDER — FENTANYL CITRATE (PF) 100 MCG/2ML IJ SOLN
25.0000 ug | INTRAMUSCULAR | Status: AC | PRN
  Administered 2016-06-09 (×2): 25 ug via INTRAVENOUS

## 2016-06-09 NOTE — Discharge Instructions (Signed)
You had 2 polyp removed. You ARE HEALED ON THE INSIDE. YOUR PAIN IS DUE TO DELAYED HEALING OF THE EXTERNAL HEMORRHOIDECTOMY SITES.     RESUME YOUR PREVIOUS DIET.  APPLY DESITIN IF TOLERATED FOUR TIMES A  DAY TO PERI-RECTAL AREA.  PERCOCET #45 1-2 EVERY 4 HOURS IF NEEDED FOR RECTAL PAIN.  [facility] RECTAL CREAM IF NEEDED FOR ADDITIONAL PAIN RELIEF.  YOUR BIOPSY RESULTS WILL BE AVAILABLE IN MY CHART AFTER SEP 8 AND MY OFFICE WILL CONTACT YOU IN 10-14 DAYS WITH YOUR RESULTS.   FOLLOW UP IN 2 MOS.      ENDOSCOPY Care After Read the instructions outlined below and refer to this sheet in the next week. These discharge instructions provide you with general information on caring for yourself after you leave the hospital. While your treatment has been planned according to the most current medical practices available, unavoidable complications occasionally occur. If you have any problems or questions after discharge, call DR. Mazey Mantell, [phone].  ACTIVITY  You may resume your regular activity, but move at a slower pace for the next 24 hours.   Take frequent rest periods for the next 24 hours.   Walking will help get rid of the air and reduce the bloated feeling in your belly (abdomen).   No driving for 24 hours (because of the medicine (anesthesia) used during the test).   You may shower.   Do not sign any important legal documents or operate any machinery for 24 hours (because of the anesthesia used during the test).    NUTRITION  Drink plenty of fluids.   You may resume your normal diet as instructed by your doctor.   Begin with a light meal and progress to your normal diet. Heavy or fried foods are harder to digest and may make you feel sick to your stomach (nauseated).   Avoid alcoholic beverages for 24 hours or as instructed.    MEDICATIONS  You may resume your normal medications.   WHAT YOU CAN EXPECT TODAY  Some feelings of bloating in the abdomen.    Passage of more gas than usual.   Spotting of blood in your stool or on the toilet paper  .  IF YOU HADBIOPSIES TAKEN  DURING THE SIGMOIDOSCOPY/UPPER ENDOSCOPY:  Eat a soft diet IF YOU HAVE NAUSEA, BLOATING, ABDOMINAL PAIN, OR VOMITING.    FINDING OUT THE RESULTS OF YOUR TEST Not all test results are available during your visit. DR. Oneida Alar WILL CALL YOU WITHIN 14 DAYS OF YOUR PROCEDUE WITH YOUR RESULTS. Do not assume everything is normal if you have not heard from DR. Braiden Presutti, CALL HER OFFICE AT [phone].  SEEK IMMEDIATE MEDICAL ATTENTION AND CALL THE OFFICE: [phone] IF:  You have more than a spotting of blood in your stool.   Your belly is swollen (abdominal distention).   You are nauseated or vomiting.   You have a temperature over 101F.   You have abdominal pain or discomfort that is severe or gets worse throughout the day.    High-Fiber Diet A high-fiber diet changes your normal diet to include more whole grains, legumes, fruits, and vegetables. Changes in the diet involve replacing refined carbohydrates with unrefined foods. The calorie level of the diet is essentially unchanged. The Dietary Reference Intake (recommended amount) for adult males is 38 grams per day. For adult females, it is 25 grams per day. Pregnant and lactating women should consume 28 grams of fiber per day. Fiber is the intact part of a  plant that is not broken down during digestion. Functional fiber is fiber that has been isolated from the plant to provide a beneficial effect in the body. PURPOSE  Increase stool bulk.   Ease and regulate bowel movements.   Lower cholesterol.   REDUCE RISK OF COLON CANCER  INDICATIONS THAT YOU NEED MORE FIBER  Constipation and hemorrhoids.   Uncomplicated diverticulosis (intestine condition) and irritable bowel syndrome.   Weight management.   As a protective measure against hardening of the arteries (atherosclerosis), diabetes, and cancer.    GUIDELINES FOR INCREASING FIBER IN THE DIET  Start adding fiber to the diet slowly. A gradual increase of about 5 more grams (2 slices of whole-wheat bread, 2 servings of most fruits or vegetables, or 1 bowl of high-fiber cereal) per day is best. Too rapid an increase in fiber may result in constipation, flatulence, and bloating.   Drink enough water and fluids to keep your urine clear or pale yellow. Water, juice, or caffeine-free drinks are recommended. Not drinking enough fluid may cause constipation.   Eat a variety of high-fiber foods rather than one type of fiber.   Try to increase your intake of fiber through using high-fiber foods rather than fiber pills or supplements that contain small amounts of fiber.   The goal is to change the types of food eaten. Do not supplement your present diet with high-fiber foods, but replace foods in your present diet.   INCLUDE A VARIETY OF FIBER SOURCES  Replace refined and processed grains with whole grains, canned fruits with fresh fruits, and incorporate other fiber sources. White rice, white breads, and most bakery goods contain little or no fiber.   Brown whole-grain rice, buckwheat oats, and many fruits and vegetables are all good sources of fiber. These include: broccoli, Brussels sprouts, cabbage, cauliflower, beets, sweet potatoes, white potatoes (skin on), carrots, tomatoes, eggplant, squash, berries, fresh fruits, and dried fruits.   Cereals appear to be the richest source of fiber. Cereal fiber is found in whole grains and bran. Bran is the fiber-rich outer coat of cereal grain, which is largely removed in refining. In whole-grain cereals, the bran remains. In breakfast cereals, the largest amount of fiber is found in those with "bran" in their names. The fiber content is sometimes indicated on the label.   You may need to include additional fruits and vegetables each day.   In baking, for 1 cup white flour, you may use the following  substitutions:   1 cup whole-wheat flour minus 2 tablespoons.   1/2 cup white flour plus 1/2 cup whole-wheat flour.     Polyps, Colon  A polyp is extra tissue that grows inside your body. Colon polyps grow in the large intestine. The large intestine, also called the colon, is part of your digestive system. It is a long, hollow tube at the end of your digestive tract where your body makes and stores stool. Most polyps are not dangerous. They are benign. This means they are not cancerous. But over time, some types of polyps can turn into cancer. Polyps that are smaller than a pea are usually not harmful. But larger polyps could someday become or may already be cancerous. To be safe, doctors remove all polyps and test them.   PREVENTION There is not one sure way to prevent polyps. You might be able to lower your risk of getting them if you:  Eat more fruits and vegetables and less fatty food.   Do not smoke.  Avoid alcohol.   Exercise every day.   Lose weight if you are overweight.   Eating more calcium and folate can also lower your risk of getting polyps. Some foods that are rich in calcium are milk, cheese, and broccoli. Some foods that are rich in folate are chickpeas, kidney beans, and spinach.

## 2016-06-09 NOTE — Transfer of Care (Signed)
Immediate Anesthesia Transfer of Care Note  Patient: [name]  Procedure(s) Performed: Procedure(s) with comments: FLEXIBLE SIGMOIDOSCOPY (N/A) - rectal polyps times 2  Patient Location: PACU  Anesthesia Type:MAC  Level of Consciousness: awake, alert , oriented and patient cooperative  Airway & Oxygen Therapy: Patient Spontanous Breathing and Patient connected to nasal cannula oxygen  Post-op Assessment: Report given to RN and Post -op Vital signs reviewed and stable  Post vital signs: Reviewed and stable  Last Vitals:  Vitals:   06/09/16 0835 06/09/16 0840  BP: 138/69 117/63  Pulse:    Resp: (!) 22 20  Temp:      Last Pain:  Vitals:   06/09/16 0815  PainSc: 9          Complications: No apparent anesthesia complications

## 2016-06-09 NOTE — Anesthesia Procedure Notes (Signed)
Procedure Name: MAC Date/Time: 06/09/2016 8:46 AM Performed by: Andree Elk, AMY A Pre-anesthesia Checklist: Patient identified, Suction available, Patient being monitored and Emergency Drugs available Oxygen Delivery Method: Simple face mask
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
## 2016-06-09 NOTE — Progress Notes (Signed)
Fleets enema done per pt. Lidocaine jelly used prior to enema. Small soft brown stool with orange fluid. Tolerated well.

## 2016-06-09 NOTE — Op Note (Addendum)
[facility] Patient Name: [name] Procedure Date: 06/09/2016 8:47 AM MRN: [id number] Date of Birth: 03/04/1951 Attending MD: Barney Drain , MD CSN: [id number] Age: 65 Admit Type: Outpatient Procedure:                Flexible Sigmoidoscopy with COLD FORCEPS POLYPECTOMY Indications:              Hematochezia, Rectal pain AFTER HEMORRHOIDECTOMY                            JUL 21 Providers:                Barney Drain, MD, Janeece Riggers, RN, Isabella Stalling,                            Technician Referring MD:             Modena Nunnery. [place], [place] DAVIS Medicines:                Propofol per Anesthesia Complications:            No immediate complications. Estimated Blood Loss:     Estimated blood loss was minimal. Procedure:                Pre-Anesthesia Assessment:                           - Prior to the procedure, a History and Physical                            was performed, and patient medications and                            allergies were reviewed. The patient's tolerance of                            previous anesthesia was also reviewed. The risks                            and benefits of the procedure and the sedation                            options and risks were discussed with the patient.                            All questions were answered, and informed consent                            was obtained. Prior Anticoagulants: The patient has                            taken no previous anticoagulant or antiplatelet                            agents. ASA Grade Assessment: II - A patient with  mild systemic disease. After reviewing the risks                            and benefits, the patient was deemed in                            satisfactory condition to undergo the procedure.                            After obtaining informed consent, the scope was                            passed under direct vision. The Flexible            sigmoidoscope was introduced through the anus and                            advanced to the the sigmoid colon. The flexible                            sigmoidoscopy was somewhat difficult due to the                            patient's discomfort during the procedure. The                            quality of the bowel preparation was good. The                            patient tolerated the procedure fairly well. Scope In: 8:56:32 AM Scope Out: 9:02:59 AM Total Procedure Duration: 0 hours 6 minutes 27 seconds  Findings:      The digital rectal exam findings include rectal tenderness.      Two sessile polyps were found in the rectum. The polyps were 2 to 3 mm       in size. These polyps were removed with a cold biopsy forceps. Resection       and retrieval were complete. Impression:               - Rectal tenderness found on digital rectal exam IN                            DENUDED POST SURGICAL AREA.                           - Two 2 to 3 mm polyps in the rectum, removed with                            a cold biopsy forceps. Moderate Sedation:      Per Anesthesia Care Recommendation:           - Resume previous diet.                           - [place] [facility] CREAM IF NEEDED TO CONTROL  RECTAL PAIN                           - Await pathology results.                           - Use Percocet (oxycodone + acetaminophen) 5 mg/325                            mg PO q 4 hrs #45 IF NEEDED FOR RECTAL PAIN.                           - DESITIN QID TO PERIMUM                           - Patient has a contact number available for                            emergencies. The signs and symptoms of potential                            delayed complications were discussed with the                            patient. Return to normal activities tomorrow.                            Written discharge instructions were provided to the                             patient. Procedure Code(s):        --- Professional ---                           [phone], Sigmoidoscopy, flexible; with biopsy, single                            or multiple Diagnosis Code(s):        --- Professional ---                           K62.89, Other specified diseases of anus and rectum                           K62.1, Rectal polyp                           K92.1, Melena (includes Hematochezia) CPT copyright 2016 American Medical Association. All rights reserved. The codes documented in this report are preliminary and upon coder review may  be revised to meet current compliance requirements. Barney Drain, MD Barney Drain, MD 06/09/2016 9:35:00 AM This report has been signed electronically. Number of Addenda: 0

## 2016-06-09 NOTE — Anesthesia Postprocedure Evaluation (Signed)
Anesthesia Post Note  Patient: [name]  Procedure(s) Performed: Procedure(s) (LRB): FLEXIBLE SIGMOIDOSCOPY (N/A)  Patient location during evaluation: PACU Anesthesia Type: MAC Level of consciousness: awake and alert and oriented Pain management: pain level controlled Vital Signs Assessment: post-procedure vital signs reviewed and stable Respiratory status: spontaneous breathing and patient connected to nasal cannula oxygen Cardiovascular status: stable Postop Assessment: no signs of nausea or vomiting Anesthetic complications: no    Last Vitals:  Vitals:   06/09/16 0835 06/09/16 0840  BP: 138/69 117/63  Pulse:    Resp: (!) 22 20  Temp:      Last Pain:  Vitals:   06/09/16 0815  PainSc: 9                  Kennen Stammer A

## 2016-06-09 NOTE — Anesthesia Preprocedure Evaluation (Signed)
Anesthesia Evaluation  Patient identified by MRN, date of birth, ID band Patient awake    Reviewed: Allergy & Precautions, NPO status , Patient's Chart, lab work & pertinent test results  History of Anesthesia Complications (+) PONV and history of anesthetic complications  Airway Mallampati: III  TM Distance: >3 FB Neck ROM: Full    Dental  (+) Teeth Intact   Pulmonary asthma , former smoker,    breath sounds clear to auscultation       Cardiovascular hypertension, Pt. on medications  Rhythm:Regular Rate:Normal     Neuro/Psych PSYCHIATRIC DISORDERS Anxiety Depression  Neuromuscular disease    GI/Hepatic GERD  Controlled and Medicated,(+) Hepatitis - (NASH)  Endo/Other    Renal/GU CRFRenal disease  negative genitourinary   Musculoskeletal  (+) Arthritis ,   Abdominal   Peds negative pediatric ROS (+)  Hematology negative hematology ROS (+) Sickle cell trait ,   Anesthesia Other Findings - G6PD Def.    Reproductive/Obstetrics negative OB ROS                             Anesthesia Physical Anesthesia Plan  ASA: III  Anesthesia Plan: MAC   Post-op Pain Management:    Induction: Intravenous  Airway Management Planned: Simple Face Mask  Additional Equipment:   Intra-op Plan:   Post-operative Plan:   Informed Consent: I have reviewed the patients History and Physical, chart, labs and discussed the procedure including the risks, benefits and alternatives for the proposed anesthesia with the patient or authorized representative who has indicated his/her understanding and acceptance.     Plan Discussed with:   Anesthesia Plan Comments:         Anesthesia Quick Evaluation

## 2016-06-10 ENCOUNTER — Telehealth: Payer: Self-pay | Admitting: Gastroenterology

## 2016-06-10 NOTE — Telephone Encounter (Signed)
REVIEWED-NO ADDITIONAL RECOMMENDATIONS. 

## 2016-06-10 NOTE — Telephone Encounter (Signed)
Pt wanted to let SF know that she feels much better and thank you.

## 2016-06-11 ENCOUNTER — Telehealth: Payer: Self-pay | Admitting: Gastroenterology

## 2016-06-11 NOTE — Telephone Encounter (Signed)
Reminder in epic °

## 2016-06-11 NOTE — Telephone Encounter (Signed)
Patient called in stating she's having a little rectal soreness and itching, however it's much better than it was prior to her procedure.  She has some lidocaine gel 2% & [facility] Hemorrhoid Cream  and she's using that.  Can she use some of the lidocaine Gel on the outside of her rectum?   Please call her at [phone]

## 2016-06-11 NOTE — Telephone Encounter (Signed)
Please call pt. She had HYPERPLASTIC POLYPS removed. DRINK WATER TO KEEP YOUR URINE LIGHT YELLOW. FOLLOW A HIGH FIBER DIET. NEXT TCS in 5 years.

## 2016-06-11 NOTE — Telephone Encounter (Signed)
PLEASE CALL PT. She can use cream on outside.

## 2016-06-11 NOTE — Telephone Encounter (Signed)
Pt is aware.  

## 2016-06-12 ENCOUNTER — Encounter (HOSPITAL_COMMUNITY): Payer: Self-pay | Admitting: Gastroenterology

## 2016-06-19 ENCOUNTER — Telehealth: Payer: Self-pay | Admitting: Gastroenterology

## 2016-06-19 NOTE — Telephone Encounter (Signed)
Pt called to see if her results from 9/5 were available. Please call [phone]

## 2016-06-23 NOTE — Telephone Encounter (Signed)
Pt is aware she can use the cream on the outside.

## 2016-06-23 NOTE — Telephone Encounter (Signed)
SEE TC SEP 7.

## 2016-06-23 NOTE — Telephone Encounter (Signed)
PT is aware of her results from colonoscopy. She said to tell Dr. Oneida Alar that she is feeling A LOT better. But, her bottom is still very irritated, and she cannot sit very long, so she is not able to get far from home.  She said she is using a Cream called AD and it is more like vaseline, but it soothes her bottom. She said she has had this problem since her surgery, and Dr. Oneida Alar is aware of it.

## 2016-06-26 ENCOUNTER — Telehealth: Payer: Self-pay | Admitting: *Deleted

## 2016-06-26 NOTE — Telephone Encounter (Signed)
Received call from patient.   Reports that she was changed to [place] 1-0.2% suspension for Glaucoma D/T her pressure be ing elevated.   Reports that eye dr Reubin Milan send prescriptions to [facility].   Requested MD to send in order.   Ok to order?

## 2016-06-28 NOTE — Telephone Encounter (Signed)
I would need a note or the actual prescription before sending this in, to verify the change and dose as I did not originally prescribe.

## 2016-06-29 NOTE — Telephone Encounter (Signed)
Patient aware.   Will need to send in prescription to [facility].

## 2016-06-30 ENCOUNTER — Telehealth: Payer: Self-pay | Admitting: Family Medicine

## 2016-06-30 NOTE — Telephone Encounter (Signed)
Patient called states she spoke with you on Friday and states that another office couldn't figure out how to send in a script to  [place] [facility]. She states the medication simbrinza 1/0.2 8 ml she states the [facility] # [phone] she states that the [facility] does carry this eye drops.   CB# [phone]

## 2016-06-30 NOTE — Telephone Encounter (Signed)
Call placed to patient.   Advised that MD will need to see actual prescription before we can send it in.   Patient to get prescription to [place].

## 2016-07-08 ENCOUNTER — Ambulatory Visit (INDEPENDENT_AMBULATORY_CARE_PROVIDER_SITE_OTHER): Admitting: Gastroenterology

## 2016-07-08 ENCOUNTER — Encounter: Payer: Self-pay | Admitting: Gastroenterology

## 2016-07-08 DIAGNOSIS — K648 Other hemorrhoids: Secondary | ICD-10-CM | POA: Diagnosis not present

## 2016-07-08 MED ORDER — OXYCODONE-ACETAMINOPHEN 5-325 MG PO TABS: 1.0000 | ORAL_TABLET | ORAL | 0 refills | Status: DC | PRN

## 2016-07-08 MED ORDER — NITROGLYCERIN 0.4 % RE OINT: TOPICAL_OINTMENT | RECTAL | 2 refills | Status: DC

## 2016-07-08 NOTE — Assessment & Plan Note (Signed)
S/P HEMORRHOIDECTOMY AND COURSE COMPLICATED BY RECTAL PAIN. EXTENSIVE SURGERY AND PT HAD ULCERS EXTENDING INTO PERIANAL REGION. EXAM IMPROVED BUT RECTAL PAIN PERSISTS IN SETTING OF A FISSURE.  DRINK WATER TO KEEP YOUR URINE LIGHT YELLOW. CONTINUE STOOL SOFTENER AND [place] [facility] CREAM. PLEASE  CALL IN 2 WEEKS IF SYMPTOMS YOU ARE NOT IMPROVING. IF NOT REFER TO [facility], PT DOES NOT WANT TO SEE DR. DAVID ANYMORE. ADD Nitroglycerin ointment 0.125 %. USE a pea sized amount internally four times daily FOR TWO MONTHS IT MAY CAUSE HEADACHES OR DROP IN BLOOD PRESSURE. TAKE FIRST DOSE AND REMAIN SEATED OR LAYING DOWN FOR 10-15 MINS. FOLLOW UP IN 2 MOS.

## 2016-07-08 NOTE — Patient Instructions (Addendum)
DRINK WATER TO KEEP YOUR URINE LIGHT YELLOW.  CONTINUE STOOL SOFTENER AND [place] [facility] CREAM.  PLEASE  CALL IN 2 WEEKS IF SYMPTOMS YOU ARE NOT IMPROVING. I WILL MAKE ETHE REFERRAL TO CENTRAL  SURGERY.  ADD Nitroglycerin ointment 0.125 %. USE a pea sized amount internally four times daily FOR TWO MONTHS. IT MAY CAUSE HEADACHES OR DROP IN BLOOD PRESSURE. TAKE FIRST DOSE AND REMAIN SEATED OR LAYING DOWN FOR 10-15 MINS.  FOLLOW UP IN 2 MOS.
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
## 2016-07-08 NOTE — Progress Notes (Signed)
cc'ed to pcp °

## 2016-07-08 NOTE — Progress Notes (Signed)
Appointment made

## 2016-07-27 ENCOUNTER — Telehealth: Payer: Self-pay | Admitting: Family Medicine

## 2016-07-27 DIAGNOSIS — I10 Essential (primary) hypertension: Secondary | ICD-10-CM

## 2016-07-27 MED ORDER — DEXLANSOPRAZOLE 60 MG PO CPDR: 60.0000 mg | DELAYED_RELEASE_CAPSULE | Freq: Every day | ORAL | 3 refills | Status: DC

## 2016-07-27 MED ORDER — IRBESARTAN 150 MG PO TABS: 75.0000 mg | ORAL_TABLET | Freq: Every day | ORAL | 3 refills | Status: DC

## 2016-07-27 MED ORDER — HYDROCHLOROTHIAZIDE 12.5 MG PO TABS: 12.5000 mg | ORAL_TABLET | Freq: Every day | ORAL | 3 refills | Status: DC

## 2016-07-27 MED ORDER — SENNA 8.6 MG PO TABS: 1.0000 | ORAL_TABLET | Freq: Every day | ORAL | 3 refills | Status: DC

## 2016-07-27 MED ORDER — ZOLPIDEM TARTRATE 10 MG PO TABS: ORAL_TABLET | ORAL | 0 refills | Status: DC

## 2016-07-27 MED ORDER — ALPRAZOLAM 1 MG PO TABS: 1.0000 mg | ORAL_TABLET | Freq: Two times a day (BID) | ORAL | 0 refills | Status: DC | PRN

## 2016-07-27 NOTE — Telephone Encounter (Signed)
Patient is calling to get the following refills to be sent to [facility]   Dexilant, alprazolam, zolpidem, hydrochlorothiazide, senokot, irbesartan

## 2016-07-27 NOTE — Telephone Encounter (Signed)
Ok to refill??  Last office visit 05/20/2016.  Last refill 02/26/2016 with 90 day supply.

## 2016-07-27 NOTE — Telephone Encounter (Signed)
okay

## 2016-07-27 NOTE — Telephone Encounter (Signed)
Prescription faxed

## 2016-08-11 ENCOUNTER — Ambulatory Visit (INDEPENDENT_AMBULATORY_CARE_PROVIDER_SITE_OTHER): Admitting: Family Medicine

## 2016-08-11 ENCOUNTER — Encounter: Payer: Self-pay | Admitting: Family Medicine

## 2016-08-11 VITALS — BP 118/78 | HR 82 | Temp 98.5°F | Resp 16 | Ht 67.0 in | Wt 211.0 lb

## 2016-08-11 DIAGNOSIS — Z23 Encounter for immunization: Secondary | ICD-10-CM

## 2016-08-11 DIAGNOSIS — E782 Mixed hyperlipidemia: Secondary | ICD-10-CM

## 2016-08-11 DIAGNOSIS — N182 Chronic kidney disease, stage 2 (mild): Secondary | ICD-10-CM

## 2016-08-11 DIAGNOSIS — K6289 Other specified diseases of anus and rectum: Secondary | ICD-10-CM

## 2016-08-11 DIAGNOSIS — I10 Essential (primary) hypertension: Secondary | ICD-10-CM

## 2016-08-11 DIAGNOSIS — F5104 Psychophysiologic insomnia: Secondary | ICD-10-CM

## 2016-08-11 MED ORDER — ONDANSETRON HCL 4 MG PO TABS: 4.0000 mg | ORAL_TABLET | Freq: Three times a day (TID) | ORAL | 1 refills | Status: DC | PRN

## 2016-08-11 MED ORDER — NITROGLYCERIN 0.4 MG/SPRAY TL SOLN: 1.0000 | 3 refills | Status: DC | PRN

## 2016-08-11 MED ORDER — ALBUTEROL SULFATE HFA 108 (90 BASE) MCG/ACT IN AERS: 2.0000 | INHALATION_SPRAY | RESPIRATORY_TRACT | 6 refills | Status: DC | PRN

## 2016-08-11 MED ORDER — OXYCODONE-ACETAMINOPHEN 5-325 MG PO TABS: 1.0000 | ORAL_TABLET | ORAL | 0 refills | Status: DC | PRN

## 2016-08-11 MED ORDER — HYDROCOD POLST-CPM POLST ER 10-8 MG/5ML PO SUER: 5.0000 mL | Freq: Two times a day (BID) | ORAL | 0 refills | Status: DC | PRN

## 2016-08-11 NOTE — Patient Instructions (Signed)
Medications refilled Flu shot done Get the labs  F/U 4 months

## 2016-08-11 NOTE — Progress Notes (Signed)
   Subjective:    Patient ID: [name], female    DOB: 07/16/1951, 65 y.o.   MRN: [id number]  Patient presents for hemorrhoid surgery (follow up) Seen by GI since hemmoroid surgery given NTG cream, still feels like it is not healing , alsohas lidocaine cream On miralax and senna to keep bowels very soft, to prevent rectal pain Uses pain medication for severe pain Request a refill on this    She still following with Dr. Mordecai Rasmussen for her guaiac, she had a new drop started a few days ago.  She requests refill on some of her medications including a cough medicine she uses for bronchitis upper respiratory issues.  We shot given  Review Of Systems:  GEN- denies fatigue, fever, weight loss,weakness, recent illness HEENT- denies eye drainage, change in vision, nasal discharge, CVS- denies chest pain, palpitations RESP- denies SOB, cough, wheeze ABD- denies N/V, change in stools, abd pain GU- denies dysuria, hematuria, dribbling, incontinence MSK- denies joint pain, muscle aches, injury Neuro- denies headache, dizziness, syncope, seizure activity       Objective:    BP 118/78   Pulse 82   Temp 98.5 F (36.9 C) (Oral)   Resp 16   Ht 5' 7"  (1.702 m)   Wt 211 lb (95.7 kg)   SpO2 98%   BMI 33.05 kg/m  GEN- NAD, alert and oriented x3 HEENT- PERRL, EOMI, non injected sclera, pink conjunctiva, MMM, oropharynx clear CVS- RRR, no murmur RESP-CTAB ABD-NABS,soft,NT,ND Psych- anxious appearing, not depressed, good eye contact, no SI  EXT- No edema Pulses- Radial, DP- 2+        Assessment & Plan:      Problem List Items Addressed This Visit    Rectal pain    Rectal pain status post hemorrhoid surgery and that she had another procedure polyps removed. I've refilled her hydrocodone      Hyperlipidemia - Primary   Relevant Medications   nitroGLYCERIN (NITROLINGUAL) 0.4 MG/SPRAY spray   Other Relevant Orders   Comprehensive metabolic panel   Lipid panel   Essential  hypertension, benign   Relevant Medications   nitroGLYCERIN (NITROLINGUAL) 0.4 MG/SPRAY spray   Other Relevant Orders   Comprehensive metabolic panel   CKD (chronic kidney disease), stage II    Recheck renal function as well as liver function. Her blood pressure is well-controlled change in medications. We'll also check her lipid panel Continue to reiterate healthy eating she states that she is not eating much however she is gaining weight      Chronic insomnia    Continue with alprazolam as needed       Other Visit Diagnoses    Flu vaccine need       Relevant Orders   Flu Vaccine QUAD 36+ mos IM (Completed)      Note: This dictation was prepared with Dragon dictation along with smaller phrase technology. Any transcriptional errors that result from this process are unintentional.

## 2016-08-12 DIAGNOSIS — K6289 Other specified diseases of anus and rectum: Secondary | ICD-10-CM | POA: Insufficient documentation

## 2016-08-12 NOTE — Assessment & Plan Note (Signed)
Rectal pain status post hemorrhoid surgery and that she had another procedure polyps removed. I've refilled her hydrocodone

## 2016-08-12 NOTE — Assessment & Plan Note (Signed)
Continue with alprazolam as needed

## 2016-08-12 NOTE — Assessment & Plan Note (Signed)
Recheck renal function as well as liver function. Her blood pressure is well-controlled change in medications. We'll also check her lipid panel Continue to reiterate healthy eating she states that she is not eating much however she is gaining weight

## 2016-08-18 ENCOUNTER — Ambulatory Visit: Admitting: Family Medicine

## 2016-09-03 ENCOUNTER — Encounter: Payer: Self-pay | Admitting: Gastroenterology

## 2016-09-03 ENCOUNTER — Ambulatory Visit (INDEPENDENT_AMBULATORY_CARE_PROVIDER_SITE_OTHER): Admitting: Gastroenterology

## 2016-09-03 DIAGNOSIS — K7581 Nonalcoholic steatohepatitis (NASH): Secondary | ICD-10-CM | POA: Diagnosis not present

## 2016-09-03 DIAGNOSIS — K601 Chronic anal fissure: Secondary | ICD-10-CM | POA: Diagnosis not present

## 2016-09-03 DIAGNOSIS — K219 Gastro-esophageal reflux disease without esophagitis: Secondary | ICD-10-CM | POA: Diagnosis not present

## 2016-09-03 DIAGNOSIS — K648 Other hemorrhoids: Secondary | ICD-10-CM

## 2016-09-03 NOTE — Assessment & Plan Note (Addendum)
WEIGHT UP SINCE LAST YEAR.  CONTINUE YOUR WEIGHT LOSS EFFORTS. START LOW FODMAP DIET AFTER RETURNING FROM [facility]. HANDOUT GIVEN. FOLLOW UP IN 4 MOS.

## 2016-09-03 NOTE — Progress Notes (Signed)
cc'ed to pcp °

## 2016-09-03 NOTE — Progress Notes (Signed)
ON RECALL  °

## 2016-09-03 NOTE — Assessment & Plan Note (Signed)
COMPLICATED POSTOP COURSE AFTER HEMORRHOIDECTOMY. CLINICALLY IMPROVED, BUT RECTAL PAIN STILL NOT RESOLVED.  CONTINUE Nitroglycerin ointment Apply a pea sized amount internally four times daily. CONTINUE LIDOCAINE/HYDROCORTISONE PREP. USE IMODIUM IF NEEDED FOR DIARRHEA. CONTINUE MIRALAX TO PREVENT CONSTIPATION.  FOLLOW UP IN 4 MOS.

## 2016-09-03 NOTE — Assessment & Plan Note (Signed)
AFTER HEMORRHOIDS SURGERY AND FLARE OF DIARRHEA LIKELY DUE TO TOXIN V. INFECTIOUS.   CONTINUE Nitroglycerin ointment Apply a pea sized amount internally four times daily. CONTINUE LIDOCAINE/HYDROCORTISONE PREP. USE IMODIUM IF NEEDED FOR DIARRHEA. FOLLOW UP IN 4 MOS.

## 2016-09-03 NOTE — Patient Instructions (Addendum)
CONTINUE Nitroglycerin ointment Apply a pea sized amount internally four times daily.  CONTINUE LIDOCAINE/HYDROCORTISONE PREP.  CONTINUE YOUR WEIGHT LOSS EFFORTS. START LOW FODMAP DIET AFTER RETURNING FROM [facility]. SEE HANDOUT.  USE IMODIUM IF NEEDED FOR DIARRHEA.  CONTINUE MIRALAX TO PREVENT CONSTIPATION.  FOLLOW UP IN 4 MOS.
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
## 2016-09-03 NOTE — Assessment & Plan Note (Signed)
SYMPTOMS FAIRLY WELL CONTROLLED.  CONTINUE TO MONITOR SYMPTOMS. ZOFRAN PRN. CONTINUE DEXILANT. FOLLOW UP IN 4 MOS.

## 2016-10-09 ENCOUNTER — Telehealth: Payer: Self-pay | Admitting: Family Medicine

## 2016-10-09 NOTE — Telephone Encounter (Signed)
Call placed to patient.   Will need OV.   Advised to go to UC/ER for evaluation since no appointments available today.

## 2016-10-09 NOTE — Telephone Encounter (Signed)
Patient calling to see if she can get an antibiotic called into [facility] for a bladder infection  [phone]

## 2016-10-30 ENCOUNTER — Telehealth: Payer: Self-pay | Admitting: Family Medicine

## 2016-10-30 DIAGNOSIS — I10 Essential (primary) hypertension: Secondary | ICD-10-CM

## 2016-10-30 MED ORDER — DEXLANSOPRAZOLE 60 MG PO CPDR: 60.0000 mg | DELAYED_RELEASE_CAPSULE | Freq: Every day | ORAL | 3 refills | Status: DC

## 2016-10-30 MED ORDER — BRINZOLAMIDE-BRIMONIDINE 1-0.2 % OP SUSP: OPHTHALMIC | 3 refills | Status: DC

## 2016-10-30 MED ORDER — ZOLPIDEM TARTRATE 10 MG PO TABS: ORAL_TABLET | ORAL | 0 refills | Status: DC

## 2016-10-30 MED ORDER — IRBESARTAN 150 MG PO TABS: 75.0000 mg | ORAL_TABLET | Freq: Every day | ORAL | 3 refills | Status: DC

## 2016-10-30 MED ORDER — ALPRAZOLAM 1 MG PO TABS: 1.0000 mg | ORAL_TABLET | Freq: Two times a day (BID) | ORAL | 0 refills | Status: DC | PRN

## 2016-10-30 MED ORDER — FENOFIBRATE 145 MG PO TABS: 145.0000 mg | ORAL_TABLET | Freq: Every day | ORAL | 3 refills | Status: DC

## 2016-10-30 MED ORDER — LATANOPROST 0.005 % OP SOLN: 1.0000 [drp] | Freq: Every day | OPHTHALMIC | 3 refills | Status: DC

## 2016-10-30 MED ORDER — ALBUTEROL SULFATE HFA 108 (90 BASE) MCG/ACT IN AERS: 2.0000 | INHALATION_SPRAY | RESPIRATORY_TRACT | 3 refills | Status: DC | PRN

## 2016-10-30 MED ORDER — HYDROCHLOROTHIAZIDE 12.5 MG PO TABS: 12.5000 mg | ORAL_TABLET | Freq: Every day | ORAL | 3 refills | Status: DC

## 2016-10-30 NOTE — Telephone Encounter (Signed)
Prescription faxed

## 2016-10-30 NOTE — Telephone Encounter (Signed)
Patient requesting refill on her medication called into [facility] she needs tricor, hydrochlorothiazide, irbesartan, Ambien, dexilant, alprazolam, latanoprost, and brimonidine,   CB# [phone]

## 2016-10-30 NOTE — Telephone Encounter (Signed)
She also needs albuterol inhaler

## 2016-10-30 NOTE — Telephone Encounter (Signed)
Okay to refill? 

## 2016-10-30 NOTE — Telephone Encounter (Signed)
Ok to refill to mail order?

## 2016-11-10 ENCOUNTER — Encounter: Payer: Self-pay | Admitting: Gastroenterology

## 2016-11-16 ENCOUNTER — Telehealth: Payer: Self-pay
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
## 2016-11-24 ENCOUNTER — Ambulatory Visit (INDEPENDENT_AMBULATORY_CARE_PROVIDER_SITE_OTHER): Admitting: Family Medicine

## 2016-11-24 ENCOUNTER — Encounter: Payer: Self-pay | Admitting: Family Medicine

## 2016-11-24 VITALS — BP 128/74 | HR 90 | Temp 98.6°F | Resp 16 | Ht 66.0 in | Wt 207.0 lb

## 2016-11-24 DIAGNOSIS — M791 Myalgia, unspecified site: Secondary | ICD-10-CM

## 2016-11-24 DIAGNOSIS — E782 Mixed hyperlipidemia: Secondary | ICD-10-CM

## 2016-11-24 DIAGNOSIS — R5383 Other fatigue: Secondary | ICD-10-CM | POA: Diagnosis not present

## 2016-11-24 DIAGNOSIS — N390 Urinary tract infection, site not specified: Secondary | ICD-10-CM | POA: Diagnosis not present

## 2016-11-24 DIAGNOSIS — N182 Chronic kidney disease, stage 2 (mild): Secondary | ICD-10-CM | POA: Diagnosis not present

## 2016-11-24 LAB — URINALYSIS, MICROSCOPIC ONLY
Crystals: NONE SEEN [HPF]
RBC / HPF: NONE SEEN RBC/HPF (ref <=2)
YEAST: NONE SEEN [HPF]

## 2016-11-24 LAB — URINALYSIS, ROUTINE W REFLEX MICROSCOPIC
Bilirubin Urine: NEGATIVE
GLUCOSE, UA: NEGATIVE
Hgb urine dipstick: NEGATIVE
Ketones, ur: NEGATIVE
Nitrite: NEGATIVE
SPECIFIC GRAVITY, URINE: 1.025 (ref 1.001–1.035)
pH: 5.5 (ref 5.0–8.0)

## 2016-11-24 MED ORDER — CEPHALEXIN 500 MG PO CAPS: 500.0000 mg | ORAL_CAPSULE | Freq: Two times a day (BID) | ORAL | 0 refills | Status: DC

## 2016-11-24 NOTE — Progress Notes (Signed)
   Subjective:    Patient ID: [name], female    DOB: 1950/12/18, 66 y.o.   MRN: [id number]  Patient presents for Malaise (x months- states that she has generalized weakness and low energy)    Feels fatigued all the time , has weakness, low energy, has chills on and off. Has muscle aches, cough occasionally. Just feels bad " thinks something wrong with her". Has nausea all the time, no vomiting, no diarrhea. Feels like she is going to pass out. Appetite is down, eating smaller amounts, weight down 7lbs since November  Today has had some burning with urination, has actually had on and off for past few weeks   Taking additional magnesium and fish oil and her regular medications  Has not taken xanax recently    Hypertension-  Taking 1/2 tablet of Avapro  And HCTZ 166m    Review Of Systems:  GEN- + fatigue, fever, +weight loss+,weakness, recent illness HEENT- denies eye drainage, change in vision, +nasal discharge in mornings  CVS- denies chest pain, palpitations RESP- denies SOB, +cough, wheeze ABD- + N no /V, change in stools, abd pain GU- + dysuria, hematuria, dribbling, incontinence MSK- denies joint pain,+ muscle aches, injury Neuro-+ headache,+ dizziness, syncope, seizure activity       Objective:    BP 128/74   Pulse 90   Temp 98.6 F (37 C) (Oral)   Resp 16   Ht 5' 6"  (1.676 m)   Wt 207 lb (93.9 kg)   SpO2 96%   BMI 33.41 kg/m  GEN- NAD, alert and oriented x3 HEENT- PERRL, EOMI, non injected sclera, pink conjunctiva, MMM, oropharynx clear CVS- RRR, no murmur RESP-CTAB ABD-NABS,soft,NT,ND, no CVA tenderness Psych- not anxios not depressed, good eye contact, no SI  EXT- No edema Pulses- Radial 2+        Assessment & Plan:      Problem List Items Addressed This Visit    Hyperlipidemia - Primary    Check cholesterol, fasting      Relevant Orders   Lipid panel   CKD (chronic kidney disease), stage II    Other Visit Diagnoses    Fatigue,  unspecified type       Exam is benign, check labs, check ANA, she has had increased difficulty since her husband died, so I think there is a mental componenet at well. She has had some weight loss. Her cancer prevention screening is up-to-date.  Her last CT of chest was in 2016 did not show any significant lung nodules the ones that she had previously had resolved by 2015    Relevant Orders   CBC with Differential/Platelet   Comprehensive metabolic panel   TSH   Vitamin B12   ANA   Myalgia       Urinary tract infection without hematuria, site unspecified       Start keflex, urine culture sent to see if true infection   Relevant Medications   cephALEXin (KEFLEX) 500 MG capsule   Other Relevant Orders   Urinalysis, Routine w reflex microscopic (Completed)   Urine culture      Note: This dictation was prepared with Dragon dictation along with smaller phrase technology. Any transcriptional errors that result from this process are unintentional.

## 2016-11-24 NOTE — Patient Instructions (Addendum)
We will call with culture results Continue current medications Hold the HCTZ CANCEL APPT FOR THE MARCH 9TH  F/U pending results

## 2016-11-24 NOTE — Assessment & Plan Note (Signed)
Check cholesterol, fasting

## 2016-11-25 LAB — ANA: Anti Nuclear Antibody(ANA): NEGATIVE

## 2016-11-25 LAB — CBC WITH DIFFERENTIAL/PLATELET
BASOS PCT: 1 %
Basophils Absolute: 62 cells/uL (ref 0–200)
EOS ABS: 186 {cells}/uL (ref 15–500)
Eosinophils Relative: 3 %
HEMATOCRIT: 38.9 % (ref 35.0–45.0)
Hemoglobin: 13.3 g/dL (ref 12.0–15.0)
LYMPHS ABS: 1426 {cells}/uL (ref 850–3900)
Lymphocytes Relative: 23 %
MCH: 32.4 pg (ref 27.0–33.0)
MCHC: 34.2 g/dL (ref 32.0–36.0)
MCV: 94.9 fL (ref 80.0–100.0)
MPV: 10.2 fL (ref 7.5–12.5)
Monocytes Absolute: 496 cells/uL (ref 200–950)
Monocytes Relative: 8 %
Neutro Abs: 4030 cells/uL (ref 1500–7800)
Neutrophils Relative %: 65 %
Platelets: 222 10*3/uL (ref 140–400)
RBC: 4.1 MIL/uL (ref 3.80–5.10)
RDW: 12.7 % (ref 11.0–15.0)
WBC: 6.2 10*3/uL (ref 3.8–10.8)

## 2016-11-25 LAB — COMPREHENSIVE METABOLIC PANEL
ALK PHOS: 40 U/L (ref 33–130)
ALT: 69 U/L — AB (ref 6–29)
AST: 95 U/L — AB (ref 10–35)
Albumin: 4.6 g/dL (ref 3.6–5.1)
BUN: 18 mg/dL (ref 7–25)
CALCIUM: 10 mg/dL (ref 8.6–10.4)
CO2: 22 mmol/L (ref 20–31)
Chloride: 102 mmol/L (ref 98–110)
Creat: 1.54 mg/dL — ABNORMAL HIGH (ref 0.50–0.99)
Glucose, Bld: 119 mg/dL — ABNORMAL HIGH (ref 70–99)
POTASSIUM: 4.3 mmol/L (ref 3.5–5.3)
Sodium: 137 mmol/L (ref 135–146)
TOTAL PROTEIN: 7.3 g/dL (ref 6.1–8.1)
Total Bilirubin: 0.9 mg/dL (ref 0.2–1.2)

## 2016-11-25 LAB — LIPID PANEL
CHOL/HDL RATIO: 8 ratio — AB (ref <5.0)
CHOLESTEROL: 193 mg/dL (ref <200)
HDL: 24 mg/dL — AB (ref >50)
LDL Cholesterol: 121 mg/dL — ABNORMAL HIGH (ref <100)
Triglycerides: 239 mg/dL — ABNORMAL HIGH (ref <150)
VLDL: 48 mg/dL — ABNORMAL HIGH (ref <30)

## 2016-11-25 LAB — VITAMIN B12: Vitamin B-12: 681 pg/mL (ref 200–1100)

## 2016-11-25 LAB — TSH: TSH: 1.66 m[IU]/L

## 2016-11-26 ENCOUNTER — Other Ambulatory Visit: Payer: Self-pay | Admitting: *Deleted

## 2016-11-26 DIAGNOSIS — N289 Disorder of kidney and ureter, unspecified: Secondary | ICD-10-CM

## 2016-11-26 LAB — URINE CULTURE: Organism ID, Bacteria: NO GROWTH

## 2016-11-27 ENCOUNTER — Telehealth: Payer: Self-pay | Admitting: Family Medicine

## 2016-11-27 NOTE — Telephone Encounter (Signed)
No she can stay off the HCTZ for now

## 2016-11-27 NOTE — Telephone Encounter (Signed)
Pt made aware for now just stay off HCTZ

## 2016-11-27 NOTE — Telephone Encounter (Signed)
Patient talked to christina yesterday, however still needs to know if she still needs to be taking the hydrochlorothiazide  Please call her back at [phone]

## 2016-12-02 ENCOUNTER — Ambulatory Visit (HOSPITAL_COMMUNITY)

## 2016-12-03 ENCOUNTER — Encounter: Payer: Self-pay | Admitting: Gastroenterology

## 2016-12-03 ENCOUNTER — Ambulatory Visit (INDEPENDENT_AMBULATORY_CARE_PROVIDER_SITE_OTHER): Admitting: Gastroenterology

## 2016-12-03 DIAGNOSIS — K219 Gastro-esophageal reflux disease without esophagitis: Secondary | ICD-10-CM

## 2016-12-03 DIAGNOSIS — K648 Other hemorrhoids: Secondary | ICD-10-CM

## 2016-12-03 DIAGNOSIS — K7581 Nonalcoholic steatohepatitis (NASH): Secondary | ICD-10-CM

## 2016-12-03 DIAGNOSIS — K582 Mixed irritable bowel syndrome: Secondary | ICD-10-CM | POA: Diagnosis not present

## 2016-12-03 MED ORDER — OXYCODONE-ACETAMINOPHEN 5-325 MG PO TABS: 1.0000 | ORAL_TABLET | Freq: Four times a day (QID) | ORAL | 0 refills | Status: DC | PRN

## 2016-12-03 NOTE — Patient Instructions (Addendum)
DRINK WATER TO KEEP YOUR URINE LIGHT YELLOW.  USE LIDOCAINE CREAM AND/OR NTG CREAM IF NEEDED FOR RECTAL BLEEDING/PAIN.  USE PERCOCET IF NEEDED.  CONTINUE MIRALAX & SENNA TO AVOID FORMED STOOL/CONSTIPATION.  FOLLOW UP IN 4 MOS.

## 2016-12-03 NOTE — Assessment & Plan Note (Signed)
SYMPTOMS CONTROLLED/RESOLVED.  CONTINUE DEXILANT
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
## 2016-12-03 NOTE — Assessment & Plan Note (Signed)
SYMPTOMS FAIRLY WELL CONTROLLED.  DRINK WATER TO KEEP YOUR URINE LIGHT YELLOW. USE LIDOCAINE CREAM AND/OR NTG CREAM IF NEEDED FOR RECTAL BLEEDING/PAIN. CONTINUE FDGARD CONTINUE [place] TO AVOID FORMED STOOL/CONSTIPATION.  FOLLOW UP IN 4 MOS.

## 2016-12-03 NOTE — Assessment & Plan Note (Signed)
WEIGHT UP 2 LBS.  CONTINUE YOUR WEIGHT LOSS EFFORTS. FOLLOW UP IN 4 MOS.

## 2016-12-03 NOTE — Assessment & Plan Note (Addendum)
S/P HEMORRHOIDECTOMY JUL [id number] COMPLICATED BY PROLONGED RECTAL PAIN/BLEEDING. SLOWLY CLINICALLY IMPROVED. SYMPTOMS NOT IDEALLY CONTROLLED.  DRINK WATER TO KEEP YOUR URINE LIGHT YELLOW. USE LIDOCAINE CREAM AND/OR NTG CREAM IF NEEDED FOR RECTAL BLEEDING/PAIN. CONTINUE FDGARD REASSURED PT SHE WILL CONTINUE TO GET BETTER. DECLINES SURGICAL REVALUATION AT THIS TIME. PERCOCET IF NEEDED. CONTINUE MIRALAX & SENNA TO AVOID FORMED STOOL/CONSTIPATION.  FOLLOW UP IN 4 MOS.

## 2016-12-03 NOTE — Progress Notes (Signed)
cc'ed to pcp °

## 2016-12-04 NOTE — Progress Notes (Signed)
ON RECALL  °

## 2016-12-07 ENCOUNTER — Other Ambulatory Visit: Payer: Self-pay | Admitting: Family Medicine

## 2016-12-07 ENCOUNTER — Ambulatory Visit (HOSPITAL_COMMUNITY)
Admission: RE | Admit: 2016-12-07 | Discharge: 2016-12-07 | Disposition: A | Discharge status: Home / Self Care | Source: Ambulatory Visit | Attending: Family Medicine | Admitting: Family Medicine

## 2016-12-07 DIAGNOSIS — K760 Fatty (change of) liver, not elsewhere classified: Secondary | ICD-10-CM | POA: Insufficient documentation

## 2016-12-07 DIAGNOSIS — N289 Disorder of kidney and ureter, unspecified: Secondary | ICD-10-CM | POA: Insufficient documentation

## 2016-12-11 ENCOUNTER — Ambulatory Visit: Admitting: Family Medicine

## 2016-12-29 ENCOUNTER — Ambulatory Visit (INDEPENDENT_AMBULATORY_CARE_PROVIDER_SITE_OTHER): Admitting: Family Medicine

## 2016-12-29 ENCOUNTER — Encounter: Payer: Self-pay | Admitting: Family Medicine

## 2016-12-29 ENCOUNTER — Telehealth: Payer: Self-pay

## 2016-12-29 VITALS — BP 126/70 | HR 82 | Temp 99.9°F | Resp 18 | Ht 67.0 in | Wt 219.0 lb

## 2016-12-29 DIAGNOSIS — J4521 Mild intermittent asthma with (acute) exacerbation: Secondary | ICD-10-CM

## 2016-12-29 DIAGNOSIS — J069 Acute upper respiratory infection, unspecified: Secondary | ICD-10-CM | POA: Diagnosis not present

## 2016-12-29 DIAGNOSIS — B9789 Other viral agents as the cause of diseases classified elsewhere: Secondary | ICD-10-CM

## 2016-12-29 MED ORDER — ZOLPIDEM TARTRATE 10 MG PO TABS: ORAL_TABLET | ORAL | 2 refills | Status: DC

## 2016-12-29 MED ORDER — LATANOPROST 0.005 % OP SOLN: 1.0000 [drp] | Freq: Every day | OPHTHALMIC | 3 refills | Status: DC

## 2016-12-29 MED ORDER — DEXLANSOPRAZOLE 60 MG PO CPDR: 60.0000 mg | DELAYED_RELEASE_CAPSULE | Freq: Every day | ORAL | 3 refills | Status: DC

## 2016-12-29 MED ORDER — PREDNISONE 10 MG PO TABS: ORAL_TABLET | ORAL | 0 refills | Status: DC

## 2016-12-29 MED ORDER — BRIMONIDINE TARTRATE 0.2 % OP SOLN: 1.0000 [drp] | Freq: Two times a day (BID) | OPHTHALMIC | 6 refills | Status: DC

## 2016-12-29 MED ORDER — LIFITEGRAST 5 % OP SOLN: 1.0000 [drp] | Freq: Two times a day (BID) | OPHTHALMIC | 6 refills | Status: DC

## 2016-12-29 MED ORDER — FENOFIBRATE 145 MG PO TABS: 145.0000 mg | ORAL_TABLET | Freq: Every day | ORAL | 3 refills | Status: DC

## 2016-12-29 MED ORDER — OXYCODONE-ACETAMINOPHEN 5-325 MG PO TABS: 1.0000 | ORAL_TABLET | Freq: Four times a day (QID) | ORAL | 0 refills | Status: DC | PRN

## 2016-12-29 NOTE — Patient Instructions (Signed)
F/u as previous

## 2016-12-29 NOTE — Progress Notes (Signed)
   Subjective:    Patient ID: [name], female    DOB: 05-04-1951, 66 y.o.   MRN: [id number]  Patient presents for Illness (x1 day- sore throat, productive cough with yellow mucus, wheezing, body aches)   Cough started yesterday, felt wheezing this morning used her nebulizer, low grade fever, mild body aches. Cough with mild production. Took some advil this morning.   She has a prescription from her ophthalmologist that need to be filled to her mail order  Review Of Systems:  GEN- denies fatigue, +fever, weight loss,weakness, recent illness HEENT- denies eye drainage, change in vision, nasal discharge, CVS- denies chest pain, palpitations RESP- denies SOB, +cough, +wheeze ABD- denies N/V, change in stools, abd pain GU- denies dysuria, hematuria, dribbling, incontinence MSK- denies joint pain, muscle aches, injury Neuro- denies headache, dizziness, syncope, seizure activity       Objective:    BP 126/70   Pulse 82   Temp 99.9 F (37.7 C) (Oral)   Resp 18   Ht 5' 7"  (1.702 m)   Wt 219 lb (99.3 kg)   SpO2 98%   BMI 34.30 kg/m  GEN- NAD, alert and oriented x3 HEENT- PERRL, EOMI, non injected sclera, pink conjunctiva, MMM, oropharynx clear, TM clear bilat no effusion, TM clear no effusion +no  maxillary sinus tenderness,+ Nasal drainage  Neck- Supple, no LAD CVS- RRR, no murmur RESP- no wheeze, no rales, no rhonchi EXT- No edema Pulses- Radial 2+         Assessment & Plan:      Problem List Items Addressed This Visit    Asthma   Relevant Medications   predniSONE (DELTASONE) 10 MG tablet    Other Visit Diagnoses    Viral URI    -  Primary   Early viral illness but affecting her asthma, OTC cough med, continue nebulizer as needed, given steroid taper for her asthma based on history, recheck Monday       Note: This dictation was prepared with Dragon dictation along with smaller phrase technology. Any transcriptional errors that result from this process are  unintentional.

## 2016-12-29 NOTE — Telephone Encounter (Signed)
Patient called and states she has chest congestion that started last night no fever, put vicks on chest using inhaler coughing up yellowish phelgm,Patient states she is wheezing. No SOB no chest pain, Instructed to use nebulizer q4h. Patient states she is feeling nausea I explained she can eat  a Brat diet and drink plenty of fluids to keep from getting dehydrated. Also instructed patient she could take her zofran if she felt she needed it. Patient stated she still had  cough medication that was prescribed to her I told her she could use it as needed.  Patient states her throat is a little sore told patient she could gargle in warm salt water or sip on warm tea.Offered to have patient come in to be seen today, Patient states she does not drive and will see if she can come in tomorrow. Told patient if she notice her symptoms getting worse start having chest pain or SOB  Then she need to be seen right away

## 2016-12-30 ENCOUNTER — Ambulatory Visit: Payer: Self-pay | Admitting: Family Medicine

## 2017-01-04 ENCOUNTER — Ambulatory Visit (INDEPENDENT_AMBULATORY_CARE_PROVIDER_SITE_OTHER): Admitting: Family Medicine

## 2017-01-04 ENCOUNTER — Encounter: Payer: Self-pay | Admitting: Family Medicine

## 2017-01-04 VITALS — BP 124/66 | HR 86 | Temp 98.9°F | Resp 14 | Ht 67.0 in | Wt 212.0 lb

## 2017-01-04 DIAGNOSIS — R300 Dysuria: Secondary | ICD-10-CM

## 2017-01-04 DIAGNOSIS — N182 Chronic kidney disease, stage 2 (mild): Secondary | ICD-10-CM | POA: Diagnosis not present

## 2017-01-04 LAB — COMPLETE METABOLIC PANEL WITH GFR
ALT: 65 U/L — AB (ref 6–29)
AST: 84 U/L — AB (ref 10–35)
Albumin: 4.7 g/dL (ref 3.6–5.1)
Alkaline Phosphatase: 42 U/L (ref 33–130)
BUN: 18 mg/dL (ref 7–25)
CHLORIDE: 100 mmol/L (ref 98–110)
CO2: 23 mmol/L (ref 20–31)
CREATININE: 1.41 mg/dL — AB (ref 0.50–0.99)
Calcium: 9.8 mg/dL (ref 8.6–10.4)
GFR, Est African American: 45 mL/min — ABNORMAL LOW (ref >=60)
GFR, Est Non African American: 39 mL/min — ABNORMAL LOW (ref >=60)
GLUCOSE: 109 mg/dL — AB (ref 70–99)
Potassium: 3.9 mmol/L (ref 3.5–5.3)
SODIUM: 136 mmol/L (ref 135–146)
TOTAL PROTEIN: 7.4 g/dL (ref 6.1–8.1)
Total Bilirubin: 1 mg/dL (ref 0.2–1.2)

## 2017-01-04 LAB — URINALYSIS, MICROSCOPIC ONLY
CRYSTALS: NONE SEEN [HPF]
Yeast: NONE SEEN [HPF]

## 2017-01-04 LAB — URINALYSIS, ROUTINE W REFLEX MICROSCOPIC
BILIRUBIN URINE: NEGATIVE
Glucose, UA: NEGATIVE
Hgb urine dipstick: NEGATIVE
Ketones, ur: NEGATIVE
Nitrite: NEGATIVE
PH: 5.5 (ref 5.0–8.0)
SPECIFIC GRAVITY, URINE: 1.015 (ref 1.001–1.035)

## 2017-01-04 NOTE — Progress Notes (Signed)
   Subjective:    Patient ID: [name], female    DOB: 1951/01/12, 66 y.o.   MRN: [id number]  Patient presents for Follow-up (is fasting)  She had a follow-up chronic renal insufficiency. She ultrasound done which showed benign cyst on her lower right kidney otherwise kidney appeared normal she also has chronic hepatic steatosis which is not new. Her creatinine did increase to 1.54 with a BUN of 18 previous creatinine 7 months ago was 1.31 BUN of 15 Her current medications do include hydrochlorothiazide and irbesartan 75 mg once a day She is here today for repeat metabolic panel  Was not eating as much past couple for weeks, now eating better, cooking at home.   Reviewed labs and [place] with her   Review Of Systems:  GEN- denies fatigue, fever, weight loss,weakness, recent illness HEENT- denies eye drainage, change in vision, nasal discharge, CVS- denies chest pain, palpitations RESP- denies SOB, cough, wheeze ABD- denies N/V, change in stools, abd pain GU- denies dysuria, hematuria, dribbling, incontinence Neuro- denies headache, dizziness, syncope, seizure activity       Objective:    BP 124/66   Pulse 86   Temp 98.9 F (37.2 C) (Oral)   Resp 14   Ht 5' 7"  (1.702 m)   Wt 212 lb (96.2 kg)   SpO2 99%   BMI 33.20 kg/m  GEN- NAD, alert and oriented x3 CVS- RRR, no murmur RESP-CTAB EXT- No edema Pulses- Radial  2+        Assessment & Plan:      Problem List Items Addressed This Visit    CKD (chronic kidney disease), stage II - Primary    CKD check her renal function. No significant renal disease seen on ultrasound. Her appetite is proved and she is also better hydrated. If her creatinine does continue to trend up we'll get her set up with nephrology. We'll also check a urine microalbumin she is on ARB Her blood pressure is controlled      Relevant Orders   COMPLETE METABOLIC PANEL WITH GFR   Microalbumin / creatinine urine ratio      Note: This dictation  was prepared with Dragon dictation along with smaller phrase technology. Any transcriptional errors that result from this process are unintentional.

## 2017-01-04 NOTE — Assessment & Plan Note (Signed)
CKD check her renal function. No significant renal disease seen on ultrasound. Her appetite is proved and she is also better hydrated. If her creatinine does continue to trend up we'll get her set up with nephrology. We'll also check a urine microalbumin she is on ARB Her blood pressure is controlled

## 2017-01-04 NOTE — Addendum Note (Signed)
Addended by: [name] F on: 01/04/2017 11:06 AM   Modules accepted: Orders

## 2017-01-04 NOTE — Addendum Note (Signed)
Addended by: Sheral Flow on: 01/04/2017 09:52 AM   Modules accepted: Orders

## 2017-01-04 NOTE — Patient Instructions (Signed)
f/u 4 Months for Physical

## 2017-01-05 LAB — MICROALBUMIN / CREATININE URINE RATIO
Creatinine, Urine: 251 mg/dL (ref 20–320)
MICROALB UR: 16.6 mg/dL
MICROALB/CREAT RATIO: 66 ug/mg{creat} — AB (ref <30)

## 2017-01-12 ENCOUNTER — Other Ambulatory Visit: Payer: Self-pay | Admitting: *Deleted

## 2017-01-12 DIAGNOSIS — N182 Chronic kidney disease, stage 2 (mild): Secondary | ICD-10-CM

## 2017-02-02 ENCOUNTER — Encounter: Payer: Self-pay | Admitting: Gastroenterology

## 2017-03-17 ENCOUNTER — Encounter: Payer: Self-pay | Admitting: Family Medicine

## 2017-03-17 ENCOUNTER — Ambulatory Visit (INDEPENDENT_AMBULATORY_CARE_PROVIDER_SITE_OTHER): Admitting: Family Medicine

## 2017-03-17 VITALS — BP 118/62 | HR 90 | Temp 97.9°F | Resp 14 | Ht 67.0 in | Wt 209.0 lb

## 2017-03-17 DIAGNOSIS — N3090 Cystitis, unspecified without hematuria: Secondary | ICD-10-CM

## 2017-03-17 LAB — URINALYSIS, ROUTINE W REFLEX MICROSCOPIC
BILIRUBIN URINE: NEGATIVE
Glucose, UA: NEGATIVE
Hgb urine dipstick: NEGATIVE
Ketones, ur: NEGATIVE
NITRITE: NEGATIVE
PROTEIN: NEGATIVE
SPECIFIC GRAVITY, URINE: 1.015 (ref 1.001–1.035)
pH: 5 (ref 5.0–8.0)

## 2017-03-17 LAB — URINALYSIS, MICROSCOPIC ONLY
BACTERIA UA: NONE SEEN [HPF]
CRYSTALS: NONE SEEN [HPF]
Casts: NONE SEEN [LPF]
RBC / HPF: NONE SEEN RBC/HPF (ref <=2)
Yeast: NONE SEEN [HPF]

## 2017-03-17 MED ORDER — CIPROFLOXACIN HCL 500 MG PO TABS: 500.0000 mg | ORAL_TABLET | Freq: Two times a day (BID) | ORAL | 0 refills | Status: DC
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
## 2017-03-17 NOTE — Patient Instructions (Addendum)
Start cipro Referral to urology  Try the vagisil F/U as previous

## 2017-03-18 LAB — URINE CULTURE

## 2017-03-19 ENCOUNTER — Ambulatory Visit (INDEPENDENT_AMBULATORY_CARE_PROVIDER_SITE_OTHER): Admitting: Urology

## 2017-03-19 DIAGNOSIS — R300 Dysuria: Secondary | ICD-10-CM

## 2017-03-19 DIAGNOSIS — N952 Postmenopausal atrophic vaginitis: Secondary | ICD-10-CM | POA: Diagnosis not present

## 2017-03-22 ENCOUNTER — Telehealth: Payer: Self-pay | Admitting: Gastroenterology

## 2017-03-22 ENCOUNTER — Other Ambulatory Visit: Payer: Self-pay | Admitting: Family Medicine

## 2017-03-22 DIAGNOSIS — Z1231 Encounter for screening mammogram for malignant neoplasm of breast: Secondary | ICD-10-CM

## 2017-03-22 NOTE — Telephone Encounter (Signed)
Does she have any pain? Does she still have the [facility] cream? If so, use that 3-4 times per day.

## 2017-03-22 NOTE — Telephone Encounter (Signed)
Pt is back from her trip and called to make OV with SF ONLY. She is aware of OV for 8/29 at 230pm with SF. She wants to be seen sooner because of her hemorrhoids and she is having to wear a pad because of the bleeding. Please advise and call her at [phone]

## 2017-03-22 NOTE — Telephone Encounter (Signed)
I called pt and she said she is having problems with hemorrhoids. Most of the blood she sees is on tissue after BM. Her BM's are normal and daily. She takes 2 senna daily and miralax daily. She said Dr. Nona Dell is aware of all of the trouble she went through with the hemorrhoid surgery that made things worse. She will keep the appt on 06/02/2017 with Dr. Oneida Alar, but would like earlier appt if cancellations. Forwarding to Roseanne Kaufman, NP who is on hospital call for any recommendations prior to OV.

## 2017-03-22 NOTE — Telephone Encounter (Signed)
Noted  

## 2017-03-22 NOTE — Telephone Encounter (Signed)
PT is not having any pain. She has used the ointment some, but will try it 3-4 times a day. She is aware I will send the note to Dr. Oneida Alar and let her know if we have cancellations. She will call if anything worsens.

## 2017-03-31 ENCOUNTER — Ambulatory Visit (HOSPITAL_COMMUNITY)
Admission: RE | Admit: 2017-03-31 | Discharge: 2017-03-31 | Disposition: A | Discharge status: Home / Self Care | Source: Ambulatory Visit | Attending: Family Medicine | Admitting: Family Medicine

## 2017-03-31 DIAGNOSIS — Z1231 Encounter for screening mammogram for malignant neoplasm of breast: Secondary | ICD-10-CM

## 2017-04-01 NOTE — Telephone Encounter (Signed)
I REVIEWED HER CONCERNS. SHE SHOULD USE [facility] QID. SHE CAN CALL WITH QUESTIONS OR CONCERNS.

## 2017-04-08 ENCOUNTER — Telehealth: Payer: Self-pay | Admitting: *Deleted

## 2017-04-08 ENCOUNTER — Ambulatory Visit (HOSPITAL_COMMUNITY)
Admission: RE | Admit: 2017-04-08 | Discharge: 2017-04-08 | Disposition: A | Discharge status: Home / Self Care | Source: Ambulatory Visit | Attending: Family Medicine | Admitting: Family Medicine

## 2017-04-08 ENCOUNTER — Ambulatory Visit (INDEPENDENT_AMBULATORY_CARE_PROVIDER_SITE_OTHER): Admitting: Family Medicine

## 2017-04-08 ENCOUNTER — Encounter: Payer: Self-pay | Admitting: Family Medicine

## 2017-04-08 VITALS — BP 118/72 | HR 90 | Temp 98.7°F | Resp 14 | Ht 67.0 in | Wt 211.0 lb

## 2017-04-08 DIAGNOSIS — M25512 Pain in left shoulder: Secondary | ICD-10-CM

## 2017-04-08 DIAGNOSIS — M7989 Other specified soft tissue disorders: Secondary | ICD-10-CM | POA: Diagnosis not present

## 2017-04-08 DIAGNOSIS — M79602 Pain in left arm: Secondary | ICD-10-CM

## 2017-04-08 DIAGNOSIS — M19012 Primary osteoarthritis, left shoulder: Secondary | ICD-10-CM | POA: Insufficient documentation

## 2017-04-08 LAB — D-DIMER, QUANTITATIVE: D-Dimer, Quant: 0.41 mcg/mL FEU (ref <0.50)

## 2017-04-08 MED ORDER — OXYCODONE-ACETAMINOPHEN 5-325 MG PO TABS: 1.0000 | ORAL_TABLET | Freq: Four times a day (QID) | ORAL | 0 refills | Status: DC | PRN

## 2017-04-08 MED ORDER — IBUPROFEN 600 MG PO TABS: 600.0000 mg | ORAL_TABLET | Freq: Three times a day (TID) | ORAL | 0 refills | Status: DC | PRN

## 2017-04-08 NOTE — Telephone Encounter (Signed)
okay

## 2017-04-08 NOTE — Telephone Encounter (Signed)
Prescription sent to pharmacy.

## 2017-04-08 NOTE — Telephone Encounter (Signed)
Received call from patient.   Reports that prescription was sent to [facility], but it should have been sent to [facility] so that it will be delivered.   Ok to refill?

## 2017-04-08 NOTE — Progress Notes (Signed)
   Subjective:    Patient ID: [name], female    DOB: [birth year], 66 y.o.   MRN: [id number]  Patient presents for L Arm Pain (arm swollen and can't move it) and Achiness (states that she had achiness and HA whiel taking estrogen cream- has stopped taking cream and Sx improved, but did not resolve)   Pt here with left arm pain, from elbow to shoulder, about 10 days ago. States she woke up and arm was swollen. Used topical rub with no improvement in pain, has tried heat and ice, tried tylenol and ibuprofen with no improvement. Pain when she tries to raise it up. She is out of her oxycodone which she uses very sparingly. She denies any particular injury to the arm. She feels like the pain radiates from her shoulder all way down to her hand. States that she did stop taking her estrogen cream as she was concerned when arm was swollen and there was minimal improvement.    Review Of Systems:  GEN- denies fatigue, fever, weight loss,weakness, recent illness HEENT- denies eye drainage, change in vision, nasal discharge, CVS- denies chest pain, palpitations RESP- denies SOB, cough, wheeze ABD- denies N/V, change in stools, abd pain GU- denies dysuria, hematuria, dribbling, incontinence MSK- +joint pain, muscle aches, injury Neuro- denies headache, dizziness, syncope, seizure activity       Objective:    BP 118/72   Pulse 90   Temp 98.7 F (37.1 C) (Oral)   Resp 14   Ht 5' 7"  (1.702 m)   Wt 211 lb (95.7 kg)   SpO2 98%   BMI 33.05 kg/m  GEN- NAD, alert and oriented x3 HEENT- PERRL, EOMI, non injected sclera, pink conjunctiva, MMM, oropharynx clear Neck- Supple,good ROM UE- Normal inspection bilat, equal circumference 14cm bilat upper arm/bicep, TTP along entire arm, decreased ROM, severe pain with elevation/ rotation, upper ext, no muscle bulge or gross abnormality, pain with empty can, holding arm mostly against her chest, FROM Right upper ext  Pulse- radial 2+        Assessment & Plan:      Problem List Items Addressed This Visit    None    Visit Diagnoses    Acute pain of left shoulder    -  Primary   This appears to be more muscle skeletal pain but her pain is out of proportion to the exam. No appreciable swelling on exam today.  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 Will refill her pain med, she tolerates ibuprofen so will take 643m BID with food Send back to her orthopedic surgeon   Relevant Orders   Ambulatory referral to Orthopedic Surgery   DG Shoulder Left (Completed)   DG Humerus Left (Completed)   Left arm pain       Relevant Orders   Ambulatory referral to Orthopedic Surgery   DG Shoulder Left (Completed)   DG Humerus Left (Completed)   [facility] ([facility]   Arm swelling       Relevant Orders   [facility] ([facility])      Note: This dictation was prepared with Dragon dictation along with smaller phrase technology. Any transcriptional errors that result from this process are unintentional.

## 2017-04-08 NOTE — Patient Instructions (Signed)
Referral to orthopedics  We will call with D dimer  F/U as needed

## 2017-04-14 ENCOUNTER — Ambulatory Visit: Admitting: Orthopaedic Surgery

## 2017-04-15 ENCOUNTER — Encounter: Payer: Self-pay | Admitting: Orthopaedic Surgery

## 2017-04-15 ENCOUNTER — Telehealth: Payer: Self-pay | Admitting: Orthopaedic Surgery

## 2017-04-15 ENCOUNTER — Ambulatory Visit (INDEPENDENT_AMBULATORY_CARE_PROVIDER_SITE_OTHER)

## 2017-04-15 ENCOUNTER — Ambulatory Visit (INDEPENDENT_AMBULATORY_CARE_PROVIDER_SITE_OTHER): Admitting: Orthopaedic Surgery

## 2017-04-15 VITALS — BP 129/80 | HR 93 | Temp 98.3°F | Ht 67.0 in | Wt 211.0 lb

## 2017-04-15 DIAGNOSIS — M25512 Pain in left shoulder: Secondary | ICD-10-CM | POA: Diagnosis not present

## 2017-04-15 DIAGNOSIS — M542 Cervicalgia: Secondary | ICD-10-CM | POA: Diagnosis not present

## 2017-04-15 MED ORDER — NAPROXEN 500 MG PO TABS: 500.0000 mg | ORAL_TABLET | Freq: Two times a day (BID) | ORAL | 5 refills | Status: DC
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
## 2017-04-15 NOTE — Addendum Note (Signed)
Addended by: Willette Pa on: 04/15/2017 04:11 PM   Modules accepted: Orders

## 2017-04-15 NOTE — Telephone Encounter (Signed)
Because it was listed for [place].  I have changed.

## 2017-04-15 NOTE — Telephone Encounter (Signed)
Patient states that [name] did not get prescription that you were supposed to send this morning.

## 2017-04-19 ENCOUNTER — Ambulatory Visit (HOSPITAL_COMMUNITY): Attending: Orthopaedic Surgery

## 2017-04-19 ENCOUNTER — Encounter (HOSPITAL_COMMUNITY): Payer: Self-pay

## 2017-04-19 DIAGNOSIS — M542 Cervicalgia: Secondary | ICD-10-CM | POA: Diagnosis present

## 2017-04-19 DIAGNOSIS — R29898 Other symptoms and signs involving the musculoskeletal system: Secondary | ICD-10-CM | POA: Diagnosis present

## 2017-04-19 DIAGNOSIS — M25512 Pain in left shoulder: Secondary | ICD-10-CM | POA: Insufficient documentation

## 2017-04-19 DIAGNOSIS — G8929 Other chronic pain: Secondary | ICD-10-CM | POA: Diagnosis present

## 2017-04-19 DIAGNOSIS — M25612 Stiffness of left shoulder, not elsewhere classified: Secondary | ICD-10-CM | POA: Diagnosis present
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
## 2017-04-19 NOTE — Patient Instructions (Addendum)
SHOULDER: Flexion On Table   Place hands on table, elbows straight. Move hips away from body. Press hands down into table. Hold ___ seconds. ___ reps per set, ___ sets per day, ___ days per week  Abduction (Passive)   With arm out to side, resting on table, lower head toward arm, keeping trunk away from table. Hold ____ seconds. Repeat ____ times. Do ____ sessions per day.  Copyright  VHI. All rights reserved.     Internal Rotation (Assistive)   Seated with elbow bent at right angle and held against side, slide arm on table surface in an inward arc. Repeat ____ times. Do ____ sessions per day. Activity: Use this motion to brush crumbs off the table.  Copyright  VHI. All rights reserved.      Seated Row   Sit up straight with elbows by your sides. Pull back with shoulders/elbows, keeping forearms straight, as if pulling back on the reins of a horse. Squeeze shoulder blades together. Repeat ___times, ____sets/day      Shoulder Extension    Sit up straight with both arms by your side, draw your arms back behind your waist. Keep your elbows straight. Repeat ____times, ____sets/day.

## 2017-04-21 ENCOUNTER — Ambulatory Visit (HOSPITAL_COMMUNITY): Admitting: Specialist

## 2017-04-21 ENCOUNTER — Encounter (HOSPITAL_COMMUNITY): Payer: Self-pay

## 2017-04-23 ENCOUNTER — Encounter (HOSPITAL_COMMUNITY): Payer: Self-pay

## 2017-04-23 ENCOUNTER — Ambulatory Visit (HOSPITAL_COMMUNITY)

## 2017-04-23 DIAGNOSIS — G8929 Other chronic pain: Secondary | ICD-10-CM

## 2017-04-23 DIAGNOSIS — M25512 Pain in left shoulder: Secondary | ICD-10-CM

## 2017-04-23 DIAGNOSIS — M25612 Stiffness of left shoulder, not elsewhere classified: Secondary | ICD-10-CM

## 2017-04-23 DIAGNOSIS — R29898 Other symptoms and signs involving the musculoskeletal system: Secondary | ICD-10-CM

## 2017-04-23 DIAGNOSIS — M542 Cervicalgia: Secondary | ICD-10-CM | POA: Diagnosis not present
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
## 2017-04-23 NOTE — Patient Instructions (Signed)
Repeat all exercises 10-15 times, 1-2 times per day.  1) Shoulder Protraction    Begin with elbows by your side, slowly "punch" straight out in front of you keeping arms/elbows straight.      2) Shoulder Flexion  Supine:            Begin with arms at your side with thumbs pointed up, slowly raise both arms up and forward towards overhead.      3) Horizontal abduction/adduction  Supine:          Begin with arms straight out in front of you, bring out to the side in at "T" shape. Keep arms straight entire time.       4) Internal & External Rotation    *No band* -Stand with elbows at the side and elbows bent 90 degrees. Move your forearms away from your body, then bring back inward toward the body.     5) Shoulder Abduction  Supine:           Lying on your back begin with your arms flat on the table next to your side. Slowly move your arms out to the side so that they go overhead, in a jumping jack or snow angel movement.

## 2017-04-28 ENCOUNTER — Telehealth (HOSPITAL_COMMUNITY): Payer: Self-pay | Admitting: Family Medicine

## 2017-04-28 ENCOUNTER — Ambulatory Visit (HOSPITAL_COMMUNITY)

## 2017-04-28 NOTE — Telephone Encounter (Signed)
04/28/17  Pt left a message to cx appt today because she has been sick all night

## 2017-04-29 ENCOUNTER — Ambulatory Visit (INDEPENDENT_AMBULATORY_CARE_PROVIDER_SITE_OTHER): Admitting: Orthopaedic Surgery

## 2017-04-29 ENCOUNTER — Encounter: Payer: Self-pay | Admitting: Orthopaedic Surgery

## 2017-04-29 VITALS — BP 126/79 | HR 91 | Temp 97.3°F | Ht 67.0 in | Wt 208.0 lb

## 2017-04-29 DIAGNOSIS — M25512 Pain in left shoulder: Secondary | ICD-10-CM

## 2017-04-29 DIAGNOSIS — M542 Cervicalgia: Secondary | ICD-10-CM
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
## 2017-04-30 ENCOUNTER — Ambulatory Visit (HOSPITAL_COMMUNITY)

## 2017-04-30 ENCOUNTER — Other Ambulatory Visit (HOSPITAL_COMMUNITY): Payer: Self-pay | Admitting: Nephrology

## 2017-04-30 ENCOUNTER — Encounter (HOSPITAL_COMMUNITY): Payer: Self-pay

## 2017-04-30 DIAGNOSIS — M542 Cervicalgia: Secondary | ICD-10-CM | POA: Diagnosis not present

## 2017-04-30 DIAGNOSIS — N183 Chronic kidney disease, stage 3 unspecified: Secondary | ICD-10-CM

## 2017-04-30 DIAGNOSIS — M25612 Stiffness of left shoulder, not elsewhere classified: Secondary | ICD-10-CM

## 2017-04-30 DIAGNOSIS — G8929 Other chronic pain: Secondary | ICD-10-CM

## 2017-04-30 DIAGNOSIS — R29898 Other symptoms and signs involving the musculoskeletal system: Secondary | ICD-10-CM

## 2017-04-30 DIAGNOSIS — M25512 Pain in left shoulder: Secondary | ICD-10-CM

## 2017-04-30 NOTE — Patient Instructions (Signed)
(  Home) Extension: Isometric / Bilateral Arm Retraction - Sitting   Facing anchor, hold hands and elbow at shoulder height, with elbow bent.  Pull arms back to squeeze shoulder blades together. Repeat 10-15 times. 1-3 times/day.   Copyright  VHI. All rights reserved.   (Home) Retraction: Row - Bilateral (Anchor)   Facing anchor, arms reaching forward, pull hands toward stomach, keeping elbows bent and at your sides and pinching shoulder blades together. Repeat 10-15 times. 1-3 times/day.   Copyright  VHI. All rights reserved.   (Clinic) Extension / Flexion (Assist)   Face anchor, pull arms back, keeping elbow straight, and squeze shoulder blades together. Repeat 10-15 times. 1-3 times/day.   Copyright  VHI. All rights reserved.
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
## 2017-05-06 ENCOUNTER — Ambulatory Visit (HOSPITAL_COMMUNITY): Attending: Orthopaedic Surgery

## 2017-05-06 ENCOUNTER — Encounter (HOSPITAL_COMMUNITY): Payer: Self-pay

## 2017-05-06 DIAGNOSIS — M25512 Pain in left shoulder: Secondary | ICD-10-CM | POA: Diagnosis present

## 2017-05-06 DIAGNOSIS — M542 Cervicalgia: Secondary | ICD-10-CM

## 2017-05-06 DIAGNOSIS — M25612 Stiffness of left shoulder, not elsewhere classified: Secondary | ICD-10-CM

## 2017-05-06 DIAGNOSIS — G8929 Other chronic pain: Secondary | ICD-10-CM | POA: Diagnosis present

## 2017-05-06 DIAGNOSIS — R29898 Other symptoms and signs involving the musculoskeletal system: Secondary | ICD-10-CM
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
## 2017-05-07 ENCOUNTER — Ambulatory Visit (INDEPENDENT_AMBULATORY_CARE_PROVIDER_SITE_OTHER): Admitting: Family Medicine

## 2017-05-07 ENCOUNTER — Encounter: Payer: Self-pay | Admitting: Family Medicine

## 2017-05-07 VITALS — BP 124/80 | HR 74 | Temp 98.8°F | Resp 18 | Ht 67.0 in | Wt 208.0 lb

## 2017-05-07 DIAGNOSIS — Z114 Encounter for screening for human immunodeficiency virus [HIV]: Secondary | ICD-10-CM

## 2017-05-07 DIAGNOSIS — N182 Chronic kidney disease, stage 2 (mild): Secondary | ICD-10-CM | POA: Diagnosis not present

## 2017-05-07 DIAGNOSIS — Z0000 Encounter for general adult medical examination without abnormal findings: Secondary | ICD-10-CM

## 2017-05-07 DIAGNOSIS — E782 Mixed hyperlipidemia: Secondary | ICD-10-CM

## 2017-05-07 DIAGNOSIS — K7581 Nonalcoholic steatohepatitis (NASH): Secondary | ICD-10-CM | POA: Diagnosis not present

## 2017-05-07 DIAGNOSIS — I10 Essential (primary) hypertension: Secondary | ICD-10-CM

## 2017-05-07 DIAGNOSIS — F419 Anxiety disorder, unspecified: Secondary | ICD-10-CM

## 2017-05-07 DIAGNOSIS — Z1159 Encounter for screening for other viral diseases: Secondary | ICD-10-CM

## 2017-05-07 DIAGNOSIS — Z1321 Encounter for screening for nutritional disorder: Secondary | ICD-10-CM

## 2017-05-07 DIAGNOSIS — I129 Hypertensive chronic kidney disease with stage 1 through stage 4 chronic kidney disease, or unspecified chronic kidney disease: Secondary | ICD-10-CM | POA: Diagnosis not present

## 2017-05-07 MED ORDER — IRBESARTAN 150 MG PO TABS: 75.0000 mg | ORAL_TABLET | Freq: Every day | ORAL | 3 refills | Status: DC

## 2017-05-07 MED ORDER — ALPRAZOLAM 1 MG PO TABS: 1.0000 mg | ORAL_TABLET | Freq: Two times a day (BID) | ORAL | 0 refills | Status: DC | PRN

## 2017-05-07 MED ORDER — HYDROCHLOROTHIAZIDE 12.5 MG PO TABS: 12.5000 mg | ORAL_TABLET | Freq: Every day | ORAL | 3 refills | Status: DC

## 2017-05-07 NOTE — Patient Instructions (Addendum)
Restart the HCTZ 12.76m  Continue the irbesartan  We will call with lab results  F/U Jan 2019

## 2017-05-07 NOTE — Progress Notes (Signed)
   Subjective:    Patient ID: [name], female    DOB: [birth year], 66 y.o.   MRN: [id number]  Patient presents for Annual Exam  CKD- seen by nephrology , has Ultrasound 8/15 and 24 hour urine collection , they believe she is on HCTZ she has been off for a couple of months, no swelling - one visit was feeling fatigued BP was low, so it was held  Dr. Luna Glasgow- for shoulder /neck pain, currently in Physical therapy, worse since PT last week    Using TENS unit   Medications and history reviewed  Going on Trip-in December to [place]- UTD Colonoscopy-UTD  Immunizations- UTD  Discussed Hepatitis C screening and HIV   Review Of Systems:  GEN- denies fatigue, fever, weight loss,weakness, recent illness HEENT- denies eye drainage, change in vision, nasal discharge, CVS- denies chest pain, palpitations RESP- denies SOB, cough, wheeze ABD- denies N/V, change in stools, abd pain GU- denies dysuria, hematuria, dribbling, incontinence MSK- +joint pain, muscle aches, injury Neuro- denies headache, dizziness, syncope, seizure activity       Objective:    BP 124/80   Pulse 74   Temp 98.8 F (37.1 C)   Resp 18   Ht 5' 7"  (1.702 m)   Wt 208 lb (94.3 kg)   SpO2 97%   BMI 32.58 kg/m  GEN- NAD, alert and oriented x3 HEENT- PERRL, EOMI, non injected sclera, pink conjunctiva, MMM, oropharynx clear Neck- Supple, no thyromegaly CVS- RRR, no murmur RESP-CTAB ABD-NABS,soft,NT,ND Psych- normal affect and mood EXT- No edema Pulses- Radial, DP- 2+        Assessment & Plan:      Problem List Items Addressed This Visit    Essential hypertension, benign   Relevant Medications   hydrochlorothiazide (HYDRODIURIL) 12.5 MG tablet   irbesartan (AVAPRO) 150 MG tablet   Hyperlipidemia   Relevant Medications   hydrochlorothiazide (HYDRODIURIL) 12.5 MG tablet   irbesartan (AVAPRO) 150 MG tablet   Other Relevant Orders   Lipid panel (Completed)   Nonalcoholic  steatohepatitis (NASH)    LFT elevated but fairly stable      Relevant Orders   Lipid panel (Completed)   CKD (chronic kidney disease), stage II    BP controlled, restart HCTZ 12.31m She is to alert nephrologist of this      Anxiety    Continue xanax      Relevant Medications   ALPRAZolam (XANAX) 1 MG tablet    Other Visit Diagnoses    Routine general medical examination at a health care facility    -  Primary   CPE done, prevention UTD, no changes    Relevant Orders   Vitamin D, 25-hydroxy (Completed)   Need for hepatitis C screening test       Relevant Orders   Hepatitis C antibody (Completed)   Encounter for screening for HIV       Relevant Orders   HIV antibody (Completed)   Encounter for vitamin deficiency screening       Relevant Orders   Vitamin D, 25-hydroxy (Completed)      Note: This dictation was prepared with Dragon dictation along with smaller phrase technology. Any transcriptional errors that result from this process are unintentional.

## 2017-05-08 LAB — VITAMIN D 25 HYDROXY (VIT D DEFICIENCY, FRACTURES): VIT D 25 HYDROXY: 10 ng/mL — AB (ref 30–100)

## 2017-05-08 LAB — LIPID PANEL
Cholesterol: 187 mg/dL (ref <200)
HDL: 32 mg/dL — ABNORMAL LOW (ref >50)
LDL CALC: 113 mg/dL — AB (ref <100)
Total CHOL/HDL Ratio: 5.8 Ratio — ABNORMAL HIGH (ref <5.0)
Triglycerides: 210 mg/dL — ABNORMAL HIGH (ref <150)
VLDL: 42 mg/dL — AB (ref <30)

## 2017-05-08 LAB — HIV ANTIBODY (ROUTINE TESTING W REFLEX): HIV 1&2 Ab, 4th Generation: NONREACTIVE

## 2017-05-08 LAB — HEPATITIS C ANTIBODY: HCV AB: NONREACTIVE

## 2017-05-09 NOTE — Assessment & Plan Note (Signed)
BP controlled, restart HCTZ 12.5m She is to alert nephrologist of this

## 2017-05-09 NOTE — Assessment & Plan Note (Signed)
Continue xanax

## 2017-05-09 NOTE — Assessment & Plan Note (Signed)
LFT elevated but fairly stable

## 2017-05-12 ENCOUNTER — Ambulatory Visit (HOSPITAL_COMMUNITY)

## 2017-05-12 ENCOUNTER — Encounter (HOSPITAL_COMMUNITY): Payer: Self-pay

## 2017-05-12 DIAGNOSIS — G8929 Other chronic pain: Secondary | ICD-10-CM

## 2017-05-12 DIAGNOSIS — R29898 Other symptoms and signs involving the musculoskeletal system: Secondary | ICD-10-CM

## 2017-05-12 DIAGNOSIS — M542 Cervicalgia: Secondary | ICD-10-CM

## 2017-05-12 DIAGNOSIS — M25512 Pain in left shoulder: Secondary | ICD-10-CM

## 2017-05-12 DIAGNOSIS — M25612 Stiffness of left shoulder, not elsewhere classified: Secondary | ICD-10-CM
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
## 2017-05-13 ENCOUNTER — Other Ambulatory Visit: Payer: Self-pay | Admitting: *Deleted

## 2017-05-13 ENCOUNTER — Encounter: Payer: Self-pay | Admitting: *Deleted

## 2017-05-13 MED ORDER — VITAMIN D (ERGOCALCIFEROL) 1.25 MG (50000 UNIT) PO CAPS: 50000.0000 [IU] | ORAL_CAPSULE | ORAL | 0 refills | Status: DC

## 2017-05-14 ENCOUNTER — Ambulatory Visit (HOSPITAL_COMMUNITY)

## 2017-05-14 ENCOUNTER — Encounter (HOSPITAL_COMMUNITY): Payer: Self-pay

## 2017-05-14 DIAGNOSIS — R29898 Other symptoms and signs involving the musculoskeletal system: Secondary | ICD-10-CM

## 2017-05-14 DIAGNOSIS — G8929 Other chronic pain: Secondary | ICD-10-CM

## 2017-05-14 DIAGNOSIS — M542 Cervicalgia: Secondary | ICD-10-CM

## 2017-05-14 DIAGNOSIS — M25612 Stiffness of left shoulder, not elsewhere classified: Secondary | ICD-10-CM

## 2017-05-14 DIAGNOSIS — M25512 Pain in left shoulder: Secondary | ICD-10-CM

## 2017-05-14 NOTE — Patient Instructions (Signed)
Perform each exercise ____1-12____ reps. 2-3x days.   Protraction - STANDING  Start by holding a wand or cane at chest height.  Next, slowly push the wand outwards in front of your body so that your elbows become fully straightened. Then, return to the original position.     Shoulder FLEXION - STANDING - PALMS DOWN  In the standing position, hold a wand/cane with both arms, palms down on both sides. Raise up the wand/cane allowing your unaffected arm to perform most of the effort. Your affected arm should be partially relaxed.      Internal/External ROTATION - STANDING  In the standing position, hold a wand/cane with both hands keeping your elbows bent. Move your arms and wand/cane to one side.  Your affected arm should be partially relaxed while your unaffected arm performs most of the effort.       Shoulder ABDUCTION - STANDING  While holding a wand/cane palm face up on the injured side and palm face down on the uninjured side, slowly raise up your injured arm to the side.          Horizontal Abduction/Adduction      Straight arms holding cane at shoulder height, bring cane to right, center, left. Repeat starting to left.   Copyright  VHI. All rights reserved.
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
## 2017-05-17 ENCOUNTER — Ambulatory Visit (HOSPITAL_COMMUNITY)

## 2017-05-17 ENCOUNTER — Encounter (HOSPITAL_COMMUNITY): Payer: Self-pay

## 2017-05-17 DIAGNOSIS — M25612 Stiffness of left shoulder, not elsewhere classified: Secondary | ICD-10-CM

## 2017-05-17 DIAGNOSIS — R29898 Other symptoms and signs involving the musculoskeletal system: Secondary | ICD-10-CM

## 2017-05-17 DIAGNOSIS — M542 Cervicalgia: Secondary | ICD-10-CM | POA: Diagnosis not present

## 2017-05-17 DIAGNOSIS — G8929 Other chronic pain: Secondary | ICD-10-CM

## 2017-05-17 DIAGNOSIS — M25512 Pain in left shoulder: Secondary | ICD-10-CM
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
## 2017-05-19 ENCOUNTER — Ambulatory Visit (HOSPITAL_COMMUNITY)
Admission: RE | Admit: 2017-05-19 | Discharge: 2017-05-19 | Disposition: A | Discharge status: Home / Self Care | Source: Ambulatory Visit | Attending: Nephrology | Admitting: Nephrology

## 2017-05-19 DIAGNOSIS — N183 Chronic kidney disease, stage 3 unspecified: Secondary | ICD-10-CM

## 2017-05-20 ENCOUNTER — Ambulatory Visit (HOSPITAL_COMMUNITY)

## 2017-05-20 DIAGNOSIS — M542 Cervicalgia: Secondary | ICD-10-CM

## 2017-05-20 DIAGNOSIS — M25512 Pain in left shoulder: Secondary | ICD-10-CM

## 2017-05-20 DIAGNOSIS — G8929 Other chronic pain: Secondary | ICD-10-CM

## 2017-05-20 DIAGNOSIS — M25612 Stiffness of left shoulder, not elsewhere classified: Secondary | ICD-10-CM

## 2017-05-20 DIAGNOSIS — R29898 Other symptoms and signs involving the musculoskeletal system: Secondary | ICD-10-CM
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
## 2017-05-24 ENCOUNTER — Telehealth (HOSPITAL_COMMUNITY): Payer: Self-pay | Admitting: Family Medicine

## 2017-05-24 ENCOUNTER — Encounter (HOSPITAL_COMMUNITY): Payer: Self-pay

## 2017-05-24 ENCOUNTER — Telehealth (HOSPITAL_COMMUNITY): Payer: Self-pay

## 2017-05-24 ENCOUNTER — Ambulatory Visit (HOSPITAL_COMMUNITY)

## 2017-05-24 DIAGNOSIS — G8929 Other chronic pain: Secondary | ICD-10-CM

## 2017-05-24 DIAGNOSIS — M25612 Stiffness of left shoulder, not elsewhere classified: Secondary | ICD-10-CM

## 2017-05-24 DIAGNOSIS — M25512 Pain in left shoulder: Secondary | ICD-10-CM

## 2017-05-24 DIAGNOSIS — M542 Cervicalgia: Secondary | ICD-10-CM | POA: Diagnosis not present

## 2017-05-24 DIAGNOSIS — R29898 Other symptoms and signs involving the musculoskeletal system: Secondary | ICD-10-CM

## 2017-05-24 NOTE — Telephone Encounter (Signed)
05/24/17 pt called to say that her 8/23 appointment can stay like it is.... She said that her drs appt time changed and now everything will work out.

## 2017-05-24 NOTE — Telephone Encounter (Signed)
Pt needs 9:45 or later in the morning she has 8:10 with [place] and hires a taxi to get here
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
## 2017-05-26 ENCOUNTER — Telehealth: Payer: Self-pay | Admitting: *Deleted

## 2017-05-26 NOTE — Telephone Encounter (Signed)
Received call from patient.   Inquired as to if VItamin D should be taken with food. Advised that it can be taken at any time, but if nausea occurs it is best to take with food.

## 2017-05-27 ENCOUNTER — Encounter (HOSPITAL_COMMUNITY): Payer: Self-pay

## 2017-05-27 ENCOUNTER — Ambulatory Visit (INDEPENDENT_AMBULATORY_CARE_PROVIDER_SITE_OTHER): Admitting: Orthopaedic Surgery

## 2017-05-27 ENCOUNTER — Encounter: Payer: Self-pay | Admitting: Orthopaedic Surgery

## 2017-05-27 ENCOUNTER — Ambulatory Visit (HOSPITAL_COMMUNITY)

## 2017-05-27 VITALS — BP 123/82 | HR 82 | Temp 97.8°F | Ht 67.0 in | Wt 212.0 lb

## 2017-05-27 DIAGNOSIS — M25512 Pain in left shoulder: Secondary | ICD-10-CM

## 2017-05-27 DIAGNOSIS — G8929 Other chronic pain: Secondary | ICD-10-CM | POA: Diagnosis not present

## 2017-05-27 DIAGNOSIS — R29898 Other symptoms and signs involving the musculoskeletal system: Secondary | ICD-10-CM

## 2017-05-27 DIAGNOSIS — M542 Cervicalgia: Secondary | ICD-10-CM | POA: Diagnosis not present

## 2017-05-27 DIAGNOSIS — M25612 Stiffness of left shoulder, not elsewhere classified: Secondary | ICD-10-CM
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
## 2017-05-31 ENCOUNTER — Ambulatory Visit (HOSPITAL_COMMUNITY)

## 2017-05-31 ENCOUNTER — Encounter (HOSPITAL_COMMUNITY): Payer: Self-pay

## 2017-05-31 DIAGNOSIS — M542 Cervicalgia: Secondary | ICD-10-CM | POA: Diagnosis not present

## 2017-05-31 DIAGNOSIS — M25512 Pain in left shoulder: Secondary | ICD-10-CM

## 2017-05-31 DIAGNOSIS — M25612 Stiffness of left shoulder, not elsewhere classified: Secondary | ICD-10-CM

## 2017-05-31 DIAGNOSIS — R29898 Other symptoms and signs involving the musculoskeletal system: Secondary | ICD-10-CM

## 2017-05-31 DIAGNOSIS — G8929 Other chronic pain: Secondary | ICD-10-CM
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
## 2017-06-02 ENCOUNTER — Encounter: Payer: Self-pay | Admitting: Gastroenterology

## 2017-06-02 ENCOUNTER — Ambulatory Visit (INDEPENDENT_AMBULATORY_CARE_PROVIDER_SITE_OTHER): Admitting: Gastroenterology

## 2017-06-02 DIAGNOSIS — K5901 Slow transit constipation: Secondary | ICD-10-CM

## 2017-06-02 DIAGNOSIS — K601 Chronic anal fissure: Secondary | ICD-10-CM

## 2017-06-02 DIAGNOSIS — K219 Gastro-esophageal reflux disease without esophagitis: Secondary | ICD-10-CM

## 2017-06-02 NOTE — Progress Notes (Signed)
CC'ED TO PCP 

## 2017-06-02 NOTE — Assessment & Plan Note (Signed)
SYMPTOMS FAIRLY WELL CONTROLLED.  CONTINUE TO MONITOR SYMPTOMS. 

## 2017-06-02 NOTE — Assessment & Plan Note (Signed)
SYMPTOMS FAIRLY WELL CONTROLLED.  DRINK WATER TO KEEP YOUR URINE LIGHT YELLOW. Eat fiber as tolerated. Use lidocaine/hydrocortisone to relive rectal pain. CONTINUE NITROGLYCERIN OINTMENT.  FOLLOW UP IN 6 MOS.

## 2017-06-02 NOTE — Addendum Note (Signed)
Addended by: Danie Binder on: 06/02/2017 03:09 PM   Modules accepted: Orders
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
## 2017-06-02 NOTE — Patient Instructions (Addendum)
DRINK WATER TO KEEP YOUR URINE LIGHT YELLOW.  Eat fiber as tolerated.  Use lidocaine/hydrocortisone to relive rectal pain.  CONTINUE NITROGLYCERIN OINTMENT.  FOLLOW UP IN 6 MOS.

## 2017-06-02 NOTE — Assessment & Plan Note (Signed)
SYMPTOMS CONTROLLED/RESOLVED.  CONTINUE DEXILANT CONTINUE TO MONITOR SYMPTOMS.

## 2017-06-03 ENCOUNTER — Ambulatory Visit (HOSPITAL_COMMUNITY)
Admission: RE | Admit: 2017-06-03 | Discharge: 2017-06-03 | Disposition: A | Discharge status: Home / Self Care | Source: Ambulatory Visit | Attending: Orthopaedic Surgery | Admitting: Orthopaedic Surgery

## 2017-06-03 ENCOUNTER — Ambulatory Visit (HOSPITAL_COMMUNITY)

## 2017-06-03 ENCOUNTER — Encounter (HOSPITAL_COMMUNITY): Payer: Self-pay

## 2017-06-03 DIAGNOSIS — M75102 Unspecified rotator cuff tear or rupture of left shoulder, not specified as traumatic: Secondary | ICD-10-CM | POA: Diagnosis not present

## 2017-06-03 DIAGNOSIS — G8929 Other chronic pain: Secondary | ICD-10-CM | POA: Insufficient documentation

## 2017-06-03 DIAGNOSIS — R29898 Other symptoms and signs involving the musculoskeletal system: Secondary | ICD-10-CM

## 2017-06-03 DIAGNOSIS — M25512 Pain in left shoulder: Secondary | ICD-10-CM

## 2017-06-03 DIAGNOSIS — M19012 Primary osteoarthritis, left shoulder: Secondary | ICD-10-CM | POA: Insufficient documentation

## 2017-06-03 DIAGNOSIS — M542 Cervicalgia: Secondary | ICD-10-CM | POA: Diagnosis not present

## 2017-06-03 DIAGNOSIS — M25612 Stiffness of left shoulder, not elsewhere classified: Secondary | ICD-10-CM
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
## 2017-06-03 NOTE — Progress Notes (Signed)
ON RECALL  °

## 2017-06-09 ENCOUNTER — Encounter: Payer: Self-pay | Admitting: Orthopaedic Surgery

## 2017-06-09 ENCOUNTER — Ambulatory Visit (INDEPENDENT_AMBULATORY_CARE_PROVIDER_SITE_OTHER): Admitting: Orthopaedic Surgery

## 2017-06-09 ENCOUNTER — Ambulatory Visit (HOSPITAL_COMMUNITY): Attending: Orthopaedic Surgery

## 2017-06-09 VITALS — BP 111/72 | HR 86 | Temp 97.8°F | Ht 67.0 in | Wt 214.0 lb

## 2017-06-09 DIAGNOSIS — G8929 Other chronic pain: Secondary | ICD-10-CM

## 2017-06-09 DIAGNOSIS — R29898 Other symptoms and signs involving the musculoskeletal system: Secondary | ICD-10-CM | POA: Diagnosis present

## 2017-06-09 DIAGNOSIS — M542 Cervicalgia: Secondary | ICD-10-CM

## 2017-06-09 DIAGNOSIS — M25612 Stiffness of left shoulder, not elsewhere classified: Secondary | ICD-10-CM

## 2017-06-09 DIAGNOSIS — M25512 Pain in left shoulder: Secondary | ICD-10-CM
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
## 2017-06-11 ENCOUNTER — Encounter (HOSPITAL_COMMUNITY): Payer: Self-pay

## 2017-06-11 ENCOUNTER — Ambulatory Visit (HOSPITAL_COMMUNITY)

## 2017-06-11 DIAGNOSIS — R29898 Other symptoms and signs involving the musculoskeletal system: Secondary | ICD-10-CM

## 2017-06-11 DIAGNOSIS — M25512 Pain in left shoulder: Secondary | ICD-10-CM

## 2017-06-11 DIAGNOSIS — M25612 Stiffness of left shoulder, not elsewhere classified: Secondary | ICD-10-CM

## 2017-06-11 DIAGNOSIS — G8929 Other chronic pain: Secondary | ICD-10-CM

## 2017-06-11 DIAGNOSIS — M542 Cervicalgia: Secondary | ICD-10-CM | POA: Diagnosis not present
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
## 2017-06-15 ENCOUNTER — Telehealth (HOSPITAL_COMMUNITY): Payer: Self-pay | Admitting: Family Medicine

## 2017-06-15 ENCOUNTER — Ambulatory Visit (HOSPITAL_COMMUNITY)

## 2017-06-15 DIAGNOSIS — M542 Cervicalgia: Secondary | ICD-10-CM | POA: Diagnosis not present

## 2017-06-15 DIAGNOSIS — M25612 Stiffness of left shoulder, not elsewhere classified: Secondary | ICD-10-CM

## 2017-06-15 DIAGNOSIS — R29898 Other symptoms and signs involving the musculoskeletal system: Secondary | ICD-10-CM

## 2017-06-15 DIAGNOSIS — M25512 Pain in left shoulder: Secondary | ICD-10-CM

## 2017-06-15 DIAGNOSIS — G8929 Other chronic pain: Secondary | ICD-10-CM

## 2017-06-15 NOTE — Telephone Encounter (Signed)
06/15/17  Pt called and said that she had talked with [employer] and was told that she shouldn't be paying anything while coming to therapy.  I explained to her what we verified prior to her first visit and how we came up with the $25 due.  She also asked about the bililng process and I gave her the phone number to billing so she could talk to someone there and answer her questions.
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
## 2017-06-17 ENCOUNTER — Telehealth (HOSPITAL_COMMUNITY): Payer: Self-pay

## 2017-06-17 NOTE — Telephone Encounter (Signed)
No transportation

## 2017-06-18 ENCOUNTER — Ambulatory Visit (HOSPITAL_COMMUNITY)

## 2017-06-21 ENCOUNTER — Telehealth (HOSPITAL_COMMUNITY): Payer: Self-pay

## 2017-06-21 NOTE — Telephone Encounter (Signed)
[facility] [employer] cx on this pateint.

## 2017-06-22 ENCOUNTER — Telehealth: Payer: Self-pay | Admitting: Gastroenterology

## 2017-06-22 ENCOUNTER — Ambulatory Visit (HOSPITAL_COMMUNITY)

## 2017-06-22 ENCOUNTER — Telehealth: Payer: Self-pay | Admitting: Family Medicine

## 2017-06-22 NOTE — Telephone Encounter (Signed)
I dont see what this issue is. It was time for her physical it was scheduled back in April for August Preventative care as well as her other issues were addressed. Physical is allowed once a year, I didn't event charge her for the other portions not associated with the physical. What is the issue?

## 2017-06-22 NOTE — Telephone Encounter (Signed)
Patient called in states she can't have a routine physical.  She states that her insurance company won't pay for it. She states that if she had known that on 05/11/2017 it was going to be coded as a routine physical she would have told Dr. Buelah Manis. She states that she doesn't feel like it was a routine physical since you did all kinds of test. Since she was fatigue and different things.  CB# [phone]

## 2017-06-22 NOTE — Telephone Encounter (Signed)
Patient needed to speak with Dr. Buelah Manis at [place]

## 2017-06-22 NOTE — Telephone Encounter (Signed)
PATIENT CALLED WITH QUESTIONS ABOUT A CODE AND A BILL COMING BACK WITH A NON COVERED SERVICE

## 2017-06-22 NOTE — Telephone Encounter (Signed)
Patient called today at 1210 saying that she got something back from [facility] about how her OV with SF was coded. She would like to speak with someone about how it was coded and try to resubmit back to her insurance. Please call her at [phone]

## 2017-06-22 NOTE — Telephone Encounter (Signed)
Patient needed to speak with Dr. Buelah Manis at [place]

## 2017-06-23 ENCOUNTER — Encounter: Payer: Self-pay | Admitting: Orthopedic Surgery

## 2017-06-23 ENCOUNTER — Ambulatory Visit (INDEPENDENT_AMBULATORY_CARE_PROVIDER_SITE_OTHER): Admitting: Orthopedic Surgery

## 2017-06-23 ENCOUNTER — Ambulatory Visit: Admitting: Orthopaedic Surgery

## 2017-06-23 VITALS — BP 126/72 | HR 92 | Ht 67.0 in | Wt 210.0 lb

## 2017-06-23 DIAGNOSIS — M75121 Complete rotator cuff tear or rupture of right shoulder, not specified as traumatic: Secondary | ICD-10-CM

## 2017-06-23 DIAGNOSIS — M25512 Pain in left shoulder: Secondary | ICD-10-CM

## 2017-06-23 DIAGNOSIS — G8929 Other chronic pain: Secondary | ICD-10-CM | POA: Diagnosis not present

## 2017-06-23 DIAGNOSIS — M19019 Primary osteoarthritis, unspecified shoulder: Secondary | ICD-10-CM

## 2017-06-23 NOTE — Patient Instructions (Signed)
Continue therapy
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
## 2017-06-24 ENCOUNTER — Ambulatory Visit (INDEPENDENT_AMBULATORY_CARE_PROVIDER_SITE_OTHER): Admitting: *Deleted

## 2017-06-24 ENCOUNTER — Encounter (HOSPITAL_COMMUNITY): Payer: Self-pay

## 2017-06-24 ENCOUNTER — Ambulatory Visit (HOSPITAL_COMMUNITY)

## 2017-06-24 ENCOUNTER — Encounter (HOSPITAL_COMMUNITY)

## 2017-06-24 DIAGNOSIS — M542 Cervicalgia: Secondary | ICD-10-CM

## 2017-06-24 DIAGNOSIS — Z23 Encounter for immunization: Secondary | ICD-10-CM

## 2017-06-24 DIAGNOSIS — M25512 Pain in left shoulder: Secondary | ICD-10-CM

## 2017-06-24 DIAGNOSIS — G8929 Other chronic pain: Secondary | ICD-10-CM

## 2017-06-24 DIAGNOSIS — M25612 Stiffness of left shoulder, not elsewhere classified: Secondary | ICD-10-CM

## 2017-06-24 DIAGNOSIS — R29898 Other symptoms and signs involving the musculoskeletal system: Secondary | ICD-10-CM

## 2017-06-24 MED ORDER — NEOMYCIN-POLYMYXIN-DEXAMETH 3.5-10000-0.1 OP OINT: 1.0000 "application " | TOPICAL_OINTMENT | Freq: Every evening | OPHTHALMIC | 3 refills | Status: DC | PRN

## 2017-06-24 NOTE — Telephone Encounter (Signed)
So what exactly needs to be done?

## 2017-06-24 NOTE — Progress Notes (Signed)
Patient seen in office for Influenza Vaccination.   Tolerated IM administration well.   Immunization history updated.

## 2017-06-24 NOTE — Telephone Encounter (Signed)
The issue is that you coded it as a preventive care visit and not an office visit. She told the insurance company that she hadn't come in for a preventative visit that she came in because she was tired. I spoke with patient this morning after her injection shot. I told her that she was scheduled for a complete physical back in April and that when she came in for the visit in August she also mentioned other issues and that you didn't charge her an office visit and a preventative visit. She states that she feels you did nothing different at this visit than any other visits other than 2 different blood test. She states that [facility] said that this isn't a covered services and /or benefit for diagnosis listed. I checked to make sure the correct dx code was the Z00.00 which it was so I tried to explain to patient that the bill she received per her insurance is her responsibility. Since this is not a covered benefit. She states that when she saw Complete Physical as her next office visit she assumed that it was like any other office visit especially since she came in with other issues.
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
## 2017-06-25 ENCOUNTER — Encounter (HOSPITAL_COMMUNITY): Admitting: Occupational Therapy

## 2017-06-25 NOTE — Telephone Encounter (Signed)
Patient called back today I spent 15 mins with her over the phone. She is hung up on the verbiage of Complete vs General Routine Exam. I tried again to explain to patient that our system we use Complete Physical to schedule routine exams aka CPE.   I spoke with Tonya in reference to patients invoice and she said to reach out to [facility] and see if they cover Preventive care with dx code that was filed. If they do cover she states to resubmit claim without any other dx code on claim. I tried calling [employer] with no answer will try again.

## 2017-06-29 ENCOUNTER — Ambulatory Visit (HOSPITAL_COMMUNITY)

## 2017-06-29 DIAGNOSIS — M25612 Stiffness of left shoulder, not elsewhere classified: Secondary | ICD-10-CM

## 2017-06-29 DIAGNOSIS — M542 Cervicalgia: Secondary | ICD-10-CM | POA: Diagnosis not present

## 2017-06-29 DIAGNOSIS — G8929 Other chronic pain: Secondary | ICD-10-CM

## 2017-06-29 DIAGNOSIS — R29898 Other symptoms and signs involving the musculoskeletal system: Secondary | ICD-10-CM

## 2017-06-29 DIAGNOSIS — M25512 Pain in left shoulder: Secondary | ICD-10-CM
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
## 2017-07-01 ENCOUNTER — Ambulatory Visit (HOSPITAL_COMMUNITY)

## 2017-07-01 DIAGNOSIS — M25512 Pain in left shoulder: Secondary | ICD-10-CM

## 2017-07-01 DIAGNOSIS — G8929 Other chronic pain: Secondary | ICD-10-CM

## 2017-07-01 DIAGNOSIS — M542 Cervicalgia: Secondary | ICD-10-CM | POA: Diagnosis not present

## 2017-07-01 DIAGNOSIS — M25612 Stiffness of left shoulder, not elsewhere classified: Secondary | ICD-10-CM

## 2017-07-01 DIAGNOSIS — R29898 Other symptoms and signs involving the musculoskeletal system: Secondary | ICD-10-CM

## 2017-07-01 NOTE — Patient Instructions (Signed)
SHOULDER ABC's  While standing and holding a small ball or free weight, write out the alphabet in the air with your arm.  Only your arm should be moving as you perform this.     Ball Stabs Flex/Abd  Using a small ball or towel/cloth bring effected arm/shoulder up in front or to the side inline with the shoulder joint. Make small circles in both forwards and to the side in clockwise and counter clockwise directions. Make sure head is looking forward and proper posture is maintained during exercise.  1 minute each position .     Complete 10-15 repetitions. Do 1 set 1 time a day.  FREE WEIGHT FLEXION IN NEUTRAL ROTATION  Start with your arms down by your side. While holding a free weight with your palm facing your side and your elbows straight raise up your arm forward as shown then return to starting position.      FREE WEIGHT - ABDUCTION  While holding a weight and elbow straight, bring up your arm to the side.     Chest Press  Sit in a chair with your head up and back straight. Start with your elbows bent holding the weights at your chest. Push the weights straight out in front of you until your arms are straight. Pull the weights back slowly to the starting position.
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
## 2017-07-02 ENCOUNTER — Ambulatory Visit (INDEPENDENT_AMBULATORY_CARE_PROVIDER_SITE_OTHER): Admitting: Family Medicine

## 2017-07-02 ENCOUNTER — Encounter: Payer: Self-pay | Admitting: Family Medicine

## 2017-07-02 VITALS — BP 130/78 | HR 83 | Temp 98.5°F | Resp 14 | Ht 67.0 in | Wt 212.2 lb

## 2017-07-02 DIAGNOSIS — J209 Acute bronchitis, unspecified: Secondary | ICD-10-CM

## 2017-07-02 MED ORDER — DEXLANSOPRAZOLE 60 MG PO CPDR: 60.0000 mg | DELAYED_RELEASE_CAPSULE | Freq: Every day | ORAL | 3 refills | Status: DC

## 2017-07-02 MED ORDER — ALBUTEROL SULFATE HFA 108 (90 BASE) MCG/ACT IN AERS: 2.0000 | INHALATION_SPRAY | RESPIRATORY_TRACT | 3 refills | Status: DC | PRN

## 2017-07-02 MED ORDER — METHYLPREDNISOLONE ACETATE 40 MG/ML IJ SUSP
40.0000 mg | Freq: Once | INTRAMUSCULAR | Status: AC
  Administered 2017-07-02: 40 mg via INTRAMUSCULAR

## 2017-07-02 MED ORDER — PREDNISONE 20 MG PO TABS: 40.0000 mg | ORAL_TABLET | Freq: Every day | ORAL | 0 refills | Status: DC

## 2017-07-02 MED ORDER — BRIMONIDINE TARTRATE 0.2 % OP SOLN: 1.0000 [drp] | Freq: Two times a day (BID) | OPHTHALMIC | 6 refills | Status: DC

## 2017-07-02 MED ORDER — ONDANSETRON HCL 4 MG PO TABS
4.0000 mg | ORAL_TABLET | Freq: Three times a day (TID) | ORAL | 1 refills | Status: DC | PRN
Start: 2017-07-02 — End: 2018-07-06

## 2017-07-02 MED ORDER — ZOLPIDEM TARTRATE 10 MG PO TABS: ORAL_TABLET | ORAL | 2 refills | Status: DC

## 2017-07-02 MED ORDER — ESTRADIOL 0.1 MG/GM VA CREA: 1.0000 | TOPICAL_CREAM | Freq: Every day | VAGINAL | 3 refills | Status: DC

## 2017-07-02 MED ORDER — AMOXICILLIN 875 MG PO TABS: 875.0000 mg | ORAL_TABLET | Freq: Two times a day (BID) | ORAL | 0 refills | Status: DC

## 2017-07-02 MED ORDER — IRBESARTAN 150 MG PO TABS: 75.0000 mg | ORAL_TABLET | Freq: Every day | ORAL | 3 refills | Status: DC

## 2017-07-02 MED ORDER — HYDROCOD POLST-CPM POLST ER 10-8 MG/5ML PO SUER: 5.0000 mL | Freq: Two times a day (BID) | ORAL | 0 refills | Status: DC | PRN

## 2017-07-02 MED ORDER — CYCLOBENZAPRINE HCL 10 MG PO TABS: ORAL_TABLET | ORAL | 2 refills | Status: DC

## 2017-07-02 MED ORDER — IPRATROPIUM-ALBUTEROL 0.5-2.5 (3) MG/3ML IN SOLN
3.0000 mL | Freq: Once | RESPIRATORY_TRACT | Status: AC
  Administered 2017-07-02: 3 mL via RESPIRATORY_TRACT

## 2017-07-02 MED ORDER — ALBUTEROL SULFATE (2.5 MG/3ML) 0.083% IN NEBU: 2.5000 mg | INHALATION_SOLUTION | RESPIRATORY_TRACT | 1 refills | Status: DC | PRN

## 2017-07-02 MED ORDER — METRONIDAZOLE 0.75 % EX CREA: 1.0000 "application " | TOPICAL_CREAM | CUTANEOUS | 3 refills | Status: DC | PRN

## 2017-07-02 MED ORDER — LATANOPROST 0.005 % OP SOLN: 1.0000 [drp] | Freq: Every day | OPHTHALMIC | 3 refills | Status: DC

## 2017-07-02 MED ORDER — FENOFIBRATE 145 MG PO TABS: 145.0000 mg | ORAL_TABLET | Freq: Every day | ORAL | 3 refills | Status: DC

## 2017-07-02 MED ORDER — ALPRAZOLAM 1 MG PO TABS: 1.0000 mg | ORAL_TABLET | Freq: Two times a day (BID) | ORAL | 0 refills | Status: DC | PRN

## 2017-07-02 NOTE — Progress Notes (Signed)
   Subjective:    Patient ID: [name], female    DOB: [birth year], 66 y.o.   MRN: [id number]  Patient presents for coughing up blood (started yesterday) and Chest Pain    Sat/Sunday felt achy and had low grade fever, has had some of her regular allergy drainage, took advil sinus tablet. Cough started yesterday with blood tinged sputum.  Has been using inhaler, and nebulizer  Burning sensation in her chest as well  She still just felt tight in the chest. States when she gets bronchitis it comes on quickly which she has had asthma bronchitis as well as pneumonia multiple times   Review Of Systems:  GEN- denies fatigue, fever, weight loss,weakness, recent illness HEENT- denies eye drainage, change in vision, nasal discharge, CVS- denies chest pain, palpitations RESP-denies SOB, +cough,+ wheeze ABD- denies N/V, change in stools, abd pain GU- denies dysuria, hematuria, dribbling, incontinence MSK- denies joint pain, muscle aches, injury Neuro- denies headache, dizziness, syncope, seizure activity       Objective:    BP 130/78   Pulse 83   Temp 98.5 F (36.9 C) (Oral)   Resp 14   Ht 5' 7"  (1.702 m)   Wt 212 lb 3.2 oz (96.3 kg)   SpO2 98%   BMI 33.24 kg/m  GEN- NAD, alert and oriented x3 HEENT- PERRL, EOMI, non injected sclera, pink conjunctiva, MMM, oropharynx clear Neck- Supple, no LAD CVS- RRR, no murmur RESP-scatterd wheeze, good air movement, mild rhonchi, no rales,harsh cough ABD-NABS,soft,NT,ND EXT- No edema Pulses- Radial  2+   Given neb in office improve bronchospasm     Assessment & Plan:      Problem List Items Addressed This Visit    None    Visit Diagnoses    Acute bronchitis, unspecified organism    -  Primary   Treat for bronchitis, cover bacterial multiple bouts of PNA start as bronchitis, amox, steroid, albuterol nebs. Depo medrol given in office, cxr if not improved.tussionex given Blood tinged sputum not hemotpysis and normal oxygen sat    Relevant Medications   methylPREDNISolone acetate (DEPO-MEDROL) injection 40 mg (Completed)   ipratropium-albuterol (DUONEB) 0.5-2.5 (3) MG/3ML nebulizer solution 3 mL (Completed)      Note: This dictation was prepared with Dragon dictation along with smaller phrase technology. Any transcriptional errors that result from this process are unintentional.

## 2017-07-02 NOTE — Patient Instructions (Addendum)
Amoxicllin as prescribed  Take steroids  Use nebulizer Take cough medicine as prescribed F/U as previous

## 2017-07-04 ENCOUNTER — Encounter: Payer: Self-pay | Admitting: Family Medicine

## 2017-07-05 ENCOUNTER — Encounter (HOSPITAL_COMMUNITY)

## 2017-07-08 ENCOUNTER — Encounter (HOSPITAL_COMMUNITY)

## 2017-07-12 ENCOUNTER — Telehealth: Payer: Self-pay | Admitting: Orthopedic Surgery

## 2017-07-12 NOTE — Telephone Encounter (Signed)
Patient's shoulder is hurting badly again. She was discharged from PT on the 27th, she was doing great at that time.  She woke up yesterday with pain and limited mobility.  Should she come in to be seen this week or start again in PT?  She has an appointment with you next Wednesday, 07-21-17?

## 2017-07-13 ENCOUNTER — Encounter (HOSPITAL_COMMUNITY)

## 2017-07-14 ENCOUNTER — Ambulatory Visit (INDEPENDENT_AMBULATORY_CARE_PROVIDER_SITE_OTHER): Admitting: Orthopedic Surgery

## 2017-07-14 ENCOUNTER — Encounter: Payer: Self-pay | Admitting: Orthopedic Surgery

## 2017-07-14 VITALS — BP 134/70 | HR 97 | Ht 67.0 in | Wt 214.0 lb

## 2017-07-14 DIAGNOSIS — M75121 Complete rotator cuff tear or rupture of right shoulder, not specified as traumatic: Secondary | ICD-10-CM

## 2017-07-14 DIAGNOSIS — M25512 Pain in left shoulder: Secondary | ICD-10-CM

## 2017-07-14 DIAGNOSIS — G8929 Other chronic pain: Secondary | ICD-10-CM

## 2017-07-14 NOTE — Patient Instructions (Addendum)

## 2017-07-14 NOTE — Progress Notes (Signed)
Office visit  Chief Complaint  Patient presents with  . Follow-up    increasing pain left shoulder    66 years old she has rotator cuff tear she did not want have surgery. She went to therapy did well up until she started doing resistance exercises and then felt a pop in the left shoulder and then lost motion with increased pain in the biceps tendon area. Pain since Sunday.  Review of Systems  Constitutional: Negative for fever.  Skin: Negative.   Neurological: Positive for tingling.   BP 134/70   Pulse 97   Ht 5' 7" (1.702 m)   Wt 214 lb (97.1 kg)   BMI 33.52 kg/m   Physical Exam  Constitutional: She is oriented to person, place, and time. She appears well-developed and well-nourished.  Cardiovascular: Intact distal pulses.   Neurological: She is alert and oriented to person, place, and time.   Left Shoulder Exam   Tenderness  The patient is experiencing tenderness in the biceps tendon.  Range of Motion  Active Abduction:                       30 Passive Abduction:                    60 Forward Flexion:                        20 External Rotation:                      10   Muscle Strength  Abduction:            2/5 Supraspinatus:     2/5 Subscapularis:     5/5 Biceps:                 4/5  Tests  Sulcus:            Negative    Encounter Diagnoses  Name Primary?  . Chronic left shoulder pain Yes  . Complete tear of right rotator cuff    Procedure note the subacromial injection shoulder left   Verbal consent was obtained to inject the  Left   Shoulder  Timeout was completed to confirm the injection site is a subacromial space of the  left  shoulder  Medication used Depo-Medrol 40 mg and lidocaine 1% 3 cc  Anesthesia was provided by ethyl chloride  The injection was performed in the left  posterior subacromial space. After pinning the skin with alcohol and anesthetized the skin with ethyl chloride the subacromial space was injected using a 20-gauge needle.  There were no complications  Sterile dressing was applied.   OT at [facility];  [facility]  Return Oct 24

## 2017-07-15 ENCOUNTER — Encounter (HOSPITAL_COMMUNITY)

## 2017-07-20 ENCOUNTER — Ambulatory Visit (HOSPITAL_COMMUNITY)

## 2017-07-20 ENCOUNTER — Encounter (HOSPITAL_COMMUNITY)

## 2017-07-21 ENCOUNTER — Ambulatory Visit: Admitting: Orthopedic Surgery

## 2017-07-22 ENCOUNTER — Encounter (HOSPITAL_COMMUNITY)

## 2017-07-23 ENCOUNTER — Ambulatory Visit (HOSPITAL_COMMUNITY): Attending: Orthopedic Surgery

## 2017-07-23 ENCOUNTER — Encounter (HOSPITAL_COMMUNITY): Payer: Self-pay

## 2017-07-23 ENCOUNTER — Encounter (HOSPITAL_COMMUNITY)

## 2017-07-23 DIAGNOSIS — G8929 Other chronic pain: Secondary | ICD-10-CM | POA: Diagnosis not present

## 2017-07-23 DIAGNOSIS — M25612 Stiffness of left shoulder, not elsewhere classified: Secondary | ICD-10-CM | POA: Insufficient documentation

## 2017-07-23 DIAGNOSIS — R29898 Other symptoms and signs involving the musculoskeletal system: Secondary | ICD-10-CM

## 2017-07-23 DIAGNOSIS — M25512 Pain in left shoulder: Secondary | ICD-10-CM | POA: Insufficient documentation

## 2017-07-23 NOTE — Patient Instructions (Signed)
1) Complete table slides 3-5 times a day. Stay within your pain tolerance. Complete each table slide for 1-2 minutes each.   2) You make continue with the paddle, criss cross, and circles laying down. 10-15 times.   3) For pain management: continue with any prescribed pain medication and heat as needed.   4) Don't do any lifting with the left arm right now.   Complete OT 2 times a week for 4 weeks.
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
## 2017-07-26 ENCOUNTER — Encounter (HOSPITAL_COMMUNITY): Payer: Self-pay | Admitting: Specialist

## 2017-07-26 ENCOUNTER — Ambulatory Visit (HOSPITAL_COMMUNITY): Admitting: Specialist

## 2017-07-26 DIAGNOSIS — M25612 Stiffness of left shoulder, not elsewhere classified: Secondary | ICD-10-CM

## 2017-07-26 DIAGNOSIS — M25512 Pain in left shoulder: Secondary | ICD-10-CM

## 2017-07-26 DIAGNOSIS — R29898 Other symptoms and signs involving the musculoskeletal system: Secondary | ICD-10-CM

## 2017-07-26 DIAGNOSIS — G8929 Other chronic pain: Secondary | ICD-10-CM

## 2017-07-26 NOTE — Telephone Encounter (Signed)
I spoke with Tonya in billing and she has corrected DX code and resubmitted to the insurance. Patient is aware that we have resubmitted to insurance company.

## 2017-07-27 ENCOUNTER — Encounter (HOSPITAL_COMMUNITY)
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
## 2017-07-28 ENCOUNTER — Ambulatory Visit (HOSPITAL_COMMUNITY)

## 2017-07-28 ENCOUNTER — Encounter: Payer: Self-pay | Admitting: Orthopedic Surgery

## 2017-07-28 ENCOUNTER — Ambulatory Visit (INDEPENDENT_AMBULATORY_CARE_PROVIDER_SITE_OTHER): Admitting: Orthopedic Surgery

## 2017-07-28 VITALS — BP 153/87 | HR 91 | Ht 67.0 in | Wt 212.0 lb

## 2017-07-28 DIAGNOSIS — G8929 Other chronic pain: Secondary | ICD-10-CM

## 2017-07-28 DIAGNOSIS — M25512 Pain in left shoulder: Secondary | ICD-10-CM

## 2017-07-28 DIAGNOSIS — M75122 Complete rotator cuff tear or rupture of left shoulder, not specified as traumatic: Secondary | ICD-10-CM

## 2017-07-28 DIAGNOSIS — R29898 Other symptoms and signs involving the musculoskeletal system: Secondary | ICD-10-CM | POA: Diagnosis not present

## 2017-07-28 DIAGNOSIS — M25612 Stiffness of left shoulder, not elsewhere classified: Secondary | ICD-10-CM

## 2017-07-28 NOTE — Progress Notes (Signed)
Progress Note   Patient ID: [name], female   DOB: 1950/12/30, 66 y.o.   MRN: [id number]  Chief Complaint  Patient presents with  . Shoulder Pain    left shoulder has gone for PT c/o soreness  . forms    patient requests forms filled out for [place] since she is left handed, she is asking for assistance in home     66 year old female comes for follow-up she has rotator cuff tear we  Evaluated her for possible surgery should she opt for non-operative treatment but in therapy she felt a pop and had increasing pain she comes in after injection.  She is left-hand dominant. She is having trouble putting her clothes on taking care of simple chores around the house. She cannot lift anything She's having trouble with self-care as well.  She can't have any sober surgery because of this trip she has a lot of money invested in it and cannot cancel     Review of Systems  Musculoskeletal:       Musculoskeletal chest pain and neck pain with headache  Neurological: Negative for tingling.   No outpatient prescriptions have been marked as taking for the 07/28/17 encounter (Office Visit) with Carole Civil, MD.    Allergies  Allergen Reactions  . Adhesive [Tape] Other (See Comments)    Blisters skin  . Aspirin Other (See Comments)    sickle cell trait--  Not recommended unless emergency  . Keflex [Cephalexin] Hives  . Levaquin [Levofloxacin In D5w]     Altered mental status   . Statins Other (See Comments)    Myopathy - elevated CK  . Sulfonamide Derivatives Other (See Comments)    Patient has sickle cell trait  . Latex Rash     BP (!) 153/87   Pulse 91   Ht 5' 7"  (1.702 m)   Wt 212 lb (96.2 kg)   BMI 33.20 kg/m   Physical Exam  Musculoskeletal:       Left shoulder: She exhibits decreased range of motion, tenderness, crepitus, pain, spasm and decreased strength. She exhibits no swelling, no effusion, no deformity, no laceration and normal pulse.     Gen. appearance  the patient's appearance is normal with normal grooming and  hygiene The patient is oriented to person place and time Mood and affect are normal  BP (!) 153/87   Pulse 91   Ht 5' 7"  (1.702 m)   Wt 212 lb (96.2 kg)   BMI 33.20 kg/m  Ortho Exam    Medical decision-making Encounter Diagnoses  Name Primary?  . Chronic left shoulder pain Yes  . Complete tear of left rotator cuff     When she comes back from her trip we will schedule her for rotator cuff repair and we will need to get her discharge plans settle prior to admission. She lives alone; she has no local relatives.   Joanne Abbott, MD 07/28/2017 10:51 AM
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
## 2017-07-29 ENCOUNTER — Encounter (HOSPITAL_COMMUNITY)

## 2017-08-03 ENCOUNTER — Encounter (HOSPITAL_COMMUNITY): Admitting: Occupational Therapy

## 2017-08-04 ENCOUNTER — Ambulatory Visit (HOSPITAL_COMMUNITY): Admitting: Specialist

## 2017-08-04 ENCOUNTER — Telehealth (HOSPITAL_COMMUNITY): Payer: Self-pay | Admitting: Family Medicine

## 2017-08-04 NOTE — Telephone Encounter (Signed)
08/04/17  Pt cx because she said that her throat was sore and she didn't want to get anyone here sick

## 2017-08-05 ENCOUNTER — Telehealth (HOSPITAL_COMMUNITY): Payer: Self-pay | Admitting: Family Medicine

## 2017-08-05 ENCOUNTER — Encounter (HOSPITAL_COMMUNITY): Admitting: Occupational Therapy

## 2017-08-05 NOTE — Telephone Encounter (Signed)
08/05/17  cx - said that she wasn't feeling any better

## 2017-08-06 ENCOUNTER — Ambulatory Visit (HOSPITAL_COMMUNITY): Admitting: Specialist

## 2017-08-10 ENCOUNTER — Ambulatory Visit (HOSPITAL_COMMUNITY): Attending: Orthopedic Surgery

## 2017-08-10 ENCOUNTER — Encounter (HOSPITAL_COMMUNITY): Payer: Self-pay

## 2017-08-10 ENCOUNTER — Encounter (HOSPITAL_COMMUNITY)

## 2017-08-10 DIAGNOSIS — G8929 Other chronic pain: Secondary | ICD-10-CM | POA: Insufficient documentation

## 2017-08-10 DIAGNOSIS — M25512 Pain in left shoulder: Secondary | ICD-10-CM | POA: Diagnosis present

## 2017-08-10 DIAGNOSIS — R29898 Other symptoms and signs involving the musculoskeletal system: Secondary | ICD-10-CM | POA: Insufficient documentation

## 2017-08-10 DIAGNOSIS — M25612 Stiffness of left shoulder, not elsewhere classified: Secondary | ICD-10-CM | POA: Diagnosis present

## 2017-08-10 DIAGNOSIS — M542 Cervicalgia: Secondary | ICD-10-CM | POA: Insufficient documentation

## 2017-08-10 NOTE — Patient Instructions (Signed)
Repeat all exercises 10-15 times, 1-2 times per day.  1) Shoulder Protraction    Begin with elbows by your side, slowly "punch" straight out in front of you.      2) Shoulder Flexion Standing:         Begin with arms at your side with thumbs pointed up, slowly raise both arms up and forward towards overhead.        3) Horizontal abduction/adduction   Standing:           Begin with arms straight out in front of you, bring out to the side in at "T" shape. Keep arms straight entire time.       4) Internal & External Rotation    *No band* -Stand with elbows at the side and elbows bent 90 degrees. Move your forearms away from your body, then bring back inward toward the body.     5) Shoulder Abduction  Standing:       Lying on your back begin with your arms flat on the table next to your side. Slowly move your arms out to the side so that they go overhead, in a jumping jack or snow angel movement.  *Only go to shoulder height. Keep thumbs up.
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
## 2017-08-11 ENCOUNTER — Ambulatory Visit (INDEPENDENT_AMBULATORY_CARE_PROVIDER_SITE_OTHER): Admitting: Gastroenterology

## 2017-08-11 ENCOUNTER — Encounter: Payer: Self-pay | Admitting: Gastroenterology

## 2017-08-11 DIAGNOSIS — K219 Gastro-esophageal reflux disease without esophagitis: Secondary | ICD-10-CM | POA: Diagnosis not present

## 2017-08-11 DIAGNOSIS — K582 Mixed irritable bowel syndrome: Secondary | ICD-10-CM

## 2017-08-11 DIAGNOSIS — K6289 Other specified diseases of anus and rectum: Secondary | ICD-10-CM

## 2017-08-11 DIAGNOSIS — K7581 Nonalcoholic steatohepatitis (NASH): Secondary | ICD-10-CM | POA: Diagnosis not present

## 2017-08-11 NOTE — Assessment & Plan Note (Signed)
SYMPTOMS NOT IDEALLY CONTROLLED due to bloating. BLOATING MOST LIKELY DUE TO DAILY MIRALAX USE.  CHANGE MIRALAX TO MWF. DRINK WATER EAT FIBER FOLLOW UP IN 6 MOS.

## 2017-08-11 NOTE — Patient Instructions (Signed)
CONTINUE YOUR WEIGHT LOSS EFFORTS.LIMIT TO 300-440 CALORIES THREE TIMES A DAY WITH SNACKS IN BETWEEN (30-50 CALORIES).  USE PREPARATION H  OR HEMORRHOID CREAM IF NEEDED UP TOFOUR TIMES  A DAY IF NEEDED TO RELIEVE RECTAL PAIN/PRESSURE/BLEEDING.  USE Nitroglycerin ointment  pea sized amount internally IF NEEDED UP TO four times daily FOR ONE MONTH. IT MAY CAUSE HEADACHES OR DROP IN BLOOD PRESSURE.   CONTINUE DEXILANT.  FOLLOW UP IN 6 MOS.
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
## 2017-08-11 NOTE — Assessment & Plan Note (Signed)
SYMPTOMS FAIRLY WELL CONTROLLED WITH CA CREAM AND NTG OINTMENT.  WARNED AGAINST REGULAR USE OF CA CREAM AND SIDE EFFECTS: SKIN ATROPHY. NTG OINTMENT PRN. PT ASKED FOR PERCOCET. DECLINED AND EXPLAINED I DO NOT PRESCRIBE NARCOTICS ANYMORE.

## 2017-08-11 NOTE — Progress Notes (Signed)
ON RECALL  °

## 2017-08-11 NOTE — Progress Notes (Signed)
cc'ed to pcp °

## 2017-08-11 NOTE — Assessment & Plan Note (Signed)
WEIGHT UNCHANGED.  CONTINUE YOUR WEIGHT LOSS EFFORTS. LIMIT TO 300-440 CALORIES THREE TIMES A DAY WITH SNACKS IN BETWEEN (30-50 CALORIES). FOLLOW UP IN 6 MOS.

## 2017-08-11 NOTE — Assessment & Plan Note (Signed)
SYMPTOMS CONTROLLED/RESOLVED.  CONTINUE DEXILANT CONTINUE TO MONITOR SYMPTOMS. FOLLOW UP IN 6 MOS.

## 2017-08-12 ENCOUNTER — Ambulatory Visit (HOSPITAL_COMMUNITY)

## 2017-08-12 ENCOUNTER — Encounter (HOSPITAL_COMMUNITY)

## 2017-08-12 ENCOUNTER — Encounter (HOSPITAL_COMMUNITY): Payer: Self-pay

## 2017-08-12 DIAGNOSIS — G8929 Other chronic pain: Secondary | ICD-10-CM

## 2017-08-12 DIAGNOSIS — R29898 Other symptoms and signs involving the musculoskeletal system: Secondary | ICD-10-CM

## 2017-08-12 DIAGNOSIS — M25612 Stiffness of left shoulder, not elsewhere classified: Secondary | ICD-10-CM

## 2017-08-12 DIAGNOSIS — M25512 Pain in left shoulder: Secondary | ICD-10-CM

## 2017-08-12 DIAGNOSIS — M542 Cervicalgia: Secondary | ICD-10-CM
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
## 2017-08-17 ENCOUNTER — Encounter (HOSPITAL_COMMUNITY)

## 2017-08-17 ENCOUNTER — Ambulatory Visit (HOSPITAL_COMMUNITY)

## 2017-08-17 ENCOUNTER — Telehealth (HOSPITAL_COMMUNITY): Payer: Self-pay | Admitting: Family Medicine

## 2017-08-17 NOTE — Telephone Encounter (Signed)
08/17/17  cx - she said she was in to much pain to do therapy... going to call her dr and thinks she should have surgery she is hurting really bad

## 2017-08-19 ENCOUNTER — Ambulatory Visit (HOSPITAL_COMMUNITY)

## 2017-08-19 ENCOUNTER — Encounter (HOSPITAL_COMMUNITY)

## 2017-08-19 ENCOUNTER — Telehealth (HOSPITAL_COMMUNITY): Payer: Self-pay | Admitting: Family Medicine

## 2017-08-19 NOTE — Telephone Encounter (Signed)
08/19/17  Pt cx via phone tree

## 2017-08-24 ENCOUNTER — Encounter (HOSPITAL_COMMUNITY)

## 2017-08-31 ENCOUNTER — Encounter (HOSPITAL_COMMUNITY)

## 2017-09-02 ENCOUNTER — Encounter (HOSPITAL_COMMUNITY)

## 2017-09-06 ENCOUNTER — Encounter (HOSPITAL_COMMUNITY)

## 2017-09-21 ENCOUNTER — Encounter: Payer: Self-pay | Admitting: Family Medicine

## 2017-09-23 ENCOUNTER — Telehealth: Payer: Self-pay | Admitting: Orthopedic Surgery

## 2017-09-23 NOTE — Telephone Encounter (Signed)
[name] called this morning to cancel her appointment for tomorrow.  She thinks she has the flu.  She doesnt want to reschedule right now.  She will call us back to do so.

## 2017-09-24 ENCOUNTER — Encounter (HOSPITAL_COMMUNITY)

## 2017-09-24 ENCOUNTER — Ambulatory Visit: Admitting: Orthopedic Surgery

## 2017-09-27 ENCOUNTER — Encounter (HOSPITAL_COMMUNITY)

## 2017-09-30 ENCOUNTER — Encounter (HOSPITAL_COMMUNITY)

## 2017-10-04 ENCOUNTER — Encounter (HOSPITAL_COMMUNITY)

## 2017-10-08 ENCOUNTER — Other Ambulatory Visit: Payer: Self-pay

## 2017-10-08 ENCOUNTER — Ambulatory Visit (INDEPENDENT_AMBULATORY_CARE_PROVIDER_SITE_OTHER): Payer: Medicare Other | Admitting: Family Medicine

## 2017-10-08 ENCOUNTER — Encounter: Payer: Self-pay | Admitting: Family Medicine

## 2017-10-08 VITALS — BP 128/82 | HR 98 | Temp 98.5°F | Resp 16 | Ht 67.0 in | Wt 214.0 lb

## 2017-10-08 DIAGNOSIS — K7581 Nonalcoholic steatohepatitis (NASH): Secondary | ICD-10-CM | POA: Diagnosis not present

## 2017-10-08 DIAGNOSIS — I10 Essential (primary) hypertension: Secondary | ICD-10-CM | POA: Diagnosis not present

## 2017-10-08 DIAGNOSIS — E782 Mixed hyperlipidemia: Secondary | ICD-10-CM

## 2017-10-08 DIAGNOSIS — N182 Chronic kidney disease, stage 2 (mild): Secondary | ICD-10-CM | POA: Diagnosis not present

## 2017-10-08 DIAGNOSIS — M62838 Other muscle spasm: Secondary | ICD-10-CM

## 2017-10-08 MED ORDER — HYDROCHLOROTHIAZIDE 12.5 MG PO TABS: 12.5000 mg | ORAL_TABLET | Freq: Every day | ORAL | 3 refills | Status: DC

## 2017-10-08 NOTE — Progress Notes (Signed)
Subjective:    Patient ID: [name], female    DOB: 1951/05/25, 67 y.o.   MRN: [id number]  Patient presents for Follow-up (is not fasting) and Neck Pain (x4 days- pain in neck and decresed ROM- can't turn head to L- swelling to back of neck)  Patient here to follow-up chronic medical problems.  Medications reviewed.  She is following with multiple specialists including gastroenterology for her chronic IBS constipation and rectal problems.  She also follows with orthopedics and has recently had therapy done because of chronic shoulder pain.    She is also referred to nephrology to follow her chronic kidney disease but has not seen them.  She has underlying hypertension she is was to suppuse to be on HCTZ and  irbesartan.  Has not been taking the HCTZ.   Hyperlipidemia along with Karlene Lineman.  She has chronically elevated liver function test.  She is due for repeat fasting labs today.  Neck pain for past 4 days, has pain turning mostly to left side, has pain when she turns to the left , feels tight light there is a knot  Using muscle rub with Arnaca and Biofreeze, tried heat and cold  Took a few doses of ibuprofen Has only taken 1/2 tablet of flexeril   [facility]- she will see Dr. Mordecai Rasmussen to get Cataract looked at again   Review Of Systems:  GEN- denies fatigue, fever, weight loss,weakness, recent illness HEENT- denies eye drainage, change in vision, nasal discharge, CVS- denies chest pain, palpitations RESP- denies SOB, cough, wheeze ABD- denies N/V, change in stools, abd pain GU- denies dysuria, hematuria, dribbling, incontinence MSK- denies joint pain, +muscle aches, injury Neuro- denies headache, dizziness, syncope, seizure activity       Objective:    BP 128/82   Pulse 98   Temp 98.5 F (36.9 C) (Oral)   Resp 16   Ht 5' 7"  (1.702 m)   Wt 214 lb (97.1 kg)   SpO2 96%   BMI 33.52 kg/m  GEN- NAD, alert and oriented x3 HEENT- PERRL, EOMI, non injected  sclera, pink conjunctiva, MMM, oropharynx clear Neck- Supple, no thyromegaly, TTP along left cervical paraspinals, mild swelling/spasm trapezius left side, neg spurlings, fair ROM CVS- RRR, no murmur RESP-CTAB ABD-NABS,soft,NT,ND EXT- No edema Pulses- Radial 2+        Assessment & Plan:      Problem List Items Addressed This Visit      Unprioritized   Hyperlipidemia   Relevant Medications   hydrochlorothiazide (HYDRODIURIL) 12.5 MG tablet   Other Relevant Orders   Lipid panel (Completed)   Nonalcoholic steatohepatitis (NASH)    Continue current medications Follows with GI as well      Relevant Orders   Comprehensive metabolic panel (Completed)   Lipid panel (Completed)   Essential hypertension, benign - Primary    bp looks good, she has not been taking the HCTZ, but states she does get some swelling, medication sent again      Relevant Medications   hydrochlorothiazide (HYDRODIURIL) 12.5 MG tablet   Other Relevant Orders   CBC with Differential/Platelet (Completed)   Comprehensive metabolic panel (Completed)   CKD (chronic kidney disease), stage II    Recheck renal functiopn, has not see nephrology yet       Other Visit Diagnoses    Neck muscle spasm       Treat with short term NSAID, muscle relaxer and heat, no red flags  Note: This dictation was prepared with Dragon dictation along with smaller phrase technology. Any transcriptional errors that result from this process are unintentional.

## 2017-10-08 NOTE — Patient Instructions (Addendum)
Use flexeril twice a day  Take Ibuprofen 438m twice a day for 1 week Massage  F/U 4 months - [facility] [employer]

## 2017-10-09 ENCOUNTER — Encounter: Payer: Self-pay | Admitting: Family Medicine

## 2017-10-09 LAB — COMPREHENSIVE METABOLIC PANEL
AG Ratio: 1.7 (calc) (ref 1.0–2.5)
ALBUMIN MSPROF: 4.5 g/dL (ref 3.6–5.1)
ALT: 41 U/L — AB (ref 6–29)
AST: 54 U/L — ABNORMAL HIGH (ref 10–35)
Alkaline phosphatase (APISO): 44 U/L (ref 33–130)
BUN: 13 mg/dL (ref 7–25)
CHLORIDE: 103 mmol/L (ref 98–110)
CO2: 28 mmol/L (ref 20–32)
Calcium: 10 mg/dL (ref 8.6–10.4)
Creat: 0.96 mg/dL (ref 0.50–0.99)
GLOBULIN: 2.6 g/dL (ref 1.9–3.7)
GLUCOSE: 114 mg/dL — AB (ref 65–99)
Potassium: 3.7 mmol/L (ref 3.5–5.3)
SODIUM: 141 mmol/L (ref 135–146)
TOTAL PROTEIN: 7.1 g/dL (ref 6.1–8.1)
Total Bilirubin: 0.8 mg/dL (ref 0.2–1.2)

## 2017-10-09 LAB — CBC WITH DIFFERENTIAL/PLATELET
BASOS ABS: 78 {cells}/uL (ref 0–200)
BASOS PCT: 1.2 %
EOS ABS: 319 {cells}/uL (ref 15–500)
Eosinophils Relative: 4.9 %
HCT: 39.5 % (ref 35.0–45.0)
HEMOGLOBIN: 13.5 g/dL (ref 11.7–15.5)
Lymphs Abs: 2074 cells/uL (ref 850–3900)
MCH: 32.6 pg (ref 27.0–33.0)
MCHC: 34.2 g/dL (ref 32.0–36.0)
MCV: 95.4 fL (ref 80.0–100.0)
MPV: 10.5 fL (ref 7.5–12.5)
Monocytes Relative: 7.2 %
Neutro Abs: 3562 cells/uL (ref 1500–7800)
Neutrophils Relative %: 54.8 %
PLATELETS: 219 10*3/uL (ref 140–400)
RBC: 4.14 10*6/uL (ref 3.80–5.10)
RDW: 12.4 % (ref 11.0–15.0)
TOTAL LYMPHOCYTE: 31.9 %
WBC: 6.5 10*3/uL (ref 3.8–10.8)
WBCMIX: 468 {cells}/uL (ref 200–950)

## 2017-10-09 LAB — LIPID PANEL
Cholesterol: 222 mg/dL — ABNORMAL HIGH (ref <200)
HDL: 33 mg/dL — ABNORMAL LOW (ref >50)
LDL Cholesterol (Calc): 149 mg/dL (calc) — ABNORMAL HIGH
NON-HDL CHOLESTEROL (CALC): 189 mg/dL — AB (ref <130)
TRIGLYCERIDES: 245 mg/dL — AB (ref <150)
Total CHOL/HDL Ratio: 6.7 (calc) — ABNORMAL HIGH (ref <5.0)

## 2017-10-09 NOTE — Assessment & Plan Note (Signed)
Continue current medications Follows with GI as well

## 2017-10-09 NOTE — Assessment & Plan Note (Signed)
bp looks good, she has not been taking the HCTZ, but states she does get some swelling, medication sent again

## 2017-10-09 NOTE — Assessment & Plan Note (Signed)
Recheck renal functiopn, has not see nephrology yet

## 2017-10-12 ENCOUNTER — Encounter: Payer: Self-pay | Admitting: *Deleted

## 2017-12-08 ENCOUNTER — Encounter: Payer: Self-pay | Admitting: Gastroenterology

## 2017-12-08 ENCOUNTER — Ambulatory Visit (INDEPENDENT_AMBULATORY_CARE_PROVIDER_SITE_OTHER): Payer: Medicare Other | Admitting: Gastroenterology

## 2017-12-08 DIAGNOSIS — K7581 Nonalcoholic steatohepatitis (NASH): Secondary | ICD-10-CM | POA: Diagnosis not present

## 2017-12-08 DIAGNOSIS — K601 Chronic anal fissure: Secondary | ICD-10-CM

## 2017-12-08 DIAGNOSIS — K219 Gastro-esophageal reflux disease without esophagitis: Secondary | ICD-10-CM | POA: Diagnosis not present

## 2017-12-08 DIAGNOSIS — K582 Mixed irritable bowel syndrome: Secondary | ICD-10-CM | POA: Diagnosis not present

## 2017-12-08 MED ORDER — NITROGLYCERIN 0.4 % RE OINT: TOPICAL_OINTMENT | RECTAL | 2 refills | Status: DC

## 2017-12-08 NOTE — Assessment & Plan Note (Signed)
SYMPTOMS FAIRLY WELL CONTROLLED.  CONTINUE DEXILANT IN THE MORNING. FOLLOW UP IN 4 MOS.

## 2017-12-08 NOTE — Patient Instructions (Addendum)
FOLLOW A LOW FODMAP DIET. SEE HANDOUT.  AVOID ITEMS THAT CAUSE BLOATING & GAS. SEE INFO BELOW.  PLEASE CALL ME WITH THE NAME OF THE ANTI-SPASM MEDICATION.  CONTINUE DEXILANT IN THE MORNING.  TAKE FDGARD AT NIGHT.  FOLLOW UP IN 4 MOS.   BLOATING AND GAS PREVENTION  Although gas and bloating may be uncomfortable and embarrassing, it is not life-threatening. Understanding causes, ways to reduce symptoms, and treatment will help most people find some relief.  Points to remember . Everyone has gas in the digestive tract. Marland Kitchen People often believe normal passage of gas to be excessive. . Gas comes from two main sources: swallowed air and normal breakdown of certain foods by harmless bacteria naturally present in the large intestine. . Many foods with carbohydrates can cause gas. Fats and proteins cause little gas. . Foods that may cause gas include o beans  o vegetables, such as broccoli, cabbage, brussels sprouts, onions, artichokes, and asparagus  o fruits, such as pears, apples, and peaches  o whole grains, such as whole wheat and bran  o soft drinks and fruit drinks  o milk and milk products, such as cheese and ice cream, and packaged foods prepared with lactose, such as bread, cereal, and salad dressing  o foods containing sorbitol, such as dietetic foods and sugar free candies and gums . The most common symptoms of gas are belching, flatulence, bloating, and abdominal pain. However, some of these symptoms are often caused by an intestinal disorder, such as irritable bowel syndrome, rather than too much gas. . The most common ways to reduce the discomfort of gas are changing diet, taking nonprescription medicines, and reducing the amount of air swallowed. . Digestive enzymes, such as FDgard, IBgard lactase supplements or probiotics, actually help digest carbohydrates and may allow people to eat foods that normally cause gas.

## 2017-12-08 NOTE — Assessment & Plan Note (Signed)
WEIGHT STABLE.  CONTINUE YOUR WEIGHT LOSS EFFORTS. FOLLOW UP IN 4 MOS.

## 2017-12-08 NOTE — Progress Notes (Signed)
CC'ED TO PCP 

## 2017-12-08 NOTE — Assessment & Plan Note (Signed)
SYMPTOMS NOT IDEALLY CONTROLLED.  FOLLOW A LOW FODMAP DIET.  HANDOUT GIVEN. AVOID ITEMS THAT CAUSE BLOATING & GAS SEE INFO BELOW. CALL ME WITH THE NAME OF THE ANTI-SPASM MEDICATION. Plan once daily dosing. CONTINUE DEXILANT IN THE MORNING. TAKE FDGARD AT NIGHT.  FOLLOW UP IN 4 MOS.
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
## 2017-12-08 NOTE — Assessment & Plan Note (Signed)
SYMPTOMS FAIRLY WELL CONTROLLED.  REQUESTS REFILLS FOR NTG. REFILL SENT. CONTINUE TO MONITOR SYMPTOMS.

## 2017-12-09 NOTE — Progress Notes (Signed)
ON RECALL  °

## 2017-12-21 DIAGNOSIS — H401132 Primary open-angle glaucoma, bilateral, moderate stage: Secondary | ICD-10-CM | POA: Diagnosis not present

## 2018-02-03 ENCOUNTER — Encounter

## 2018-02-03 ENCOUNTER — Ambulatory Visit: Admitting: Gastroenterology

## 2018-02-15 ENCOUNTER — Encounter: Payer: Medicare Other | Admitting: Family Medicine

## 2018-02-15 ENCOUNTER — Ambulatory Visit (INDEPENDENT_AMBULATORY_CARE_PROVIDER_SITE_OTHER): Payer: Medicare Other | Admitting: Family Medicine

## 2018-02-15 ENCOUNTER — Other Ambulatory Visit: Payer: Self-pay

## 2018-02-15 ENCOUNTER — Encounter: Payer: Self-pay | Admitting: Family Medicine

## 2018-02-15 VITALS — BP 112/70 | HR 86 | Temp 98.0°F | Resp 14 | Ht 67.0 in | Wt 213.0 lb

## 2018-02-15 DIAGNOSIS — Z1231 Encounter for screening mammogram for malignant neoplasm of breast: Secondary | ICD-10-CM | POA: Diagnosis not present

## 2018-02-15 DIAGNOSIS — J4521 Mild intermittent asthma with (acute) exacerbation: Secondary | ICD-10-CM | POA: Diagnosis not present

## 2018-02-15 DIAGNOSIS — K7581 Nonalcoholic steatohepatitis (NASH): Secondary | ICD-10-CM

## 2018-02-15 DIAGNOSIS — Z1239 Encounter for other screening for malignant neoplasm of breast: Secondary | ICD-10-CM

## 2018-02-15 DIAGNOSIS — Z23 Encounter for immunization: Secondary | ICD-10-CM

## 2018-02-15 DIAGNOSIS — I10 Essential (primary) hypertension: Secondary | ICD-10-CM | POA: Diagnosis not present

## 2018-02-15 DIAGNOSIS — Z0000 Encounter for general adult medical examination without abnormal findings: Secondary | ICD-10-CM | POA: Diagnosis not present

## 2018-02-15 DIAGNOSIS — N951 Menopausal and female climacteric states: Secondary | ICD-10-CM

## 2018-02-15 DIAGNOSIS — N182 Chronic kidney disease, stage 2 (mild): Secondary | ICD-10-CM | POA: Diagnosis not present

## 2018-02-15 MED ORDER — FENOFIBRATE 145 MG PO TABS: 145.0000 mg | ORAL_TABLET | Freq: Every day | ORAL | 3 refills | Status: DC

## 2018-02-15 MED ORDER — LATANOPROST 0.005 % OP SOLN: 1.0000 [drp] | Freq: Every day | OPHTHALMIC | 3 refills | Status: DC

## 2018-02-15 MED ORDER — NEOMYCIN-POLYMYXIN-DEXAMETH 3.5-10000-0.1 OP OINT: 1.0000 "application " | TOPICAL_OINTMENT | Freq: Every evening | OPHTHALMIC | 3 refills | Status: DC | PRN

## 2018-02-15 MED ORDER — DEXLANSOPRAZOLE 60 MG PO CPDR: 60.0000 mg | DELAYED_RELEASE_CAPSULE | Freq: Every day | ORAL | 3 refills | Status: DC

## 2018-02-15 MED ORDER — BRIMONIDINE TARTRATE 0.2 % OP SOLN: 1.0000 [drp] | Freq: Two times a day (BID) | OPHTHALMIC | 3 refills | Status: DC

## 2018-02-15 MED ORDER — ALBUTEROL SULFATE (2.5 MG/3ML) 0.083% IN NEBU: 2.5000 mg | INHALATION_SOLUTION | RESPIRATORY_TRACT | 1 refills | Status: DC | PRN

## 2018-02-15 MED ORDER — MECLIZINE HCL 12.5 MG PO TABS: 12.5000 mg | ORAL_TABLET | Freq: Three times a day (TID) | ORAL | 0 refills | Status: DC | PRN

## 2018-02-15 MED ORDER — ALBUTEROL SULFATE HFA 108 (90 BASE) MCG/ACT IN AERS: 2.0000 | INHALATION_SPRAY | RESPIRATORY_TRACT | 3 refills | Status: DC | PRN

## 2018-02-15 MED ORDER — ESTRADIOL 0.1 MG/GM VA CREA: 1.0000 | TOPICAL_CREAM | VAGINAL | 3 refills | Status: DC | PRN

## 2018-02-15 MED ORDER — ZOSTER VAC RECOMB ADJUVANTED 50 MCG/0.5ML IM SUSR: 0.5000 mL | Freq: Once | INTRAMUSCULAR | 0 refills | Status: AC

## 2018-02-15 MED ORDER — ZOLPIDEM TARTRATE 10 MG PO TABS: ORAL_TABLET | ORAL | 2 refills | Status: DC

## 2018-02-15 MED ORDER — CYCLOBENZAPRINE HCL 10 MG PO TABS: ORAL_TABLET | ORAL | 2 refills | Status: DC

## 2018-02-15 MED ORDER — ALPRAZOLAM 1 MG PO TABS: 1.0000 mg | ORAL_TABLET | Freq: Two times a day (BID) | ORAL | 0 refills | Status: DC | PRN

## 2018-02-15 MED ORDER — IRBESARTAN 150 MG PO TABS: 75.0000 mg | ORAL_TABLET | Freq: Every day | ORAL | 3 refills | Status: DC

## 2018-02-15 MED ORDER — HYDROCHLOROTHIAZIDE 12.5 MG PO TABS: 12.5000 mg | ORAL_TABLET | Freq: Every day | ORAL | 3 refills | Status: DC

## 2018-02-15 MED ORDER — LIFITEGRAST 5 % OP SOLN: 1.0000 [drp] | Freq: Two times a day (BID) | OPHTHALMIC | 3 refills | Status: DC

## 2018-02-15 MED ORDER — METRONIDAZOLE 0.75 % EX CREA: 1.0000 "application " | TOPICAL_CREAM | CUTANEOUS | 3 refills | Status: DC | PRN

## 2018-02-15 NOTE — Patient Instructions (Addendum)
Shingrix sent to pharmacy  Set up Mammogram  And Bone Density for end of June  prevnar 13 vaccine given  F/U 4 months

## 2018-02-15 NOTE — Progress Notes (Signed)
Subjective:   Patient presents for Medicare Annual/Subsequent preventive examination.   Pt here for welcome to medicare Medications reviewed  Complains of Vertigo- after episode of coughing ran into the wall because she was dizzy, everything was spinning and she felt n[place]a, lasted for a week or so, took benadryl and nausea pill and finally started to improve   Asthma- during the past few weeks, has had coughing, mild production, used albuterol, now cough improved   Occasionally gets swelling in hands and tingling, took 1 dose of ibuprofen which helped  Mentally feels good, has decided to stay in her home   For history of tobacco had CTA in 2016 no cancer  Review Past Medical/Family/Social: per EMR   Risk Factors  Current exercise habits: walks some Dietary issues discussed: Yes  Cardiac risk factors: Obesity (BMI >= 30 kg/m2). HTN  Depression Screen  (Note: if answer to either of the following is "Yes", a more complete depression screening is indicated)  Over the past two weeks, have you felt down, depressed or hopeless? No Over the past two weeks, have you felt little interest or pleasure in doing things? No Have you lost interest or pleasure in daily life? No Do you often feel hopeless? No Do you cry easily over simple problems? No   Activities of Daily Living  In your present state of health, do you have any difficulty performing the following activities?:  Driving? Yes  Managing money? No  Feeding yourself? No  Getting from bed to chair? No  Climbing a flight of stairs? No  Preparing food and eating?: No  Bathing or showering? No  Getting dressed: No  Getting to the toilet? No  Using the toilet:No  Moving around from place to place: No  In the past year have you fallen or had a near fall?:No  Are you sexually active? No  Do you have more than one partner? No   Hearing Difficulties:  Do you often ask people to speak up or repeat themselves? No  Do you  experience ringing or noises in your ears? Yes Do you have difficulty understanding soft or whispered voices? No  Do you feel that you have a problem with memory? No Do you often misplace items? No  Do you feel safe at home? Yes  Cognitive Testing  Alert? Yes Normal Appearance?Yes  Oriented to person? Yes Place? Yes  Time? Yes  Recall of three objects? Yes  Can perform simple calculations? Yes  Displays appropriate judgment?Yes  Can read the correct time from a watch face?Yes   List the Names of Other Physician/Practitioners you currently use:   Ophthalmology   GI     Screening Tests / Date Colonoscopy   UTD                   Zostavax Due  Mammogram UTD Influenza Vaccine  UTD Pneumonia- Due for prevnar  Tetanus/tdap UTD  ROS: GEN- denies fatigue, fever, weight loss,weakness, recent illness HEENT- denies eye drainage, change in vision, nasal discharge, CVS- denies chest pain, palpitations RESP- denies SOB, cough, wheeze ABD- denies N/V, change in stools, abd pain GU- denies dysuria, hematuria, dribbling, incontinence MSK- denies joint pain, muscle aches, injury Neuro- denies headache, dizziness, syncope, seizure activity  Physical: GEN- NAD, alert and oriented x3 HEENT- PERRL, EOMI, non injected sclera, pink conjunctiva, MMM, oropharynx clear Neck- Supple, no thryomegaly CVS- RRR, no murmur RESP-CTAB Psych- normal affect and mood  Neuro-CNII-XII in tact, no nystagmus EXT- No edema  Pulses- Radial, DP- 2+    Assessment:    Annual wellness medicare exam   Plan:    During the course of the visit the patient was educated and counseled about appropriate screening and preventive services including:  Screening mammography/Bone Density - pt to schedule   Immunizations: Prevnar 13  Given  Shingles vaccine. Prescription given to that she can get the vaccine at the pharmacy or Medicare part D.  Depression/anxiety- doing well   Vertigo- now resolved given meclizine  to have prn   Asthma controlled no changes   HTN- controlled no changes  CKD- check renal function   NASH/hyperlipidemia- followed by GI   Full CODE / has on file at [facility]   Diet review for nutrition referral? Yes ____ Not Indicated __x__  Patient Instructions (the written plan) was given to the patient.  Medicare Attestation  I have personally reviewed:  The patient's medical and social history  Their use of alcohol, tobacco or illicit drugs  Their current medications and supplements  The patient's functional ability including ADLs,fall risks, home safety risks, cognitive, and hearing and visual impairment  Diet and physical activities  Evidence for depression or mood disorders  The patient's weight, height, BMI, and visual acuity have been recorded in the chart. I have made referrals, counseling, and provided education to the patient based on review of the above and I have provided the patient with a written personalized care plan for preventive services.

## 2018-02-16 LAB — LIPID PANEL
CHOLESTEROL: 183 mg/dL (ref <200)
HDL: 30 mg/dL — AB (ref >50)
LDL Cholesterol (Calc): 119 mg/dL (calc) — ABNORMAL HIGH
Non-HDL Cholesterol (Calc): 153 mg/dL (calc) — ABNORMAL HIGH (ref <130)
Total CHOL/HDL Ratio: 6.1 (calc) — ABNORMAL HIGH (ref <5.0)
Triglycerides: 219 mg/dL — ABNORMAL HIGH (ref <150)

## 2018-02-16 LAB — COMPREHENSIVE METABOLIC PANEL
AG RATIO: 1.7 (calc) (ref 1.0–2.5)
ALBUMIN MSPROF: 4.3 g/dL (ref 3.6–5.1)
ALT: 35 U/L — ABNORMAL HIGH (ref 6–29)
AST: 48 U/L — ABNORMAL HIGH (ref 10–35)
Alkaline phosphatase (APISO): 38 U/L (ref 33–130)
BUN / CREAT RATIO: 13 (calc) (ref 6–22)
BUN: 15 mg/dL (ref 7–25)
CALCIUM: 10 mg/dL (ref 8.6–10.4)
CHLORIDE: 100 mmol/L (ref 98–110)
CO2: 27 mmol/L (ref 20–32)
CREATININE: 1.17 mg/dL — AB (ref 0.50–0.99)
GLOBULIN: 2.5 g/dL (ref 1.9–3.7)
GLUCOSE: 122 mg/dL — AB (ref 65–99)
POTASSIUM: 4.2 mmol/L (ref 3.5–5.3)
Sodium: 137 mmol/L (ref 135–146)
Total Bilirubin: 0.7 mg/dL (ref 0.2–1.2)
Total Protein: 6.8 g/dL (ref 6.1–8.1)

## 2018-02-16 LAB — CBC WITH DIFFERENTIAL/PLATELET
BASOS PCT: 1 %
Basophils Absolute: 61 cells/uL (ref 0–200)
EOS PCT: 6.9 %
Eosinophils Absolute: 421 cells/uL (ref 15–500)
HCT: 37.3 % (ref 35.0–45.0)
Hemoglobin: 12.8 g/dL (ref 11.7–15.5)
Lymphs Abs: 1806 cells/uL (ref 850–3900)
MCH: 32.1 pg (ref 27.0–33.0)
MCHC: 34.3 g/dL (ref 32.0–36.0)
MCV: 93.5 fL (ref 80.0–100.0)
MONOS PCT: 8.6 %
MPV: 10.3 fL (ref 7.5–12.5)
NEUTROS ABS: 3288 {cells}/uL (ref 1500–7800)
Neutrophils Relative %: 53.9 %
PLATELETS: 220 10*3/uL (ref 140–400)
RBC: 3.99 10*6/uL (ref 3.80–5.10)
RDW: 12 % (ref 11.0–15.0)
Total Lymphocyte: 29.6 %
WBC mixed population: 525 cells/uL (ref 200–950)
WBC: 6.1 10*3/uL (ref 3.8–10.8)

## 2018-03-15 ENCOUNTER — Telehealth: Payer: Self-pay | Admitting: General Practice

## 2018-03-15 NOTE — Telephone Encounter (Signed)
Patient called in stating that she has been having a lot of problems with her hemorrhoids.  She is experiencing a lot of pain, swelling, bleeding and discomfort.  I offered her an appt to see Randall Hiss tomorrow, however the patient declined and stated she would like to see SLF only.  She would like SLF to call her at [phone].

## 2018-03-16 NOTE — Telephone Encounter (Addendum)
Called patient TO DISCUSS CONCERNS. EVERY TIME SHE HAS A BM SHE FEELS LIKE SHE'S TEARING AND SHE SEES BLOOD. IT JUST HURTS. TRAVELLED TO [place] AND SHE WAS MISERABLE AND SHE CAN'T SIT. USING MEDICINE AND EATING SOFT FOODS AND LAYING DOWN. SOFT STOOLS FEEL BETTER GOING THROUGH ANUS. WANTS ME TO CHECK TO FIRST AND THEN SEE SURGERY.  BRING ENEMA TO PREOP. WE WILL APPLY LIDOCAINE JELLY TO PERINEUM PRIOR TO ENEMA AND PT AWARE FLEX SIG WILL BE DONE WITH MAC. UNDERSTANDS FLEX SIG WILL BE DONE WITH CONSCIOUS SEDATION.   SCHEDULE FLEX SIG FRI JUN 14 Dx: RECTAL PAIN/BLEEDING. OK TO SHARE INFO. PT KNOWS TO ARRIVE AT 0815. NEEDS PHENERGAN 25 MG IV IN PREOP.

## 2018-03-17 ENCOUNTER — Other Ambulatory Visit: Payer: Self-pay

## 2018-03-17 DIAGNOSIS — K625 Hemorrhage of anus and rectum: Secondary | ICD-10-CM

## 2018-03-17 DIAGNOSIS — K6289 Other specified diseases of anus and rectum: Secondary | ICD-10-CM

## 2018-03-17 MED ORDER — PROMETHAZINE HCL 25 MG/ML IJ SOLN: 25.0000 mg | Freq: Once | INTRAMUSCULAR | Status: DC

## 2018-03-17 NOTE — Telephone Encounter (Signed)
Called pt. Advised her to be NPO after midnight. She will take enema to hospital. Arrive at 8:15am tomorrow. Orders entered.

## 2018-03-17 NOTE — Telephone Encounter (Signed)
Endo scheduler informed to add Phenergan 67m IV in pre-op to order comments.

## 2018-03-18 ENCOUNTER — Encounter (HOSPITAL_COMMUNITY)
Admission: RE | Disposition: A | Discharge status: Home / Self Care | Payer: Self-pay | Source: Ambulatory Visit | Attending: Gastroenterology

## 2018-03-18 ENCOUNTER — Ambulatory Visit (HOSPITAL_COMMUNITY)
Admission: RE | Admit: 2018-03-18 | Discharge: 2018-03-18 | Disposition: A | Discharge status: Home / Self Care | Payer: Medicare Other | Source: Ambulatory Visit | Attending: Gastroenterology | Admitting: Gastroenterology

## 2018-03-18 ENCOUNTER — Other Ambulatory Visit: Payer: Self-pay

## 2018-03-18 ENCOUNTER — Encounter (HOSPITAL_COMMUNITY): Payer: Self-pay | Admitting: *Deleted

## 2018-03-18 DIAGNOSIS — Z886 Allergy status to analgesic agent status: Secondary | ICD-10-CM | POA: Diagnosis not present

## 2018-03-18 DIAGNOSIS — K6289 Other specified diseases of anus and rectum: Secondary | ICD-10-CM | POA: Insufficient documentation

## 2018-03-18 DIAGNOSIS — K219 Gastro-esophageal reflux disease without esophagitis: Secondary | ICD-10-CM | POA: Diagnosis not present

## 2018-03-18 DIAGNOSIS — Z79899 Other long term (current) drug therapy: Secondary | ICD-10-CM | POA: Insufficient documentation

## 2018-03-18 DIAGNOSIS — K649 Unspecified hemorrhoids: Secondary | ICD-10-CM | POA: Diagnosis not present

## 2018-03-18 DIAGNOSIS — Z882 Allergy status to sulfonamides status: Secondary | ICD-10-CM | POA: Diagnosis not present

## 2018-03-18 DIAGNOSIS — Z86010 Personal history of colonic polyps: Secondary | ICD-10-CM | POA: Insufficient documentation

## 2018-03-18 DIAGNOSIS — Z91040 Latex allergy status: Secondary | ICD-10-CM | POA: Insufficient documentation

## 2018-03-18 DIAGNOSIS — F329 Major depressive disorder, single episode, unspecified: Secondary | ICD-10-CM | POA: Diagnosis not present

## 2018-03-18 DIAGNOSIS — Z87442 Personal history of urinary calculi: Secondary | ICD-10-CM | POA: Insufficient documentation

## 2018-03-18 DIAGNOSIS — Z881 Allergy status to other antibiotic agents status: Secondary | ICD-10-CM | POA: Insufficient documentation

## 2018-03-18 DIAGNOSIS — I10 Essential (primary) hypertension: Secondary | ICD-10-CM | POA: Insufficient documentation

## 2018-03-18 DIAGNOSIS — Z800 Family history of malignant neoplasm of digestive organs: Secondary | ICD-10-CM | POA: Diagnosis not present

## 2018-03-18 DIAGNOSIS — K602 Anal fissure, unspecified: Secondary | ICD-10-CM | POA: Diagnosis not present

## 2018-03-18 DIAGNOSIS — Z888 Allergy status to other drugs, medicaments and biological substances status: Secondary | ICD-10-CM | POA: Insufficient documentation

## 2018-03-18 DIAGNOSIS — K760 Fatty (change of) liver, not elsewhere classified: Secondary | ICD-10-CM | POA: Insufficient documentation

## 2018-03-18 DIAGNOSIS — M1990 Unspecified osteoarthritis, unspecified site: Secondary | ICD-10-CM | POA: Insufficient documentation

## 2018-03-18 DIAGNOSIS — F419 Anxiety disorder, unspecified: Secondary | ICD-10-CM | POA: Diagnosis not present

## 2018-03-18 DIAGNOSIS — H548 Legal blindness, as defined in USA: Secondary | ICD-10-CM | POA: Diagnosis not present

## 2018-03-18 DIAGNOSIS — K625 Hemorrhage of anus and rectum: Secondary | ICD-10-CM | POA: Insufficient documentation

## 2018-03-18 DIAGNOSIS — D573 Sickle-cell trait: Secondary | ICD-10-CM | POA: Insufficient documentation

## 2018-03-18 DIAGNOSIS — Z87891 Personal history of nicotine dependence: Secondary | ICD-10-CM | POA: Insufficient documentation

## 2018-03-18 DIAGNOSIS — J45909 Unspecified asthma, uncomplicated: Secondary | ICD-10-CM | POA: Diagnosis not present

## 2018-03-18 DIAGNOSIS — E7800 Pure hypercholesterolemia, unspecified: Secondary | ICD-10-CM | POA: Insufficient documentation

## 2018-03-18 DIAGNOSIS — K589 Irritable bowel syndrome without diarrhea: Secondary | ICD-10-CM | POA: Diagnosis not present

## 2018-03-18 HISTORY — PX: FLEXIBLE SIGMOIDOSCOPY: SHX5431

## 2018-03-18 SURGERY — SIGMOIDOSCOPY, FLEXIBLE
Anesthesia: Moderate Sedation

## 2018-03-18 MED ORDER — MIDAZOLAM HCL 5 MG/5ML IJ SOLN
INTRAMUSCULAR | Status: DC | PRN
  Administered 2018-03-18 (×2): 2 mg via INTRAVENOUS
  Administered 2018-03-18: 1 mg via INTRAVENOUS

## 2018-03-18 MED ORDER — MIDAZOLAM HCL 5 MG/5ML IJ SOLN
INTRAMUSCULAR | Status: AC
  Filled 2018-03-18: qty 10

## 2018-03-18 MED ORDER — LIDOCAINE HCL URETHRAL/MUCOSAL 2 % EX GEL
CUTANEOUS | Status: AC
  Filled 2018-03-18: qty 30

## 2018-03-18 MED ORDER — SODIUM CHLORIDE 0.9 % IV SOLN
INTRAVENOUS | Status: DC
  Administered 2018-03-18: 09:00:00 via INTRAVENOUS

## 2018-03-18 MED ORDER — SODIUM CHLORIDE 0.9% FLUSH
INTRAVENOUS | Status: AC
  Filled 2018-03-18: qty 10

## 2018-03-18 MED ORDER — PROMETHAZINE HCL 25 MG/ML IJ SOLN
25.0000 mg | Freq: Once | INTRAMUSCULAR | Status: AC
  Administered 2018-03-18: 25 mg via INTRAVENOUS

## 2018-03-18 MED ORDER — NITROGLYCERIN 0.4 % RE OINT: TOPICAL_OINTMENT | RECTAL | 1 refills | Status: DC

## 2018-03-18 MED ORDER — PROMETHAZINE HCL 25 MG/ML IJ SOLN
INTRAMUSCULAR | Status: AC
  Administered 2018-03-18: 25 mg via INTRAVENOUS
  Filled 2018-03-18: qty 1

## 2018-03-18 MED ORDER — MEPERIDINE HCL 100 MG/ML IJ SOLN
INTRAMUSCULAR | Status: DC | PRN
  Administered 2018-03-18: 50 mg via INTRAVENOUS
  Administered 2018-03-18 (×2): 25 mg via INTRAVENOUS

## 2018-03-18 MED ORDER — MEPERIDINE HCL 100 MG/ML IJ SOLN
INTRAMUSCULAR | Status: AC
  Filled 2018-03-18: qty 2

## 2018-03-18 MED ORDER — LIDOCAINE-HYDROCORTISONE ACE 3-2.5 % RE KIT: PACK | RECTAL | 3 refills | Status: DC
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
## 2018-03-18 NOTE — Op Note (Signed)
[facility] Patient Name: [name] Procedure Date: 03/18/2018 9:24 AM MRN: [id number] Date of Birth: July 01, 1951 Attending MD: Barney Drain MD, MD CSN: [id number] Age: 67 Admit Type: Outpatient Procedure:                Flexible Sigmoidoscopy, DIAGNOSTIC Indications:              Anal pain Providers:                Barney Drain MD, MD, Lurline Del, RN, Nelma Rothman,                            Technician Referring MD:             Modena Nunnery. [place] Medicines:                Promethazine 25 mg IV, Meperidine 100 mg IV,                            Midazolam 5 mg IV Complications:            No immediate complications. Estimated Blood Loss:     Estimated blood loss: none. Procedure:                Pre-Anesthesia Assessment:                           - Prior to the procedure, a History and Physical                            was performed, and patient medications and                            allergies were reviewed. The patient's tolerance of                            previous anesthesia was also reviewed. The risks                            and benefits of the procedure and the sedation                            options and risks were discussed with the patient.                            All questions were answered, and informed consent                            was obtained. Prior Anticoagulants: The patient has                            taken no previous anticoagulant or antiplatelet                            agents. ASA Grade Assessment: II - A patient with  mild systemic disease. After reviewing the risks                            and benefits, the patient was deemed in                            satisfactory condition to undergo the procedure.                            After obtaining informed consent, the scope was                            passed under direct vision. The [id number] ([id number])                            scope was introduced  through the anus and advanced                            to the the sigmoid colon. The flexible                            sigmoidoscopy was somewhat difficult due to the                            patient's discomfort during the procedure.                            Successful completion of the procedure was aided by                            increasing the dose of sedation medication. The                            quality of the bowel preparation was good. Scope In: 9:47:50 AM Scope Out: 9:54:53 AM Total Procedure Duration: 0 hours 7 minutes 3 seconds  Findings:      An anal fissure was found on perianal exam.      Hemorrhoids were found on perianal exam.      The exam was otherwise without abnormality. Impression:               - Anal fissure found on perianal exam.                           - Hemorrhoids found on perianal exam.                           - The examination was otherwise normal. Moderate Sedation:      Moderate (conscious) sedation was administered by the endoscopy nurse       and supervised by the endoscopist. The following parameters were       monitored: oxygen saturation, heart rate, blood pressure, and response       to care. Total physician intraservice time was 24 minutes. Recommendation:           - Continue present medications. NTG OINTMENT 0.4%  QID FOR 2 MOS. APOTHECARY                            CREAM(LIDOCAINE/PRAMOXINE) QID FOR 2 WEEKS.                           - Soft diet. KEEP STOOLS SOFT.                           - Refer to a surgeon at the next available                            appointment.                           - Return to my office in 2 months.                           - Patient has a contact number available for                            emergencies. The signs and symptoms of potential                            delayed complications were discussed with the                            patient. Return to  normal activities tomorrow.                            Written discharge instructions were provided to the                            patient. Procedure Code(s):        --- Professional ---                           [phone], Sigmoidoscopy, flexible; diagnostic,                            including collection of specimen(s) by brushing or                            washing, when performed (separate procedure)                           G0500, Moderate sedation services provided by the                            same physician or other qualified health care                            professional performing a gastrointestinal                            endoscopic service that sedation  supports,                            requiring the presence of an independent trained                            observer to assist in the monitoring of the                            patient's level of consciousness and physiological                            status; initial 15 minutes of intra-service time;                            patient age 55 years or older (additional time may                            be reported with [phone], as appropriate)                           [phone], Moderate sedation services provided by the                            same physician or other qualified health care                            professional performing the diagnostic or                            therapeutic service that the sedation supports,                            requiring the presence of an independent trained                            observer to assist in the monitoring of the                            patient's level of consciousness and physiological                            status; each additional 15 minutes intraservice                            time (List separately in addition to code for                            primary service) Diagnosis Code(s):        --- Professional ---                            Z60.1, Unspecified hemorrhoids  K60.2, Anal fissure, unspecified                           K62.89, Other specified diseases of anus and rectum CPT copyright 2017 [employer] Association. All rights reserved. The codes documented in this report are preliminary and upon coder review may  be revised to meet current compliance requirements. Barney Drain, MD Barney Drain MD, MD 03/18/2018 10:07:59 AM This report has been signed electronically. Number of Addenda: 0

## 2018-03-18 NOTE — Discharge Instructions (Signed)
You have moderate external hemorrhoids and an anal fissure. YOU DID NOT HAVE ANY POLYPS.   TO keep stools soft: 1. FOLLOW A low fiber DIET. AVOID ITEMS THAT CAUSE BLOATING. SEE INFO BELOW. 2. Use colace if needed. 3. DRINK ENOUGH WATER TO KEEP URINE LIGHT YELLOW. 4. MAY Use Miralax if needed to keep stools soft.  TO TREAT RECTAL PAIN/BLEEDING: 1. CONTINUE Nitroglycerin ointment -Apply a pea sized amount internally four times daily FOR 2-3 MOS. 2.  USE APOTHECARY CREAM FOUR TIMES A DAY THEN IF NEEDED FOR RECTAL PAIN OR DISCOMFORT.  3.  TAKE SITZ BATH THREE TIMES A DAY IF NEED TO RELIEVE RECTAL PAIN 4. SEE SURGERY FOR RECTAL PAIN/FISSURE.  FOLLOW UP IN AUG 2019.     Sigmoidoscopy Care After Read the instructions outlined below and refer to this sheet in the next week. These discharge instructions provide you with general information on caring for yourself after you leave the hospital. While your treatment has been planned according to the most current medical practices available, unavoidable complications occasionally occur. If you have any problems or questions after discharge, call DR. Harmonii Karle, [phone].  ACTIVITY  You may resume your regular activity, but move at a slower pace for the next 24 hours.   Take frequent rest periods for the next 24 hours.   Walking will help get rid of the air and reduce the bloated feeling in your belly (abdomen).   No driving for 24 hours (because of the medicine (anesthesia) used during the test).   You may shower.   Do not sign any important legal documents or operate any machinery for 24 hours (because of the anesthesia used during the test).    NUTRITION  Drink plenty of fluids.   You may resume your normal diet as instructed by your doctor.   Begin with a light meal and progress to your normal diet. Heavy or fried foods are harder to digest and may make you feel sick to your stomach (nauseated).   Avoid alcoholic beverages for 24  hours or as instructed.    MEDICATIONS  You may resume your normal medications.   WHAT YOU CAN EXPECT TODAY  Some feelings of bloating in the abdomen.   Passage of more gas than usual.   Spotting of blood in your stool or on the toilet paper  .  IF YOU HAD POLYPS REMOVED DURING THE COLONOSCOPY:  Eat a soft diet IF YOU HAVE NAUSEA, BLOATING, ABDOMINAL PAIN, OR VOMITING.    FINDING OUT THE RESULTS OF YOUR TEST Not all test results are available during your visit. DR. Oneida Alar WILL CALL YOU WITHIN 7 DAYS OF YOUR PROCEDUE WITH YOUR RESULTS. Do not assume everything is normal if you have not heard from DR. Isela Stantz IN ONE WEEK, CALL HER OFFICE AT [phone].  SEEK IMMEDIATE MEDICAL ATTENTION AND CALL THE OFFICE: [phone] IF:  You have more than a spotting of blood in your stool.   Your belly is swollen (abdominal distention).   You are nauseated or vomiting.   You have a temperature over 101F.   You have abdominal pain or discomfort that is severe or gets worse throughout the day.   Hemorrhoids Hemorrhoids are dilated (enlarged) veins around the rectum. Sometimes clots will form in the veins. This makes them swollen and painful. These are called thrombosed hemorrhoids. Causes of hemorrhoids include:  Constipation.   Straining to have a bowel movement.   HEAVY LIFTING   HOME CARE INSTRUCTIONS  Eat a well  balanced diet and drink 6 to 8 glasses of water every day to avoid constipation. You may also use a bulk laxative.   Avoid straining to have bowel movements.   Keep anal area dry and clean.   Do not use a donut shaped pillow or sit on the toilet for long periods. This increases blood pooling and pain.   Move your bowels when your body has the urge; this will require less straining and will decrease pain and pressure.

## 2018-03-25 ENCOUNTER — Encounter (HOSPITAL_COMMUNITY): Payer: Self-pay | Admitting: Gastroenterology

## 2018-04-25 ENCOUNTER — Encounter: Payer: Self-pay | Admitting: Family Medicine

## 2018-05-12 ENCOUNTER — Encounter: Payer: Self-pay | Admitting: Gastroenterology

## 2018-05-12 ENCOUNTER — Ambulatory Visit (INDEPENDENT_AMBULATORY_CARE_PROVIDER_SITE_OTHER): Payer: Medicare Other | Admitting: Gastroenterology

## 2018-05-12 DIAGNOSIS — K219 Gastro-esophageal reflux disease without esophagitis: Secondary | ICD-10-CM | POA: Diagnosis not present

## 2018-05-12 DIAGNOSIS — K7581 Nonalcoholic steatohepatitis (NASH): Secondary | ICD-10-CM | POA: Diagnosis not present

## 2018-05-12 DIAGNOSIS — K582 Mixed irritable bowel syndrome: Secondary | ICD-10-CM

## 2018-05-12 DIAGNOSIS — K601 Chronic anal fissure: Secondary | ICD-10-CM

## 2018-05-12 NOTE — Progress Notes (Signed)
CC'D TO PCP °

## 2018-05-12 NOTE — Progress Notes (Signed)
ON RECALL  °

## 2018-05-12 NOTE — Assessment & Plan Note (Addendum)
SYMPTOMS FAIRLY WELL CONTROLLED. CLINICALLY IMPROVED.  LETTER DRAFTED AND GIVEN TO PT TODAY FOR TRAVEL INSURANCE COMPANY.  TO PREVENT CONSTIPATION:  1. FOLLOW A HIGH fiber DIET. AVOID ITEMS THAT CAUSE BLOATING.  2. Use Colace if needed. 3. DRINK ENOUGH WATER TO KEEP URINE LIGHT YELLOW. 4. Use Miralax OR SENNA if needed to keep stools soft.  TO TREAT RECTAL PAIN/BLEEDING: 1. CONTINUE Nitroglycerin ointment -Apply a pea sized amount internally four times daily FOR 3 MOS. 2.  USE APOTHECARY CREAM UP TO FOUR TIMES A DAY THEN IF NEEDED FOR RECTAL PAIN OR DISCOMFORT.    I WILL REFER YOU TO SURGERY FOR RECTAL PAIN/FISSURE IF YOU DO NOT RESPOND TO MEDICAL MANAGEMENT.  FOLLOW UP IN 6 MOS.

## 2018-05-12 NOTE — Patient Instructions (Addendum)
  TO PREVENT CONSTIPATION:  1. FOLLOW A HIGH fiber DIET. AVOID ITEMS THAT CAUSE BLOATING.  2. Use Colace if needed. 3. DRINK ENOUGH WATER TO KEEP URINE LIGHT YELLOW. 4. Use Miralax OR SENNA if needed to keep stools soft.  TO TREAT RECTAL PAIN/BLEEDING: 1. CONTINUE Nitroglycerin ointment -Apply a pea sized amount internally four times daily FOR 3 MOS. 2.  USE APOTHECARY CREAM UP TO FOUR TIMES A DAY THEN IF NEEDED FOR RECTAL PAIN OR DISCOMFORT.    I WILL REFER YOU TO SURGERY FOR RECTAL PAIN/FISSURE IF YOU DO NOT RESPOND TO MEDICAL MANAGEMENT.  FOLLOW UP IN 6 MOS.  PLEASE CALL WITH QUESTIONS OR CONCERNS.

## 2018-05-12 NOTE — Assessment & Plan Note (Signed)
SYMPTOMS FAIRLY WELL CONTROLLED.  CONTINUE TO MONITOR SYMPTOMS. 

## 2018-05-12 NOTE — Assessment & Plan Note (Signed)
SYMPTOMS CONTROLLED/RESOLVED.  CONTINUE TO MONITOR SYMPTOMS. 

## 2018-05-12 NOTE — Assessment & Plan Note (Signed)
WEIGHT STABLE.  CONTINUE TO MONITOR SYMPTOMS. CONTINUE YOUR WEIGHT LOSS EFFORTS. FOLLOW UP IN 6 MOS.
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
## 2018-05-18 ENCOUNTER — Ambulatory Visit (HOSPITAL_COMMUNITY)
Admission: RE | Admit: 2018-05-18 | Discharge: 2018-05-18 | Disposition: A | Discharge status: Home / Self Care | Payer: Medicare Other | Source: Ambulatory Visit | Attending: Family Medicine | Admitting: Family Medicine

## 2018-05-18 DIAGNOSIS — Z1231 Encounter for screening mammogram for malignant neoplasm of breast: Secondary | ICD-10-CM | POA: Insufficient documentation

## 2018-05-18 DIAGNOSIS — Z13820 Encounter for screening for osteoporosis: Secondary | ICD-10-CM | POA: Diagnosis not present

## 2018-05-18 DIAGNOSIS — N951 Menopausal and female climacteric states: Principal | ICD-10-CM

## 2018-05-18 DIAGNOSIS — Z780 Asymptomatic menopausal state: Secondary | ICD-10-CM | POA: Insufficient documentation

## 2018-05-18 DIAGNOSIS — Z1239 Encounter for other screening for malignant neoplasm of breast: Secondary | ICD-10-CM

## 2018-05-19 ENCOUNTER — Encounter: Payer: Self-pay | Admitting: *Deleted

## 2018-06-01 ENCOUNTER — Ambulatory Visit: Payer: Medicare Other | Admitting: Family Medicine

## 2018-07-04 ENCOUNTER — Ambulatory Visit: Payer: Medicare Other | Admitting: Family Medicine

## 2018-07-04 ENCOUNTER — Ambulatory Visit (INDEPENDENT_AMBULATORY_CARE_PROVIDER_SITE_OTHER): Payer: Medicare Other | Admitting: Physician Assistant

## 2018-07-04 ENCOUNTER — Encounter: Payer: Self-pay | Admitting: Physician Assistant

## 2018-07-04 VITALS — BP 122/74 | HR 84 | Temp 98.1°F | Resp 16 | Ht 67.0 in | Wt 215.6 lb

## 2018-07-04 DIAGNOSIS — K589 Irritable bowel syndrome without diarrhea: Secondary | ICD-10-CM

## 2018-07-04 DIAGNOSIS — L239 Allergic contact dermatitis, unspecified cause: Secondary | ICD-10-CM | POA: Diagnosis not present

## 2018-07-04 DIAGNOSIS — J4520 Mild intermittent asthma, uncomplicated: Secondary | ICD-10-CM

## 2018-07-04 DIAGNOSIS — R252 Cramp and spasm: Secondary | ICD-10-CM | POA: Diagnosis not present

## 2018-07-04 DIAGNOSIS — N182 Chronic kidney disease, stage 2 (mild): Secondary | ICD-10-CM | POA: Diagnosis not present

## 2018-07-04 DIAGNOSIS — E785 Hyperlipidemia, unspecified: Secondary | ICD-10-CM | POA: Diagnosis not present

## 2018-07-04 DIAGNOSIS — K601 Chronic anal fissure: Secondary | ICD-10-CM

## 2018-07-04 DIAGNOSIS — Z23 Encounter for immunization: Secondary | ICD-10-CM

## 2018-07-04 DIAGNOSIS — I10 Essential (primary) hypertension: Secondary | ICD-10-CM | POA: Diagnosis not present

## 2018-07-04 DIAGNOSIS — K7581 Nonalcoholic steatohepatitis (NASH): Secondary | ICD-10-CM | POA: Diagnosis not present

## 2018-07-04 DIAGNOSIS — R739 Hyperglycemia, unspecified: Secondary | ICD-10-CM | POA: Diagnosis not present

## 2018-07-04 DIAGNOSIS — K219 Gastro-esophageal reflux disease without esophagitis: Secondary | ICD-10-CM | POA: Diagnosis not present
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
## 2018-07-05 LAB — COMPLETE METABOLIC PANEL WITH GFR
AG Ratio: 1.7 (calc) (ref 1.0–2.5)
ALT: 29 U/L (ref 6–29)
AST: 35 U/L (ref 10–35)
Albumin: 4.4 g/dL (ref 3.6–5.1)
Alkaline phosphatase (APISO): 44 U/L (ref 33–130)
BUN/Creatinine Ratio: 16 (calc) (ref 6–22)
BUN: 18 mg/dL (ref 7–25)
CALCIUM: 10 mg/dL (ref 8.6–10.4)
CO2: 25 mmol/L (ref 20–32)
CREATININE: 1.16 mg/dL — AB (ref 0.50–0.99)
Chloride: 100 mmol/L (ref 98–110)
GFR, EST NON AFRICAN AMERICAN: 49 mL/min/{1.73_m2} — AB (ref >=60)
GFR, Est African American: 57 mL/min/{1.73_m2} — ABNORMAL LOW (ref >=60)
GLOBULIN: 2.6 g/dL (ref 1.9–3.7)
Glucose, Bld: 113 mg/dL — ABNORMAL HIGH (ref 65–99)
Potassium: 4.3 mmol/L (ref 3.5–5.3)
SODIUM: 138 mmol/L (ref 135–146)
Total Bilirubin: 0.8 mg/dL (ref 0.2–1.2)
Total Protein: 7 g/dL (ref 6.1–8.1)

## 2018-07-05 LAB — HEMOGLOBIN A1C
HEMOGLOBIN A1C: 5.4 %{Hb} (ref <5.7)
Mean Plasma Glucose: 108 (calc)
eAG (mmol/L): 6 (calc)

## 2018-07-05 LAB — MAGNESIUM: Magnesium: 1.5 mg/dL (ref 1.5–2.5)

## 2018-07-06 ENCOUNTER — Other Ambulatory Visit: Payer: Self-pay

## 2018-07-06 MED ORDER — METRONIDAZOLE 0.75 % EX CREA: 1.0000 "application " | TOPICAL_CREAM | CUTANEOUS | 3 refills | Status: DC | PRN

## 2018-07-06 MED ORDER — ESTRADIOL 0.1 MG/GM VA CREA: 1.0000 | TOPICAL_CREAM | VAGINAL | 3 refills | Status: DC | PRN

## 2018-07-06 MED ORDER — ALBUTEROL SULFATE HFA 108 (90 BASE) MCG/ACT IN AERS: 2.0000 | INHALATION_SPRAY | RESPIRATORY_TRACT | 3 refills | Status: DC | PRN

## 2018-07-06 MED ORDER — ZOLPIDEM TARTRATE 10 MG PO TABS: ORAL_TABLET | ORAL | 2 refills | Status: DC

## 2018-07-06 MED ORDER — DEXLANSOPRAZOLE 60 MG PO CPDR: 60.0000 mg | DELAYED_RELEASE_CAPSULE | Freq: Every day | ORAL | 3 refills | Status: DC

## 2018-07-06 MED ORDER — ALPRAZOLAM 1 MG PO TABS: 1.0000 mg | ORAL_TABLET | Freq: Two times a day (BID) | ORAL | 0 refills | Status: DC | PRN

## 2018-07-06 MED ORDER — HYDROCHLOROTHIAZIDE 12.5 MG PO TABS: 12.5000 mg | ORAL_TABLET | Freq: Every day | ORAL | 3 refills | Status: DC

## 2018-07-06 MED ORDER — FENOFIBRATE 145 MG PO TABS: 145.0000 mg | ORAL_TABLET | Freq: Every day | ORAL | 3 refills | Status: DC

## 2018-07-06 MED ORDER — BRIMONIDINE TARTRATE 0.2 % OP SOLN: 1.0000 [drp] | Freq: Two times a day (BID) | OPHTHALMIC | 3 refills | Status: DC

## 2018-07-06 MED ORDER — IRBESARTAN 150 MG PO TABS: 75.0000 mg | ORAL_TABLET | Freq: Every day | ORAL | 3 refills | Status: DC

## 2018-07-06 MED ORDER — LATANOPROST 0.005 % OP SOLN: 1.0000 [drp] | Freq: Every day | OPHTHALMIC | 3 refills | Status: DC

## 2018-07-06 MED ORDER — ONDANSETRON HCL 4 MG PO TABS: 4.0000 mg | ORAL_TABLET | Freq: Three times a day (TID) | ORAL | 1 refills | Status: DC | PRN

## 2018-07-06 NOTE — Telephone Encounter (Signed)
Yes. Both are approved.  Can print. I will sign and then can fax.

## 2018-07-06 NOTE — Telephone Encounter (Signed)
Last OV 07/04/2018 OK TO refill? These will be faxed to [facility]

## 2018-07-06 NOTE — Telephone Encounter (Signed)
Prescriptions faxed to [facility]

## 2018-07-08 ENCOUNTER — Other Ambulatory Visit: Payer: Self-pay

## 2018-07-08 MED ORDER — MAGNESIUM OXIDE 400 MG PO TABS: 800.0000 mg | ORAL_TABLET | Freq: Every day | ORAL | Status: DC

## 2018-07-12 ENCOUNTER — Ambulatory Visit (INDEPENDENT_AMBULATORY_CARE_PROVIDER_SITE_OTHER): Payer: Medicare Other | Admitting: Allergy & Immunology

## 2018-07-12 ENCOUNTER — Encounter: Payer: Self-pay | Admitting: Allergy & Immunology

## 2018-07-12 VITALS — BP 110/74 | HR 83 | Temp 98.6°F | Resp 18 | Ht 66.0 in | Wt 216.8 lb

## 2018-07-12 DIAGNOSIS — T781XXD Other adverse food reactions, not elsewhere classified, subsequent encounter: Secondary | ICD-10-CM | POA: Diagnosis not present

## 2018-07-12 DIAGNOSIS — J4540 Moderate persistent asthma, uncomplicated: Secondary | ICD-10-CM | POA: Diagnosis not present

## 2018-07-12 DIAGNOSIS — J310 Chronic rhinitis: Secondary | ICD-10-CM

## 2018-07-12 MED ORDER — BUDESONIDE-FORMOTEROL FUMARATE 160-4.5 MCG/ACT IN AERO: 2.0000 | INHALATION_SPRAY | Freq: Two times a day (BID) | RESPIRATORY_TRACT | 5 refills | Status: DC

## 2018-07-12 NOTE — Patient Instructions (Addendum)
1. Moderate persistent asthma, uncomplicated - Lung testing looked slightly low, but it did improve slightly with the nebulizer treatment. - With your frequent use of albuterol, I think a daily controller medication is needed: Symbicort  - Symbicort contains a long-acting albuterol combined with an inhaled steroid. - Spacer sample and demonstration provided. - Daily controller medication(s): Symbicort 160/4.68mg two puffs twice daily with spacer - Prior to physical activity: ProAir 2 puffs 10-15 minutes before physical activity. - Rescue medications: ProAir 4 puffs every 4-6 hours as needed or albuterol nebulizer one vial every 4-6 hours as needed - Asthma control goals:  * Full participation in all desired activities (may need albuterol before activity) * Albuterol use two time or less a week on average (not counting use with activity) * Cough interfering with sleep two time or less a month * Oral steroids no more than once a year * No hospitalizations  2. Chronic rhinitis - Testing today showed: negative to the entire panel (we will confirm with blood testing) - Continue with: Zyrtec (cetirizine) 145mtablet once daily as needed in combination with nasal saline rinses as needed. - Nasal sprays could be a consideration, but I know that you have not tolerated these in the past.  - Once we get the blood work back, we can discuss the possibility of adding allergy shots (if there are positive findings on the testing).  3. Adverse food reaction -Testing was negative to peanuts, milk, egg, tree nuts, scallops, tu[place]uwaitchicken, tomato, and banana. -We will get blood testing to confirm this. -The severity of your reactions are fairly concerning, so I would like to send an epinephrine autoinjector to have on hand just in case.  4. Return in about 4 weeks (around 08/09/2018).   Please inform usKoreaf any Emergency Department visits, hospitalizations, or changes in symptoms. Call usKoreaefore going to  the ED for breathing or allergy symptoms since we might be able to fit you in for a sick visit. Feel free to contact usKoreanytime with any questions, problems, or concerns.  It was a pleasure to meet you today!  Websites that have reliable patient information: 1. American Academy of Asthma, Allergy, and Immunology: www.aaaai.org 2. Food Allergy Research and Education (FARE): foodallergy.org 3. Mothers of Asthmatics: http://www.asthmacommunitynetwork.org 4. American College of Allergy, Asthma, and Immunology: wwMonthlyElectricBill.co.uk Make sure you are registered to vote! If you have moved or changed any of your contact information, you will need to get this updated before voting!

## 2018-07-12 NOTE — Progress Notes (Signed)
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 Moderate persistent asthma, uncomplicated - Lung testing looked slightly low, but it did improve slightly with the nebulizer treatment. - With your frequent use of albuterol, I think a daily controller medication is needed: Symbicort  - Symbicort contains a long-acting albuterol combined with an inhaled steroid.  - We could consider the use of an anti-IL5 medication in the future if needed (last AEC was 421 in May 2019) - Spacer sample and demonstration provided. - Daily controller medication(s): Symbicort 160/4.62mg two puffs twice daily with spacer - Prior to physical activity: ProAir 2 puffs 10-15 minutes before physical activity. - Rescue medications: ProAir 4 puffs every 4-6 hours as needed or albuterol nebulizer one vial every 4-6 hours as needed - Asthma control goals:  * Full participation in all desired activities (may need albuterol before activity) * Albuterol use two time or less a week on average (not counting use with activity) * Cough interfering with sleep two time or less a month * Oral steroids no more than once a year * No hospitalizations  2. Chronic rhinitis - Testing today showed: negative to the entire panel (we will confirm with blood testing) - Continue with: Zyrtec (cetirizine) 175mtablet once daily as needed in combination with nasal saline rinses as needed. - Nasal sprays could be a consideration, but I know that you have not tolerated these in the past.  - Once we get the blood work back, we can discuss the possibility of adding allergy shots (if there are positive findings on the testing).  3. Adverse food  reaction -Testing was negative to peanuts, milk, egg, tree nuts, scallops, tuKuwaitchicken, tomato, and banana. -We will get blood testing to confirm this. -The severity of your reactions are fairly concerning, so I would like to send an epinephrine autoinjector to have on hand just in case.  4. Return in about 4 weeks (around 08/09/2018).   Subjective:   RuJANEIL SCHEXNAYDERs a [age].o. female presenting today for evaluation of  Chief Complaint  Patient presents with  . Nasal Congestion    coughing, chest congestion    RuMEENA BARRANTESas a history of the following: Patient Active Problem List   Diagnosis Date Noted  . Hyperglycemia 07/04/2018  . Anal or rectal pain 08/11/2017  . 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 Rectal pain 08/12/2016  . Rosacea 09/04/2015  . Constipation   . PE (pulmonary thromboembolism) ([facility]10/21/2016  . Back pain with sciatica 02/13/2015  . Depression 02/12/2015  . Vaginal atrophy 11/14/2014  . Grief reaction 03/20/2014  . Hearing loss 11/15/2013  . Hip pain 09/05/2013  . Knee pain 09/05/2013  . Lung nodule 08/14/2013  . Nodule of tendon sheath 08/14/2013  . Hemorrhoids, internal, with bleeding 04/05/2013  . Hyperlipidemia 02/28/2013  . Nonalcoholic steatohepatitis (NASH) 02/28/2013  . Essential hypertension, benign 02/07/2013  . Obesity 02/07/2013  . GERD (gastroesophageal reflux disease) 02/07/2013  . Hematuria 09/21/2012  . 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 Asthma 08/25/2012  . Incontinence overflow, stress female 08/25/2012  . Anxiety 08/25/2012  . Chronic insomnia 08/25/2012  . Irritable bowel syndrome 12/10/2010    History obtained  from: chart review and patient.  Alvie Heidelberg Kleiner was referred by Orlena Sheldon, PA-C.     [name] is a 67 y.o. very verbose female presenting for sinuses and not "being able to breathe". However, she has a multitude of other complaints as well.   Asthma/Respiratory Symptom  History: She has had more problems with her lungs since winter ended, but she has done better over the last two weeks. She is on an inhaler and she has a nebulizer. She has been using these around twice daily, but typically less than that. She has been unable to breathe. She also has problems with fumes from chemical exposures. She has been on Advair in the past, but she stopped taking it years ago for unknown reasons. She does endorse nightly coughing. She reports that she ends up with a lot of phlegm and she wakes up choking in the middle of the night. The coughing that comes with it becomes fairly severe and she becomes afraid to cough because her lungs become so irritated that "they fill up with liquid". She estimates that she is prescribed every time that she "gets pneumonia". With these breathing problems, she has had pleurisy 20 times and bronchitis around 50 times. She moved to [place] in 1991 and has had bronchitis around 20 times since then. Absolute eosinophil count is 421 as of May 2019.   She does have a history of a chemical exposure when she was working in the 1970s in [place]. She was working and exposed to trichloroethylene. 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 [employer] actually become involved and she ended up getting settlement money from this exposure. She worked there for only three months before she became so sick that she could not work. She was in her early to mid 33s at the time.   Allergic Rhinitis Symptom History: She does have sneezing and itchy watery eyes. This is throughout the year. She uses nothing for these symptoms and uses nothing in particular. Prior to her husband passing, she got rid of the cat and she had the hardwood floors replaced in the home. There is still some carpeting in the spare bedroom and the office, which she never uses. She has done a lot of work to alleviate the allergen levels in the home including duct work cleaning. She does use nasal saline  rinses. Nose sprays tend to cause throat burning and overdrying. She tries to stay away  From antihistamines, which tend to cause her to be wired up.   Food Allergy Symptom History: Charman reports that she has a constellation of symptoms to various foods.  She has vomiting which she describes as violent to scallops, bananas, and peanuts.  She experiences throat closure with tree nuts.  With chicken and tomato she developed swelling and redness on her face.  She has GI upset with eggs.  Milk seems to cause some stomach pain, although she can tolerate hard cheeses such as Parmesan and Jarlsberg without a problem.  She also tolerates brie.  She can eat flaxseed, rice, wheat (but not a lot), soy, sesame, and seafood without a problem.  Fruits can make her "queasy" but she is unable to elaborate. She does not have an EpiPen.   She does have a history of a pulmonary embolism in 2016. She was on aspirin but she does not take regularly due to G6PD. She doe use a baby aspirin when she has chest pain, at which time she also takes nitroglycerin and  alprazolam. 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 She is followed by Dr. Lyman Bishop.   She was placed on Dexilant due to abdominal pain. She felt that this was related to her husband's passing (passed in 2015). She tends to go to [place] for months on end to stay with her son and she stays with a friend in [place] for months at a time. [place]   Otherwise, there is no history of other atopic diseases, including drug allergies, stinging insect allergies, eczema or urticaria. There is no significant infectious history. Vaccinations are up to date.    Past Medical History: Patient Active Problem List   Diagnosis Date Noted  . Hyperglycemia 07/04/2018  . Anal or rectal pain 08/11/2017  . 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 Rectal pain 08/12/2016  . Rosacea 09/04/2015  .  Constipation   . PE (pulmonary thromboembolism) ([facility]) 07/26/2015  . Back pain with sciatica 02/13/2015  . Depression 02/12/2015  . Vaginal atrophy 11/14/2014  . Grief reaction 03/20/2014  . Hearing loss 11/15/2013  . Hip pain 09/05/2013  . Knee pain 09/05/2013  . Lung nodule 08/14/2013  . Nodule of tendon sheath 08/14/2013  . Hemorrhoids, internal, with bleeding 04/05/2013  . Hyperlipidemia 02/28/2013  . Nonalcoholic steatohepatitis (NASH) 02/28/2013  . Essential hypertension, benign 02/07/2013  . Obesity 02/07/2013  . GERD (gastroesophageal reflux disease) 02/07/2013  . Hematuria 09/21/2012  . 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 Asthma 08/25/2012  . Incontinence overflow, stress female 08/25/2012  . Anxiety 08/25/2012  . Chronic insomnia 08/25/2012  . Irritable bowel syndrome 12/10/2010    Medication List:  Allergies as of 07/12/2018      Reactions   Adhesive [tape] Other (See Comments)   Blisters skin   Aspirin Other (See Comments)   sickle cell trait--  Not recommended unless emergency   Keflex [cephalexin] Hives   Levaquin [levofloxacin In D5w]    Altered mental status   Other    Nut Oil- skin irritation   Statins Other (See Comments)   Myopathy - elevated CK   Sulfonamide Derivatives Other (See Comments)   Patient has sickle cell trait   Latex Rash      Medication List        Accurate as of 07/12/18  1:05 PM. Always use your most recent med list.          albuterol (2.5 MG/3ML) 0.083% nebulizer solution Commonly known as:  PROVENTIL Take 3 mLs (2.5 mg total) by nebulization every 4 (four) hours as needed for wheezing or shortness of breath.   albuterol 108 (90 Base) MCG/ACT inhaler Commonly known as:  PROVENTIL HFA;VENTOLIN HFA Inhale 2 puffs into the lungs every 4 (four) hours as needed for wheezing.   ALIVE WOMENS 50+ PO Take 1 tablet by mouth daily.   ALPRAZolam 1 MG tablet Commonly known as:  XANAX Take 1 tablet (1 mg total) by  mouth 2 (two) times daily as needed for anxiety.   BIOFREEZE EX Apply topically daily as needed (pain).   brimonidine 0.2 % ophthalmic solution Commonly known as:  ALPHAGAN Place 1 drop into both eyes 2 (two) times daily.   budesonide-formoterol 160-4.5 MCG/ACT inhaler Commonly known as:  SYMBICORT Inhale 2 puffs into the lungs 2 (two) times daily.   cyclobenzaprine 10 MG tablet Commonly known as:  FLEXERIL TAKE ONE TABLET BY MOUTH DAILY  as needed muscle  spasms   dexlansoprazole 60 MG capsule Commonly known as:  DEXILANT Take 1 capsule (60 mg total) by mouth daily.   estradiol 0.1 MG/GM vaginal cream Commonly known as:  ESTRACE Place 1 Applicatorful vaginally as needed.   fenofibrate 145 MG tablet Commonly known as:  TRICOR Take 1 tablet (145 mg total) by mouth daily.   hydrochlorothiazide 12.5 MG tablet Commonly known as:  HYDRODIURIL Take 1 tablet (12.5 mg total) by mouth daily.   irbesartan 150 MG tablet Commonly known as:  AVAPRO Take 0.5 tablets (75 mg total) by mouth daily.   latanoprost 0.005 % ophthalmic solution Commonly known as:  XALATAN Place 1 drop into both eyes at bedtime.   Lidocaine-Hydrocortisone Ace 3-2.5 % Kit APPLY TO RECTUM QID for 2 weeks then AS NEEDED FOR RECTAL PAIN OR BLEEDING   Lifitegrast 5 % Soln Place 1 drop into both eyes 2 (two) times daily.   magnesium oxide 400 MG tablet Commonly known as:  MAG-OX Take 2 tablets (800 mg total) by mouth daily.   meclizine 12.5 MG tablet Commonly known as:  ANTIVERT Take 1 tablet (12.5 mg total) by mouth 3 (three) times daily as needed for dizziness.   metroNIDAZOLE 0.75 % cream Commonly known as:  METROCREAM Apply 1 application topically as needed.   neomycin-polymyxin b-dexamethasone 3.5-10000-0.1 Oint Commonly known as:  MAXITROL Place 1 application into both eyes at bedtime as needed.   Nitroglycerin 0.4 % Oint PEA SIZE AMOUNT ON COTTON TIP APPLICATOR QID FOR 2 MOS. APPLY TO ANAL  CANAL.   Nitroglycerin 0.4 % Oint PLACE A SMALL AMOUNT ON Q TIP AND INSERT IN ANAL CANAL QID FOR 3 MOS.   nitroGLYCERIN 0.4 MG/SPRAY spray Commonly known as:  NITROLINGUAL Place 1 spray under the tongue every 5 (five) minutes as needed for chest pain.   NON FORMULARY Take 1 tablet by mouth daily as needed (bloat). FD GUARD   ondansetron 4 MG tablet Commonly known as:  ZOFRAN Take 1 tablet (4 mg total) by mouth every 8 (eight) hours as needed for nausea or vomiting.   PROBIOTIC DAILY PO Take 1 capsule by mouth daily.   senna 8.6 MG Tabs tablet Commonly known as:  SENOKOT Take 1 tablet by mouth at bedtime.   Vitamin D2 2000 units Tabs Take 2,000 Units by mouth daily.   zolpidem 10 MG tablet Commonly known as:  AMBIEN TAKE ONE TABLET BY MOUTH DAILY AT BEDTIME.       Birth History: non-contributory  Developmental History: non-contributory.   Past Surgical History: Past Surgical History:  Procedure Laterality Date  . ABDOMINAL HYSTERECTOMY    . ADENOIDECTOMY    . ANGIOPLASTY    . APPENDECTOMY    . BIOPSY N/A 03/14/2013   Procedure: SMALL BOWEL AND GASTRIC BIOPSIES (Procedure #1);  Surgeon: Danie Binder, MD;  Location: [facility];  Service: Endoscopy;  Laterality: N/A;  . CARDIAC CATHETERIZATION Left 02/11/2000  . CATARACT EXTRACTION Left   . CHOLECYSTECTOMY  12/2007  . COLONOSCOPY  12/22/10   SNK:NLZJQB, cecal adenomatous polyp  . COLONOSCOPY WITH PROPOFOL N/A 03/31/2016   Procedure: COLONOSCOPY WITH PROPOFOL;  Surgeon: Danie Binder, MD;  Location: [facility];  Service: Endoscopy;  Laterality: N/A;  115 - to 1:00  . complete hysterectomy    . ENTEROSCOPY N/A 03/14/2013   HAL:PFXT gastritis/ulcers has healed  . ESOPHAGOGASTRODUODENOSCOPY  09/22/2011   KWI:OXBD gastritis/Duodenitis  . FLEXIBLE SIGMOIDOSCOPY N/A 03/14/2013   SLF:3 colon polyp removed/moderate sized internal hemorrhoids  .  FLEXIBLE SIGMOIDOSCOPY N/A 06/09/2016   Procedure: FLEXIBLE SIGMOIDOSCOPY;   Surgeon: Danie Binder, MD;  [facility];  Service: Endoscopy;  Laterality: N/A;  rectal polyps times 2  . FLEXIBLE SIGMOIDOSCOPY N/A 03/18/2018   Procedure: FLEXIBLE SIGMOIDOSCOPY;  Surgeon: Danie Binder, MD;  [facility];  Service: Endoscopy;  Laterality: N/A;  9:15am  . FOOT SURGERY     bunion removal left  . GIVENS CAPSULE STUDY N/A 02/10/2013   Procedure: GIVENS CAPSULE STUDY;  Surgeon: Danie Binder, MD;  [facility];  Service: Endoscopy;  Laterality: N/A;  730  . HEEL SPUR EXCISION     right   . HEMORRHOID BANDING N/A 03/14/2013   Procedure: HEMORRHOID BANDING (Procedure #3)  3 bands applied DTO#[id number] Exp 02/02/2014 ;  Surgeon: Danie Binder, MD;  Location: [facility];  Service: Endoscopy;  Laterality: N/A;  . HEMORRHOID SURGERY N/A 04/24/2016   Procedure: EXTENSIVE HEMORRHOIDECTOMY;  Surgeon: Vickie Epley, MD;  Location: [facility];  Service: General;  Laterality: N/A;  . LAPAROSCOPY     adhesions  . POLYPECTOMY N/A 03/14/2013   DXI:PJAS Gastritis . ULCERS SEEN ON MAY 6 HAVE HEALED  . POLYPECTOMY  03/31/2016   Procedure: POLYPECTOMY;  Surgeon: Danie Binder, MD;  [facility];  Service: Endoscopy;;  sigmoid colon polyps x2, rectal polyps x2  . TONSILLECTOMY    . TUBAL LIGATION       Family History: Family History  Problem Relation Age of Onset  . Colon cancer Mother        44s  . Urticaria Mother   . Cancer Father        oral  . Crohn's disease Sister   . Colon cancer Maternal Grandfather   . Colon cancer Paternal Grandfather   . Diabetes Brother   . Colon cancer Maternal Grandmother   . Anesthesia problems Neg Hx      Social History: Ryann lives at home by herself. Her husband passed away in 2013/12/15 from prostate cancer.  She has 1 son from a previous marriage.  She lived in [place] until [place] when she moved to [place].  She lives in a house with wood throughout the home.  She has electric heating and central  cooling.  There are no animals inside or outside the home.  She does have dust mite coverings on her bedding.  There is no tobacco exposure.  She did smoke on and off for around 25 years, but only around 2 cigarettes/day.  She stopped at age 32.    Review of Systems: a 14-point review of systems is pertinent for what is mentioned in HPI.  Otherwise, all other systems were negative. Constitutional: negative other than that listed in the HPI Eyes: negative other than that listed in the HPI Ears, nose, mouth, throat, and face: negative other than that listed in the HPI Respiratory: negative other than that listed in the HPI Cardiovascular: negative other than that listed in the HPI Gastrointestinal: negative other than that listed in the HPI Genitourinary: negative other than that listed in the HPI Integument: negative other than that listed in the HPI Hematologic: negative other than that listed in the HPI Musculoskeletal: negative other than that listed in the HPI Neurological: negative other than that listed in the HPI Allergy/Immunologic: negative other than that listed in the HPI    Objective:   There were no vitals taken for this visit. There is no height or weight  on file to calculate BMI.   Physical Exam:  General: Alert, interactive, in no acute distress.  Eyes: No conjunctival injection bilaterally, no discharge on the right, no discharge on the left and no Horner-Trantas dots present. PERRL bilaterally. EOMI without pain. No photophobia.  Ears: Right TM pearly gray with normal light reflex, Left TM pearly gray with normal light reflex, Right TM intact without perforation and Left TM intact without perforation.  Nose/Throat: External nose within normal limits and septum midline. Turbinates edematous and pale with clear discharge. Posterior oropharynx erythematous with cobblestoning in the posterior oropharynx. Tonsils 2+ without exudates.  Tongue without thrush. Neck: Supple  without thyromegaly. Trachea midline. Adenopathy: no enlarged lymph nodes appreciated in the anterior cervical, occipital, axillary, epitrochlear, inguinal, or popliteal regions. Lungs: Clear to auscultation without wheezing, rhonchi or rales. No increased work of breathing. CV: Normal S1/S2. No murmurs. Capillary refill <2 seconds.  Abdomen: Nondistended, nontender. No guarding or rebound tenderness. Bowel sounds present in all fields and hypoactive  Skin: Warm and dry, without lesions or rashes. Extremities:  No clubbing, cyanosis or edema. Neuro:   Grossly intact. No focal deficits appreciated. Responsive to questions.  Diagnostic studies:  Spirometry: results abnormal (FEV1: 1.75/93%, FVC: 1.97/76%, FEV1/FVC: 89%).    Spirometry consistent with possible restrictive disease. Xopenex/Atrovent nebulizer treatment given in clinic with significant improvement in FVC per ATS criteria.  Allergy Studies:   Indoor/Outdoor Percutaneous Adult Environmental Panel: negative to the entire panel with adequate controls.  Selected Food Panel: negative to Peanut, Milk, Egg, Casein, Cashew, Pecan, [place], [place], [place], [place] nut, [place], [place], [place], [place], Chicken, Tomato and Banana    Allergy testing results were read and interpreted by myself, documented by clinical staff.       Salvatore Marvel, MD [facility]

## 2018-07-26 ENCOUNTER — Telehealth: Payer: Self-pay

## 2018-07-26 NOTE — Telephone Encounter (Signed)
Pt called stating she was charged for a spacer that was sent to [facility]. I spoke to [facility] and they stated because she has Medicare it will not cover but she will send over a medical reason to her secondary to see if they will cover it.   Acct# [id number]  [facility] # [phone]

## 2018-08-10 ENCOUNTER — Ambulatory Visit: Payer: Medicare Other | Admitting: Allergy & Immunology

## 2018-08-15 ENCOUNTER — Telehealth: Payer: Self-pay | Admitting: Orthopaedic Surgery

## 2018-08-15 NOTE — Telephone Encounter (Signed)
Call received from patient - states she had a fall about one and a half weeks ago on her concrete garage floor; said injured her left hand, especially small finger; also said she hurt ribs, which I relayed orthopaedist does not treat. Offered appointment for this week; states has had no treatment yet.  First declined, said will call primary care, then agreed to schedule.

## 2018-08-17 ENCOUNTER — Ambulatory Visit: Payer: Medicare Other | Admitting: Orthopaedic Surgery

## 2018-08-25 ENCOUNTER — Telehealth: Payer: Self-pay | Admitting: *Deleted

## 2018-08-25 DIAGNOSIS — D573 Sickle-cell trait: Secondary | ICD-10-CM | POA: Diagnosis not present

## 2018-08-25 DIAGNOSIS — M549 Dorsalgia, unspecified: Secondary | ICD-10-CM | POA: Diagnosis not present

## 2018-08-25 DIAGNOSIS — R0781 Pleurodynia: Secondary | ICD-10-CM | POA: Diagnosis not present

## 2018-08-25 DIAGNOSIS — M79632 Pain in left forearm: Secondary | ICD-10-CM | POA: Diagnosis not present

## 2018-08-25 DIAGNOSIS — S5002XA Contusion of left elbow, initial encounter: Secondary | ICD-10-CM | POA: Diagnosis not present

## 2018-08-25 DIAGNOSIS — M546 Pain in thoracic spine: Secondary | ICD-10-CM | POA: Diagnosis not present

## 2018-08-25 DIAGNOSIS — R918 Other nonspecific abnormal finding of lung field: Secondary | ICD-10-CM | POA: Diagnosis not present

## 2018-08-25 DIAGNOSIS — S60222A Contusion of left hand, initial encounter: Secondary | ICD-10-CM | POA: Diagnosis not present

## 2018-08-25 DIAGNOSIS — W010XXA Fall on same level from slipping, tripping and stumbling without subsequent striking against object, initial encounter: Secondary | ICD-10-CM | POA: Diagnosis not present

## 2018-08-25 DIAGNOSIS — R55 Syncope and collapse: Secondary | ICD-10-CM | POA: Diagnosis not present

## 2018-08-25 DIAGNOSIS — I10 Essential (primary) hypertension: Secondary | ICD-10-CM | POA: Diagnosis not present

## 2018-08-25 DIAGNOSIS — R251 Tremor, unspecified: Secondary | ICD-10-CM | POA: Diagnosis not present

## 2018-08-25 DIAGNOSIS — I209 Angina pectoris, unspecified: Secondary | ICD-10-CM | POA: Diagnosis not present

## 2018-08-25 DIAGNOSIS — Z9049 Acquired absence of other specified parts of digestive tract: Secondary | ICD-10-CM | POA: Diagnosis not present

## 2018-08-25 DIAGNOSIS — M47894 Other spondylosis, thoracic region: Secondary | ICD-10-CM | POA: Diagnosis not present

## 2018-08-25 DIAGNOSIS — M25512 Pain in left shoulder: Secondary | ICD-10-CM | POA: Diagnosis not present

## 2018-08-25 DIAGNOSIS — M545 Low back pain: Secondary | ICD-10-CM | POA: Diagnosis not present

## 2018-08-25 DIAGNOSIS — M25522 Pain in left elbow: Secondary | ICD-10-CM | POA: Diagnosis not present

## 2018-08-25 DIAGNOSIS — R110 Nausea: Secondary | ICD-10-CM | POA: Diagnosis not present

## 2018-08-25 DIAGNOSIS — M25532 Pain in left wrist: Secondary | ICD-10-CM | POA: Diagnosis not present

## 2018-08-25 NOTE — Telephone Encounter (Addendum)
Received call from patient.   Reports that she had fall at home a few days prior. States that she was in garage and did landed on her back on the concrete. States that she did not think she had injured anything at that time.   Reports that she and her sister-in-law drove from [place] to [place] yesterday so that she could fly out to [place]. Reports that she was stiff after 2hr drive, but she could move. States that she slept in hotel last night and woke up today sore. States that she thinks car ride exacerbated soreness. States that she couldn't move without pain.  Reports that pain is from under her breasts down to the arches of her feet. States that she is cramping and can barely walk.   Patient has Hx of PE. Advised to go to ER for evaluation. States that she will go to ER in [place] and F/U with PCP. Patient reports that she has cancelled her trip to [place].

## 2018-08-26 ENCOUNTER — Other Ambulatory Visit: Payer: Self-pay | Admitting: *Deleted

## 2018-08-26 NOTE — Telephone Encounter (Signed)
Agree with above, pt to local ER

## 2018-09-07 ENCOUNTER — Other Ambulatory Visit: Payer: Self-pay

## 2018-09-07 ENCOUNTER — Encounter: Payer: Self-pay | Admitting: Family Medicine

## 2018-09-07 ENCOUNTER — Ambulatory Visit (INDEPENDENT_AMBULATORY_CARE_PROVIDER_SITE_OTHER): Payer: Medicare Other | Admitting: Family Medicine

## 2018-09-07 VITALS — BP 112/68 | HR 82 | Temp 98.4°F | Resp 14 | Ht 66.0 in | Wt 217.0 lb

## 2018-09-07 DIAGNOSIS — G44329 Chronic post-traumatic headache, not intractable: Secondary | ICD-10-CM

## 2018-09-07 DIAGNOSIS — S61419A Laceration without foreign body of unspecified hand, initial encounter: Secondary | ICD-10-CM | POA: Diagnosis not present

## 2018-09-07 DIAGNOSIS — M51369 Other intervertebral disc degeneration, lumbar region without mention of lumbar back pain or lower extremity pain: Secondary | ICD-10-CM

## 2018-09-07 DIAGNOSIS — M549 Dorsalgia, unspecified: Secondary | ICD-10-CM | POA: Diagnosis not present

## 2018-09-07 DIAGNOSIS — F0781 Postconcussional syndrome: Secondary | ICD-10-CM | POA: Diagnosis not present

## 2018-09-07 DIAGNOSIS — W19XXXD Unspecified fall, subsequent encounter: Secondary | ICD-10-CM | POA: Diagnosis not present

## 2018-09-07 DIAGNOSIS — S66929A Laceration of unspecified muscle, fascia and tendon at wrist and hand level, unspecified hand, initial encounter: Secondary | ICD-10-CM | POA: Diagnosis not present

## 2018-09-07 DIAGNOSIS — M5136 Other intervertebral disc degeneration, lumbar region: Secondary | ICD-10-CM | POA: Diagnosis not present

## 2018-09-07 MED ORDER — HYDROCODONE-ACETAMINOPHEN 5-325 MG PO TABS: 1.0000 | ORAL_TABLET | Freq: Four times a day (QID) | ORAL | 0 refills | Status: DC | PRN

## 2018-09-07 NOTE — Patient Instructions (Addendum)
Take the hydrocodone Use the nausea medication Referral to hand surgeon MRI to be done CT head F/U pending    Concussion, Adult A concussion is a brain injury from a direct hit (blow) to the head or body. This injury causes the brain to shake quickly back and forth inside the skull. It is caused by:  A hit to the head.  A quick and sudden movement (jolt) of the head or neck.  How fast you will get better from a concussion depends on many things like how bad your concussion was, what part of your brain was hurt, how old you are, and how healthy you were before the concussion. Recovery can take time. It is important to wait to return to activity until a doctor says it is safe and your symptoms are all gone. Follow these instructions at home: Activity  Limit activities that need a lot of thought or concentration. These include: ? Homework or work for your job. ? Watching TV. ? Computer work. ? Playing memory games and puzzles.  Rest. Rest helps the brain to heal. Make sure you: ? Get plenty of sleep at night. Do not stay up late. ? Go to bed at the same time every day. ? Rest during the day. Take naps or rest breaks when you feel tired.  It can be dangerous if you get another concussion before the first one has healed Do not do activities that could cause a second concussion, such as riding a bike or playing sports.  Ask your doctor when you can return to your normal activities, like driving, riding a bike, or using machinery. Your ability to react may be slower. Do not do these activities if you are dizzy. Your doctor will likely give you a plan for slowly going back to activities. General instructions  Take over-the-counter and prescription medicines only as told by your doctor.  Do not drink alcohol until your doctor says you can.  If it is harder than usual to remember things, write them down.  If you are easily distracted, try to do one thing at a time. For example, do not  try to watch TV while making dinner.  Talk with family members or close friends when you need to make important decisions.  Watch your symptoms and tell other people to do the same. Other problems (complications) can happen after a concussion. Older adults with a brain injury may have a higher risk of serious problems, such as a blood clot in the brain.  Tell your teachers, school nurse, school counselor, coach, Product/process development scientist, or work Freight forwarder about your injury and symptoms. Tell them about what you can or cannot do. They should watch for: ? More problems with attention or concentration. ? More trouble remembering or learning new information. ? More time needed to do tasks or assignments. ? Being more annoyed (irritable) or having a harder time dealing with stress. ? Any other symptoms that get worse.  Keep all follow-up visits as told by your health care provider. This is important. Prevention  It is very important that you donot get another brain injury, especially before you have healed. In rare cases, another injury can cause permanent brain damage, brain swelling, or death. You have the most risk if you get another head injury in the first 7-10 days after you were hurt before. To avoid injuries: ? Wear a seat belt when you ride in a car. ? Do not drink too much alcohol. ? Avoid activities that  could make you get a second concussion, like contact sports. ? Wear a helmet when you do activities like:  Biking.  Skiing.  Skateboarding.  Skating. ? Make your home safe by:  Removing things from the floor or stairs that could make you trip.  Using grab bars in bathrooms and handrails by stairs.  Placing non-slip mats on floors and in bathtubs.  Putting more light in dark areas. Contact a doctor if:  Your symptoms get worse.  You have new symptoms.  You keep having symptoms for more than 2 weeks. Get help right away if:  You have bad headaches, or your headaches get  worse.  You have weakness in any part of your body.  You have loss of feeling (numbness).  You feel off balance.  You keep throwing up (vomiting).  You feel more sleepy.  The black center of one eye (pupil) is bigger than the other one.  You twitch or shake violently (convulse) or have a seizure.  Your speech is not clear (is slurred).  You feel more tired, more confused, or more annoyed.  You do not recognize people or places.  You have neck pain.  It is hard to wake you up.  You have strange behavior changes.  You pass out (lose consciousness). Summary  A concussion is a brain injury from a direct hit (blow) to the head or body.  This condition is treated with rest and careful watching of symptoms.  If you keep having symptoms for more than 2 weeks, call your doctor. This information is not intended to replace advice given to you by your health care provider. Make sure you discuss any questions you have with your health care provider. Document Released: 09/09/2009 Document Revised: 09/05/2016 Document Reviewed: 09/05/2016 Elsevier Interactive Patient Education  2017 [place] Syndrome Post-concussion syndrome is the symptoms that can occur after a head injury. These symptoms can last from weeks to months. Follow these instructions at home:  Take medicines only as told by your doctor.  Do not take aspirin.  Sleep with your head raised to help with headaches.  Avoid activities that can cause another head injury. ? Do not play contact sports like football, hockey, soccer, or basketball. ? Do not do other risky activities like downhill skiing, martial arts, or horseback riding until your doctor says it is okay.  Keep all follow-up visits as told by your doctor. This is important. Contact a doctor if:  You have a harder time: ? Paying attention. ? Focusing. ? Remembering. ? Learning new information. ? Dealing with stress.  You need more  time to complete tasks.  You are easily bothered (irritable).  You have more symptoms. Get help if you have any of these symptoms for more than two weeks after your injury:  Long-lasting (chronic) headaches.  Dizziness.  Trouble balancing.  Feeling sick to your stomach (nauseous).  Trouble with your vision.  Noise or light bothers you more.  Depression.  Mood swings.  Feeling worried (anxious).  Easily bothered.  Memory problems.  Trouble concentrating or paying attention.  Sleep problems.  Feeling tired all of the time.  Get help right away if:  You feel confused.  You feel very sleepy.  You are hard to wake up.  You feel sick to your stomach.  You keep throwing up (vomiting).  You feel like you are moving when you are not (vertigo).  Your eyes move back and forth very quickly.  You start shaking (  convulsing) or pass out (faint).  You have very bad headaches that do not get better with medicine.  You cannot use your arms or legs like normal.  One of the black centers of your eyes (pupils) is bigger than the other.  You have clear or bloody fluid coming from your nose or ears.  Your problems get worse, not better. This information is not intended to replace advice given to you by your health care provider. Make sure you discuss any questions you have with your health care provider. Document Released: 10/29/2004 Document Revised: 02/27/2016 Document Reviewed: 12/27/2013 Elsevier Interactive Patient Education  2018 [employer].

## 2018-09-07 NOTE — Progress Notes (Signed)
Subjective:    Patient ID: [name], female    DOB: May 09, 1951, 67 y.o.   MRN: [id number]  Patient presents for ER F/U (S/P fall 07/2018- back pain increaed and pt finally went to ER on 08/25/2018.)   Pt here s/p Fall in October. She slipped on Water on concrete in her garage hit from low back up to her neck region. She states everything went black. She did not seek care at that time. She was leaving to go on vacation on 11/21 she rode from [place] to [place] and [place] having severe pain between her legs and was numbness and difficulty urinating   She hurt her left hand and cut the center of hand, state she saw tendons but didn't seek care, used home bandainds and a split, and now cant open a jar, cant make a fist, her 5th digit has severe pain and she feels weak in her hand   the pain goes up her left forearm to her elbow  She is left handed  She has also been nauseous since the fall. Continues to have headaches daily   Reviewed ER note from [place]- no acute abnormality of spine, lumbar or thoracic   Shoulder xray- Ribs/Arm/Hand - normal except for arthritis   Given zofran, fentyl, toradol    Continued severe back pain, still has numbness, riht knee gives out on her feels numb   Bowels and urinating are back to normal   Has pain all throughout her spine    Review Of Systems:  GEN- denies fatigue, fever, weight loss,weakness, recent illness HEENT- denies eye drainage, change in vision, nasal discharge, CVS- denies chest pain, palpitations RESP- denies SOB, cough, wheeze ABD- denies N/V, change in stools, abd pain GU- denies dysuria, hematuria, dribbling, incontinence MSK- + joint pain,+ muscle aches, injury Neuro- + headache, dizziness, syncope, seizure activity       Objective:    BP 112/68   Pulse 82   Temp 98.4 F (36.9 C) (Oral)   Resp 14   Ht 5' 6"  (1.676 m)   Wt 217 lb (98.4 kg)   SpO2 97%   BMI 35.02 kg/m  GEN- NAD, alert and oriented  x3 HEENT- PERRL, EOMI, non injected sclera, pink conjunctiva, MMM, oropharynx clear Neck- Supple, no thyromegaly CVS- RRR, no murmur RESP-CTAB ABD-NABS,soft,NT,ND MSK- TTP c spine to lumbar spine, + spasm paraspinals, +SLR bilat, decreased ROM spine/Hips  Left hand, TTP between 4th and 5th digits, unable to make complete fist, decreased strength in 5th digit Neuro- normal tone, antalgic gait, sensation grossly in tact, strength equal bilat LE, CNII-XII in tact EXT- No edema Pulses- Radial 2+        Assessment & Plan:      Problem List Items Addressed This Visit    None    Visit Diagnoses    Fall, subsequent encounter    -  Primary   Relevant Orders   CT Head Wo Contrast   MR Lumbar Spine Wo Contrast   Post concussion syndrome       CContiuned severe headaches, CT of head to be done, pain meds, has anti-emetic   Relevant Orders   CT Head Wo Contrast   Hand laceration involving tendon, initial encounter       Pt did not seek care, concern she had tendon injury with the laceration, referral to hand surgeon, weakness in left dominant hand   Relevant Orders   Ambulatory referral to Hand Surgery  DDD (degenerative disc disease), lumbar       Known DDD but with bowel changes after fall, radicular symptoms, obtain MRI lumbar spine,norco for pain. R/O nerve impignment, compressio fracture   Relevant Medications   HYDROcodone-acetaminophen (NORCO) 5-325 MG tablet   Other Relevant Orders   MR Lumbar Spine Wo Contrast   Back pain with radiation       Relevant Medications   HYDROcodone-acetaminophen (NORCO) 5-325 MG tablet   Other Relevant Orders   MR Lumbar Spine Wo Contrast   Chronic post-traumatic headache, not intractable       Relevant Medications   HYDROcodone-acetaminophen (NORCO) 5-325 MG tablet   Other Relevant Orders   CT Head Wo Contrast      Note: This dictation was prepared with Dragon dictation along with smaller phrase technology. Any transcriptional errors  that result from this process are unintentional.

## 2018-09-16 DIAGNOSIS — H401132 Primary open-angle glaucoma, bilateral, moderate stage: Secondary | ICD-10-CM | POA: Diagnosis not present

## 2018-09-19 ENCOUNTER — Ambulatory Visit (HOSPITAL_COMMUNITY)
Admission: RE | Admit: 2018-09-19 | Discharge: 2018-09-19 | Disposition: A | Discharge status: Home / Self Care | Payer: Medicare Other | Source: Ambulatory Visit | Attending: Family Medicine | Admitting: Family Medicine

## 2018-09-19 DIAGNOSIS — F0781 Postconcussional syndrome: Secondary | ICD-10-CM

## 2018-09-19 DIAGNOSIS — M5136 Other intervertebral disc degeneration, lumbar region: Secondary | ICD-10-CM | POA: Insufficient documentation

## 2018-09-19 DIAGNOSIS — M549 Dorsalgia, unspecified: Secondary | ICD-10-CM | POA: Diagnosis not present

## 2018-09-19 DIAGNOSIS — W19XXXD Unspecified fall, subsequent encounter: Secondary | ICD-10-CM | POA: Diagnosis not present

## 2018-09-19 DIAGNOSIS — M47896 Other spondylosis, lumbar region: Secondary | ICD-10-CM | POA: Diagnosis not present

## 2018-09-19 DIAGNOSIS — M48061 Spinal stenosis, lumbar region without neurogenic claudication: Secondary | ICD-10-CM | POA: Insufficient documentation

## 2018-09-19 DIAGNOSIS — R51 Headache: Secondary | ICD-10-CM | POA: Diagnosis not present

## 2018-09-19 DIAGNOSIS — G44329 Chronic post-traumatic headache, not intractable: Secondary | ICD-10-CM

## 2018-09-19 DIAGNOSIS — S3992XA Unspecified injury of lower back, initial encounter: Secondary | ICD-10-CM | POA: Diagnosis not present

## 2018-09-19 DIAGNOSIS — M51369 Other intervertebral disc degeneration, lumbar region without mention of lumbar back pain or lower extremity pain: Secondary | ICD-10-CM

## 2018-09-19 DIAGNOSIS — S0990XA Unspecified injury of head, initial encounter: Secondary | ICD-10-CM | POA: Diagnosis not present

## 2018-09-19 DIAGNOSIS — M545 Low back pain: Secondary | ICD-10-CM | POA: Diagnosis not present

## 2018-09-20 DIAGNOSIS — R200 Anesthesia of skin: Secondary | ICD-10-CM | POA: Diagnosis not present

## 2018-09-20 DIAGNOSIS — M1812 Unilateral primary osteoarthritis of first carpometacarpal joint, left hand: Secondary | ICD-10-CM | POA: Insufficient documentation

## 2018-09-20 DIAGNOSIS — S64497A Injury of digital nerve of left little finger, initial encounter: Secondary | ICD-10-CM | POA: Insufficient documentation

## 2018-09-20 DIAGNOSIS — M79642 Pain in left hand: Secondary | ICD-10-CM | POA: Insufficient documentation

## 2018-10-12 ENCOUNTER — Encounter: Payer: Self-pay | Admitting: Gastroenterology

## 2018-11-04 ENCOUNTER — Ambulatory Visit: Payer: Medicare Other | Admitting: Family Medicine

## 2018-11-11 ENCOUNTER — Other Ambulatory Visit: Payer: Self-pay

## 2018-11-11 ENCOUNTER — Ambulatory Visit (INDEPENDENT_AMBULATORY_CARE_PROVIDER_SITE_OTHER): Payer: Medicare Other | Admitting: Family Medicine

## 2018-11-11 ENCOUNTER — Encounter: Payer: Self-pay | Admitting: Family Medicine

## 2018-11-11 ENCOUNTER — Other Ambulatory Visit: Payer: Self-pay | Admitting: *Deleted

## 2018-11-11 VITALS — BP 120/62 | HR 82 | Temp 99.3°F | Resp 14 | Ht 66.0 in | Wt 215.0 lb

## 2018-11-11 DIAGNOSIS — J019 Acute sinusitis, unspecified: Secondary | ICD-10-CM | POA: Diagnosis not present

## 2018-11-11 MED ORDER — ALBUTEROL SULFATE HFA 108 (90 BASE) MCG/ACT IN AERS: 2.0000 | INHALATION_SPRAY | RESPIRATORY_TRACT | 3 refills | Status: DC | PRN

## 2018-11-11 MED ORDER — NITROGLYCERIN 0.4 MG/SPRAY TL SOLN: 1.0000 | 3 refills | Status: DC | PRN

## 2018-11-11 MED ORDER — NEOMYCIN-POLYMYXIN-DEXAMETH 3.5-10000-0.1 OP OINT: 1.0000 "application " | TOPICAL_OINTMENT | Freq: Every evening | OPHTHALMIC | 3 refills | Status: DC | PRN

## 2018-11-11 MED ORDER — HYDROCOD POLST-CPM POLST ER 10-8 MG/5ML PO SUER: 5.0000 mL | Freq: Two times a day (BID) | ORAL | 0 refills | Status: DC | PRN

## 2018-11-11 MED ORDER — AMOXICILLIN 875 MG PO TABS: 875.0000 mg | ORAL_TABLET | Freq: Two times a day (BID) | ORAL | 0 refills | Status: DC

## 2018-11-11 MED ORDER — LIFITEGRAST 5 % OP SOLN: 1.0000 [drp] | Freq: Two times a day (BID) | OPHTHALMIC | 3 refills | Status: DC

## 2018-11-11 MED ORDER — CYCLOBENZAPRINE HCL 10 MG PO TABS: ORAL_TABLET | ORAL | 2 refills | Status: DC

## 2018-11-11 MED ORDER — FENOFIBRATE 145 MG PO TABS: 145.0000 mg | ORAL_TABLET | Freq: Every day | ORAL | 3 refills | Status: DC

## 2018-11-11 MED ORDER — VITAMIN D2 50 MCG (2000 UT) PO TABS: 2000.0000 [IU] | ORAL_TABLET | Freq: Every day | ORAL | 3 refills | Status: DC

## 2018-11-11 MED ORDER — ALBUTEROL SULFATE (2.5 MG/3ML) 0.083% IN NEBU: 2.5000 mg | INHALATION_SOLUTION | RESPIRATORY_TRACT | 1 refills | Status: DC | PRN

## 2018-11-11 MED ORDER — DEXLANSOPRAZOLE 60 MG PO CPDR: 60.0000 mg | DELAYED_RELEASE_CAPSULE | Freq: Every day | ORAL | 3 refills | Status: DC

## 2018-11-11 MED ORDER — IRBESARTAN 150 MG PO TABS: 75.0000 mg | ORAL_TABLET | Freq: Every day | ORAL | 3 refills | Status: DC

## 2018-11-11 MED ORDER — ZOLPIDEM TARTRATE 10 MG PO TABS: ORAL_TABLET | ORAL | 2 refills | Status: DC

## 2018-11-11 MED ORDER — ONDANSETRON HCL 4 MG PO TABS: 4.0000 mg | ORAL_TABLET | Freq: Three times a day (TID) | ORAL | 1 refills | Status: DC | PRN

## 2018-11-11 MED ORDER — ESTRADIOL 0.1 MG/GM VA CREA: 1.0000 | TOPICAL_CREAM | VAGINAL | 3 refills | Status: DC | PRN

## 2018-11-11 MED ORDER — METRONIDAZOLE 0.75 % EX CREA: 1.0000 "application " | TOPICAL_CREAM | CUTANEOUS | 3 refills | Status: DC | PRN

## 2018-11-11 MED ORDER — HYDROCHLOROTHIAZIDE 12.5 MG PO TABS: 12.5000 mg | ORAL_TABLET | Freq: Every day | ORAL | 3 refills | Status: DC

## 2018-11-11 MED ORDER — BUDESONIDE-FORMOTEROL FUMARATE 160-4.5 MCG/ACT IN AERO: 2.0000 | INHALATION_SPRAY | Freq: Two times a day (BID) | RESPIRATORY_TRACT | 5 refills | Status: DC

## 2018-11-11 MED ORDER — MECLIZINE HCL 12.5 MG PO TABS: 12.5000 mg | ORAL_TABLET | Freq: Three times a day (TID) | ORAL | 0 refills | Status: DC | PRN

## 2018-11-11 MED ORDER — LATANOPROST 0.005 % OP SOLN: 1.0000 [drp] | Freq: Every day | OPHTHALMIC | 3 refills | Status: DC

## 2018-11-11 MED ORDER — MAGNESIUM OXIDE 400 MG PO TABS: 800.0000 mg | ORAL_TABLET | Freq: Every day | ORAL | Status: DC

## 2018-11-11 MED ORDER — ALPRAZOLAM 1 MG PO TABS: 1.0000 mg | ORAL_TABLET | Freq: Two times a day (BID) | ORAL | 0 refills | Status: DC | PRN

## 2018-11-11 NOTE — Patient Instructions (Signed)
F/U end for Physical/wellness

## 2018-11-11 NOTE — Progress Notes (Signed)
   Subjective:    Patient ID: [name], female    DOB: [birth year], 68 y.o.   MRN: [id number]  Patient presents for Illness (sinus pressure, facial swelling to R side of face under eye, HA, R ear pressure)   Had flu like symptoms 1st of January, she self treated symptoms at home with alka seltzer and her nausea pills. She had swelling over right sinus and ethomoid region a few weeks, that has not gone down, still has pain in sinus region and headache. Right ear pressure Took zyrtec today  Has a squeaky sound to her sinus region  Still has mild cough with production request tussionex  Not using any nasal sprays    She opted not to do hand surgery  Chronic pain in back, feels tight around- has DDD and facet arthritis,mild spinal stenosis, she is holding on injections and PT at this time   Review Of Systems:  GEN- denies fatigue, fever, weight loss,weakness, recent illness HEENT- denies eye drainage, change in vision, +nasal discharge, CVS- denies chest pain, palpitations RESP- denies SOB, +cough, wheeze ABD- denies N/V, change in stools, abd pain GU- denies dysuria, hematuria, dribbling, incontinence MSK-= joint pain, muscle aches, injury Neuro- + headache, dizziness, syncope, seizure activity       Objective:    BP 120/62   Pulse 82   Temp 99.3 F (37.4 C) (Oral)   Resp 14   Ht 5' 6"  (1.676 m)   Wt 215 lb (97.5 kg)   SpO2 96%   BMI 34.70 kg/m  GEN- NAD, alert and oriented x3 HEENT- PERRL, EOMI, non injected sclera, pink conjunctiva, MMM, oropharynx mild clear  TM clear bilat no effusion,  + maxillary sinus tenderness and right ethmoid tendenress, with mild swelling , inflammed turbinates,  Nasal drainage  Neck- Supple, no LAD CVS- RRR, no murmur RESP-CTAB EXT- No edema Pulses- Radial 2+         Assessment & Plan:     She is holding on intervention for chronic back pain at this time Problem List Items Addressed This Visit    None    Visit Diagnoses    Acute rhinosinusitis    -  Primary   Treat with oral allergy meds, amoxicillin x 10 days, refilled tussionex, not to take with her norco   Relevant Medications   chlorpheniramine-HYDROcodone (TUSSIONEX PENNKINETIC ER) 10-8 MG/5ML SUER   amoxicillin (AMOXIL) 875 MG tablet      Note: This dictation was prepared with Dragon dictation along with smaller phrase technology. Any transcriptional errors that result from this process are unintentional.

## 2018-12-05 ENCOUNTER — Telehealth: Payer: Self-pay | Admitting: Family Medicine

## 2018-12-05 MED ORDER — OSELTAMIVIR PHOSPHATE 75 MG PO CAPS: 75.0000 mg | ORAL_CAPSULE | Freq: Two times a day (BID) | ORAL | 0 refills | Status: DC

## 2018-12-05 NOTE — Telephone Encounter (Signed)
See how long pt has had symptoms if > 48 hours Tamiflu then it will not help very much and she needs to treat the symptoms of fever with tylenol Can take her nausea medication  If less than 48hours can call in Tamiflu 67m BID x 5 days

## 2018-12-05 NOTE — Telephone Encounter (Signed)
Pt states she has the flu. Fever of 100.1, chills, body aches, nausea. Wants something called in.    [employer].

## 2018-12-05 NOTE — Telephone Encounter (Signed)
Call placed to patient to inquire.   States that she noticed fever last night- T Max 101. Reports cough, malaise, body aches as well.   Prescription sent to pharmacy for Tamiflu.   Recommended that if symptoms worsen or persist >1 week to contact office for visit.

## 2018-12-05 NOTE — Telephone Encounter (Signed)
MD please advise

## 2018-12-19 ENCOUNTER — Telehealth: Payer: Self-pay | Admitting: *Deleted

## 2018-12-19 NOTE — Telephone Encounter (Signed)
Received call from patient.   Reports that she has completed tamiflu, but continues to cough.   States that she has not noticed any SOB or chest tightness, but nonprodictive cough continues.   Advised that cough can linger x2 weeks past Flu. Advised to use OTC cough medications as needed.   If you have any other concerns, please don't hesitate to contact our office.   Again, thanks!

## 2018-12-20 ENCOUNTER — Ambulatory Visit: Payer: Medicare Other | Admitting: Family Medicine

## 2019-01-23 ENCOUNTER — Encounter: Payer: Self-pay | Admitting: Gastroenterology

## 2019-01-23 ENCOUNTER — Ambulatory Visit (INDEPENDENT_AMBULATORY_CARE_PROVIDER_SITE_OTHER): Payer: Medicare Other | Admitting: Gastroenterology

## 2019-01-23 ENCOUNTER — Other Ambulatory Visit: Payer: Self-pay

## 2019-01-23 DIAGNOSIS — K625 Hemorrhage of anus and rectum: Secondary | ICD-10-CM | POA: Diagnosis not present

## 2019-01-23 DIAGNOSIS — K582 Mixed irritable bowel syndrome: Secondary | ICD-10-CM | POA: Diagnosis not present

## 2019-01-23 DIAGNOSIS — K7581 Nonalcoholic steatohepatitis (NASH): Secondary | ICD-10-CM | POA: Diagnosis not present

## 2019-01-23 MED ORDER — DICYCLOMINE HCL 10 MG PO CAPS: ORAL_CAPSULE | ORAL | 1 refills | Status: DC
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
## 2019-01-23 NOTE — Patient Instructions (Addendum)
1. Start Benefiber 1-2 teaspoons once daily and gradually increase to twice daily over the next 7-10 days.  2. Continue Dexilant once daily before breakfast.  3. Continue probiotics daily.  4. Start bentyl (dicyclomine) one capsule up to four times daily as needed for abdominal pain and frequent stools. Typically works better before meals to prevent symptoms. HOLD for a day or two if you note constipation.  5. Call and let me know if your symptoms don't start settling down or if you develop worsening abdominal pain, fever.  6. Return to the office in four months to see Dr. Oneida Alar. We will schedule your appointment.

## 2019-01-24 ENCOUNTER — Encounter: Payer: Self-pay | Admitting: Gastroenterology

## 2019-01-24 NOTE — Progress Notes (Signed)
cc'ed to pcp °

## 2019-01-25 NOTE — Progress Notes (Signed)
cc'd to pcp 

## 2019-01-25 NOTE — Progress Notes (Signed)
cc'ed to pcp °

## 2019-01-27 ENCOUNTER — Ambulatory Visit: Payer: Self-pay | Admitting: Family Medicine

## 2019-03-10 ENCOUNTER — Telehealth: Payer: Self-pay

## 2019-03-10 NOTE — Telephone Encounter (Signed)
If she has cough with SOB , needs to go to ER, they can swab there AND Do CXR to r/o PNA

## 2019-03-10 NOTE — Telephone Encounter (Signed)
Pt called to report that she went to the grocery store on 05/29 with her mask and gloves. Pt states she is currently experiencing a cough, SOB, and low grade fever. States she can't take a deep breath. Using neb and inhaler. Pt states she also experiencing pain in her ribs. Pt is worried about her lungs and rib area. Please advise.

## 2019-04-25 ENCOUNTER — Encounter: Payer: Self-pay | Admitting: Gastroenterology

## 2019-05-05 ENCOUNTER — Other Ambulatory Visit: Payer: Self-pay

## 2019-05-30 ENCOUNTER — Other Ambulatory Visit (HOSPITAL_COMMUNITY): Payer: Self-pay | Admitting: Family Medicine

## 2019-05-30 DIAGNOSIS — Z1231 Encounter for screening mammogram for malignant neoplasm of breast: Secondary | ICD-10-CM

## 2019-06-02 ENCOUNTER — Other Ambulatory Visit: Payer: Self-pay

## 2019-06-05 ENCOUNTER — Other Ambulatory Visit: Payer: Medicare Other

## 2019-06-05 ENCOUNTER — Other Ambulatory Visit: Payer: Self-pay

## 2019-06-05 ENCOUNTER — Ambulatory Visit (INDEPENDENT_AMBULATORY_CARE_PROVIDER_SITE_OTHER): Payer: Medicare Other

## 2019-06-05 ENCOUNTER — Ambulatory Visit: Payer: Medicare Other | Admitting: Family Medicine

## 2019-06-05 DIAGNOSIS — Z23 Encounter for immunization: Secondary | ICD-10-CM

## 2019-06-05 DIAGNOSIS — E785 Hyperlipidemia, unspecified: Secondary | ICD-10-CM | POA: Diagnosis not present

## 2019-06-05 DIAGNOSIS — I10 Essential (primary) hypertension: Secondary | ICD-10-CM | POA: Diagnosis not present

## 2019-06-05 DIAGNOSIS — N182 Chronic kidney disease, stage 2 (mild): Secondary | ICD-10-CM

## 2019-06-05 NOTE — Progress Notes (Signed)
Patient came in today to receive her annual flu shot. Patient was given high dose fluad vaccine. Vaccine was given in the right deltoid. Patient tolerated well. VIS was given.

## 2019-06-06 LAB — COMPREHENSIVE METABOLIC PANEL
AG Ratio: 1.8 (calc) (ref 1.0–2.5)
ALT: 43 U/L — ABNORMAL HIGH (ref 6–29)
AST: 74 U/L — ABNORMAL HIGH (ref 10–35)
Albumin: 4.3 g/dL (ref 3.6–5.1)
Alkaline phosphatase (APISO): 43 U/L (ref 37–153)
BUN/Creatinine Ratio: 13 (calc) (ref 6–22)
BUN: 18 mg/dL (ref 7–25)
CO2: 25 mmol/L (ref 20–32)
Calcium: 10 mg/dL (ref 8.6–10.4)
Chloride: 98 mmol/L (ref 98–110)
Creat: 1.39 mg/dL — ABNORMAL HIGH (ref 0.50–0.99)
Globulin: 2.4 g/dL (calc) (ref 1.9–3.7)
Glucose, Bld: 124 mg/dL — ABNORMAL HIGH (ref 65–99)
Potassium: 4.2 mmol/L (ref 3.5–5.3)
Sodium: 134 mmol/L — ABNORMAL LOW (ref 135–146)
Total Bilirubin: 0.9 mg/dL (ref 0.2–1.2)
Total Protein: 6.7 g/dL (ref 6.1–8.1)

## 2019-06-06 LAB — CBC WITH DIFFERENTIAL/PLATELET
Absolute Monocytes: 550 cells/uL (ref 200–950)
Basophils Absolute: 58 cells/uL (ref 0–200)
Basophils Relative: 0.9 %
Eosinophils Absolute: 390 cells/uL (ref 15–500)
Eosinophils Relative: 6.1 %
HCT: 36.4 % (ref 35.0–45.0)
Hemoglobin: 12.3 g/dL (ref 11.7–15.5)
Lymphs Abs: 2042 cells/uL (ref 850–3900)
MCH: 32.3 pg (ref 27.0–33.0)
MCHC: 33.8 g/dL (ref 32.0–36.0)
MCV: 95.5 fL (ref 80.0–100.0)
MPV: 10.5 fL (ref 7.5–12.5)
Monocytes Relative: 8.6 %
Neutro Abs: 3360 cells/uL (ref 1500–7800)
Neutrophils Relative %: 52.5 %
Platelets: 209 10*3/uL (ref 140–400)
RBC: 3.81 10*6/uL (ref 3.80–5.10)
RDW: 11.8 % (ref 11.0–15.0)
Total Lymphocyte: 31.9 %
WBC: 6.4 10*3/uL (ref 3.8–10.8)

## 2019-06-06 LAB — LIPID PANEL
Cholesterol: 187 mg/dL (ref <200)
HDL: 24 mg/dL — ABNORMAL LOW (ref >=50)
LDL Cholesterol (Calc): 120 mg/dL (calc) — ABNORMAL HIGH
Non-HDL Cholesterol (Calc): 163 mg/dL (calc) — ABNORMAL HIGH (ref <130)
Total CHOL/HDL Ratio: 7.8 (calc) — ABNORMAL HIGH (ref <5.0)
Triglycerides: 280 mg/dL — ABNORMAL HIGH (ref <150)

## 2019-06-14 ENCOUNTER — Encounter: Payer: Self-pay | Admitting: Family Medicine

## 2019-06-14 ENCOUNTER — Other Ambulatory Visit: Payer: Self-pay

## 2019-06-14 ENCOUNTER — Ambulatory Visit (INDEPENDENT_AMBULATORY_CARE_PROVIDER_SITE_OTHER): Payer: Medicare Other | Admitting: Family Medicine

## 2019-06-14 VITALS — BP 128/78 | HR 90 | Temp 98.7°F | Resp 14 | Ht 66.0 in | Wt 219.0 lb

## 2019-06-14 DIAGNOSIS — E785 Hyperlipidemia, unspecified: Secondary | ICD-10-CM | POA: Diagnosis not present

## 2019-06-14 DIAGNOSIS — I10 Essential (primary) hypertension: Secondary | ICD-10-CM

## 2019-06-14 DIAGNOSIS — K7581 Nonalcoholic steatohepatitis (NASH): Secondary | ICD-10-CM | POA: Diagnosis not present

## 2019-06-14 DIAGNOSIS — Z86711 Personal history of pulmonary embolism: Secondary | ICD-10-CM

## 2019-06-14 DIAGNOSIS — R7301 Impaired fasting glucose: Secondary | ICD-10-CM

## 2019-06-14 DIAGNOSIS — R0602 Shortness of breath: Secondary | ICD-10-CM | POA: Diagnosis not present

## 2019-06-14 DIAGNOSIS — N182 Chronic kidney disease, stage 2 (mild): Secondary | ICD-10-CM | POA: Diagnosis not present

## 2019-06-14 MED ORDER — BRIMONIDINE TARTRATE 0.2 % OP SOLN: 1.0000 [drp] | Freq: Two times a day (BID) | OPHTHALMIC | 3 refills | Status: DC

## 2019-06-14 MED ORDER — DEXILANT 60 MG PO CPDR: 60.0000 mg | DELAYED_RELEASE_CAPSULE | Freq: Every day | ORAL | 3 refills | Status: DC

## 2019-06-14 MED ORDER — ALBUTEROL SULFATE (2.5 MG/3ML) 0.083% IN NEBU: 2.5000 mg | INHALATION_SOLUTION | RESPIRATORY_TRACT | 3 refills | Status: DC | PRN

## 2019-06-14 MED ORDER — HYDROCHLOROTHIAZIDE 12.5 MG PO TABS: 12.5000 mg | ORAL_TABLET | Freq: Every day | ORAL | 3 refills | Status: DC

## 2019-06-14 MED ORDER — ALPRAZOLAM 1 MG PO TABS: 1.0000 mg | ORAL_TABLET | Freq: Two times a day (BID) | ORAL | 3 refills | Status: DC | PRN

## 2019-06-14 MED ORDER — NEOMYCIN-POLYMYXIN-DEXAMETH 3.5-10000-0.1 OP OINT: 1.0000 "application " | TOPICAL_OINTMENT | Freq: Every evening | OPHTHALMIC | 3 refills | Status: DC | PRN

## 2019-06-14 MED ORDER — FENOFIBRATE 145 MG PO TABS: 145.0000 mg | ORAL_TABLET | Freq: Every day | ORAL | 3 refills | Status: DC

## 2019-06-14 MED ORDER — METRONIDAZOLE 0.75 % EX CREA: 1.0000 "application " | TOPICAL_CREAM | CUTANEOUS | 3 refills | Status: DC | PRN

## 2019-06-14 MED ORDER — MECLIZINE HCL 12.5 MG PO TABS: 12.5000 mg | ORAL_TABLET | Freq: Three times a day (TID) | ORAL | 3 refills | Status: DC | PRN

## 2019-06-14 MED ORDER — CYCLOBENZAPRINE HCL 10 MG PO TABS: ORAL_TABLET | ORAL | 3 refills | Status: DC

## 2019-06-14 MED ORDER — ESTRADIOL 0.1 MG/GM VA CREA: 1.0000 | TOPICAL_CREAM | VAGINAL | 3 refills | Status: DC | PRN

## 2019-06-14 MED ORDER — ALBUTEROL SULFATE HFA 108 (90 BASE) MCG/ACT IN AERS: 2.0000 | INHALATION_SPRAY | RESPIRATORY_TRACT | 3 refills | Status: DC | PRN

## 2019-06-14 MED ORDER — LATANOPROST 0.005 % OP SOLN: 1.0000 [drp] | Freq: Every day | OPHTHALMIC | 3 refills | Status: DC

## 2019-06-14 MED ORDER — ZOLPIDEM TARTRATE 10 MG PO TABS: ORAL_TABLET | ORAL | 3 refills | Status: DC

## 2019-06-14 MED ORDER — XIIDRA 5 % OP SOLN: 1.0000 [drp] | OPHTHALMIC | 3 refills | Status: DC | PRN

## 2019-06-14 MED ORDER — IRBESARTAN 150 MG PO TABS: 75.0000 mg | ORAL_TABLET | Freq: Every day | ORAL | 3 refills | Status: DC

## 2019-06-14 NOTE — Patient Instructions (Addendum)
Get chest xray on Monday Continue your current meds Work on dietary changes, get frozen vegetables  We will call with lab results  F/u 4 months for Physical

## 2019-06-14 NOTE — Progress Notes (Addendum)
Subjective:    Patient ID: [name], female    DOB: 1950/11/04, 68 y.o.   MRN: [id number]  Patient presents for Follow-up (has had labs) and Back Pain (L sided pain- S/P fall)   Pt here to f/u chronic medical problems  medications reviewed.   Recent fasting labs reviewed    Fatty liver, chronically elevated    She lost her balance when her right knee buckled on her and she felt into the sofa, did not injury herself.  she has chronic back pain, sates it hurts on and off       Scheduled for mammogram next Monday, sates she hurts all over, always on left side of her chest, has discomfort when she takes a deep breath, feels like her lymph nodes are swollen at times around her neck  Feels like her breast is swollen randomly on left side     She admits back in June she had SOB, cough, she was using neb all the time, but refused going to ER for covid testing and CXR   No further cough, but still per above still feels discomfort on left side of chet        Review Of Systems:  GEN- denies fatigue, fever, weight loss,weakness, recent illness HEENT- denies eye drainage, change in vision, nasal discharge, CVS- denies chest pain, palpitations RESP-+ SOB, cough, wheeze ABD- denies N/V, change in stools, abd pain GU- denies dysuria, hematuria, dribbling, incontinence MSK- +joint pain, muscle aches, injury Neuro- denies headache, dizziness, syncope, seizure activity       Objective:    BP 128/78   Pulse 90   Temp 98.7 F (37.1 C) (Oral)   Resp 14   Ht 5' 6"  (1.676 m)   Wt 219 lb (99.3 kg)   SpO2 97%   BMI 35.35 kg/m  GEN- NAD, alert and oriented x3 HEENT- PERRL, EOMI, non injected sclera, pink conjunctiva, MMM, oropharynx clear Neck- Supple, no thyromegaly, no lymphadenopathy Axilla no lymph nodes noted she does have some tenderness when I palpate her tricep region on the left arm but no mass or nodule palpated CVS- RRR, no  murmur RESP-CTAB ABD-NABS,soft,NT,ND Musculoskeletal-spine nontender fair range of motion negative straight leg raise EXT- No edema Pulses- Radial  2+        Assessment & Plan:      Problem List Items Addressed This Visit      Unprioritized   CKD (chronic kidney disease), stage II   Essential hypertension, benign    Pressure is controlled.  She has history of Karlene Lineman she has not been eating correct foods her triglycerides have gone up therefore her liver function have also gone back up she is dealt with this for many years.  We discussed the dietary changes which she is already aware of.  She has been seen by GI multiple times for this.  She will continue her current medications.  Elevated fasting blood sugar will check an A1c ensure that she is not diabetic.  Very odd fleeting symptoms with back discomfort or shortness of breath breast swelling on and off.  Nothing specific found on examination.  She does have mammogram set for Monday to evaluate the breast.  She does have history of PE so I will obtain a d-dimer I will also obtain a chest x-ray.  If her d-dimer does come back positive we will get a CTA. The past for her chronic back pain she did have MRI we did discuss going orthopedics for  possible injection she declined at that time this is still an option for her.      Relevant Medications   hydrochlorothiazide (HYDRODIURIL) 12.5 MG tablet   fenofibrate (TRICOR) 145 MG tablet   irbesartan (AVAPRO) 150 MG tablet   Other Relevant Orders   Basic metabolic panel   Hyperlipidemia   Relevant Medications   hydrochlorothiazide (HYDRODIURIL) 12.5 MG tablet   fenofibrate (TRICOR) 145 MG tablet   irbesartan (AVAPRO) 150 MG tablet   Nonalcoholic steatohepatitis (NASH)    Other Visit Diagnoses    History of pulmonary embolism    -  Primary   Relevant Orders   D-dimer, quantitative (not at [facility])   SOB (shortness of breath)       Relevant Orders   D-dimer, quantitative (not at  [facility])   DG Chest 2 View   Elevated fasting glucose       Relevant Orders   Hemoglobin A1c      Note: This dictation was prepared with Dragon dictation along with smaller phrase technology. Any transcriptional errors that result from this process are unintentional.

## 2019-06-14 NOTE — Assessment & Plan Note (Signed)
Pressure is controlled.  She has history of [name] she has not been eating correct foods her triglycerides have gone up therefore her liver function have also gone back up she is dealt with this for many years.  We discussed the dietary changes which she is already aware of.  She has been seen by GI multiple times for this.  She will continue her current medications.  Elevated fasting blood sugar will check an A1c ensure that she is not diabetic.  Very odd fleeting symptoms with back discomfort or shortness of breath breast swelling on and off.  Nothing specific found on examination.  She does have mammogram set for Monday to evaluate the breast.  She does have history of PE so I will obtain a d-dimer I will also obtain a chest x-ray.  If her d-dimer does come back positive we will get a CTA. The past for her chronic back pain she did have MRI we did discuss going orthopedics for possible injection she declined at that time this is still an option for her.

## 2019-06-15 LAB — HEMOGLOBIN A1C
Hgb A1c MFr Bld: 5.4 % of total Hgb (ref <5.7)
Mean Plasma Glucose: 108 (calc)
eAG (mmol/L): 6 (calc)

## 2019-06-15 LAB — BASIC METABOLIC PANEL
BUN/Creatinine Ratio: 14 (calc) (ref 6–22)
BUN: 17 mg/dL (ref 7–25)
CO2: 21 mmol/L (ref 20–32)
Calcium: 9.6 mg/dL (ref 8.6–10.4)
Chloride: 105 mmol/L (ref 98–110)
Creat: 1.22 mg/dL — ABNORMAL HIGH (ref 0.50–0.99)
Glucose, Bld: 118 mg/dL — ABNORMAL HIGH (ref 65–99)
Potassium: 4.1 mmol/L (ref 3.5–5.3)
Sodium: 140 mmol/L (ref 135–146)

## 2019-06-15 LAB — D-DIMER, QUANTITATIVE: D-Dimer, Quant: 0.32 mcg/mL FEU (ref <0.50)

## 2019-06-19 ENCOUNTER — Other Ambulatory Visit (HOSPITAL_COMMUNITY): Payer: Self-pay | Admitting: Family Medicine

## 2019-06-19 ENCOUNTER — Ambulatory Visit (HOSPITAL_COMMUNITY)
Admission: RE | Admit: 2019-06-19 | Discharge: 2019-06-19 | Disposition: A | Discharge status: Home / Self Care | Payer: Medicare Other | Source: Ambulatory Visit | Attending: Family Medicine | Admitting: Family Medicine

## 2019-06-19 ENCOUNTER — Other Ambulatory Visit: Payer: Self-pay

## 2019-06-19 ENCOUNTER — Encounter (HOSPITAL_COMMUNITY): Payer: Self-pay

## 2019-06-19 DIAGNOSIS — R05 Cough: Secondary | ICD-10-CM | POA: Diagnosis not present

## 2019-06-19 DIAGNOSIS — N644 Mastodynia: Secondary | ICD-10-CM

## 2019-06-19 DIAGNOSIS — R0602 Shortness of breath: Secondary | ICD-10-CM | POA: Diagnosis not present

## 2019-06-19 DIAGNOSIS — Z1231 Encounter for screening mammogram for malignant neoplasm of breast: Secondary | ICD-10-CM | POA: Diagnosis not present

## 2019-06-27 ENCOUNTER — Other Ambulatory Visit: Payer: Self-pay

## 2019-06-27 ENCOUNTER — Ambulatory Visit (HOSPITAL_COMMUNITY)
Admission: RE | Admit: 2019-06-27 | Discharge: 2019-06-27 | Disposition: A | Discharge status: Home / Self Care | Payer: Medicare Other | Source: Ambulatory Visit | Attending: Family Medicine | Admitting: Family Medicine

## 2019-06-27 ENCOUNTER — Ambulatory Visit (HOSPITAL_COMMUNITY): Payer: Medicare Other

## 2019-06-27 DIAGNOSIS — N6001 Solitary cyst of right breast: Secondary | ICD-10-CM | POA: Diagnosis not present

## 2019-06-27 DIAGNOSIS — N644 Mastodynia: Secondary | ICD-10-CM | POA: Diagnosis not present

## 2019-06-27 DIAGNOSIS — R928 Other abnormal and inconclusive findings on diagnostic imaging of breast: Secondary | ICD-10-CM | POA: Diagnosis not present

## 2019-06-27 DIAGNOSIS — N6489 Other specified disorders of breast: Secondary | ICD-10-CM | POA: Diagnosis not present

## 2019-07-06 ENCOUNTER — Ambulatory Visit: Payer: Medicare Other | Admitting: Cardiology

## 2019-07-19 ENCOUNTER — Other Ambulatory Visit: Payer: Self-pay | Admitting: *Deleted

## 2019-07-19 MED ORDER — ESTRADIOL 0.1 MG/GM VA CREA: 1.0000 | TOPICAL_CREAM | Freq: Every day | VAGINAL | 3 refills | Status: DC | PRN

## 2019-07-19 MED ORDER — XIIDRA 5 % OP SOLN: 1.0000 [drp] | Freq: Every day | OPHTHALMIC | 3 refills | Status: DC | PRN

## 2019-07-25 ENCOUNTER — Telehealth: Payer: Self-pay | Admitting: Cardiology

## 2019-07-25 NOTE — Telephone Encounter (Signed)

## 2019-07-27 ENCOUNTER — Encounter: Payer: Self-pay | Admitting: Cardiology

## 2019-07-27 ENCOUNTER — Ambulatory Visit: Payer: Medicare Other | Admitting: Cardiology
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
## 2019-07-28 ENCOUNTER — Telehealth (INDEPENDENT_AMBULATORY_CARE_PROVIDER_SITE_OTHER): Payer: Medicare Other | Admitting: Cardiology

## 2019-07-28 ENCOUNTER — Encounter: Payer: Self-pay | Admitting: Cardiology

## 2019-07-28 ENCOUNTER — Other Ambulatory Visit: Payer: Self-pay

## 2019-07-28 VITALS — BP 124/74 | HR 82 | Ht 67.0 in | Wt 214.0 lb

## 2019-07-28 DIAGNOSIS — E782 Mixed hyperlipidemia: Secondary | ICD-10-CM | POA: Diagnosis not present

## 2019-07-28 DIAGNOSIS — R0789 Other chest pain: Secondary | ICD-10-CM

## 2019-07-28 DIAGNOSIS — Z789 Other specified health status: Secondary | ICD-10-CM

## 2019-07-28 DIAGNOSIS — I10 Essential (primary) hypertension: Secondary | ICD-10-CM | POA: Diagnosis not present

## 2019-07-28 DIAGNOSIS — R072 Precordial pain: Secondary | ICD-10-CM

## 2019-07-28 DIAGNOSIS — Z86711 Personal history of pulmonary embolism: Secondary | ICD-10-CM

## 2019-07-28 NOTE — Patient Instructions (Signed)
Medication Instructions:  Your physician recommends that you continue on your current medications as directed. Please refer to the Current Medication list given to you today.  *If you need a refill on your cardiac medications before your next appointment, please call your pharmacy*  Lab Work: None today If you have labs (blood work) drawn today and your tests are completely normal, you will receive your results only by: Marland Kitchen MyChart Message (if you have MyChart) OR . A paper copy in the mail If you have any lab test that is abnormal or we need to change your treatment, we will call you to review the results.  Testing/Procedures: Your physician has requested that you have an echocardiogram. Echocardiography is a painless test that uses sound waves to create images of your heart. It provides your doctor with information about the size and shape of your heart and how well your heart's chambers and valves are working. This procedure takes approximately one hour. There are no restrictions for this procedure.    Follow-Up: At [facility], you and your health needs are our priority.  As part of our continuing mission to provide you with exceptional heart care, we have created designated Provider Care Teams.  These Care Teams include your primary Cardiologist (physician) and Advanced Practice Providers (APPs -  Physician Assistants and Nurse Practitioners) who all work together to provide you with the care you need, when you need it.  Your next appointment:   We will call you with results   Other Instructions  On the day of your echo, come by office to get EKG     Thank you for choosing [facility] !

## 2019-08-09 ENCOUNTER — Other Ambulatory Visit (HOSPITAL_COMMUNITY): Payer: Medicare Other

## 2019-08-09 ENCOUNTER — Ambulatory Visit: Payer: Medicare Other

## 2019-08-17 ENCOUNTER — Other Ambulatory Visit: Payer: Self-pay

## 2019-08-17 ENCOUNTER — Ambulatory Visit (INDEPENDENT_AMBULATORY_CARE_PROVIDER_SITE_OTHER): Payer: Medicare Other | Admitting: Gastroenterology

## 2019-08-17 ENCOUNTER — Encounter: Payer: Self-pay | Admitting: Gastroenterology

## 2019-08-17 DIAGNOSIS — K6289 Other specified diseases of anus and rectum: Secondary | ICD-10-CM | POA: Diagnosis not present

## 2019-08-17 MED ORDER — NITROGLYCERIN 0.4 % RE OINT: 1.0000 [drp] | TOPICAL_OINTMENT | Freq: Three times a day (TID) | RECTAL | 1 refills | Status: DC

## 2019-08-17 MED ORDER — LIDOCAINE-HYDROCORTISONE ACE 3-2.5 % RE KIT: PACK | RECTAL | 11 refills | Status: DC

## 2019-08-17 NOTE — Progress Notes (Signed)
Subjective:    Patient ID: [name], female    DOB: 1950/11/17, 68 y.o.   MRN: [id number]  Alycia Rossetti, MD  HPI SYMPTOMS FLARED MOS AGO AFTER LIFTING TWO CASES OF WATER. Burning when she has a BM. LIDOCAINE calms it down and IT HELPS TEMPORARILY. Feels like it affecting her digestion and gut. Feels like she has abdominal pain like she's giving birth. WAKES UP NAUSEATED AND SHE CAN'T GET OUT OF BED OR SHE THROWS UP. SOFTENING STOOL HELPS. HARD STOOLS ARE IMPOSSIBLE TO PASS. SCARED TO SEE A DOCTOR DUE TO COVID. FRUSTRATION CAME TO AN HEAD AND CALLED FOR AN APPT. IF SHE LIFTS ANYTHING AND STRAINS OPENS UP WHAT FEELS LIKE A FISSURE ON THE LEFT SIDE AND THEN IT FEELS LIKE IT'S BLISTERING ALL AROUND. SITZ BATHS AND LIDOCAINE/NTG HELP AND TO RELIEVE SYMPTOMS. NOT SURE HOW MANY TIMES A DAY SHE CAN USE IT. DOESN'T DRIVE. EATS SEAFOOD AND CHICKEN SOUP. ORDERED UDON NOODLES AND KONJAC NOODLES. WHEN SHE GETS ON THE TREAD MILL SHE STARTS TO HAVE HORRIBLE PAIN IN LOWER ABDOMEN. FEELS LIKE SHE HAS A HERNIA. AFTER HAVING CLOT DURING HEMORRHOID SURGERY FELT THE SAME WAY. SEES RED BLOOD AROUND THE ANUS. NOW USING CHARMIN ULTRA-GENTLE.  HAVING MULTIPLE BMs AND NOW ON MAGNESIUM AND HAS #7 AND IF BACKS OFF IT'S LIKE A LOG. MAY USE SENNA OCCASIONALLY. DESITIN JUST INSIDE HELPS THE MOST.  PT DENIES FEVER, CHILLS, HEMATEMESIS, nausea, vomiting, melena, CHEST PAIN, SHORTNESS OF BREATH,  CHANGE IN BOWEL IN HABITS, constipation, abdominal pain, problems swallowing, OR heartburn or indigestion.  Past Medical History:  Diagnosis Date  . Anxiety   . Arthritis   . Asthma   . Chest pain    a. 02/2000 Cath: nl cors, EF 60%;  b. 10/2010 MV: nl LV, no ischemia/infarct;  c. 03/2014 Admit c/p, r/o->grief from husbands death.  . Chronic RUQ pain 2007/12/02   EUS slightly dilated CBD (7.77m), otherwise nl  . Depression   . Eczema   . Exposure to chemical inhalation mid 1970s  . Fatty liver   . G6PD deficiency   . GERD  (gastroesophageal reflux disease)   . Glaucoma   . History of pulmonary embolus (PE)    Treated with Eliquis 202/27/2016 . HTN (hypertension)   . Hypercholesteremia    a. intolerant to statins.  . IBS (irritable bowel syndrome)   . Kidney stone   . 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 Palpitations    a. 05/2008 Echo: nl LV fxn.  .Marland KitchenPONV (postoperative nausea and vomiting)   . Recurrent upper respiratory infection (URI)   . Renal cyst   . Sickle cell trait ([place]   . Tubular adenoma of colon 09/17/2011   12/2010 Next colonoscopy 12/2015   . Urticaria    Past Surgical History:  Procedure Laterality Date  . ABDOMINAL HYSTERECTOMY    . ADENOIDECTOMY    . ANGIOPLASTY    . APPENDECTOMY    . BIOPSY N/A 03/14/2013   Procedure: SMALL BOWEL AND GASTRIC BIOPSIES (Procedure #1);  Surgeon: SDanie Binder MD;  Location: [facility];  Service: Endoscopy;  Laterality: N/A;  . CARDIAC CATHETERIZATION Left 02/11/2000  . CATARACT EXTRACTION Left   . CHOLECYSTECTOMY  12/2007  . COLONOSCOPY  12/22/10   ROEH:OZYYQM cecal adenomatous polyp  . COLONOSCOPY WITH PROPOFOL N/A 03/31/2016   Dr. FOneida Alar four sessile polyps rectum/sigmoid colon, diverticulosi, ext/int hemorrhoids. hyperplastic polyps, next tcs 5 years.  .Marland Kitchen  complete hysterectomy    . ENTEROSCOPY N/A 03/14/2013   KDT:OIZT gastritis/ulcers has healed  . ESOPHAGOGASTRODUODENOSCOPY  09/22/2011   IWP:YKDX gastritis/Duodenitis  . FLEXIBLE SIGMOIDOSCOPY N/A 03/14/2013   SLF:3 colon polyp removed/moderate sized internal hemorrhoids  . FLEXIBLE SIGMOIDOSCOPY N/A 06/09/2016   Procedure: FLEXIBLE SIGMOIDOSCOPY;  Surgeon: Danie Binder, MD;  Location: [facility];  Service: Endoscopy;  Laterality: N/A;  rectal polyps times 2  . FLEXIBLE SIGMOIDOSCOPY N/A 03/18/2018   Procedure: FLEXIBLE SIGMOIDOSCOPY;  Surgeon: Danie Binder, MD;  Location: [facility];  Service: Endoscopy;  Laterality: N/A;  9:15am  . FOOT SURGERY     bunion removal left   . GIVENS CAPSULE STUDY N/A 02/10/2013   Procedure: GIVENS CAPSULE STUDY;  Surgeon: Danie Binder, MD;  Location: [facility];  Service: Endoscopy;  Laterality: N/A;  730  . HEEL SPUR EXCISION     right   . HEMORRHOID BANDING N/A 03/14/2013   Procedure: HEMORRHOID BANDING (Procedure #3)  3 bands applied IPJ#[id number] Exp 02/02/2014 ;  Surgeon: Danie Binder, MD;  Location: [facility];  Service: Endoscopy;  Laterality: N/A;  . HEMORRHOID SURGERY N/A 04/24/2016   Procedure: EXTENSIVE HEMORRHOIDECTOMY;  Surgeon: Vickie Epley, MD;  Location: [facility];  Service: General;  Laterality: N/A;  . LAPAROSCOPY     adhesions  . POLYPECTOMY N/A 03/14/2013   QBH:ALPF Gastritis . ULCERS SEEN ON MAY 6 HAVE HEALED  . POLYPECTOMY  03/31/2016   Procedure: POLYPECTOMY;  Surgeon: Danie Binder, MD;  Location: [facility];  Service: Endoscopy;;  sigmoid colon polyps x2, rectal polyps x2  . TONSILLECTOMY    . TUBAL LIGATION     Allergies  Allergen Reactions  . Adhesive [Tape] Other (See Comments)    Blisters skin  . Aspirin Other (See Comments)    sickle cell trait--  Not recommended unless emergency  . Keflex [Cephalexin] Hives  . Levaquin [Levofloxacin In D5w]     Altered mental status   . Other     Nut Oil- skin irritation   . Statins Other (See Comments)    Myopathy - elevated CK  . Sulfonamide Derivatives Other (See Comments)    Patient has sickle cell trait  . Latex Rash   Current Outpatient Medications  Medication Sig    . albuterol (PROVENTIL) (2.5 MG/3ML) 0.083% nebulizer solution Take 3 mLs (2.5 mg total) by nebulization every 4 (four) hours as needed for wheezing or shortness of breath.    Marland Kitchen albuterol (VENTOLIN HFA) 108 (90 Base) MCG/ACT inhaler Inhale 2 puffs into the lungs every 4 (four) hours as needed for wheezing.    Marland Kitchen ALPRAZolam (XANAX) 1 MG tablet Take 1 tablet (1 mg total) by mouth 2 (two) times daily as needed for anxiety.    . brimonidine (ALPHAGAN) 0.2 % ophthalmic solution Place  1 drop into both eyes 2 (two) times daily.    . budesonide-formoterol (SYMBICORT) 160-4.5 MCG/ACT inhaler Inhale 2 puffs into the lungs 2 (two) times daily. (Patient taking differently: Inhale 2 puffs into the lungs as needed. )    . chlorpheniramine-HYDROcodone (TUSSIONEX PENNKINETIC ER) 10-8 MG/5ML SUER Take 5 mLs by mouth every 12 (twelve) hours as needed.    . cyclobenzaprine (FLEXERIL) 10 MG tablet TAKE ONE TABLET BY MOUTH DAILY  as needed muscle spasms    . dexlansoprazole (DEXILANT) 60 MG capsule Take 1 capsule (60 mg total) by mouth daily.    Marland Kitchen dicyclomine (BENTYL) 10 MG capsule Take one capsule  before meals and at bedtime AS NEEDED for abdominal pain and frequent stool. HOLD for constipation    . Ergocalciferol (VITAMIN D2) 50 MCG (2000 UT) TABS Take 2,000 Units by mouth daily.    Marland Kitchen estradiol (ESTRACE VAGINAL) 0.1 MG/GM vaginal cream Place 1 Applicatorful vaginally daily as needed.    . fenofibrate (TRICOR) 145 MG tablet Take 1 tablet (145 mg total) by mouth daily.    . hydrochlorothiazide (HYDRODIURIL) 12.5 MG tablet Take 1 tablet (12.5 mg total) by mouth daily.    Marland Kitchen HYDROcodone-acetaminophen (NORCO) 5-325 MG tablet Take 1 tablet by mouth every 6 (six) hours as needed for moderate pain.    Marland Kitchen irbesartan (AVAPRO) 150 MG tablet Take 0.5 tablets (75 mg total) by mouth daily.    Marland Kitchen latanoprost (XALATAN) 0.005 % ophthalmic solution Place 1 drop into both eyes at bedtime.    . Lidocaine-Hydrocortisone Ace 3-2.5 % KIT APPLY TO RECTUM QID for 2 weeks then AS NEEDED FOR RECTAL PAIN OR BLEEDING    . Lifitegrast (XIIDRA) 5 % SOLN Place 1 drop into both eyes daily as needed.    . magnesium oxide (HM MAGNESIUM) 400 MG tablet Take 2 tablets (800 mg total) by mouth daily.    . meclizine (ANTIVERT) 12.5 MG tablet Take 1 tablet (12.5 mg total) by mouth 3 (three) times daily as needed for dizziness.    . Menthol, Topical Analgesic, (BIOFREEZE EX) Apply topically daily as needed (pain).     . metroNIDAZOLE  (METROCREAM) 0.75 % cream Apply 1 application topically as needed.    . Multiple Vitamins-Minerals (ALIVE WOMENS 50+ PO) Take 1 tablet by mouth daily.    Marland Kitchen neomycin-polymyxin b-dexamethasone (MAXITROL) 3.5-10000-0.1 OINT Place 1 application into both eyes at bedtime as needed.    . nitroGLYCERIN (NITROLINGUAL) 0.4 MG/SPRAY spray Place 1 spray under the tongue every 5 (five) minutes as needed for chest pain.    . NON FORMULARY Take 1 tablet by mouth daily as needed (bloat). FD GUARD    . ondansetron (ZOFRAN) 4 MG tablet Take 1 tablet (4 mg total) by mouth every 8 (eight) hours as needed for nausea or vomiting.    . Probiotic Product (PROBIOTIC DAILY PO) Take 1 capsule by mouth daily.    Marland Kitchen senna (SENOKOT) 8.6 MG TABS tablet Take 1 tablet by mouth daily as needed.     . zolpidem (AMBIEN) 10 MG tablet TAKE ONE TABLET BY MOUTH DAILY AT BEDTIME.     Review of Systems PER HPI OTHERWISE ALL SYSTEMS ARE NEGATIVE.    Objective:   Physical Exam Constitutional:      General: She is not in acute distress.    Appearance: Normal appearance.  HENT:     Mouth/Throat:     Comments: MASK IN PLACE Eyes:     General: No scleral icterus.    Pupils: Pupils are equal, round, and reactive to light.  Neck:     Musculoskeletal: Normal range of motion.  Cardiovascular:     Rate and Rhythm: Normal rate and regular rhythm.     Pulses: Normal pulses.     Heart sounds: Normal heart sounds.  Pulmonary:     Effort: Pulmonary effort is normal.     Breath sounds: Normal breath sounds.  Abdominal:     General: Bowel sounds are normal.     Palpations: Abdomen is soft.     Tenderness: There is no abdominal tenderness.  Genitourinary:      Comments: PAIN IN RIGHT LATERAL PERIANAL  REGION Musculoskeletal:     Right lower leg: No edema.     Left lower leg: No edema.  Lymphadenopathy:     Cervical: No cervical adenopathy.  Skin:    General: Skin is warm and dry.  Neurological:     Mental Status: She is alert  and oriented to person, place, and time.     Comments: NO  NEW FOCAL DEFICITS  Psychiatric:        Mood and Affect: Mood normal.     Comments: NORMAL AFFECT       Assessment & Plan:

## 2019-08-17 NOTE — Progress Notes (Signed)
cc'ed to pcp °

## 2019-08-17 NOTE — Assessment & Plan Note (Signed)
ASSOCIATED WITH TENDERNESS IN R MID PERIANAL REGION. SYMPTOMS NOT CONTROLLED AND PT HAS FAILED MEDICAL MANAGEMENT.  DRINK WATER TO KEEP YOUR URINE LIGHT YELLOW. DISCUSSED MANAGEMENT OPTIONS. PT HESITANT TO SEE SURGERY DUE TO COVID. WILL PLACE REFERRAL FOR 6 MO APPT. CONTINUE LIDOCAINE AND NTG OINTMENT FOUR TIMES A DAY FOR ONE MONTH. THEN HOLD FOR ONE MONTH. ALTERNATE UNTIL YOU SEE SURGERY AT [facility]. CONTINUE MAGNESIUM TO AVOID CONSTIPATION OR SOLID STOOL. FOLLOW UP IN 4 MOS.

## 2019-08-17 NOTE — Patient Instructions (Addendum)
DRINK WATER TO KEEP YOUR URINE LIGHT YELLOW.  CONTINUE LIDOCAINE AND NTG OINTMENT FOUR TIMES A DAY FOR ONE MONTH. THEN HOLD FOR ONE MONTH. ALTERNATE UNTIL YOU SEE SURGERY AT [facility].  CONTINUE MAGNESIUM TO AVOID CONSTIPATION OR SOLID STOOL.   FOLLOW UP IN 4 MOS.

## 2019-08-17 NOTE — Progress Notes (Signed)
On recall  °

## 2019-08-23 ENCOUNTER — Other Ambulatory Visit: Payer: Self-pay

## 2019-08-23 ENCOUNTER — Ambulatory Visit (HOSPITAL_COMMUNITY)
Admission: RE | Admit: 2019-08-23 | Discharge: 2019-08-23 | Disposition: A | Discharge status: Home / Self Care | Payer: Medicare Other | Source: Ambulatory Visit | Attending: Cardiology | Admitting: Cardiology

## 2019-08-23 ENCOUNTER — Ambulatory Visit (INDEPENDENT_AMBULATORY_CARE_PROVIDER_SITE_OTHER): Payer: Medicare Other | Admitting: *Deleted

## 2019-08-23 DIAGNOSIS — R072 Precordial pain: Secondary | ICD-10-CM

## 2019-08-23 NOTE — Progress Notes (Signed)
*  PRELIMINARY RESULTS* Echocardiogram 2D Echocardiogram has been performed.  Samuel Germany 08/23/2019, 9:57 AM

## 2019-08-23 NOTE — Progress Notes (Signed)
EKG completed

## 2019-09-05 DIAGNOSIS — H401132 Primary open-angle glaucoma, bilateral, moderate stage: Secondary | ICD-10-CM | POA: Diagnosis not present

## 2019-10-18 ENCOUNTER — Encounter: Payer: Medicare Other | Admitting: Family Medicine

## 2019-10-23 ENCOUNTER — Encounter: Payer: Self-pay | Admitting: Family Medicine

## 2019-10-23 ENCOUNTER — Ambulatory Visit (INDEPENDENT_AMBULATORY_CARE_PROVIDER_SITE_OTHER): Payer: Medicare Other | Admitting: Family Medicine

## 2019-10-23 ENCOUNTER — Other Ambulatory Visit: Payer: Self-pay

## 2019-10-23 DIAGNOSIS — B349 Viral infection, unspecified: Secondary | ICD-10-CM

## 2019-10-23 DIAGNOSIS — J0100 Acute maxillary sinusitis, unspecified: Secondary | ICD-10-CM

## 2019-10-23 DIAGNOSIS — G5621 Lesion of ulnar nerve, right upper limb: Secondary | ICD-10-CM

## 2019-10-23 MED ORDER — AMOXICILLIN 875 MG PO TABS: 875.0000 mg | ORAL_TABLET | Freq: Two times a day (BID) | ORAL | 0 refills | Status: DC

## 2019-10-23 NOTE — Progress Notes (Signed)
Virtual Visit via Telephone Note  I connected with [name] on 10/23/19 at 11:42am  by telephone and verified that I am speaking with the correct person using two identifiers.     Pt location: at home   Physician location:  In office, [facility], [name] MD     On call: patient and physician   I discussed the limitations, risks, security and privacy concerns of performing an evaluation and management service by telephone and the availability of in person appointments. I also discussed with the patient that there may be a patient responsible charge related to this service. The patient expressed understanding and agreed to proceed.   History of Present Illness: Pt seen at the dentist last week and a crown was placed. She otherwise has not left the house, her groceries are delivered. Friday night started feeling bad with headache, dry throat, mild sinus pressure. Over the weekend GI symptoms started, diarrhea started yesterday and nausea. No cough. She is able to eat small amounts .   Fever spiked this morning around 2am 99.26F,  she took fever reducer. She has pain on left cheek/ gumline where she had tooth fixed/crown She has difficulty breathing out of left nares   She has taken zofran for nausea  She was having pain in right elbow along with  numbness in her 4th and 5th fingers the past week or so., no known injury , she took some old naproxyn and that helped some.  The tingling and numbness has now resolved.  She still has some mild soreness over the lateral elbow but no swelling.   Observations/Objective: NAD noted on phone   Assessment and Plan: Acute sinusitis/dental pain-concern for possible bacterial infection in the sinuses after dental procedure but also she has some viral symptoms concerning for COVID-19.  I will go ahead and start her on amoxicillin.  She will also get COVID-19 swab.  Continue to push fluids.  She can use her Bentyl for her  increased stools she also has nausea medication. also use her nasal saline  Right arm pain-  Concern for possible ulnar nerve irritation, symptoms have improved   Follow Up Instructions:    I discussed the assessment and treatment plan with the patient. The patient was provided an opportunity to ask questions and all were answered. The patient agreed with the plan and demonstrated an understanding of the instructions.   The patient was advised to call back or seek an in-person evaluation if the symptoms worsen or if the condition fails to improve as anticipated.  I provided 20 minutes of non-face-to-face time during this encounter. End Time: 12:02am   [name], MD

## 2019-10-24 ENCOUNTER — Ambulatory Visit: Payer: Medicare Other | Attending: Internal Medicine

## 2019-10-24 DIAGNOSIS — Z20822 Contact with and (suspected) exposure to covid-19: Secondary | ICD-10-CM | POA: Diagnosis not present

## 2019-10-25 LAB — NOVEL CORONAVIRUS, NAA: SARS-CoV-2, NAA: NOT DETECTED

## 2019-11-09 ENCOUNTER — Encounter: Payer: Self-pay | Admitting: Gastroenterology

## 2019-11-16 DIAGNOSIS — Z23 Encounter for immunization: Secondary | ICD-10-CM | POA: Diagnosis not present

## 2019-11-17 ENCOUNTER — Other Ambulatory Visit: Payer: Self-pay

## 2019-11-17 MED ORDER — ONDANSETRON HCL 4 MG PO TABS: 4.0000 mg | ORAL_TABLET | Freq: Three times a day (TID) | ORAL | 1 refills | Status: DC | PRN

## 2019-11-21 ENCOUNTER — Telehealth: Payer: Self-pay | Admitting: Family Medicine

## 2019-11-21 NOTE — Telephone Encounter (Signed)
Please send to the [employer] billing people I do not think it is appropriate for me to be writing letters to get some one to pay for an OV

## 2019-11-21 NOTE — Telephone Encounter (Signed)
Patient is calling in reference to her outstanding balance for DOS 1.18.2021. Lurena [facility] has denied paying since it was not a virtual visit and that it was only a telephone visit. They told her that they would pay for it if we would send a letter to appeal. Patient states that it was set up as a virtual visit but she was unable to connect with her cell because of her cell service.    [place], Decision Review Appeal [id number], [place] [address]  CB# [phone]

## 2019-11-30 ENCOUNTER — Other Ambulatory Visit: Payer: Self-pay

## 2019-11-30 ENCOUNTER — Encounter: Payer: Self-pay | Admitting: Gastroenterology

## 2019-11-30 ENCOUNTER — Ambulatory Visit (INDEPENDENT_AMBULATORY_CARE_PROVIDER_SITE_OTHER): Payer: Medicare Other | Admitting: Gastroenterology

## 2019-11-30 DIAGNOSIS — K625 Hemorrhage of anus and rectum: Secondary | ICD-10-CM

## 2019-11-30 NOTE — Patient Instructions (Signed)
See surgeons at [facility].  USE LIDOCAINE WHEN NEEDED FOR PAIN RELIEF.  DRINK WATER TO KEEP YOUR URINE LIGHT YELLOW.  EAT THE FIBER YOU CAN TOLERATE.  FOLLOW UP IN 6 MOS.

## 2019-11-30 NOTE — Progress Notes (Signed)
Subjective:    Patient ID: [name], female    DOB: 28-May-1951, 69 y.o.   MRN: [id number]  Alycia Rossetti, MD  HPI IT'S HARD TO GET TO [facility]. WOULD HAVE TO PAY TO GO BACK AND FORTH. CREAMS ACTUALLY HELP/ FELT LIKE BLISTERS BACK THERE AT TOP OF ANUS SORE. LIFTING WOOD FOR FIREPLACE DURING POWER OUTAGE. SEEING RECTAL BLEEDING EVERY TIME SHE HAS A BM.  Marland Kitchen FEEL LIKE NEW SCAR TISSUE. BMs: AFTER Mg MOVES 3-5X/DAY(BSC 2,6, OR 4). TRIES TO HAVE ALL BMs IN THE AM. CAN'T COMPLETELY EVACUATE.WAKES UP NEEDS TO HAVE A BM, NAUSEA AND THEN SHE GOES. AFTER FIRST COVID VACCINE CONSTANT NAUSEA. HAD SKIN REACTION TO VACCINE ONE WEEK LATER. NOW BETTER. STILL HAS LOWER ABDOMINAL CRAMP(RIPPING) PRIOR TO BM. TOO MANY MAGNESIUM GIVES HER BSC #7. DRINKING BAKING SODA HELPS WITH BLOATING. LIDOCAINE HELPS HER RECTAL DISCOMFORT. NTG DIDN'T WORK AS WELL. DOESN'T TOLERATE RAW VEGETABLES OR TOMATOES CAUSE DIARRHEA. CAN ONLY EAT FISH/TURKEY. CHICKEN MAKES HER FACE BREAK OUT.  IF GONNA HAVE A FLARE FACE BREAKS OUT AND THEN BOWELS ACT UP.  PT DENIES FEVER, CHILLS, HEMATEMESIS, vomiting, melena, CHEST PAIN, SHORTNESS OF BREATH, CHANGE IN BOWEL IN HABITS,  problems swallowing, OR heartburn or indigestion.  Past Medical History:  Diagnosis Date  . Anxiety   . Arthritis   . Asthma   . Chest pain    a. 02/2000 Cath: nl cors, EF 60%;  b. November 07, 2010 MV: nl LV, no ischemia/infarct;  c. 03/2014 Admit c/p, r/o->grief from husbands death.  . Chronic RUQ pain 2007/11/08   EUS slightly dilated CBD (7.87m), otherwise nl  . Depression   . Eczema   . Exposure to chemical inhalation mid 1970s  . Fatty liver   . G6PD deficiency   . GERD (gastroesophageal reflux disease)   . Glaucoma   . History of pulmonary embolus (PE)    Treated with Eliquis 203-Feb-2016 . HTN (hypertension)   . Hypercholesteremia    a. intolerant to statins.  . IBS (irritable bowel syndrome)   . Kidney stone   . 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 Palpitations    a. 05/2008 Echo: nl LV fxn.  .Marland KitchenPONV (postoperative nausea and vomiting)   . Recurrent upper respiratory infection (URI)   . Renal cyst   . Sickle cell trait ([place]   . Tubular adenoma of colon 09/17/2011   12/2010 Next colonoscopy 12/2015   . Urticaria    Past Medical History:  Diagnosis Date  . Anxiety   . Arthritis   . Asthma   . Chest pain    a. 02/2000 Cath: nl cors, EF 60%;  b. 1February 03, 2012MV: nl LV, no ischemia/infarct;  c. 03/2014 Admit c/p, r/o->grief from husbands death.  . Chronic RUQ pain 202-12-2007  EUS slightly dilated CBD (7.544m, otherwise nl  . Depression   . Eczema   . Exposure to chemical inhalation mid 1970s  . Fatty liver   . G6PD deficiency   . GERD (gastroesophageal reflux disease)   . Glaucoma   . History of pulmonary embolus (PE)    Treated with Eliquis 2000/12/18. HTN (hypertension)   . Hypercholesteremia    a. intolerant to statins.  . IBS (irritable bowel syndrome)   . Kidney stone   . 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 Palpitations    a. 05/2008 Echo: nl LV fxn.  .Marland Kitchen  PONV (postoperative nausea and vomiting)   . Recurrent upper respiratory infection (URI)   . Renal cyst   . Sickle cell trait ([place])   . Tubular adenoma of colon 09/17/2011   12/2010 Next colonoscopy 12/2015   . Urticaria     Allergies  Allergen Reactions  . Adhesive [Tape] Other (See Comments)    Blisters skin  . Aspirin Other (See Comments)    sickle cell trait--  Not recommended unless emergency  . Keflex [Cephalexin] Hives  . Levaquin [Levofloxacin In D5w]     Altered mental status   . Other     Nut Oil- skin irritation   . Statins Other (See Comments)    Myopathy - elevated CK  . Sulfonamide Derivatives Other (See Comments)    Patient has sickle cell trait  . Latex Rash    Current Outpatient Medications  Medication Sig    . albuterol (PROVENTIL) (2.5 MG/3ML) 0.083% nebulizer solution Take 3 mLs (2.5 mg total) by nebulization every 4  (four) hours as needed for wheezing or shortness of breath.    Marland Kitchen albuterol (VENTOLIN HFA) 108 (90 Base) MCG/ACT inhaler Inhale 2 puffs into the lungs every 4 (four) hours as needed for wheezing.    Marland Kitchen ALPRAZolam (XANAX) 1 MG tablet Take 1 tablet (1 mg total) by mouth 2 (two) times daily as needed for anxiety.    . brimonidine (ALPHAGAN) 0.2 % ophthalmic solution Place 1 drop into both eyes 2 (two) times daily.    . budesonide-formoterol (SYMBICORT) 160-4.5 MCG/ACT inhaler Inhale 2 puffs into the lungs 2 (two) times daily. (Patient taking differently: Inhale 2 puffs into the lungs as needed. )    . chlorpheniramine-HYDROcodone (TUSSIONEX PENNKINETIC ER) 10-8 MG/5ML SUER Take 5 mLs by mouth every 12 (twelve) hours as needed.    . cyclobenzaprine (FLEXERIL) 10 MG tablet TAKE ONE TABLET BY MOUTH DAILY  as needed muscle spasms    . dexlansoprazole (DEXILANT) 60 MG capsule Take 1 capsule (60 mg total) by mouth daily.    Marland Kitchen dicyclomine (BENTYL) 10 MG capsule Take one capsule before meals and at bedtime AS NEEDED for abdominal pain and frequent stool. HOLD for constipation    . Ergocalciferol (VITAMIN D2) 50 MCG (2000 UT) TABS Take 2,000 Units by mouth daily.    Marland Kitchen estradiol (ESTRACE VAGINAL) 0.1 MG/GM vaginal cream Place 1 Applicatorful vaginally daily as needed.    . fenofibrate (TRICOR) 145 MG tablet Take 1 tablet (145 mg total) by mouth daily.    . hydrochlorothiazide (HYDRODIURIL) 12.5 MG tablet Take 1 tablet (12.5 mg total) by mouth daily.    Marland Kitchen HYDROcodone-acetaminophen (NORCO) 5-325 MG tablet Take 1 tablet by mouth every 6 (six) hours as needed for moderate pain.    Marland Kitchen irbesartan (AVAPRO) 150 MG tablet Take 0.5 tablets (75 mg total) by mouth daily.    Marland Kitchen latanoprost (XALATAN) 0.005 % ophthalmic solution Place 1 drop into both eyes at bedtime.    . Lidocaine-Hydrocortisone Ace 3-2.5 % KIT APPLY TO RECTUM QID for 2 weeks then AS NEEDED FOR RECTAL PAIN OR BLEEDING    . Lifitegrast (XIIDRA) 5 % SOLN Place 1  drop into both eyes daily as needed.    . magnesium oxide (HM MAGNESIUM) 400 MG tablet Take 2 tablets (800 mg total) by mouth daily. (Patient taking differently: Take 400 mg by mouth daily. Takes 1 tablet daily.)    . meclizine (ANTIVERT) 12.5 MG tablet Take 1 tablet (12.5 mg total) by mouth 3 (three)  times daily as needed for dizziness. (Patient taking differently: Take 12.5 mg by mouth as needed for dizziness. )    . Menthol, Topical Analgesic, (BIOFREEZE EX) Apply topically daily as needed (pain).     . Multiple Vitamins-Minerals (ALIVE WOMENS 50+ PO) Take 1 tablet by mouth daily.    Marland Kitchen neomycin-polymyxin b-dexamethasone (MAXITROL) 3.5-10000-0.1 OINT Place 1 application into both eyes at bedtime as needed.    . nitroGLYCERIN (NITROLINGUAL) 0.4 MG/SPRAY spray Place 1 spray under the tongue every 5 (five) minutes as needed for chest pain.    . Nitroglycerin 0.4 % OINT Place 1 drop rectally 3 (three) times daily.    . NON FORMULARY Take 1 tablet by mouth daily as needed (bloat). FD GUARD    . ondansetron (ZOFRAN) 4 MG tablet Take 1 tablet (4 mg total) by mouth every 8 (eight) hours as needed for nausea or vomiting. (Patient taking differently: Take 4 mg by mouth as needed for nausea or vomiting. )    . Probiotic Product (PROBIOTIC DAILY PO) Take 1 capsule by mouth daily.    Marland Kitchen senna (SENOKOT) 8.6 MG TABS tablet Take 1 tablet by mouth daily as needed.     . zolpidem (AMBIEN) 10 MG tablet TAKE ONE TABLET BY MOUTH DAILY AT BEDTIME.    .      .       Review of Systems PER HPI OTHERWISE ALL SYSTEMS ARE NEGATIVE.    Objective:   Physical Exam Constitutional:      General: She is not in acute distress.    Appearance: Normal appearance.  HENT:     Mouth/Throat:     Comments: MASK IN PLACE Eyes:     General: No scleral icterus.    Pupils: Pupils are equal, round, and reactive to light.  Cardiovascular:     Rate and Rhythm: Normal rate and regular rhythm.     Pulses: Normal pulses.     Heart  sounds: Normal heart sounds.  Pulmonary:     Effort: Pulmonary effort is normal.     Breath sounds: Normal breath sounds.  Abdominal:     General: Bowel sounds are normal.     Palpations: Abdomen is soft.     Tenderness: There is no abdominal tenderness.  Genitourinary:      Comments: FIBROUS BAND, SKIN INTACT, POST SURGICAL CHANGES EVIDENT Musculoskeletal:     Cervical back: Normal range of motion.     Right lower leg: No edema.     Left lower leg: No edema.  Lymphadenopathy:     Cervical: No cervical adenopathy.  Skin:    General: Skin is warm and dry.  Neurological:     Mental Status: She is alert and oriented to person, place, and time.     Comments: NO  NEW FOCAL DEFICITS  Psychiatric:        Mood and Affect: Mood normal.     Comments: NORMAL AFFECT       Assessment & Plan:

## 2019-11-30 NOTE — Assessment & Plan Note (Signed)
ETIOLOGY UNCLEAR BUT MAY BE RELATED TO ANAL FISSURE.  See surgeons at [facility]. USE LIDOCAINE WHEN NEEDED FOR PAIN RELIEF. DRINK WATER TO KEEP YOUR URINE LIGHT YELLOW. EAT THE FIBER YOU CAN TOLERATE. FOLLOW UP IN 6 MOS.

## 2019-11-30 NOTE — Progress Notes (Signed)
Cc'ed to pcp °

## 2019-12-05 ENCOUNTER — Ambulatory Visit (INDEPENDENT_AMBULATORY_CARE_PROVIDER_SITE_OTHER): Payer: Medicare Other | Admitting: General Surgery

## 2019-12-05 ENCOUNTER — Encounter: Payer: Self-pay | Admitting: Gastroenterology

## 2019-12-05 ENCOUNTER — Encounter: Payer: Self-pay | Admitting: General Surgery

## 2019-12-05 ENCOUNTER — Other Ambulatory Visit: Payer: Self-pay

## 2019-12-05 VITALS — BP 118/75 | HR 94 | Temp 98.4°F | Resp 12 | Ht 67.0 in | Wt 221.0 lb

## 2019-12-05 DIAGNOSIS — K6289 Other specified diseases of anus and rectum: Secondary | ICD-10-CM

## 2019-12-05 DIAGNOSIS — K601 Chronic anal fissure: Secondary | ICD-10-CM | POA: Diagnosis not present

## 2019-12-05 NOTE — Patient Instructions (Addendum)
Stop using the nitroglycerin. Start using the diltiazem /lidocaine compound four times daily for the next few weeks.

## 2019-12-05 NOTE — Progress Notes (Signed)
[facility] History and Physical  Reason for Referral: Chronic anal fissure/ Rectal pain/ bleeding  Referring Physician:  Dr. Oneida Alar   Chief Complaint    Follow-up      [name] is a 69 y.o. female.  HPI: [name] is a very sweet 69 yo with IBS, component of constipation and diarrhea pending her medication use (miralax etc) who has had chronic anal pain and bleeding since a hemorrhoidectomy done in 2015-11-25. She had a colonoscopy in 11-25-15 and noted to have extensive hemorrhoids and was having rectal pain and bleeding. She had prior banding but this had been unsuccessful in the years leading up to the hemorrhoidectomy.  She was referred to Dr. Rosana Hoes who performed an extensive hemorrhoidectomy of 2 columns per the operative note. She says that after surgery she saw Dr. Rosana Hoes and Dr. Oneida Alar and reports ulceration and redness of the perianal area after the procedure and plan for antibiotics by Dr. Rosana Hoes. I cannot find the note for this encounter, and then she was seen by Dr. Oneida Alar for continued pain and had a repeat flexible sigmoidoscopy that has pictures of the raw appearing perianal area in September which was 2 months after her hemorrhoidectomy.  Starting in October 2017, she was identified as having a posterior fissure, and has been prescribed nitroglycerin and lidocaine for the area in the past.   She says that her pain and bleeding prior the hemorrhoidectomy were nothing compared to the pain in the recent years. She says her pain is sharp and associated with Bms, and she gets some minor relief with the lidocaine. In the past when she has been able to use the nitroglycerin she says that she had almost complete relief but the nitroglycerin causes her significant headaches, so she cannot use it often.   She says that her current bowels are lose and she has significant clots that are passed regularly. She can have as many as 4-5 Bms a day. She has urgency when she needs a BM  but does not express leakage, but says she has to run to the bathroom to prevent an accident.  She says that she has to take imodium at times when she gets on flights to prevent from having urgency and accidents.    She had her last full colonoscopy in 2015-11-25, and was told to get a repeat in 5 years due to an extensive family history.  She says she is worried that she has a cancer somewhere and this is causing the bleeding and blood clots.    Past Medical History:  Diagnosis Date  . Anxiety   . Arthritis   . Asthma   . Chest pain    a. 02/2000 Cath: nl cors, EF 60%;  b. 10/2010 MV: nl LV, no ischemia/infarct;  c. 03/2014 Admit c/p, r/o->grief from husbands death.  . Chronic RUQ pain 11/25/07   EUS slightly dilated CBD (7.23m), otherwise nl  . Depression   . Eczema   . Exposure to chemical inhalation mid 1970s  . Fatty liver   . G6PD deficiency   . GERD (gastroesophageal reflux disease)   . Glaucoma   . History of pulmonary embolus (PE)    Treated with Eliquis 22016-02-20 . HTN (hypertension)   . Hypercholesteremia    a. intolerant to statins.  . IBS (irritable bowel syndrome)   . Kidney stone   . 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 Palpitations  a. 05/2008 Echo: nl LV fxn.  Marland Kitchen PONV (postoperative nausea and vomiting)   . Recurrent upper respiratory infection (URI)   . Renal cyst   . Sickle cell trait ([place])   . Tubular adenoma of colon 09/17/2011   12/2010 Next colonoscopy 12/2015   . Urticaria     Past Surgical History:  Procedure Laterality Date  . ABDOMINAL HYSTERECTOMY    . ADENOIDECTOMY    . ANGIOPLASTY    . APPENDECTOMY    . BIOPSY N/A 03/14/2013   Procedure: SMALL BOWEL AND GASTRIC BIOPSIES (Procedure #1);  Surgeon: Danie Binder, MD;  Location: [facility];  Service: Endoscopy;  Laterality: N/A;  . CARDIAC CATHETERIZATION Left 02/11/2000  . CATARACT EXTRACTION Left   . CHOLECYSTECTOMY  12/2007  . COLONOSCOPY  12/22/10   QTM:AUQJFH, cecal adenomatous polyp  .  COLONOSCOPY WITH PROPOFOL N/A 03/31/2016   Dr. Oneida Alar: four sessile polyps rectum/sigmoid colon, diverticulosi, ext/int hemorrhoids. hyperplastic polyps, next tcs 5 years.  . complete hysterectomy    . ENTEROSCOPY N/A 03/14/2013   LKT:GYBW gastritis/ulcers has healed  . ESOPHAGOGASTRODUODENOSCOPY  09/22/2011   LSL:HTDS gastritis/Duodenitis  . FLEXIBLE SIGMOIDOSCOPY N/A 03/14/2013   SLF:3 colon polyp removed/moderate sized internal hemorrhoids  . FLEXIBLE SIGMOIDOSCOPY N/A 06/09/2016   Procedure: FLEXIBLE SIGMOIDOSCOPY;  Surgeon: Danie Binder, MD;  Location: [facility];  Service: Endoscopy;  Laterality: N/A;  rectal polyps times 2  . FLEXIBLE SIGMOIDOSCOPY N/A 03/18/2018   Procedure: FLEXIBLE SIGMOIDOSCOPY;  Surgeon: Danie Binder, MD;  Location: [facility];  Service: Endoscopy;  Laterality: N/A;  9:15am  . FOOT SURGERY     bunion removal left  . GIVENS CAPSULE STUDY N/A 02/10/2013   Procedure: GIVENS CAPSULE STUDY;  Surgeon: Danie Binder, MD;  Location: [facility];  Service: Endoscopy;  Laterality: N/A;  730  . HEEL SPUR EXCISION     right   . HEMORRHOID BANDING N/A 03/14/2013   Procedure: HEMORRHOID BANDING (Procedure #3)  3 bands applied KAJ#[id number] Exp 02/02/2014 ;  Surgeon: Danie Binder, MD;  Location: [facility];  Service: Endoscopy;  Laterality: N/A;  . HEMORRHOID SURGERY N/A 04/24/2016   Procedure: EXTENSIVE HEMORRHOIDECTOMY;  Surgeon: Vickie Epley, MD;  Location: [facility];  Service: General;  Laterality: N/A;  . LAPAROSCOPY     adhesions  . POLYPECTOMY N/A 03/14/2013   OMB:TDHR Gastritis . ULCERS SEEN ON MAY 6 HAVE HEALED  . POLYPECTOMY  03/31/2016   Procedure: POLYPECTOMY;  Surgeon: Danie Binder, MD;  Location: [facility];  Service: Endoscopy;;  sigmoid colon polyps x2, rectal polyps x2  . TONSILLECTOMY    . TUBAL LIGATION      Family History  Problem Relation Age of Onset  . Colon cancer Mother        85s  . Urticaria Mother   . Cancer Father        oral  .  Crohn's disease Sister   . Colon cancer Maternal Grandfather   . Colon cancer Paternal Grandfather   . Diabetes Brother   . Colon cancer Maternal Grandmother   . Anesthesia problems Neg Hx     Social History   Tobacco Use  . Smoking status: Former Smoker    Packs/day: 0.50    Years: 30.00    Pack years: 15.00    Types: Cigarettes  . Smokeless tobacco: Former Systems developer    Quit date: 10/15/1996  . Tobacco comment: Stopped smoking ~ 2000  Substance Use Topics  .  Alcohol use: Yes    Comment: occassion  . Drug use: No    Medications: I have reviewed the patient's current medications. Allergies as of 12/05/2019      Reactions   Adhesive [tape] Other (See Comments)   Blisters skin   Aspirin Other (See Comments)   sickle cell trait--  Not recommended unless emergency   Keflex [cephalexin] Hives   Levaquin [levofloxacin In D5w]    Altered mental status   Other    Nut Oil- skin irritation   Statins Other (See Comments)   Myopathy - elevated CK   Sulfonamide Derivatives Other (See Comments)   Patient has sickle cell trait   Latex Rash      Medication List       Accurate as of December 05, 2019 10:56 AM. If you have any questions, ask your nurse or doctor.        albuterol 108 (90 Base) MCG/ACT inhaler Commonly known as: VENTOLIN HFA Inhale 2 puffs into the lungs every 4 (four) hours as needed for wheezing.   albuterol (2.5 MG/3ML) 0.083% nebulizer solution Commonly known as: PROVENTIL Take 3 mLs (2.5 mg total) by nebulization every 4 (four) hours as needed for wheezing or shortness of breath.   ALIVE WOMENS 50+ PO Take 1 tablet by mouth daily.   ALPRAZolam 1 MG tablet Commonly known as: XANAX Take 1 tablet (1 mg total) by mouth 2 (two) times daily as needed for anxiety.   amoxicillin 875 MG tablet Commonly known as: AMOXIL Take 1 tablet (875 mg total) by mouth 2 (two) times daily.   BIOFREEZE EX Apply topically daily as needed (pain).   brimonidine 0.2 % ophthalmic  solution Commonly known as: ALPHAGAN Place 1 drop into both eyes 2 (two) times daily.   budesonide-formoterol 160-4.5 MCG/ACT inhaler Commonly known as: Symbicort Inhale 2 puffs into the lungs 2 (two) times daily. What changed:   when to take this  reasons to take this   chlorpheniramine-HYDROcodone 10-8 MG/5ML Suer Commonly known as: Tussionex Pennkinetic ER Take 5 mLs by mouth every 12 (twelve) hours as needed.   cyclobenzaprine 10 MG tablet Commonly known as: FLEXERIL TAKE ONE TABLET BY MOUTH DAILY  as needed muscle spasms   Dexilant 60 MG capsule Generic drug: dexlansoprazole Take 1 capsule (60 mg total) by mouth daily.   dicyclomine 10 MG capsule Commonly known as: BENTYL Take one capsule before meals and at bedtime AS NEEDED for abdominal pain and frequent stool. HOLD for constipation   estradiol 0.1 MG/GM vaginal cream Commonly known as: ESTRACE VAGINAL Place 1 Applicatorful vaginally daily as needed.   fenofibrate 145 MG tablet Commonly known as: TRICOR Take 1 tablet (145 mg total) by mouth daily.   hydrochlorothiazide 12.5 MG tablet Commonly known as: HYDRODIURIL Take 1 tablet (12.5 mg total) by mouth daily.   HYDROcodone-acetaminophen 5-325 MG tablet Commonly known as: Norco Take 1 tablet by mouth every 6 (six) hours as needed for moderate pain.   irbesartan 150 MG tablet Commonly known as: AVAPRO Take 0.5 tablets (75 mg total) by mouth daily.   latanoprost 0.005 % ophthalmic solution Commonly known as: XALATAN Place 1 drop into both eyes at bedtime.   Lidocaine-Hydrocortisone Ace 3-2.5 % Kit APPLY TO RECTUM QID for 2 weeks then AS NEEDED FOR RECTAL PAIN OR BLEEDING   magnesium oxide 400 MG tablet Commonly known as: HM Magnesium Take 2 tablets (800 mg total) by mouth daily. What changed:   how much to take  additional instructions   meclizine 12.5 MG tablet Commonly known as: ANTIVERT Take 1 tablet (12.5 mg total) by mouth 3 (three) times  daily as needed for dizziness. What changed: when to take this   metroNIDAZOLE 0.75 % cream Commonly known as: METROCREAM Apply 1 application topically as needed.   neomycin-polymyxin b-dexamethasone 3.5-10000-0.1 Oint Commonly known as: MAXITROL Place 1 application into both eyes at bedtime as needed.   Nitroglycerin 0.4 % Oint Place 1 drop rectally 3 (three) times daily.   nitroGLYCERIN 0.4 MG/SPRAY spray Commonly known as: NITROLINGUAL Place 1 spray under the tongue every 5 (five) minutes as needed for chest pain.   NON FORMULARY Take 1 tablet by mouth daily as needed (bloat). FD GUARD   ondansetron 4 MG tablet Commonly known as: Zofran Take 1 tablet (4 mg total) by mouth every 8 (eight) hours as needed for nausea or vomiting. What changed: when to take this   PROBIOTIC DAILY PO Take 1 capsule by mouth daily.   senna 8.6 MG Tabs tablet Commonly known as: SENOKOT Take 1 tablet by mouth daily as needed.   Vitamin D2 50 MCG (2000 UT) Tabs Take 2,000 Units by mouth daily.   Xiidra 5 % Soln Generic drug: Lifitegrast Place 1 drop into both eyes daily as needed.   zolpidem 10 MG tablet Commonly known as: AMBIEN TAKE ONE TABLET BY MOUTH DAILY AT BEDTIME.        ROS:  A comprehensive review of systems was negative except for: Gastrointestinal: positive for abdominal pain, constipation, diarrhea and perianal pain with defecation, raw burning pain, bleeding per rectum  Blood pressure 118/75, pulse 94, temperature 98.4 F (36.9 C), temperature source Oral, resp. rate 12, height _0  (1.702 m), weight 221 lb (100.2 kg), SpO2 92 %. Physical Exam Vitals reviewed.  Constitutional:      Appearance: Normal appearance.  HENT:     Head: Normocephalic.     Nose: Nose normal.     Mouth/Throat:     Mouth: Mucous membranes are moist.  Eyes:     Pupils: Pupils are equal, round, and reactive to light.  Cardiovascular:     Rate and Rhythm: Normal rate and regular rhythm.   Pulmonary:     Effort: Pulmonary effort is normal.     Breath sounds: Normal breath sounds.  Abdominal:     General: There is no distension.     Palpations: Abdomen is soft.     Tenderness: There is no abdominal tenderness.  Genitourinary:    Rectum: Tenderness and anal fissure present. No mass.     Comments: Normal perianal skin, posterior chronic partially healed fissure with some rolled edges, no skin tag or pile, no external hemorrhoidal tissue, no signs anal skin stenosis, tender exam with easy entry of digit and tenderness mostly on the left and posterior walls of the anal canal, tolerated well, no overt signs of bleeding, resting tone is fair/ good but squeeze is a little weak  Musculoskeletal:        General: No swelling.     Cervical back: Normal range of motion.  Skin:    General: Skin is warm and dry.  Neurological:     General: No focal deficit present.     Mental Status: She is alert and oriented to person, place, and time.  Psychiatric:        Mood and Affect: Mood normal.        Behavior: Behavior normal.  Thought Content: Thought content normal.        Judgment: Judgment normal.     Results: None   Assessment & Plan:  [name] is a 69 y.o. 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 She has had a prior hemorrhoidectomy with reported post operative skin ulcer/ infection and issues with pain and bleeding since that time. She has had intermittent episodes of improvement when she used the nitroglycerin ointment QID prescribed by Dr. Oneida Alar but this causes her headaches, and she is unable to use it regularly.  She says that she wishes she had never had the hemorrhoidectomy performed because her pain and issues now are worse than when she had her operation.   On hearing her story I was concerned that there could be some anal skin stenosis or even sphincter injury.  Her anal skin is not stenosed and I am able to do a  digital exam. Her resting tone is fair but her squeeze is weak. She does report issues with urgency and fear of accidents but no overt leakage.     Her fissure appears chronic but is partially healed and this is encouraging.  She has had success with the nitroglycerin therapies in the past, and I have offered her the option of diltiazem 2%/ Lidocaine 5% compound for the fissure to help relax the sphincter and aid in healing to see if she can avoid operative intervention.   She has a very complex and difficult case given her issues with the prior surgery.  I have told her that her she comes to surgery, I think she would need a colorectal surgeon to fully evaluate her and give their opinion prior to surgery for a fissure (lateral internal sphincterotomy) given her prior history with the ulceration/ infection and history of diarrhea with urgency and fear of accident /leak, and decreased squeeze on exam.  I have told her that a surgical option for fissures involves cutting the sphincter and that she would be a risk for incontinence/ anal leakage due to her diarrhea and urgency. She has had prior referral to [facility] but was unable to go due to transportation issues and her only family is a son in [place].    I feel no mass in her anal canal, and it is possible that she has bleeding from the fissure or hemorrhoidal columns.  I cannot say this with 100% confidence, and have told her that she may end up needing a repeat colonoscopy sooner than 5 years given her bleeding and her family history.   I would hold on the colonoscopy until we see if the fissure can improve with the diltiazem 2% /lidocaine 5% compound QID.   Will see patient back in about 4 weeks.   Future Appointments  Date Time Provider [place]  01/09/2020  9:30 AM Virl Cagey, MD [facility] None  01/12/2020  9:30 AM Alycia Rossetti, MD [facility] None  06/11/2020 10:30 AM Annitta Needs, NP [facility]     All questions were  answered to the satisfaction of the patient.   I spent over 45 minutes with the patient discussing the case and the above recommendations.   Virl Cagey 12/05/2019, 10:56 AM

## 2019-12-15 DIAGNOSIS — Z23 Encounter for immunization: Secondary | ICD-10-CM | POA: Diagnosis not present

## 2020-01-09 ENCOUNTER — Ambulatory Visit (INDEPENDENT_AMBULATORY_CARE_PROVIDER_SITE_OTHER): Payer: Medicare Other | Admitting: General Surgery

## 2020-01-09 ENCOUNTER — Other Ambulatory Visit: Payer: Self-pay

## 2020-01-09 ENCOUNTER — Encounter: Payer: Self-pay | Admitting: General Surgery

## 2020-01-09 VITALS — BP 110/67 | HR 89 | Temp 98.5°F | Resp 12 | Ht 67.0 in | Wt 217.0 lb

## 2020-01-09 DIAGNOSIS — K6289 Other specified diseases of anus and rectum: Secondary | ICD-10-CM | POA: Diagnosis not present

## 2020-01-09 DIAGNOSIS — K601 Chronic anal fissure: Secondary | ICD-10-CM | POA: Diagnosis not present

## 2020-01-09 NOTE — Patient Instructions (Addendum)
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  Keep your [facility] Appointment.

## 2020-01-09 NOTE — Progress Notes (Signed)
[facility] Note   HPI:  69 y.o. Female presents to clinic for follow-up evaluation to discuss her chronic anal fissure and anal pain/ problems after her extensive hemorrhoidectomy several years ago with another provider.    She has had problems since. I tried to prescribe diltiazem/ lidocaine for her fissure as the nitroglycerin helped but she had headaches and did not use it. She now says she has G6PD deficiency and cannot use lidocaine.  She says she used it some. She says her anal pain is not really a big problem right now but more of the issues with loose stools, frequent stools and some leakage/ accidents.  She says she does not take imodium regularly but does have to take magnesium for her leg cramps.   She has discussed all of her concerns in depth, and we discussed extensively the worries about doing any type of surgery on her anal region due to there leakage and her anal pain. I have already told her she needed to see a colorectal specialist. She reports having an appointment in June with [facility].  She also has no support system in [place] and her only son lives in [place]. She is going to see him soon and has expressed desire to see a colorectal surgeon there as she may ultimately need to move to [place] given her worsening health and mobility and legal blindness.   Review of Systems:  Stool leakage Diarrhea Chronic anal pain/ bleeding All other review of systems: otherwise negative   Vital Signs:  BP 110/67   Pulse 89   Temp 98.5 F (36.9 C) (Oral)   Resp 12   Ht 5' 7"  (1.702 m)   Wt 217 lb (98.4 kg)   SpO2 95%   BMI 33.99 kg/m    Physical Exam:  Physical Exam Vitals reviewed.  HENT:     Head: Normocephalic.  Cardiovascular:     Rate and Rhythm: Normal rate.  Pulmonary:     Effort: Pulmonary effort is normal.  Genitourinary:    Comments: Deferred today Neurological:     General: No focal deficit present.     Mental Status: She is alert.   Psychiatric:        Mood and Affect: Mood normal.        Behavior: Behavior normal.     Assessment:  69 y.o. yo Female with a complex history and what seems to be a chronic anal fissure that is not healing and chronic anal pain and bleeding after a prior hemorrhoidectomy with post operative infection and complications.  She understands that her issues are complex and is going to see Colorectal surgery. She understands they may not recommend surgery due to her issues and may need further more extensive testing like manometry or defecography.   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  Keep your [facility] Appointment.  Follow up with me PRN.   All of the above recommendations were discussed with the patient, and all of patient's questions were answered to her expressed satisfaction.  I spent over 45 minutes in the room with the patient discussing the above and discussing her options and making the plan.   Curlene Labrum, MD [facility] [address] [place], [place] [address] [phone] (office)

## 2020-01-12 ENCOUNTER — Encounter: Payer: Self-pay | Admitting: Family Medicine

## 2020-01-12 ENCOUNTER — Other Ambulatory Visit: Payer: Self-pay | Admitting: *Deleted

## 2020-01-12 ENCOUNTER — Ambulatory Visit (INDEPENDENT_AMBULATORY_CARE_PROVIDER_SITE_OTHER): Payer: Medicare Other | Admitting: Family Medicine

## 2020-01-12 ENCOUNTER — Other Ambulatory Visit: Payer: Self-pay

## 2020-01-12 VITALS — BP 110/62 | HR 88 | Temp 97.7°F | Resp 12 | Ht 67.0 in | Wt 217.0 lb

## 2020-01-12 DIAGNOSIS — E785 Hyperlipidemia, unspecified: Secondary | ICD-10-CM

## 2020-01-12 DIAGNOSIS — M159 Polyosteoarthritis, unspecified: Secondary | ICD-10-CM | POA: Diagnosis not present

## 2020-01-12 DIAGNOSIS — Z0000 Encounter for general adult medical examination without abnormal findings: Secondary | ICD-10-CM

## 2020-01-12 DIAGNOSIS — Z0001 Encounter for general adult medical examination with abnormal findings: Secondary | ICD-10-CM

## 2020-01-12 DIAGNOSIS — I10 Essential (primary) hypertension: Secondary | ICD-10-CM

## 2020-01-12 DIAGNOSIS — J4520 Mild intermittent asthma, uncomplicated: Secondary | ICD-10-CM

## 2020-01-12 DIAGNOSIS — R946 Abnormal results of thyroid function studies: Secondary | ICD-10-CM | POA: Diagnosis not present

## 2020-01-12 DIAGNOSIS — K219 Gastro-esophageal reflux disease without esophagitis: Secondary | ICD-10-CM

## 2020-01-12 DIAGNOSIS — M5137 Other intervertebral disc degeneration, lumbosacral region: Secondary | ICD-10-CM | POA: Diagnosis not present

## 2020-01-12 DIAGNOSIS — K7581 Nonalcoholic steatohepatitis (NASH): Secondary | ICD-10-CM | POA: Diagnosis not present

## 2020-01-12 DIAGNOSIS — Z23 Encounter for immunization: Secondary | ICD-10-CM

## 2020-01-12 DIAGNOSIS — F330 Major depressive disorder, recurrent, mild: Secondary | ICD-10-CM

## 2020-01-12 DIAGNOSIS — H401130 Primary open-angle glaucoma, bilateral, stage unspecified: Secondary | ICD-10-CM | POA: Insufficient documentation

## 2020-01-12 DIAGNOSIS — H401132 Primary open-angle glaucoma, bilateral, moderate stage: Secondary | ICD-10-CM

## 2020-01-12 DIAGNOSIS — E6609 Other obesity due to excess calories: Secondary | ICD-10-CM | POA: Diagnosis not present

## 2020-01-12 DIAGNOSIS — Z6833 Body mass index (BMI) 33.0-33.9, adult: Secondary | ICD-10-CM

## 2020-01-12 DIAGNOSIS — N182 Chronic kidney disease, stage 2 (mild): Secondary | ICD-10-CM | POA: Diagnosis not present

## 2020-01-12 MED ORDER — ESCITALOPRAM OXALATE 5 MG PO TABS: 5.0000 mg | ORAL_TABLET | Freq: Every day | ORAL | 1 refills | Status: DC

## 2020-01-12 MED ORDER — ZOLPIDEM TARTRATE 10 MG PO TABS: ORAL_TABLET | ORAL | 3 refills | Status: DC

## 2020-01-12 MED ORDER — HYDROCODONE-ACETAMINOPHEN 5-325 MG PO TABS: 1.0000 | ORAL_TABLET | Freq: Four times a day (QID) | ORAL | 0 refills | Status: DC | PRN

## 2020-01-12 MED ORDER — IRBESARTAN 150 MG PO TABS: 75.0000 mg | ORAL_TABLET | Freq: Every day | ORAL | 3 refills | Status: DC

## 2020-01-12 MED ORDER — NAPROXEN 500 MG PO TABS: 500.0000 mg | ORAL_TABLET | Freq: Two times a day (BID) | ORAL | 1 refills | Status: DC

## 2020-01-12 MED ORDER — BUDESONIDE-FORMOTEROL FUMARATE 160-4.5 MCG/ACT IN AERO: 2.0000 | INHALATION_SPRAY | RESPIRATORY_TRACT | 3 refills | Status: DC | PRN

## 2020-01-12 MED ORDER — CYCLOBENZAPRINE HCL 10 MG PO TABS: ORAL_TABLET | ORAL | 3 refills | Status: DC

## 2020-01-12 MED ORDER — ALPRAZOLAM 1 MG PO TABS: 1.0000 mg | ORAL_TABLET | Freq: Two times a day (BID) | ORAL | 3 refills | Status: DC | PRN

## 2020-01-12 MED ORDER — XIIDRA 5 % OP SOLN: 1.0000 [drp] | Freq: Every day | OPHTHALMIC | 3 refills | Status: DC | PRN

## 2020-01-12 MED ORDER — DEXILANT 60 MG PO CPDR: 60.0000 mg | DELAYED_RELEASE_CAPSULE | Freq: Every day | ORAL | 3 refills | Status: DC

## 2020-01-12 MED ORDER — LATANOPROST 0.005 % OP SOLN: 1.0000 [drp] | Freq: Every day | OPHTHALMIC | 3 refills | Status: DC

## 2020-01-12 MED ORDER — ALBUTEROL SULFATE (2.5 MG/3ML) 0.083% IN NEBU: 2.5000 mg | INHALATION_SOLUTION | RESPIRATORY_TRACT | 3 refills | Status: DC | PRN

## 2020-01-12 MED ORDER — ESTRADIOL 0.1 MG/GM VA CREA: 1.0000 | TOPICAL_CREAM | Freq: Every day | VAGINAL | 3 refills | Status: DC | PRN

## 2020-01-12 MED ORDER — SHINGRIX 50 MCG/0.5ML IM SUSR: 0.5000 mL | Freq: Once | INTRAMUSCULAR | 1 refills | Status: AC

## 2020-01-12 MED ORDER — MECLIZINE HCL 12.5 MG PO TABS: 12.5000 mg | ORAL_TABLET | Freq: Three times a day (TID) | ORAL | 3 refills | Status: DC | PRN

## 2020-01-12 MED ORDER — METRONIDAZOLE 0.75 % EX CREA: 1.0000 "application " | TOPICAL_CREAM | CUTANEOUS | 3 refills | Status: DC | PRN

## 2020-01-12 MED ORDER — DICYCLOMINE HCL 10 MG PO CAPS: ORAL_CAPSULE | ORAL | 1 refills | Status: DC

## 2020-01-12 MED ORDER — HYDROCHLOROTHIAZIDE 12.5 MG PO TABS: 12.5000 mg | ORAL_TABLET | Freq: Every day | ORAL | 3 refills | Status: DC

## 2020-01-12 MED ORDER — NEOMYCIN-POLYMYXIN-DEXAMETH 3.5-10000-0.1 OP OINT: 1.0000 "application " | TOPICAL_OINTMENT | Freq: Every evening | OPHTHALMIC | 3 refills | Status: DC | PRN

## 2020-01-12 MED ORDER — FENOFIBRATE 145 MG PO TABS: 145.0000 mg | ORAL_TABLET | Freq: Every day | ORAL | 3 refills | Status: DC

## 2020-01-12 MED ORDER — ALBUTEROL SULFATE HFA 108 (90 BASE) MCG/ACT IN AERS: 2.0000 | INHALATION_SPRAY | RESPIRATORY_TRACT | 3 refills | Status: DC | PRN

## 2020-01-12 MED ORDER — BRIMONIDINE TARTRATE 0.2 % OP SOLN: 1.0000 [drp] | Freq: Two times a day (BID) | OPHTHALMIC | 3 refills | Status: DC

## 2020-01-12 NOTE — Progress Notes (Signed)
Subjective:   Patient presents for Medicare Annual/Subsequent preventive examination.   Patient here for a wellness visit.  Chronic medications reviewed.  She will be moving to [place] to be with her son the 1st week of May  Chronic rectal pain for sure she is seen by colorectal surgeon.  She will status with one in [place].  She is also had longstanding treatment with gastroenterology for this along with bowel changes/IBS. She does get some rectal incontinence  She has been maintained on Bentyl as needed   Hyperlipidemia with Karlene Lineman for repeat lipid panel along with liver function testing.  She is on fenofibrate 145 mg daily.  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  Irbesartan 75 mg daily   Asthma she is on Symbicort twice daily she also has as needed albuterol and albuterol neb.  She has not had any recent exacerbations.  Chronic insomnia Ambien 10 mg as needed,  Rosacea as she uses metronidazole cream as needed to the face  Anxiety/depression she has low-dose alprazolam as needed.  States she gets very emotional   quarentine has been very difficult for her being byself and no  she has also had to spend a large quantity of money on her home over the past 6 months  She has lost friends due to the political climate  She had a fall a month or so ago coming down the stairs, no injury, felt like left knee gave out on her She doesn't like what she cooks, and will throw food out But also states she has been more forgetful  She admits to boredom on top of every thing else   She chronic pain in shpoulder hand, knees, asked for refill for hydrocodone   Glaucoma - severe glacuoma followed by eye doctor,  Legally blind in left eye  has difficulty seeing ophthalmology    Request paperwork be filled out for [facility] to get  assistance with transportation and help at home        Review Past Medical/Family/Social: per EMR   Risk Factors  Current exercise habits: walks some Dietary issues discussed: Yes  Cardiac risk factors: Obesity (BMI >= 30 kg/m2). HTN, hyperlipidemia, [place]   Depression Screen  (Note: if answer to either of the following is "Yes", a more complete depression screening is indicated)  Over the past two weeks, have you felt down, depressed or hopeless? Yes Over the past two weeks, have you felt little interest or pleasure in doing things? Yes Have you lost interest or pleasure in daily life? Yes Do you often feel hopeless? Yes Do you cry easily over simple problems? No   Activities of Daily Living  In your present state of health, do you have any difficulty performing the following activities?:  Driving? Yes  Managing money? No  Feeding yourself? No  Getting from bed to chair? No  Climbing a flight of stairs? yes  Preparing food and eating?: No  Bathing or showering? No  Getting dressed: No  Getting to the toilet? No  Using the toilet:No  Moving around from place to place: yes  In the past year have you fallen or had a near fall?:yes  Are you sexually active? No  Do you have more than one partner? No   Hearing Difficulties:  Do you often ask people to speak up or  repeat themselves? No  Do you experience ringing or noises in your ears? Yes Do you have difficulty understanding soft or whispered voices? No  Do you feel that you have a problem with memory? No Do you often misplace items? No  Do you feel safe at home? Yes  Cognitive Testing  Alert? Yes Normal Appearance?Yes  Oriented to person? Yes Place? Yes  Time? Yes  Recall of three objects? Yes  Can perform simple calculations? Yes  Displays appropriate judgment?Yes  Can read the correct time from a watch face?Yes   List the Names of Other Physician/Practitioners you currently use:   Ophthalmology   GI   Colorectal surgery     Screening Tests / Date Colonoscopy   UTD                   Zostavax  UTD  COVID-19 Vaccine UTD  Mammogram UTD Influenza Vaccine  UTD Pneumonia- Due for pneumonia 23  Tetanus/tdap UTD BONE dENSITY utd Normal  2019   ROS: GEN- denies fatigue, fever, weight loss,weakness, recent illness HEENT- denies eye drainage, change in vision, nasal discharge, CVS- denies chest pain, palpitations RESP- denies SOB, cough, wheeze ABD- denies N/V, change in stools, abd pain GU- denies dysuria, hematuria, dribbling, incontinence MSK- + joint pain, muscle aches, injury Neuro- denies headache, dizziness, syncope, seizure activity  Physical: vitals reviewed  GEN- NAD, alert and oriented x3 HEENT- PERRL, EOMI, non injected sclera, pink conjunctiva, MMM, oropharynx clear Neck- Supple, no thryomegaly CVS- RRR, no murmur RESP-CTAB Psych- normal affect and mood  Neuro-CNII-XII in tact, no  MSK- Decreased ROM Spine, hips, knees , TTP lumbar spine  Fair ROM upper ext L > R  Psych- depressed affect, not anxious, no SI well groomed  EXT- No edema Pulses- Radial, DP- 2+    Assessment:    Annual wellness medicare exam   Plan:    During the course of the visit the patient was educated and counseled about appropriate screening and preventive services including:   Immunizations: PNA 23 given   Depression/anxiety- restart lexapro 89m at bedtime did well with this in the past   CAGE screen neg / PHQ score was 0 - pt did not express to nurse   continue ambien for insomnia  f/u 3 weeks for meds before she leaves for [place]  Chronic pain multiple joints/OA/DDD mild spinal stenosis- refilled norco #10 tabs uses sparingly, as well as naprosyn  Glaucoma followed ophthalmology needs assistance with transportation to appt unable to drive, also needs assistance with some ADLS such as cleaning  Legally blind in left eye with poor vision in right eye   Asthma controlled  no changes   HTN- controlled no changes  CKD- check renal function   NASH/hyperlipidemia- followed by GI   [facility] Diet review for nutrition referral? Yes ____ Not Indicated __x__  Patient Instructions (the written plan) was given to the patient.  Medicare Attestation  I have personally reviewed:  The patient's medical and social history  Their use of alcohol, tobacco or illicit drugs  Their current medications and supplements  The patient's functional ability including ADLs,fall risks, home safety risks, cognitive, and hearing and visual impairment  Diet and physical activities  Evidence for depression or mood disorders  The patient's weight, height, BMI, and visual acuity have been recorded in the chart. I have made referrals, counseling, and provided education to the patient based on review of the above and  I have provided the patient with a written personalized care plan for preventive services.

## 2020-01-12 NOTE — Patient Instructions (Addendum)
Restart lexapro 22m at bedtime for mood I will complete your form  F/U 3 weeks Telehealth-VIDEO VISIT  for medication

## 2020-01-15 ENCOUNTER — Other Ambulatory Visit: Payer: Self-pay | Admitting: *Deleted

## 2020-01-15 DIAGNOSIS — R7989 Other specified abnormal findings of blood chemistry: Secondary | ICD-10-CM

## 2020-01-16 LAB — CBC WITH DIFFERENTIAL/PLATELET
Absolute Monocytes: 624 cells/uL (ref 200–950)
Basophils Absolute: 72 cells/uL (ref 0–200)
Basophils Relative: 0.9 %
Eosinophils Absolute: 440 cells/uL (ref 15–500)
Eosinophils Relative: 5.5 %
HCT: 38.5 % (ref 35.0–45.0)
Hemoglobin: 13.1 g/dL (ref 11.7–15.5)
Lymphs Abs: 2568 cells/uL (ref 850–3900)
MCH: 32.9 pg (ref 27.0–33.0)
MCHC: 34 g/dL (ref 32.0–36.0)
MCV: 96.7 fL (ref 80.0–100.0)
MPV: 10.5 fL (ref 7.5–12.5)
Monocytes Relative: 7.8 %
Neutro Abs: 4296 cells/uL (ref 1500–7800)
Neutrophils Relative %: 53.7 %
Platelets: 232 10*3/uL (ref 140–400)
RBC: 3.98 10*6/uL (ref 3.80–5.10)
RDW: 12.3 % (ref 11.0–15.0)
Total Lymphocyte: 32.1 %
WBC: 8 10*3/uL (ref 3.8–10.8)

## 2020-01-16 LAB — COMPREHENSIVE METABOLIC PANEL
AG Ratio: 1.8 (calc) (ref 1.0–2.5)
ALT: 32 U/L — ABNORMAL HIGH (ref 6–29)
AST: 45 U/L — ABNORMAL HIGH (ref 10–35)
Albumin: 4.6 g/dL (ref 3.6–5.1)
Alkaline phosphatase (APISO): 45 U/L (ref 37–153)
BUN/Creatinine Ratio: 13 (calc) (ref 6–22)
BUN: 17 mg/dL (ref 7–25)
CO2: 25 mmol/L (ref 20–32)
Calcium: 10.6 mg/dL — ABNORMAL HIGH (ref 8.6–10.4)
Chloride: 96 mmol/L — ABNORMAL LOW (ref 98–110)
Creat: 1.32 mg/dL — ABNORMAL HIGH (ref 0.50–0.99)
Globulin: 2.5 g/dL (calc) (ref 1.9–3.7)
Glucose, Bld: 120 mg/dL — ABNORMAL HIGH (ref 65–99)
Potassium: 3.9 mmol/L (ref 3.5–5.3)
Sodium: 134 mmol/L — ABNORMAL LOW (ref 135–146)
Total Bilirubin: 1 mg/dL (ref 0.2–1.2)
Total Protein: 7.1 g/dL (ref 6.1–8.1)

## 2020-01-16 LAB — LIPID PANEL
Cholesterol: 205 mg/dL — ABNORMAL HIGH (ref <200)
HDL: 26 mg/dL — ABNORMAL LOW (ref >=50)
LDL Cholesterol (Calc): 134 mg/dL (calc) — ABNORMAL HIGH
Non-HDL Cholesterol (Calc): 179 mg/dL (calc) — ABNORMAL HIGH (ref <130)
Total CHOL/HDL Ratio: 7.9 (calc) — ABNORMAL HIGH (ref <5.0)
Triglycerides: 290 mg/dL — ABNORMAL HIGH (ref <150)

## 2020-01-16 LAB — TEST AUTHORIZATION

## 2020-01-16 LAB — TSH: TSH: 5.12 mIU/L — ABNORMAL HIGH (ref 0.40–4.50)

## 2020-01-16 LAB — T4, FREE: Free T4: 1.3 ng/dL (ref 0.8–1.8)

## 2020-01-16 LAB — T3, FREE: T3, Free: 3 pg/mL (ref 2.3–4.2)

## 2020-01-19 ENCOUNTER — Telehealth: Payer: Self-pay | Admitting: *Deleted

## 2020-01-19 NOTE — Telephone Encounter (Signed)
Received call from patient.   Reports that she has had x3 days of lingering HA, dizziness, dry mouth, and nausea. Reports that nausea was so pronounced this morning she did vomit.   Inquired as to if Sx could be side effects of Lexapro. Inquired as to recommendations if MD feels this is not SE.

## 2020-01-19 NOTE — Telephone Encounter (Signed)
She has chronic GI issues The other affects are likely residual from restarting lexapro  She can take 1/2 tablet of the lexapro at bedtime for the next week, then go back to 31m She should be taking this at bedtime

## 2020-01-19 NOTE — Telephone Encounter (Signed)
Call placed to patient and patient made aware.   Agreeable to plan.

## 2020-01-23 ENCOUNTER — Telehealth: Payer: Self-pay | Admitting: Family Medicine

## 2020-01-23 NOTE — Telephone Encounter (Signed)
Spoke with patient and informed her of recommendations by Dr. Buelah Manis. Patient verbalized understanding.

## 2020-01-23 NOTE — Telephone Encounter (Signed)
Patient called in stating that despite taking 1/2 tablet on her Lexapro she is still having issues with nausea an decreased appetite . She would like to know what else you recommend. Please advise?

## 2020-01-23 NOTE — Telephone Encounter (Signed)
Stop the lexapro for now  Call us in 1 week, to make sure symptoms completely resolved and allow this medication to wash out of her system   I am considering wellbutrin as next step

## 2020-02-01 NOTE — Telephone Encounter (Signed)
I have documented information in patients guarantor account.

## 2020-02-02 ENCOUNTER — Encounter: Payer: Self-pay | Admitting: Family Medicine

## 2020-02-02 ENCOUNTER — Telehealth (INDEPENDENT_AMBULATORY_CARE_PROVIDER_SITE_OTHER): Payer: Medicare Other | Admitting: Family Medicine

## 2020-02-02 VITALS — BP 110/68

## 2020-02-02 DIAGNOSIS — I959 Hypotension, unspecified: Secondary | ICD-10-CM

## 2020-02-02 DIAGNOSIS — F330 Major depressive disorder, recurrent, mild: Secondary | ICD-10-CM | POA: Diagnosis not present

## 2020-02-02 DIAGNOSIS — F419 Anxiety disorder, unspecified: Secondary | ICD-10-CM | POA: Diagnosis not present

## 2020-02-02 DIAGNOSIS — R110 Nausea: Secondary | ICD-10-CM

## 2020-02-02 NOTE — Progress Notes (Signed)
Virtual Visit via video note  I connected with [name] on 02/02/20 at 11:53am by video and verified that I am speaking with the correct person using two identifiers.     Pt location: at home   Physician location:  In office, [facility], [name] MD     On call: patient and physician   I discussed the limitations, risks, security and privacy concerns of performing an evaluation and management service by telephone and the availability of in person appointments. I also discussed with the patient that there may be a patient responsible charge related to this service. The patient expressed understanding and agreed to proceed.   History of Present Illness: Telehealth visit in the setting of COVID-19 pandemic.  Patient was started on Lexapro approximately 3 weeks ago secondary to increased anxiety and stressors.  She called in stating that she was having GI upset and feeling fatigue with the medication so I had her stop and then restart at 2.5 mg but states she has the same symptoms.  She is now off of the medication but still taking her alprazolam typically in the evening.  She states that she still has some anxiety and is stressed over things with her home but she finds take care of it by her self as well as other finances but wants to hold off on starting anything new at this time as she will be leaving to go to [place] to stay with her son for couple weeks.  She still has been having some hypotensive episodes.  She is on irbesartan 75 mg once a day and HCTZ.  Her blood pressure today was 110/68 per report Sometimes he just feels weak when her blood pressure is not low.  She is a chronic nausea despite even stopping the Lexapro for about.  She does have chronic GI issues but her appoint with gastroenterologist now until September.  She has not had any vomiting episodes and no diarrhea episodes.  She does have Zofran on hand.  States that she exercises her self to eat  because she just does not feel like it at times.  She does feel like things with her family she will eat much better because she has others that are around her.   Observations/Objective: No Acute distress noted via video , well appearing  RESP- normal work of breathing.   Psych- Normal speech.  Psych normal affect and mood  Assessment and Plan: Major depressive disorder with generalized anxiety.  She did not tolerate Lexapro.  She is leaving to go to [place] within the next week would not start her on any medication at this time.  She will continue her alprazolam.  We will reconnect when she gets back into town.  Mild hypotensive episodes we will discontinue the irbesartan for now.  She will monitor her blood pressure on just the HCTZ  Mildly abnormal TSH this will be repeated when she comes back into town in 3 weeks.  Chronic nausea she denies any acute abdominal pain she has Zofran on hand.  She is going to schedule with her gastroenterologist when she gets back into town  Follow Up Instructions:    I discussed the assessment and treatment plan with the patient. The patient was provided an opportunity to ask questions and all were answered. The patient agreed with the plan and demonstrated an understanding of the instructions.   The patient was advised to call back or seek an in-person evaluation if the symptoms  worsen or if the condition fails to improve as anticipated.  I provided 18 minutes of non-face-to-face time during this encounter. End Time  12:11pm   [name], MD

## 2020-03-08 ENCOUNTER — Telehealth: Payer: Self-pay | Admitting: Family Medicine

## 2020-03-08 ENCOUNTER — Other Ambulatory Visit: Payer: Self-pay | Admitting: *Deleted

## 2020-03-08 DIAGNOSIS — N182 Chronic kidney disease, stage 2 (mild): Secondary | ICD-10-CM

## 2020-03-08 DIAGNOSIS — R7989 Other specified abnormal findings of blood chemistry: Secondary | ICD-10-CM

## 2020-03-08 DIAGNOSIS — E039 Hypothyroidism, unspecified: Secondary | ICD-10-CM

## 2020-03-08 NOTE — Telephone Encounter (Signed)
CB# [phone]

## 2020-03-08 NOTE — Telephone Encounter (Signed)
Patient requesting orders to be placed for [facility].   Orders placed.

## 2020-03-15 DIAGNOSIS — H401132 Primary open-angle glaucoma, bilateral, moderate stage: Secondary | ICD-10-CM | POA: Diagnosis not present

## 2020-03-18 ENCOUNTER — Telehealth: Payer: Self-pay | Admitting: Family Medicine

## 2020-03-18 NOTE — Telephone Encounter (Signed)
Patient states she went to [facility] on Friday they state that they don't have orders. Please resend orders to [facility].  CB# [phone]

## 2020-03-18 NOTE — Telephone Encounter (Signed)
[facility] call pt  has labs haven't received fax can the nurse place the order in .

## 2020-03-18 NOTE — Telephone Encounter (Signed)
Lab order are in the to be faxed box. Please verify that they have been faxed.

## 2020-03-18 NOTE — Telephone Encounter (Signed)
Faxed lab orders to requested number.

## 2020-03-18 NOTE — Telephone Encounter (Signed)
Per patient fax # is [phone] for [facility].  CB# [phone]

## 2020-03-21 ENCOUNTER — Encounter: Payer: Self-pay | Admitting: Family Medicine

## 2020-03-21 DIAGNOSIS — N182 Chronic kidney disease, stage 2 (mild): Secondary | ICD-10-CM | POA: Diagnosis not present

## 2020-03-21 DIAGNOSIS — E039 Hypothyroidism, unspecified: Secondary | ICD-10-CM | POA: Diagnosis not present

## 2020-03-21 DIAGNOSIS — R7989 Other specified abnormal findings of blood chemistry: Secondary | ICD-10-CM | POA: Diagnosis not present

## 2020-03-26 ENCOUNTER — Other Ambulatory Visit: Payer: Self-pay | Admitting: Family Medicine

## 2020-03-29 ENCOUNTER — Telehealth: Payer: Self-pay | Admitting: Family Medicine

## 2020-03-29 DIAGNOSIS — E039 Hypothyroidism, unspecified: Secondary | ICD-10-CM

## 2020-03-29 NOTE — Telephone Encounter (Signed)
Call pt, I received TSH but not her metabolic panel   Her calcium was being rechecked   Her thyroid level is still abnormal but the level has improved   I would recommend referral to endocrinology to help determine if true thyroid disorder  Dx Subclinical hypothyroidism , hypercalcemia

## 2020-03-29 NOTE — Telephone Encounter (Signed)
Call placed to patient and patient made aware. Agreeable to endocrinology referral. Referral orders placed.   Patient states that she was also reviewing her chart and noted that Sickle cell trait and G6PD were not listed under her problem list. Ok to add?  Also inquired as to when her F/U appointment should be. Please advise.

## 2020-03-29 NOTE — Telephone Encounter (Signed)
Her blood traits /G6PD are already listed in her medical history  Sickle cell  trait does not need to be on active list as there is no treatment and I dont want this confused with sickle cell anemia You can put the G6PD if she wants that to be active  [facility] can f/u  in 4 months - 30 minute slot

## 2020-03-29 NOTE — Addendum Note (Signed)
Addended by: Sheral Flow on: 03/29/2020 02:29 PM   Modules accepted: Orders

## 2020-04-02 ENCOUNTER — Encounter: Payer: Self-pay | Admitting: *Deleted

## 2020-04-02 DIAGNOSIS — Z1589 Genetic susceptibility to other disease: Secondary | ICD-10-CM | POA: Insufficient documentation

## 2020-04-02 NOTE — Telephone Encounter (Signed)
Problem list updated.   Call placed to patient and patient made aware.   Reports that she is having increased pain in her arm and upper back and feels that it may be pinched muscle. Advised to schedule appointment with PCP.   Appointment scheduled.

## 2020-04-05 ENCOUNTER — Other Ambulatory Visit: Payer: Self-pay

## 2020-04-05 ENCOUNTER — Ambulatory Visit: Payer: Self-pay | Admitting: Family Medicine

## 2020-04-05 ENCOUNTER — Ambulatory Visit (HOSPITAL_COMMUNITY)
Admission: RE | Admit: 2020-04-05 | Discharge: 2020-04-05 | Disposition: A | Discharge status: Home / Self Care | Payer: Medicare Other | Source: Ambulatory Visit | Attending: Family Medicine | Admitting: Family Medicine

## 2020-04-05 ENCOUNTER — Encounter: Payer: Self-pay | Admitting: Family Medicine

## 2020-04-05 ENCOUNTER — Ambulatory Visit (INDEPENDENT_AMBULATORY_CARE_PROVIDER_SITE_OTHER): Payer: Medicare Other | Admitting: Family Medicine

## 2020-04-05 VITALS — BP 120/72 | HR 87 | Temp 97.0°F | Ht 67.0 in | Wt 212.0 lb

## 2020-04-05 DIAGNOSIS — E039 Hypothyroidism, unspecified: Secondary | ICD-10-CM

## 2020-04-05 DIAGNOSIS — E038 Other specified hypothyroidism: Secondary | ICD-10-CM

## 2020-04-05 DIAGNOSIS — G5622 Lesion of ulnar nerve, left upper limb: Secondary | ICD-10-CM | POA: Insufficient documentation

## 2020-04-05 DIAGNOSIS — M19022 Primary osteoarthritis, left elbow: Secondary | ICD-10-CM | POA: Diagnosis not present

## 2020-04-05 DIAGNOSIS — M25522 Pain in left elbow: Secondary | ICD-10-CM | POA: Diagnosis not present

## 2020-04-05 DIAGNOSIS — G8929 Other chronic pain: Secondary | ICD-10-CM | POA: Diagnosis not present

## 2020-04-05 NOTE — Patient Instructions (Addendum)
Take ibupofren 639m once a day for 1 week Ice the elbow   Get the xray done  Nerve conduction to be done   F/U 4 months ( 30 minute slot)

## 2020-04-05 NOTE — Progress Notes (Signed)
   Subjective:    Patient ID: [name], female    DOB: 1950/12/18, 69 y.o.   MRN: [id number]  Patient presents for Arm Pain  Pt here with chronic left elbow and arm pain. 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 She has noticed some swelling and states that side feels different from the right   She gets a constant ache in elbow that may go into forearm and then her 4th and 5th digits, other times goes up the inner upper arm and into chest wall in the muscles. She has used [employer] cream/biofreeze    She takes norco when severe pain        Subclinical Hypothyroidism/ hypercalcemia - she has been referred to endocrinology      Review Of Systems:  GEN- denies fatigue, fever, weight loss,weakness, recent illness HEENT- denies eye drainage, change in vision, nasal discharge, CVS- denies chest pain, palpitations RESP- denies SOB, cough, wheeze ABD- denies N/V, change in stools, abd pain GU- denies dysuria, hematuria, dribbling, incontinence MSK- + joint pain, muscle aches, injury Neuro- denies headache, dizziness, syncope, seizure activity       Objective:    BP 120/72   Pulse 87   Temp (!) 97 F (36.1 C) (Temporal)   Ht 5' (1.524 m)   Wt 212 lb (96.2 kg)   SpO2 97%   BMI 41.40 kg/m  GEN- NAD, alert and oriented x3 Neck- Supple, fair ROM , neg spurlings  CVS- RRR, no murmur RESP-CTAB MSK- TTP medial epicondyle and olecranon, no effusion palpated, no warmth, pain with extension of elbow, and rotation, decreased ROM left shoulder  Decreased grasp left hand compared to right, has mild contractures of 4th and 5th digit ( chronic- old injury).  NEURO- sensation grossly in tact, UE  EXT- No edema Pulses- Radial 2+        Assessment & Plan:      Problem List Items Addressed This Visit    None    Visit  Diagnoses    Subclinical hypothyroidism    -  Primary   She has beenreferred to endocrinology for further evaluation and treatment    Left elbow pain       Possible bursitis, epicodylitis, but also with some ulnar neuropathy symptoms. Due to renal function GI, ibuprofen 619m daily x 1 week, then continue norco and topicals  ICE. Pending results will see if ortho vs hand surgeon needed  Does not tolerate steroids  NSAIDS limited due to CKD    Relevant Orders   DG Elbow Complete Left   Nerve conduction test   Ulnar neuropathy of left upper extremity       Relevant Orders   DG Elbow Complete Left   Nerve conduction test      Note: This dictation was prepared with Dragon dictation along with smaller phrase technology. Any transcriptional errors that result from this process are unintentional.

## 2020-04-10 ENCOUNTER — Telehealth: Payer: Self-pay | Admitting: *Deleted

## 2020-04-10 DIAGNOSIS — M25522 Pain in left elbow: Secondary | ICD-10-CM

## 2020-04-10 DIAGNOSIS — G5622 Lesion of ulnar nerve, left upper limb: Secondary | ICD-10-CM

## 2020-04-10 NOTE — Telephone Encounter (Signed)
-----   Message from Roney Marion sent at 04/10/2020  9:01 AM EDT ----- Regarding: neurology referral Please put in a referral for Neurology for nerve conduction test. Looks like order was placed on 04/05/20.  Thank you, Larene Beach

## 2020-05-10 ENCOUNTER — Other Ambulatory Visit: Payer: Self-pay

## 2020-05-10 ENCOUNTER — Encounter: Payer: Self-pay | Admitting: "Endocrinology

## 2020-05-10 ENCOUNTER — Ambulatory Visit (INDEPENDENT_AMBULATORY_CARE_PROVIDER_SITE_OTHER): Payer: Medicare Other | Admitting: "Endocrinology

## 2020-05-10 DIAGNOSIS — Z6833 Body mass index (BMI) 33.0-33.9, adult: Secondary | ICD-10-CM | POA: Diagnosis not present

## 2020-05-10 DIAGNOSIS — E039 Hypothyroidism, unspecified: Secondary | ICD-10-CM | POA: Diagnosis not present

## 2020-05-10 DIAGNOSIS — E6609 Other obesity due to excess calories: Secondary | ICD-10-CM

## 2020-05-10 MED ORDER — LEVOTHYROXINE SODIUM 25 MCG PO TABS: 25.0000 ug | ORAL_TABLET | Freq: Every day | ORAL | 0 refills | Status: DC

## 2020-05-10 NOTE — Progress Notes (Signed)
Endocrinology Consult Note                                            05/10/2020, 1:54 PM   Subjective:    Patient ID: [name], female    DOB: 02-Jun-1951, PCP Joanne Rossetti, MD   Past Medical History:  Diagnosis Date  . Anxiety   . Arthritis   . Asthma   . Chest pain    a. 02/2000 Cath: nl cors, EF 60%;  b. 10/2010 MV: nl LV, no ischemia/infarct;  c. 03/2014 Admit c/p, r/o->grief from husbands death.  . Chronic RUQ pain 12-07-2007   EUS slightly dilated CBD (7.75m), otherwise nl  . Depression   . Eczema   . Exposure to chemical inhalation mid 1970s  . Fatty liver   . G6PD deficiency   . GERD (gastroesophageal reflux disease)   . Glaucoma   . History of pulmonary embolus (PE)    Treated with Eliquis 22016-03-03 . HTN (hypertension)   . Hypercholesteremia    a. intolerant to statins.  . IBS (irritable bowel syndrome)   . Kidney stone   . 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 Palpitations    a. 05/2008 Echo: nl LV fxn.  .Marland KitchenPONV (postoperative nausea and vomiting)   . Recurrent upper respiratory infection (URI)   . Renal cyst   . Sickle cell trait ([place]   . Tubular adenoma of colon 09/17/2011   12/2010 Next colonoscopy 12/2015   . Urticaria    Past Surgical History:  Procedure Laterality Date  . ABDOMINAL HYSTERECTOMY    . ADENOIDECTOMY    . ANGIOPLASTY    . APPENDECTOMY    . BIOPSY N/A 03/14/2013   Procedure: SMALL BOWEL AND GASTRIC BIOPSIES (Procedure #1);  Surgeon: SDanie Binder MD;  Location: [facility];  Service: Endoscopy;  Laterality: N/A;  . CARDIAC CATHETERIZATION Left 02/11/2000  . CATARACT EXTRACTION Left   . CHOLECYSTECTOMY  12/2007  . COLONOSCOPY  12/22/10   RXBD:ZHGDJM cecal adenomatous polyp  . COLONOSCOPY WITH PROPOFOL N/A 03/31/2016   Dr. FOneida Alar four sessile polyps rectum/sigmoid colon, diverticulosi, ext/int hemorrhoids. hyperplastic polyps, next tcs 5 years.  . complete hysterectomy    . ENTEROSCOPY N/A 03/14/2013   SEQA:STMH gastritis/ulcers has healed  . ESOPHAGOGASTRODUODENOSCOPY  09/22/2011   SDQQ:IWLNgastritis/Duodenitis  . FLEXIBLE SIGMOIDOSCOPY N/A 03/14/2013   SLF:3 colon polyp removed/moderate sized internal hemorrhoids  . FLEXIBLE SIGMOIDOSCOPY N/A 06/09/2016   Procedure: FLEXIBLE SIGMOIDOSCOPY;  Surgeon: SDanie Binder MD;  Location: [facility];  Service: Endoscopy;  Laterality: N/A;  rectal polyps times 2  . FLEXIBLE SIGMOIDOSCOPY N/A 03/18/2018   Procedure: FLEXIBLE SIGMOIDOSCOPY;  Surgeon: FDanie Binder MD;  Location: [facility];  Service: Endoscopy;  Laterality: N/A;  9:15am  . FOOT SURGERY     bunion removal left  . GIVENS CAPSULE STUDY N/A 02/10/2013   Procedure: GIVENS CAPSULE STUDY;  Surgeon: SDanie Binder MD;  [facility]: [facility];  Service: Endoscopy;  Laterality: N/A;  730  . HEEL SPUR EXCISION     right   . HEMORRHOID BANDING N/A 03/14/2013   Procedure: HEMORRHOID BANDING (Procedure #3)  3 bands applied [id number]#[id number] 02/02/2014 ;  Surgeon: SDanie Binder MD;  Location: [facility];  Service: Endoscopy;  Laterality:  N/A;  . HEMORRHOID SURGERY N/A 04/24/2016   Procedure: EXTENSIVE HEMORRHOIDECTOMY;  Surgeon: Vickie Epley, MD;  Location: [facility];  Service: General;  Laterality: N/A;  . LAPAROSCOPY     adhesions  . POLYPECTOMY N/A 03/14/2013   AJO:INOM Gastritis . ULCERS SEEN ON MAY 6 HAVE HEALED  . POLYPECTOMY  03/31/2016   Procedure: POLYPECTOMY;  Surgeon: Danie Binder, MD;  Location: [facility];  Service: Endoscopy;;  sigmoid colon polyps x2, rectal polyps x2  . TONSILLECTOMY    . TUBAL LIGATION     Social History   Socioeconomic History  . Marital status: Widowed    Spouse name: Not on file  . Number of children: Not on file  . Years of education: Not on file  . Highest education level: Not on file  Occupational History  . Occupation: homemaker    Employer: UNEMPLOYED  Tobacco Use  . Smoking status: Former Smoker    Packs/day: 0.50    Years: 30.00    Pack  years: 15.00    Types: Cigarettes  . Smokeless tobacco: Former Systems developer    Quit date: 10/15/1996  . Tobacco comment: Stopped smoking ~ 2000  Vaping Use  . Vaping Use: Never used  Substance and Sexual Activity  . Alcohol use: Yes    Comment: occassion  . Drug use: No  . Sexual activity: Yes    Birth control/protection: Surgical  Other Topics Concern  . Not on file  Social History Narrative   Does not routinely exercise. Husband passed in JUN 2015 due to prostate ca.   Social Determinants of Health   Financial Resource Strain:   . Difficulty of Paying Living Expenses:   Food Insecurity:   . Worried About Charity fundraiser in the Last Year:   . Arboriculturist in the Last Year:   Transportation Needs:   . Film/video editor (Medical):   Marland Kitchen Lack of Transportation (Non-Medical):   Physical Activity:   . Days of Exercise per Week:   . Minutes of Exercise per Session:   Stress:   . Feeling of Stress :   Social Connections:   . Frequency of Communication with Friends and Family:   . Frequency of Social Gatherings with Friends and Family:   . Attends Religious Services:   . Active Member of Clubs or Organizations:   . Attends Archivist Meetings:   Marland Kitchen Marital Status:    Family History  Problem Relation Age of Onset  . Colon cancer Mother        45s  . Urticaria Mother   . Hypertension Mother   . Cancer Mother   . Cancer Father        oral  . Crohn's disease Sister   . Colon cancer Maternal Grandfather   . Colon cancer Paternal Grandfather   . Diabetes Brother   . Colon cancer Maternal Grandmother   . Anesthesia problems Neg Hx    Outpatient Encounter Medications as of 05/10/2020  Medication Sig  . cyanocobalamin 1000 MCG tablet Take 1,000 mcg by mouth daily.  Marland Kitchen albuterol (PROVENTIL) (2.5 MG/3ML) 0.083% nebulizer solution Take 3 mLs (2.5 mg total) by nebulization every 4 (four) hours as needed for wheezing or shortness of breath.  Marland Kitchen albuterol (VENTOLIN HFA)  108 (90 Base) MCG/ACT inhaler Inhale 2 puffs into the lungs every 4 (four) hours as needed for wheezing.  Marland Kitchen ALPRAZolam (XANAX) 1 MG tablet Take 1 tablet (1 mg total) by  mouth 2 (two) times daily as needed for anxiety.  . brimonidine (ALPHAGAN) 0.2 % ophthalmic solution Place 1 drop into both eyes 2 (two) times daily.  . budesonide-formoterol (SYMBICORT) 160-4.5 MCG/ACT inhaler Inhale 2 puffs into the lungs as needed.  . cyclobenzaprine (FLEXERIL) 10 MG tablet TAKE ONE TABLET BY MOUTH DAILY  as needed muscle spasms  . dexlansoprazole (DEXILANT) 60 MG capsule Take 1 capsule (60 mg total) by mouth daily.  Marland Kitchen dicyclomine (BENTYL) 10 MG capsule Take one capsule before meals and at bedtime AS NEEDED for abdominal pain and frequent stool. HOLD for constipation  . Ergocalciferol (VITAMIN D2) 50 MCG (2000 UT) TABS Take 2,000 Units by mouth daily.  Marland Kitchen estradiol (ESTRACE VAGINAL) 0.1 MG/GM vaginal cream Place 1 Applicatorful vaginally daily as needed.  . fenofibrate (TRICOR) 145 MG tablet Take 1 tablet (145 mg total) by mouth daily.  . hydrochlorothiazide (HYDRODIURIL) 12.5 MG tablet Take 1 tablet (12.5 mg total) by mouth daily.  Marland Kitchen HYDROcodone-acetaminophen (NORCO/VICODIN) 5-325 MG tablet Take 1 tablet by mouth every 6 (six) hours as needed for moderate pain.  . hyoscyamine (ANASPAZ) 0.125 MG TBDP disintergrating tablet Place 0.125 mg under the tongue.  . irbesartan (AVAPRO) 150 MG tablet Take 0.5 tablets (75 mg total) by mouth daily.  Marland Kitchen latanoprost (XALATAN) 0.005 % ophthalmic solution Place 1 drop into both eyes at bedtime.  Marland Kitchen levothyroxine (SYNTHROID) 25 MCG tablet Take 1 tablet (25 mcg total) by mouth daily before breakfast.  . Lidocaine-Hydrocortisone Ace 3-2.5 % KIT APPLY TO RECTUM QID for 2 weeks then AS NEEDED FOR RECTAL PAIN OR BLEEDING  . magnesium oxide (HM MAGNESIUM) 400 MG tablet Take 2 tablets (800 mg total) by mouth daily. (Patient taking differently: Take 400 mg by mouth daily. Takes 1 tablet  daily.)  . meclizine (ANTIVERT) 12.5 MG tablet Take 1 tablet (12.5 mg total) by mouth 3 (three) times daily as needed for dizziness.  . Menthol, Topical Analgesic, (BIOFREEZE EX) Apply topically daily as needed (pain).   . metroNIDAZOLE (METROCREAM) 0.75 % cream Apply 1 application topically as needed.  . neomycin-polymyxin b-dexamethasone (MAXITROL) 3.5-10000-0.1 OINT Place 1 application into both eyes at bedtime as needed.  . nitroGLYCERIN (NITROLINGUAL) 0.4 MG/SPRAY spray Place 1 spray under the tongue every 5 (five) minutes as needed for chest pain.  . Nitroglycerin 0.4 % OINT Place 1 drop rectally 3 (three) times daily.  . NON FORMULARY Take 1 tablet by mouth daily as needed (bloat). FD GUARD  . ondansetron (ZOFRAN) 4 MG tablet Take 1 tablet (4 mg total) by mouth every 8 (eight) hours as needed for nausea or vomiting. (Patient taking differently: Take 4 mg by mouth as needed for nausea or vomiting. )  . Probiotic Product (PROBIOTIC DAILY PO) Take 1 capsule by mouth daily.  Marland Kitchen senna (SENOKOT) 8.6 MG TABS tablet Take 1 tablet by mouth daily as needed.   Marland Kitchen XIIDRA 5 % SOLN INSTILL 1 DROP IN BOTH EYES EVERY DAY AS NEEDED (EACH CONTAINER SHOULD YIELD 2 DROPS) STORE IN ORIGINAL FOIL POUCH  . zolpidem (AMBIEN) 10 MG tablet TAKE ONE TABLET BY MOUTH DAILY AT BEDTIME.  . [DISCONTINUED] Multiple Vitamins-Minerals (ALIVE WOMENS 50+ PO) Take 1 tablet by mouth daily.   Facility-Administered Encounter Medications as of 05/10/2020  Medication  . promethazine (PHENERGAN) injection 25 mg   ALLERGIES: Allergies  Allergen Reactions  . Adhesive [Tape] Other (See Comments)    Blisters skin  . Aspirin Other (See Comments)    sickle cell trait--  Not recommended unless emergency  . Keflex [Cephalexin] Hives  . Levaquin [Levofloxacin In D5w]     Altered mental status   . Other     Nut Oil- skin irritation   . Statins Other (See Comments)    Myopathy - elevated CK  . Sulfonamide Derivatives Other (See  Comments)    Patient has sickle cell trait  . Latex Rash    VACCINATION STATUS: Immunization History  Administered Date(s) Administered  . Fluad Quad(high Dose 65+) 06/05/2019  . Influenza Split 07/12/2012  . Influenza,inj,Quad PF,6+ Mos 08/03/2014, 07/19/2015, 08/11/2016, 06/24/2017, 07/04/2018  . Moderna SARS-COVID-2 Vaccination 11/16/2019, 12/15/2019  . Pneumococcal Conjugate-13 02/15/2018  . Pneumococcal Polysaccharide-23 08/03/2014, 01/12/2020  . Tdap 08/25/2012, 08/03/2014  . Zoster 08/25/2012  . Zoster Recombinat (Shingrix) 01/16/2020, 03/21/2020    HPI [name] is 69 y.o. female who presents today with a medical history as above. she is being seen in consultation for hypercalcemia, hypothyroidism requested by Joanne Rossetti, MD.  History was directly obtained from the patient. She has multiple complaints including progressive weight gain, not feeling well. On 2 occasions in the past she was found to have above target TSH levels and low normal T4 levels. 1 out of 5 CMP measurements also show mild hyperglycemia. She denies any prior history of parathyroid/pituitary/adrenal dysfunction. -She does have a family history of some thyroid dysfunction in her cousins. She is now taking extra calcium supplements. She is on hydrochlorothiazide for hypertension. She is on magnesium supplements, no recent magnesium measurements. -Her major concern seems to be her body weight, although recently losing slowly. She made some changes in her lifestyle including avoiding some processed carbs. Denies history of diabetes/prediabetes.  She is on multiple vitamin supplements/medications including inhalers for asthma.  Review of Systems  Constitutional: + Fluctuating body weight,   + fatigue, no subjective hyperthermia, no subjective hypothermia Eyes: no blurry vision, no xerophthalmia ENT: no sore throat, no nodules palpated in throat, no dysphagia/odynophagia, no  hoarseness Cardiovascular: no Chest Pain, no Shortness of Breath, no palpitations, no leg swelling Respiratory: no cough, no shortness of breath Gastrointestinal: no Nausea/Vomiting/Diarhhea Musculoskeletal: no muscle/joint aches Skin: no rashes Neurological: no tremors, no numbness, no tingling, no dizziness Psychiatric: no depression, no anxiety  Objective:    Vitals with BMI 05/10/2020 04/05/2020 02/02/2020  Height 5' 7"  5' 7"  -  Weight 211 lbs 10 oz 212 lbs -  BMI 31.54 00.8 -  Systolic 676 195 093  Diastolic 69 72 68  Pulse 84 87 -    BP 106/69   Pulse 84   Ht 5' 7"  (1.702 m)   Wt 211 lb 9.6 oz (96 kg)   BMI 33.14 kg/m   Wt Readings from Last 3 Encounters:  05/10/20 211 lb 9.6 oz (96 kg)  04/05/20 212 lb (96.2 kg)  01/12/20 217 lb (98.4 kg)    Physical Exam  Constitutional:  Body mass index is 33.14 kg/m.,  not in acute distress, normal state of mind Eyes: PERRLA, EOMI, no exophthalmos ENT: moist mucous membranes, no gross thyromegaly, no gross cervical lymphadenopathy Cardiovascular: normal precordial activity, Regular Rate and Rhythm, no Murmur/Rubs/Gallops Respiratory:  adequate breathing efforts, no gross chest deformity, Clear to auscultation bilaterally Gastrointestinal: abdomen soft, Non -tender, No distension, Bowel Sounds present, no gross organomegaly Musculoskeletal: no gross deformities, strength intact in all four extremities, + bilateral supraclavicular fullness Skin: moist, warm, no rashes Neurological: no tremor with outstretched hands, Deep tendon reflexes normal in bilateral lower extremities.  CMP ( most recent) CMP     Component Value Date/Time   NA 134 (L) 01/12/2020 0922   NA 141 07/12/2012 0755   K 3.9 01/12/2020 0922   K 4.3 07/12/2012 0755   CL 96 (L) 01/12/2020 0922   CL 100 07/12/2012 0755   CO2 25 01/12/2020 0922   GLUCOSE 120 (H) 01/12/2020 0922   BUN 17 01/12/2020 0922   BUN 28 (A) 07/12/2012 0755   CREATININE 1.32 (H) 01/12/2020  0922   CALCIUM 10.6 (H) 01/12/2020 0922   CALCIUM 11.0 07/12/2012 0755   PROT 7.1 01/12/2020 0922   PROT 8.1 07/12/2012 0755   ALBUMIN 4.7 01/04/2017 0822   ALBUMIN 5.3 07/12/2012 0755   AST 45 (H) 01/12/2020 0922   AST 42 07/12/2012 0755   ALT 32 (H) 01/12/2020 0922   ALKPHOS 42 01/04/2017 0822   ALKPHOS 32 07/12/2012 0755   BILITOT 1.0 01/12/2020 0922   BILITOT 1.0 07/12/2012 0755   GFRNONAA 49 (L) 07/04/2018 1442   GFRAA 57 (L) 07/04/2018 1442     Diabetic Labs (most recent): Lab Results  Component Value Date   HGBA1C 5.4 06/14/2019   HGBA1C 5.4 07/04/2018   HGBA1C 5.4 07/29/2015     Lipid Panel ( most recent) Lipid Panel     Component Value Date/Time   CHOL 205 (H) 01/12/2020 0922   CHOL 254 07/12/2012 0755   TRIG 290 (H) 01/12/2020 0922   TRIG 325 07/12/2012 0755   HDL 26 (L) 01/12/2020 0922   CHOLHDL 7.9 (H) 01/12/2020 0922   VLDL 42 (H) 05/07/2017 0918   LDLCALC 134 (H) 01/12/2020 0922      Lab Results  Component Value Date   TSH 5.12 (H) 01/12/2020   TSH 1.66 11/24/2016   TSH 3.900 07/29/2015   TSH 2.917 08/03/2014   TSH 6.160 (H) 03/31/2014   TSH 2.026 02/07/2013   TSH 1.948 09/20/2012   TSH 1.652 10/01/2011   FREET4 1.3 01/12/2020   FREET4 1.19 08/03/2014      Assessment & Plan:   1. Hypercalcemia -Etiology unclear. Still possible for it to be secondary to early mild hyperparathyroidism. She is advised to discontinue hydrochlorothiazide for now. She is also advised to stop magnesium until next measurement of PTH/calcium, magnesium, phosphorus, and vitamin D panel in 2 weeks. -If her labs are indicated of primary hyperparathyroidism, she will undergo 24-hour urine calcium measurement. 2. Hypothyroidism, unspecified type -I reviewed her records showing subclinical hypothyroidism at least on 2 occasions. She would benefit from early initiation of thyroid hormone supplement. She has stated, however accepted low-dose levothyroxine. I discussed and  prescribed 25 mcg of levothyroxine p.o. daily before breakfast.   - We discussed about the correct intake of her thyroid hormone, on empty stomach at fasting, with water, separated by at least 30 minutes from breakfast and other medications,  and separated by more than 4 hours from calcium, iron, multivitamins, acid reflux medications (PPIs). -Patient is made aware of the fact that thyroid hormone replacement is needed for life, dose to be adjusted by periodic monitoring of thyroid function tests.  -Her neck exam is negative for goiter, no need for thyroid imaging for now. Her supraclavicular fullness is due to increased intrathoracic pressure due to asthma/COPD.  Suspicion for Cushing syndrome is low at this time. I had a long discussion about her weight concern and measures to start with. - she  admits there is a room for improvement in her diet and drink choices. -  Suggestion is made for her to avoid simple carbohydrates  from her diet including Cakes, Sweet Desserts / Pastries, Ice Cream, Soda (diet and regular), Sweet Tea, Candies, Chips, Cookies, Sweet Pastries,  Store Bought Juices, Alcohol in Excess of  1-2 drinks a day, Artificial Sweeteners, Coffee Creamer, and "Sugar-free" Products. This will help patient to have stable blood glucose profile and potentially avoid unintended weight gain.   - she is advised to maintain close follow up with Joanne Rossetti, MD for primary care needs.   - Time spent with the patient: 60 minutes, of which >50% was spent in  counseling her about her hypercalcemia, hypothyroidism, obesity and the rest in obtaining information about her symptoms, reviewing her previous labs/studies ( including abstractions from other facilities),  evaluations, and treatments,  and developing a plan to confirm diagnosis and long term treatment based on the latest standards of care/guidelines; and documenting her care.  [name] participated in the discussions, expressed  understanding, and voiced agreement with the above plans.  All questions were answered to her satisfaction. she is encouraged to contact clinic should she have any questions or concerns prior to her return visit.  Follow up plan: Return in about 3 weeks (around 05/31/2020) for F/U with Pre-visit Labs.   Glade Lloyd, MD [facility] [address] [place], [place] [address] Phone: [phone]  Fax: [phone]     05/10/2020, 1:54 PM  This note was partially dictated with voice recognition software. Similar sounding words can be transcribed inadequately or may not  be corrected upon review.

## 2020-05-10 NOTE — Patient Instructions (Addendum)
You stop Magnesium and hydrochlorothiazide  For 2 weeks before your blood work.                                                                              Advice for Weight Management  -For most of [place] the best way to lose weight is by diet management. Generally speaking, diet management means consuming less calories intentionally which over time brings about progressive weight loss.  This can be achieved more effectively by restricting carbohydrate consumption to the minimum possible.  So, it is critically important to know your numbers: how much calorie you are consuming and how much calorie you need. More importantly, our carbohydrates sources should be unprocessed or minimally processed complex starch food items.   Sometimes, it is important to balance nutrition by increasing protein intake (animal or plant source), fruits, and vegetables.  -Sticking to a routine mealtime to eat 3 meals a day and avoiding unnecessary snacks is shown to have a big role in weight control. Under normal circumstances, the only time we lose real weight is when we are hungry, so allow hunger to take place- hunger means no food between meal times, only water.  It is not advisable to starve.   -It is better to avoid simple carbohydrates including: Cakes, Sweet Desserts, Ice Cream, Soda (diet and regular), Sweet Tea, Candies, Chips, Cookies, Store Bought Juices, Alcohol in Excess of  1-2 drinks a day, Artificial Sweeteners, Doughnuts, Coffee Creamers, "Sugar-free" Products, etc, etc.  This is not a complete list...Marland Kitchen.    -Consulting with certified diabetes educators is proven to provide you with the most accurate and current information on diet.  Also, you may be  interested in discussing diet options/exchanges , we can schedule a visit with Jearld Fenton, RDN, CDE for individualized nutrition education.  -Exercise: If you are able: 30 -60 minutes a day ,4 days a week, or 150 minutes a week.  The longer the better.  Combine  stretch, strength, and aerobic activities.  If you were told in the past that you have high risk for cardiovascular diseases, you may seek evaluation by your heart doctor prior to initiating moderate to intense exercise programs.                                  Additional Care Considerations for Diabetes   -Diabetes  is a chronic disease.  The most important care consideration is regular follow-up with your diabetes care provider with the goal being avoiding or delaying its complications and to take advantage of advances in medications and technology.    -Type 2 diabetes is known to coexist with other important comorbidities such as high blood pressure and high cholesterol.  It is critical to control not only the diabetes but also the high blood pressure and high cholesterol to minimize and delay the risk of complications including coronary artery disease, stroke, amputations, blindness, etc.    - Studies showed that people with diabetes will benefit from a class of medications known as ACE inhibitors and statins.  Unless there are specific reasons not to be on these medications, the standard of  care is to consider getting one from these groups of medications at an optimal doses.  These medications are generally considered safe and proven to help protect the heart and the kidneys.    - People with diabetes are encouraged to initiate and maintain regular follow-up with eye doctors, foot doctors, dentists , and if necessary heart and kidney doctors.     - It is highly recommended that people with diabetes quit smoking or stay away from smoking, and get yearly  flu vaccine and pneumonia vaccine at least every 5 years.  One other important lifestyle recommendation is to ensure adequate sleep - at least 6-7 hours of uninterrupted sleep at night.  -Exercise: If you are able: 30 -60 minutes a day, 4 days a week, or 150 minutes a week.  The longer the better.  Combine stretch, strength, and aerobic  activities.  If you were told in the past that you have high risk for cardiovascular diseases, you may seek evaluation by your heart doctor prior to initiating moderate to intense exercise programs.

## 2020-05-15 ENCOUNTER — Other Ambulatory Visit (HOSPITAL_COMMUNITY): Payer: Self-pay | Admitting: Family Medicine

## 2020-05-15 ENCOUNTER — Telehealth: Payer: Self-pay | Admitting: "Endocrinology

## 2020-05-15 DIAGNOSIS — Z1231 Encounter for screening mammogram for malignant neoplasm of breast: Secondary | ICD-10-CM

## 2020-05-15 NOTE — Telephone Encounter (Signed)
Returned pt call, she wanted advise on doing the nerve conduction test that Dr.[place] had ordered for her, I referred her to call and speak with Dr.[place] in regards to that.

## 2020-05-15 NOTE — Telephone Encounter (Signed)
Patient requesting call from nurse

## 2020-05-21 ENCOUNTER — Encounter: Payer: Medicare Other | Admitting: Neurology

## 2020-05-24 DIAGNOSIS — E039 Hypothyroidism, unspecified: Secondary | ICD-10-CM | POA: Diagnosis not present

## 2020-05-25 LAB — PHOSPHORUS: Phosphorus: 2.9 mg/dL — ABNORMAL LOW (ref 3.0–4.3)

## 2020-05-25 LAB — PTH, INTACT AND CALCIUM
Calcium: 10 mg/dL (ref 8.7–10.3)
PTH: 25 pg/mL (ref 15–65)

## 2020-05-25 LAB — MAGNESIUM: Magnesium: 1.3 mg/dL — ABNORMAL LOW (ref 1.6–2.3)

## 2020-05-25 LAB — T4, FREE: Free T4: 1.31 ng/dL (ref 0.82–1.77)

## 2020-05-25 LAB — TSH: TSH: 1.71 u[IU]/mL (ref 0.450–4.500)

## 2020-05-29 ENCOUNTER — Other Ambulatory Visit: Payer: Self-pay

## 2020-05-29 ENCOUNTER — Encounter: Payer: Self-pay | Admitting: "Endocrinology

## 2020-05-29 ENCOUNTER — Ambulatory Visit (INDEPENDENT_AMBULATORY_CARE_PROVIDER_SITE_OTHER): Payer: Medicare Other | Admitting: "Endocrinology

## 2020-05-29 DIAGNOSIS — E039 Hypothyroidism, unspecified: Secondary | ICD-10-CM

## 2020-05-29 MED ORDER — LEVOTHYROXINE SODIUM 25 MCG PO TABS
25.0000 ug | ORAL_TABLET | Freq: Every day | ORAL | 0 refills | Status: DC
Start: 2020-05-29 — End: 2020-07-22

## 2020-05-29 MED ORDER — LEVOTHYROXINE SODIUM 25 MCG PO TABS: 25.0000 ug | ORAL_TABLET | Freq: Every day | ORAL | 1 refills | Status: DC

## 2020-05-29 NOTE — Progress Notes (Signed)
Pt states she had to restart magnesium due to constipation.
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
## 2020-06-04 ENCOUNTER — Telehealth: Payer: Self-pay | Admitting: "Endocrinology

## 2020-06-04 NOTE — Telephone Encounter (Signed)
Pt is calling and states [facility] told her levothyroxine (SYNTHROID) 25 MCG tablet  Can not be filled until 06/09/20 per prescription. I do not see this on the RX. Patient states she gets them to delivery it to her and they do not deliver on the weekend she is going to need this on Friday atleast. She is requesting nurse give them a call and see if this can be fixed.

## 2020-06-04 NOTE — Telephone Encounter (Signed)
Returned call to pt, all set, they will deliver to her prior to weekend.

## 2020-06-04 NOTE — Telephone Encounter (Signed)
Pt requesting a call back. [phone]

## 2020-06-11 ENCOUNTER — Encounter: Payer: Self-pay | Admitting: Gastroenterology

## 2020-06-11 ENCOUNTER — Encounter: Payer: Self-pay | Admitting: *Deleted

## 2020-06-11 ENCOUNTER — Other Ambulatory Visit: Payer: Self-pay

## 2020-06-11 ENCOUNTER — Ambulatory Visit (INDEPENDENT_AMBULATORY_CARE_PROVIDER_SITE_OTHER): Payer: Medicare Other | Admitting: Gastroenterology

## 2020-06-11 VITALS — BP 143/79 | HR 85 | Temp 98.4°F | Ht 67.0 in | Wt 207.4 lb

## 2020-06-11 DIAGNOSIS — R1011 Right upper quadrant pain: Secondary | ICD-10-CM | POA: Diagnosis not present

## 2020-06-11 MED ORDER — NITROGLYCERIN 0.4 % RE OINT: 1.0000 [drp] | TOPICAL_OINTMENT | Freq: Three times a day (TID) | RECTAL | 1 refills | Status: DC

## 2020-06-11 MED ORDER — LIDOCAINE-HYDROCORTISONE ACE 3-2.5 % RE KIT
PACK | RECTAL | 11 refills | Status: DC
Start: 2020-06-11 — End: 2021-04-21

## 2020-06-11 NOTE — Patient Instructions (Signed)
We are ordering an ultrasound to be done this week.   Please have blood work done today.  Further recommendations to follow!   I enjoyed seeing you again today! As you know, I value our relationship and want to provide genuine, compassionate, and quality care. I welcome your feedback. If you receive a survey regarding your visit,  I greatly appreciate you taking time to fill this out. See you next time!  Annitta Needs, PhD, ANP-BC [facility]

## 2020-06-11 NOTE — Progress Notes (Signed)
Referring Provider: Alycia Rossetti, MD Primary Care Physician:  Alycia Rossetti, MD Primary GI: Dr. Abbey Chatters   Chief Complaint  Patient presents with  . Follow-up    refills     HPI:   [name] is a 69 y.o. female presenting today with a history of IBS, GERD, NASH, rectal bleeding felt to be related to anal fissure. Flex sig in June of 2019. Colonoscopy due in June 2022.   Labs April 2021 with AST 45, ALT 32. Has seen Dr. Constance Haw and was referred to [facility] but does not want to pursue this. Bad experience with hemorrhoidectomy in the past. As long as she has medication on hand, she does well. Uses as needed. If picking up anything heavy and straining, will have bleeding. Last used topical meds a month ago. Magnesium helps with her bowel movements. Helps to keep her regular and without flaring of fissure.   GERD controlled well with Dexilant. PCP refills this. Dicyclomine only as needed. Hasn't had to take it. Having pain in RUQ and through her back at times. Present about 4 weeks. Always underlying but waxes and wanes. Will feel like a stitch and get caught. Feels more swollen and sore in RUQ.   Unintentionally losing weight. Nov 2020 was 2020 now 207. TSH 1.710. Urine has started to get darker. Drinks lots of water. Urine color like a light tea color. Started about a week ago. No jaundice or scleral icterus.    Past Medical History:  Diagnosis Date  . Anxiety   . Arthritis   . Asthma   . Chest pain    a. 02/2000 Cath: nl cors, EF 60%;  b. 10/2010 MV: nl LV, no ischemia/infarct;  c. 03/2014 Admit c/p, r/o->grief from husbands death.  . Chronic RUQ pain 2007/12/06   EUS slightly dilated CBD (7.61m), otherwise nl  . Depression   . Eczema   . Exposure to chemical inhalation mid 1970s  . Fatty liver   . G6PD deficiency   . GERD (gastroesophageal reflux disease)   . Glaucoma   . History of pulmonary embolus (PE)    Treated with Eliquis 2Mar 02, 2016 . HTN (hypertension)   .  Hypercholesteremia    a. intolerant to statins.  . IBS (irritable bowel syndrome)   . Kidney stone   . 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 Palpitations    a. 05/2008 Echo: nl LV fxn.  .Marland KitchenPONV (postoperative nausea and vomiting)   . Recurrent upper respiratory infection (URI)   . Renal cyst   . [place]ickle cell trait ([place]   . Tubular adenoma of colon 09/17/2011   12/2010 Next colonoscopy 12/2015   . Urticaria     Past Surgical History:  Procedure Laterality Date  . ABDOMINAL HYSTERECTOMY    . ADENOIDECTOMY    . ANGIOPLASTY    . APPENDECTOMY    . BIOPSY N/A 03/14/2013   Procedure: SMALL BOWEL AND GASTRIC BIOPSIES (Procedure #1);  Surgeon: SDanie Binder MD;  Location: [facility];  Service: Endoscopy;  Laterality: N/A;  . CARDIAC CATHETERIZATION Left 02/11/2000  . CATARACT EXTRACTION Left   . CHOLECYSTECTOMY  12/2007  . COLONOSCOPY  12/22/10   RWVP:XTGGYI cecal adenomatous polyp  . COLONOSCOPY WITH PROPOFOL N/A 03/31/2016   Dr. FOneida Alar four sessile polyps rectum/sigmoid colon, diverticulosi, ext/int hemorrhoids. hyperplastic polyps, next tcs 5 years.  . complete hysterectomy    . ENTEROSCOPY N/A 03/14/2013   SRSW:NIOE  gastritis/ulcers has healed  . ESOPHAGOGASTRODUODENOSCOPY  09/22/2011   LYY:TKPT gastritis/Duodenitis  . FLEXIBLE SIGMOIDOSCOPY N/A 03/14/2013   SLF:3 colon polyp removed/moderate sized internal hemorrhoids  . FLEXIBLE SIGMOIDOSCOPY N/A 06/09/2016   Procedure: FLEXIBLE SIGMOIDOSCOPY;  Surgeon: Danie Binder, MD;  Location: [facility];  Service: Endoscopy;  Laterality: N/A;  rectal polyps times 2  . FLEXIBLE SIGMOIDOSCOPY N/A 03/18/2018   Procedure: FLEXIBLE SIGMOIDOSCOPY;  Surgeon: Danie Binder, MD;  Location: [facility];  Service: Endoscopy;  Laterality: N/A;  9:15am  . FOOT SURGERY     bunion removal left  . GIVENS CAPSULE STUDY N/A 02/10/2013   Procedure: GIVENS CAPSULE STUDY;  Surgeon: Danie Binder, MD;  Location: [facility];  Service:  Endoscopy;  Laterality: N/A;  730  . HEEL SPUR EXCISION     right   . HEMORRHOID BANDING N/A 03/14/2013   Procedure: HEMORRHOID BANDING (Procedure #3)  3 bands applied [id number] Exp 02/02/2014 ;  Surgeon: Danie Binder, MD;  Location: [facility];  Service: Endoscopy;  Laterality: N/A;  . HEMORRHOID SURGERY N/A 04/24/2016   Procedure: EXTENSIVE HEMORRHOIDECTOMY;  Surgeon: Vickie Epley, MD;  Location: [facility];  Service: General;  Laterality: N/A;  . LAPAROSCOPY     adhesions  . POLYPECTOMY N/A 03/14/2013   GYF:VCBS Gastritis . ULCERS SEEN ON MAY 6 HAVE HEALED  . POLYPECTOMY  03/31/2016   Procedure: POLYPECTOMY;  Surgeon: Danie Binder, MD;  Location: [facility];  Service: Endoscopy;;  sigmoid colon polyps x2, rectal polyps x2  . TONSILLECTOMY    . TUBAL LIGATION      Current Outpatient Medications  Medication Sig Dispense Refill  . albuterol (PROVENTIL) (2.5 MG/3ML) 0.083% nebulizer solution Take 3 mLs (2.5 mg total) by nebulization every 4 (four) hours as needed for wheezing or shortness of breath. 150 mL 3  . albuterol (VENTOLIN HFA) 108 (90 Base) MCG/ACT inhaler Inhale 2 puffs into the lungs every 4 (four) hours as needed for wheezing. 54 g 3  . ALPRAZolam (XANAX) 1 MG tablet Take 1 tablet (1 mg total) by mouth 2 (two) times daily as needed for anxiety. 180 tablet 3  . brimonidine (ALPHAGAN) 0.2 % ophthalmic solution Place 1 drop into both eyes 2 (two) times daily. 15 mL 3  . budesonide-formoterol (SYMBICORT) 160-4.5 MCG/ACT inhaler Inhale 2 puffs into the lungs as needed. 10.2 g 3  . cyanocobalamin 1000 MCG tablet Take 1,000 mcg by mouth daily.    . cyclobenzaprine (FLEXERIL) 10 MG tablet TAKE ONE TABLET BY MOUTH DAILY  as needed muscle spasms 90 tablet 3  . dexlansoprazole (DEXILANT) 60 MG capsule Take 1 capsule (60 mg total) by mouth daily. 90 capsule 3  . dicyclomine (BENTYL) 10 MG capsule Take one capsule before meals and at bedtime AS NEEDED for abdominal pain and frequent stool.  HOLD for constipation 120 capsule 1  . Ergocalciferol (VITAMIN D2) 50 MCG (2000 UT) TABS Take 2,000 Units by mouth daily. 90 tablet 3  . estradiol (ESTRACE VAGINAL) 0.1 MG/GM vaginal cream Place 1 Applicatorful vaginally daily as needed. 42.5 g 3  . fenofibrate (TRICOR) 145 MG tablet Take 1 tablet (145 mg total) by mouth daily. 90 tablet 3  . HYDROcodone-acetaminophen (NORCO/VICODIN) 5-325 MG tablet Take 1 tablet by mouth every 6 (six) hours as needed for moderate pain. 10 tablet 0  . hyoscyamine (ANASPAZ) 0.125 MG TBDP disintergrating tablet Place 0.125 mg under the tongue as needed.     . irbesartan (AVAPRO)  150 MG tablet Take 0.5 tablets (75 mg total) by mouth daily. 45 tablet 3  . latanoprost (XALATAN) 0.005 % ophthalmic solution Place 1 drop into both eyes at bedtime. 9 mL 3  . levothyroxine (SYNTHROID) 25 MCG tablet Take 1 tablet (25 mcg total) by mouth daily before breakfast. 30 tablet 0  . Lidocaine-Hydrocortisone Ace 3-2.5 % KIT APPLY TO RECTUM QID for 2 weeks then AS NEEDED FOR RECTAL PAIN OR BLEEDING 1 kit 11  . magnesium oxide (HM MAGNESIUM) 400 MG tablet Take 2 tablets (800 mg total) by mouth daily. (Patient taking differently: Take 400 mg by mouth daily. Takes 1 tablet daily.)    . meclizine (ANTIVERT) 12.5 MG tablet Take 1 tablet (12.5 mg total) by mouth 3 (three) times daily as needed for dizziness. 30 tablet 3  . Menthol, Topical Analgesic, (BIOFREEZE EX) Apply topically daily as needed (pain).     . metroNIDAZOLE (METROCREAM) 0.75 % cream Apply 1 application topically as needed. 45 g 3  . naproxen (NAPROSYN) 500 MG tablet Take 500 mg by mouth as needed.    . neomycin-polymyxin b-dexamethasone (MAXITROL) 3.5-10000-0.1 OINT Place 1 application into both eyes at bedtime as needed. 10.5 g 3  . nitroGLYCERIN (NITROLINGUAL) 0.4 MG/SPRAY spray Place 1 spray under the tongue every 5 (five) minutes as needed for chest pain. 12 g 3  . Nitroglycerin 0.4 % OINT Place 1 drop rectally 3 (three)  times daily. 30 g 1  . NON FORMULARY Take 1 tablet by mouth daily as needed (bloat). FD GUARD    . ondansetron (ZOFRAN) 4 MG tablet Take 1 tablet (4 mg total) by mouth every 8 (eight) hours as needed for nausea or vomiting. (Patient taking differently: Take 4 mg by mouth as needed for nausea or vomiting. ) 30 tablet 1  . Probiotic Product (PROBIOTIC DAILY PO) Take 1 capsule by mouth daily.    Marland Kitchen senna (SENOKOT) 8.6 MG TABS tablet Take 1 tablet by mouth daily as needed.     Marland Kitchen UNABLE TO FIND Med Name: lidocaine 5% pramoxine 1% ointment per rectum as needed.    Marland Kitchen XIIDRA 5 % SOLN INSTILL 1 DROP IN BOTH EYES EVERY DAY AS NEEDED (EACH CONTAINER SHOULD YIELD 2 DROPS) STORE IN ORIGINAL FOIL POUCH 60 each 11  . zolpidem (AMBIEN) 10 MG tablet TAKE ONE TABLET BY MOUTH DAILY AT BEDTIME. 90 tablet 3   Current Facility-Administered Medications  Medication Dose Route Frequency Provider Last Rate Last Admin  . promethazine (PHENERGAN) injection 25 mg  25 mg Intravenous Once Danie Binder, MD        Allergies as of 06/11/2020 - Review Complete 06/11/2020  Allergen Reaction Noted  . Adhesive [tape] Other (See Comments) 03/10/2013  . Aspirin Other (See Comments) 09/18/2011  . Keflex [cephalexin] Hives 11/26/2016  . Levaquin [levofloxacin in d5w]  12/29/2016  . Other  10/08/2017  . Statins Other (See Comments) 02/28/2013  . Sulfonamide derivatives Other (See Comments) 12/10/2010  . Latex Rash 12/13/2011    Family History  Problem Relation Age of Onset  . Colon cancer Mother        13s  . Urticaria Mother   . Hypertension Mother   . Cancer Mother   . Cancer Father        oral  . Crohn's disease Sister   . Colon cancer Maternal Grandfather   . Colon cancer Paternal Grandfather   . Diabetes Brother   . Colon cancer Maternal Grandmother   . Anesthesia  problems Neg Hx     Social History   Socioeconomic History  . Marital status: Widowed    Spouse name: Not on file  . Number of children: Not  on file  . Years of education: Not on file  . Highest education level: Not on file  Occupational History  . Occupation: homemaker    Employer: UNEMPLOYED  Tobacco Use  . Smoking status: Former Smoker    Packs/day: 0.50    Years: 30.00    Pack years: 15.00    Types: Cigarettes  . Smokeless tobacco: Former Systems developer    Quit date: 10/15/1996  . Tobacco comment: Stopped smoking ~ 2000  Vaping Use  . Vaping Use: Never used  Substance and Sexual Activity  . Alcohol use: Yes    Comment: occassion  . Drug use: No  . Sexual activity: Yes    Birth control/protection: Surgical  Other Topics Concern  . Not on file  Social History Narrative   Does not routinely exercise. Husband passed in JUN 2015 due to prostate ca.   Social Determinants of Health   Financial Resource Strain:   . Difficulty of Paying Living Expenses: Not on file  Food Insecurity:   . Worried About Charity fundraiser in the Last Year: Not on file  . Ran Out of Food in the Last Year: Not on file  Transportation Needs:   . Lack of Transportation (Medical): Not on file  . Lack of Transportation (Non-Medical): Not on file  Physical Activity:   . Days of Exercise per Week: Not on file  . Minutes of Exercise per Session: Not on file  Stress:   . Feeling of Stress : Not on file  Social Connections:   . Frequency of Communication with Friends and Family: Not on file  . Frequency of Social Gatherings with Friends and Family: Not on file  . Attends Religious Services: Not on file  . Active Member of Clubs or Organizations: Not on file  . Attends Archivist Meetings: Not on file  . Marital Status: Not on file    Review of Systems: Gen: see HPI CV: Denies chest pain, palpitations, syncope, peripheral edema, and claudication. Resp: Denies dyspnea at rest, cough, wheezing, coughing up blood, and pleurisy. GI: see HPI Derm: Denies rash, itching, dry skin Psych: Denies depression, anxiety, memory loss, confusion. No  homicidal or suicidal ideation.  Heme: Denies bruising, bleeding, and enlarged lymph nodes.  Physical Exam: BP (!) 143/79   Pulse 85   Temp 98.4 F (36.9 C) (Oral)   Ht 5' 7"  (1.702 m)   Wt 207 lb 6.4 oz (94.1 kg)   BMI 32.48 kg/m  General:   Alert and oriented. No distress noted. Pleasant and cooperative.  Head:  Normocephalic and atraumatic. Eyes:  Conjuctiva clear without scleral icterus. Mouth: mask in place Abdomen:  +BS, soft, TTP RUQ and non-distended. No rebound or guarding. No HSM or masses noted. Msk:  Symmetrical without gross deformities. Normal posture. Extremities:  Without edema. Neurologic:  Alert and  oriented x4 Psych:  Alert and cooperative. Normal mood and affect.  ASSESSMENT: [name] is a 69 y.o. female presenting today in follow-up for IBS, GERD, NASH, chronic anal fissure and electing not to have repair. Now with RUQ pain for past 4 weeks.  RUQ pain has been constantly underlying, waxing and waning, with gallbladder absent. Unintentional weight loss also noted per patient and some reports of darker urine. Will order labs, US abdomen  complete, and may need further imaging thereafter.   Colonoscopy will be due in 2022.    PLAN:  US abdomen complete  CBC, HFP, lipase  Refilled nitro and lidocaine, as she uses this intermittently  Colonoscopy in 2022  Further recommendations to follow   Annitta Needs, PhD, ANP-BC [facility]

## 2020-06-12 LAB — CBC WITH DIFFERENTIAL/PLATELET
Basophils Absolute: 0.1 10*3/uL (ref 0.0–0.2)
Basos: 1 %
EOS (ABSOLUTE): 0.3 10*3/uL (ref 0.0–0.4)
Eos: 5 %
Hematocrit: 40.3 % (ref 34.0–46.6)
Hemoglobin: 13.4 g/dL (ref 11.1–15.9)
Immature Grans (Abs): 0 10*3/uL (ref 0.0–0.1)
Immature Granulocytes: 0 %
Lymphocytes Absolute: 1.6 10*3/uL (ref 0.7–3.1)
Lymphs: 24 %
MCH: 31.8 pg (ref 26.6–33.0)
MCHC: 33.3 g/dL (ref 31.5–35.7)
MCV: 96 fL (ref 79–97)
Monocytes Absolute: 0.5 10*3/uL (ref 0.1–0.9)
Monocytes: 7 %
Neutrophils Absolute: 4.4 10*3/uL (ref 1.4–7.0)
Neutrophils: 63 %
Platelets: 226 10*3/uL (ref 150–450)
RBC: 4.21 x10E6/uL (ref 3.77–5.28)
RDW: 12 % (ref 11.7–15.4)
WBC: 6.9 10*3/uL (ref 3.4–10.8)

## 2020-06-12 LAB — LIPASE: Lipase: 45 U/L (ref 14–72)

## 2020-06-12 LAB — HEPATIC FUNCTION PANEL
ALT: 34 IU/L — ABNORMAL HIGH (ref 0–32)
AST: 46 IU/L — ABNORMAL HIGH (ref 0–40)
Albumin: 4.7 g/dL (ref 3.8–4.8)
Alkaline Phosphatase: 59 IU/L (ref 48–121)
Bilirubin Total: 0.5 mg/dL (ref 0.0–1.2)
Bilirubin, Direct: 0.24 mg/dL (ref 0.00–0.40)
Total Protein: 7.2 g/dL (ref 6.0–8.5)

## 2020-06-14 ENCOUNTER — Ambulatory Visit (HOSPITAL_COMMUNITY)
Admission: RE | Admit: 2020-06-14 | Discharge: 2020-06-14 | Disposition: A | Discharge status: Home / Self Care | Payer: Medicare Other | Source: Ambulatory Visit | Attending: Gastroenterology | Admitting: Gastroenterology

## 2020-06-14 ENCOUNTER — Other Ambulatory Visit: Payer: Self-pay

## 2020-06-14 DIAGNOSIS — R1011 Right upper quadrant pain: Secondary | ICD-10-CM | POA: Diagnosis not present

## 2020-06-14 DIAGNOSIS — Z9049 Acquired absence of other specified parts of digestive tract: Secondary | ICD-10-CM | POA: Diagnosis not present

## 2020-06-14 DIAGNOSIS — N1330 Unspecified hydronephrosis: Secondary | ICD-10-CM | POA: Diagnosis not present

## 2020-06-17 NOTE — Progress Notes (Signed)
Cc'ed to pcp °

## 2020-06-21 ENCOUNTER — Other Ambulatory Visit: Payer: Self-pay | Admitting: *Deleted

## 2020-06-21 DIAGNOSIS — K838 Other specified diseases of biliary tract: Secondary | ICD-10-CM

## 2020-06-28 ENCOUNTER — Ambulatory Visit (HOSPITAL_COMMUNITY)
Admission: RE | Admit: 2020-06-28 | Discharge: 2020-06-28 | Disposition: A | Discharge status: Home / Self Care | Payer: Medicare Other | Source: Ambulatory Visit | Attending: Family Medicine | Admitting: Family Medicine

## 2020-06-28 ENCOUNTER — Other Ambulatory Visit: Payer: Self-pay

## 2020-06-28 DIAGNOSIS — Z1231 Encounter for screening mammogram for malignant neoplasm of breast: Secondary | ICD-10-CM | POA: Diagnosis not present

## 2020-07-03 ENCOUNTER — Other Ambulatory Visit: Payer: Self-pay | Admitting: Gastroenterology

## 2020-07-03 ENCOUNTER — Other Ambulatory Visit: Payer: Self-pay

## 2020-07-03 ENCOUNTER — Ambulatory Visit (HOSPITAL_COMMUNITY)
Admission: RE | Admit: 2020-07-03 | Discharge: 2020-07-03 | Disposition: A | Discharge status: Home / Self Care | Payer: Medicare Other | Source: Ambulatory Visit | Attending: Gastroenterology | Admitting: Gastroenterology

## 2020-07-03 DIAGNOSIS — K760 Fatty (change of) liver, not elsewhere classified: Secondary | ICD-10-CM | POA: Diagnosis not present

## 2020-07-03 DIAGNOSIS — K838 Other specified diseases of biliary tract: Secondary | ICD-10-CM | POA: Diagnosis not present

## 2020-07-03 DIAGNOSIS — N281 Cyst of kidney, acquired: Secondary | ICD-10-CM | POA: Diagnosis not present

## 2020-07-03 DIAGNOSIS — K7689 Other specified diseases of liver: Secondary | ICD-10-CM | POA: Diagnosis not present

## 2020-07-03 MED ORDER — GADOBUTROL 1 MMOL/ML IV SOLN
10.0000 mL | Freq: Once | INTRAVENOUS | Status: AC | PRN
  Administered 2020-07-03: 10 mL via INTRAVENOUS

## 2020-07-04 ENCOUNTER — Telehealth: Payer: Self-pay | Admitting: Internal Medicine

## 2020-07-04 NOTE — Telephone Encounter (Signed)
Spoke with pt. Pt is aware that her MRI hasn't been reviewed yet and we will contact her when results are available.

## 2020-07-04 NOTE — Telephone Encounter (Signed)
PATIENT CALLED ABOUT MRI RESULTS AND WOULD LIKE TO BE CALLED ON HER CELL NUMBER WHEN THEY ARE COMPLETE

## 2020-07-10 ENCOUNTER — Telehealth: Payer: Self-pay | Admitting: Internal Medicine

## 2020-07-10 NOTE — Telephone Encounter (Signed)
Patient wanted to let Vicente Males know that she is feeling better

## 2020-07-10 NOTE — Telephone Encounter (Signed)
Spoke with pt. See result note.

## 2020-07-22 ENCOUNTER — Encounter: Payer: Self-pay | Admitting: Family Medicine

## 2020-07-22 ENCOUNTER — Ambulatory Visit (INDEPENDENT_AMBULATORY_CARE_PROVIDER_SITE_OTHER): Payer: Medicare Other | Admitting: Family Medicine

## 2020-07-22 ENCOUNTER — Other Ambulatory Visit: Payer: Self-pay

## 2020-07-22 VITALS — BP 124/72 | HR 90 | Temp 97.9°F | Resp 14 | Ht 67.0 in | Wt 211.0 lb

## 2020-07-22 DIAGNOSIS — Z23 Encounter for immunization: Secondary | ICD-10-CM | POA: Diagnosis not present

## 2020-07-22 DIAGNOSIS — I10 Essential (primary) hypertension: Secondary | ICD-10-CM

## 2020-07-22 DIAGNOSIS — G8929 Other chronic pain: Secondary | ICD-10-CM

## 2020-07-22 DIAGNOSIS — M48061 Spinal stenosis, lumbar region without neurogenic claudication: Secondary | ICD-10-CM | POA: Diagnosis not present

## 2020-07-22 DIAGNOSIS — M545 Low back pain, unspecified: Secondary | ICD-10-CM

## 2020-07-22 DIAGNOSIS — E039 Hypothyroidism, unspecified: Secondary | ICD-10-CM

## 2020-07-22 DIAGNOSIS — M5137 Other intervertebral disc degeneration, lumbosacral region: Secondary | ICD-10-CM | POA: Diagnosis not present

## 2020-07-22 DIAGNOSIS — M549 Dorsalgia, unspecified: Secondary | ICD-10-CM | POA: Insufficient documentation

## 2020-07-22 MED ORDER — LEVOTHYROXINE SODIUM 25 MCG PO TABS
25.0000 ug | ORAL_TABLET | Freq: Every day | ORAL | 3 refills | Status: DC
Start: 2020-07-22 — End: 2020-09-30

## 2020-07-22 MED ORDER — XIIDRA 5 % OP SOLN
OPHTHALMIC | 3 refills | Status: DC
Start: 2020-07-22 — End: 2020-11-25

## 2020-07-22 MED ORDER — IRBESARTAN 150 MG PO TABS
75.0000 mg | ORAL_TABLET | Freq: Every day | ORAL | 3 refills | Status: DC
Start: 2020-07-22 — End: 2020-11-25

## 2020-07-22 MED ORDER — HYDROCODONE-ACETAMINOPHEN 5-325 MG PO TABS
1.0000 | ORAL_TABLET | Freq: Four times a day (QID) | ORAL | 0 refills | Status: DC | PRN
Start: 2020-07-22 — End: 2020-11-25

## 2020-07-22 MED ORDER — FENOFIBRATE 145 MG PO TABS
145.0000 mg | ORAL_TABLET | Freq: Every day | ORAL | 3 refills | Status: DC
Start: 2020-07-22 — End: 2020-11-25

## 2020-07-22 MED ORDER — LATANOPROST 0.005 % OP SOLN
1.0000 [drp] | Freq: Every day | OPHTHALMIC | 3 refills | Status: DC
Start: 2020-07-22 — End: 2020-11-25

## 2020-07-22 MED ORDER — NITROGLYCERIN 0.4 MG/SPRAY TL SOLN: 1.0000 | 3 refills | Status: DC | PRN

## 2020-07-22 MED ORDER — ZOLPIDEM TARTRATE 10 MG PO TABS
ORAL_TABLET | ORAL | 3 refills | Status: DC
Start: 2020-07-22 — End: 2021-02-05

## 2020-07-22 MED ORDER — BRIMONIDINE TARTRATE 0.2 % OP SOLN
1.0000 [drp] | Freq: Two times a day (BID) | OPHTHALMIC | 3 refills | Status: DC
Start: 2020-07-22 — End: 2020-11-25

## 2020-07-22 MED ORDER — ONDANSETRON HCL 4 MG PO TABS: 4.0000 mg | ORAL_TABLET | Freq: Three times a day (TID) | ORAL | 1 refills | Status: DC | PRN

## 2020-07-22 NOTE — Assessment & Plan Note (Signed)
Followed by eye doctor,

## 2020-07-22 NOTE — Assessment & Plan Note (Signed)
Referral to chiropractor.  I also refilled her oxycodone she gets 10 tablets.

## 2020-07-22 NOTE — Assessment & Plan Note (Signed)
Pressure is controlled.  She will continue the low-dose irbesartan.  Regarding intermittent peripheral edema given her Lasix to have on hand as needed

## 2020-07-22 NOTE — Patient Instructions (Signed)
Lasix 71m prn  Keep irbesartan 744monce a day Referral chiropracter  We will call with lab results  F/U 4 months

## 2020-07-22 NOTE — Assessment & Plan Note (Signed)
Follow-up with endocrinology as scheduled levothyroxine refilled

## 2020-07-22 NOTE — Progress Notes (Signed)
Subjective:    Patient ID: [name], female    DOB: 1950/10/31, 69 y.o.   MRN: [id number]  Patient presents for Follow-up (is not fasting) and Medication Management (stopped some meds, noted BP elevated (158/95), restarted Irebesartan)   Pt here to f/u chronic medical problems.  She is here with a family friend who has been assisting her.   Hypothyroidism- on levothyroxine she is now followed by endocrinology.  States that she feels much better on the low-dose thyroid medication she has follow-up in a couple months but needs a prescription refills.   HTN- bp has gone up , she was taken off both HCTZ and irbesartan  bp spiked tpo  956'L systolic  restarted ibersartan 1 week ago and blood pressure has started coming back down  She has noticed swelling in her feet   She had hypercalcemia therefore the hydrochlorothiazide was stopped. He states that she does get some intermittent swelling though not severe.  She is not sure what she could use when she does get swelling.   She has pain on right side , had MRI performed by GI because of right upper quadrant pain right flank pain.  There was no sign of kidney stone.  Reviewed MRI results with her. Today she can proceed with  chiropractor for her Chronic back pain.  She needed a referral to do so.  IMPRESSION: 1. Mild extrahepatic biliary duct dilatation for stated patient age. No choledocholithiasis or obstructing mass lesion. 2. 1.9 cm well-defined T2 hyperintense lesion in the lower pole right kidney has layering debris/fluid. This is incompletely characterized on postcontrast imaging due to motion artifact. Statistically this likely represents a benign cyst. Follow-up CT abdomen with and without contrast recommended in 3 months to re-evaluate given better temporal resolution on CT imaging. 3. Hepatic steatosis.    Review Of Systems:  GEN- denies fatigue, fever, weight loss,weakness, recent illness HEENT- denies eye drainage,  change in vision, nasal discharge, CVS- denies chest pain, palpitations RESP- denies SOB, cough, wheeze ABD- denies N/V, change in stools, abd pain GU- denies dysuria, hematuria, dribbling, incontinence MSK- + joint pain, muscle aches, injury Neuro- denies headache, dizziness, syncope, seizure activity       Objective:    BP 124/72   Pulse 90   Temp 97.9 F (36.6 C) (Temporal)   Resp 14   Ht 5' 7"  (1.702 m)   Wt 211 lb (95.7 kg)   SpO2 95%   BMI 33.05 kg/m  GEN- NAD, alert and oriented x3 HEENT- PERRL, EOMI, non injected sclera, pink conjunctiva, MMM, oropharynx clear Neck- Supple, no thyromegaly CVS- RRR, no murmur RESP-CTAB ABD-NABS,soft,NT,ND MSK- Spine fair ROM ,neg SLR EXT- No edema Pulses- Radial, DP- 2+        Assessment & Plan:      Problem List Items Addressed This Visit      Unprioritized   Chronic back pain   Relevant Medications   HYDROcodone-acetaminophen (NORCO/VICODIN) 5-325 MG tablet   Other Relevant Orders   Ambulatory referral to Chiropractic   DDD (degenerative disc disease), lumbosacral    Referral to chiropractor.  I also refilled her oxycodone she gets 10 tablets.      Relevant Medications   HYDROcodone-acetaminophen (NORCO/VICODIN) 5-325 MG tablet   Other Relevant Orders   Ambulatory referral to Chiropractic   Essential hypertension, benign    Pressure is controlled.  She will continue the low-dose irbesartan.  Regarding intermittent peripheral edema given her Lasix to have on hand as  needed      Relevant Orders   BASIC METABOLIC PANEL WITH GFR   Hypothyroidism    Follow-up with endocrinology as scheduled levothyroxine refilled      Spinal stenosis at L4-L5 level   Relevant Orders   Ambulatory referral to Chiropractic    Other Visit Diagnoses    Need for immunization against influenza    -  Primary   Relevant Orders   Flu Vaccine QUAD High Dose(Fluad) (Completed)      Note: This dictation was prepared with Dragon  dictation along with smaller phrase technology. Any transcriptional errors that result from this process are unintentional.   /

## 2020-07-23 DIAGNOSIS — H401132 Primary open-angle glaucoma, bilateral, moderate stage: Secondary | ICD-10-CM | POA: Diagnosis not present

## 2020-07-23 LAB — BASIC METABOLIC PANEL WITH GFR
BUN/Creatinine Ratio: 13 (calc) (ref 6–22)
BUN: 20 mg/dL (ref 7–25)
CO2: 27 mmol/L (ref 20–32)
Calcium: 10 mg/dL (ref 8.6–10.4)
Chloride: 99 mmol/L (ref 98–110)
Creat: 1.5 mg/dL — ABNORMAL HIGH (ref 0.50–0.99)
GFR, Est African American: 41 mL/min/{1.73_m2} — ABNORMAL LOW (ref >=60)
GFR, Est Non African American: 36 mL/min/{1.73_m2} — ABNORMAL LOW (ref >=60)
Glucose, Bld: 107 mg/dL — ABNORMAL HIGH (ref 65–99)
Potassium: 4.1 mmol/L (ref 3.5–5.3)
Sodium: 137 mmol/L (ref 135–146)

## 2020-08-02 DIAGNOSIS — Z23 Encounter for immunization: Secondary | ICD-10-CM | POA: Diagnosis not present

## 2020-08-06 ENCOUNTER — Ambulatory Visit: Payer: Medicare Other | Admitting: Family Medicine

## 2020-08-09 ENCOUNTER — Other Ambulatory Visit: Payer: Self-pay

## 2020-08-09 ENCOUNTER — Other Ambulatory Visit: Payer: Medicare Other

## 2020-08-09 DIAGNOSIS — R7989 Other specified abnormal findings of blood chemistry: Secondary | ICD-10-CM | POA: Diagnosis not present

## 2020-08-10 LAB — BASIC METABOLIC PANEL
BUN/Creatinine Ratio: 13 (calc) (ref 6–22)
BUN: 17 mg/dL (ref 7–25)
CO2: 24 mmol/L (ref 20–32)
Calcium: 9.8 mg/dL (ref 8.6–10.4)
Chloride: 105 mmol/L (ref 98–110)
Creat: 1.3 mg/dL — ABNORMAL HIGH (ref 0.50–0.99)
Glucose, Bld: 96 mg/dL (ref 65–99)
Potassium: 4.3 mmol/L (ref 3.5–5.3)
Sodium: 139 mmol/L (ref 135–146)

## 2020-09-23 ENCOUNTER — Telehealth: Payer: Self-pay | Admitting: Internal Medicine

## 2020-09-23 DIAGNOSIS — E039 Hypothyroidism, unspecified: Secondary | ICD-10-CM | POA: Diagnosis not present

## 2020-09-23 NOTE — Telephone Encounter (Signed)
Recall should be for CT renal protocol per MRI result note. Letter mailed.

## 2020-09-23 NOTE — Telephone Encounter (Signed)
Recall for mri abd

## 2020-09-24 LAB — PTH, INTACT AND CALCIUM
Calcium: 9.8 mg/dL (ref 8.7–10.3)
PTH: 33 pg/mL (ref 15–65)

## 2020-09-24 LAB — T4, FREE: Free T4: 1.46 ng/dL (ref 0.82–1.77)

## 2020-09-24 LAB — VITAMIN D 25 HYDROXY (VIT D DEFICIENCY, FRACTURES): Vit D, 25-Hydroxy: 42.2 ng/mL (ref 30.0–100.0)

## 2020-09-24 LAB — PHOSPHORUS: Phosphorus: 3 mg/dL (ref 3.0–4.3)

## 2020-09-24 LAB — TSH: TSH: 1.77 u[IU]/mL (ref 0.450–4.500)

## 2020-09-24 LAB — MAGNESIUM: Magnesium: 1.5 mg/dL — ABNORMAL LOW (ref 1.6–2.3)

## 2020-09-30 ENCOUNTER — Encounter: Payer: Self-pay | Admitting: "Endocrinology

## 2020-09-30 ENCOUNTER — Ambulatory Visit (INDEPENDENT_AMBULATORY_CARE_PROVIDER_SITE_OTHER): Payer: Medicare Other | Admitting: "Endocrinology

## 2020-09-30 ENCOUNTER — Other Ambulatory Visit: Payer: Self-pay

## 2020-09-30 VITALS — BP 130/72 | HR 88 | Ht 67.0 in | Wt 209.6 lb

## 2020-09-30 DIAGNOSIS — E039 Hypothyroidism, unspecified: Secondary | ICD-10-CM

## 2020-09-30 MED ORDER — LEVOTHYROXINE SODIUM 25 MCG PO TABS
25.0000 ug | ORAL_TABLET | Freq: Every day | ORAL | 1 refills | Status: DC
Start: 2020-09-30 — End: 2021-03-31

## 2020-09-30 MED ORDER — LEVOTHYROXINE SODIUM 25 MCG PO TABS: 25.0000 ug | ORAL_TABLET | Freq: Every day | ORAL | 1 refills | Status: DC

## 2020-09-30 NOTE — Progress Notes (Signed)
09/30/2020, 12:49 PM  Endocrinology follow-up note   Subjective:    Patient ID: [name], female    DOB: December 07, 1950, PCP Alycia Rossetti, MD   Past Medical History:  Diagnosis Date  . Anxiety   . Arthritis   . Asthma   . Chest pain    a. 02/2000 Cath: nl cors, EF 60%;  b. 10/2010 MV: nl LV, no ischemia/infarct;  c. 03/2014 Admit c/p, r/o->grief from husbands death.  . Chronic RUQ pain December 24, 2007   EUS slightly dilated CBD (7.47m), otherwise nl  . Depression   . Eczema   . Exposure to chemical inhalation mid 1970s  . Fatty liver   . G6PD deficiency   . GERD (gastroesophageal reflux disease)   . Glaucoma   . History of pulmonary embolus (PE)    Treated with Eliquis 2Mar 20, 2016 . HTN (hypertension)   . Hypercholesteremia    a. intolerant to statins.  . IBS (irritable bowel syndrome)   . Kidney stone   . 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 Palpitations    a. 05/2008 Echo: nl LV fxn.  .Marland KitchenPONV (postoperative nausea and vomiting)   . Recurrent upper respiratory infection (URI)   . Renal cyst   . Sickle cell trait (HElko   . Tubular adenoma of colon 09/17/2011   12/2010 Next colonoscopy 12/2015   . Urticaria    Past Surgical History:  Procedure Laterality Date  . ABDOMINAL HYSTERECTOMY    . ADENOIDECTOMY    . ANGIOPLASTY    . APPENDECTOMY    . BIOPSY N/A 03/14/2013   Procedure: SMALL BOWEL AND GASTRIC BIOPSIES (Procedure #1);  Surgeon: SDanie Binder MD;  Location: [facility];  Service: Endoscopy;  Laterality: N/A;  . CARDIAC CATHETERIZATION Left 02/11/2000  . CATARACT EXTRACTION Left   . CHOLECYSTECTOMY  12/2007  . COLONOSCOPY  12/22/10   RHGD:JMEQAS cecal adenomatous polyp  . COLONOSCOPY WITH PROPOFOL N/A 03/31/2016   Dr. FOneida Alar four sessile polyps rectum/sigmoid colon, diverticulosi, ext/int hemorrhoids. hyperplastic polyps, next tcs 5 years.  . complete hysterectomy    . ENTEROSCOPY N/A 03/14/2013    STMH:DQQIgastritis/ulcers has healed  . ESOPHAGOGASTRODUODENOSCOPY  09/22/2011   SWLN:LGXQgastritis/Duodenitis  . FLEXIBLE SIGMOIDOSCOPY N/A 03/14/2013   SLF:3 colon polyp removed/moderate sized internal hemorrhoids  . FLEXIBLE SIGMOIDOSCOPY N/A 06/09/2016   Procedure: FLEXIBLE SIGMOIDOSCOPY;  Surgeon: SDanie Binder MD;  Location: [facility];  Service: Endoscopy;  Laterality: N/A;  rectal polyps times 2  . FLEXIBLE SIGMOIDOSCOPY N/A 03/18/2018   Procedure: FLEXIBLE SIGMOIDOSCOPY;  Surgeon: FDanie Binder MD;  Location: [facility];  Service: Endoscopy;  Laterality: N/A;  9:15am  . FOOT SURGERY     bunion removal left  . GIVENS CAPSULE STUDY N/A 02/10/2013   Procedure: GIVENS CAPSULE STUDY;  Surgeon: SDanie Binder MD;  Location: [facility];  Service: Endoscopy;  Laterality: N/A;  730  . HEEL SPUR EXCISION     right   . HEMORRHOID BANDING N/A 03/14/2013   Procedure: HEMORRHOID BANDING (Procedure #3)  3 bands applied [id number] 02/02/2014 ;  Surgeon: SDanie Binder MD;  Location: [facility];  Service: Endoscopy;  Laterality: N/A;  . HEMORRHOID SURGERY N/A 04/24/2016   Procedure: EXTENSIVE HEMORRHOIDECTOMY;  Surgeon: Vickie Epley, MD;  Location: [facility];  Service: General;  Laterality: N/A;  . LAPAROSCOPY     adhesions  . POLYPECTOMY N/A 03/14/2013   TMH:DQQI Gastritis . ULCERS SEEN ON MAY 6 HAVE HEALED  . POLYPECTOMY  03/31/2016   Procedure: POLYPECTOMY;  Surgeon: Danie Binder, MD;  Location: [facility];  Service: Endoscopy;;  sigmoid colon polyps x2, rectal polyps x2  . TONSILLECTOMY    . TUBAL LIGATION     Social History   Socioeconomic History  . Marital status: Widowed    Spouse name: Not on file  . Number of children: Not on file  . Years of education: Not on file  . Highest education level: Not on file  Occupational History  . Occupation: homemaker    Employer: UNEMPLOYED  Tobacco Use  . Smoking status: Former Smoker    Packs/day: 0.50    Years: 30.00     Pack years: 15.00    Types: Cigarettes  . Smokeless tobacco: Former Systems developer    Quit date: 10/15/1996  . Tobacco comment: Stopped smoking ~ 2000  Vaping Use  . Vaping Use: Never used  Substance and Sexual Activity  . Alcohol use: Yes    Comment: occassion  . Drug use: No  . Sexual activity: Yes    Birth control/protection: Surgical  Other Topics Concern  . Not on file  Social History Narrative   Does not routinely exercise. Husband passed in JUN 2015 due to prostate ca.   Social Determinants of Health   Financial Resource Strain: Not on file  Food Insecurity: Not on file  Transportation Needs: Not on file  Physical Activity: Not on file  Stress: Not on file  Social Connections: Not on file   Family History  Problem Relation Age of Onset  . Colon cancer Mother        85s  . Urticaria Mother   . Hypertension Mother   . Cancer Mother   . Cancer Father        oral  . Crohn's disease Sister   . Colon cancer Maternal Grandfather   . Colon cancer Paternal Grandfather   . Diabetes Brother   . Colon cancer Maternal Grandmother   . Anesthesia problems Neg Hx    Outpatient Encounter Medications as of 09/30/2020  Medication Sig  . albuterol (PROVENTIL) (2.5 MG/3ML) 0.083% nebulizer solution Take 3 mLs (2.5 mg total) by nebulization every 4 (four) hours as needed for wheezing or shortness of breath.  Marland Kitchen albuterol (VENTOLIN HFA) 108 (90 Base) MCG/ACT inhaler Inhale 2 puffs into the lungs every 4 (four) hours as needed for wheezing.  Marland Kitchen ALPRAZolam (XANAX) 1 MG tablet Take 1 tablet (1 mg total) by mouth 2 (two) times daily as needed for anxiety.  . brimonidine (ALPHAGAN) 0.2 % ophthalmic solution Place 1 drop into both eyes 2 (two) times daily.  . budesonide-formoterol (SYMBICORT) 160-4.5 MCG/ACT inhaler Inhale 2 puffs into the lungs as needed.  . cyanocobalamin 1000 MCG tablet Take 1,000 mcg by mouth daily.  . cyclobenzaprine (FLEXERIL) 10 MG tablet TAKE ONE TABLET BY MOUTH DAILY  as  needed muscle spasms  . dexlansoprazole (DEXILANT) 60 MG capsule Take 1 capsule (60 mg total) by mouth daily.  Marland Kitchen dicyclomine (BENTYL) 10 MG capsule Take one capsule before meals and at bedtime AS NEEDED for abdominal pain and frequent stool. HOLD for constipation  . Ergocalciferol (  VITAMIN D2) 50 MCG (2000 UT) TABS Take 2,000 Units by mouth daily.  Marland Kitchen estradiol (ESTRACE VAGINAL) 0.1 MG/GM vaginal cream Place 1 Applicatorful vaginally daily as needed.  . fenofibrate (TRICOR) 145 MG tablet Take 1 tablet (145 mg total) by mouth daily.  Marland Kitchen HYDROcodone-acetaminophen (NORCO/VICODIN) 5-325 MG tablet Take 1 tablet by mouth every 6 (six) hours as needed for moderate pain.  . hyoscyamine (ANASPAZ) 0.125 MG TBDP disintergrating tablet Place 0.125 mg under the tongue as needed.   . irbesartan (AVAPRO) 150 MG tablet Take 0.5 tablets (75 mg total) by mouth daily.  Marland Kitchen latanoprost (XALATAN) 0.005 % ophthalmic solution Place 1 drop into both eyes at bedtime.  Marland Kitchen levothyroxine (SYNTHROID) 25 MCG tablet Take 1 tablet (25 mcg total) by mouth daily before breakfast.  . Lidocaine-Hydrocortisone Ace 3-2.5 % KIT APPLY TO RECTUM QID for 2 weeks then AS NEEDED FOR RECTAL PAIN OR BLEEDING  . Lifitegrast (XIIDRA) 5 % SOLN INSTILL 1 DROP IN BOTH EYES EVERY DAY AS NEEDED (EACH CONTAINER SHOULD YIELD 2 DROPS) STORE IN ORIGINAL FOIL POUCH  . magnesium oxide (HM MAGNESIUM) 400 MG tablet Take 2 tablets (800 mg total) by mouth daily. (Patient taking differently: Take 400 mg by mouth daily. Takes 1 tablet daily.)  . meclizine (ANTIVERT) 12.5 MG tablet Take 1 tablet (12.5 mg total) by mouth 3 (three) times daily as needed for dizziness.  . Menthol, Topical Analgesic, (BIOFREEZE EX) Apply topically daily as needed (pain).   . metroNIDAZOLE (METROCREAM) 0.75 % cream Apply 1 application topically as needed.  . naproxen (NAPROSYN) 500 MG tablet Take 500 mg by mouth as needed.  . neomycin-polymyxin b-dexamethasone (MAXITROL) 3.5-10000-0.1 OINT  Place 1 application into both eyes at bedtime as needed.  . nitroGLYCERIN (NITROLINGUAL) 0.4 MG/SPRAY spray Place 1 spray under the tongue every 5 (five) minutes as needed for chest pain.  . NON FORMULARY Take 1 tablet by mouth daily as needed (bloat). FD GUARD  . ondansetron (ZOFRAN) 4 MG tablet Take 1 tablet (4 mg total) by mouth every 8 (eight) hours as needed for nausea or vomiting.  . Probiotic Product (PROBIOTIC DAILY PO) Take 1 capsule by mouth daily.  Marland Kitchen senna (SENOKOT) 8.6 MG TABS tablet Take 1 tablet by mouth daily as needed.   Marland Kitchen UNABLE TO FIND Med Name: lidocaine 5% pramoxine 1% ointment per rectum as needed.  . zolpidem (AMBIEN) 10 MG tablet TAKE ONE TABLET BY MOUTH DAILY AT BEDTIME.  . [DISCONTINUED] levothyroxine (SYNTHROID) 25 MCG tablet Take 1 tablet (25 mcg total) by mouth daily before breakfast.  . [DISCONTINUED] levothyroxine (SYNTHROID) 25 MCG tablet Take 1 tablet (25 mcg total) by mouth daily before breakfast.   Facility-Administered Encounter Medications as of 09/30/2020  Medication  . promethazine (PHENERGAN) injection 25 mg   ALLERGIES: Allergies  Allergen Reactions  . Adhesive [Tape] Other (See Comments)    Blisters skin  . Aspirin Other (See Comments)    sickle cell trait--  Not recommended unless emergency  . Keflex [Cephalexin] Hives  . Levaquin [Levofloxacin In D5w]     Altered mental status Affects G6PD  . Other     Nut Oil- skin irritation   . Statins Other (See Comments)    Myopathy - elevated CK  . Sulfonamide Derivatives Other (See Comments)    Patient has sickle cell trait  . Latex Rash    VACCINATION STATUS: Immunization History  Administered Date(s) Administered  . Fluad Quad(high Dose 65+) 06/05/2019, 07/22/2020  . Influenza Split 07/12/2012  .  Influenza,inj,Quad PF,6+ Mos 08/03/2014, 07/19/2015, 08/11/2016, 06/24/2017, 07/04/2018  . Moderna Sars-Covid-2 Vaccination 11/16/2019, 12/15/2019, 08/02/2020  . Pneumococcal Conjugate-13  02/15/2018  . Pneumococcal Polysaccharide-23 08/03/2014, 01/12/2020  . Tdap 08/25/2012, 08/03/2014  . Zoster 08/25/2012  . Zoster Recombinat (Shingrix) 01/16/2020, 03/21/2020    HPI ELPIDIA KARN is 69 y.o. female who presents today with a medical history as above. she is being seen in follow-up after she was seen in consultation for hypercalcemia which has since resolved, hypothyroidism, vitamin D deficiency.   PMD: Alycia Rossetti, MD.  She was seen with mild hypercalcemia, was advised to discontinue hydrochlorothiazide.  Her subsequent CMP showed normal calcium x3.  For mild hypothyroidism, she was initiated on low-dose levothyroxine-25 mcg p.o. daily before breakfast.  She remains consistent and compliant.  Her recent thyroid function tests are consistent with appropriate replacement.  She continues to feel better.  She has no new complaints today.   She denies any prior history of parathyroid/pituitary/adrenal dysfunction. -She does have a family history of some thyroid dysfunction in her cousins.   She has resumed magnesium 400 mg p.o. daily for hypomagnesemia.  Her previsit labs show slight improvement in her magnesium, but still low at 1.5.  She made some changes in her lifestyle including avoiding some processed carbs. Denies history of diabetes/prediabetes.  She is on multiple vitamin supplements/medications including inhalers for asthma.  Review of Systems Limited as above.  Objective:    Vitals with BMI 09/30/2020 07/22/2020 06/11/2020  Height 5' 7"  5' 7"  5' 7"   Weight 209 lbs 10 oz 211 lbs 207 lbs 6 oz  BMI 32.82 03.55 97.41  Systolic 638 453 646  Diastolic 72 72 79  Pulse 88 90 85    BP 130/72   Pulse 88   Ht 5' 7"  (1.702 m)   Wt 209 lb 9.6 oz (95.1 kg)   BMI 32.83 kg/m   Wt Readings from Last 3 Encounters:  09/30/20 209 lb 9.6 oz (95.1 kg)  07/22/20 211 lb (95.7 kg)  06/11/20 207 lb 6.4 oz (94.1 kg)    Physical Exam    CMP ( most recent) CMP      Component Value Date/Time   NA 139 08/09/2020 1420   NA 141 07/12/2012 0755   K 4.3 08/09/2020 1420   K 4.3 07/12/2012 0755   CL 105 08/09/2020 1420   CL 100 07/12/2012 0755   CO2 24 08/09/2020 1420   GLUCOSE 96 08/09/2020 1420   BUN 17 08/09/2020 1420   BUN 28 (A) 07/12/2012 0755   CREATININE 1.30 (H) 08/09/2020 1420   CALCIUM 9.8 09/23/2020 1359   CALCIUM 11.0 07/12/2012 0755   PROT 7.2 06/11/2020 1411   PROT 8.1 07/12/2012 0755   ALBUMIN 4.7 06/11/2020 1411   AST 46 (H) 06/11/2020 1411   AST 42 07/12/2012 0755   ALT 34 (H) 06/11/2020 1411   ALKPHOS 59 06/11/2020 1411   ALKPHOS 32 07/12/2012 0755   BILITOT 0.5 06/11/2020 1411   BILITOT 1.0 07/12/2012 0755   GFRNONAA 36 (L) 07/22/2020 1441   GFRAA 41 (L) 07/22/2020 1441     Diabetic Labs (most recent): Lab Results  Component Value Date   HGBA1C 5.4 06/14/2019   HGBA1C 5.4 07/04/2018   HGBA1C 5.4 07/29/2015     Lipid Panel ( most recent) Lipid Panel     Component Value Date/Time   CHOL 205 (H) 01/12/2020 0922   CHOL 254 07/12/2012 0755   TRIG 290 (H) 01/12/2020 8032  TRIG 325 07/12/2012 0755   HDL 26 (L) 01/12/2020 0922   CHOLHDL 7.9 (H) 01/12/2020 0922   VLDL 42 (H) 05/07/2017 0918   LDLCALC 134 (H) 01/12/2020 0922      Lab Results  Component Value Date   TSH 1.770 09/23/2020   TSH 1.710 05/24/2020   TSH 5.12 (H) 01/12/2020   TSH 1.66 11/24/2016   TSH 3.900 07/29/2015   TSH 2.917 08/03/2014   TSH 6.160 (H) 03/31/2014   TSH 2.026 02/07/2013   TSH 1.948 09/20/2012   TSH 1.652 10/01/2011   FREET4 1.46 09/23/2020   FREET4 1.31 05/24/2020   FREET4 1.3 01/12/2020   FREET4 1.19 08/03/2014      Assessment & Plan:   1. Hypercalcemia  2.  Hypomagnesemia -Her previsit labs continue to show normalization of calcium after her HCTZ was discontinued.   She will not need 24-hour urine calcium measurement for now.  She will have repeat CMP, PTH/calcium before her next visit in 4 months.  Hypomagnesemia,  improved but still low at 1.5.  She is advised to increase her magnesium to 400 mg p.o. twice daily for 60 days, and maintain at 400 mg p.o. daily afterwards.   3.  Hypothyroidism -Her previsit thyroid function tests are consistent with appropriate replacement.  She is advised to continue levothyroxine 25 mcg p.o. daily before breakfast.    - We discussed about the correct intake of her thyroid hormone, on empty stomach at fasting, with water, separated by at least 30 minutes from breakfast and other medications,  and separated by more than 4 hours from calcium, iron, multivitamins, acid reflux medications (PPIs). -Patient is made aware of the fact that thyroid hormone replacement is needed for life, dose to be adjusted by periodic monitoring of thyroid function tests.   -Her neck exam is negative for goiter, no need for thyroid imaging for now. Her supraclavicular fullness is due to increased intrathoracic pressure due to asthma/COPD.   -Regarding her concern about his weight:  - she acknowledges that there is a room for improvement in her food and drink choices. - Suggestion is made for her to avoid simple carbohydrates  from her diet including Cakes, Sweet Desserts, Ice Cream, Soda (diet and regular), Sweet Tea, Candies, Chips, Cookies, Store Bought Juices, Alcohol in Excess of  1-2 drinks a day, Artificial Sweeteners,  Coffee Creamer, and "Sugar-free" Products, Lemonade. This will help patient to have more stable blood glucose profile and potentially avoid unintended weight gain.   - she is advised to maintain close follow up with Alycia Rossetti, MD for primary care needs.      - Time spent on this patient care encounter:  20 minutes of which 50% was spent in  counseling and the rest reviewing  her current and  previous labs / studies and medications  doses and developing a plan for long term care. Alzena Gerber Odette  participated in the discussions, expressed understanding, and voiced  agreement with the above plans.  All questions were answered to her satisfaction. she is encouraged to contact clinic should she have any questions or concerns prior to her return visit.   Follow up plan: Return in about 6 months (around 03/31/2021) for F/U with Pre-visit Labs.   Glade Lloyd, MD [facility] [address] [place], [place] [address] Phone: [phone]  Fax: [phone]     09/30/2020, 12:49 PM  This note was partially dictated with voice recognition software. Similar sounding  words can be transcribed inadequately or may not  be corrected upon review.

## 2020-10-30 ENCOUNTER — Telehealth: Payer: Self-pay

## 2020-10-30 NOTE — Telephone Encounter (Signed)
noted 

## 2020-10-30 NOTE — Telephone Encounter (Signed)
FYI: Pt called and states she has not forgotten to call to get her CT renal protocol scheduled.  States she is scared to death to leave the house because of covid. She states she will call back once she is comfortable with going into Hospitals, Dr's office, etc. States she is ok for now.

## 2020-10-30 NOTE — Telephone Encounter (Signed)
OPENED IN ERROR

## 2020-11-25 ENCOUNTER — Other Ambulatory Visit: Payer: Self-pay

## 2020-11-25 ENCOUNTER — Ambulatory Visit (INDEPENDENT_AMBULATORY_CARE_PROVIDER_SITE_OTHER): Payer: Medicare Other | Admitting: Family Medicine

## 2020-11-25 ENCOUNTER — Encounter: Payer: Self-pay | Admitting: Family Medicine

## 2020-11-25 VITALS — BP 130/68 | HR 88 | Temp 98.0°F | Resp 14 | Ht 67.0 in | Wt 209.0 lb

## 2020-11-25 DIAGNOSIS — K7581 Nonalcoholic steatohepatitis (NASH): Secondary | ICD-10-CM

## 2020-11-25 DIAGNOSIS — M545 Low back pain, unspecified: Secondary | ICD-10-CM | POA: Diagnosis not present

## 2020-11-25 DIAGNOSIS — F419 Anxiety disorder, unspecified: Secondary | ICD-10-CM

## 2020-11-25 DIAGNOSIS — R7301 Impaired fasting glucose: Secondary | ICD-10-CM | POA: Diagnosis not present

## 2020-11-25 DIAGNOSIS — M159 Polyosteoarthritis, unspecified: Secondary | ICD-10-CM | POA: Diagnosis not present

## 2020-11-25 DIAGNOSIS — G8929 Other chronic pain: Secondary | ICD-10-CM

## 2020-11-25 DIAGNOSIS — H401132 Primary open-angle glaucoma, bilateral, moderate stage: Secondary | ICD-10-CM

## 2020-11-25 DIAGNOSIS — F5104 Psychophysiologic insomnia: Secondary | ICD-10-CM | POA: Diagnosis not present

## 2020-11-25 DIAGNOSIS — N182 Chronic kidney disease, stage 2 (mild): Secondary | ICD-10-CM

## 2020-11-25 DIAGNOSIS — J4520 Mild intermittent asthma, uncomplicated: Secondary | ICD-10-CM | POA: Diagnosis not present

## 2020-11-25 DIAGNOSIS — I10 Essential (primary) hypertension: Secondary | ICD-10-CM | POA: Diagnosis not present

## 2020-11-25 MED ORDER — HYDROCOD POLST-CPM POLST ER 10-8 MG/5ML PO SUER
5.0000 mL | Freq: Two times a day (BID) | ORAL | 0 refills | Status: DC | PRN
Start: 2020-11-25 — End: 2022-05-14

## 2020-11-25 MED ORDER — ESTRADIOL 0.1 MG/GM VA CREA: 1.0000 | TOPICAL_CREAM | Freq: Every day | VAGINAL | 3 refills | Status: DC | PRN

## 2020-11-25 MED ORDER — METRONIDAZOLE 0.75 % EX CREA: 1.0000 "application " | TOPICAL_CREAM | CUTANEOUS | 3 refills | Status: DC | PRN

## 2020-11-25 MED ORDER — ALPRAZOLAM 1 MG PO TABS: 1.0000 mg | ORAL_TABLET | Freq: Two times a day (BID) | ORAL | 3 refills | Status: DC | PRN

## 2020-11-25 MED ORDER — ALBUTEROL SULFATE HFA 108 (90 BASE) MCG/ACT IN AERS: 2.0000 | INHALATION_SPRAY | RESPIRATORY_TRACT | 3 refills | Status: DC | PRN

## 2020-11-25 MED ORDER — ONDANSETRON HCL 4 MG PO TABS: 4.0000 mg | ORAL_TABLET | Freq: Three times a day (TID) | ORAL | 1 refills | Status: DC | PRN

## 2020-11-25 MED ORDER — DICYCLOMINE HCL 10 MG PO CAPS: ORAL_CAPSULE | ORAL | 1 refills | Status: DC

## 2020-11-25 MED ORDER — DEXLANSOPRAZOLE 60 MG PO CPDR: 60.0000 mg | DELAYED_RELEASE_CAPSULE | Freq: Every day | ORAL | 3 refills | Status: DC

## 2020-11-25 MED ORDER — ALBUTEROL SULFATE (2.5 MG/3ML) 0.083% IN NEBU: 2.5000 mg | INHALATION_SOLUTION | RESPIRATORY_TRACT | 3 refills | Status: DC | PRN

## 2020-11-25 MED ORDER — IRBESARTAN 150 MG PO TABS: 75.0000 mg | ORAL_TABLET | Freq: Every day | ORAL | 3 refills | Status: DC

## 2020-11-25 MED ORDER — XIIDRA 5 % OP SOLN: OPHTHALMIC | 3 refills | Status: DC

## 2020-11-25 MED ORDER — BRIMONIDINE TARTRATE 0.2 % OP SOLN: 1.0000 [drp] | Freq: Two times a day (BID) | OPHTHALMIC | 3 refills | Status: DC

## 2020-11-25 MED ORDER — HYDROCODONE-ACETAMINOPHEN 5-325 MG PO TABS
1.0000 | ORAL_TABLET | Freq: Four times a day (QID) | ORAL | 0 refills | Status: DC | PRN
Start: 2020-11-25 — End: 2021-05-16

## 2020-11-25 MED ORDER — BUDESONIDE-FORMOTEROL FUMARATE 160-4.5 MCG/ACT IN AERO: 2.0000 | INHALATION_SPRAY | RESPIRATORY_TRACT | 3 refills | Status: DC | PRN

## 2020-11-25 MED ORDER — LATANOPROST 0.005 % OP SOLN: 1.0000 [drp] | Freq: Every day | OPHTHALMIC | 3 refills | Status: DC

## 2020-11-25 MED ORDER — MECLIZINE HCL 12.5 MG PO TABS: 12.5000 mg | ORAL_TABLET | Freq: Three times a day (TID) | ORAL | 3 refills | Status: DC | PRN

## 2020-11-25 MED ORDER — CYCLOBENZAPRINE HCL 10 MG PO TABS: ORAL_TABLET | ORAL | 3 refills | Status: DC

## 2020-11-25 MED ORDER — FENOFIBRATE 145 MG PO TABS: 145.0000 mg | ORAL_TABLET | Freq: Every day | ORAL | 3 refills | Status: DC

## 2020-11-25 NOTE — Assessment & Plan Note (Signed)
Blood pressure is controlled no change in medication.

## 2020-11-25 NOTE — Progress Notes (Signed)
Subjective:    Patient ID: [name], female    DOB: April 11, 1951, 70 y.o.   MRN: [id number]  Patient presents for Follow-up (Is not fasting/)  Patient here for follow-up on medical problems. She has a very complex history.  Medications reviewed.  Hypertension she is taking her blood pressure medicines as prescribed she has Lasix on would bring edema.  Hypothyroidism she follows with endocrinology she is on levothyroxine  Chronic back pain she has spinal stenosis degenerative disc disease she tried to see a chiropracter but cost was too much.  Takes hydrocodone rarely, needs refill   Has history of asthma, has albuterol/Symbicort  requested refill on tussionex, last bottle lasted over 1 year   History of glaucoma he is on 3 drops as an appointment in March with her ophthalmologist.  He is also scheduled to have cataract surgery.    She is due for fasting labs.   Review Of Systems:  GEN- denies fatigue, fever, weight loss,weakness, recent illness HEENT- denies eye drainage, change in vision, nasal discharge, CVS- denies chest pain, palpitations RESP- denies SOB, cough, wheeze ABD- denies N/V, change in stools, abd pain GU- denies dysuria, hematuria, dribbling, incontinence MSK-+joint pain, +muscle aches, injury Neuro- denies headache, dizziness, syncope, seizure activity       Objective:    BP 130/68   Pulse 88   Temp 98 F (36.7 C) (Temporal)   Resp 14   Ht 5' 7"  (1.702 m)   Wt 209 lb (94.8 kg)   SpO2 97%   BMI 32.73 kg/m  GEN- NAD, alert and oriented x3 HEENT- PERRL, EOMI, non injected sclera, pink conjunctiva, MMM, oropharynx clear Neck- Supple, no thyromegaly CVS- RRR, no murmur RESP-CTAB ABD-NABS,soft,NT,ND Psych- normal affect and mood EXT- No edema Pulses- Radial, DP- 2+        Assessment & Plan:      Problem List Items Addressed This Visit      Unprioritized   Anxiety   Relevant Medications   ALPRAZolam (XANAX) 1 MG tablet   Asthma     Her albuterol little more during the winter.  Overall she has been stable.  She is on Symbicort for maintenance.  Medications refilled along with cough medicine      Relevant Medications   albuterol (VENTOLIN HFA) 108 (90 Base) MCG/ACT inhaler   albuterol (PROVENTIL) (2.5 MG/3ML) 0.083% nebulizer solution   budesonide-formoterol (SYMBICORT) 160-4.5 MCG/ACT inhaler   Chronic back pain   Relevant Medications   HYDROcodone-acetaminophen (NORCO/VICODIN) 5-325 MG tablet   cyclobenzaprine (FLEXERIL) 10 MG tablet   Chronic insomnia   CKD (chronic kidney disease), stage II   Relevant Orders   Comprehensive metabolic panel   Essential hypertension, benign - Primary    Blood pressure is controlled no change in medication.      Relevant Medications   fenofibrate (TRICOR) 145 MG tablet   irbesartan (AVAPRO) 150 MG tablet   Other Relevant Orders   CBC with Differential/Platelet   Comprehensive metabolic panel   Lipid panel   Generalized OA    She takes acetaminophen or when severe hydrocodone.  She gets 10 tablets of hydrocodone      Relevant Medications   HYDROcodone-acetaminophen (NORCO/VICODIN) 5-325 MG tablet   cyclobenzaprine (FLEXERIL) 10 MG tablet   Nonalcoholic steatohepatitis (NASH)    She does follow with gastroenterology. She is on TriCor.      Relevant Orders   Lipid panel   Primary open angle glaucoma (POAG) of both eyes  I have refilled her eyedrops.  She has an upcoming appointment.      Relevant Medications   brimonidine (ALPHAGAN) 0.2 % ophthalmic solution   latanoprost (XALATAN) 0.005 % ophthalmic solution   Lifitegrast (XIIDRA) 5 % SOLN    Other Visit Diagnoses    Elevated fasting glucose       Relevant Orders   Hemoglobin A1c      Note: This dictation was prepared with Dragon dictation along with smaller phrase technology. Any transcriptional errors that result from this process are unintentional.

## 2020-11-25 NOTE — Assessment & Plan Note (Addendum)
She does follow with gastroenterology. She is on TriCor.

## 2020-11-25 NOTE — Assessment & Plan Note (Signed)
Her albuterol little more during the winter.  Overall she has been stable.  She is on Symbicort for maintenance.  Medications refilled along with cough medicine

## 2020-11-25 NOTE — Assessment & Plan Note (Signed)
I have refilled her eyedrops.  She has an upcoming appointment.

## 2020-11-25 NOTE — Assessment & Plan Note (Signed)
She takes acetaminophen or when severe hydrocodone.  She gets 10 tablets of hydrocodone

## 2020-11-25 NOTE — Patient Instructions (Signed)
Get your fasting labs with new PCP Medications refilled

## 2020-12-03 ENCOUNTER — Telehealth: Payer: Self-pay | Admitting: Internal Medicine

## 2020-12-03 DIAGNOSIS — N289 Disorder of kidney and ureter, unspecified: Secondary | ICD-10-CM

## 2020-12-03 NOTE — Telephone Encounter (Signed)
Pt received letter that it was time to schedule her CT. She can not do 3/25, 4/6, 4/12

## 2020-12-03 NOTE — Telephone Encounter (Signed)
CT abd w/wo contrast (renal protocol) scheduled for 12/30/20 at 9:00am, arrive at 8:30am. Liquids only for 4 hours before test. Central scheduler advised pt doesn't pickup contrast.  Tried to call pt to inform her of CT appt, LMOVM to inform CT has been scheduled. Appt letter mailed.

## 2020-12-09 ENCOUNTER — Telehealth: Payer: Self-pay | Admitting: Family Medicine

## 2020-12-09 NOTE — Telephone Encounter (Signed)
CB# [phone]  Patient is needing a refill on her zolpidem she uses [facility]. She has a new provider but will run out before she sees the new provider.

## 2020-12-11 DIAGNOSIS — K7581 Nonalcoholic steatohepatitis (NASH): Secondary | ICD-10-CM | POA: Diagnosis not present

## 2020-12-11 DIAGNOSIS — R7301 Impaired fasting glucose: Secondary | ICD-10-CM | POA: Diagnosis not present

## 2020-12-11 DIAGNOSIS — N182 Chronic kidney disease, stage 2 (mild): Secondary | ICD-10-CM | POA: Diagnosis not present

## 2020-12-11 DIAGNOSIS — I10 Essential (primary) hypertension: Secondary | ICD-10-CM | POA: Diagnosis not present

## 2020-12-12 LAB — COMPREHENSIVE METABOLIC PANEL
AG Ratio: 1.8 (calc) (ref 1.0–2.5)
ALT: 32 U/L — ABNORMAL HIGH (ref 6–29)
AST: 39 U/L — ABNORMAL HIGH (ref 10–35)
Albumin: 4.6 g/dL (ref 3.6–5.1)
Alkaline phosphatase (APISO): 53 U/L (ref 37–153)
BUN/Creatinine Ratio: 15 (calc) (ref 6–22)
BUN: 19 mg/dL (ref 7–25)
CO2: 20 mmol/L (ref 20–32)
Calcium: 10.3 mg/dL (ref 8.6–10.4)
Chloride: 103 mmol/L (ref 98–110)
Creat: 1.23 mg/dL — ABNORMAL HIGH (ref 0.50–0.99)
Globulin: 2.5 g/dL (calc) (ref 1.9–3.7)
Glucose, Bld: 119 mg/dL — ABNORMAL HIGH (ref 65–99)
Potassium: 4.5 mmol/L (ref 3.5–5.3)
Sodium: 137 mmol/L (ref 135–146)
Total Bilirubin: 0.8 mg/dL (ref 0.2–1.2)
Total Protein: 7.1 g/dL (ref 6.1–8.1)

## 2020-12-12 LAB — CBC WITH DIFFERENTIAL/PLATELET
Absolute Monocytes: 442 cells/uL (ref 200–950)
Basophils Absolute: 90 cells/uL (ref 0–200)
Basophils Relative: 1.3 %
Eosinophils Absolute: 331 cells/uL (ref 15–500)
Eosinophils Relative: 4.8 %
HCT: 39.1 % (ref 35.0–45.0)
Hemoglobin: 13.7 g/dL (ref 11.7–15.5)
Lymphs Abs: 2084 cells/uL (ref 850–3900)
MCH: 32.6 pg (ref 27.0–33.0)
MCHC: 35 g/dL (ref 32.0–36.0)
MCV: 93.1 fL (ref 80.0–100.0)
MPV: 10.9 fL (ref 7.5–12.5)
Monocytes Relative: 6.4 %
Neutro Abs: 3954 cells/uL (ref 1500–7800)
Neutrophils Relative %: 57.3 %
Platelets: 264 10*3/uL (ref 140–400)
RBC: 4.2 10*6/uL (ref 3.80–5.10)
RDW: 11.6 % (ref 11.0–15.0)
Total Lymphocyte: 30.2 %
WBC: 6.9 10*3/uL (ref 3.8–10.8)

## 2020-12-12 LAB — LIPID PANEL
Cholesterol: 228 mg/dL — ABNORMAL HIGH (ref <200)
HDL: 33 mg/dL — ABNORMAL LOW (ref >=50)
LDL Cholesterol (Calc): 156 mg/dL (calc) — ABNORMAL HIGH
Non-HDL Cholesterol (Calc): 195 mg/dL (calc) — ABNORMAL HIGH (ref <130)
Total CHOL/HDL Ratio: 6.9 (calc) — ABNORMAL HIGH (ref <5.0)
Triglycerides: 231 mg/dL — ABNORMAL HIGH (ref <150)

## 2020-12-12 LAB — HEMOGLOBIN A1C
Hgb A1c MFr Bld: 5.1 % of total Hgb (ref <5.7)
Mean Plasma Glucose: 100 mg/dL
eAG (mmol/L): 5.5 mmol/L

## 2020-12-16 ENCOUNTER — Encounter: Payer: Self-pay | Admitting: *Deleted

## 2020-12-18 ENCOUNTER — Ambulatory Visit: Admit: 2020-12-18 | Payer: Medicare Other | Admitting: Ophthalmology

## 2020-12-18 SURGERY — PHACOEMULSIFICATION, CATARACT, WITH IOL INSERTION
Anesthesia: Topical | Laterality: Right

## 2020-12-30 ENCOUNTER — Ambulatory Visit (HOSPITAL_COMMUNITY)
Admission: RE | Admit: 2020-12-30 | Discharge: 2020-12-30 | Disposition: A | Discharge status: Home / Self Care | Payer: Medicare Other | Source: Ambulatory Visit | Attending: Gastroenterology | Admitting: Gastroenterology

## 2020-12-30 DIAGNOSIS — N281 Cyst of kidney, acquired: Secondary | ICD-10-CM | POA: Diagnosis not present

## 2020-12-30 DIAGNOSIS — R8290 Unspecified abnormal findings in urine: Secondary | ICD-10-CM | POA: Diagnosis not present

## 2020-12-30 DIAGNOSIS — M549 Dorsalgia, unspecified: Secondary | ICD-10-CM | POA: Diagnosis not present

## 2020-12-30 DIAGNOSIS — R634 Abnormal weight loss: Secondary | ICD-10-CM | POA: Diagnosis not present

## 2020-12-30 DIAGNOSIS — N289 Disorder of kidney and ureter, unspecified: Secondary | ICD-10-CM | POA: Insufficient documentation

## 2020-12-30 MED ORDER — IOHEXOL 300 MG/ML  SOLN
100.0000 mL | Freq: Once | INTRAMUSCULAR | Status: AC | PRN
  Administered 2020-12-30: 100 mL via INTRAVENOUS

## 2020-12-31 ENCOUNTER — Encounter: Payer: Self-pay | Admitting: Internal Medicine

## 2021-01-08 ENCOUNTER — Other Ambulatory Visit: Payer: Self-pay

## 2021-01-08 ENCOUNTER — Ambulatory Visit (INDEPENDENT_AMBULATORY_CARE_PROVIDER_SITE_OTHER): Payer: Medicare Other | Admitting: Internal Medicine

## 2021-01-08 ENCOUNTER — Encounter: Payer: Self-pay | Admitting: Internal Medicine

## 2021-01-08 VITALS — BP 130/73 | HR 90 | Temp 98.4°F | Resp 18 | Ht 67.0 in | Wt 208.1 lb

## 2021-01-08 DIAGNOSIS — Z7689 Persons encountering health services in other specified circumstances: Secondary | ICD-10-CM | POA: Diagnosis not present

## 2021-01-08 DIAGNOSIS — J4520 Mild intermittent asthma, uncomplicated: Secondary | ICD-10-CM | POA: Diagnosis not present

## 2021-01-08 DIAGNOSIS — K219 Gastro-esophageal reflux disease without esophagitis: Secondary | ICD-10-CM | POA: Diagnosis not present

## 2021-01-08 DIAGNOSIS — K601 Chronic anal fissure: Secondary | ICD-10-CM | POA: Diagnosis not present

## 2021-01-08 DIAGNOSIS — R0789 Other chest pain: Secondary | ICD-10-CM | POA: Diagnosis not present

## 2021-01-08 DIAGNOSIS — M159 Polyosteoarthritis, unspecified: Secondary | ICD-10-CM

## 2021-01-08 DIAGNOSIS — I10 Essential (primary) hypertension: Secondary | ICD-10-CM

## 2021-01-08 DIAGNOSIS — G8929 Other chronic pain: Secondary | ICD-10-CM

## 2021-01-08 DIAGNOSIS — E039 Hypothyroidism, unspecified: Secondary | ICD-10-CM | POA: Diagnosis not present

## 2021-01-08 DIAGNOSIS — F419 Anxiety disorder, unspecified: Secondary | ICD-10-CM | POA: Diagnosis not present

## 2021-01-08 DIAGNOSIS — K7581 Nonalcoholic steatohepatitis (NASH): Secondary | ICD-10-CM

## 2021-01-08 DIAGNOSIS — F5104 Psychophysiologic insomnia: Secondary | ICD-10-CM

## 2021-01-08 DIAGNOSIS — M545 Low back pain, unspecified: Secondary | ICD-10-CM | POA: Diagnosis not present

## 2021-01-08 NOTE — Assessment & Plan Note (Signed)
Follows up with GI

## 2021-01-08 NOTE — Assessment & Plan Note (Signed)
Intermittent episodes Could be related to anxiety/panic episodes But nitro makes pain better, would refer to Cardiology for possible stress test

## 2021-01-08 NOTE — Assessment & Plan Note (Signed)
Takes Tylenol PRN Norco only for severe pain

## 2021-01-08 NOTE — Assessment & Plan Note (Signed)
H/o panic disorder as well Xanax 1 mg BID PRN, takes it only for panic episodes

## 2021-01-08 NOTE — Assessment & Plan Note (Signed)
On Dexilant

## 2021-01-08 NOTE — Assessment & Plan Note (Signed)
On Levothyroxine Follows up with Dr Dorris Fetch

## 2021-01-08 NOTE — Progress Notes (Signed)
New Patient Office Visit  Subjective:  Patient ID: [name], female    DOB: 13-Mar-1951  Age: 70 y.o. MRN: [id number]  CC:  Chief Complaint  Patient presents with  . New Patient (Initial Visit)    New patient about a week ago she felt like she was floating on air and had to go lay down also would like a referral to cardio as she was seeing them before but she has been having some strange feelings in chest followed by headaches     HPI [name] is a 70 year old female with PMH of HTN, GERD, IBS-C, NASH, anal fissure, hypothyroidism, OA, DDD of lumbar spine, anxiety/panic disorder, insomnia, left sided blind eye, G6PD mutation and sickle cell trait who presents for establishing care. She is a former patient of Dr Buelah Manis.  She has been having episodes of chest pain intermittently, and used to see Cardiology in the past. She takes Xanax when the pain starts and later takes Nitro if pain persists. Denies any relationship with exertion. Pain is squeezing in nature, lasts for few mins and is non-radiating. She takes Irbesartan for HTN. BP is well-controlled.  She has occasional headaches on the right side, which gets better with ice pack application. She attributes it to weather change as well.  She has h/o chronic anal and rectal pain. 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  She follows up with Ophthalmology for glaucoma and cataract. She has left eye blindness. She is going to get cataract surgery on the right side.  She is up-to-date with COVID vaccine.  Past Medical History:  Diagnosis Date  . Anxiety   . Arthritis   . Asthma   . Cataract    Phreesia 01/05/2021  . Chest pain    a. 02/2000 Cath: nl cors, EF 60%;  b. 11-26-10 MV: nl LV, no ischemia/infarct;  c. 03/2014 Admit c/p, r/o->grief from husbands death.  . Chronic RUQ pain 11/27/2007   EUS slightly dilated CBD (7.6m), otherwise nl  . Depression   . Eczema   .  Exposure to chemical inhalation mid 1970s  . Fatty liver   . G6PD deficiency   . GERD (gastroesophageal reflux disease)   . Glaucoma   . Glaucoma    Phreesia 01/05/2021  . History of pulmonary embolus (PE)    Treated with Eliquis 22016/02/22 . HTN (hypertension)   . Hypercholesteremia    a. intolerant to statins.  . IBS (irritable bowel syndrome)   . Kidney stone   . 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 Palpitations    a. 05/2008 Echo: nl LV fxn.  .Marland KitchenPONV (postoperative nausea and vomiting)   . Recurrent upper respiratory infection (URI)   . Renal cyst   . Sickle cell trait (HLucas   . Tubular adenoma of colon 09/17/2011   12/2010 Next colonoscopy 12/2015   . Urticaria     Past Surgical History:  Procedure Laterality Date  . ABDOMINAL HYSTERECTOMY    . ADENOIDECTOMY    . ANGIOPLASTY    . APPENDECTOMY    . BIOPSY N/A 03/14/2013   Procedure: SMALL BOWEL AND GASTRIC BIOPSIES (Procedure #1);  Surgeon: SDanie Binder MD;  Location: [facility];  Service: Endoscopy;  Laterality: N/A;  . CARDIAC CATHETERIZATION Left 02/11/2000  . CATARACT EXTRACTION Left   . CHOLECYSTECTOMY  12/2007  . COLONOSCOPY  12/22/10   RTOI:ZTIWPY  cecal adenomatous polyp  . COLONOSCOPY WITH PROPOFOL N/A 03/31/2016   Dr. Oneida Alar: four sessile polyps rectum/sigmoid colon, diverticulosi, ext/int hemorrhoids. hyperplastic polyps, next tcs 5 years.  . complete hysterectomy    . ENTEROSCOPY N/A 03/14/2013   XNA:TFTD gastritis/ulcers has healed  . ESOPHAGOGASTRODUODENOSCOPY  09/22/2011   DUK:GURK gastritis/Duodenitis  . FLEXIBLE SIGMOIDOSCOPY N/A 03/14/2013   SLF:3 colon polyp removed/moderate sized internal hemorrhoids  . FLEXIBLE SIGMOIDOSCOPY N/A 06/09/2016   Procedure: FLEXIBLE SIGMOIDOSCOPY;  Surgeon: Danie Binder, MD;  Location: [facility];  Service: Endoscopy;  Laterality: N/A;  rectal polyps times 2  . FLEXIBLE SIGMOIDOSCOPY N/A 03/18/2018   Procedure: FLEXIBLE SIGMOIDOSCOPY;  Surgeon: Danie Binder, MD;  Location: [facility];  Service: Endoscopy;  Laterality: N/A;  9:15am  . FOOT SURGERY     bunion removal left  . GIVENS CAPSULE STUDY N/A 02/10/2013   Procedure: GIVENS CAPSULE STUDY;  Surgeon: Danie Binder, MD;  Location: [facility];  Service: Endoscopy;  Laterality: N/A;  730  . HEEL SPUR EXCISION     right   . HEMORRHOID BANDING N/A 03/14/2013   Procedure: HEMORRHOID BANDING (Procedure #3)  3 bands applied YHC#[id number] Exp 02/02/2014 ;  Surgeon: Danie Binder, MD;  Location: [facility];  Service: Endoscopy;  Laterality: N/A;  . HEMORRHOID SURGERY N/A 04/24/2016   Procedure: EXTENSIVE HEMORRHOIDECTOMY;  Surgeon: Vickie Epley, MD;  Location: [facility];  Service: General;  Laterality: N/A;  . LAPAROSCOPY     adhesions  . POLYPECTOMY N/A 03/14/2013   TDV:VOHY Gastritis . ULCERS SEEN ON MAY 6 HAVE HEALED  . POLYPECTOMY  03/31/2016   Procedure: POLYPECTOMY;  Surgeon: Danie Binder, MD;  Location: [facility];  Service: Endoscopy;;  sigmoid colon polyps x2, rectal polyps x2  . TONSILLECTOMY    . TUBAL LIGATION      Family History  Problem Relation Age of Onset  . Colon cancer Mother        83s  . Urticaria Mother   . Hypertension Mother   . Cancer Mother   . Cancer Father        oral  . Crohn's disease Sister   . Colon cancer Maternal Grandfather   . Colon cancer Paternal Grandfather   . Diabetes Brother   . Colon cancer Maternal Grandmother   . Anesthesia problems Neg Hx     Social History   Socioeconomic History  . Marital status: Widowed    Spouse name: Not on file  . Number of children: Not on file  . Years of education: Not on file  . Highest education level: Not on file  Occupational History  . Occupation: homemaker    Employer: UNEMPLOYED  Tobacco Use  . Smoking status: Former Smoker    Packs/day: 0.50    Years: 30.00    Pack years: 15.00    Types: Cigarettes  . Smokeless tobacco: Former Systems developer    Quit date: 10/15/1996  . Tobacco comment:  Stopped smoking ~ 2000  Vaping Use  . Vaping Use: Never used  Substance and Sexual Activity  . Alcohol use: Yes    Comment: occassion  . Drug use: No  . Sexual activity: Yes    Birth control/protection: Surgical  Other Topics Concern  . Not on file  Social History Narrative   Does not routinely exercise. Husband passed in JUN 2015 due to prostate ca.   Social Determinants of Health   Financial Resource Strain: Not on file  Food Insecurity: Not on file  Transportation Needs: Not on file  Physical Activity: Not on file  Stress: Not on file  Social Connections: Not on file  Intimate Partner Violence: Not on file    ROS Review of Systems  Constitutional: Negative for chills and fever.  HENT: Negative for congestion, sinus pressure, sinus pain and sore throat.   Eyes: Negative for pain and discharge.  Respiratory: Negative for cough and shortness of breath.   Cardiovascular: Positive for chest pain. Negative for palpitations.  Gastrointestinal: Positive for blood in stool, constipation and rectal pain. Negative for abdominal pain, diarrhea, nausea and vomiting.  Endocrine: Negative for polydipsia and polyuria.  Genitourinary: Negative for dysuria and hematuria.  Musculoskeletal: Negative for neck pain and neck stiffness.  Skin: Negative for rash.  Neurological: Positive for headaches. Negative for dizziness and weakness.  Psychiatric/Behavioral: Negative for agitation and behavioral problems.    Objective:   Today's Vitals: BP 130/73 (BP Location: Right Arm, Patient Position: Sitting, Cuff Size: Normal)   Pulse 90   Temp 98.4 F (36.9 C) (Oral)   Resp 18   Ht 5' 7" (1.702 m)   Wt 208 lb 1.9 oz (94.4 kg)   SpO2 94%   BMI 32.60 kg/m   Physical Exam Vitals reviewed.  Constitutional:      General: She is not in acute distress.    Appearance: She is not diaphoretic.  HENT:     Head: Normocephalic and atraumatic.     Nose: Nose normal.     Mouth/Throat:     Mouth:  Mucous membranes are moist.  Eyes:     General: No scleral icterus.    Extraocular Movements: Extraocular movements intact.  Cardiovascular:     Rate and Rhythm: Normal rate and regular rhythm.     Pulses: Normal pulses.     Heart sounds: Normal heart sounds. No murmur heard.   Pulmonary:     Breath sounds: Normal breath sounds. No wheezing or rales.  Abdominal:     Palpations: Abdomen is soft.     Tenderness: There is no abdominal tenderness.  Musculoskeletal:     Cervical back: Neck supple. No tenderness.     Right lower leg: No edema.     Left lower leg: No edema.  Skin:    General: Skin is warm.     Findings: No rash.  Neurological:     General: No focal deficit present.     Mental Status: She is alert and oriented to person, place, and time.     Sensory: No sensory deficit.     Motor: No weakness.  Psychiatric:        Mood and Affect: Mood normal.        Behavior: Behavior normal.     Assessment & Plan:   Problem List Items Addressed This Visit      Encounter to establish care    -  Primary  Care established Previous chart reviewed History and medications reviewed with the patient   Cardiovascular and Mediastinum   Essential hypertension, benign    . BP Readings from Last 1 Encounters:  01/08/21 130/73   Well-controlled with Irbesartan 75 mg QD Counseled for compliance with the medications Advised DASH diet and moderate exercise/walking as tolerated        Respiratory   Asthma    Well-controlled with Symbicort and PRN Albuterol        Digestive   GERD (gastroesophageal reflux disease)    On Dexilant  Nonalcoholic steatohepatitis (NASH)    Follows up with GI      Chronic posterior anal fissure    S/p hemorrhoidectomy Has had General surgery evaluation, was advised for reconstructive surgery - patient prefers to avoid.        Endocrine   Hypothyroidism    On Levothyroxine Follows up with Dr Dorris Fetch        Musculoskeletal and  Integument   Generalized OA    Takes Tylenol PRN Norco only for severe pain        Other   Anxiety    H/o panic disorder as well Xanax 1 mg BID PRN, takes it only for panic episodes      Chronic insomnia    Takes Ambien 10 mg qHS PRN      Chronic back pain    Tylenol and Norco PRN Refills only 10 tablets of Norco      Atypical chest pain    Intermittent episodes Could be related to anxiety/panic episodes But nitro makes pain better, would refer to Cardiology for possible stress test      Relevant Orders   Ambulatory referral to Cardiology    Outpatient Encounter Medications as of 01/08/2021  Medication Sig  . albuterol (PROVENTIL) (2.5 MG/3ML) 0.083% nebulizer solution Take 3 mLs (2.5 mg total) by nebulization every 4 (four) hours as needed for wheezing or shortness of breath.  Marland Kitchen albuterol (VENTOLIN HFA) 108 (90 Base) MCG/ACT inhaler Inhale 2 puffs into the lungs every 4 (four) hours as needed for wheezing.  Marland Kitchen ALPRAZolam (XANAX) 1 MG tablet Take 1 tablet (1 mg total) by mouth 2 (two) times daily as needed for anxiety.  . brimonidine (ALPHAGAN) 0.2 % ophthalmic solution Place 1 drop into both eyes 2 (two) times daily.  . budesonide-formoterol (SYMBICORT) 160-4.5 MCG/ACT inhaler Inhale 2 puffs into the lungs as needed.  . chlorpheniramine-HYDROcodone (TUSSIONEX PENNKINETIC ER) 10-8 MG/5ML SUER Take 5 mLs by mouth every 12 (twelve) hours as needed.  . cyanocobalamin 1000 MCG tablet Take 1,000 mcg by mouth daily.  . cyclobenzaprine (FLEXERIL) 10 MG tablet TAKE ONE TABLET BY MOUTH DAILY  as needed muscle spasms  . dexlansoprazole (DEXILANT) 60 MG capsule Take 1 capsule (60 mg total) by mouth daily.  Marland Kitchen dicyclomine (BENTYL) 10 MG capsule Take one capsule before meals and at bedtime AS NEEDED for abdominal pain and frequent stool. HOLD for constipation  . Ergocalciferol (VITAMIN D2) 50 MCG (2000 UT) TABS Take 2,000 Units by mouth daily.  Marland Kitchen estradiol (ESTRACE VAGINAL) 0.1 MG/GM  vaginal cream Place 1 Applicatorful vaginally daily as needed.  . fenofibrate (TRICOR) 145 MG tablet Take 1 tablet (145 mg total) by mouth daily.  . folic acid (FOLVITE) 1 MG tablet Take 1 mg by mouth daily.  Marland Kitchen HYDROcodone-acetaminophen (NORCO/VICODIN) 5-325 MG tablet Take 1 tablet by mouth every 6 (six) hours as needed for moderate pain.  . hyoscyamine (ANASPAZ) 0.125 MG TBDP disintergrating tablet Place 0.125 mg under the tongue as needed.   . irbesartan (AVAPRO) 150 MG tablet Take 0.5 tablets (75 mg total) by mouth daily.  Marland Kitchen latanoprost (XALATAN) 0.005 % ophthalmic solution Place 1 drop into both eyes at bedtime.  Marland Kitchen levothyroxine (SYNTHROID) 25 MCG tablet Take 1 tablet (25 mcg total) by mouth daily before breakfast.  . Lidocaine-Hydrocortisone Ace 3-2.5 % KIT APPLY TO RECTUM QID for 2 weeks then AS NEEDED FOR RECTAL PAIN OR BLEEDING  . Lifitegrast (XIIDRA) 5 % SOLN INSTILL 1 DROP IN BOTH EYES EVERY DAY  AS NEEDED (EACH CONTAINER SHOULD YIELD 2 DROPS) STORE IN ORIGINAL FOIL POUCH  . magnesium oxide (HM MAGNESIUM) 400 MG tablet Take 2 tablets (800 mg total) by mouth daily. (Patient taking differently: Take 400 mg by mouth daily. Takes 1 tablet daily.)  . meclizine (ANTIVERT) 12.5 MG tablet Take 1 tablet (12.5 mg total) by mouth 3 (three) times daily as needed for dizziness.  . Menthol, Topical Analgesic, (BIOFREEZE EX) Apply topically daily as needed (pain).   . metroNIDAZOLE (METROCREAM) 0.75 % cream Apply 1 application topically as needed.  . naproxen (NAPROSYN) 500 MG tablet Take 500 mg by mouth as needed.  . neomycin-polymyxin b-dexamethasone (MAXITROL) 3.5-10000-0.1 OINT Place 1 application into both eyes at bedtime as needed.  . nitroGLYCERIN (NITROLINGUAL) 0.4 MG/SPRAY spray Place 1 spray under the tongue every 5 (five) minutes as needed for chest pain.  . NON FORMULARY Take 1 tablet by mouth daily as needed (bloat). FD GUARD  . ondansetron (ZOFRAN) 4 MG tablet Take 1 tablet (4 mg total) by  mouth every 8 (eight) hours as needed for nausea or vomiting.  . Probiotic Product (PROBIOTIC DAILY PO) Take 1 capsule by mouth daily.  Marland Kitchen senna (SENOKOT) 8.6 MG TABS tablet Take 1 tablet by mouth daily as needed.   Marland Kitchen UNABLE TO FIND Med Name: lidocaine 5% pramoxine 1% ointment per rectum as needed.  . zolpidem (AMBIEN) 10 MG tablet TAKE ONE TABLET BY MOUTH DAILY AT BEDTIME.  . [DISCONTINUED] promethazine (PHENERGAN) injection 25 mg    No facility-administered encounter medications on file as of 01/08/2021.    Follow-up: Return in about 4 months (around 05/10/2021) for HTN and asthma.   Lindell Spar, MD

## 2021-01-08 NOTE — Assessment & Plan Note (Signed)
Takes Ambien 10 mg qHS PRN

## 2021-01-08 NOTE — Assessment & Plan Note (Signed)
Tylenol and Norco PRN Refills only 10 tablets of Norco

## 2021-01-08 NOTE — Assessment & Plan Note (Signed)
Well-controlled with Symbicort and PRN Albuterol

## 2021-01-08 NOTE — Assessment & Plan Note (Signed)
.   BP Readings from Last 1 Encounters:  01/08/21 130/73   Well-controlled with Irbesartan 75 mg QD Counseled for compliance with the medications Advised DASH diet and moderate exercise/walking as tolerated

## 2021-01-08 NOTE — Patient Instructions (Signed)
Please continue taking medications as prescribed.  Please continue to follow low salt diet and ambulate as tolerated.  You are being referred to Cardiology for evaluation of chest pain episodes.

## 2021-01-08 NOTE — Assessment & Plan Note (Signed)
S/p hemorrhoidectomy Has had General surgery evaluation, was advised for reconstructive surgery - patient prefers to avoid.

## 2021-01-09 NOTE — Progress Notes (Signed)
Dear Rod Holler,   Your CT shows a benign cyst in the kidney. No evidence of cancer or other concerning findings. CT shows a fatty liver as you know.   If you have any questions or concerns, please don't hesitate to call us @ [phone]. Your next upcoming appointment is Feb 18, 2021 @ 3:00 pm.    Thank you, Oleh Genin, CMA

## 2021-01-10 ENCOUNTER — Encounter: Payer: Self-pay | Admitting: *Deleted

## 2021-01-21 DIAGNOSIS — H401112 Primary open-angle glaucoma, right eye, moderate stage: Secondary | ICD-10-CM | POA: Diagnosis not present

## 2021-01-21 DIAGNOSIS — H2511 Age-related nuclear cataract, right eye: Secondary | ICD-10-CM | POA: Diagnosis not present

## 2021-01-27 DIAGNOSIS — Z20822 Contact with and (suspected) exposure to covid-19: Secondary | ICD-10-CM | POA: Diagnosis not present

## 2021-01-29 ENCOUNTER — Encounter: Payer: Self-pay | Admitting: Cardiology
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
## 2021-01-30 ENCOUNTER — Encounter: Payer: Self-pay | Admitting: Cardiology

## 2021-01-30 ENCOUNTER — Other Ambulatory Visit: Payer: Self-pay

## 2021-01-30 ENCOUNTER — Ambulatory Visit (INDEPENDENT_AMBULATORY_CARE_PROVIDER_SITE_OTHER): Payer: Medicare Other | Admitting: Cardiology

## 2021-01-30 VITALS — BP 126/76 | HR 82 | Ht 67.0 in | Wt 211.0 lb

## 2021-01-30 DIAGNOSIS — Z789 Other specified health status: Secondary | ICD-10-CM

## 2021-01-30 DIAGNOSIS — R9431 Abnormal electrocardiogram [ECG] [EKG]: Secondary | ICD-10-CM | POA: Diagnosis not present

## 2021-01-30 DIAGNOSIS — R079 Chest pain, unspecified: Secondary | ICD-10-CM

## 2021-01-30 DIAGNOSIS — E782 Mixed hyperlipidemia: Secondary | ICD-10-CM

## 2021-01-30 DIAGNOSIS — I10 Essential (primary) hypertension: Secondary | ICD-10-CM | POA: Diagnosis not present

## 2021-01-30 NOTE — Patient Instructions (Signed)
Medication Instructions:  Your physician recommends that you continue on your current medications as directed. Please refer to the Current Medication list given to you today.  *If you need a refill on your cardiac medications before your next appointment, please call your pharmacy*   Lab Work: None today If you have labs (blood work) drawn today and your tests are completely normal, you will receive your results only by: Marland Kitchen MyChart Message (if you have MyChart) OR . A paper copy in the mail If you have any lab test that is abnormal or we need to change your treatment, we will call you to review the results.   Testing/Procedures: Your physician has requested that you have a lexiscan myoview. For further information please visit [email or web address]. Please follow instruction sheet, as given.     Follow-Up: At [facility], you and your health needs are our priority.  As part of our continuing mission to provide you with exceptional heart care, we have created designated Provider Care Teams.  These Care Teams include your primary Cardiologist (physician) and Advanced Practice Providers (APPs -  Physician Assistants and Nurse Practitioners) who all work together to provide you with the care you need, when you need it.  We recommend signing up for the patient portal called "MyChart".  Sign up information is provided on this After Visit Summary.  MyChart is used to connect with patients for Virtual Visits (Telemedicine).  Patients are able to view lab/test results, encounter notes, upcoming appointments, etc.  Non-urgent messages can be sent to your provider as well.   To learn more about what you can do with MyChart, go to [email or web address].    Your next appointment:  We will call you with results.

## 2021-01-31 DIAGNOSIS — Z23 Encounter for immunization: Secondary | ICD-10-CM | POA: Diagnosis not present

## 2021-02-04 ENCOUNTER — Encounter (HOSPITAL_COMMUNITY)
Admission: RE | Admit: 2021-02-04 | Discharge: 2021-02-04 | Disposition: A | Discharge status: Home / Self Care | Payer: Medicare Other | Source: Ambulatory Visit | Attending: Cardiology | Admitting: Cardiology

## 2021-02-04 ENCOUNTER — Encounter (HOSPITAL_BASED_OUTPATIENT_CLINIC_OR_DEPARTMENT_OTHER)
Admission: RE | Admit: 2021-02-04 | Discharge: 2021-02-04 | Disposition: A | Discharge status: Home / Self Care | Payer: Medicare Other | Source: Ambulatory Visit | Attending: Cardiology | Admitting: Cardiology

## 2021-02-04 DIAGNOSIS — R079 Chest pain, unspecified: Secondary | ICD-10-CM

## 2021-02-04 DIAGNOSIS — R9431 Abnormal electrocardiogram [ECG] [EKG]: Secondary | ICD-10-CM | POA: Diagnosis not present

## 2021-02-04 MED ORDER — REGADENOSON 0.4 MG/5ML IV SOLN
INTRAVENOUS | Status: AC
  Administered 2021-02-04: 0.4 mg via INTRAVENOUS
  Filled 2021-02-04: qty 5

## 2021-02-04 MED ORDER — SODIUM CHLORIDE FLUSH 0.9 % IV SOLN
INTRAVENOUS | Status: AC
  Administered 2021-02-04: 10 mL via INTRAVENOUS
  Filled 2021-02-04: qty 10

## 2021-02-04 MED ORDER — TECHNETIUM TC 99M TETROFOSMIN IV KIT
10.0000 | PACK | Freq: Once | INTRAVENOUS | Status: AC | PRN
  Administered 2021-02-04: 9.5 via INTRAVENOUS

## 2021-02-04 MED ORDER — TECHNETIUM TC 99M TETROFOSMIN IV KIT
30.0000 | PACK | Freq: Once | INTRAVENOUS | Status: AC | PRN
  Administered 2021-02-04: 28 via INTRAVENOUS

## 2021-02-05 ENCOUNTER — Telehealth: Payer: Self-pay

## 2021-02-05 ENCOUNTER — Other Ambulatory Visit: Payer: Self-pay | Admitting: Internal Medicine

## 2021-02-05 DIAGNOSIS — F5104 Psychophysiologic insomnia: Secondary | ICD-10-CM

## 2021-02-05 LAB — NM MYOCAR MULTI W/SPECT W/WALL MOTION / EF
LV dias vol: 52 mL (ref 46–106)
LV sys vol: 9 mL
Peak HR: 96 {beats}/min
RATE: 0.47
Rest HR: 70 {beats}/min
SDS: 0
SRS: 0
SSS: 0
TID: 1.06

## 2021-02-05 MED ORDER — ZOLPIDEM TARTRATE 10 MG PO TABS: ORAL_TABLET | ORAL | 3 refills | Status: DC

## 2021-02-05 NOTE — Telephone Encounter (Signed)
Please send to Zolpidem to the [facility] should go thru the [facility] with a 90 days supply  --Unless its an Emergency that she needs now.

## 2021-02-05 NOTE — Telephone Encounter (Signed)
Pt notified and verbalized understanding.

## 2021-02-05 NOTE — Telephone Encounter (Signed)
-----   Message from Satira Sark, MD sent at 02/05/2021  2:56 PM EDT ----- Results reviewed.  Please let her know that the stress test was low risk and reassuring.  No clear evidence of obstructive CAD as cause of her symptoms.  Would continue with medical therapy and follow-up with PCP.

## 2021-02-17 DIAGNOSIS — H2511 Age-related nuclear cataract, right eye: Secondary | ICD-10-CM | POA: Diagnosis not present

## 2021-02-18 ENCOUNTER — Encounter: Payer: Self-pay | Admitting: Gastroenterology

## 2021-02-18 ENCOUNTER — Ambulatory Visit (INDEPENDENT_AMBULATORY_CARE_PROVIDER_SITE_OTHER): Payer: Medicare Other | Admitting: Gastroenterology

## 2021-02-18 ENCOUNTER — Other Ambulatory Visit: Payer: Self-pay

## 2021-02-18 VITALS — BP 132/73 | HR 101 | Temp 97.3°F | Ht 67.0 in | Wt 206.4 lb

## 2021-02-18 DIAGNOSIS — Z800 Family history of malignant neoplasm of digestive organs: Secondary | ICD-10-CM | POA: Diagnosis not present

## 2021-02-18 DIAGNOSIS — K5900 Constipation, unspecified: Secondary | ICD-10-CM | POA: Diagnosis not present

## 2021-02-18 NOTE — Patient Instructions (Signed)
Let's try one teaspoon of Benefiber daily. If tolerated, you can increase this.  We are arranging a colonoscopy with Dr. Abbey Chatters in the near future!  Further recommendations to follow!  I enjoyed seeing you again today! As you know, I value our relationship and want to provide genuine, compassionate, and quality care. I welcome your feedback. If you receive a survey regarding your visit,  I greatly appreciate you taking time to fill this out. See you next time!  Annitta Needs, PhD, ANP-BC [facility]

## 2021-02-18 NOTE — Progress Notes (Signed)
Referring Provider: Lindell Spar, MD Primary Care Physician:  Lindell Spar, MD Primary GI: Dr. Abbey Chatters  Chief Complaint  Patient presents with  . Hemorrhoids    Bleeding. States she is due for TCS    HPI:   [name] is a 70 y.o. female presenting today with a history of IBS, GERD, NASH, chronic anal fissure and electing not to have repair. Noted RUQ pain in Sept 2021 but gallbladder absent. Mildly elevated transaminases chornically. CBD dilated noted on [place]. MRI/MRCP without CBD stones or mass. She had a lesion in right kidney, with repeat CT March 2022 noting benign lesion.   Colonoscopy due now. Notes intermittent rectal bleeding.   Chronic back pain. Feels like she has pressure on her back when she needs to have a BM. Has relief on back after BM. Not emptying well and not productive. Coming out in small portions. Will take Magnesium gels 800 mg and has good BMs. Was on Miralax in the past but this flushes her out.  Has trouble eating fresh veggies as it makes it harder. Stool is soft. Tries to adjust food to keep stool softer. Will take Senokot as needed. Doesn't get hungry. Will have RUQ Pain if constipated.   Sphincter tone affected after 12-14-2015 hemorrhoid surgery. Will have incontinent episodes with lifting.   Past Medical History:  Diagnosis Date  . Anxiety   . Arthritis   . Asthma   . Cataract    Phreesia 01/05/2021  . Chest pain    a. 02/2000 Cath: nl cors, EF 60%;  b. 10/2010 MV: nl LV, no ischemia/infarct;  c. 03/2014 Admit c/p, r/o->grief from husbands death.  . Chronic RUQ pain 12-14-07   EUS slightly dilated CBD (7.74m), otherwise nl  . Depression   . Eczema   . Exposure to chemical inhalation mid 1970s  . Fatty liver   . G6PD deficiency   . GERD (gastroesophageal reflux disease)   . Glaucoma   . History of pulmonary embolus (PE)    Treated with Eliquis 203-07-2015 . HTN (hypertension)   . Hypercholesteremia    a. intolerant to statins.  . IBS (irritable  bowel syndrome)   . Kidney stone   . Legally blind   . Palpitations   . Recurrent upper respiratory infection (URI)   . Renal cyst   . Sickle cell trait ([place]   . Tubular adenoma of colon 09/17/2011  . Urticaria     Past Surgical History:  Procedure Laterality Date  . ABDOMINAL HYSTERECTOMY    . ADENOIDECTOMY    . ANGIOPLASTY    . APPENDECTOMY    . BIOPSY N/A 03/14/2013   Procedure: SMALL BOWEL AND GASTRIC BIOPSIES (Procedure #1);  Surgeon: SDanie Binder MD;  Location: [facility];  Service: Endoscopy;  Laterality: N/A;  . CARDIAC CATHETERIZATION Left 02/11/2000  . CATARACT EXTRACTION Left   . CHOLECYSTECTOMY  12/2007  . COLONOSCOPY  12/22/10   RPRF:FMBWGY cecal adenomatous polyp  . COLONOSCOPY WITH PROPOFOL N/A 03/31/2016   Dr. FOneida Alar four sessile polyps rectum/sigmoid colon, diverticulosi, ext/int hemorrhoids. hyperplastic polyps, next tcs 5 years.  . complete hysterectomy    . ENTEROSCOPY N/A 03/14/2013   SKZL:DJTTgastritis/ulcers has healed  . ESOPHAGOGASTRODUODENOSCOPY  09/22/2011   SSVX:BLTJgastritis/Duodenitis  . FLEXIBLE SIGMOIDOSCOPY N/A 03/14/2013   SLF:3 colon polyp removed/moderate sized internal hemorrhoids  . FLEXIBLE SIGMOIDOSCOPY N/A 06/09/2016   Procedure: FLEXIBLE SIGMOIDOSCOPY;  Surgeon: SDanie Binder MD;  Location: [facility];  Service: Endoscopy;  Laterality: N/A;  rectal polyps times 2  . FLEXIBLE SIGMOIDOSCOPY N/A 03/18/2018   Procedure: FLEXIBLE SIGMOIDOSCOPY;  Surgeon: Danie Binder, MD;  Location: [facility];  Service: Endoscopy;  Laterality: N/A;  9:15am  . FOOT SURGERY     bunion removal left  . GIVENS CAPSULE STUDY N/A 02/10/2013   Procedure: GIVENS CAPSULE STUDY;  Surgeon: Danie Binder, MD;  Location: [facility];  Service: Endoscopy;  Laterality: N/A;  730  . HEEL SPUR EXCISION     right   . HEMORRHOID BANDING N/A 03/14/2013   Procedure: HEMORRHOID BANDING (Procedure #3)  3 bands applied [id number] Exp 02/02/2014 ;  Surgeon: Danie Binder,  MD;  Location: [facility];  Service: Endoscopy;  Laterality: N/A;  . HEMORRHOID SURGERY N/A 04/24/2016   Procedure: EXTENSIVE HEMORRHOIDECTOMY;  Surgeon: Vickie Epley, MD;  Location: [facility];  Service: General;  Laterality: N/A;  . LAPAROSCOPY     adhesions  . POLYPECTOMY N/A 03/14/2013   QVZ:DGLO Gastritis . ULCERS SEEN ON MAY 6 HAVE HEALED  . POLYPECTOMY  03/31/2016   Procedure: POLYPECTOMY;  Surgeon: Danie Binder, MD;  Location: [facility];  Service: Endoscopy;;  sigmoid colon polyps x2, rectal polyps x2  . TONSILLECTOMY    . TUBAL LIGATION      Current Outpatient Medications  Medication Sig Dispense Refill  . albuterol (PROVENTIL) (2.5 MG/3ML) 0.083% nebulizer solution Take 3 mLs (2.5 mg total) by nebulization every 4 (four) hours as needed for wheezing or shortness of breath. 150 mL 3  . albuterol (VENTOLIN HFA) 108 (90 Base) MCG/ACT inhaler Inhale 2 puffs into the lungs every 4 (four) hours as needed for wheezing. 54 g 3  . ALPRAZolam (XANAX) 1 MG tablet Take 1 tablet (1 mg total) by mouth 2 (two) times daily as needed for anxiety. 180 tablet 3  . brimonidine (ALPHAGAN) 0.2 % ophthalmic solution Place 1 drop into both eyes 2 (two) times daily. 15 mL 3  . budesonide-formoterol (SYMBICORT) 160-4.5 MCG/ACT inhaler Inhale 2 puffs into the lungs as needed. 10.2 g 3  . chlorpheniramine-HYDROcodone (TUSSIONEX PENNKINETIC ER) 10-8 MG/5ML SUER Take 5 mLs by mouth every 12 (twelve) hours as needed. 140 mL 0  . cyanocobalamin 1000 MCG tablet Take 1,000 mcg by mouth daily.    . cyclobenzaprine (FLEXERIL) 10 MG tablet TAKE ONE TABLET BY MOUTH DAILY  as needed muscle spasms 90 tablet 3  . dexlansoprazole (DEXILANT) 60 MG capsule Take 1 capsule (60 mg total) by mouth daily. 90 capsule 3  . dicyclomine (BENTYL) 10 MG capsule Take one capsule before meals and at bedtime AS NEEDED for abdominal pain and frequent stool. HOLD for constipation 120 capsule 1  . Ergocalciferol (VITAMIN D2) 50 MCG (2000 UT)  TABS Take 2,000 Units by mouth daily. 90 tablet 3  . estradiol (ESTRACE VAGINAL) 0.1 MG/GM vaginal cream Place 1 Applicatorful vaginally daily as needed. 42.5 g 3  . fenofibrate (TRICOR) 145 MG tablet Take 1 tablet (145 mg total) by mouth daily. 90 tablet 3  . folic acid (FOLVITE) 1 MG tablet Take 1 mg by mouth daily.    Marland Kitchen HYDROcodone-acetaminophen (NORCO/VICODIN) 5-325 MG tablet Take 1 tablet by mouth every 6 (six) hours as needed for moderate pain. 10 tablet 0  . hyoscyamine (ANASPAZ) 0.125 MG TBDP disintergrating tablet Place 0.125 mg under the tongue as needed.     . irbesartan (AVAPRO) 150 MG tablet Take 0.5 tablets (75 mg total) by mouth  daily. 45 tablet 3  . latanoprost (XALATAN) 0.005 % ophthalmic solution Place 1 drop into both eyes at bedtime. 9 mL 3  . levothyroxine (SYNTHROID) 25 MCG tablet Take 1 tablet (25 mcg total) by mouth daily before breakfast. 90 tablet 1  . Lidocaine-Hydrocortisone Ace 3-2.5 % KIT APPLY TO RECTUM QID for 2 weeks then AS NEEDED FOR RECTAL PAIN OR BLEEDING 1 kit 11  . Lifitegrast (XIIDRA) 5 % SOLN INSTILL 1 DROP IN BOTH EYES EVERY DAY AS NEEDED (EACH CONTAINER SHOULD YIELD 2 DROPS) STORE IN ORIGINAL FOIL POUCH 60 each 3  . magnesium oxide (HM MAGNESIUM) 400 MG tablet Take 2 tablets (800 mg total) by mouth daily. (Patient taking differently: Take 400 mg by mouth daily. Takes 1 tablet daily.)    . meclizine (ANTIVERT) 12.5 MG tablet Take 1 tablet (12.5 mg total) by mouth 3 (three) times daily as needed for dizziness. 30 tablet 3  . Menthol, Topical Analgesic, (BIOFREEZE EX) Apply topically daily as needed (pain).     . metroNIDAZOLE (METROCREAM) 0.75 % cream Apply 1 application topically as needed. 45 g 3  . naproxen (NAPROSYN) 500 MG tablet Take 500 mg by mouth as needed.    . neomycin-polymyxin b-dexamethasone (MAXITROL) 3.5-10000-0.1 OINT Place 1 application into both eyes at bedtime as needed. 10.5 g 3  . nitroGLYCERIN (NITROLINGUAL) 0.4 MG/SPRAY spray Place 1  spray under the tongue every 5 (five) minutes as needed for chest pain. 12 g 3  . NON FORMULARY Take 1 tablet by mouth daily as needed (bloat). FD GUARD    . ondansetron (ZOFRAN) 4 MG tablet Take 1 tablet (4 mg total) by mouth every 8 (eight) hours as needed for nausea or vomiting. 30 tablet 1  . Probiotic Product (PROBIOTIC DAILY PO) Take 1 capsule by mouth daily.    Marland Kitchen senna (SENOKOT) 8.6 MG TABS tablet Take 1 tablet by mouth daily as needed.     Marland Kitchen UNABLE TO FIND Med Name: lidocaine 5% pramoxine 1% ointment per rectum as needed.    . zolpidem (AMBIEN) 10 MG tablet TAKE ONE TABLET BY MOUTH DAILY AT BEDTIME. 90 tablet 3  . nitrofurantoin, macrocrystal-monohydrate, (MACROBID) 100 MG capsule Take 1 capsule (100 mg total) by mouth 2 (two) times daily. 10 capsule 0   No current facility-administered medications for this visit.    Allergies as of 02/18/2021 - Review Complete 02/18/2021  Allergen Reaction Noted  . Adhesive [tape] Other (See Comments) 03/10/2013  . Aspirin Other (See Comments) 09/18/2011  . Keflex [cephalexin] Hives 11/26/2016  . Levaquin [levofloxacin in d5w]  12/29/2016  . Other  10/08/2017  . Statins Other (See Comments) 02/28/2013  . Sulfonamide derivatives Other (See Comments) 12/10/2010  . Latex Rash 12/13/2011    Family History  Problem Relation Age of Onset  . Colon cancer Mother        23s  . Urticaria Mother   . Hypertension Mother   . Cancer Mother   . Cancer Father        oral  . Crohn's disease Sister   . Colon cancer Maternal Grandfather   . Colon cancer Paternal Grandfather   . Diabetes Brother   . Colon cancer Maternal Grandmother   . Anesthesia problems Neg Hx     Social History   Socioeconomic History  . Marital status: Widowed    Spouse name: Not on file  . Number of children: Not on file  . Years of education: Not on file  . Highest  education level: Not on file  Occupational History  . Occupation: homemaker    Employer: UNEMPLOYED   Tobacco Use  . Smoking status: Former Smoker    Packs/day: 0.50    Years: 30.00    Pack years: 15.00    Types: Cigarettes  . Smokeless tobacco: Former Systems developer    Quit date: 10/15/1996  . Tobacco comment: Stopped smoking ~ 2000  Vaping Use  . Vaping Use: Never used  Substance and Sexual Activity  . Alcohol use: Yes    Comment: occassion  . Drug use: No  . Sexual activity: Yes    Birth control/protection: Surgical  Other Topics Concern  . Not on file  Social History Narrative   Does not routinely exercise. Husband passed in JUN 2015 due to prostate ca.   Social Determinants of Health   Financial Resource Strain: Not on file  Food Insecurity: Not on file  Transportation Needs: Not on file  Physical Activity: Not on file  Stress: Not on file  Social Connections: Not on file    Review of Systems: Gen: Denies fever, chills, anorexia. Denies fatigue, weakness, weight loss.  CV: Denies chest pain, palpitations, syncope, peripheral edema, and claudication. Resp: Denies dyspnea at rest, cough, wheezing, coughing up blood, and pleurisy. GI: see HPI Derm: Denies rash, itching, dry skin Psych: Denies depression, anxiety, memory loss, confusion. No homicidal or suicidal ideation.  Heme: see HPI  Physical Exam: BP 132/73   Pulse (!) 101   Temp (!) 97.3 F (36.3 C) (Temporal)   Ht _0  (1.702 m)   Wt 206 lb 6.4 oz (93.6 kg)   BMI 32.33 kg/m  General:   Alert and oriented. No distress noted. Pleasant and cooperative.  Head:  Normocephalic and atraumatic. Eyes:  Conjuctiva clear without scleral icterus. Mouth:  Mask inp lace Abdomen:  +BS, soft, mildly TTP upper abdomen and non-distended. No rebound or guarding. No HSM or masses noted. Msk:  Symmetrical without gross deformities. Normal posture. Extremities:  Without edema. Neurologic:  Alert and  oriented x4 Psych:  Alert and cooperative. Normal mood and affect.  ASSESSMENT: AFREEN SIEBELS is a 70 y.o. female presenting  today with a history of IBS, GERD, NASH, chronic anal fissure and electing not to have repair. Last colonoscopy in 2017 with hyperplastic polyps but has personal history of adenomas and also mother with history of colon cancer and multiple non-first degree relatives with colon cancer.   Constipation: with intermittent incontinence, which has been present since 2017 hemorrhoid surgery. She is quite concerned about colonoscopy and passage of scope, concerned that it may be too large. She did have a flex sig in 2019 by Dr. Oneida Alar. Will start trial of Benefiber daily. She is to call with update. Continue stool softener as needed. Difficult to manage historically.   GERD: Continue with Dexilant daily.     PLAN:  Proceed with colonoscopy by Dr. Abbey Chatters  in near future: the risks, benefits, and alternatives have been discussed with the patient in detail. The patient states understanding and desires to proceed.   Add Benefiber daily and increase as tolerated  Stool softener titrated as needed  Dexilant daily  Further recommendations to follow   Annitta Needs, PhD, ANP-BC [facility]

## 2021-02-19 ENCOUNTER — Encounter: Payer: Self-pay | Admitting: Internal Medicine

## 2021-02-19 ENCOUNTER — Telehealth (INDEPENDENT_AMBULATORY_CARE_PROVIDER_SITE_OTHER): Payer: Medicare Other | Admitting: Internal Medicine

## 2021-02-19 ENCOUNTER — Telehealth: Payer: Self-pay | Admitting: *Deleted

## 2021-02-19 DIAGNOSIS — N390 Urinary tract infection, site not specified: Secondary | ICD-10-CM

## 2021-02-19 DIAGNOSIS — M549 Dorsalgia, unspecified: Secondary | ICD-10-CM | POA: Diagnosis not present

## 2021-02-19 LAB — POCT URINALYSIS DIP (CLINITEK)
Bilirubin, UA: NEGATIVE
Blood, UA: NEGATIVE
Glucose, UA: NEGATIVE mg/dL
Ketones, POC UA: NEGATIVE mg/dL
Nitrite, UA: NEGATIVE
POC PROTEIN,UA: NEGATIVE
Spec Grav, UA: 1.015 (ref 1.010–1.025)
Urobilinogen, UA: 0.2 E.U./dL
pH, UA: 5.5 (ref 5.0–8.0)

## 2021-02-19 MED ORDER — NITROFURANTOIN MONOHYD MACRO 100 MG PO CAPS: 100.0000 mg | ORAL_CAPSULE | Freq: Two times a day (BID) | ORAL | 0 refills | Status: DC

## 2021-02-19 NOTE — Patient Instructions (Signed)
Urinary Tract Infection, Adult A urinary tract infection (UTI) is an infection of any part of the urinary tract. The urinary tract includes:  The kidneys.  The ureters.  The bladder.  The urethra. These organs make, store, and get rid of pee (urine) in the body. What are the causes? This infection is caused by germs (bacteria) in your genital area. These germs grow and cause swelling (inflammation) of your urinary tract. What increases the risk? The following factors may make you more likely to develop this condition:  Using a small, thin tube (catheter) to drain pee.  Not being able to control when you pee or poop (incontinence).  Being female. If you are female, these things can increase the risk: ? Using these methods to prevent pregnancy:  A medicine that kills sperm (spermicide).  A device that blocks sperm (diaphragm). ? Having low levels of a female hormone (estrogen). ? Being pregnant. You are more likely to develop this condition if:  You have genes that add to your risk.  You are sexually active.  You take antibiotic medicines.  You have trouble peeing because of: ? A prostate that is bigger than normal, if you are female. ? A blockage in the part of your body that drains pee from the bladder. ? A kidney stone. ? A nerve condition that affects your bladder. ? Not getting enough to drink. ? Not peeing often enough.  You have other conditions, such as: ? Diabetes. ? A weak disease-fighting system (immune system). ? Sickle cell disease. ? Gout. ? Injury of the spine. What are the signs or symptoms? Symptoms of this condition include:  Needing to pee right away.  Peeing small amounts often.  Pain or burning when peeing.  Blood in the pee.  Pee that smells bad or not like normal.  Trouble peeing.  Pee that is cloudy.  Fluid coming from the vagina, if you are female.  Pain in the belly or lower back. Other symptoms include:  Vomiting.  Not  feeling hungry.  Feeling mixed up (confused). This may be the first symptom in older adults.  Being tired and grouchy (irritable).  A fever.  Watery poop (diarrhea). How is this treated?  Taking antibiotic medicine.  Taking other medicines.  Drinking enough water. In some cases, you may need to see a specialist. Follow these instructions at home: Medicines  Take over-the-counter and prescription medicines only as told by your doctor.  If you were prescribed an antibiotic medicine, take it as told by your doctor. Do not stop taking it even if you start to feel better. General instructions  Make sure you: ? Pee until your bladder is empty. ? Do not hold pee for a long time. ? Empty your bladder after sex. ? Wipe from front to back after peeing or pooping if you are a female. Use each tissue one time when you wipe.  Drink enough fluid to keep your pee pale yellow.  Keep all follow-up visits.   Contact a doctor if:  You do not get better after 1-2 days.  Your symptoms go away and then come back. Get help right away if:  You have very bad back pain.  You have very bad pain in your lower belly.  You have a fever.  You have chills.  You feeling like you will vomit or you vomit. Summary  A urinary tract infection (UTI) is an infection of any part of the urinary tract.  This condition is caused by   germs in your genital area.  There are many risk factors for a UTI.  Treatment includes antibiotic medicines.  Drink enough fluid to keep your pee pale yellow. This information is not intended to replace advice given to you by your health care provider. Make sure you discuss any questions you have with your health care provider. Document Revised: 05/03/2020 Document Reviewed: 05/03/2020 Elsevier Patient Education  2021 [employer]  

## 2021-02-19 NOTE — Telephone Encounter (Signed)
Received VM from pt. She was checking to see if we have July scheduled  Called pt. Advised we do not have July schedule and we will call her once we do. She voiced understanding

## 2021-02-19 NOTE — Progress Notes (Signed)
Virtual Visit via Telephone Note   This visit type was conducted due to national recommendations for restrictions regarding the COVID-19 Pandemic (e.g. social distancing) in an effort to limit this patient's exposure and mitigate transmission in our community.  Due to her co-morbid illnesses, this patient is at least at moderate risk for complications without adequate follow up.  This format is felt to be most appropriate for this patient at this time.  The patient did not have access to video technology/had technical difficulties with video requiring transitioning to audio format only (telephone).  All issues noted in this document were discussed and addressed.  No physical exam could be performed with this format.  Evaluation Performed:  Follow-up visit  Date:  02/19/2021   ID:  [name], DOB 07-Oct-1950, MRN [id number]  Patient Location: Home Provider Location: Office/Clinic  Participants: Patient Location of Patient: Home Location of Provider: Telehealth Consent was obtain for visit to be over via telehealth. I verified that I am speaking with the correct person using two identifiers.  PCP:  Lindell Spar, MD   Chief Complaint:  Urinary frequency and flank pain  History of Present Illness:    [name] is a 70 y.o. female who has a televisit for c/o urinary frequency and left sided flank pain. She states that urinary frequency is present for last 1 week, but denies any hematuria or dysuria. She has lower abdominal discomfort as well. Her back pain is chronic from previous accident, and she is unable to differentiate the pain from it. Denies any fever, chills, nausea, or vomiting.  The patient does not have symptoms concerning for COVID-19 infection (fever, chills, cough, or new shortness of breath).   Past Medical, Surgical, Social History, Allergies, and Medications have been Reviewed.  Past Medical History:  Diagnosis Date  . Anxiety   . Arthritis   .  Asthma   . Cataract    Phreesia 01/05/2021  . Chest pain    a. 02/2000 Cath: nl cors, EF 60%;  b. 10/2010 MV: nl LV, no ischemia/infarct;  c. 03/2014 Admit c/p, r/o->grief from husbands death.  . Chronic RUQ pain 12/02/07   EUS slightly dilated CBD (7.29m), otherwise nl  . Depression   . Eczema   . Exposure to chemical inhalation mid 1970s  . Fatty liver   . G6PD deficiency   . GERD (gastroesophageal reflux disease)   . Glaucoma   . History of pulmonary embolus (PE)    Treated with Eliquis 22016/02/27 . HTN (hypertension)   . Hypercholesteremia    a. intolerant to statins.  . IBS (irritable bowel syndrome)   . Kidney stone   . Legally blind   . Palpitations   . Recurrent upper respiratory infection (URI)   . Renal cyst   . Sickle cell trait ([place]   . Tubular adenoma of colon 09/17/2011  . Urticaria    Past Surgical History:  Procedure Laterality Date  . ABDOMINAL HYSTERECTOMY    . ADENOIDECTOMY    . ANGIOPLASTY    . APPENDECTOMY    . BIOPSY N/A 03/14/2013   Procedure: SMALL BOWEL AND GASTRIC BIOPSIES (Procedure #1);  Surgeon: SDanie Binder MD;  Location: [facility];  Service: Endoscopy;  Laterality: N/A;  . CARDIAC CATHETERIZATION Left 02/11/2000  . CATARACT EXTRACTION Left   . CHOLECYSTECTOMY  12/2007  . COLONOSCOPY  12/22/10   RWJX:BJYNWG cecal adenomatous polyp  . COLONOSCOPY WITH PROPOFOL N/A 03/31/2016   Dr. FOneida Alar  four sessile polyps rectum/sigmoid colon, diverticulosi, ext/int hemorrhoids. hyperplastic polyps, next tcs 5 years.  . complete hysterectomy    . ENTEROSCOPY N/A 03/14/2013   BHA:LPFX gastritis/ulcers has healed  . ESOPHAGOGASTRODUODENOSCOPY  09/22/2011   TKW:IOXB gastritis/Duodenitis  . FLEXIBLE SIGMOIDOSCOPY N/A 03/14/2013   SLF:3 colon polyp removed/moderate sized internal hemorrhoids  . FLEXIBLE SIGMOIDOSCOPY N/A 06/09/2016   Procedure: FLEXIBLE SIGMOIDOSCOPY;  Surgeon: Danie Binder, MD;  [facility];  Service: Endoscopy;  Laterality: N/A;  rectal  polyps times 2  . FLEXIBLE SIGMOIDOSCOPY N/A 03/18/2018   Procedure: FLEXIBLE SIGMOIDOSCOPY;  Surgeon: Danie Binder, MD;  [facility];  Service: Endoscopy;  Laterality: N/A;  9:15am  . FOOT SURGERY     bunion removal left  . GIVENS CAPSULE STUDY N/A 02/10/2013   Procedure: GIVENS CAPSULE STUDY;  Surgeon: Danie Binder, MD;  [facility];  Service: Endoscopy;  Laterality: N/A;  730  . HEEL SPUR EXCISION     right   . HEMORRHOID BANDING N/A 03/14/2013   Procedure: HEMORRHOID BANDING (Procedure #3)  3 bands applied [id number] Exp 02/02/2014 ;  Surgeon: Danie Binder, MD;  Location: [facility];  Service: Endoscopy;  Laterality: N/A;  . HEMORRHOID SURGERY N/A 04/24/2016   Procedure: EXTENSIVE HEMORRHOIDECTOMY;  Surgeon: Vickie Epley, MD;  Location: [facility];  Service: General;  Laterality: N/A;  . LAPAROSCOPY     adhesions  . POLYPECTOMY N/A 03/14/2013   TMH:DQQI Gastritis . ULCERS SEEN ON MAY 6 HAVE HEALED  . POLYPECTOMY  03/31/2016   Procedure: POLYPECTOMY;  Surgeon: Danie Binder, MD;  [facility];  Service: Endoscopy;;  sigmoid colon polyps x2, rectal polyps x2  . TONSILLECTOMY    . TUBAL LIGATION       Current Meds  Medication Sig  . albuterol (PROVENTIL) (2.5 MG/3ML) 0.083% nebulizer solution Take 3 mLs (2.5 mg total) by nebulization every 4 (four) hours as needed for wheezing or shortness of breath.  Marland Kitchen albuterol (VENTOLIN HFA) 108 (90 Base) MCG/ACT inhaler Inhale 2 puffs into the lungs every 4 (four) hours as needed for wheezing.  Marland Kitchen ALPRAZolam (XANAX) 1 MG tablet Take 1 tablet (1 mg total) by mouth 2 (two) times daily as needed for anxiety.  . brimonidine (ALPHAGAN) 0.2 % ophthalmic solution Place 1 drop into both eyes 2 (two) times daily.  . budesonide-formoterol (SYMBICORT) 160-4.5 MCG/ACT inhaler Inhale 2 puffs into the lungs as needed.  . chlorpheniramine-HYDROcodone (TUSSIONEX PENNKINETIC ER) 10-8 MG/5ML SUER Take 5 mLs by mouth every 12 (twelve)  hours as needed.  . cyanocobalamin 1000 MCG tablet Take 1,000 mcg by mouth daily.  . cyclobenzaprine (FLEXERIL) 10 MG tablet TAKE ONE TABLET BY MOUTH DAILY  as needed muscle spasms  . dexlansoprazole (DEXILANT) 60 MG capsule Take 1 capsule (60 mg total) by mouth daily.  Marland Kitchen dicyclomine (BENTYL) 10 MG capsule Take one capsule before meals and at bedtime AS NEEDED for abdominal pain and frequent stool. HOLD for constipation  . Ergocalciferol (VITAMIN D2) 50 MCG (2000 UT) TABS Take 2,000 Units by mouth daily.  Marland Kitchen estradiol (ESTRACE VAGINAL) 0.1 MG/GM vaginal cream Place 1 Applicatorful vaginally daily as needed.  . fenofibrate (TRICOR) 145 MG tablet Take 1 tablet (145 mg total) by mouth daily.  . folic acid (FOLVITE) 1 MG tablet Take 1 mg by mouth daily.  Marland Kitchen HYDROcodone-acetaminophen (NORCO/VICODIN) 5-325 MG tablet Take 1 tablet by mouth every 6 (six) hours as needed for moderate pain.  Marland Kitchen  hyoscyamine (ANASPAZ) 0.125 MG TBDP disintergrating tablet Place 0.125 mg under the tongue as needed.   . irbesartan (AVAPRO) 150 MG tablet Take 0.5 tablets (75 mg total) by mouth daily.  Marland Kitchen latanoprost (XALATAN) 0.005 % ophthalmic solution Place 1 drop into both eyes at bedtime.  Marland Kitchen levothyroxine (SYNTHROID) 25 MCG tablet Take 1 tablet (25 mcg total) by mouth daily before breakfast.  . Lidocaine-Hydrocortisone Ace 3-2.5 % KIT APPLY TO RECTUM QID for 2 weeks then AS NEEDED FOR RECTAL PAIN OR BLEEDING  . Lifitegrast (XIIDRA) 5 % SOLN INSTILL 1 DROP IN BOTH EYES EVERY DAY AS NEEDED (EACH CONTAINER SHOULD YIELD 2 DROPS) STORE IN ORIGINAL FOIL POUCH  . magnesium oxide (HM MAGNESIUM) 400 MG tablet Take 2 tablets (800 mg total) by mouth daily. (Patient taking differently: Take 400 mg by mouth daily. Takes 1 tablet daily.)  . meclizine (ANTIVERT) 12.5 MG tablet Take 1 tablet (12.5 mg total) by mouth 3 (three) times daily as needed for dizziness.  . Menthol, Topical Analgesic, (BIOFREEZE EX) Apply topically daily as needed (pain).    . metroNIDAZOLE (METROCREAM) 0.75 % cream Apply 1 application topically as needed.  . naproxen (NAPROSYN) 500 MG tablet Take 500 mg by mouth as needed.  . neomycin-polymyxin b-dexamethasone (MAXITROL) 3.5-10000-0.1 OINT Place 1 application into both eyes at bedtime as needed.  . nitrofurantoin, macrocrystal-monohydrate, (MACROBID) 100 MG capsule Take 1 capsule (100 mg total) by mouth 2 (two) times daily.  . nitroGLYCERIN (NITROLINGUAL) 0.4 MG/SPRAY spray Place 1 spray under the tongue every 5 (five) minutes as needed for chest pain.  . NON FORMULARY Take 1 tablet by mouth daily as needed (bloat). FD GUARD  . ondansetron (ZOFRAN) 4 MG tablet Take 1 tablet (4 mg total) by mouth every 8 (eight) hours as needed for nausea or vomiting.  . Probiotic Product (PROBIOTIC DAILY PO) Take 1 capsule by mouth daily.  Marland Kitchen senna (SENOKOT) 8.6 MG TABS tablet Take 1 tablet by mouth daily as needed.   Marland Kitchen UNABLE TO FIND Med Name: lidocaine 5% pramoxine 1% ointment per rectum as needed.  . zolpidem (AMBIEN) 10 MG tablet TAKE ONE TABLET BY MOUTH DAILY AT BEDTIME.     Allergies:   Adhesive [tape], Aspirin, Keflex [cephalexin], Levaquin [levofloxacin in d5w], Other, Statins, Sulfonamide derivatives, and Latex   ROS:   Please see the history of present illness.     All other systems reviewed and are negative.   Labs/Other Tests and Data Reviewed:    Recent Labs: 09/23/2020: Magnesium 1.5; TSH 1.770 12/11/2020: ALT 32; BUN 19; Creat 1.23; Hemoglobin 13.7; Platelets 264; Potassium 4.5; Sodium 137   Recent Lipid Panel Lab Results  Component Value Date/Time   CHOL 228 (H) 12/11/2020 10:23 AM   CHOL 254 07/12/2012 07:55 AM   TRIG 231 (H) 12/11/2020 10:23 AM   TRIG 325 07/12/2012 07:55 AM   HDL 33 (L) 12/11/2020 10:23 AM   CHOLHDL 6.9 (H) 12/11/2020 10:23 AM   LDLCALC 156 (H) 12/11/2020 10:23 AM    Wt Readings from Last 3 Encounters:  02/18/21 206 lb 6.4 oz (93.6 kg)  01/30/21 211 lb (95.7 kg)  01/08/21 208  lb 1.9 oz (94.4 kg)      ASSESSMENT & PLAN:    UTI UA reviewed Check urine culture Started Macrobid Maintain adequate hydration  Time:   Today, I have spent 9 minutes reviewing the chart, including problem list, medications, and with the patient with telehealth technology discussing the above problems.   Medication Adjustments/Labs  and Tests Ordered: Current medicines are reviewed at length with the patient today.  Concerns regarding medicines are outlined above.   Tests Ordered: Orders Placed This Encounter  Procedures  . Urine Culture  . POCT URINALYSIS DIP (CLINITEK)    Medication Changes: Meds ordered this encounter  Medications  . nitrofurantoin, macrocrystal-monohydrate, (MACROBID) 100 MG capsule    Sig: Take 1 capsule (100 mg total) by mouth 2 (two) times daily.    Dispense:  10 capsule    Refill:  0     Note: This dictation was prepared with Dragon dictation along with smaller phrase technology. Similar sounding words can be transcribed inadequately or may not be corrected upon review. Any transcriptional errors that result from this process are unintentional.      Disposition:  Follow up  Signed, Lindell Spar, MD  02/19/2021 4:22 PM     [place]

## 2021-02-21 LAB — URINE CULTURE

## 2021-02-24 NOTE — Progress Notes (Signed)
CC'ED TO PCP 

## 2021-03-10 ENCOUNTER — Encounter: Payer: Self-pay | Admitting: Ophthalmology

## 2021-03-10 ENCOUNTER — Other Ambulatory Visit: Payer: Self-pay

## 2021-03-11 ENCOUNTER — Telehealth: Payer: Self-pay

## 2021-03-11 ENCOUNTER — Other Ambulatory Visit: Payer: Self-pay

## 2021-03-11 DIAGNOSIS — Z20822 Contact with and (suspected) exposure to covid-19: Secondary | ICD-10-CM | POA: Diagnosis not present

## 2021-03-11 MED ORDER — PEG 3350-KCL-NA BICARB-NACL 420 G PO SOLR: 4000.0000 mL | ORAL | 0 refills | Status: DC

## 2021-03-11 NOTE — Telephone Encounter (Signed)
Pre-op appt 04/17/21. Appt letter mailed with procedure instructions.

## 2021-03-11 NOTE — Telephone Encounter (Signed)
Called pt, TCS w/Propofol ASA 3 w/Dr. Abbey Chatters scheduled for 04/21/21 at 9:30am. Rx for prep sent to pharmacy. Orders entered.

## 2021-03-19 ENCOUNTER — Encounter
Admission: RE | Disposition: A | Discharge status: Home / Self Care | Payer: Self-pay | Source: Home / Self Care | Attending: Ophthalmology

## 2021-03-19 ENCOUNTER — Ambulatory Visit: Payer: Medicare Other | Admitting: Anesthesiology

## 2021-03-19 ENCOUNTER — Other Ambulatory Visit: Payer: Self-pay

## 2021-03-19 ENCOUNTER — Ambulatory Visit
Admission: RE | Admit: 2021-03-19 | Discharge: 2021-03-19 | Disposition: A | Discharge status: Home / Self Care | Payer: Medicare Other | Attending: Ophthalmology | Admitting: Ophthalmology

## 2021-03-19 ENCOUNTER — Encounter: Payer: Self-pay | Admitting: Ophthalmology

## 2021-03-19 DIAGNOSIS — Z888 Allergy status to other drugs, medicaments and biological substances status: Secondary | ICD-10-CM | POA: Insufficient documentation

## 2021-03-19 DIAGNOSIS — Z86711 Personal history of pulmonary embolism: Secondary | ICD-10-CM | POA: Insufficient documentation

## 2021-03-19 DIAGNOSIS — Z8249 Family history of ischemic heart disease and other diseases of the circulatory system: Secondary | ICD-10-CM | POA: Diagnosis not present

## 2021-03-19 DIAGNOSIS — Z87891 Personal history of nicotine dependence: Secondary | ICD-10-CM | POA: Insufficient documentation

## 2021-03-19 DIAGNOSIS — E039 Hypothyroidism, unspecified: Secondary | ICD-10-CM | POA: Diagnosis not present

## 2021-03-19 DIAGNOSIS — Z833 Family history of diabetes mellitus: Secondary | ICD-10-CM | POA: Diagnosis not present

## 2021-03-19 DIAGNOSIS — H2511 Age-related nuclear cataract, right eye: Secondary | ICD-10-CM | POA: Diagnosis not present

## 2021-03-19 DIAGNOSIS — Z91040 Latex allergy status: Secondary | ICD-10-CM | POA: Insufficient documentation

## 2021-03-19 DIAGNOSIS — Z800 Family history of malignant neoplasm of digestive organs: Secondary | ICD-10-CM | POA: Diagnosis not present

## 2021-03-19 DIAGNOSIS — Z886 Allergy status to analgesic agent status: Secondary | ICD-10-CM | POA: Insufficient documentation

## 2021-03-19 DIAGNOSIS — Z79890 Hormone replacement therapy (postmenopausal): Secondary | ICD-10-CM | POA: Diagnosis not present

## 2021-03-19 DIAGNOSIS — Z882 Allergy status to sulfonamides status: Secondary | ICD-10-CM | POA: Insufficient documentation

## 2021-03-19 DIAGNOSIS — Z7951 Long term (current) use of inhaled steroids: Secondary | ICD-10-CM | POA: Insufficient documentation

## 2021-03-19 DIAGNOSIS — D573 Sickle-cell trait: Secondary | ICD-10-CM | POA: Diagnosis not present

## 2021-03-19 DIAGNOSIS — H25811 Combined forms of age-related cataract, right eye: Secondary | ICD-10-CM | POA: Diagnosis not present

## 2021-03-19 DIAGNOSIS — I10 Essential (primary) hypertension: Secondary | ICD-10-CM | POA: Insufficient documentation

## 2021-03-19 DIAGNOSIS — Z79899 Other long term (current) drug therapy: Secondary | ICD-10-CM | POA: Insufficient documentation

## 2021-03-19 HISTORY — PX: CATARACT EXTRACTION W/PHACO: SHX586

## 2021-03-19 SURGERY — PHACOEMULSIFICATION, CATARACT, WITH IOL INSERTION
Anesthesia: General | Site: Eye | Laterality: Right

## 2021-03-19 MED ORDER — FENTANYL CITRATE (PF) 100 MCG/2ML IJ SOLN
INTRAMUSCULAR | Status: DC | PRN
  Administered 2021-03-19: 100 ug via INTRAVENOUS
  Administered 2021-03-19: 50 ug via INTRAVENOUS

## 2021-03-19 MED ORDER — TETRACAINE HCL 0.5 % OP SOLN
1.0000 [drp] | OPHTHALMIC | Status: DC | PRN
  Administered 2021-03-19 (×3): 1 [drp] via OPHTHALMIC

## 2021-03-19 MED ORDER — ACETAMINOPHEN 325 MG PO TABS
325.0000 mg | ORAL_TABLET | ORAL | Status: DC | PRN
Start: 2021-03-19 — End: 2021-03-19

## 2021-03-19 MED ORDER — NA HYALUR & NA CHOND-NA HYALUR 0.4-0.35 ML IO KIT
PACK | INTRAOCULAR | Status: DC | PRN
  Administered 2021-03-19: 1 mL via INTRAOCULAR

## 2021-03-19 MED ORDER — CYCLOPENTOLATE HCL 2 % OP SOLN
1.0000 [drp] | OPHTHALMIC | Status: DC | PRN
  Administered 2021-03-19 (×3): 1 [drp] via OPHTHALMIC

## 2021-03-19 MED ORDER — BRIMONIDINE TARTRATE-TIMOLOL 0.2-0.5 % OP SOLN
OPHTHALMIC | Status: DC | PRN
  Administered 2021-03-19: 1 [drp] via OPHTHALMIC

## 2021-03-19 MED ORDER — LACTATED RINGERS IV SOLN: INTRAVENOUS | Status: DC

## 2021-03-19 MED ORDER — MIDAZOLAM HCL 2 MG/2ML IJ SOLN
INTRAMUSCULAR | Status: DC | PRN
  Administered 2021-03-19: 1 mg via INTRAVENOUS
  Administered 2021-03-19: 2 mg via INTRAVENOUS

## 2021-03-19 MED ORDER — ACETAMINOPHEN 160 MG/5ML PO SOLN: 325.0000 mg | ORAL | Status: DC | PRN

## 2021-03-19 MED ORDER — LIDOCAINE HCL (PF) 2 % IJ SOLN
INTRAOCULAR | Status: DC | PRN
  Administered 2021-03-19: 2 mL

## 2021-03-19 MED ORDER — POLYMYXIN B-TRIMETHOPRIM 10000-0.1 UNIT/ML-% OP SOLN
OPHTHALMIC | Status: DC | PRN
  Administered 2021-03-19: 1 [drp] via OPHTHALMIC

## 2021-03-19 MED ORDER — PHENYLEPHRINE HCL 10 % OP SOLN
1.0000 [drp] | OPHTHALMIC | Status: DC | PRN
  Administered 2021-03-19 (×3): 1 [drp] via OPHTHALMIC

## 2021-03-19 MED ORDER — EPINEPHRINE PF 1 MG/ML IJ SOLN
INTRAOCULAR | Status: DC | PRN
  Administered 2021-03-19: 48 mL via OPHTHALMIC

## 2021-03-19 SURGICAL SUPPLY — 18 items
CANNULA ANT/CHMB 27GA (MISCELLANEOUS) ×3 IMPLANT
GLOVE ORTHO TXT STRL SZ7.5 (GLOVE) ×6 IMPLANT
GOWN STRL REUS W/ TWL LRG LVL3 (GOWN DISPOSABLE) ×2 IMPLANT
GOWN STRL REUS W/TWL LRG LVL3 (GOWN DISPOSABLE) ×6
LENS IOL DIOP 20.0 (Intraocular Lens) ×3 IMPLANT
LENS IOL TECNIS MONO 20.0 (Intraocular Lens) ×1 IMPLANT
MARKER SKIN DUAL TIP RULER LAB (MISCELLANEOUS) ×3 IMPLANT
NEEDLE CAPSULORHEX 25GA (NEEDLE) ×3 IMPLANT
NEEDLE FILTER BLUNT 18X 1/2SAF (NEEDLE) ×4
NEEDLE FILTER BLUNT 18X1 1/2 (NEEDLE) ×2 IMPLANT
PACK CATARACT BRASINGTON (MISCELLANEOUS) ×3 IMPLANT
PACK EYE AFTER SURG (MISCELLANEOUS) ×3 IMPLANT
PACK OPTHALMIC (MISCELLANEOUS) ×3 IMPLANT
SOLUTION OPHTHALMIC SALT (MISCELLANEOUS) ×3 IMPLANT
SYR 3ML LL SCALE MARK (SYRINGE) ×6 IMPLANT
SYR TB 1ML LUER SLIP (SYRINGE) ×3 IMPLANT
WATER STERILE IRR 250ML POUR (IV SOLUTION) ×3 IMPLANT
WIPE NON LINTING 3.25X3.25 (MISCELLANEOUS) ×3 IMPLANT

## 2021-03-19 NOTE — H&P (Signed)
[facility]   Primary Care Physician:  Lindell Spar, MD Ophthalmologist: Dr. Leandrew Koyanagi  Pre-Procedure History & Physical: HPI:  [name] is a 70 y.o. female here for ophthalmic surgery.   Past Medical History:  Diagnosis Date   Anxiety    Arthritis    Asthma    Cataract    Phreesia 01/05/2021   Chest pain    a. 02/2000 Cath: nl cors, EF 60%;  b. 10/2010 MV: nl LV, no ischemia/infarct;  c. 03/2014 Admit c/p, r/o->grief from husbands death.   Chronic RUQ pain 2007-12-13   EUS slightly dilated CBD (7.9m), otherwise nl   Depression    Eczema    Exposure to chemical inhalation mid 1970s   Fatty liver    G6PD deficiency    GERD (gastroesophageal reflux disease)    Glaucoma    History of pulmonary embolus (PE)    Treated with Eliquis 203-09-16  HTN (hypertension)    Hypercholesteremia    a. intolerant to statins.   IBS (irritable bowel syndrome)    Kidney stone    Legally blind    Palpitations    Recurrent upper respiratory infection (URI)    Renal cyst    Sickle cell trait (H[facility])    Tubular adenoma of colon 09/17/2011   Urticaria     Past Surgical History:  Procedure Laterality Date   ABDOMINAL HYSTERECTOMY     ADENOIDECTOMY     ANGIOPLASTY     APPENDECTOMY     BIOPSY N/A 03/14/2013   Procedure: SMALL BOWEL AND GASTRIC BIOPSIES (Procedure #1);  Surgeon: SDanie Binder MD;  [facility];  Service: Endoscopy;  Laterality: N/A;   CARDIAC CATHETERIZATION Left 02/11/2000   CATARACT EXTRACTION Left    CHOLECYSTECTOMY  12/2007   COLONOSCOPY  12/22/10   RYYT:KPTWSF cecal adenomatous polyp   COLONOSCOPY WITH PROPOFOL N/A 03/31/2016   Dr. FOneida Alar four sessile polyps rectum/sigmoid colon, diverticulosi, ext/int hemorrhoids. hyperplastic polyps, next tcs 5 years.   complete hysterectomy     ENTEROSCOPY N/A 03/14/2013   SKCL:EXNTgastritis/ulcers has healed   ESOPHAGOGASTRODUODENOSCOPY  09/22/2011   SZGY:FVCBgastritis/Duodenitis   FLEXIBLE SIGMOIDOSCOPY N/A  03/14/2013   SLF:3 colon polyp removed/moderate sized internal hemorrhoids   FLEXIBLE SIGMOIDOSCOPY N/A 06/09/2016   Procedure: FLEXIBLE SIGMOIDOSCOPY;  Surgeon: SDanie Binder MD;  [facility];  Service: Endoscopy;  Laterality: N/A;  rectal polyps times 2   FLEXIBLE SIGMOIDOSCOPY N/A 03/18/2018   Procedure: FLEXIBLE SIGMOIDOSCOPY;  Surgeon: FDanie Binder MD;  [facility];  Service: Endoscopy;  Laterality: N/A;  9:15am   FOOT SURGERY     bunion removal left   GIVENS CAPSULE STUDY N/A 02/10/2013   Procedure: GIVENS CAPSULE STUDY;  Surgeon: SDanie Binder MD;  [facility];  Service: Endoscopy;  Laterality: N/A;  [place]    right    HEMORRHOID BANDING N/A 03/14/2013   Procedure: HEMORRHOID BANDING (Procedure #3)  3 bands applied [id number] 02/02/2014 ;  Surgeon: SDanie Binder MD;  [facility]: [facility];  Service: Endoscopy;  Laterality: N/A;   HEMORRHOID SURGERY N/A 04/24/2016   Procedure: EXTENSIVE HEMORRHOIDECTOMY;  Surgeon: JVickie Epley MD;  Location: [facility];  Service: General;  Laterality: N/A;   LAPAROSCOPY     adhesions   POLYPECTOMY N/A 03/14/2013   SGYK:ZLDJGastritis . ULCERS SEEN ON MAY 6 HAVE HEALED   POLYPECTOMY  03/31/2016   Procedure: POLYPECTOMY;  Surgeon: Danie Binder, MD;  [facility];  Service: Endoscopy;;  sigmoid colon polyps x2, rectal polyps x2   TONSILLECTOMY     TUBAL LIGATION      Prior to Admission medications   Medication Sig Start Date End Date Taking? Authorizing Provider  albuterol (PROVENTIL) (2.5 MG/3ML) 0.083% nebulizer solution Take 3 mLs (2.5 mg total) by nebulization every 4 (four) hours as needed for wheezing or shortness of breath. 11/25/20  Yes [place], Modena Nunnery, MD  albuterol (VENTOLIN HFA) 108 (90 Base) MCG/ACT inhaler Inhale 2 puffs into the lungs every 4 (four) hours as needed for wheezing. 11/25/20  Yes [place], Modena Nunnery, MD  ALPRAZolam Duanne Moron) 1 MG tablet Take 1 tablet (1 mg  total) by mouth 2 (two) times daily as needed for anxiety. 11/25/20  Yes [place], Modena Nunnery, MD  brimonidine (ALPHAGAN) 0.2 % ophthalmic solution Place 1 drop into both eyes 2 (two) times daily. 11/25/20  Yes [place], Modena Nunnery, MD  budesonide-formoterol [facility]) 160-4.5 MCG/ACT inhaler Inhale 2 puffs into the lungs as needed. 11/25/20  Yes [place], Modena Nunnery, MD  cyanocobalamin 1000 MCG tablet Take 1,000 mcg by mouth daily.   Yes [provider]  cyclobenzaprine (FLEXERIL) 10 MG tablet TAKE ONE TABLET BY MOUTH DAILY  as needed muscle spasms 11/25/20  Yes [place], Modena Nunnery, MD  dexlansoprazole (DEXILANT) 60 MG capsule Take 1 capsule (60 mg total) by mouth daily. 11/25/20  Yes [place], Modena Nunnery, MD  dicyclomine (BENTYL) 10 MG capsule Take one capsule before meals and at bedtime AS NEEDED for abdominal pain and frequent stool. HOLD for constipation 11/25/20  Yes [place], Modena Nunnery, MD  Ergocalciferol (VITAMIN D2) 50 MCG (2000 UT) TABS Take 2,000 Units by mouth daily. 11/11/18  Yes [place], Modena Nunnery, MD  fenofibrate (TRICOR) 145 MG tablet Take 1 tablet (145 mg total) by mouth daily. 11/25/20  Yes [place], Modena Nunnery, MD  folic acid (FOLVITE) 1 MG tablet Take 1 mg by mouth daily.   Yes [provider]  hyoscyamine (ANASPAZ) 0.125 MG TBDP disintergrating tablet Place 0.125 mg under the tongue as needed.    Yes [provider]  irbesartan (AVAPRO) 150 MG tablet Take 0.5 tablets (75 mg total) by mouth daily. 11/25/20  Yes [place], Modena Nunnery, MD  levothyroxine (SYNTHROID) 25 MCG tablet Take 1 tablet (25 mcg total) by mouth daily before breakfast. 09/30/20  Yes [place], Marella Chimes, MD  magnesium oxide (HM MAGNESIUM) 400 MG tablet Take 2 tablets (800 mg total) by mouth daily. Patient taking differently: Take 400 mg by mouth daily. Takes 1 tablet daily. 11/11/18  Yes [place], Modena Nunnery, MD  meclizine (ANTIVERT) 12.5 MG tablet Take 1 tablet (12.5 mg total) by mouth 3 (three) times daily as needed for  dizziness. 11/25/20  Yes [place], Modena Nunnery, MD  naproxen (NAPROSYN) 500 MG tablet Take 500 mg by mouth as needed.   Yes [provider]  neomycin-polymyxin b-dexamethasone (MAXITROL) 3.5-10000-0.1 [place] 1 application into both eyes at bedtime as needed. 01/12/20  Yes [place], Modena Nunnery, MD  nitrofurantoin, macrocrystal-monohydrate, (MACROBID) 100 MG capsule Take 1 capsule (100 mg total) by mouth 2 (two) times daily. 02/19/21  Yes Lindell Spar, MD  NON FORMULARY Take 1 tablet by mouth daily as needed (bloat). FD GUARD   Yes [provider]  ondansetron (ZOFRAN) 4 MG tablet Take 1 tablet (4 mg total) by mouth every 8 (eight) hours as needed for nausea or vomiting. 11/25/20  Yes [place],  Modena Nunnery, MD  Probiotic Product (PROBIOTIC DAILY PO) Take 1 capsule by mouth daily.   Yes [provider]  senna (SENOKOT) 8.6 MG TABS tablet Take 1 tablet by mouth daily as needed.    Yes [provider]  UNABLE TO FIND Med Name: lidocaine 5% pramoxine 1% ointment per rectum as needed.   Yes [provider]  zolpidem (AMBIEN) 10 MG tablet TAKE ONE TABLET BY MOUTH DAILY AT BEDTIME. 02/05/21  Yes Lindell Spar, MD  chlorpheniramine-HYDROcodone [facility] PENNKINETIC ER) 10-8 MG/5ML SUER Take 5 mLs by mouth every 12 (twelve) hours as needed. 11/25/20   Alycia Rossetti, MD  estradiol (ESTRACE VAGINAL) 0.1 MG/GM vaginal cream Place 1 Applicatorful vaginally daily as needed. 11/25/20   Alycia Rossetti, MD  HYDROcodone-acetaminophen (NORCO/VICODIN) 5-325 MG tablet Take 1 tablet by mouth every 6 (six) hours as needed for moderate pain. 11/25/20   [place], Modena Nunnery, MD  latanoprost (XALATAN) 0.005 % ophthalmic solution Place 1 drop into both eyes at bedtime. 11/25/20   Alycia Rossetti, MD  Lidocaine-Hydrocortisone Ace 3-2.5 % KIT APPLY TO RECTUM QID for 2 weeks then AS NEEDED FOR RECTAL PAIN OR BLEEDING 06/11/20   Annitta Needs, NP  Lifitegrast [place]) 5 % SOLN INSTILL 1 DROP IN  BOTH EYES EVERY DAY AS NEEDED [facility] CONTAINER SHOULD YIELD 2 DROPS) STORE IN ORIGINAL FOIL POUCH 11/25/20   [place], Modena Nunnery, MD  Menthol, Topical Analgesic, (BIOFREEZE EX) Apply topically daily as needed (pain).     [provider]  metroNIDAZOLE (METROCREAM) 0.75 % cream Apply 1 application topically as needed. 11/25/20   Alycia Rossetti, MD  nitroGLYCERIN (NITROLINGUAL) 0.4 MG/SPRAY spray Place 1 spray under the tongue every 5 (five) minutes as needed for chest pain. 07/22/20   [place], Modena Nunnery, MD  polyethylene glycol-electrolytes (TRILYTE) 420 g solution Take 4,000 mLs by mouth as directed. 03/11/21   Eloise Harman, DO    Allergies as of 01/22/2021 - Review Complete 01/08/2021  Allergen Reaction Noted   Adhesive [tape] Other (See Comments) 03/10/2013   Aspirin Other (See Comments) 09/18/2011   Keflex [cephalexin] Hives 11/26/2016   Levaquin [levofloxacin in d5w]  12/29/2016   Other  10/08/2017   Statins Other (See Comments) 02/28/2013   Sulfonamide derivatives Other (See Comments) 12/10/2010   Latex Rash 12/13/2011    Family History  Problem Relation Age of Onset   Colon cancer Mother        66s   Urticaria Mother    Hypertension Mother    Cancer Mother    Cancer Father        oral   Crohn's disease Sister    Colon cancer Maternal Grandfather    Colon cancer Paternal Grandfather    Diabetes Brother    Colon cancer Maternal Grandmother    Anesthesia problems Neg Hx     Social History   Socioeconomic History   Marital status: Widowed    Spouse name: Not on file   Number of children: Not on file   Years of education: Not on file   Highest education level: Not on file  Occupational History   Occupation: homemaker    Employer: UNEMPLOYED  Tobacco Use   Smoking status: Former    Packs/day: 0.50    Years: 30.00    Pack years: 15.00    Types: Cigarettes   Smokeless tobacco: Former    Quit date: 10/15/1996   Tobacco comments:    Stopped smoking ~ 2000  Vaping Use   Vaping Use: Never used  Substance and Sexual Activity   Alcohol use: Yes    Comment: occassion   Drug use: No   Sexual activity: Yes    Birth control/protection: Surgical  Other Topics Concern   Not on file  Social History Narrative   Does not routinely exercise. Husband passed in JUN 2015 due to prostate ca.   Social Determinants of Health   Financial Resource Strain: Not on file  Food Insecurity: Not on file  Transportation Needs: Not on file  Physical Activity: Not on file  Stress: Not on file  Social Connections: Not on file  Intimate Partner Violence: Not on file    Review of Systems: See HPI, otherwise negative ROS  Physical Exam: BP 107/68   Pulse 83   Temp (!) 97.1 F (36.2 C) (Temporal)   Ht 5' 7"  (1.702 m)   Wt 93.9 kg   SpO2 96%   BMI 32.42 kg/m  General:   Alert,  pleasant and cooperative in NAD Head:  Normocephalic and atraumatic. Lungs:  Clear to auscultation.    Heart:  Regular rate and rhythm.   Impression/Plan: [name] is here for ophthalmic surgery.  Risks, benefits, limitations, and alternatives regarding ophthalmic surgery have been reviewed with the patient.  Questions have been answered.  All parties agreeable.   Leandrew Koyanagi, MD  03/19/2021, 7:36 AM

## 2021-03-19 NOTE — Op Note (Signed)
LOCATION:  [place]   PREOPERATIVE DIAGNOSIS:    Nuclear sclerotic cataract right eye. H25.11   POSTOPERATIVE DIAGNOSIS:  Nuclear sclerotic cataract right eye.     PROCEDURE:  Phacoemusification with posterior chamber intraocular lens placement of the right eye   ULTRASOUND TIME: Procedure(s): CATARACT EXTRACTION PHACO AND INTRAOCULAR LENS PLACEMENT (IOC) RIGHT 6.75 00:50.4 (Right)  LENS:   Implant Name Type Inv. Item Serial No. Manufacturer Lot No. LRB No. Used Action  LENS IOL DIOP 20.0 - [id number] Intraocular Lens LENS IOL DIOP 20.0 [id number]   Right 1 Implanted         SURGEON:  Wyonia Hough, MD   ANESTHESIA:  Topical with tetracaine drops and 2% Xylocaine jelly, augmented with 1% preservative-free intracameral lidocaine.    COMPLICATIONS:  None.   DESCRIPTION OF PROCEDURE:  The patient was identified in the holding room and transported to the operating room and placed in the supine position under the operating microscope.  The right eye was identified as the operative eye and it was prepped and draped in the usual sterile ophthalmic fashion.   A 1 millimeter clear-corneal paracentesis was made at the 12:00 position.  0.5 ml of preservative-free 1% lidocaine was injected into the anterior chamber. The anterior chamber was filled with Viscoat viscoelastic.  A 2.4 millimeter keratome was used to make a near-clear corneal incision at the 9:00 position.  A curvilinear capsulorrhexis was made with a cystotome and capsulorrhexis forceps.  Balanced salt solution was used to hydrodissect and hydrodelineate the nucleus.   Phacoemulsification was then used in stop and chop fashion to remove the lens nucleus and epinucleus.  The remaining cortex was then removed using the irrigation and aspiration handpiece. Provisc was then placed into the capsular bag to distend it for lens placement.  A lens was then injected into the capsular bag.  The remaining  viscoelastic was aspirated.   Wounds were hydrated with balanced salt solution.  The anterior chamber was inflated to a physiologic pressure with balanced salt solution.  No wound leaks were noted.  Polytrim, Timolol and Brimonidine drops were applied to the eye.  The patient was taken to the recovery room in stable condition without complications of anesthesia or surgery.   [name] 03/19/2021, 8:04 AM

## 2021-03-19 NOTE — Anesthesia Preprocedure Evaluation (Addendum)
Anesthesia Evaluation  Patient identified by MRN, date of birth, ID band Patient awake    Reviewed: Allergy & Precautions, H&P , NPO status , Patient's Chart, lab work & pertinent test results, reviewed documented beta blocker date and time   Airway Mallampati: II  TM Distance: >3 FB Neck ROM: full    Dental no notable dental hx.    Pulmonary asthma , former smoker,    Pulmonary exam normal breath sounds clear to auscultation       Cardiovascular Exercise Tolerance: Good hypertension, Normal cardiovascular exam Rhythm:regular Rate:Normal     Neuro/Psych Anxiety Depression negative neurological ROS     GI/Hepatic Neg liver ROS, GERD  ,  Endo/Other  Hypothyroidism   Renal/GU Renal disease  negative genitourinary   Musculoskeletal   Abdominal   Peds  Hematology negative hematology ROS (+)   Anesthesia Other Findings   Reproductive/Obstetrics negative OB ROS                             Anesthesia Physical Anesthesia Plan  ASA: 2  Anesthesia Plan: MAC   Post-op Pain Management:    Induction:   PONV Risk Score and Plan:   Airway Management Planned:   Additional Equipment:   Intra-op Plan:   Post-operative Plan:   Informed Consent: I have reviewed the patients History and Physical, chart, labs and discussed the procedure including the risks, benefits and alternatives for the proposed anesthesia with the patient or authorized representative who has indicated his/her understanding and acceptance.     Dental Advisory Given  Plan Discussed with: CRNA and Anesthesiologist  Anesthesia Plan Comments:        Anesthesia Quick Evaluation

## 2021-03-19 NOTE — Transfer of Care (Signed)
Immediate Anesthesia Transfer of Care Note  Patient: [name]  Procedure(s) Performed: CATARACT EXTRACTION PHACO AND INTRAOCULAR LENS PLACEMENT (IOC) RIGHT 6.75 00:50.4 (Right: Eye)  Patient Location: PACU  Anesthesia Type: General  Level of Consciousness: awake, alert  and patient cooperative  Airway and Oxygen Therapy: Patient Spontanous Breathing and Patient connected to supplemental oxygen  Post-op Assessment: Post-op Vital signs reviewed, Patient's Cardiovascular Status Stable, Respiratory Function Stable, Patent Airway and No signs of Nausea or vomiting  Post-op Vital Signs: Reviewed and stable  Complications: No notable events documented.

## 2021-03-19 NOTE — Anesthesia Postprocedure Evaluation (Signed)
Anesthesia Post Note  Patient: [name]  Procedure(s) Performed: CATARACT EXTRACTION PHACO AND INTRAOCULAR LENS PLACEMENT (IOC) RIGHT 6.75 00:50.4 (Right: Eye)     Patient location during evaluation: PACU Anesthesia Type: General Level of consciousness: awake and alert Pain management: pain level controlled Vital Signs Assessment: post-procedure vital signs reviewed and stable Respiratory status: spontaneous breathing, nonlabored ventilation, respiratory function stable and patient connected to nasal cannula oxygen Cardiovascular status: stable and blood pressure returned to baseline Postop Assessment: no apparent nausea or vomiting Anesthetic complications: no   No notable events documented.  Trecia Rogers

## 2021-03-20 ENCOUNTER — Encounter: Payer: Self-pay | Admitting: Ophthalmology

## 2021-03-24 DIAGNOSIS — E039 Hypothyroidism, unspecified: Secondary | ICD-10-CM | POA: Diagnosis not present

## 2021-03-25 LAB — T4, FREE: Free T4: 1.36 ng/dL (ref 0.82–1.77)

## 2021-03-25 LAB — CALCIUM: Calcium: 10 mg/dL (ref 8.7–10.3)

## 2021-03-25 LAB — MAGNESIUM: Magnesium: 1.7 mg/dL (ref 1.6–2.3)

## 2021-03-25 LAB — TSH: TSH: 1.4 u[IU]/mL (ref 0.450–4.500)

## 2021-03-31 ENCOUNTER — Encounter: Payer: Self-pay | Admitting: "Endocrinology

## 2021-03-31 ENCOUNTER — Other Ambulatory Visit: Payer: Self-pay

## 2021-03-31 ENCOUNTER — Ambulatory Visit (INDEPENDENT_AMBULATORY_CARE_PROVIDER_SITE_OTHER): Payer: Medicare Other | Admitting: "Endocrinology

## 2021-03-31 VITALS — BP 120/74 | HR 84 | Ht 67.0 in | Wt 205.4 lb

## 2021-03-31 DIAGNOSIS — E039 Hypothyroidism, unspecified: Secondary | ICD-10-CM | POA: Diagnosis not present

## 2021-03-31 MED ORDER — LEVOTHYROXINE SODIUM 25 MCG PO TABS: 25.0000 ug | ORAL_TABLET | Freq: Every day | ORAL | 1 refills | Status: DC

## 2021-03-31 NOTE — Progress Notes (Signed)
03/31/2021, 12:40 PM  Endocrinology follow-up note   Subjective:    Patient ID: [name], female    DOB: 05-20-1951, PCP Lindell Spar, MD   Past Medical History:  Diagnosis Date   Anxiety    Arthritis    Asthma    Cataract    Phreesia 01/05/2021   Chest pain    a. 02/2000 Cath: nl cors, EF 60%;  b. 2010-11-09 MV: nl LV, no ischemia/infarct;  c. 03/2014 Admit c/p, r/o->grief from husbands death.   Chronic RUQ pain 11-10-2007   EUS slightly dilated CBD (7.35m), otherwise nl   Depression    Eczema    Exposure to chemical inhalation mid 1970s   Fatty liver    G6PD deficiency    GERD (gastroesophageal reflux disease)    Glaucoma    History of pulmonary embolus (PE)    Treated with Eliquis 22016/02/05  HTN (hypertension)    Hypercholesteremia    a. intolerant to statins.   IBS (irritable bowel syndrome)    Kidney stone    Legally blind    Palpitations    Recurrent upper respiratory infection (URI)    Renal cyst    Sickle cell trait (HCC)    Tubular adenoma of colon 09/17/2011   Urticaria    Past Surgical History:  Procedure Laterality Date   ABDOMINAL HYSTERECTOMY     ADENOIDECTOMY     ANGIOPLASTY     APPENDECTOMY     BIOPSY N/A 03/14/2013   Procedure: SMALL BOWEL AND GASTRIC BIOPSIES (Procedure #1);  Surgeon: SDanie Binder MD;  Location: [facility];  Service: Endoscopy;  Laterality: N/A;   CARDIAC CATHETERIZATION Left 02/11/2000   CATARACT EXTRACTION Left    CATARACT EXTRACTION W/PHACO Right 03/19/2021   Procedure: CATARACT EXTRACTION PHACO AND INTRAOCULAR LENS PLACEMENT (ICana RIGHT 6.75 00:50.4;  Surgeon: BLeandrew Koyanagi MD;  [facility]  Service: Ophthalmology;  Laterality: Right;   CHOLECYSTECTOMY  12/2007   COLONOSCOPY  12/22/10   RMEQ:ASTMHD cecal adenomatous polyp   COLONOSCOPY WITH PROPOFOL N/A 03/31/2016   Dr. FOneida Alar four sessile polyps rectum/sigmoid colon, diverticulosi, ext/int  hemorrhoids. hyperplastic polyps, next tcs 5 years.   complete hysterectomy     ENTEROSCOPY N/A 03/14/2013   SQQI:WLNLgastritis/ulcers has healed   ESOPHAGOGASTRODUODENOSCOPY  09/22/2011   SGXQ:JJHEgastritis/Duodenitis   FLEXIBLE SIGMOIDOSCOPY N/A 03/14/2013   SLF:3 colon polyp removed/moderate sized internal hemorrhoids   FLEXIBLE SIGMOIDOSCOPY N/A 06/09/2016   Procedure: FLEXIBLE SIGMOIDOSCOPY;  Surgeon: SDanie Binder MD;  [facility];  Service: Endoscopy;  Laterality: N/A;  rectal polyps times 2   FLEXIBLE SIGMOIDOSCOPY N/A 03/18/2018   Procedure: FLEXIBLE SIGMOIDOSCOPY;  Surgeon: FDanie Binder MD;  [facility];  Service: Endoscopy;  Laterality: N/A;  9:15am   FOOT SURGERY     bunion removal left   GIVENS CAPSULE STUDY N/A 02/10/2013   Procedure: GIVENS CAPSULE STUDY;  Surgeon: SDanie Binder MD;  [facility]: [facility];  Service: Endoscopy;  Laterality: N/A;  [place]    right    HEMORRHOID BANDING N/A 03/14/2013   Procedure: HEMORRHOID BANDING (Procedure #3)  3 bands applied [id number] 02/02/2014 ;  Surgeon: Danie Binder, MD;  Location: [facility];  Service: Endoscopy;  Laterality: N/A;   HEMORRHOID SURGERY N/A 04/24/2016   Procedure: EXTENSIVE HEMORRHOIDECTOMY;  Surgeon: Vickie Epley, MD;  Location: [facility];  Service: General;  Laterality: N/A;   LAPAROSCOPY     adhesions   POLYPECTOMY N/A 03/14/2013   OYD:XAJO Gastritis . ULCERS SEEN ON MAY 6 HAVE HEALED   POLYPECTOMY  03/31/2016   Procedure: POLYPECTOMY;  Surgeon: Danie Binder, MD;  Location: [facility];  Service: Endoscopy;;  sigmoid colon polyps x2, rectal polyps x2   TONSILLECTOMY     TUBAL LIGATION     Social History   Socioeconomic History   Marital status: Widowed    Spouse name: Not on file   Number of children: Not on file   Years of education: Not on file   Highest education level: Not on file  Occupational History   Occupation: homemaker    Employer:  UNEMPLOYED  Tobacco Use   Smoking status: Former    Packs/day: 0.50    Years: 30.00    Pack years: 15.00    Types: Cigarettes   Smokeless tobacco: Former    Quit date: 10/15/1996   Tobacco comments:    Stopped smoking ~ 2000  Vaping Use   Vaping Use: Never used  Substance and Sexual Activity   Alcohol use: Yes    Comment: occassion   Drug use: No   Sexual activity: Yes    Birth control/protection: Surgical  Other Topics Concern   Not on file  Social History Narrative   Does not routinely exercise. Husband passed in JUN 2015 due to prostate ca.   Social Determinants of Health   Financial Resource Strain: Not on file  Food Insecurity: Not on file  Transportation Needs: Not on file  Physical Activity: Not on file  Stress: Not on file  Social Connections: Not on file   Family History  Problem Relation Age of Onset   Colon cancer Mother        33s   Urticaria Mother    Hypertension Mother    Cancer Mother    Cancer Father        oral   Crohn's disease Sister    Colon cancer Maternal Grandfather    Colon cancer Paternal Grandfather    Diabetes Brother    Colon cancer Maternal Grandmother    Anesthesia problems Neg Hx    Outpatient Encounter Medications as of 03/31/2021  Medication Sig   albuterol (PROVENTIL) (2.5 MG/3ML) 0.083% nebulizer solution Take 3 mLs (2.5 mg total) by nebulization every 4 (four) hours as needed for wheezing or shortness of breath.   albuterol (VENTOLIN HFA) 108 (90 Base) MCG/ACT inhaler Inhale 2 puffs into the lungs every 4 (four) hours as needed for wheezing.   ALPRAZolam (XANAX) 1 MG tablet Take 1 tablet (1 mg total) by mouth 2 (two) times daily as needed for anxiety.   brimonidine (ALPHAGAN) 0.2 % ophthalmic solution Place 1 drop into both eyes 2 (two) times daily.   budesonide-formoterol (SYMBICORT) 160-4.5 MCG/ACT inhaler Inhale 2 puffs into the lungs as needed.   chlorpheniramine-HYDROcodone (TUSSIONEX PENNKINETIC ER) 10-8 MG/5ML SUER Take  5 mLs by mouth every 12 (twelve) hours as needed.   cyanocobalamin 1000 MCG tablet Take 1,000 mcg by mouth daily.   cyclobenzaprine (FLEXERIL) 10 MG tablet TAKE ONE TABLET BY MOUTH DAILY  as needed muscle spasms   dexlansoprazole (DEXILANT) 60 MG capsule Take 1 capsule (60  mg total) by mouth daily.   dicyclomine (BENTYL) 10 MG capsule Take one capsule before meals and at bedtime AS NEEDED for abdominal pain and frequent stool. HOLD for constipation   Ergocalciferol (VITAMIN D2) 50 MCG (2000 UT) TABS Take 2,000 Units by mouth daily.   estradiol (ESTRACE VAGINAL) 0.1 MG/GM vaginal cream Place 1 Applicatorful vaginally daily as needed.   fenofibrate (TRICOR) 145 MG tablet Take 1 tablet (145 mg total) by mouth daily.   folic acid (FOLVITE) 1 MG tablet Take 1 mg by mouth daily.   HYDROcodone-acetaminophen (NORCO/VICODIN) 5-325 MG tablet Take 1 tablet by mouth every 6 (six) hours as needed for moderate pain.   hyoscyamine (ANASPAZ) 0.125 MG TBDP disintergrating tablet Place 0.125 mg under the tongue as needed.    irbesartan (AVAPRO) 150 MG tablet Take 0.5 tablets (75 mg total) by mouth daily.   latanoprost (XALATAN) 0.005 % ophthalmic solution Place 1 drop into both eyes at bedtime.   levothyroxine (SYNTHROID) 25 MCG tablet Take 1 tablet (25 mcg total) by mouth daily before breakfast.   Lidocaine-Hydrocortisone Ace 3-2.5 % KIT APPLY TO RECTUM QID for 2 weeks then AS NEEDED FOR RECTAL PAIN OR BLEEDING   Lifitegrast (XIIDRA) 5 % SOLN INSTILL 1 DROP IN BOTH EYES EVERY DAY AS NEEDED (EACH CONTAINER SHOULD YIELD 2 DROPS) STORE IN ORIGINAL FOIL POUCH   magnesium oxide (HM MAGNESIUM) 400 MG tablet Take 2 tablets (800 mg total) by mouth daily.   meclizine (ANTIVERT) 12.5 MG tablet Take 1 tablet (12.5 mg total) by mouth 3 (three) times daily as needed for dizziness.   Menthol, Topical Analgesic, (BIOFREEZE EX) Apply topically daily as needed (pain).    metroNIDAZOLE (METROCREAM) 0.75 % cream Apply 1 application  topically as needed.   naproxen (NAPROSYN) 500 MG tablet Take 500 mg by mouth as needed.   neomycin-polymyxin b-dexamethasone (MAXITROL) 3.5-10000-0.1 OINT Place 1 application into both eyes at bedtime as needed.   nitrofurantoin, macrocrystal-monohydrate, (MACROBID) 100 MG capsule Take 1 capsule (100 mg total) by mouth 2 (two) times daily.   nitroGLYCERIN (NITROLINGUAL) 0.4 MG/SPRAY spray Place 1 spray under the tongue every 5 (five) minutes as needed for chest pain.   NON FORMULARY Take 1 tablet by mouth daily as needed (bloat). FD GUARD   ondansetron (ZOFRAN) 4 MG tablet Take 1 tablet (4 mg total) by mouth every 8 (eight) hours as needed for nausea or vomiting.   polyethylene glycol-electrolytes (TRILYTE) 420 g solution Take 4,000 mLs by mouth as directed.   Probiotic Product (PROBIOTIC DAILY PO) Take 1 capsule by mouth daily.   senna (SENOKOT) 8.6 MG TABS tablet Take 1 tablet by mouth daily as needed.    UNABLE TO FIND Med Name: lidocaine 5% pramoxine 1% ointment per rectum as needed.   zolpidem (AMBIEN) 10 MG tablet TAKE ONE TABLET BY MOUTH DAILY AT BEDTIME.   [DISCONTINUED] levothyroxine (SYNTHROID) 25 MCG tablet Take 1 tablet (25 mcg total) by mouth daily before breakfast.   No facility-administered encounter medications on file as of 03/31/2021.   ALLERGIES: Allergies  Allergen Reactions   Adhesive [Tape] Other (See Comments)    Blisters skin   Aspirin Other (See Comments)    sickle cell trait--  Not recommended unless emergency   Keflex [Cephalexin] Hives   Levaquin [Levofloxacin In D5w]     Altered mental status Affects G6PD   Other     Nut Oil- skin irritation    Statins Other (See Comments)    Myopathy - elevated  CK   Sulfonamide Derivatives Other (See Comments)    Patient has sickle cell trait   Latex Rash    VACCINATION STATUS: Immunization History  Administered Date(s) Administered   Fluad Quad(high Dose 65+) 06/05/2019, 07/22/2020   Influenza Split 07/12/2012    Influenza,inj,Quad PF,6+ Mos 08/03/2014, 07/19/2015, 08/11/2016, 06/24/2017, 07/04/2018   Moderna Sars-Covid-2 Vaccination 11/16/2019, 12/15/2019, 08/02/2020   Pneumococcal Conjugate-13 02/15/2018   Pneumococcal Polysaccharide-23 08/03/2014, 01/12/2020   Tdap 08/25/2012, 08/03/2014   Zoster Recombinat (Shingrix) 01/16/2020, 03/21/2020   Zoster, Live 08/25/2012    HPI [name] is 70 y.o. female who presents today with a medical history as above. she is being seen in follow-up after she was seen in consultation for hypercalcemia which has since resolved, hypothyroidism, vitamin D deficiency.   PMD: Lindell Spar, MD.  She was seen with mild hypercalcemia, likely associated with hydrochlorothiazide.  She continues to have stabilization of her calcium level after she was advised to discontinue hydrochlorothiazide.   She was also found to have mild hypothyroidism for which she was initiated on low-dose levothyroxine 25 mcg p.o. daily before breakfast.  She benefited from this hormone was stabilization of her thyroid function tests towards target.  She has no new complaints today.  She continues to feel better.  She denies any prior history of parathyroid/pituitary/adrenal dysfunction. -She does have a family history of some thyroid dysfunction in her cousins.   She remains on magnesium 400 mg p.o. daily, previsit labs show magnesium at 1.7.    She made some changes in her lifestyle including avoiding some processed carbs. Denies history of diabetes/prediabetes.  She is on multiple vitamin supplements/medications including inhalers for asthma.  Review of Systems Limited as above.  Objective:    Vitals with BMI 03/31/2021 03/19/2021 03/19/2021  Height 5' 7" - -  Weight 205 lbs 6 oz - -  BMI 64.15 - -  Systolic 830 940 768  Diastolic 74 72 57  Pulse 84 79 78    BP 120/74   Pulse 84   Ht 5' 7" (1.702 m)   Wt 205 lb 6.4 oz (93.2 kg)   BMI 32.17 kg/m   Wt Readings from Last  3 Encounters:  03/31/21 205 lb 6.4 oz (93.2 kg)  03/19/21 207 lb (93.9 kg)  02/18/21 206 lb 6.4 oz (93.6 kg)    Physical Exam    CMP ( most recent) CMP     Component Value Date/Time   NA 137 12/11/2020 1023   NA 141 07/12/2012 0755   K 4.5 12/11/2020 1023   K 4.3 07/12/2012 0755   CL 103 12/11/2020 1023   CL 100 07/12/2012 0755   CO2 20 12/11/2020 1023   GLUCOSE 119 (H) 12/11/2020 1023   BUN 19 12/11/2020 1023   BUN 28 (A) 07/12/2012 0755   CREATININE 1.23 (H) 12/11/2020 1023   CALCIUM 10.0 03/24/2021 1021   CALCIUM 11.0 07/12/2012 0755   PROT 7.1 12/11/2020 1023   PROT 7.2 06/11/2020 1411   PROT 8.1 07/12/2012 0755   ALBUMIN 4.7 06/11/2020 1411   AST 39 (H) 12/11/2020 1023   AST 42 07/12/2012 0755   ALT 32 (H) 12/11/2020 1023   ALKPHOS 59 06/11/2020 1411   ALKPHOS 32 07/12/2012 0755   BILITOT 0.8 12/11/2020 1023   BILITOT 0.5 06/11/2020 1411   BILITOT 1.0 07/12/2012 0755   GFRNONAA 36 (L) 07/22/2020 1441   GFRAA 41 (L) 07/22/2020 1441     Diabetic Labs (most recent): Lab Results  Component Value Date   HGBA1C 5.1 12/11/2020   HGBA1C 5.4 06/14/2019   HGBA1C 5.4 07/04/2018     Lipid Panel ( most recent) Lipid Panel     Component Value Date/Time   CHOL 228 (H) 12/11/2020 1023   CHOL 254 07/12/2012 0755   TRIG 231 (H) 12/11/2020 1023   TRIG 325 07/12/2012 0755   HDL 33 (L) 12/11/2020 1023   CHOLHDL 6.9 (H) 12/11/2020 1023   VLDL 42 (H) 05/07/2017 0918   LDLCALC 156 (H) 12/11/2020 1023      Lab Results  Component Value Date   TSH 1.400 03/24/2021   TSH 1.770 09/23/2020   TSH 1.710 05/24/2020   TSH 5.12 (H) 01/12/2020   TSH 1.66 11/24/2016   TSH 3.900 07/29/2015   TSH 2.917 08/03/2014   TSH 6.160 (H) 03/31/2014   TSH 2.026 02/07/2013   TSH 1.948 09/20/2012   FREET4 1.36 03/24/2021   FREET4 1.46 09/23/2020   FREET4 1.31 05/24/2020   FREET4 1.3 01/12/2020   FREET4 1.19 08/03/2014      Assessment & Plan:   1. Hypercalcemia  2.   Hypomagnesemia -Her previsit labs continue to show normalization of calcium after her HCTZ was discontinued.   She will not need 24-hour urine calcium measurement for now.  She will have repeat CMP before her next visit in 6 months.   Her previsit labs show magnesium normal at 1.7, advised to continue magnesium oxide 400 mg p.o. daily at breakfast.   3.  Hypothyroidism -Her previsit thyroid function tests are consistent with appropriate replacement.  She is advised to continue levothyroxine 25 mcg p.o. daily before breakfast.    - We discussed about the correct intake of her thyroid hormone, on empty stomach at fasting, with water, separated by at least 30 minutes from breakfast and other medications,  and separated by more than 4 hours from calcium, iron, multivitamins, acid reflux medications (PPIs). -Patient is made aware of the fact that thyroid hormone replacement is needed for life, dose to be adjusted by periodic monitoring of thyroid function tests.   -Her neck exam is negative for goiter, no need for thyroid imaging for now. Her supraclavicular fullness is due to increased intrathoracic pressure due to asthma/COPD.   -Regarding her concern about his weight: - she acknowledges that there is a room for improvement in her food and drink choices. - Suggestion is made for her to avoid simple carbohydrates  from her diet including Cakes, Sweet Desserts, Ice Cream, Soda (diet and regular), Sweet Tea, Candies, Chips, Cookies, Store Bought Juices, Alcohol in Excess of  1-2 drinks a day, Artificial Sweeteners,  Coffee Creamer, and "Sugar-free" Products, Lemonade. This will help patient to have more stable blood glucose profile and potentially avoid unintended weight gain.   - she is advised to maintain close follow up with Lindell Spar, MD for primary care needs.    I spent 22 minutes in the care of the patient today including review of labs from Thyroid Function, CMP, and other relevant labs  ; imaging/biopsy records (current and previous including abstractions from other facilities); face-to-face time discussing  her lab results and symptoms, medications doses, her options of short and long term treatment based on the latest standards of care / guidelines;   and documenting the encounter.  [name]  participated in the discussions, expressed understanding, and voiced agreement with the above plans.  All questions were answered to her satisfaction. she is encouraged to contact clinic  should she have any questions or concerns prior to her return visit.    Follow up plan: Return in about 6 months (around 09/30/2021) for F/U with Pre-visit Labs.   Glade Lloyd, MD [facility] [address] [place], [place] [address] Phone: [phone]  Fax: [phone]     03/31/2021, 12:40 PM  This note was partially dictated with voice recognition software. Similar sounding words can be transcribed inadequately or may not  be corrected upon review.

## 2021-04-02 ENCOUNTER — Other Ambulatory Visit: Payer: Self-pay

## 2021-04-02 ENCOUNTER — Ambulatory Visit (INDEPENDENT_AMBULATORY_CARE_PROVIDER_SITE_OTHER): Payer: Medicare Other

## 2021-04-02 VITALS — Ht 67.0 in | Wt 203.0 lb

## 2021-04-02 DIAGNOSIS — Z0000 Encounter for general adult medical examination without abnormal findings: Secondary | ICD-10-CM

## 2021-04-02 NOTE — Progress Notes (Signed)
Subjective:   [name] is a 70 y.o. female who presents for Medicare Annual (Subsequent) preventive examination.  Review of Systems       Objective:    There were no vitals filed for this visit. There is no height or weight on file to calculate BMI.  Advanced Directives 03/19/2021 01/12/2020 03/18/2018 07/26/2017 07/23/2017 04/19/2017 06/09/2016  Does Patient Have a Medical Advance Directive? Yes No Yes No Yes Yes Yes  Type of Paramedic of [place];Living will - [place];Living will - Living will;Healthcare Power of Attorney Living will;Healthcare Power of Attorney Living will  Does patient want to make changes to medical advance directive? No - Patient declined - - - Yes (Inpatient - patient requests chaplain consult to change a medical advance directive) - -  Copy of [place] in Chart? Yes - validated most recent copy scanned in chart (See row information) - No - copy requested - Yes Yes Yes  Would patient like information on creating a medical advance directive? - Yes (MAU/Ambulatory/Procedural Areas - Information given) - - - - -  Pre-existing out of facility DNR order (yellow form or pink MOST form) - - - - - - -    Current Medications (verified) Outpatient Encounter Medications as of 04/02/2021  Medication Sig   albuterol (PROVENTIL) (2.5 MG/3ML) 0.083% nebulizer solution Take 3 mLs (2.5 mg total) by nebulization every 4 (four) hours as needed for wheezing or shortness of breath.   albuterol (VENTOLIN HFA) 108 (90 Base) MCG/ACT inhaler Inhale 2 puffs into the lungs every 4 (four) hours as needed for wheezing.   ALPRAZolam (XANAX) 1 MG tablet Take 1 tablet (1 mg total) by mouth 2 (two) times daily as needed for anxiety.   brimonidine (ALPHAGAN) 0.2 % ophthalmic solution Place 1 drop into both eyes 2 (two) times daily.   budesonide-formoterol (SYMBICORT) 160-4.5 MCG/ACT inhaler Inhale 2 puffs into the lungs as  needed.   chlorpheniramine-HYDROcodone (TUSSIONEX PENNKINETIC ER) 10-8 MG/5ML SUER Take 5 mLs by mouth every 12 (twelve) hours as needed.   cyanocobalamin 1000 MCG tablet Take 1,000 mcg by mouth daily.   cyclobenzaprine (FLEXERIL) 10 MG tablet TAKE ONE TABLET BY MOUTH DAILY  as needed muscle spasms   dexlansoprazole (DEXILANT) 60 MG capsule Take 1 capsule (60 mg total) by mouth daily.   dicyclomine (BENTYL) 10 MG capsule Take one capsule before meals and at bedtime AS NEEDED for abdominal pain and frequent stool. HOLD for constipation   Ergocalciferol (VITAMIN D2) 50 MCG (2000 UT) TABS Take 2,000 Units by mouth daily.   estradiol (ESTRACE VAGINAL) 0.1 MG/GM vaginal cream Place 1 Applicatorful vaginally daily as needed.   fenofibrate (TRICOR) 145 MG tablet Take 1 tablet (145 mg total) by mouth daily.   folic acid (FOLVITE) 1 MG tablet Take 1 mg by mouth daily.   HYDROcodone-acetaminophen (NORCO/VICODIN) 5-325 MG tablet Take 1 tablet by mouth every 6 (six) hours as needed for moderate pain.   hyoscyamine (ANASPAZ) 0.125 MG TBDP disintergrating tablet Place 0.125 mg under the tongue as needed.    irbesartan (AVAPRO) 150 MG tablet Take 0.5 tablets (75 mg total) by mouth daily.   latanoprost (XALATAN) 0.005 % ophthalmic solution Place 1 drop into both eyes at bedtime.   levothyroxine (SYNTHROID) 25 MCG tablet Take 1 tablet (25 mcg total) by mouth daily before breakfast.   Lidocaine-Hydrocortisone Ace 3-2.5 % KIT APPLY TO RECTUM QID for 2 weeks then AS NEEDED FOR RECTAL  PAIN OR BLEEDING   Lifitegrast (XIIDRA) 5 % SOLN INSTILL 1 DROP IN BOTH EYES EVERY DAY AS NEEDED (EACH CONTAINER SHOULD YIELD 2 DROPS) STORE IN ORIGINAL FOIL POUCH   magnesium oxide (HM MAGNESIUM) 400 MG tablet Take 2 tablets (800 mg total) by mouth daily.   meclizine (ANTIVERT) 12.5 MG tablet Take 1 tablet (12.5 mg total) by mouth 3 (three) times daily as needed for dizziness.   Menthol, Topical Analgesic, (BIOFREEZE EX) Apply topically  daily as needed (pain).    metroNIDAZOLE (METROCREAM) 0.75 % cream Apply 1 application topically as needed.   naproxen (NAPROSYN) 500 MG tablet Take 500 mg by mouth as needed.   neomycin-polymyxin b-dexamethasone (MAXITROL) 3.5-10000-0.1 OINT Place 1 application into both eyes at bedtime as needed.   nitrofurantoin, macrocrystal-monohydrate, (MACROBID) 100 MG capsule Take 1 capsule (100 mg total) by mouth 2 (two) times daily.   nitroGLYCERIN (NITROLINGUAL) 0.4 MG/SPRAY spray Place 1 spray under the tongue every 5 (five) minutes as needed for chest pain.   NON FORMULARY Take 1 tablet by mouth daily as needed (bloat). FD GUARD   ondansetron (ZOFRAN) 4 MG tablet Take 1 tablet (4 mg total) by mouth every 8 (eight) hours as needed for nausea or vomiting.   polyethylene glycol-electrolytes (TRILYTE) 420 g solution Take 4,000 mLs by mouth as directed.   Probiotic Product (PROBIOTIC DAILY PO) Take 1 capsule by mouth daily.   senna (SENOKOT) 8.6 MG TABS tablet Take 1 tablet by mouth daily as needed.    UNABLE TO FIND Med Name: lidocaine 5% pramoxine 1% ointment per rectum as needed.   zolpidem (AMBIEN) 10 MG tablet TAKE ONE TABLET BY MOUTH DAILY AT BEDTIME.   No facility-administered encounter medications on file as of 04/02/2021.    Allergies (verified) Adhesive [tape], Aspirin, Keflex [cephalexin], Levaquin [levofloxacin in d5w], Other, Statins, Sulfonamide derivatives, and Latex   History: Past Medical History:  Diagnosis Date   Anxiety    Arthritis    Asthma    Cataract    Phreesia 01/05/2021   Chest pain    a. 02/2000 Cath: nl cors, EF 60%;  b. 10/2010 MV: nl LV, no ischemia/infarct;  c. 03/2014 Admit c/p, r/o->grief from husbands death.   Chronic RUQ pain 12-15-07   EUS slightly dilated CBD (7.46m), otherwise nl   Depression    Eczema    Exposure to chemical inhalation mid 1970s   Fatty liver    G6PD deficiency    GERD (gastroesophageal reflux disease)    Glaucoma    History of pulmonary  embolus (PE)    Treated with Eliquis 203/11/16  HTN (hypertension)    Hypercholesteremia    a. intolerant to statins.   IBS (irritable bowel syndrome)    Kidney stone    Legally blind    Palpitations    Recurrent upper respiratory infection (URI)    Renal cyst    Sickle cell trait (HCC)    Tubular adenoma of colon 09/17/2011   Urticaria    Past Surgical History:  Procedure Laterality Date   ABDOMINAL HYSTERECTOMY     ADENOIDECTOMY     ANGIOPLASTY     APPENDECTOMY     BIOPSY N/A 03/14/2013   Procedure: SMALL BOWEL AND GASTRIC BIOPSIES (Procedure #1);  Surgeon: SDanie Binder MD;  Location: [facility];  Service: Endoscopy;  Laterality: N/A;   CARDIAC CATHETERIZATION Left 02/11/2000   CATARACT EXTRACTION Left    CATARACT EXTRACTION W/PHACO Right 03/19/2021   Procedure: CATARACT EXTRACTION PHACO  AND INTRAOCULAR LENS PLACEMENT (IOC) RIGHT 6.75 00:50.4;  Surgeon: Leandrew Koyanagi, MD;  Location: [place];  Service: Ophthalmology;  Laterality: Right;   CHOLECYSTECTOMY  12/2007   COLONOSCOPY  12/22/10   ZOX:WRUEAV, cecal adenomatous polyp   COLONOSCOPY WITH PROPOFOL N/A 03/31/2016   Dr. Oneida Alar: four sessile polyps rectum/sigmoid colon, diverticulosi, ext/int hemorrhoids. hyperplastic polyps, next tcs 5 years.   complete hysterectomy     ENTEROSCOPY N/A 03/14/2013   WUJ:WJXB gastritis/ulcers has healed   ESOPHAGOGASTRODUODENOSCOPY  09/22/2011   JYN:WGNF gastritis/Duodenitis   FLEXIBLE SIGMOIDOSCOPY N/A 03/14/2013   SLF:3 colon polyp removed/moderate sized internal hemorrhoids   FLEXIBLE SIGMOIDOSCOPY N/A 06/09/2016   Procedure: FLEXIBLE SIGMOIDOSCOPY;  Surgeon: Danie Binder, MD;  [facility];  Service: Endoscopy;  Laterality: N/A;  rectal polyps times 2   FLEXIBLE SIGMOIDOSCOPY N/A 03/18/2018   Procedure: FLEXIBLE SIGMOIDOSCOPY;  Surgeon: Danie Binder, MD;  [facility];  Service: Endoscopy;  Laterality: N/A;  9:15am   FOOT SURGERY     bunion removal left    GIVENS CAPSULE STUDY N/A 02/10/2013   Procedure: GIVENS CAPSULE STUDY;  Surgeon: Danie Binder, MD;  [facility];  Service: Endoscopy;  Laterality: N/A;  [place]     right    HEMORRHOID BANDING N/A 03/14/2013   Procedure: HEMORRHOID BANDING (Procedure #3)  3 bands applied [id number] Exp 02/02/2014 ;  Surgeon: Danie Binder, MD;  Location: [facility];  Service: Endoscopy;  Laterality: N/A;   HEMORRHOID SURGERY N/A 04/24/2016   Procedure: EXTENSIVE HEMORRHOIDECTOMY;  Surgeon: Vickie Epley, MD;  Location: [facility];  Service: General;  Laterality: N/A;   LAPAROSCOPY     adhesions   POLYPECTOMY N/A 03/14/2013   ONG:EXBM Gastritis . ULCERS SEEN ON MAY 6 HAVE HEALED   POLYPECTOMY  03/31/2016   Procedure: POLYPECTOMY;  Surgeon: Danie Binder, MD;  [facility];  Service: Endoscopy;;  sigmoid colon polyps x2, rectal polyps x2   TONSILLECTOMY     TUBAL LIGATION     Family History  Problem Relation Age of Onset   Colon cancer Mother        48s   Urticaria Mother    Hypertension Mother    Cancer Mother    Cancer Father        oral   Crohn's disease Sister    Colon cancer Maternal Grandfather    Colon cancer Paternal Grandfather    Diabetes Brother    Colon cancer Maternal Grandmother    Anesthesia problems Neg Hx    Social History   Socioeconomic History   Marital status: Widowed    Spouse name: Not on file   Number of children: Not on file   Years of education: Not on file   Highest education level: Not on file  Occupational History   Occupation: homemaker    Employer: UNEMPLOYED  Tobacco Use   Smoking status: Former    Packs/day: 0.50    Years: 30.00    Pack years: 15.00    Types: Cigarettes   Smokeless tobacco: Former    Quit date: 10/15/1996   Tobacco comments:    Stopped smoking ~ 2000  Vaping Use   Vaping Use: Never used  Substance and Sexual Activity   Alcohol use: Yes    Comment: occassion   Drug use: No   Sexual activity:  Yes    Birth control/protection: Surgical  Other Topics Concern   Not  on file  Social History Narrative   Does not routinely exercise. Husband passed in JUN 2015 due to prostate ca.   Social Determinants of Health   Financial Resource Strain: Not on file  Food Insecurity: Not on file  Transportation Needs: Not on file  Physical Activity: Not on file  Stress: Not on file  Social Connections: Not on file    Tobacco Counseling Counseling given: Not Answered Tobacco comments: Stopped smoking ~ 2000   Clinical Intake:                 Diabetic? no         Activities of Daily Living In your present state of health, do you have any difficulty performing the following activities: 03/19/2021 02/19/2021  Hearing? N N  Vision? N N  Difficulty concentrating or making decisions? N N  Walking or climbing stairs? N N  Dressing or bathing? N N  Doing errands, shopping? - N  Some recent data might be hidden    Patient Care Team: Lindell Spar, MD as PCP - General (Internal Medicine) Satira Sark, MD as PCP - Cardiology (Cardiology) Danie Binder, MD (Inactive) (Gastroenterology)  Indicate any recent Medical Services you may have received from other than [facility] providers in the past year (date may be approximate).     Assessment:   This is a routine wellness examination for [name].  Hearing/Vision screen No results found.  Dietary issues and exercise activities discussed:     Goals Addressed   None   Depression Screen PHQ 2/9 Scores 02/19/2021 01/08/2021 11/25/2020 01/12/2020 06/14/2019 02/15/2018 10/08/2017  PHQ - 2 Score 0 0 0 0 0 0 0  PHQ- 9 Score - - 0 - - - 1    Fall Risk Fall Risk  02/19/2021 01/08/2021 01/12/2020 01/12/2020 01/09/2020  Falls in the past year? 0 0 1 0 0  Comment - - - - -  Number falls in past yr: 0 0 - - -  Comment - - - - -  Injury with Fall? 0 0 0 - -  Comment - - - - -  Risk for fall due to : No Fall Risks History of fall(s);Impaired  balance/gait;Impaired vision - No Fall Risks -  Follow up Falls evaluation completed Falls evaluation completed;Education provided;Falls prevention discussed;Follow up appointment - Falls evaluation completed -    FALL RISK PREVENTION PERTAINING TO THE HOME:  Any stairs in or around the home? Yes  If so, are there any without handrails? No  Home free of loose throw rugs in walkways, pet beds, electrical cords, etc? Yes  Adequate lighting in your home to reduce risk of falls? Yes   ASSISTIVE DEVICES UTILIZED TO PREVENT FALLS:  Life alert? Yes  Use of a cane, walker or w/c? No  Grab bars in the bathroom? Yes  Shower chair or bench in shower? No  Elevated toilet seat or a handicapped toilet? No   TIMED UP AND GO:  Was the test performed? No .  Length of time to ambulate n/a     Cognitive Function:        Immunizations Immunization History  Administered Date(s) Administered   Fluad Quad(high Dose 65+) 06/05/2019, 07/22/2020   Influenza Split 07/12/2012   Influenza,inj,Quad PF,6+ Mos 08/03/2014, 07/19/2015, 08/11/2016, 06/24/2017, 07/04/2018   Moderna Sars-Covid-2 Vaccination 11/16/2019, 12/15/2019, 08/02/2020   Pneumococcal Conjugate-13 02/15/2018   Pneumococcal Polysaccharide-23 08/03/2014, 01/12/2020   Tdap 08/25/2012, 08/03/2014   Zoster Recombinat (Shingrix) 01/16/2020, 03/21/2020  Zoster, Live 08/25/2012    TDAP status: Up to date  Flu Vaccine status: Up to date  Pneumococcal vaccine status: Up to date  Covid-19 vaccine status: Completed vaccines  Qualifies for Shingles Vaccine? Yes   Zostavax completed Yes   Shingrix Completed?: Yes  Screening Tests Health Maintenance  Topic Date Due   COVID-19 Vaccine (4 - Booster for Moderna series) 12/02/2020   INFLUENZA VACCINE  05/05/2021   MAMMOGRAM  06/28/2022   TETANUS/TDAP  08/03/2024   COLONOSCOPY (Pts 45-62yr Insurance coverage will need to be confirmed)  04/01/2026   DEXA SCAN  Completed   Hepatitis C  Screening  Completed   PNA vac Low Risk Adult  Completed   Zoster Vaccines- Shingrix  Completed   HPV VACCINES  Aged Out    Health Maintenance  Health Maintenance Due  Topic Date Due   COVID-19 Vaccine (4 - Booster for Moderna series) 12/02/2020    Colorectal cancer screening: Type of screening: Colonoscopy. Completed 04/01/16. Repeat every 5 years  Mammogram status: Completed 06/28/20. Repeat every year  Bone Density status: Completed 05/18/18. Results reflect: Bone density results: NORMAL. Repeat every 5 years.  Lung Cancer Screening: (Low Dose CT Chest recommended if Age 473-80years, 30 pack-year currently smoking OR have quit w/in 15years.) does not qualify.   Lung Cancer Screening Referral: n/a  Additional Screening:  Hepatitis C Screening: does not qualify; Completed  Vision Screening: Recommended annual ophthalmology exams for early detection of glaucoma and other disorders of the eye. Is the patient up to date with their annual eye exam?  Yes  Who is the provider or what is the name of the office in which the patient attends annual eye exams? [place] is not established with a provider, would they like to be referred to a provider to establish care?  N/a .   Dental Screening: Recommended annual dental exams for proper oral hygiene  Community Resource Referral / Chronic Care Management: CRR required this visit?  No   CCM required this visit?  No      Plan:     I have personally reviewed and noted the following in the patient's chart:   Medical and social history Use of alcohol, tobacco or illicit drugs  Current medications and supplements including opioid prescriptions.  Functional ability and status Nutritional status Physical activity Advanced directives List of other physicians Hospitalizations, surgeries, and ER visits in previous 12 months Vitals Screenings to include cognitive, depression, and falls Referrals and appointments  In  addition, I have reviewed and discussed with patient certain preventive protocols, quality metrics, and best practice recommendations. A written personalized care plan for preventive services as well as general preventive health recommendations were provided to patient.     BLaretta Bolster [place]  [id number]  Nurse Notes: AWV conducted by nurse in office by phone. Patient gave consent to telehealth visit via audio. Patient at home at the time of this visit. Provider at home at the time of this visit. Visit took 35 minutes to complete.

## 2021-04-02 NOTE — Patient Instructions (Addendum)
[name] , Thank you for taking time to come for your Medicare Wellness Visit. I appreciate your ongoing commitment to your health goals. Please review the following plan we discussed and let me know if I can assist you in the future.   Screening recommendations/referrals: Colonoscopy: Scheduled Mammogram: 06/28/21 Bone Density: Complete Recommended yearly ophthalmology/optometry visit for glaucoma screening and checkup Recommended yearly dental visit for hygiene and checkup  Vaccinations: Influenza vaccine: Fall 2022 Pneumococcal vaccine: Complete Tdap vaccine: 08/03/24 Shingles vaccine: Complete    Advanced directives: POA and Living Will  Conditions/risks identified: None  Next appointment: 05/13/21 @ 1 pm. AWV in 1 year.   Preventive Care 68 Years and Older, Female Preventive care refers to lifestyle choices and visits with your health care provider that can promote health and wellness. What does preventive care include? A yearly physical exam. This is also called an annual well check. Dental exams once or twice a year. Routine eye exams. Ask your health care provider how often you should have your eyes checked. Personal lifestyle choices, including: Daily care of your teeth and gums. Regular physical activity. Eating a healthy diet. Avoiding tobacco and drug use. Limiting alcohol use. Practicing safe sex. Taking low-dose aspirin every day. Taking vitamin and mineral supplements as recommended by your health care provider. What happens during an annual well check? The services and screenings done by your health care provider during your annual well check will depend on your age, overall health, lifestyle risk factors, and family history of disease. Counseling  Your health care provider may ask you questions about your: Alcohol use. Tobacco use. Drug use. Emotional well-being. Home and relationship well-being. Sexual activity. Eating habits. History of  falls. Memory and ability to understand (cognition). Work and work Statistician. Reproductive health. Screening  You may have the following tests or measurements: Height, weight, and BMI. Blood pressure. Lipid and cholesterol levels. These may be checked every 5 years, or more frequently if you are over 59 years old. Skin check. Lung cancer screening. You may have this screening every year starting at age 50 if you have a 30-pack-year history of smoking and currently smoke or have quit within the past 15 years. Fecal occult blood test (FOBT) of the stool. You may have this test every year starting at age 80. Flexible sigmoidoscopy or colonoscopy. You may have a sigmoidoscopy every 5 years or a colonoscopy every 10 years starting at age 58. Hepatitis C blood test. Hepatitis B blood test. Sexually transmitted disease (STD) testing. Diabetes screening. This is done by checking your blood sugar (glucose) after you have not eaten for a while (fasting). You may have this done every 1-3 years. Bone density scan. This is done to screen for osteoporosis. You may have this done starting at age 79. Mammogram. This may be done every 1-2 years. Talk to your health care provider about how often you should have regular mammograms. Talk with your health care provider about your test results, treatment options, and if necessary, the need for more tests. Vaccines  Your health care provider may recommend certain vaccines, such as: Influenza vaccine. This is recommended every year. Tetanus, diphtheria, and acellular pertussis (Tdap, Td) vaccine. You may need a Td booster every 10 years. Zoster vaccine. You may need this after age 24. Pneumococcal 13-valent conjugate (PCV13) vaccine. One dose is recommended after age 54. Pneumococcal polysaccharide (PPSV23) vaccine. One dose is recommended after age 63. Talk to your health care provider about which screenings and vaccines you  need and how often you need  them. This information is not intended to replace advice given to you by your health care provider. Make sure you discuss any questions you have with your health care provider. Document Released: 10/18/2015 Document Revised: 06/10/2016 Document Reviewed: 07/23/2015 Elsevier Interactive Patient Education  2017 [place] Prevention in the Home Falls can cause injuries. They can happen to people of all ages. There are many things you can do to make your home safe and to help prevent falls. What can I do on the outside of my home? Regularly fix the edges of walkways and driveways and fix any cracks. Remove anything that might make you trip as you walk through a door, such as a raised step or threshold. Trim any bushes or trees on the path to your home. Use bright outdoor lighting. Clear any walking paths of anything that might make someone trip, such as rocks or tools. Regularly check to see if handrails are loose or broken. Make sure that both sides of any steps have handrails. Any raised decks and porches should have guardrails on the edges. Have any leaves, snow, or ice cleared regularly. Use sand or salt on walking paths during winter. Clean up any spills in your garage right away. This includes oil or grease spills. What can I do in the bathroom? Use night lights. Install grab bars by the toilet and in the tub and shower. Do not use towel bars as grab bars. Use non-skid mats or decals in the tub or shower. If you need to sit down in the shower, use a plastic, non-slip stool. Keep the floor dry. Clean up any water that spills on the floor as soon as it happens. Remove soap buildup in the tub or shower regularly. Attach bath mats securely with double-sided non-slip rug tape. Do not have throw rugs and other things on the floor that can make you trip. What can I do in the bedroom? Use night lights. Make sure that you have a light by your bed that is easy to reach. Do not use  any sheets or blankets that are too big for your bed. They should not hang down onto the floor. Have a firm chair that has side arms. You can use this for support while you get dressed. Do not have throw rugs and other things on the floor that can make you trip. What can I do in the kitchen? Clean up any spills right away. Avoid walking on wet floors. Keep items that you use a lot in easy-to-reach places. If you need to reach something above you, use a strong step stool that has a grab bar. Keep electrical cords out of the way. Do not use floor polish or wax that makes floors slippery. If you must use wax, use non-skid floor wax. Do not have throw rugs and other things on the floor that can make you trip. What can I do with my stairs? Do not leave any items on the stairs. Make sure that there are handrails on both sides of the stairs and use them. Fix handrails that are broken or loose. Make sure that handrails are as long as the stairways. Check any carpeting to make sure that it is firmly attached to the stairs. Fix any carpet that is loose or worn. Avoid having throw rugs at the top or bottom of the stairs. If you do have throw rugs, attach them to the floor with carpet tape. Make sure that  you have a light switch at the top of the stairs and the bottom of the stairs. If you do not have them, ask someone to add them for you. What else can I do to help prevent falls? Wear shoes that: Do not have high heels. Have rubber bottoms. Are comfortable and fit you well. Are closed at the toe. Do not wear sandals. If you use a stepladder: Make sure that it is fully opened. Do not climb a closed stepladder. Make sure that both sides of the stepladder are locked into place. Ask someone to hold it for you, if possible. Clearly mark and make sure that you can see: Any grab bars or handrails. First and last steps. Where the edge of each step is. Use tools that help you move around (mobility aids)  if they are needed. These include: Canes. Walkers. Scooters. Crutches. Turn on the lights when you go into a dark area. Replace any light bulbs as soon as they burn out. Set up your furniture so you have a clear path. Avoid moving your furniture around. If any of your floors are uneven, fix them. If there are any pets around you, be aware of where they are. Review your medicines with your doctor. Some medicines can make you feel dizzy. This can increase your chance of falling. Ask your doctor what other things that you can do to help prevent falls. This information is not intended to replace advice given to you by your health care provider. Make sure you discuss any questions you have with your health care provider. Document Released: 07/18/2009 Document Revised: 02/27/2016 Document Reviewed: 10/26/2014 Elsevier Interactive Patient Education  2017 [employer].

## 2021-04-15 NOTE — Patient Instructions (Signed)
[name]  04/15/2021     @PREFPERIOPPHARMACY @   Your procedure is scheduled on  04/21/2021.   Report to [facility] at  0730  A.M.   Call this number if you have problems the morning of surgery:  [phone]   Remember:  Follow the diet and prep instructions given to you by the office.    Take these medicines the morning of surgery with A SIP OF WATER      dexilant, hydrocodone (if needed), anaspaz (if needed), levothyroxine, antivert (if needed), zofran (if needed).   Use your nebulizer and your inhalers before you come and bring your rescue inhaler with you.    Do not wear jewelry, make-up or nail polish.  Do not wear lotions, powders, or perfumes, or deodorant.  Do not shave 48 hours prior to surgery.  Men may shave face and neck.  Do not bring valuables to the hospital.  [facility] is not responsible for any belongings or valuables.  Contacts, dentures or bridgework may not be worn into surgery.  Leave your suitcase in the car.  After surgery it may be brought to your room.  For patients admitted to the hospital, discharge time will be determined by your treatment team.  Patients discharged the day of surgery will not be allowed to drive home and must have someone with them for 24 hours.    Special instructions:    DO NOT smoke tobacco or vape for 24 hours before your procedure.  Please read over the following fact sheets that you were given. Anesthesia Post-op Instructions and Care and Recovery After Surgery      Colonoscopy, Adult, Care After This sheet gives you information about how to care for yourself after your procedure. Your health care provider may also give you more specific instructions. If you have problems or questions, contact your health careprovider. What can I expect after the procedure? After the procedure, it is common to have: A small amount of blood in your stool for 24 hours after the procedure. Some gas. Mild cramping or  bloating of your abdomen. Follow these instructions at home: Eating and drinking  Drink enough fluid to keep your urine pale yellow. Follow instructions from your health care provider about eating or drinking restrictions. Resume your normal diet as instructed by your health care provider. Avoid heavy or fried foods that are hard to digest.  Activity Rest as told by your health care provider. Avoid sitting for a long time without moving. Get up to take short walks every 1-2 hours. This is important to improve blood flow and breathing. Ask for help if you feel weak or unsteady. Return to your normal activities as told by your health care provider. Ask your health care provider what activities are safe for you. Managing cramping and bloating  Try walking around when you have cramps or feel bloated. Apply heat to your abdomen as told by your health care provider. Use the heat source that your health care provider recommends, such as a moist heat pack or a heating pad. Place a towel between your skin and the heat source. Leave the heat on for 20-30 minutes. Remove the heat if your skin turns bright red. This is especially important if you are unable to feel pain, heat, or cold. You may have a greater risk of getting burned.  General instructions If you were given a sedative during the procedure, it can affect you for several hours.  Do not drive or operate machinery until your health care provider says that it is safe. For the first 24 hours after the procedure: Do not sign important documents. Do not drink alcohol. Do your regular daily activities at a slower pace than normal. Eat soft foods that are easy to digest. Take over-the-counter and prescription medicines only as told by your health care provider. Keep all follow-up visits as told by your health care provider. This is important. Contact a health care provider if: You have blood in your stool 2-3 days after the procedure. Get help  right away if you have: More than a small spotting of blood in your stool. Large blood clots in your stool. Swelling of your abdomen. Nausea or vomiting. A fever. Increasing pain in your abdomen that is not relieved with medicine. Summary After the procedure, it is common to have a small amount of blood in your stool. You may also have mild cramping and bloating of your abdomen. If you were given a sedative during the procedure, it can affect you for several hours. Do not drive or operate machinery until your health care provider says that it is safe. Get help right away if you have a lot of blood in your stool, nausea or vomiting, a fever, or increased pain in your abdomen. This information is not intended to replace advice given to you by your health care provider. Make sure you discuss any questions you have with your healthcare provider. Document Revised: 09/15/2019 Document Reviewed: 04/17/2019 Elsevier Patient Education  [place] After This sheet gives you information about how to care for yourself after your procedure. Your health care provider may also give you more specific instructions. If you have problems or questions, contact your health careprovider. What can I expect after the procedure? After the procedure, it is common to have: Tiredness. Forgetfulness about what happened after the procedure. Impaired judgment for important decisions. Nausea or vomiting. Some difficulty with balance. Follow these instructions at home: For the time period you were told by your health care provider:     Rest as needed. Do not participate in activities where you could fall or become injured. Do not drive or use machinery. Do not drink alcohol. Do not take sleeping pills or medicines that cause drowsiness. Do not make important decisions or sign legal documents. Do not take care of children on your own. Eating and drinking Follow the diet  that is recommended by your health care provider. Drink enough fluid to keep your urine pale yellow. If you vomit: Drink water, juice, or soup when you can drink without vomiting. Make sure you have little or no nausea before eating solid foods. General instructions Have a responsible adult stay with you for the time you are told. It is important to have someone help care for you until you are awake and alert. Take over-the-counter and prescription medicines only as told by your health care provider. If you have sleep apnea, surgery and certain medicines can increase your risk for breathing problems. Follow instructions from your health care provider about wearing your sleep device: Anytime you are sleeping, including during daytime naps. While taking prescription pain medicines, sleeping medicines, or medicines that make you drowsy. Avoid smoking. Keep all follow-up visits as told by your health care provider. This is important. Contact a health care provider if: You keep feeling nauseous or you keep vomiting. You feel light-headed. You are still sleepy or having trouble with balance  after 24 hours. You develop a rash. You have a fever. You have redness or swelling around the IV site. Get help right away if: You have trouble breathing. You have new-onset confusion at home. Summary For several hours after your procedure, you may feel tired. You may also be forgetful and have poor judgment. Have a responsible adult stay with you for the time you are told. It is important to have someone help care for you until you are awake and alert. Rest as told. Do not drive or operate machinery. Do not drink alcohol or take sleeping pills. Get help right away if you have trouble breathing, or if you suddenly become confused. This information is not intended to replace advice given to you by your health care provider. Make sure you discuss any questions you have with your healthcare provider. Document  Revised: 06/06/2020 Document Reviewed: 08/24/2019 Elsevier Patient Education  2022 [employer].

## 2021-04-17 ENCOUNTER — Encounter (HOSPITAL_COMMUNITY)
Admission: RE | Admit: 2021-04-17 | Discharge: 2021-04-17 | Disposition: A | Discharge status: Home / Self Care | Payer: Medicare Other | Source: Ambulatory Visit | Attending: Internal Medicine | Admitting: Internal Medicine

## 2021-04-17 ENCOUNTER — Telehealth: Payer: Self-pay | Admitting: Internal Medicine

## 2021-04-17 ENCOUNTER — Encounter (HOSPITAL_COMMUNITY): Payer: Self-pay

## 2021-04-17 ENCOUNTER — Other Ambulatory Visit: Payer: Self-pay

## 2021-04-17 HISTORY — DX: Hypothyroidism, unspecified: E03.9

## 2021-04-17 MED ORDER — PEG 3350-KCL-NA BICARB-NACL 420 G PO SOLR: 4000.0000 mL | ORAL | 0 refills | Status: DC

## 2021-04-17 NOTE — Telephone Encounter (Signed)
Rx resent in

## 2021-04-17 NOTE — Telephone Encounter (Signed)
Pt mixed her prep too soon and needs another sent to [facility]. Her procedure is Monday.

## 2021-04-21 ENCOUNTER — Ambulatory Visit (HOSPITAL_COMMUNITY): Payer: Medicare Other | Admitting: Anesthesiology

## 2021-04-21 ENCOUNTER — Telehealth: Payer: Self-pay | Admitting: Internal Medicine

## 2021-04-21 ENCOUNTER — Encounter (HOSPITAL_COMMUNITY)
Admission: RE | Disposition: A | Discharge status: Home / Self Care | Payer: Self-pay | Source: Home / Self Care | Attending: Internal Medicine

## 2021-04-21 ENCOUNTER — Ambulatory Visit (HOSPITAL_COMMUNITY)
Admission: RE | Admit: 2021-04-21 | Discharge: 2021-04-21 | Disposition: A | Discharge status: Home / Self Care | Payer: Medicare Other | Attending: Internal Medicine | Admitting: Internal Medicine

## 2021-04-21 ENCOUNTER — Other Ambulatory Visit: Payer: Self-pay

## 2021-04-21 DIAGNOSIS — Z79899 Other long term (current) drug therapy: Secondary | ICD-10-CM | POA: Insufficient documentation

## 2021-04-21 DIAGNOSIS — K589 Irritable bowel syndrome without diarrhea: Secondary | ICD-10-CM | POA: Diagnosis not present

## 2021-04-21 DIAGNOSIS — K602 Anal fissure, unspecified: Secondary | ICD-10-CM | POA: Diagnosis not present

## 2021-04-21 DIAGNOSIS — Z800 Family history of malignant neoplasm of digestive organs: Secondary | ICD-10-CM | POA: Insufficient documentation

## 2021-04-21 DIAGNOSIS — Z8379 Family history of other diseases of the digestive system: Secondary | ICD-10-CM | POA: Diagnosis not present

## 2021-04-21 DIAGNOSIS — K648 Other hemorrhoids: Secondary | ICD-10-CM | POA: Diagnosis not present

## 2021-04-21 DIAGNOSIS — K219 Gastro-esophageal reflux disease without esophagitis: Secondary | ICD-10-CM | POA: Insufficient documentation

## 2021-04-21 DIAGNOSIS — D124 Benign neoplasm of descending colon: Secondary | ICD-10-CM | POA: Diagnosis not present

## 2021-04-21 DIAGNOSIS — K635 Polyp of colon: Secondary | ICD-10-CM | POA: Diagnosis not present

## 2021-04-21 DIAGNOSIS — K5730 Diverticulosis of large intestine without perforation or abscess without bleeding: Secondary | ICD-10-CM | POA: Insufficient documentation

## 2021-04-21 DIAGNOSIS — Z1211 Encounter for screening for malignant neoplasm of colon: Secondary | ICD-10-CM | POA: Diagnosis not present

## 2021-04-21 DIAGNOSIS — Z86010 Personal history of colonic polyps: Secondary | ICD-10-CM | POA: Diagnosis not present

## 2021-04-21 DIAGNOSIS — D122 Benign neoplasm of ascending colon: Secondary | ICD-10-CM | POA: Insufficient documentation

## 2021-04-21 DIAGNOSIS — Z87891 Personal history of nicotine dependence: Secondary | ICD-10-CM | POA: Insufficient documentation

## 2021-04-21 DIAGNOSIS — E039 Hypothyroidism, unspecified: Secondary | ICD-10-CM | POA: Diagnosis not present

## 2021-04-21 HISTORY — PX: POLYPECTOMY: SHX5525

## 2021-04-21 HISTORY — PX: COLONOSCOPY WITH PROPOFOL: SHX5780

## 2021-04-21 SURGERY — COLONOSCOPY WITH PROPOFOL
Anesthesia: General

## 2021-04-21 MED ORDER — LIDOCAINE-HYDROCORTISONE ACE 3-2.5 % RE KIT: PACK | RECTAL | 11 refills | Status: DC

## 2021-04-21 MED ORDER — LIDOCAINE VISCOUS HCL 2 % MT SOLN
OROMUCOSAL | Status: AC
  Filled 2021-04-21: qty 15

## 2021-04-21 MED ORDER — LIDOCAINE HCL URETHRAL/MUCOSAL 2 % EX GEL
CUTANEOUS | Status: DC | PRN
  Administered 2021-04-21: 1 via TOPICAL

## 2021-04-21 MED ORDER — LIDOCAINE HCL (CARDIAC) PF 100 MG/5ML IV SOSY
PREFILLED_SYRINGE | INTRAVENOUS | Status: DC | PRN
  Administered 2021-04-21: 40 mg via INTRAVENOUS

## 2021-04-21 MED ORDER — PROPOFOL 10 MG/ML IV BOLUS
INTRAVENOUS | Status: DC | PRN
Start: 2021-04-21 — End: 2021-04-21
  Administered 2021-04-21: 100 mg via INTRAVENOUS
  Administered 2021-04-21: 125 ug/kg/min via INTRAVENOUS

## 2021-04-21 MED ORDER — STERILE WATER FOR IRRIGATION IR SOLN
Status: DC | PRN
  Administered 2021-04-21: 100 mL

## 2021-04-21 MED ORDER — LIDOCAINE HCL URETHRAL/MUCOSAL 2 % EX GEL
CUTANEOUS | Status: AC
  Filled 2021-04-21: qty 30

## 2021-04-21 MED ORDER — LACTATED RINGERS IV SOLN: INTRAVENOUS | Status: DC

## 2021-04-21 NOTE — Anesthesia Preprocedure Evaluation (Signed)
Anesthesia Evaluation  Patient identified by MRN, date of birth, ID band Patient awake    Reviewed: Allergy & Precautions, NPO status , Patient's Chart, lab work & pertinent test results  History of Anesthesia Complications (+) PONV and history of anesthetic complications  Airway Mallampati: II  TM Distance: >3 FB Neck ROM: Full    Dental  (+) Dental Advisory Given, Teeth Intact   Pulmonary asthma , Recent URI , former smoker, PE   Pulmonary exam normal breath sounds clear to auscultation       Cardiovascular hypertension, Pt. on medications Normal cardiovascular exam Rhythm:Regular Rate:Normal     Neuro/Psych PSYCHIATRIC DISORDERS Anxiety Depression  Neuromuscular disease    GI/Hepatic GERD  Medicated,(+) Hepatitis -  Endo/Other  Hypothyroidism   Renal/GU Renal InsufficiencyRenal disease     Musculoskeletal  (+) Arthritis  (chronic back pain),   Abdominal   Peds  Hematology   Anesthesia Other Findings G6 PD deficiency  Primary open angle glaucoma   Reproductive/Obstetrics                            Anesthesia Physical Anesthesia Plan  ASA: 3  Anesthesia Plan: General   Post-op Pain Management:    Induction: Intravenous  PONV Risk Score and Plan: Propofol infusion  Airway Management Planned: Nasal Cannula and Natural Airway  Additional Equipment:   Intra-op Plan:   Post-operative Plan:   Informed Consent: I have reviewed the patients History and Physical, chart, labs and discussed the procedure including the risks, benefits and alternatives for the proposed anesthesia with the patient or authorized representative who has indicated his/her understanding and acceptance.     Dental advisory given  Plan Discussed with: CRNA and Surgeon  Anesthesia Plan Comments:        Anesthesia Quick Evaluation

## 2021-04-21 NOTE — Telephone Encounter (Signed)
Referral placed.

## 2021-04-21 NOTE — Discharge Instructions (Addendum)
  Colonoscopy Discharge Instructions  Read the instructions outlined below and refer to this sheet in the next few weeks. These discharge instructions provide you with general information on caring for yourself after you leave the hospital. Your doctor may also give you specific instructions. While your treatment has been planned according to the most current medical practices available, unavoidable complications occasionally occur.   ACTIVITY You may resume your regular activity, but move at a slower pace for the next 24 hours.  Take frequent rest periods for the next 24 hours.  Walking will help get rid of the air and reduce the bloated feeling in your belly (abdomen).  No driving for 24 hours (because of the medicine (anesthesia) used during the test).   Do not sign any important legal documents or operate any machinery for 24 hours (because of the anesthesia used during the test).  NUTRITION Drink plenty of fluids.  You may resume your normal diet as instructed by your doctor.  Begin with a light meal and progress to your normal diet. Heavy or fried foods are harder to digest and may make you feel sick to your stomach (nauseated).  Avoid alcoholic beverages for 24 hours or as instructed.  MEDICATIONS You may resume your normal medications unless your doctor tells you otherwise.  WHAT YOU CAN EXPECT TODAY Some feelings of bloating in the abdomen.  Passage of more gas than usual.  Spotting of blood in your stool or on the toilet paper.  IF YOU HAD POLYPS REMOVED DURING THE COLONOSCOPY: No aspirin products for 7 days or as instructed.  No alcohol for 7 days or as instructed.  Eat a soft diet for the next 24 hours.  FINDING OUT THE RESULTS OF YOUR TEST Not all test results are available during your visit. If your test results are not back during the visit, make an appointment with your caregiver to find out the results. Do not assume everything is normal if you have not heard from your  caregiver or the medical facility. It is important for you to follow up on all of your test results.  SEEK IMMEDIATE MEDICAL ATTENTION IF: You have more than a spotting of blood in your stool.  Your belly is swollen (abdominal distention).  You are nauseated or vomiting.  You have a temperature over 101.  You have abdominal pain or discomfort that is severe or gets worse throughout the day.   Your colonoscopy revealed 3 polyp(s) which I removed successfully. Await pathology results, my office will contact you. I recommend repeating colonoscopy in 5 years for surveillance purposes. You also have diverticulosis and internal hemorrhoids. I would recommend increasing fiber in your diet or adding OTC Benefiber/Metamucil. Be sure to drink at least 4 to 6 glasses of water daily.   Your anal fissure was again observed today. I will refill your topical NTG cream and send to [facility]. We will send you home with lidocaine jelly to use for pain. We can consider re evaluation with surgery in the future if continues to not heal. Follow up with GI in 3 months.     I hope you have a great rest of your week!  Elon Alas. Abbey Chatters, D.O. Gastroenterology and Hepatology [facility]

## 2021-04-21 NOTE — Anesthesia Postprocedure Evaluation (Signed)
Anesthesia Post Note  Patient: [name]  Procedure(s) Performed: COLONOSCOPY WITH PROPOFOL POLYPECTOMY  Patient location during evaluation: Endoscopy Anesthesia Type: General Level of consciousness: awake and alert and oriented Pain management: pain level controlled Vital Signs Assessment: post-procedure vital signs reviewed and stable Respiratory status: spontaneous breathing and respiratory function stable Cardiovascular status: blood pressure returned to baseline and stable Postop Assessment: no apparent nausea or vomiting Anesthetic complications: no   No notable events documented.   Last Vitals:  Vitals:   04/21/21 0754 04/21/21 0905  BP: 117/89 (!) 97/44  Pulse: 81 76  Resp: 17 14  Temp: 37.1 C 36.9 C  SpO2: 98% 98%    Last Pain:  Vitals:   04/21/21 0905  TempSrc: Oral  PainSc: 0-No pain                 Serapio Edelson C Tytionna Cloyd

## 2021-04-21 NOTE — Op Note (Signed)
[facility] Patient Name: [name] Procedure Date: 04/21/2021 8:26 AM MRN: [id number] Date of Birth: 1951-06-28 Attending MD: Elon Alas. Abbey Chatters DO CSN: [id number] Age: 70 Admit Type: Outpatient Procedure:                Colonoscopy Indications:              Screening in patient at increased risk: Family                            history of 1st-degree relative with colorectal                            cancer, Screening patient at increased risk: Family                            history of colorectal cancer in multiple 1st-degree                            relatives Providers:                Elon Alas. Abbey Chatters, DO, Tammy Vaught, RN, Randa Spike, Technician Referring MD:              Medicines:                See the Anesthesia note for documentation of the                            administered medications Complications:            No immediate complications. Estimated Blood Loss:     Estimated blood loss was minimal. Procedure:                Pre-Anesthesia Assessment:                           - The anesthesia plan was to use monitored                            anesthesia care (MAC).                           After obtaining informed consent, the colonoscope                            was passed under direct vision. Throughout the                            procedure, the patient's blood pressure, pulse, and                            oxygen saturations were monitored continuously. The                            [id number]-[id number] ([id number]) was introduced through the  anus and advanced to the the cecum, identified by                            appendiceal orifice and ileocecal valve. The                            colonoscopy was performed without difficulty. The                            patient tolerated the procedure well. The quality                            of the bowel preparation was evaluated using the                             BBPS [facility] Bowel Preparation Scale) with scores                            of: Right Colon = 3, Transverse Colon = 3 and Left                            Colon = 3 (entire mucosa seen well with no residual                            staining, small fragments of stool or opaque                            liquid). The total BBPS score equals 9. Scope In: 9:32:35 AM Scope Out: 8:58:02 AM Scope Withdrawal Time: 0 hours 10 minutes 16 seconds  Total Procedure Duration: 0 hours 11 minutes 45 seconds  Findings:      An anal fissure was found on perianal exam.      Non-bleeding internal hemorrhoids were found during endoscopy.      Multiple small and large-mouthed diverticula were found in the right       colon.      A 2 mm polyp was found in the ascending colon. The polyp was sessile.       The polyp was removed with a cold biopsy forceps. Resection and       retrieval were complete.      Three sessile polyps were found in the descending colon. The polyps were       4 to 6 mm in size. These polyps were removed with a cold snare.       Resection and retrieval were complete. Impression:               - Anal fissure found on perianal exam.                           - Non-bleeding internal hemorrhoids.                           - Diverticulosis in the right colon.                           -  One 2 mm polyp in the ascending colon, removed                            with a cold biopsy forceps. Resected and retrieved.                           - Three 4 to 6 mm polyps in the descending colon,                            removed with a cold snare. Resected and retrieved. Moderate Sedation:      Per Anesthesia Care Recommendation:           - Patient has a contact number available for                            emergencies. The signs and symptoms of potential                            delayed complications were discussed with the                            patient. Return to  normal activities tomorrow.                            Written discharge instructions were provided to the                            patient.                           - Resume previous diet.                           - Continue present medications.                           - Await pathology results.                           - Repeat colonoscopy in 5 years for surveillance.                           - Return to GI clinic in 3 months.                           - Will refill NTG cream for anal fissure                           - Lidocaine 2% jelly used pre and post colonoscopy.                            Will send remaining home with patient to use as                            needed. Procedure  Code(s):        --- Professional ---                           [phone], Colonoscopy, flexible; with removal of                            tumor(s), polyp(s), or other lesion(s) by snare                            technique                           [id number], 59, Colonoscopy, flexible; with biopsy,                            single or multiple Diagnosis Code(s):        --- Professional ---                           K60.2, Anal fissure, unspecified                           K64.8, Other hemorrhoids                           K63.5, Polyp of colon                           Z80.0, Family history of malignant neoplasm of                            digestive organs                           K57.30, Diverticulosis of large intestine without                            perforation or abscess without bleeding CPT copyright 2019 American Medical Association. All rights reserved. The codes documented in this report are preliminary and upon coder review may  be revised to meet current compliance requirements. Elon Alas. Abbey Chatters, DO [place] Abbey Chatters, DO 04/21/2021 9:01:57 AM This report has been signed electronically. Number of Addenda: 0

## 2021-04-21 NOTE — Transfer of Care (Signed)
Immediate Anesthesia Transfer of Care Note  Patient: [name]  Procedure(s) Performed: COLONOSCOPY WITH PROPOFOL POLYPECTOMY  Patient Location: Short Stay  Anesthesia Type:General  Level of Consciousness: awake, alert , oriented and patient cooperative  Airway & Oxygen Therapy: Patient Spontanous Breathing  Post-op Assessment: Report given to RN, Post -op Vital signs reviewed and stable and Patient moving all extremities X 4  Post vital signs: Reviewed and stable  Last Vitals:  Vitals Value Taken Time  BP    Temp    Pulse    Resp    SpO2      Last Pain:  Vitals:   04/21/21 0841  TempSrc:   PainSc: 0-No pain         Complications: No notable events documented.

## 2021-04-21 NOTE — Telephone Encounter (Signed)
Can we please refer this patient to Dr. Leighton Ruff at [facility] in [place] for chronic anal fissure? Thank you      Note

## 2021-04-21 NOTE — H&P (Signed)
Primary Care Physician:  Lindell Spar, MD Primary Gastroenterologist:  Dr. Abbey Chatters  Pre-Procedure History & Physical: HPI:  [name] is a 70 y.o. female is here for a colonoscopy for high risk colon cancer screening purposes due to family history of colon cancer.  Last colonoscopy in 11/01/2015 with hyperplastic polyps but has personal history of adenomas and also mother with history of colon cancer and multiple non-first degree relatives with colon cancer.   Past Medical History:  Diagnosis Date   Anxiety    Arthritis    Asthma    Cataract    Phreesia 01/05/2021   Chest pain    a. 02/2000 Cath: nl cors, EF 60%;  b. 2010-10-31 MV: nl LV, no ischemia/infarct;  c. 03/2014 Admit c/p, r/o->grief from husbands death.   Chronic RUQ pain November 01, 2007   EUS slightly dilated CBD (7.78m), otherwise nl   Depression    Eczema    Exposure to chemical inhalation mid 1970s   Fatty liver    G6PD deficiency    GERD (gastroesophageal reflux disease)    Glaucoma    History of pulmonary embolus (PE)    Treated with Eliquis 2January 27, 2016  HTN (hypertension)    Hypercholesteremia    a. intolerant to statins.   Hypothyroidism    IBS (irritable bowel syndrome)    Kidney stone    Legally blind    Palpitations    PONV (postoperative nausea and vomiting)    Recurrent upper respiratory infection (URI)    Renal cyst    Sickle cell trait (HCC)    Tubular adenoma of colon 09/17/2011   Urticaria     Past Surgical History:  Procedure Laterality Date   ABDOMINAL HYSTERECTOMY     ADENOIDECTOMY     ANGIOPLASTY     APPENDECTOMY     BIOPSY N/A 03/14/2013   Procedure: SMALL BOWEL AND GASTRIC BIOPSIES (Procedure #1);  Surgeon: SDanie Binder MD;  Location: [facility];  Service: Endoscopy;  Laterality: N/A;   CARDIAC CATHETERIZATION Left 02/11/2000   CATARACT EXTRACTION Left    CATARACT EXTRACTION W/PHACO Right 03/19/2021   Procedure: CATARACT EXTRACTION PHACO AND INTRAOCULAR LENS PLACEMENT ([place] RIGHT 6.75 00:50.4;   Surgeon: BLeandrew Koyanagi MD;  [place]  Service: Ophthalmology;  Laterality: Right;   CHOLECYSTECTOMY  12/2007   COLONOSCOPY  12/22/2010   RTML:YYTKPT cecal adenomatous polyp   COLONOSCOPY WITH PROPOFOL N/A 03/31/2016   Dr. FOneida Alar four sessile polyps rectum/sigmoid colon, diverticulosi, ext/int hemorrhoids. hyperplastic polyps, next tcs 5 years.   complete hysterectomy     ENTEROSCOPY N/A 03/14/2013   SWSF:KCLEgastritis/ulcers has healed   ESOPHAGOGASTRODUODENOSCOPY  09/22/2011   SXNT:ZGYFgastritis/Duodenitis   EXCISIONAL HEMORRHOIDECTOMY     FLEXIBLE SIGMOIDOSCOPY N/A 03/14/2013   SLF:3 colon polyp removed/moderate sized internal hemorrhoids   FLEXIBLE SIGMOIDOSCOPY N/A 06/09/2016   Procedure: FLEXIBLE SIGMOIDOSCOPY;  Surgeon: SDanie Binder MD;  [facility];  Service: Endoscopy;  Laterality: N/A;  rectal polyps times 2   FLEXIBLE SIGMOIDOSCOPY N/A 03/18/2018   Procedure: FLEXIBLE SIGMOIDOSCOPY;  Surgeon: FDanie Binder MD;  [facility];  Service: Endoscopy;  Laterality: N/A;  9:15am   FOOT SURGERY     bunion removal left   GIVENS CAPSULE STUDY N/A 02/10/2013   Procedure: GIVENS CAPSULE STUDY;  Surgeon: SDanie Binder MD;  [facility];  Service: Endoscopy;  Laterality: N/A;  [place]    right  HEMORRHOID BANDING N/A 03/14/2013   Procedure: HEMORRHOID BANDING (Procedure #3)  3 bands applied GGY#[id number] Exp 02/02/2014 ;  Surgeon: Danie Binder, MD;  Location: [facility];  Service: Endoscopy;  Laterality: N/A;   HEMORRHOID SURGERY N/A 04/24/2016   Procedure: EXTENSIVE HEMORRHOIDECTOMY;  Surgeon: Vickie Epley, MD;  Location: [facility];  Service: General;  Laterality: N/A;   LAPAROSCOPY     adhesions   POLYPECTOMY N/A 03/14/2013   VOJ:JKKX Gastritis . ULCERS SEEN ON MAY 6 HAVE HEALED   POLYPECTOMY  03/31/2016   Procedure: POLYPECTOMY;  Surgeon: Danie Binder, MD;  [facility];  Service:  Endoscopy;;  sigmoid colon polyps x2, rectal polyps x2   TONSILLECTOMY     TUBAL LIGATION      Prior to Admission medications   Medication Sig Start Date End Date Taking? Authorizing Provider  albuterol (PROVENTIL) (2.5 MG/3ML) 0.083% nebulizer solution Take 3 mLs (2.5 mg total) by nebulization every 4 (four) hours as needed for wheezing or shortness of breath. 11/25/20  Yes [place], Modena Nunnery, MD  albuterol (VENTOLIN HFA) 108 (90 Base) MCG/ACT inhaler Inhale 2 puffs into the lungs every 4 (four) hours as needed for wheezing. 11/25/20  Yes [place], Modena Nunnery, MD  ALPRAZolam Duanne Moron) 1 MG tablet Take 1 tablet (1 mg total) by mouth 2 (two) times daily as needed for anxiety. Patient taking differently: Take 0.5-1 mg by mouth at bedtime. 11/25/20  Yes [place], Modena Nunnery, MD  brimonidine (ALPHAGAN) 0.2 % ophthalmic solution Place 1 drop into both eyes 2 (two) times daily. 11/25/20  Yes [place], Modena Nunnery, MD  budesonide-formoterol [facility]) 160-4.5 MCG/ACT inhaler Inhale 2 puffs into the lungs as needed. Patient taking differently: Inhale 2 puffs into the lungs daily as needed (Asthma). 11/25/20  Yes [place], Modena Nunnery, MD  chlorpheniramine-HYDROcodone [facility]) 10-8 MG/5ML SUER Take 5 mLs by mouth every 12 (twelve) hours as needed. Patient taking differently: Take 5 mLs by mouth every 12 (twelve) hours as needed for cough. 11/25/20  Yes [place], Modena Nunnery, MD  Cholecalciferol (VITAMIN D3) 50 MCG (2000 UT) TABS Take 2,000 Units by mouth daily.   Yes [provider]  cyclobenzaprine (FLEXERIL) 10 MG tablet TAKE ONE TABLET BY MOUTH DAILY  as needed muscle spasms Patient taking differently: Take 10 mg by mouth daily as needed (Arthritis and torn rotator cuff left). 11/25/20  Yes [place], Modena Nunnery, MD  dexlansoprazole (DEXILANT) 60 MG capsule Take 1 capsule (60 mg total) by mouth daily. 11/25/20  Yes [place], Modena Nunnery, MD  dicyclomine (BENTYL) 10 MG capsule Take one capsule before meals and  at bedtime AS NEEDED for abdominal pain and frequent stool. HOLD for constipation Patient taking differently: Take 10 mg by mouth daily as needed (IBS). 11/25/20  Yes [place], Modena Nunnery, MD  DUREZOL 0.05 % EMUL Place 1 drop into the right eye daily. 02/17/21  Yes [provider]  estradiol (ESTRACE VAGINAL) 0.1 MG/GM vaginal cream Place 1 Applicatorful vaginally daily as needed. Patient taking differently: Place 1 Applicatorful vaginally daily as needed (Dryness). 11/25/20  Yes [place], Modena Nunnery, MD  fenofibrate (TRICOR) 145 MG tablet Take 1 tablet (145 mg total) by mouth daily. Patient taking differently: Take 145 mg by mouth at bedtime. 11/25/20  Yes [place], Modena Nunnery, MD  HYDROcodone-acetaminophen (NORCO/VICODIN) 5-325 MG tablet Take 1 tablet by mouth every 6 (six) hours as needed for moderate pain. 11/25/20  Yes [place], Modena Nunnery, MD  hyoscyamine (ANASPAZ) 0.125 MG TBDP disintergrating tablet  Place 0.125 mg under the tongue daily as needed (IBS).   Yes [provider]  irbesartan (AVAPRO) 150 MG tablet Take 0.5 tablets (75 mg total) by mouth daily. 11/25/20  Yes [place], Modena Nunnery, MD  latanoprost (XALATAN) 0.005 % ophthalmic solution Place 1 drop into both eyes at bedtime. 11/25/20  Yes [place], Modena Nunnery, MD  levothyroxine (SYNTHROID) 25 MCG tablet Take 1 tablet (25 mcg total) by mouth daily before breakfast. 03/31/21  Yes [place], Marella Chimes, MD  Lidocaine-Hydrocortisone Ace 3-2.5 % KIT APPLY TO RECTUM QID for 2 weeks then AS NEEDED FOR RECTAL PAIN OR BLEEDING Patient taking differently: Place 1 application rectally daily as needed (AS NEEDED FOR RECTAL PAIN OR BLEEDING). [facility] 06/11/20  Yes Annitta Needs, NP  [employer] Shirley Friar) 5 % SOLN INSTILL 1 DROP IN BOTH EYES EVERY DAY AS NEEDED (EACH CONTAINER SHOULD YIELD 2 DROPS) STORE IN ORIGINAL FOIL POUCH Patient taking differently: Place 1 drop into both eyes daily. [facility] CONTAINER SHOULD YIELD 2 DROPS) STORE IN  ORIGINAL FOIL POUCH 11/25/20  Yes [place], Modena Nunnery, MD  magnesium oxide (HM MAGNESIUM) 400 MG tablet Take 2 tablets (800 mg total) by mouth daily. Patient taking differently: Take 400 mg by mouth daily. 11/11/18  Yes [place], Modena Nunnery, MD  meclizine (ANTIVERT) 12.5 MG tablet Take 1 tablet (12.5 mg total) by mouth 3 (three) times daily as needed for dizziness. Patient taking differently: Take 12.5 mg by mouth 3 (three) times daily as needed (vertigo). 11/25/20  Yes [place], Modena Nunnery, MD  Menthol, Topical Analgesic, (BIOFREEZE EX) Apply 1 application topically daily as needed (pain).   Yes [provider]  metroNIDAZOLE (METROCREAM) 0.75 % cream Apply 1 application topically as needed. Patient taking differently: Apply 1 application topically daily as needed (Rash on face). 11/25/20  Yes [place], Modena Nunnery, MD  neomycin-polymyxin b-dexamethasone (MAXITROL) 3.5-10000-0.1 OINT Place 1 application into both eyes at bedtime as needed. Patient taking differently: Place 1 application into both eyes at bedtime as needed (Dry eyes). 01/12/20  Yes [place], Modena Nunnery, MD  nitroGLYCERIN (NITROLINGUAL) 0.4 MG/SPRAY spray Place 1 spray under the tongue every 5 (five) minutes as needed for chest pain. 07/22/20  Yes [place], Modena Nunnery, MD  NON FORMULARY Take 1 tablet by mouth daily as needed (Stomach Bloat). FD GUARD   Yes [provider]  ondansetron (ZOFRAN) 4 MG tablet Take 1 tablet (4 mg total) by mouth every 8 (eight) hours as needed for nausea or vomiting. Patient taking differently: Take 4 mg by mouth daily as needed for nausea or vomiting. 11/25/20  Yes [place], Modena Nunnery, MD  Probiotic Product (PROBIOTIC DAILY PO) Take 1 capsule by mouth daily.   Yes [provider]  senna (SENOKOT) 8.6 MG TABS tablet Take 1 tablet by mouth daily.   Yes [provider]  UNABLE TO FIND Place 1 application rectally daily as needed. [place] Name: lidocaine 5% pramoxine 1% ointment per  rectum as needed.   Yes [provider]  zolpidem (AMBIEN) 10 MG tablet TAKE ONE TABLET BY MOUTH DAILY AT BEDTIME. Patient taking differently: Take 10 mg by mouth at bedtime. 02/05/21  Yes Lindell Spar, MD  naproxen (NAPROSYN) 500 MG tablet Take 500 mg by mouth daily as needed for moderate pain or mild pain.    [provider]  polyethylene glycol-electrolytes (TRILYTE) 420 g solution Take 4,000 mLs by mouth as directed. 04/17/21   Rourk, Cristopher Estimable, MD  Allergies as of 03/11/2021 - Review Complete 03/10/2021  Allergen Reaction Noted   Adhesive [tape] Other (See Comments) 03/10/2013   Aspirin Other (See Comments) 09/18/2011   Keflex [cephalexin] Hives 11/26/2016   Levaquin [levofloxacin in d5w]  12/29/2016   Other  10/08/2017   Statins Other (See Comments) 02/28/2013   Sulfonamide derivatives Other (See Comments) 12/10/2010   Latex Rash 12/13/2011    Family History  Problem Relation Age of Onset   Colon cancer Mother        41s   Urticaria Mother    Hypertension Mother    Cancer Mother    Cancer Father        oral   Crohn's disease Sister    Colon cancer Maternal Grandfather    Colon cancer Paternal Grandfather    Diabetes Brother    Colon cancer Maternal Grandmother    Anesthesia problems Neg Hx     Social History   Socioeconomic History   Marital status: Widowed    Spouse name: Not on file   Number of children: Not on file   Years of education: Not on file   Highest education level: Not on file  Occupational History   Occupation: homemaker    Employer: UNEMPLOYED  Tobacco Use   Smoking status: Former    Packs/day: 0.50    Years: 30.00    Pack years: 15.00    Types: Cigarettes   Smokeless tobacco: Former    Quit date: 10/15/1996   Tobacco comments:    Stopped smoking ~ 2000  Vaping Use   Vaping Use: Never used  Substance and Sexual Activity   Alcohol use: Yes    Comment: occassion   Drug use: No   Sexual activity: Yes    Birth  control/protection: Surgical  Other Topics Concern   Not on file  Social History Narrative   Does not routinely exercise. Husband passed in JUN 2015 due to prostate ca.   Social Determinants of Health   Financial Resource Strain: Low Risk    Difficulty of Paying Living Expenses: Not hard at all  Food Insecurity: No Food Insecurity   Worried About Charity fundraiser in the Last Year: Never true   [place] in the Last Year: Never true  Transportation Needs: No Transportation Needs   Lack of Transportation (Medical): No   Lack of Transportation (Non-Medical): No  Physical Activity: Sufficiently Active   Days of Exercise per Week: 7 days   Minutes of Exercise per Session: 60 min  Stress: No Stress Concern Present   Feeling of Stress : Not at all  Social Connections: Socially Isolated   Frequency of Communication with Friends and Family: More than three times a week   Frequency of Social Gatherings with Friends and Family: Never   Attends Religious Services: Never   Marine scientist or Organizations: No   Attends Archivist Meetings: Never   Marital Status: Widowed  Intimate Partner Violence: Not At Risk   Fear of Current or Ex-Partner: No   Emotionally Abused: No   Physically Abused: No   Sexually Abused: No    Review of Systems: See HPI, otherwise negative ROS  Physical Exam: Vital signs in last 24 hours: Temp:  [98.7 F (37.1 C)] 98.7 F (37.1 C) (07/18 0754) Pulse Rate:  [81] 81 (07/18 0754) Resp:  [17] 17 (07/18 0754) BP: (117)/(89) 117/89 (07/18 0754) SpO2:  [98 %] 98 % (07/18 0754)   General:  Alert,  Well-developed, well-nourished, pleasant and cooperative in NAD Head:  Normocephalic and atraumatic. Eyes:  Sclera clear, no icterus.   Conjunctiva pink. Ears:  Normal auditory acuity. Nose:  No deformity, discharge,  or lesions. Mouth:  No deformity or lesions, dentition normal. Neck:  Supple; no masses or thyromegaly. Lungs:  Clear  throughout to auscultation.   No wheezes, crackles, or rhonchi. No acute distress. Heart:  Regular rate and rhythm; no murmurs, clicks, rubs,  or gallops. Abdomen:  Soft, nontender and nondistended. No masses, hepatosplenomegaly or hernias noted. Normal bowel sounds, without guarding, and without rebound.   Msk:  Symmetrical without gross deformities. Normal posture. Extremities:  Without clubbing or edema. Neurologic:  Alert and  oriented x4;  grossly normal neurologically. Skin:  Intact without significant lesions or rashes. Cervical Nodes:  No significant cervical adenopathy. Psych:  Alert and cooperative. Normal mood and affect.  Impression/Plan: [name] is here for a colonoscopy for high risk colon cancer screening purposes due to family history of colon cancer.  Last colonoscopy in 2017 with hyperplastic polyps but has personal history of adenomas and also mother with history of colon cancer and multiple non-first degree relatives with colon cancer.   The risks of the procedure including infection, bleed, or perforation as well as benefits, limitations, alternatives and imponderables have been reviewed with the patient. Questions have been answered. All parties agreeable.

## 2021-04-21 NOTE — Telephone Encounter (Signed)
Can we please refer this patient to Dr. Leighton Ruff at [facility] in [place] for chronic anal fissure? Thank you

## 2021-04-22 ENCOUNTER — Telehealth: Payer: Self-pay

## 2021-04-22 LAB — SURGICAL PATHOLOGY

## 2021-04-22 NOTE — Telephone Encounter (Signed)
Called and spoke with patient.  She is doing better now after taking some Advil/sinus medication.  States she has a bad headache as well as itching in her throat and ears.  Possible reaction to anesthesia?  Do not think it is related to the colonoscopy itself.  Recommended that she take a dose of Benadryl to help.  She will call office if her symptoms do not improve over the course of the next 1 to 2 days.  Thank you

## 2021-04-22 NOTE — Telephone Encounter (Signed)
Pt had a procedure done yesterday (colonoscopy) and she thinks she is having a allergic reaction or some type from it. She states it began about 12 midnight  with the sensation of her skin having a tight feeling in her ears, throat and upper part of her neck. (A pulling sensation). Some nausea.No rash on her body, mild itching, some confusion yesterday (none today). Pt is nervous and jittery today and wants to know can she take Zofran for her nausea. Pt also needs to now what you want her to do.

## 2021-04-23 NOTE — Telephone Encounter (Signed)
noted 

## 2021-04-25 ENCOUNTER — Encounter (HOSPITAL_COMMUNITY): Payer: Self-pay | Admitting: Internal Medicine

## 2021-04-28 DIAGNOSIS — Z20822 Contact with and (suspected) exposure to covid-19: Secondary | ICD-10-CM | POA: Diagnosis not present

## 2021-05-12 ENCOUNTER — Telehealth: Payer: Self-pay | Admitting: Internal Medicine

## 2021-05-12 NOTE — Telephone Encounter (Signed)
   [name] DOB: 20-Sep-1951 MRN: [id number]   RIDER WAIVER AND RELEASE OF LIABILITY  For purposes of improving physical access to our facilities, [facility] is pleased to partner with third parties to provide [facility] patients or other authorized individuals the option of convenient, on-demand ground transportation services (the [place]") through use of the technology service that enables users to request on-demand ground transportation from independent third-party providers.  By opting to use and accept these [employer], I, the undersigned, hereby agree on behalf of myself, and on behalf of any minor child using the Government social research officer for whom I am the parent or legal guardian, as follows:  Government social research officer provided to me are provided by independent third-party transportation providers who are not [employer] or employees and who are unaffiliated with [employer]. [facility] is neither a transportation carrier nor a common or public carrier. [facility] has no control over the quality or safety of the transportation that occurs as a result of the [employer]. [facility] cannot guarantee that any third-party transportation provider will complete any arranged transportation service. [facility] makes no representation, warranty, or guarantee regarding the reliability, timeliness, quality, safety, suitability, or availability of any of the Transport Services or that they will be error free. I fully understand that traveling by vehicle involves risks and dangers of serious bodily injury, including permanent disability, paralysis, and death. I agree, on behalf of myself and on behalf of any minor child using the Transport Services for whom I am the parent or legal guardian, that the entire risk arising out of my use of the [employer] remains solely with me, to the maximum extent permitted under applicable law. The [employer] are provided "as  is" and "as available." [facility] disclaims all representations and warranties, express, implied or statutory, not expressly set out in these terms, including the implied warranties of merchantability and fitness for a particular purpose. I hereby waive and release [facility], its agents, employees, officers, directors, representatives, insurers, attorneys, assigns, successors, subsidiaries, and affiliates from any and all past, present, or future claims, demands, liabilities, actions, causes of action, or suits of any kind directly or indirectly arising from acceptance and use of the [employer]. I further waive and release [facility] and its affiliates from all present and future liability and responsibility for any injury or death to persons or damages to property caused by or related to the use of the [employer]. I have read this Waiver and Release of Liability, and I understand the terms used in it and their legal significance. This Waiver is freely and voluntarily given with the understanding that my right (as well as the right of any minor child for whom I am the parent or legal guardian using the [employer]) to legal recourse against  in connection with the [employer] is knowingly surrendered in return for use of these services.   I attest that I read the consent document to [name], gave [name] the opportunity to ask questions and answered the questions asked (if any). I affirm that [name] then provided consent for she's participation in this program.     [name]

## 2021-05-13 ENCOUNTER — Ambulatory Visit: Payer: Medicare Other | Admitting: Internal Medicine

## 2021-05-13 DIAGNOSIS — H43811 Vitreous degeneration, right eye: Secondary | ICD-10-CM | POA: Diagnosis not present

## 2021-05-13 LAB — HM DIABETES EYE EXAM

## 2021-05-15 ENCOUNTER — Encounter: Payer: Self-pay | Admitting: *Deleted

## 2021-05-16 ENCOUNTER — Ambulatory Visit (INDEPENDENT_AMBULATORY_CARE_PROVIDER_SITE_OTHER): Payer: Medicare Other | Admitting: Internal Medicine

## 2021-05-16 ENCOUNTER — Other Ambulatory Visit: Payer: Self-pay

## 2021-05-16 ENCOUNTER — Encounter: Payer: Self-pay | Admitting: Internal Medicine

## 2021-05-16 VITALS — BP 121/77 | HR 86 | Temp 98.8°F | Resp 18 | Ht 67.0 in | Wt 206.0 lb

## 2021-05-16 DIAGNOSIS — I10 Essential (primary) hypertension: Secondary | ICD-10-CM | POA: Diagnosis not present

## 2021-05-16 DIAGNOSIS — Z9841 Cataract extraction status, right eye: Secondary | ICD-10-CM

## 2021-05-16 DIAGNOSIS — E039 Hypothyroidism, unspecified: Secondary | ICD-10-CM | POA: Diagnosis not present

## 2021-05-16 DIAGNOSIS — Z7189 Other specified counseling: Secondary | ICD-10-CM

## 2021-05-16 DIAGNOSIS — Z961 Presence of intraocular lens: Secondary | ICD-10-CM | POA: Diagnosis not present

## 2021-05-16 DIAGNOSIS — J4520 Mild intermittent asthma, uncomplicated: Secondary | ICD-10-CM | POA: Diagnosis not present

## 2021-05-16 DIAGNOSIS — G43109 Migraine with aura, not intractable, without status migrainosus: Secondary | ICD-10-CM | POA: Diagnosis not present

## 2021-05-16 MED ORDER — TRAMADOL HCL 50 MG PO TABS
50.0000 mg | ORAL_TABLET | Freq: Two times a day (BID) | ORAL | 0 refills | Status: AC | PRN
Start: 2021-05-16 — End: 2021-05-31

## 2021-05-16 NOTE — Patient Instructions (Signed)
Please take Tramadol for severe headache.  Please continue taking medications as prescribed.  Please continue to follow up with Ophthalmology.

## 2021-05-17 DIAGNOSIS — Z7189 Other specified counseling: Secondary | ICD-10-CM | POA: Insufficient documentation

## 2021-05-17 DIAGNOSIS — G43109 Migraine with aura, not intractable, without status migrainosus: Secondary | ICD-10-CM | POA: Insufficient documentation

## 2021-05-17 NOTE — Assessment & Plan Note (Signed)
On Levothyroxine Follows up with Dr Dorris Fetch

## 2021-05-17 NOTE — Assessment & Plan Note (Signed)
.   BP Readings from Last 1 Encounters:  05/16/21 121/77   Well-controlled with Irbesartan 75 mg QD Counseled for compliance with the medications Advised DASH diet and moderate exercise/walking as tolerated

## 2021-05-17 NOTE — Progress Notes (Signed)
Established Patient Office Visit  Subjective:  Patient ID: [name], female    DOB: 04-Jan-1951  Age: 70 y.o. MRN: [id number]  CC:  Chief Complaint  Patient presents with   Follow-up    Follow pt has ocular migraines she needs a referral to [facility] as the transportation will take her htn and asthma is doing fine     HPI [name] is a 70 year old female with PMH of HTN, GERD, IBS-C, NASH, anal fissure, hypothyroidism, OA, DDD of lumbar spine, anxiety/panic disorder, insomnia, left sided blind eye, G6PD mutation and sickle cell trait who presents for chronic medical conditions.  HTB: BP is well-controlled. Takes medications regularly. Patient denies headache, dizziness, chest pain, dyspnea or palpitations.  Asthma: Well-controlled. Denies any dyspnea or wheezing.  She had cataract surgery recently. She has been having severe light sensitivity and severe headache since then. She is undergoing evaluation for ocular migraine. She asks for a pain medication for now. She is allergic to triptans  She lives by herself and does not have any help at home.  Past Medical History:  Diagnosis Date   Anxiety    Arthritis    Asthma    Cataract    Phreesia 01/05/2021   Chest pain    a. 02/2000 Cath: nl cors, EF 60%;  b. 10/2010 MV: nl LV, no ischemia/infarct;  c. 03/2014 Admit c/p, r/o->grief from husbands death.   Chronic RUQ pain 12/07/07   EUS slightly dilated CBD (7.6m), otherwise nl   Depression    Eczema    Exposure to chemical inhalation mid 1970s   Fatty liver    G6PD deficiency    GERD (gastroesophageal reflux disease)    Glaucoma    History of pulmonary embolus (PE)    Treated with Eliquis 22016-03-03  HTN (hypertension)    Hypercholesteremia    a. intolerant to statins.   Hypothyroidism    IBS (irritable bowel syndrome)    Kidney stone    Legally blind    Palpitations    PONV (postoperative nausea and vomiting)    Recurrent upper  respiratory infection (URI)    Renal cyst    Sickle cell trait (HCC)    Tubular adenoma of colon 09/17/2011   Urticaria     Past Surgical History:  Procedure Laterality Date   ABDOMINAL HYSTERECTOMY     ADENOIDECTOMY     ANGIOPLASTY     APPENDECTOMY     BIOPSY N/A 03/14/2013   Procedure: SMALL BOWEL AND GASTRIC BIOPSIES (Procedure #1);  Surgeon: SDanie Binder MD;  Location: [facility];  Service: Endoscopy;  Laterality: N/A;   CARDIAC CATHETERIZATION Left 02/11/2000   CATARACT EXTRACTION Left    CATARACT EXTRACTION W/PHACO Right 03/19/2021   Procedure: CATARACT EXTRACTION PHACO AND INTRAOCULAR LENS PLACEMENT ([place] RIGHT 6.75 00:50.4;  Surgeon: BLeandrew Koyanagi MD;  [place] [facility]  Service: Ophthalmology;  Laterality: Right;   CHOLECYSTECTOMY  12/2007   COLONOSCOPY  12/22/2010   RW[facility]:ENIDPO cecal adenomatous polyp   COLONOSCOPY WITH PROPOFOL N/A 03/31/2016   Dr. FOneida Alar four sessile polyps rectum/sigmoid colon, diverticulosi, ext/int hemorrhoids. hyperplastic polyps, next tcs 5 years.   COLONOSCOPY WITH PROPOFOL N/A 04/21/2021   Procedure: COLONOSCOPY WITH PROPOFOL;  Surgeon: CEloise Harman DO;  [facility];  Service: Endoscopy;  Laterality: N/A;  9:30am   complete hysterectomy     ENTEROSCOPY N/A 03/14/2013   SEUM:PNTIgastritis/ulcers has healed   ESOPHAGOGASTRODUODENOSCOPY  09/22/2011  MPN:TIRW gastritis/Duodenitis   EXCISIONAL HEMORRHOIDECTOMY     FLEXIBLE SIGMOIDOSCOPY N/A 03/14/2013   SLF:3 colon polyp removed/moderate sized internal hemorrhoids   FLEXIBLE SIGMOIDOSCOPY N/A 06/09/2016   Procedure: FLEXIBLE SIGMOIDOSCOPY;  Surgeon: Danie Binder, MD;  [facility];  Service: Endoscopy;  Laterality: N/A;  rectal polyps times 2   FLEXIBLE SIGMOIDOSCOPY N/A 03/18/2018   Procedure: FLEXIBLE SIGMOIDOSCOPY;  Surgeon: Danie Binder, MD;  [facility];  Service: Endoscopy;  Laterality: N/A;  9:15am   FOOT SURGERY     bunion  removal left   GIVENS CAPSULE STUDY N/A 02/10/2013   Procedure: GIVENS CAPSULE STUDY;  Surgeon: Danie Binder, MD;  [facility];  Service: Endoscopy;  Laterality: N/A;  [place]     right    HEMORRHOID BANDING N/A 03/14/2013   Procedure: HEMORRHOID BANDING (Procedure #3)  3 bands applied ERX#[id number] Exp 02/02/2014 ;  Surgeon: Danie Binder, MD;  Location: [facility];  Service: Endoscopy;  Laterality: N/A;   HEMORRHOID SURGERY N/A 04/24/2016   Procedure: EXTENSIVE HEMORRHOIDECTOMY;  Surgeon: Vickie Epley, MD;  Location: [facility];  Service: General;  Laterality: N/A;   LAPAROSCOPY     adhesions   POLYPECTOMY N/A 03/14/2013   PPJ:KDTO Gastritis . ULCERS SEEN ON MAY 6 HAVE HEALED   POLYPECTOMY  03/31/2016   Procedure: POLYPECTOMY;  Surgeon: Danie Binder, MD;  [facility];  Service: Endoscopy;;  sigmoid colon polyps x2, rectal polyps x2   POLYPECTOMY  04/21/2021   Procedure: POLYPECTOMY;  Surgeon: Eloise Harman, DO;  [facility];  Service: Endoscopy;;   TONSILLECTOMY     TUBAL LIGATION      Family History  Problem Relation Age of Onset   Colon cancer Mother        66s   Urticaria Mother    Hypertension Mother    Cancer Mother    Cancer Father        oral   Crohn's disease Sister    Colon cancer Maternal Grandfather    Colon cancer Paternal Grandfather    Diabetes Brother    Colon cancer Maternal Grandmother    Anesthesia problems Neg Hx     Social History   Socioeconomic History   Marital status: Widowed    Spouse name: Not on file   Number of children: Not on file   Years of education: Not on file   Highest education level: Not on file  Occupational History   Occupation: homemaker    Employer: UNEMPLOYED  Tobacco Use   Smoking status: Former    Packs/day: 0.50    Years: 30.00    Pack years: 15.00    Types: Cigarettes   Smokeless tobacco: Former    Quit date: 10/15/1996   Tobacco comments:    Stopped smoking  ~ 2000  Vaping Use   Vaping Use: Never used  Substance and Sexual Activity   Alcohol use: Yes    Comment: occassion   Drug use: No   Sexual activity: Yes    Birth control/protection: Surgical  Other Topics Concern   Not on file  Social History Narrative   Does not routinely exercise. Husband passed in JUN 2015 due to prostate ca.   Social Determinants of Health   Financial Resource Strain: Low Risk    Difficulty of Paying Living Expenses: Not hard at all  Food Insecurity: No Food Insecurity   Worried About Running Out  of Food in the Last Year: Never true   Rooks in the Last Year: Never true  Transportation Needs: No Transportation Needs   Lack of Transportation (Medical): No   Lack of Transportation (Non-Medical): No  Physical Activity: Sufficiently Active   Days of Exercise per Week: 7 days   Minutes of Exercise per Session: 60 min  Stress: No Stress Concern Present   Feeling of Stress : Not at all  Social Connections: Socially Isolated   Frequency of Communication with Friends and Family: More than three times a week   Frequency of Social Gatherings with Friends and Family: Never   Attends Religious Services: Never   Marine scientist or Organizations: No   Attends Archivist Meetings: Never   Marital Status: Widowed  Human resources officer Violence: Not At Risk   Fear of Current or Ex-Partner: No   Emotionally Abused: No   Physically Abused: No   Sexually Abused: No    Outpatient Medications Prior to Visit  Medication Sig Dispense Refill   albuterol (PROVENTIL) (2.5 MG/3ML) 0.083% nebulizer solution Take 3 mLs (2.5 mg total) by nebulization every 4 (four) hours as needed for wheezing or shortness of breath. 150 mL 3   albuterol (VENTOLIN HFA) 108 (90 Base) MCG/ACT inhaler Inhale 2 puffs into the lungs every 4 (four) hours as needed for wheezing. 54 g 3   ALPRAZolam (XANAX) 1 MG tablet Take 1 tablet (1 mg total) by mouth 2 (two) times daily as  needed for anxiety. (Patient taking differently: Take 0.5-1 mg by mouth at bedtime.) 180 tablet 3   brimonidine (ALPHAGAN) 0.2 % ophthalmic solution Place 1 drop into both eyes 2 (two) times daily. 15 mL 3   budesonide-formoterol (SYMBICORT) 160-4.5 MCG/ACT inhaler Inhale 2 puffs into the lungs as needed. (Patient taking differently: Inhale 2 puffs into the lungs daily as needed (Asthma).) 10.2 g 3   chlorpheniramine-HYDROcodone (TUSSIONEX PENNKINETIC ER) 10-8 MG/5ML SUER Take 5 mLs by mouth every 12 (twelve) hours as needed. (Patient taking differently: Take 5 mLs by mouth every 12 (twelve) hours as needed for cough.) 140 mL 0   Cholecalciferol (VITAMIN D3) 50 MCG (2000 UT) TABS Take 2,000 Units by mouth daily.     cyclobenzaprine (FLEXERIL) 10 MG tablet TAKE ONE TABLET BY MOUTH DAILY  as needed muscle spasms (Patient taking differently: Take 10 mg by mouth daily as needed (Arthritis and torn rotator cuff left).) 90 tablet 3   dexlansoprazole (DEXILANT) 60 MG capsule Take 1 capsule (60 mg total) by mouth daily. 90 capsule 3   dicyclomine (BENTYL) 10 MG capsule Take one capsule before meals and at bedtime AS NEEDED for abdominal pain and frequent stool. HOLD for constipation (Patient taking differently: Take 10 mg by mouth daily as needed (IBS).) 120 capsule 1   DUREZOL 0.05 % EMUL Place 1 drop into the right eye daily.     estradiol (ESTRACE VAGINAL) 0.1 MG/GM vaginal cream Place 1 Applicatorful vaginally daily as needed. (Patient taking differently: Place 1 Applicatorful vaginally daily as needed (Dryness).) 42.5 g 3   fenofibrate (TRICOR) 145 MG tablet Take 1 tablet (145 mg total) by mouth daily. (Patient taking differently: Take 145 mg by mouth at bedtime.) 90 tablet 3   HYDROcodone-acetaminophen (NORCO/VICODIN) 5-325 MG tablet Take 1 tablet by mouth every 6 (six) hours as needed.     hyoscyamine (ANASPAZ) 0.125 MG TBDP disintergrating tablet Place 0.125 mg under the tongue daily as needed (IBS).  irbesartan (AVAPRO) 150 MG tablet Take 0.5 tablets (75 mg total) by mouth daily. 45 tablet 3   latanoprost (XALATAN) 0.005 % ophthalmic solution Place 1 drop into both eyes at bedtime. 9 mL 3   levothyroxine (SYNTHROID) 25 MCG tablet Take 1 tablet (25 mcg total) by mouth daily before breakfast. 95 tablet 1   Lidocaine-Hydrocortisone Ace 3-2.5 % KIT APPLY TO RECTUM QID for 2 weeks then AS NEEDED FOR RECTAL PAIN OR BLEEDING 1 kit 11   Lifitegrast (XIIDRA) 5 % SOLN INSTILL 1 DROP IN BOTH EYES EVERY DAY AS NEEDED (EACH CONTAINER SHOULD YIELD 2 DROPS) STORE IN ORIGINAL FOIL POUCH (Patient taking differently: Place 1 drop into both eyes daily. (EACH CONTAINER SHOULD YIELD 2 DROPS) STORE IN ORIGINAL FOIL POUCH) 60 each 3   magnesium oxide (HM MAGNESIUM) 400 MG tablet Take 2 tablets (800 mg total) by mouth daily. (Patient taking differently: Take 400 mg by mouth daily.)     meclizine (ANTIVERT) 12.5 MG tablet Take 1 tablet (12.5 mg total) by mouth 3 (three) times daily as needed for dizziness. (Patient taking differently: Take 12.5 mg by mouth 3 (three) times daily as needed (vertigo).) 30 tablet 3   Menthol, Topical Analgesic, (BIOFREEZE EX) Apply 1 application topically daily as needed (pain).     metroNIDAZOLE (METROCREAM) 0.75 % cream Apply 1 application topically as needed. (Patient taking differently: Apply 1 application topically daily as needed (Rash on face).) 45 g 3   naproxen (NAPROSYN) 500 MG tablet Take 500 mg by mouth daily as needed for moderate pain or mild pain.     neomycin-polymyxin b-dexamethasone (MAXITROL) 3.5-10000-0.1 OINT Place 1 application into both eyes at bedtime as needed. (Patient taking differently: Place 1 application into both eyes at bedtime as needed (Dry eyes).) 10.5 g 3   nitroGLYCERIN (NITROLINGUAL) 0.4 MG/SPRAY spray Place 1 spray under the tongue every 5 (five) minutes as needed for chest pain. 12 g 3   NON FORMULARY Take 1 tablet by mouth daily as needed (Stomach  Bloat). FD GUARD     ondansetron (ZOFRAN) 4 MG tablet Take 1 tablet (4 mg total) by mouth every 8 (eight) hours as needed for nausea or vomiting. (Patient taking differently: Take 4 mg by mouth daily as needed for nausea or vomiting.) 30 tablet 1   polyethylene glycol-electrolytes (TRILYTE) 420 g solution Take 4,000 mLs by mouth as directed. 4000 mL 0   Probiotic Product (PROBIOTIC DAILY PO) Take 1 capsule by mouth daily.     senna (SENOKOT) 8.6 MG TABS tablet Take 1 tablet by mouth daily.     UNABLE TO FIND Place 1 application rectally daily as needed. [place] Name: lidocaine 5% pramoxine 1% ointment per rectum as needed.     zolpidem (AMBIEN) 10 MG tablet TAKE ONE TABLET BY MOUTH DAILY AT BEDTIME. (Patient taking differently: Take 10 mg by mouth at bedtime.) 90 tablet 3   HYDROcodone-acetaminophen (NORCO/VICODIN) 5-325 MG tablet Take 1 tablet by mouth every 6 (six) hours as needed for moderate pain. 10 tablet 0   No facility-administered medications prior to visit.    Allergies  Allergen Reactions   Adhesive [Tape] Other (See Comments)    Blisters skin   Aspirin Other (See Comments)    sickle cell trait--  Not recommended unless emergency   Imitrex [Sumatriptan] Hives   Keflex [Cephalexin] Other (See Comments)    G6PD   Levaquin [Levofloxacin In D5w]     Altered mental status Affects G6PD  Other     Nut Oil- skin irritation    Statins Other (See Comments)    Myopathy - elevated CK   Sulfonamide Derivatives Other (See Comments)    Patient has sickle cell trait   Latex Rash    ROS Review of Systems  Constitutional:  Negative for chills and fever.  HENT:  Negative for congestion, sinus pressure, sinus pain and sore throat.   Eyes:  Positive for photophobia, pain and visual disturbance. Negative for discharge.  Respiratory:  Negative for cough and shortness of breath.   Cardiovascular:  Negative for chest pain and palpitations.  Gastrointestinal:  Positive  for constipation. Negative for abdominal pain, blood in stool, diarrhea, nausea and vomiting.  Endocrine: Negative for polydipsia and polyuria.  Genitourinary:  Negative for dysuria and hematuria.  Musculoskeletal:  Negative for neck pain and neck stiffness.  Skin:  Negative for rash.  Neurological:  Positive for headaches. Negative for dizziness and weakness.  Psychiatric/Behavioral:  Negative for agitation and behavioral problems.      Objective:    Physical Exam Vitals reviewed.  Constitutional:      General: She is not in acute distress.    Appearance: She is not diaphoretic.  HENT:     Head: Normocephalic and atraumatic.     Nose: Nose normal.     Mouth/Throat:     Mouth: Mucous membranes are moist.  Eyes:     General: No scleral icterus.    Extraocular Movements: Extraocular movements intact.  Cardiovascular:     Rate and Rhythm: Normal rate and regular rhythm.     Pulses: Normal pulses.     Heart sounds: Normal heart sounds. No murmur heard. Pulmonary:     Breath sounds: Normal breath sounds. No wheezing or rales.  Musculoskeletal:     Cervical back: Neck supple. No tenderness.     Right lower leg: No edema.     Left lower leg: No edema.  Skin:    General: Skin is warm.     Findings: No rash.  Neurological:     General: No focal deficit present.     Mental Status: She is alert and oriented to person, place, and time.     Sensory: No sensory deficit.     Motor: No weakness.  Psychiatric:        Mood and Affect: Mood normal.        Behavior: Behavior normal.    BP 121/77 (BP Location: Left Arm, Patient Position: Sitting, Cuff Size: Normal)   Pulse 86   Temp 98.8 F (37.1 C) (Oral)   Resp 18   Ht _0  (1.702 m)   Wt 206 lb 0.6 oz (93.5 kg)   SpO2 95%   BMI 32.27 kg/m  Wt Readings from Last 3 Encounters:  05/16/21 206 lb 0.6 oz (93.5 kg)  04/02/21 203 lb (92.1 kg)  03/31/21 205 lb 6.4 oz (93.2 kg)     Health Maintenance Due  Topic Date Due    COVID-19 Vaccine (4 - Booster for Moderna series) 12/02/2020   INFLUENZA VACCINE  05/05/2021    There are no preventive care reminders to display for this patient.  Lab Results  Component Value Date   TSH 1.400 03/24/2021   Lab Results  Component Value Date   WBC 6.9 12/11/2020   HGB 13.7 12/11/2020   HCT 39.1 12/11/2020   MCV 93.1 12/11/2020   PLT 264 12/11/2020   Lab Results  Component Value Date   NA  137 12/11/2020   K 4.5 12/11/2020   CO2 20 12/11/2020   GLUCOSE 119 (H) 12/11/2020   BUN 19 12/11/2020   CREATININE 1.23 (H) 12/11/2020   BILITOT 0.8 12/11/2020   ALKPHOS 59 06/11/2020   AST 39 (H) 12/11/2020   ALT 32 (H) 12/11/2020   PROT 7.1 12/11/2020   ALBUMIN 4.7 06/11/2020   CALCIUM 10.0 03/24/2021   ANIONGAP 7 03/25/2016   Lab Results  Component Value Date   CHOL 228 (H) 12/11/2020   Lab Results  Component Value Date   HDL 33 (L) 12/11/2020   Lab Results  Component Value Date   LDLCALC 156 (H) 12/11/2020   Lab Results  Component Value Date   TRIG 231 (H) 12/11/2020   Lab Results  Component Value Date   CHOLHDL 6.9 (H) 12/11/2020   Lab Results  Component Value Date   HGBA1C 5.1 12/11/2020      Assessment & Plan:   Problem List Items Addressed This Visit       Cardiovascular and Mediastinum   Essential hypertension, benign - Primary    . BP Readings from Last 1 Encounters:  05/16/21 121/77  Well-controlled with Irbesartan 75 mg QD Counseled for compliance with the medications Advised DASH diet and moderate exercise/walking as tolerated      Ocular migraine    Since recent cataract surgery She is allergic to triptans. Would avoid adding ppx treatment for now until she is undergoing Ophthalmology evaluation Tramadol PRN for severe headache      Relevant Medications   HYDROcodone-acetaminophen (NORCO/VICODIN) 5-325 MG tablet   traMADol (ULTRAM) 50 MG tablet   Other Relevant Orders   Ambulatory referral to Ophthalmology      Respiratory   Asthma    Well-controlled with Symbicort and PRN Albuterol        Endocrine   Hypothyroidism    On Levothyroxine Follows up with Dr Dorris Fetch      Other Visit Diagnoses     S/P cataract extraction and insertion of intraocular lens, right           Meds ordered this encounter  Medications   traMADol (ULTRAM) 50 MG tablet    Sig: Take 1 tablet (50 mg total) by mouth every 12 (twelve) hours as needed for up to 15 days.    Dispense:  30 tablet    Refill:  0    Follow-up: Return in about 4 months (around 09/15/2021) for HTN and asthma.    Lindell Spar, MD

## 2021-05-17 NOTE — Assessment & Plan Note (Signed)
Well-controlled with Symbicort and PRN Albuterol

## 2021-05-17 NOTE — Assessment & Plan Note (Signed)
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 No support at home Referred to [facility] care management for home safety evaluation and health aide

## 2021-05-17 NOTE — Assessment & Plan Note (Signed)
Since recent cataract surgery She is allergic to triptans. Would avoid adding ppx treatment for now until she is undergoing Ophthalmology evaluation Tramadol PRN for severe headache

## 2021-05-19 ENCOUNTER — Telehealth: Payer: Self-pay | Admitting: *Deleted

## 2021-05-19 ENCOUNTER — Other Ambulatory Visit: Payer: Self-pay

## 2021-05-19 DIAGNOSIS — Z789 Other specified health status: Secondary | ICD-10-CM

## 2021-05-19 NOTE — Chronic Care Management (AMB) (Signed)
Chronic Care Management   Note  05/19/2021 Name: [name] MRN: [id number] DOB: 10/17/1950  [name] is a 70 y.o. year old female who is a primary care patient of Anabel Halon, MD. I reached out to [name] by phone today in response to a referral sent by Ms. Gemma Payor Korn's PCP, Dr. Allena Katz.      Ms. Klug was given information about Chronic Care Management services today including:  CCM service includes personalized support from designated clinical staff supervised by her physician, including individualized plan of care and coordination with other care providers 24/7 contact phone numbers for assistance for urgent and routine care needs. Service will only be billed when office clinical staff spend 20 minutes or more in a month to coordinate care. Only one practitioner may furnish and bill the service in a calendar month. The patient may stop CCM services at any time (effective at the end of the month) by phone call to the office staff. The patient will be responsible for cost sharing (co-pay) of up to 20% of the service fee (after annual deductible is met).  Patient agreed to services and verbal consent obtained.   Follow up plan: Telephone appointment with care management team member scheduled for:05/22/2021  [facility] Guide, Embedded Care Coordination [facility]  Care Management  Direct Dial: [phone]

## 2021-05-22 ENCOUNTER — Ambulatory Visit (INDEPENDENT_AMBULATORY_CARE_PROVIDER_SITE_OTHER): Payer: Medicare Other | Admitting: *Deleted

## 2021-05-22 DIAGNOSIS — N182 Chronic kidney disease, stage 2 (mild): Secondary | ICD-10-CM

## 2021-05-22 DIAGNOSIS — E1159 Type 2 diabetes mellitus with other circulatory complications: Secondary | ICD-10-CM | POA: Diagnosis not present

## 2021-05-22 DIAGNOSIS — I152 Hypertension secondary to endocrine disorders: Secondary | ICD-10-CM

## 2021-05-22 NOTE — Chronic Care Management (AMB) (Signed)
Chronic Care Management   CCM RN Visit Note  05/22/2021 Name: [name] MRN: [id number] DOB: 11-22-1950  Subjective: [name] is a 70 y.o. year old female who is a primary care patient of Lindell Spar, MD. The care management team was consulted for assistance with disease management and care coordination needs.    Engaged with patient by telephone for initial visit in response to provider referral for case management and/or care coordination services.   Consent to Services:  The patient was given the following information about Chronic Care Management services today, agreed to services, and gave verbal consent: 1. CCM service includes personalized support from designated clinical staff supervised by the primary care provider, including individualized plan of care and coordination with other care providers 2. 24/7 contact phone numbers for assistance for urgent and routine care needs. 3. Service will only be billed when office clinical staff spend 20 minutes or more in a month to coordinate care. 4. Only one practitioner may furnish and bill the service in a calendar month. 5.The patient may stop CCM services at any time (effective at the end of the month) by phone call to the office staff. 6. The patient will be responsible for cost sharing (co-pay) of up to 20% of the service fee (after annual deductible is met). Patient agreed to services and consent obtained.  Patient agreed to services and verbal consent obtained.   Assessment: Review of patient past medical history, allergies, medications, health status, including review of consultants reports, laboratory and other test data, was performed as part of comprehensive evaluation and provision of chronic care management services.   SDOH (Social Determinants of Health) assessments and interventions performed:  SDOH Interventions    Flowsheet Row Most Recent Value  SDOH Interventions   Food Insecurity Interventions Intervention  Not Indicated  Housing Interventions Intervention Not Indicated  Transportation Interventions Intervention Not Indicated, Other (Comment)  [[facility]]        CCM Care Plan  Allergies  Allergen Reactions   Adhesive [Tape] Other (See Comments)    Blisters skin   Aspirin Other (See Comments)    sickle cell trait--  Not recommended unless emergency   Imitrex [Sumatriptan] Hives   Keflex [Cephalexin] Other (See Comments)    G6PD   Levaquin [Levofloxacin In D5w]     Altered mental status Affects G6PD   Other     Nut Oil- skin irritation    Statins Other (See Comments)    Myopathy - elevated CK   Sulfonamide Derivatives Other (See Comments)    Patient has sickle cell trait   Latex Rash    Outpatient Encounter Medications as of 05/22/2021  Medication Sig   albuterol (PROVENTIL) (2.5 MG/3ML) 0.083% nebulizer solution Take 3 mLs (2.5 mg total) by nebulization every 4 (four) hours as needed for wheezing or shortness of breath.   albuterol (VENTOLIN HFA) 108 (90 Base) MCG/ACT inhaler Inhale 2 puffs into the lungs every 4 (four) hours as needed for wheezing.   ALPRAZolam (XANAX) 1 MG tablet Take 1 tablet (1 mg total) by mouth 2 (two) times daily as needed for anxiety. (Patient taking differently: Take 0.5-1 mg by mouth at bedtime.)   brimonidine (ALPHAGAN) 0.2 % ophthalmic solution Place 1 drop into both eyes 2 (two) times daily.   budesonide-formoterol (SYMBICORT) 160-4.5 MCG/ACT inhaler Inhale 2 puffs into the lungs as needed. (Patient taking differently: Inhale 2 puffs into the lungs daily as needed (Asthma).)   chlorpheniramine-HYDROcodone (TUSSIONEX PENNKINETIC  ER) 10-8 MG/5ML SUER Take 5 mLs by mouth every 12 (twelve) hours as needed. (Patient taking differently: Take 5 mLs by mouth every 12 (twelve) hours as needed for cough.)   Cholecalciferol (VITAMIN D3) 50 MCG (2000 UT) TABS Take 2,000 Units by mouth daily.   cyclobenzaprine (FLEXERIL) 10 MG tablet TAKE ONE  TABLET BY MOUTH DAILY  as needed muscle spasms (Patient taking differently: Take 10 mg by mouth daily as needed (Arthritis and torn rotator cuff left).)   dexlansoprazole (DEXILANT) 60 MG capsule Take 1 capsule (60 mg total) by mouth daily.   dicyclomine (BENTYL) 10 MG capsule Take one capsule before meals and at bedtime AS NEEDED for abdominal pain and frequent stool. HOLD for constipation (Patient taking differently: Take 10 mg by mouth daily as needed (IBS).)   DUREZOL 0.05 % EMUL Place 1 drop into the right eye daily.   estradiol (ESTRACE VAGINAL) 0.1 MG/GM vaginal cream Place 1 Applicatorful vaginally daily as needed. (Patient taking differently: Place 1 Applicatorful vaginally daily as needed (Dryness).)   fenofibrate (TRICOR) 145 MG tablet Take 1 tablet (145 mg total) by mouth daily. (Patient taking differently: Take 145 mg by mouth at bedtime.)   HYDROcodone-acetaminophen (NORCO/VICODIN) 5-325 MG tablet Take 1 tablet by mouth every 6 (six) hours as needed.   hyoscyamine (ANASPAZ) 0.125 MG TBDP disintergrating tablet Place 0.125 mg under the tongue daily as needed (IBS).   irbesartan (AVAPRO) 150 MG tablet Take 0.5 tablets (75 mg total) by mouth daily.   latanoprost (XALATAN) 0.005 % ophthalmic solution Place 1 drop into both eyes at bedtime.   levothyroxine (SYNTHROID) 25 MCG tablet Take 1 tablet (25 mcg total) by mouth daily before breakfast.   Lidocaine-Hydrocortisone Ace 3-2.5 % KIT APPLY TO RECTUM QID for 2 weeks then AS NEEDED FOR RECTAL PAIN OR BLEEDING   Lifitegrast (XIIDRA) 5 % SOLN INSTILL 1 DROP IN BOTH EYES EVERY DAY AS NEEDED (EACH CONTAINER SHOULD YIELD 2 DROPS) STORE IN ORIGINAL FOIL POUCH (Patient taking differently: Place 1 drop into both eyes daily. (EACH CONTAINER SHOULD YIELD 2 DROPS) STORE IN ORIGINAL FOIL POUCH)   magnesium oxide (HM MAGNESIUM) 400 MG tablet Take 2 tablets (800 mg total) by mouth daily. (Patient taking differently: Take 400 mg by mouth daily.)   meclizine  (ANTIVERT) 12.5 MG tablet Take 1 tablet (12.5 mg total) by mouth 3 (three) times daily as needed for dizziness. (Patient taking differently: Take 12.5 mg by mouth 3 (three) times daily as needed (vertigo).)   Menthol, Topical Analgesic, (BIOFREEZE EX) Apply 1 application topically daily as needed (pain).   metroNIDAZOLE (METROCREAM) 0.75 % cream Apply 1 application topically as needed. (Patient taking differently: Apply 1 application topically daily as needed (Rash on face).)   naproxen (NAPROSYN) 500 MG tablet Take 500 mg by mouth daily as needed for moderate pain or mild pain.   neomycin-polymyxin b-dexamethasone (MAXITROL) 3.5-10000-0.1 OINT Place 1 application into both eyes at bedtime as needed. (Patient taking differently: Place 1 application into both eyes at bedtime as needed (Dry eyes).)   nitroGLYCERIN (NITROLINGUAL) 0.4 MG/SPRAY spray Place 1 spray under the tongue every 5 (five) minutes as needed for chest pain.   ondansetron (ZOFRAN) 4 MG tablet Take 1 tablet (4 mg total) by mouth every 8 (eight) hours as needed for nausea or vomiting. (Patient taking differently: Take 4 mg by mouth daily as needed for nausea or vomiting.)   polyethylene glycol-electrolytes (TRILYTE) 420 g solution Take 4,000 mLs by mouth as directed.  Probiotic Product (PROBIOTIC DAILY PO) Take 1 capsule by mouth daily.   senna (SENOKOT) 8.6 MG TABS tablet Take 1 tablet by mouth daily.   traMADol (ULTRAM) 50 MG tablet Take 1 tablet (50 mg total) by mouth every 12 (twelve) hours as needed for up to 15 days.   zolpidem (AMBIEN) 10 MG tablet TAKE ONE TABLET BY MOUTH DAILY AT BEDTIME. (Patient taking differently: Take 10 mg by mouth at bedtime.)   NON FORMULARY Take 1 tablet by mouth daily as needed (Stomach Bloat). FD GUARD (Patient not taking: Reported on 05/22/2021)   UNABLE TO FIND Place 1 application rectally daily as needed. [place] Name: lidocaine 5% pramoxine 1% ointment per rectum as needed.  (Patient not taking: Reported on 05/22/2021)   No facility-administered encounter medications on file as of 05/22/2021.    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   Rosacea 09/04/2015   Constipation    PE (pulmonary thromboembolism) ([place]) 07/26/2015   DDD (degenerative disc disease), lumbosacral 02/13/2015   Depression 02/12/2015   Vaginal atrophy 11/14/2014   Hearing loss 11/15/2013   Knee pain 09/05/2013   Lung nodule 08/14/2013   Nodule of tendon sheath 08/14/2013   Hemorrhoids, internal, with bleeding 04/05/2013   Hyperlipidemia 73/42/8768   Nonalcoholic steatohepatitis (NASH) 02/28/2013   Essential hypertension, benign 02/07/2013   Class 1 obesity due to excess calories with serious comorbidity and body mass index (BMI) of 33.0 to 33.9 in adult 02/07/2013   GERD (gastroesophageal reflux disease) 02/07/2013   Rectal bleeding 11/02/2012   CKD (chronic kidney disease), stage II 09/21/2012   Asthma 08/25/2012   Incontinence overflow, stress female 08/25/2012   Anxiety 08/25/2012   Chronic insomnia 08/25/2012   Irritable bowel syndrome 12/10/2010    Conditions to be addressed/monitored:HTN and CKD Stage 2  Care Plan : Chronic Kidney (Adult)  Updates made by Kassie Mends, RN since 05/22/2021 12:00 AM     Problem: Disease Progression   Priority: Medium     Long-Range Goal: Disease Progression  Prevented or Minimized   Start Date: 05/22/2021  Expected End Date: 11/22/2021  This Visit's Progress: On track  Priority: Medium  Note:   Current Barriers:  Ineffective Self Health Maintenance in a patient with CKD Stage 2.  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 Patient reports she has a Life Alert necklace that came with her security system but sometimes has trouble with picking up a signal so she does not routinely use.  Patient reports she does not qualify for medicaid and would like referral for  Care Guide for any additional resources she may could benefit from, does not feel social work is in order at this time but may be in the future. Patient reports she would "like to get out of the house on a more regular basis". Unable to independently drive, pt reports she has never driven and relies on her neighbor/ friend, medications are delivered from [place] and uses [employer] for groceries, utilizes [employer] for provider appointments. Clinical Goal(s):  Collaboration with Lindell Spar, MD regarding development and update of comprehensive plan of care as evidenced by provider attestation and co-signature Inter-disciplinary care team collaboration (see longitudinal plan of care) patient will work with care management team to address care coordination and chronic disease management needs related to Disease Management Educational Needs Care Coordination Medication Management and Education   Interventions:  Evaluation of current treatment plan related to CKD Stage 2, self-management and patient's adherence to plan as established by provider. Collaboration with  Lindell Spar, MD regarding development and update of comprehensive plan of care as evidenced by provider attestation       and co-signature Inter-disciplinary care team collaboration (see longitudinal plan of care) Discussed plans with patient for ongoing care management follow up and provided patient with direct contact information for care management team Education via My Chart- Chronic Kidney Disease Discussed with patient importance of keeping blood pressure within normal limits, eating heatlhy diet and drinking adequate fluids, taking medications as prescribed and overall healthy lifestyle to preserve kidney function. Referral to Care Guide for any additional resources pt may benefit from such as community activities [place]:  Attends all scheduled provider appointments Calls provider office for new concerns or questions Patient Goals: - call for medicine refill 2 or 3 days before it runs out - keep follow-up appointments 11/9 Roseanne Kaufman NP, 12/16 primary care provider Dr. Posey Pronto,  12/28 Dr. Dorris Fetch - education material sent via My Chart- Chronic Kidney Disease - please keep blood pressure within normal limits - continue eating a healthy diet - please expect a call from [place] for additional resources Follow Up Plan: Telephone follow up appointment with care management team member scheduled for:   07/08/2021    Care Plan : Hypertension (Adult)  Updates made by Kassie Mends, RN since 05/22/2021 12:00 AM     Problem: Hypertension (Hypertension)   Priority: Medium     Long-Range Goal: Hypertension Monitored   Start Date: 05/22/2021  Expected End Date: 11/22/2021  This Visit's Progress: On track  Priority: Medium  Note:   Objective:  Last practice recorded BP readings:  BP Readings from Last 3 Encounters:  05/16/21 121/77  04/21/21 (!) 97/44  03/31/21 120/74   Most recent eGFR/CrCl: No results found for: EGFR  No components found for: CRCL Current Barriers:   Knowledge Deficits related to basic understanding of hypertension pathophysiology and self care management- patient checks blood pressure but not always consistently, reports her blood pressure has been good. 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  Patient agreeable for care guide to contact her for any resources.  Patient reports she does not qualify for medicaid and has looked into (visited) [facility] and feels transportation for this  was a barrier as they would not come and pickup patient and pt not so sure this would be a good fit for her. Case Manager Clinical Goal(s):  patient will verbalize understanding of plan for hypertension management patient will demonstrate improved adherence to prescribed treatment plan for hypertension as evidenced by taking all medications as prescribed, monitoring and recording blood pressure as directed, adhering to low sodium/DASH diet Interventions:  Collaboration with Lindell Spar, MD regarding development and update of comprehensive plan of care as evidenced by provider attestation and co-signature Inter-disciplinary care team collaboration (see longitudinal plan of care) Evaluation of current treatment plan related to hypertension self management and patient's adherence to plan as established by provider. Provided education to patient re: stroke prevention, s/s of heart attack and stroke, DASH diet, complications of uncontrolled blood pressure Reviewed medications with patient and discussed importance of compliance Discussed plans with patient for ongoing care management follow up and provided patient with direct contact information for care management  team Reviewed scheduled/upcoming provider appointments including: 11/9 Roseanne Kaufman NP, 12/16 Dr. Posey Pronto,  12/28  Dr. Dorris Fetch Education sent via My Chart- Low sodium diet Reviewed importance of getting outside and doing some type of exercise as patient is able Referral sent to Care Guide for social/ community connections Reviewed safety precautions, importance of using Life Alert necklace and make sure it is working properly Self-Care Activities: Attends all scheduled provider appointments Calls provider office for new concerns, questions, or BP outside discussed parameters Checks BP and records as discussed Follows a low sodium diet/DASH diet Patient Goals: - check blood pressure 3 times per week - write blood pressure results in a log or diary  - follow low salt diet- avoid salty snacks and fast food - look over education sent via My Chart- low sodium diet - try to get outside everyday and do some type of exercise- continue working in your garden and flowers - take all medications as prescribed - keep pathways clear and free of clutter so you do not fall - call company ([employer]) and report any issues with your alert system not picking up signal well Follow Up Plan: Telephone follow up appointment with care management team member scheduled for:  07/08/2021      Plan:Telephone follow up appointment with care management team member scheduled for:  07/08/2021  Jacqlyn Larsen [facility], BSN RN Case Manager [facility] [phone]

## 2021-05-22 NOTE — Patient Instructions (Addendum)
Visit Information   PATIENT GOALS:   Goals Addressed             This Visit's Progress    Follow My Treatment Plan-Chronic Kidney       Timeframe:  Long-Range Goal Priority:  Medium Start Date:     05/22/2021                        Expected End Date:        11/22/2021               Follow Up Date - 07/08/2021   - call for medicine refill 2 or 3 days before it runs out - keep follow-up appointments 11/9 Joanne Kaufman NP, 12/16 primary care provider Dr. Posey Pronto,  12/28 Dr. Dorris Fetch - education material sent via My Chart- Chronic Kidney Disease - please keep blood pressure within normal limits - continue eating a healthy diet - please expect a call from [place] for additional resources   Why is this important?   Staying as healthy as you can is very important. This may mean making changes if you smoke, don't exercise or eat poorly.  A healthy lifestyle is an important goal for you.  Following the treatment plan and making changes may be hard.  Try some of these steps to help keep the disease from getting worse.     Notes:      Track and Manage My Blood Pressure-Hypertension       Timeframe:  Long-Range Goal Priority:  Medium Start Date:    05/22/2021                         Expected End Date:       11/22/2021                Follow Up Date- 07/08/2021   - check blood pressure 3 times per week - write blood pressure results in a log or diary  - follow low salt diet- avoid salty snacks and fast food - look over education sent via My Chart- low sodium diet - try to get outside everyday and do some type of exercise- continue working in your garden and flowers - take all medications as prescribed - keep pathways clear and free of clutter so you do not fall - call company ([employer]) and report any issues with your alert system not picking up signal well   Why is this important?   You won't feel high blood pressure, but it can still hurt your blood vessels.  High blood pressure can  cause heart or kidney problems. It can also cause a stroke.  Making lifestyle changes like losing a little weight or eating less salt will help.  Checking your blood pressure at home and at different times of the day can help to control blood pressure.  If the doctor prescribes medicine remember to take it the way the doctor ordered.  Call the office if you cannot afford the medicine or if there are questions about it.     Notes:         Consent to CCM Services: Ms. Guzzetta was given information about Chronic Care Management services including:  CCM service includes personalized support from designated clinical staff supervised by her physician, including individualized plan of care and coordination with other care providers 24/7 contact phone numbers for assistance for urgent and routine care needs. Service will only  be billed when office clinical staff spend 20 minutes or more in a month to coordinate care. Only one practitioner may furnish and bill the service in a calendar month. The patient may stop CCM services at any time (effective at the end of the month) by phone call to the office staff. The patient will be responsible for cost sharing (co-pay) of up to 20% of the service fee (after annual deductible is met).  Patient agreed to services and verbal consent obtained.   Patient verbalizes understanding of instructions provided today and agrees to view in [place].   Telephone follow up appointment with care management team member scheduled for:  10/4/2022Heart-Healthy Eating Plan Heart-healthy meal planning includes: Eating less unhealthy fats. Eating more healthy fats. Making other changes in your diet. Talk with your doctor or a diet specialist (dietitian) to create an eating plan that is right for you. What is my plan? Your doctor may recommend an eating plan that includes: Total fat: ______% or less of total calories a day. Saturated fat: ______% or less of total  calories a day. Cholesterol: less than _________mg a day. What are tips for following this plan? Cooking Avoid frying your food. Try to bake, boil, grill, or broil it instead. You can also reduce fat by: Removing the skin from poultry. Removing all visible fats from meats. Steaming vegetables in water or broth. Meal planning  At meals, divide your plate into four equal parts: Fill one-half of your plate with vegetables and green salads. Fill one-fourth of your plate with whole grains. Fill one-fourth of your plate with lean protein foods. Eat 4-5 servings of vegetables per day. A serving of vegetables is: 1 cup of raw or cooked vegetables. 2 cups of raw leafy greens. Eat 4-5 servings of fruit per day. A serving of fruit is: 1 medium whole fruit.  cup of dried fruit.  cup of fresh, frozen, or canned fruit.  cup of 100% fruit juice. Eat more foods that have soluble fiber. These are apples, broccoli, carrots, beans, peas, and barley. Try to get 20-30 g of fiber per day. Eat 4-5 servings of nuts, legumes, and seeds per week: 1 serving of dried beans or legumes equals  cup after being cooked. 1 serving of nuts is  cup. 1 serving of seeds equals 1 tablespoon.  General information Eat more home-cooked food. Eat less restaurant, buffet, and fast food. Limit or avoid alcohol. Limit foods that are high in starch and sugar. Avoid fried foods. Lose weight if you are overweight. Keep track of how much salt (sodium) you eat. This is important if you have high blood pressure. Ask your doctor to tell you more about this. Try to add vegetarian meals each week. Fats Choose healthy fats. These include olive oil and canola oil, flaxseeds, walnuts, almonds, and seeds. Eat more omega-3 fats. These include salmon, mackerel, sardines, tuna, flaxseed oil, and ground flaxseeds. Try to eat fish at least 2 times each week. Check food labels. Avoid foods with trans fats or high amounts of saturated  fat. Limit saturated fats. These are often found in animal products, such as meats, butter, and cream. These are also found in plant foods, such as palm oil, palm kernel oil, and coconut oil. Avoid foods with partially hydrogenated oils in them. These have trans fats. Examples are stick margarine, some tub margarines, cookies, crackers, and other baked goods. What foods can I eat? Fruits All fresh, canned (in natural juice), or frozen fruits. Vegetables Fresh or  frozen vegetables (raw, steamed, roasted, or grilled). Green salads. Grains Most grains. Choose whole wheat and whole grains most of the time. Rice andpasta, including brown rice and pastas made with whole wheat. Meats and other proteins Lean, well-trimmed beef, veal, pork, and lamb. Chicken and [place] without skin. All fish and shellfish. Wild duck, rabbit, pheasant, and venison. Egg whites or low-cholesterol egg substitutes. Dried beans, peas, lentils, and tofu. Seedsand most nuts. Dairy Low-fat or nonfat cheeses, including ricotta and mozzarella. Skim or 1% milk that is liquid, powdered, or evaporated. Buttermilk that is made with low-fatmilk. Nonfat or low-fat yogurt. Fats and oils Non-hydrogenated (trans-free) margarines. Vegetable oils, including soybean, sesame, sunflower, olive, peanut, safflower, corn, canola, and cottonseed. Salad dressings or mayonnaisemade with a vegetable oil. Beverages Mineral water. Coffee and tea. Diet carbonated beverages. Sweets and desserts Sherbet, gelatin, and fruit ice. Small amounts of dark chocolate. Limit all sweets and desserts. Seasonings and condiments All seasonings and condiments. The items listed above may not be a complete list of foods and drinks you can eat. Contact a dietitian for more options. What foods should I avoid? Fruits Canned fruit in heavy syrup. Fruit in cream or butter sauce. Fried fruit. Limitcoconut. Vegetables Vegetables cooked in cheese, cream, or butter sauce.  Fried vegetables. Grains Breads that are made with saturated or trans fats, oils, or whole milk. Croissants. Sweet rolls. Donuts. High-fat crackers,such as cheese crackers. Meats and other proteins Fatty meats, such as hot dogs, ribs, sausage, bacon, rib-eye roast or steak. High-fat deli meats, such as salami and bologna. Caviar. Domestic duck andgoose. Organ meats, such as liver. Dairy Cream, sour cream, cream cheese, and creamed cottage cheese. Whole-milk cheeses. Whole or 2% milk that is liquid, evaporated, or condensed. Whole buttermilk. Cream sauce or high-fat cheese sauce. Yogurt that is made fromwhole milk. Fats and oils Meat fat, or shortening. Cocoa butter, hydrogenated oils, palm oil, coconut oil, palm kernel oil. Solid fats and shortenings, including bacon fat, salt pork, lard, and butter. Nondairy cream substitutes. Salad dressings with cheeseor sour cream. Beverages Regular sodas and juice drinks with added sugar. Sweets and desserts Frosting. Pudding. Cookies. Cakes. Pies. Milk chocolate or white chocolate.Buttered syrups. Full-fat ice cream or ice cream drinks. The items listed above may not be a complete list of foods and drinks to avoid. Contact a dietitian for more information. Summary Heart-healthy meal planning includes eating less unhealthy fats, eating more healthy fats, and making other changes in your diet. Eat a balanced diet. This includes fruits and vegetables, low-fat or nonfat dairy, lean protein, nuts and legumes, whole grains, and heart-healthy oils and fats. This information is not intended to replace advice given to you by your health care provider. Make sure you discuss any questions you have with your healthcare provider. Document Revised: 11/25/2017 Document Reviewed: 10/29/2017 Elsevier Patient Education  2022 [place]. Low-Sodium Eating Plan Sodium, which is an element that makes up salt, helps you maintain a healthy balance of fluids in your body. Too  much sodium can increase your bloodpressure and cause fluid and waste to be held in your body. Your health care provider or dietitian may recommend following this plan if you have high blood pressure (hypertension), kidney disease, liver disease, or heart failure. Eating less sodium can help lower your blood pressure, reduce swelling, and protect your heart, liver, andkidneys. What are tips for following this plan? Reading food labels The Nutrition Facts label lists the amount of sodium in one serving of the food. If you  eat more than one serving, you must multiply the listed amount of sodium by the number of servings. Choose foods with less than 140 mg of sodium per serving. Avoid foods with 300 mg of sodium or more per serving. Shopping  Look for lower-sodium products, often labeled as "low-sodium" or "no salt added." Always check the sodium content, even if foods are labeled as "unsalted" or "no salt added." Buy fresh foods. Avoid canned foods and pre-made or frozen meals. Avoid canned, cured, or processed meats. Buy breads that have less than 80 mg of sodium per slice.  Cooking  Eat more home-cooked food and less restaurant, buffet, and fast food. Avoid adding salt when cooking. Use salt-free seasonings or herbs instead of table salt or sea salt. Check with your health care provider or pharmacist before using salt substitutes. Cook with plant-based oils, such as canola, sunflower, or olive oil.  Meal planning When eating at a restaurant, ask that your food be prepared with less salt or no salt, if possible. Avoid dishes labeled as brined, pickled, cured, smoked, or made with soy sauce, miso, or teriyaki sauce. Avoid foods that contain MSG (monosodium glutamate). MSG is sometimes added to [place] food, bouillon, and some canned foods. Make meals that can be grilled, baked, poached, roasted, or steamed. These are generally made with less sodium. General information Most people on this  plan should limit their sodium intake to 1,500-2,000 mg (milligrams) of sodium each day. What foods should I eat? Fruits Fresh, frozen, or canned fruit. Fruit juice. Vegetables Fresh or frozen vegetables. "No salt added" canned vegetables. "No salt added"tomato sauce and paste. Low-sodium or reduced-sodium tomato and vegetable juice. Grains Low-sodium cereals, including oats, puffed wheat and rice, and shredded wheat. Low-sodium crackers. Unsalted rice. Unsalted pasta. Low-sodium bread.Whole-grain breads and whole-grain pasta. Meats and other proteins Fresh or frozen (no salt added) meat, poultry, seafood, and fish. Low-sodium canned tuna and salmon. Unsalted nuts. Dried peas, beans, and lentils withoutadded salt. Unsalted canned beans. Eggs. Unsalted nut butters. Dairy Milk. Soy milk. Cheese that is naturally low in sodium, such as ricotta cheese, fresh mozzarella, or Swiss cheese. Low-sodium or reduced-sodium cheese. Creamcheese. Yogurt. Seasonings and condiments Fresh and dried herbs and spices. Salt-free seasonings. Low-sodium mustard and ketchup. Sodium-free salad dressing. Sodium-free light mayonnaise. Fresh orrefrigerated horseradish. Lemon juice. Vinegar. Other foods Homemade, reduced-sodium, or low-sodium soups. Unsalted popcorn and pretzels.Low-salt or salt-free chips. The items listed above may not be a complete list of foods and beverages you can eat. Contact a dietitian for more information. What foods should I avoid? Vegetables Sauerkraut, pickled vegetables, and relishes. Olives. [place] fries. Onion rings. Regular canned vegetables (not low-sodium or reduced-sodium). Regular canned tomato sauce and paste (not low-sodium or reduced-sodium). Regular tomato and vegetable juice (not low-sodium or reduced-sodium). Frozenvegetables in sauces. Grains Instant hot cereals. Bread stuffing, pancake, and biscuit mixes. Croutons. Seasoned rice or pasta mixes. Noodle soup cups. Boxed or frozen  macaroni andcheese. Regular salted crackers. Self-rising flour. Meats and other proteins Meat or fish that is salted, canned, smoked, spiced, or pickled. Precooked or cured meat, such as sausages or meat loaves. Berniece Salines. Ham. Pepperoni. Hot dogs. Corned beef. Chipped beef. Salt pork. Jerky. Pickled herring. Anchovies andsardines. Regular canned tuna. Salted nuts. Dairy Processed cheese and cheese spreads. Hard cheeses. Cheese curds. Blue cheese.Feta cheese. String cheese. Regular cottage cheese. Buttermilk. Canned milk. Fats and oils Salted butter. Regular margarine. Ghee. Bacon fat. Seasonings and condiments Onion salt, garlic salt, seasoned salt, table salt, and  sea salt. Canned and packaged gravies. Worcestershire sauce. Tartar sauce. Barbecue sauce. Teriyaki sauce. Soy sauce, including reduced-sodium. Steak sauce. Fish sauce. Oyster sauce. Cocktail sauce. Horseradish that you find on the shelf. Regular ketchup and mustard. Meat flavorings and tenderizers. Bouillon cubes. Hot sauce. Pre-made or packaged marinades. Pre-made or packaged taco seasonings. Relishes.Regular salad dressings. Salsa. Other foods Salted popcorn and pretzels. Corn chips and puffs. Potato and tortilla chips.Canned or dried soups. Pizza. Frozen entrees and pot pies. The items listed above may not be a complete list of foods and beverages you should avoid. Contact a dietitian for more information. Summary Eating less sodium can help lower your blood pressure, reduce swelling, and protect your heart, liver, and kidneys. Most people on this plan should limit their sodium intake to 1,500-2,000 mg (milligrams) of sodium each day. Canned, boxed, and frozen foods are high in sodium. Restaurant foods, fast foods, and pizza are also very high in sodium. You also get sodium by adding salt to food. Try to cook at home, eat more fresh fruits and vegetables, and eat less fast food and canned, processed, or prepared foods. This information  is not intended to replace advice given to you by your health care provider. Make sure you discuss any questions you have with your healthcare provider. Document Revised: 10/27/2019 Document Reviewed: 08/23/2019 Elsevier Patient Education  2022 [place] [facility], BSN RN Case Manager [facility] [phone]   CLINICAL CARE PLAN: Patient Care Plan: Chronic Kidney (Adult)     Problem Identified: Disease Progression   Priority: Medium     Long-Range Goal: Disease Progression Prevented or Minimized   Start Date: 05/22/2021  Expected End Date: 11/22/2021  This Visit's Progress: On track  Priority: Medium  Note:   Current Barriers:  Ineffective Self Health Maintenance in a patient with CKD Stage 2.  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 Patient reports she has a Life Alert necklace that came with her security system but sometimes has trouble with picking up a signal so she does not routinely use.  Patient reports she does not qualify for medicaid and would like referral for Care Guide for any additional resources she may could benefit from, does not feel social work is in order at this time but may be in the future. Patient reports she would "like to get out of the house on a more regular basis". Unable to independently drive, pt reports she has never driven and relies on her neighbor/ friend, medications are delivered from [facility] and uses [employer] for groceries, utilizes [facility] for provider appointments. Clinical Goal(s):  Collaboration with Lindell Spar, MD regarding development and update of comprehensive plan of care  as evidenced by provider attestation and co-signature Inter-disciplinary care team collaboration (see longitudinal plan of care) patient will work with care management team to address care coordination and chronic disease management needs related to Disease Management Educational Needs Care Coordination Medication Management and Education   Interventions:  Evaluation of current treatment plan related to CKD Stage 2, self-management and patient's adherence to plan as established by provider. Collaboration with Lindell Spar, MD regarding  development and update of comprehensive plan of care as evidenced by provider attestation       and co-signature Inter-disciplinary care team collaboration (see longitudinal plan of care) Discussed plans with patient for ongoing care management follow up and provided patient with direct contact information for care management team Education via My Chart- Chronic Kidney Disease Discussed with patient importance of keeping blood pressure within normal limits, eating heatlhy diet and drinking adequate fluids, taking medications as prescribed and overall healthy lifestyle to preserve kidney function. Referral to Care Guide for any additional resources pt may benefit from such as community activities [place]:  Attends all scheduled provider appointments Calls provider office for new concerns or questions Patient Goals: - call for medicine refill 2 or 3 days before it runs out - keep follow-up appointments 11/9 Joanne Kaufman NP, 12/16 primary care provider Dr. Posey Pronto,  12/28 Dr. Dorris Fetch - education material sent via My Chart- Chronic Kidney Disease - please keep blood pressure within normal limits - continue eating a healthy diet - please expect a call from [place] for additional resources Follow Up Plan: Telephone follow up appointment with care management team member scheduled for:   07/08/2021    Patient Care Plan: Hypertension (Adult)      Problem Identified: Hypertension (Hypertension)   Priority: Medium     Long-Range Goal: Hypertension Monitored   Start Date: 05/22/2021  Expected End Date: 11/22/2021  This Visit's Progress: On track  Priority: Medium  Note:   Objective:  Last practice recorded BP readings:  BP Readings from Last 3 Encounters:  05/16/21 121/77  04/21/21 (!) 97/44  03/31/21 120/74   Most recent eGFR/CrCl: No results found for: EGFR  No components found for: CRCL Current Barriers:  Knowledge Deficits related to basic understanding of hypertension pathophysiology and self care management- patient checks blood pressure but not always consistently, reports her blood pressure has been good. 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  Patient agreeable for care guide to contact her for any resources.  Patient reports she does not qualify for medicaid and has looked into (visited) [facility] and feels transportation for this was a barrier as they would not come and pickup patient and pt not so sure this would be a good fit for her. Case Manager Clinical Goal(s):  patient will verbalize understanding of plan for hypertension management patient will demonstrate improved adherence to prescribed treatment plan for hypertension as evidenced by taking all medications as prescribed, monitoring and recording blood pressure as directed, adhering to low sodium/DASH diet Interventions:  Collaboration with Lindell Spar, MD regarding development and update of comprehensive plan of care as evidenced by provider attestation and co-signature Inter-disciplinary care team collaboration (see longitudinal plan of  care) Evaluation of current treatment plan related to hypertension self management and patient's adherence to plan as established by provider. Provided education to patient re: stroke prevention, s/s of heart attack and stroke, DASH diet, complications of uncontrolled blood pressure Reviewed medications with patient and discussed importance of compliance Discussed plans with patient for ongoing care management follow up and provided patient with direct contact information for care management team Reviewed scheduled/upcoming provider appointments including: 11/9 Joanne Kaufman NP, 12/16 Dr. Posey Pronto,  12/28  Dr. Dorris Fetch Education sent via My Chart- Low sodium diet Reviewed importance of getting outside and doing some type of exercise as patient is able Referral sent to Care Guide for social/ community connections Reviewed safety precautions, importance of using Life Alert necklace and make sure it is working properly Self-Care Activities: Attends all scheduled provider appointments Calls provider office for new concerns, questions, or BP outside discussed parameters Checks BP and records as discussed Follows a low sodium diet/DASH diet Patient Goals: - check blood pressure 3 times per week - write blood pressure results in a log or diary  - follow low salt diet- avoid salty snacks and fast food - look over education sent via My Chart- low sodium diet - try to get outside everyday and do some type of exercise- continue working in your garden and flowers - take all medications as prescribed - keep pathways clear and free of clutter so you do not fall - call company ([employer]) and report any issues with your alert system not picking up signal well Follow Up Plan: Telephone follow up appointment with care management team member scheduled for:  07/08/2021

## 2021-05-26 ENCOUNTER — Telehealth: Payer: Self-pay | Admitting: *Deleted

## 2021-05-26 NOTE — Telephone Encounter (Signed)
   Telephone encounter was:  Successful.  05/26/2021 Name: [name] MRN: [id number] DOB: 12-Nov-1950  [name] is a 70 y.o. year old female who is a primary care patient of Lindell Spar, MD . The community resource team was consulted for assistance with Transportation Needs patient is loosing vision , she was refered to aging disability , resources for social and transportation, she is using [employer] from [place] are suggested . Marland Kitchen  Care guide performed the following interventions: Patient provided with information about care guide support team and interviewed to confirm resource needs.  Follow Up Plan:  Care guide will follow up with patient by phone over the next days   [place], Care Management  [phone] [address] , [place] [address] Email : [email or web address]

## 2021-05-28 ENCOUNTER — Other Ambulatory Visit (HOSPITAL_COMMUNITY): Payer: Self-pay | Admitting: Internal Medicine

## 2021-05-28 DIAGNOSIS — Z1231 Encounter for screening mammogram for malignant neoplasm of breast: Secondary | ICD-10-CM

## 2021-06-03 ENCOUNTER — Telehealth: Payer: Self-pay | Admitting: *Deleted

## 2021-06-03 NOTE — Telephone Encounter (Signed)
   Telephone encounter was:  Successful.  06/03/2021 Name: [name] MRN: [id number] DOB: 17-Jun-1951  [name] is a 70 y.o. year old female who is a primary care patient of Lindell Spar, MD . The community resource team was consulted for assistance with Transportation Needs , Food Insecurity, and Home Modificationspatient is using the provided information to get the resources she needs and at this time has no further community needs will reach out if she has additional needs   Care guide performed the following interventions: Patient provided with information about care guide support team and interviewed to confirm resource needs Follow up call placed to community resources to determine status of patients referral.  Follow Up Plan:  No further follow up planned at this time. The patient has been provided with needed resources. [facility]  [phone] [address] , [place] [address] Email : [email or web address]

## 2021-06-16 ENCOUNTER — Other Ambulatory Visit: Payer: Self-pay

## 2021-06-16 ENCOUNTER — Ambulatory Visit (INDEPENDENT_AMBULATORY_CARE_PROVIDER_SITE_OTHER): Payer: Medicare Other

## 2021-06-16 DIAGNOSIS — Z23 Encounter for immunization: Secondary | ICD-10-CM

## 2021-06-16 DIAGNOSIS — H35351 Cystoid macular degeneration, right eye: Secondary | ICD-10-CM | POA: Diagnosis not present

## 2021-06-17 DIAGNOSIS — Z20822 Contact with and (suspected) exposure to covid-19: Secondary | ICD-10-CM | POA: Diagnosis not present

## 2021-06-30 ENCOUNTER — Other Ambulatory Visit: Payer: Self-pay

## 2021-06-30 ENCOUNTER — Ambulatory Visit: Payer: Self-pay | Admitting: General Surgery

## 2021-06-30 ENCOUNTER — Ambulatory Visit (HOSPITAL_COMMUNITY)
Admission: RE | Admit: 2021-06-30 | Discharge: 2021-06-30 | Disposition: A | Discharge status: Home / Self Care | Payer: Medicare Other | Source: Ambulatory Visit | Attending: Internal Medicine | Admitting: Internal Medicine

## 2021-06-30 DIAGNOSIS — Z1231 Encounter for screening mammogram for malignant neoplasm of breast: Secondary | ICD-10-CM | POA: Diagnosis not present

## 2021-06-30 DIAGNOSIS — K601 Chronic anal fissure: Secondary | ICD-10-CM | POA: Diagnosis not present

## 2021-06-30 DIAGNOSIS — Z23 Encounter for immunization: Secondary | ICD-10-CM | POA: Diagnosis not present

## 2021-06-30 NOTE — H&P (View-Only) (Signed)
REFERRING PHYSICIAN:  Hurshel Keys, DO   PROVIDER:  Monico Blitz, MD   MRN: [id number] DOB: 1951/01/29 DATE OF ENCOUNTER: 06/30/2021   Subjective    Chief Complaint: Rectal Pain       History of Present Illness: [name] is a 70 y.o. female who is seen today as an office consultation at the request of Dr. 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  She states that this has been present for years.  Nitroglycerin ointment and lidocaine to control her symptoms but they do not does not resolve the problem.  She recently underwent a colonoscopy which also showed the fissure.  Patient states that she has had rectal pain with bowel movements since her hemorrhoidectomy in 2017.     Review of Systems: A complete review of systems was obtained from the patient.  I have reviewed this information and discussed as appropriate with the patient.  See HPI as well for other ROS.       Medical History:     Past Medical History:  Diagnosis Date   Anxiety     Arthritis     Asthma, unspecified asthma severity, unspecified whether complicated, unspecified whether persistent     Glaucoma (increased eye pressure)     Hyperlipidemia     Hypertension     Thyroid disease        There is no problem list on file for this patient.          Past Surgical History:  Procedure Laterality Date   APPENDECTOMY       cardiac catheterization       CATARACT EXTRACTION       CHOLECYSTECTOMY       COLONOSCOPY       EGD       FLEXIBLE SIGMOIDOSCOPY       heel spur excision       HEMORRHOID SURGERY   04/24/2016   HYSTERECTOMY       LAPAROSCOPIC TUBAL LIGATION       TONSILLECTOMY & ADENOIDECTOMY               Allergies  Allergen Reactions   Nutmeg Anaphylaxis      Nut oil- skin irritation. Nut oil- skin irritation.     Adhesive Tape-Silicones Other (See Comments)      Blisters skin   Aspirin  Other (See Comments)      Sickle cell trait. Not recommended except for emergencies. sickle cell trait--  Not recommended unless emergency sickle cell trait--  Not recommended unless emergency Sickle cell trait. Not recommended except for emergencies.     Cephalexin Other (See Comments) and Hives      hives G6PD hives     Levofloxacin Other (See Comments)      Altered mental status Altered mental status     Other Other (See Comments)      Nut Oil- skin irritation   Statins-Hmg-Coa Reductase Inhibitors Other (See Comments)      Myopathy - elevated CK Myopathy-elevated CK     Sulfa (Sulfonamide Antibiotics) Other (See Comments)      Patient has sickle cell trait Sickle cell trait.     Sumatriptan Hives   Latex, Natural Rubber Rash      rash rash              Current Outpatient Medications on File Prior to Visit  Medication Sig Dispense  Refill   albuterol (PROVENTIL) 2.5 mg /3 mL (0.083 %) nebulizer solution Inhale 2.5 mg into the lungs every 4 (four) hours as needed       albuterol 90 mcg/actuation inhaler Inhale 2 inhalations into the lungs every 4 (four) hours as needed       ALPRAZolam (XANAX) 1 MG tablet Take 1 mg by mouth 2 (two) times daily as needed       brimonidine (ALPHAGAN) 0.2 % ophthalmic solution Apply 1 drop to eye 2 (two) times daily       budesonide-formoteroL (SYMBICORT) 160-4.5 mcg/actuation inhaler Inhale into the lungs as needed       cyclobenzaprine (FLEXERIL) 10 MG tablet as needed       dexlansoprazole (DEXILANT) 60 mg DR capsule Take by mouth       dicyclomine (BENTYL) 10 mg capsule as needed       difluprednate (DUREZOL) 0.05 % ophthalmic emulsion Apply to eye       estradioL (ESTRACE) 0.01 % (0.1 mg/gram) vaginal cream Place vaginally as needed       fenofibrate nanocrystallized (TRICOR) 145 MG tablet Take by mouth       irbesartan (AVAPRO) 150 MG tablet Take by mouth       latanoprost (XALATAN) 0.005 % ophthalmic solution Apply 1 drop to eye at  bedtime       levothyroxine (SYNTHROID) 25 MCG tablet Take by mouth       lidocaine-hydrocortisone-aloe 3-2.5 % (7 gram) Kit as needed       lifitegrast (XIIDRA) 5 % ophthalmic solution INSTILL 1 DROP IN BOTH EYES EVERY DAY AS NEEDED (EACH CONTAINER SHOULD YIELD 2 DROPS) STORE IN ORIGINAL FOIL POUCH       meclizine (ANTIVERT) 12.5 mg tablet Take 12.5 mg by mouth 3 (three) times daily as needed       metroNIDAZOLE (METROCREAM) 0.75 % cream Apply topically as needed       neomycin-polymyxin-dexAMETHasone (MAXITROL) ophthalmic ointment Apply to eye       nitroGLYcerin (NITROLINGUAL) 400 mcg/spray spray Place under the tongue as needed       ondansetron (ZOFRAN) 4 MG tablet Take 4 mg by mouth every 8 (eight) hours as needed       zolpidem (AMBIEN) 10 mg tablet Take 1 tablet by mouth at bedtime       cholecalciferol (VITAMIN D3) 2,000 unit tablet Take by mouth       HYDROcodone-acetaminophen (NORCO) 5-325 mg tablet Take 1 tablet by mouth every 6 (six) hours as needed       hydrocodone-chlorpheniramine (TUSSIONEX) 10-8 mg/5 mL ER suspension as needed       hyoscyamine 0.125 mg disintegrating tablet Place under the tongue as needed       Lactobac no.41/Bifidobact no.7 (PROBIOTIC-10 ORAL) Take 1 capsule by mouth once daily       menthol 5 % Gel Apply topically as needed       naproxen (NAPROSYN) 500 MG tablet Take by mouth as needed       NON FORMULARY Take by mouth       sennosides (SENOKOT) 8.6 mg tablet Take 1 tablet by mouth once daily       traMADoL (ULTRAM) 50 mg tablet          No current facility-administered medications on file prior to visit.           Family History  Problem Relation Age of Onset   High blood pressure (Hypertension) Mother  Colon cancer Mother        Social History       Tobacco Use  Smoking Status Former Smoker  Smokeless Tobacco Never Used      Social History        Socioeconomic History   Marital status: Widowed  Tobacco Use   Smoking status:  Former Smoker   Smokeless tobacco: Never Used  Scientific laboratory technician Use: Never used  Substance and Sexual Activity   Alcohol use: Yes   Drug use: Never      Objective:         Vitals:    06/30/21 1508  BP: 138/82  Pulse: 99  Temp: 36.7 C (98 F)  SpO2: 94%  Weight: 95.9 kg (211 lb 6.4 oz)  Height: 170.2 cm (_0 )      Exam Gen: NAD Abd: soft Rectal: anal fissure       Assessment and Plan:  Diagnoses and all orders for this visit:   Chronic anal fissure     Patient has continued to have rectal pain after hemorrhoidectomy in 2017.  She has had an anal fissure for several years.  It appears to be intermittently causing her difficulty.  Nitroglycerin ointment helps but does not resolve the issue.  I have recommended chemical sphincterotomy with Botox.  We discussed the 80% healing rate and typical risk of procedure.  We discussed the risk of incontinence and the fact that this would be temporary.  Patient agrees to proceed.

## 2021-06-30 NOTE — H&P (Signed)
REFERRING PHYSICIAN:  Hurshel Keys, DO   PROVIDER:  Monico Blitz, MD   MRN: [id number] DOB: 1951/01/29 DATE OF ENCOUNTER: 06/30/2021   Subjective    Chief Complaint: Rectal Pain       History of Present Illness: [name] is a 70 y.o. female who is seen today as an office consultation at the request of Dr. 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  She states that this has been present for years.  Nitroglycerin ointment and lidocaine to control her symptoms but they do not does not resolve the problem.  She recently underwent a colonoscopy which also showed the fissure.  Patient states that she has had rectal pain with bowel movements since her hemorrhoidectomy in 2017.     Review of Systems: A complete review of systems was obtained from the patient.  I have reviewed this information and discussed as appropriate with the patient.  See HPI as well for other ROS.       Medical History:     Past Medical History:  Diagnosis Date   Anxiety     Arthritis     Asthma, unspecified asthma severity, unspecified whether complicated, unspecified whether persistent     Glaucoma (increased eye pressure)     Hyperlipidemia     Hypertension     Thyroid disease        There is no problem list on file for this patient.          Past Surgical History:  Procedure Laterality Date   APPENDECTOMY       cardiac catheterization       CATARACT EXTRACTION       CHOLECYSTECTOMY       COLONOSCOPY       EGD       FLEXIBLE SIGMOIDOSCOPY       heel spur excision       HEMORRHOID SURGERY   04/24/2016   HYSTERECTOMY       LAPAROSCOPIC TUBAL LIGATION       TONSILLECTOMY & ADENOIDECTOMY               Allergies  Allergen Reactions   Nutmeg Anaphylaxis      Nut oil- skin irritation. Nut oil- skin irritation.     Adhesive Tape-Silicones Other (See Comments)      Blisters skin   Aspirin  Other (See Comments)      Sickle cell trait. Not recommended except for emergencies. sickle cell trait--  Not recommended unless emergency sickle cell trait--  Not recommended unless emergency Sickle cell trait. Not recommended except for emergencies.     Cephalexin Other (See Comments) and Hives      hives G6PD hives     Levofloxacin Other (See Comments)      Altered mental status Altered mental status     Other Other (See Comments)      Nut Oil- skin irritation   Statins-Hmg-Coa Reductase Inhibitors Other (See Comments)      Myopathy - elevated CK Myopathy-elevated CK     Sulfa (Sulfonamide Antibiotics) Other (See Comments)      Patient has sickle cell trait Sickle cell trait.     Sumatriptan Hives   Latex, Natural Rubber Rash      rash rash              Current Outpatient Medications on File Prior to Visit  Medication Sig Dispense  Refill   albuterol (PROVENTIL) 2.5 mg /3 mL (0.083 %) nebulizer solution Inhale 2.5 mg into the lungs every 4 (four) hours as needed       albuterol 90 mcg/actuation inhaler Inhale 2 inhalations into the lungs every 4 (four) hours as needed       ALPRAZolam (XANAX) 1 MG tablet Take 1 mg by mouth 2 (two) times daily as needed       brimonidine (ALPHAGAN) 0.2 % ophthalmic solution Apply 1 drop to eye 2 (two) times daily       budesonide-formoteroL (SYMBICORT) 160-4.5 mcg/actuation inhaler Inhale into the lungs as needed       cyclobenzaprine (FLEXERIL) 10 MG tablet as needed       dexlansoprazole (DEXILANT) 60 mg DR capsule Take by mouth       dicyclomine (BENTYL) 10 mg capsule as needed       difluprednate (DUREZOL) 0.05 % ophthalmic emulsion Apply to eye       estradioL (ESTRACE) 0.01 % (0.1 mg/gram) vaginal cream Place vaginally as needed       fenofibrate nanocrystallized (TRICOR) 145 MG tablet Take by mouth       irbesartan (AVAPRO) 150 MG tablet Take by mouth       latanoprost (XALATAN) 0.005 % ophthalmic solution Apply 1 drop to eye at  bedtime       levothyroxine (SYNTHROID) 25 MCG tablet Take by mouth       lidocaine-hydrocortisone-aloe 3-2.5 % (7 gram) Kit as needed       lifitegrast (XIIDRA) 5 % ophthalmic solution INSTILL 1 DROP IN BOTH EYES EVERY DAY AS NEEDED (EACH CONTAINER SHOULD YIELD 2 DROPS) STORE IN ORIGINAL FOIL POUCH       meclizine (ANTIVERT) 12.5 mg tablet Take 12.5 mg by mouth 3 (three) times daily as needed       metroNIDAZOLE (METROCREAM) 0.75 % cream Apply topically as needed       neomycin-polymyxin-dexAMETHasone (MAXITROL) ophthalmic ointment Apply to eye       nitroGLYcerin (NITROLINGUAL) 400 mcg/spray spray Place under the tongue as needed       ondansetron (ZOFRAN) 4 MG tablet Take 4 mg by mouth every 8 (eight) hours as needed       zolpidem (AMBIEN) 10 mg tablet Take 1 tablet by mouth at bedtime       cholecalciferol (VITAMIN D3) 2,000 unit tablet Take by mouth       HYDROcodone-acetaminophen (NORCO) 5-325 mg tablet Take 1 tablet by mouth every 6 (six) hours as needed       hydrocodone-chlorpheniramine (TUSSIONEX) 10-8 mg/5 mL ER suspension as needed       hyoscyamine 0.125 mg disintegrating tablet Place under the tongue as needed       Lactobac no.41/Bifidobact no.7 (PROBIOTIC-10 ORAL) Take 1 capsule by mouth once daily       menthol 5 % Gel Apply topically as needed       naproxen (NAPROSYN) 500 MG tablet Take by mouth as needed       NON FORMULARY Take by mouth       sennosides (SENOKOT) 8.6 mg tablet Take 1 tablet by mouth once daily       traMADoL (ULTRAM) 50 mg tablet          No current facility-administered medications on file prior to visit.           Family History  Problem Relation Age of Onset   High blood pressure (Hypertension) Mother  Colon cancer Mother        Social History       Tobacco Use  Smoking Status Former Smoker  Smokeless Tobacco Never Used      Social History        Socioeconomic History   Marital status: Widowed  Tobacco Use   Smoking status:  Former Smoker   Smokeless tobacco: Never Used  Scientific laboratory technician Use: Never used  Substance and Sexual Activity   Alcohol use: Yes   Drug use: Never      Objective:         Vitals:    06/30/21 1508  BP: 138/82  Pulse: 99  Temp: 36.7 C (98 F)  SpO2: 94%  Weight: 95.9 kg (211 lb 6.4 oz)  Height: 170.2 cm (_0 )      Exam Gen: NAD Abd: soft Rectal: anal fissure       Assessment and Plan:  Diagnoses and all orders for this visit:   Chronic anal fissure     Patient has continued to have rectal pain after hemorrhoidectomy in 2017.  She has had an anal fissure for several years.  It appears to be intermittently causing her difficulty.  Nitroglycerin ointment helps but does not resolve the issue.  I have recommended chemical sphincterotomy with Botox.  We discussed the 80% healing rate and typical risk of procedure.  We discussed the risk of incontinence and the fact that this would be temporary.  Patient agrees to proceed.

## 2021-07-08 ENCOUNTER — Telehealth: Payer: Medicare Other

## 2021-07-16 ENCOUNTER — Other Ambulatory Visit: Payer: Self-pay

## 2021-07-16 ENCOUNTER — Encounter (HOSPITAL_BASED_OUTPATIENT_CLINIC_OR_DEPARTMENT_OTHER): Payer: Self-pay | Admitting: General Surgery

## 2021-07-16 DIAGNOSIS — J189 Pneumonia, unspecified organism: Secondary | ICD-10-CM

## 2021-07-16 HISTORY — DX: Pneumonia, unspecified organism: J18.9

## 2021-07-16 NOTE — Progress Notes (Addendum)
Spoke w/ via phone for pre-op interview---patient Lab needs dos---- ISTAT            Lab results------01/30/21 EKG in chart COVID test -----patient states asymptomatic no test needed Arrive at -------0930 on 07/23/2021 NPO after MN NO Solid Food.  Clear liquids from MN until---0830 Med rec completed Medications to take morning of surgery -----Albuterol prn, proventil prn, xanax prn, alphagan eye drops, symbicort prn, flexeril prn, Dexilant, Tussionex prn, Anaspaz prn,Synthroid, Xiidra eye drops, antivert prn, zofran prn, Durezol eye drops Diabetic medication -----none Patient instructed no nail polish to be worn day of surgery Patient instructed to bring photo id and insurance card day of surgery Patient aware to have Driver (ride ) / caregiver    for 24 hours after surgery - Butch Penny sister-in-law Patient Special Instructions -----Bring inhalers Pre-Op special Istructions -----Patient is legally blind. She has limited vision in her right eye. Patient had a normal stress test on 02/04/2021. Cardiology instructed patient to follow-up with PCP. Stress test result and MD note in chart. Patient verbalized understanding of instructions that were given at this phone interview. Patient denies shortness of breath, chest pain, fever, cough at this phone interview.

## 2021-07-22 ENCOUNTER — Ambulatory Visit
Admission: RE | Admit: 2021-07-22 | Discharge: 2021-07-22 | Disposition: A | Discharge status: Home / Self Care | Payer: Medicare Other | Source: Ambulatory Visit | Attending: Family Medicine | Admitting: Family Medicine

## 2021-07-22 ENCOUNTER — Ambulatory Visit: Payer: Medicare Other

## 2021-07-22 ENCOUNTER — Other Ambulatory Visit: Payer: Self-pay

## 2021-07-22 VITALS — BP 111/71 | HR 90 | Temp 98.6°F | Resp 16

## 2021-07-22 DIAGNOSIS — M25551 Pain in right hip: Secondary | ICD-10-CM | POA: Diagnosis not present

## 2021-07-22 MED ORDER — PREDNISONE 20 MG PO TABS: 40.0000 mg | ORAL_TABLET | Freq: Every day | ORAL | 0 refills | Status: DC

## 2021-07-22 NOTE — ED Triage Notes (Signed)
Patient presents to Urgent Care with complaints of right leg and hip pain from a fall 2 weeks ago. Pt states she was treating pain with cold compresses, ibuprofen, Norco, and flexeril with some relief. Pain increases with movement and decreases when laying down.  Denies fever.

## 2021-07-23 ENCOUNTER — Encounter (HOSPITAL_BASED_OUTPATIENT_CLINIC_OR_DEPARTMENT_OTHER)
Admission: RE | Disposition: A | Discharge status: Home / Self Care | Payer: Self-pay | Source: Home / Self Care | Attending: General Surgery

## 2021-07-23 ENCOUNTER — Ambulatory Visit (HOSPITAL_BASED_OUTPATIENT_CLINIC_OR_DEPARTMENT_OTHER): Payer: Medicare Other | Admitting: Certified Registered Nurse Anesthetist

## 2021-07-23 ENCOUNTER — Ambulatory Visit (HOSPITAL_BASED_OUTPATIENT_CLINIC_OR_DEPARTMENT_OTHER)
Admission: RE | Admit: 2021-07-23 | Discharge: 2021-07-23 | Disposition: A | Discharge status: Home / Self Care | Payer: Medicare Other | Attending: General Surgery | Admitting: General Surgery

## 2021-07-23 ENCOUNTER — Encounter (HOSPITAL_BASED_OUTPATIENT_CLINIC_OR_DEPARTMENT_OTHER): Payer: Self-pay | Admitting: General Surgery

## 2021-07-23 DIAGNOSIS — Z882 Allergy status to sulfonamides status: Secondary | ICD-10-CM | POA: Diagnosis not present

## 2021-07-23 DIAGNOSIS — Z886 Allergy status to analgesic agent status: Secondary | ICD-10-CM | POA: Insufficient documentation

## 2021-07-23 DIAGNOSIS — Z888 Allergy status to other drugs, medicaments and biological substances status: Secondary | ICD-10-CM | POA: Insufficient documentation

## 2021-07-23 DIAGNOSIS — Z8719 Personal history of other diseases of the digestive system: Secondary | ICD-10-CM | POA: Insufficient documentation

## 2021-07-23 DIAGNOSIS — Z91040 Latex allergy status: Secondary | ICD-10-CM | POA: Diagnosis not present

## 2021-07-23 DIAGNOSIS — Z9049 Acquired absence of other specified parts of digestive tract: Secondary | ICD-10-CM | POA: Diagnosis not present

## 2021-07-23 DIAGNOSIS — K601 Chronic anal fissure: Secondary | ICD-10-CM | POA: Insufficient documentation

## 2021-07-23 DIAGNOSIS — Z87891 Personal history of nicotine dependence: Secondary | ICD-10-CM | POA: Insufficient documentation

## 2021-07-23 DIAGNOSIS — K219 Gastro-esophageal reflux disease without esophagitis: Secondary | ICD-10-CM | POA: Diagnosis not present

## 2021-07-23 DIAGNOSIS — Z9102 Food additives allergy status: Secondary | ICD-10-CM | POA: Insufficient documentation

## 2021-07-23 DIAGNOSIS — Z7951 Long term (current) use of inhaled steroids: Secondary | ICD-10-CM | POA: Diagnosis not present

## 2021-07-23 DIAGNOSIS — Z881 Allergy status to other antibiotic agents status: Secondary | ICD-10-CM | POA: Insufficient documentation

## 2021-07-23 DIAGNOSIS — E039 Hypothyroidism, unspecified: Secondary | ICD-10-CM | POA: Diagnosis not present

## 2021-07-23 DIAGNOSIS — E7800 Pure hypercholesterolemia, unspecified: Secondary | ICD-10-CM | POA: Diagnosis not present

## 2021-07-23 HISTORY — PX: RECTAL EXAM UNDER ANESTHESIA: SHX6399

## 2021-07-23 HISTORY — DX: Migraine with aura, not intractable, without status migrainosus: G43.109

## 2021-07-23 HISTORY — PX: SPHINCTEROTOMY: SHX5279

## 2021-07-23 LAB — POCT I-STAT, CHEM 8
BUN: 16 mg/dL (ref 8–23)
Calcium, Ion: 1.19 mmol/L (ref 1.15–1.40)
Chloride: 105 mmol/L (ref 98–111)
Creatinine, Ser: 1.3 mg/dL — ABNORMAL HIGH (ref 0.44–1.00)
Glucose, Bld: 125 mg/dL — ABNORMAL HIGH (ref 70–99)
HCT: 40 % (ref 36.0–46.0)
Hemoglobin: 13.6 g/dL (ref 12.0–15.0)
Potassium: 4 mmol/L (ref 3.5–5.1)
Sodium: 140 mmol/L (ref 135–145)
TCO2: 22 mmol/L (ref 22–32)

## 2021-07-23 SURGERY — SPHINCTEROTOMY, ANAL
Anesthesia: Monitor Anesthesia Care | Site: Rectum

## 2021-07-23 MED ORDER — OXYCODONE HCL 5 MG PO TABS: 5.0000 mg | ORAL_TABLET | Freq: Once | ORAL | Status: DC | PRN

## 2021-07-23 MED ORDER — SODIUM CHLORIDE 0.9% FLUSH: 3.0000 mL | INTRAVENOUS | Status: DC | PRN

## 2021-07-23 MED ORDER — PROPOFOL 10 MG/ML IV BOLUS
INTRAVENOUS | Status: DC | PRN
  Administered 2021-07-23 (×2): 20 mg via INTRAVENOUS

## 2021-07-23 MED ORDER — LIDOCAINE 2% (20 MG/ML) 5 ML SYRINGE
INTRAMUSCULAR | Status: AC
  Filled 2021-07-23: qty 5

## 2021-07-23 MED ORDER — FENTANYL CITRATE (PF) 100 MCG/2ML IJ SOLN
25.0000 ug | INTRAMUSCULAR | Status: DC | PRN
  Administered 2021-07-23: 25 ug via INTRAVENOUS
  Administered 2021-07-23: 50 ug via INTRAVENOUS
  Administered 2021-07-23: 25 ug via INTRAVENOUS

## 2021-07-23 MED ORDER — FENTANYL CITRATE (PF) 100 MCG/2ML IJ SOLN
INTRAMUSCULAR | Status: AC
  Filled 2021-07-23: qty 2

## 2021-07-23 MED ORDER — MIDAZOLAM HCL 5 MG/5ML IJ SOLN
INTRAMUSCULAR | Status: DC | PRN
  Administered 2021-07-23: 2 mg via INTRAVENOUS

## 2021-07-23 MED ORDER — FENTANYL CITRATE (PF) 250 MCG/5ML IJ SOLN
INTRAMUSCULAR | Status: DC | PRN
  Administered 2021-07-23: 50 ug via INTRAVENOUS
  Administered 2021-07-23: 25 ug via INTRAVENOUS

## 2021-07-23 MED ORDER — BUPIVACAINE-EPINEPHRINE 0.5% -1:200000 IJ SOLN
INTRAMUSCULAR | Status: DC | PRN
  Administered 2021-07-23: 30 mL

## 2021-07-23 MED ORDER — 0.9 % SODIUM CHLORIDE (POUR BTL) OPTIME
TOPICAL | Status: DC | PRN
  Administered 2021-07-23: 1000 mL

## 2021-07-23 MED ORDER — ACETAMINOPHEN 325 MG PO TABS: 650.0000 mg | ORAL_TABLET | ORAL | Status: DC | PRN

## 2021-07-23 MED ORDER — PROPOFOL 500 MG/50ML IV EMUL
INTRAVENOUS | Status: DC | PRN
  Administered 2021-07-23: 200 ug/kg/min via INTRAVENOUS

## 2021-07-23 MED ORDER — SODIUM CHLORIDE 0.9% FLUSH: 3.0000 mL | Freq: Two times a day (BID) | INTRAVENOUS | Status: DC

## 2021-07-23 MED ORDER — ACETAMINOPHEN 325 MG RE SUPP: 650.0000 mg | RECTAL | Status: DC | PRN

## 2021-07-23 MED ORDER — LIDOCAINE 2% (20 MG/ML) 5 ML SYRINGE
INTRAMUSCULAR | Status: DC | PRN
  Administered 2021-07-23: 100 mg via INTRAVENOUS

## 2021-07-23 MED ORDER — OXYCODONE HCL 5 MG PO TABS: 5.0000 mg | ORAL_TABLET | ORAL | Status: DC | PRN

## 2021-07-23 MED ORDER — LACTATED RINGERS IV SOLN: INTRAVENOUS | Status: DC

## 2021-07-23 MED ORDER — MIDAZOLAM HCL 2 MG/2ML IJ SOLN
INTRAMUSCULAR | Status: AC
  Filled 2021-07-23: qty 2

## 2021-07-23 MED ORDER — ONDANSETRON HCL 4 MG/2ML IJ SOLN
INTRAMUSCULAR | Status: DC | PRN
  Administered 2021-07-23: 4 mg via INTRAVENOUS

## 2021-07-23 MED ORDER — SODIUM CHLORIDE 0.9 % IV SOLN: 250.0000 mL | INTRAVENOUS | Status: DC | PRN

## 2021-07-23 MED ORDER — ONABOTULINUMTOXINA 100 UNITS IJ SOLR
INTRAMUSCULAR | Status: DC | PRN
  Administered 2021-07-23: 100 [IU] via INTRAMUSCULAR

## 2021-07-23 MED ORDER — TRAMADOL HCL 50 MG PO TABS: 50.0000 mg | ORAL_TABLET | Freq: Four times a day (QID) | ORAL | 0 refills | Status: DC | PRN

## 2021-07-23 MED ORDER — PROPOFOL 10 MG/ML IV BOLUS
INTRAVENOUS | Status: AC
  Filled 2021-07-23: qty 20

## 2021-07-23 MED ORDER — PROPOFOL 500 MG/50ML IV EMUL
INTRAVENOUS | Status: AC
  Filled 2021-07-23: qty 50

## 2021-07-23 MED ORDER — OXYCODONE HCL 5 MG/5ML PO SOLN: 5.0000 mg | Freq: Once | ORAL | Status: DC | PRN

## 2021-07-23 SURGICAL SUPPLY — 55 items
APL SKNCLS STERI-STRIP NONHPOA (GAUZE/BANDAGES/DRESSINGS) ×1
BENZOIN TINCTURE PRP APPL 2/3 (GAUZE/BANDAGES/DRESSINGS) ×2 IMPLANT
BLADE EXTENDED COATED 6.5IN (ELECTRODE) IMPLANT
BLADE SURG 10 STRL SS (BLADE) IMPLANT
BLADE SURG 15 STRL LF DISP TIS (BLADE) ×1 IMPLANT
BLADE SURG 15 STRL SS (BLADE) ×2
COVER BACK TABLE 60X90IN (DRAPES) ×2 IMPLANT
COVER MAYO STAND STRL (DRAPES) ×2 IMPLANT
DECANTER SPIKE VIAL GLASS SM (MISCELLANEOUS) IMPLANT
DRAPE LAPAROTOMY 100X72 PEDS (DRAPES) ×2 IMPLANT
DRAPE UTILITY XL STRL (DRAPES) ×2 IMPLANT
DRSG PAD ABDOMINAL 8X10 ST (GAUZE/BANDAGES/DRESSINGS) ×2 IMPLANT
ELECT REM PT RETURN 9FT ADLT (ELECTROSURGICAL) ×2
ELECTRODE REM PT RTRN 9FT ADLT (ELECTROSURGICAL) ×1 IMPLANT
GAUZE 4X4 16PLY ~~LOC~~+RFID DBL (SPONGE) ×2 IMPLANT
GAUZE SPONGE 4X4 12PLY STRL (GAUZE/BANDAGES/DRESSINGS) ×2 IMPLANT
GAUZE SPONGE 4X4 12PLY STRL LF (GAUZE/BANDAGES/DRESSINGS) ×2 IMPLANT
GLOVE SURG ENC MOIS LTX SZ6.5 (GLOVE) ×2 IMPLANT
GLOVE SURG UNDER LTX SZ6.5 (GLOVE) ×2 IMPLANT
GOWN STRL REUS W/TWL XL LVL3 (GOWN DISPOSABLE) ×2 IMPLANT
HYDROGEN PEROXIDE 16OZ (MISCELLANEOUS) IMPLANT
IV CATH 14GX2 1/4 (CATHETERS) IMPLANT
IV CATH 18G SAFETY (IV SOLUTION) IMPLANT
KIT SIGMOIDOSCOPE (SET/KITS/TRAYS/PACK) IMPLANT
KIT TURNOVER CYSTO (KITS) ×2 IMPLANT
LOOP VESSEL MAXI BLUE (MISCELLANEOUS) IMPLANT
NDL SAFETY ECLIPSE 18X1.5 (NEEDLE) IMPLANT
NEEDLE HYPO 18GX1.5 SHARP (NEEDLE)
NEEDLE HYPO 22GX1.5 SAFETY (NEEDLE) ×4 IMPLANT
NS IRRIG 500ML POUR BTL (IV SOLUTION) ×2 IMPLANT
PACK BASIN DAY SURGERY FS (CUSTOM PROCEDURE TRAY) ×2 IMPLANT
PAD ARMBOARD 7.5X6 YLW CONV (MISCELLANEOUS) IMPLANT
PANTS MESH DISP LRG (UNDERPADS AND DIAPERS) ×1 IMPLANT
PANTS MESH DISPOSABLE L (UNDERPADS AND DIAPERS) ×1
PENCIL SMOKE EVACUATOR (MISCELLANEOUS) IMPLANT
SPONGE HEMORRHOID 8X3CM (HEMOSTASIS) IMPLANT
SPONGE SURGIFOAM ABS GEL 12-7 (HEMOSTASIS) IMPLANT
SUCTION FRAZIER HANDLE 10FR (MISCELLANEOUS)
SUCTION TUBE FRAZIER 10FR DISP (MISCELLANEOUS) IMPLANT
SUT CHROMIC 2 0 SH (SUTURE) IMPLANT
SUT CHROMIC 3 0 SH 27 (SUTURE) IMPLANT
SUT ETHIBOND 0 (SUTURE) IMPLANT
SUT VIC AB 2-0 SH 27 (SUTURE)
SUT VIC AB 2-0 SH 27XBRD (SUTURE) IMPLANT
SUT VIC AB 3-0 SH 18 (SUTURE) IMPLANT
SUT VIC AB 3-0 SH 27 (SUTURE)
SUT VIC AB 3-0 SH 27XBRD (SUTURE) IMPLANT
SYR CONTROL 10ML LL (SYRINGE) ×4 IMPLANT
TOWEL OR 17X26 10 PK STRL BLUE (TOWEL DISPOSABLE) ×2 IMPLANT
TRAP FLUID SMOKE EVACUATOR (MISCELLANEOUS) IMPLANT
TRAY DSU PREP LF (CUSTOM PROCEDURE TRAY) ×2 IMPLANT
TUBE CONNECTING 12X1/4 (SUCTIONS) IMPLANT
UNDERPAD 30X36 HEAVY ABSORB (UNDERPADS AND DIAPERS) ×2 IMPLANT
WATER STERILE IRR 500ML POUR (IV SOLUTION) ×2 IMPLANT
YANKAUER SUCT BULB TIP NO VENT (SUCTIONS) ×2 IMPLANT

## 2021-07-23 NOTE — Op Note (Signed)
07/23/2021  11:25 AM  PATIENT:  [name]  70 y.o. female  Patient Care Team: Lindell Spar, MD as PCP - General (Internal Medicine) Satira Sark, MD as PCP - Cardiology (Cardiology) Danie Binder, MD (Inactive) (Gastroenterology) Kassie Mends, RN as [facility]  PRE-OPERATIVE DIAGNOSIS:  CHRONIC ANAL FISSURE  POST-OPERATIVE DIAGNOSIS:  CHRONIC ANAL FISSURE  PROCEDURE:  CHEMICAL SPHINCTEROTOMY, FISSURECTOMY RECTAL EXAM UNDER ANESTHESIA    Surgeon(s): Leighton Ruff, MD  ASSISTANT: NONE   ANESTHESIA:   local and MAC  SPECIMEN:  No Specimen  DISPOSITION OF SPECIMEN:  N/A  COUNTS:  YES  PLAN OF CARE: Discharge to home after PACU  PATIENT DISPOSITION:  PACU - hemodynamically stable.  INDICATION: 70 y.o. F with anal pain after hemorrhoidectomy several years ago.  She was noted to have a chronic appearing anal fissure that does not completely resolve with topical medications   OR FINDINGS: anal fissure  DESCRIPTION: the patient was identified in the preoperative holding area and taken to the OR where they were laid on the operating room table.  MAC anesthesia was induced without difficulty. The patient was then positioned in prone jackknife position with buttocks gently taped apart.  The patient was then prepped and draped in usual sterile fashion.  SCDs were noted to be in place prior to the initiation of anesthesia. A surgical timeout was performed indicating the correct patient, procedure, positioning and need for preoperative antibiotics.  A rectal block was performed using Marcaine with epinephrine.    I began with a digital rectal exam.  I gently dilated to 2 finger breadths.  I then placed a Hill-Ferguson anoscope into the anal canal and evaluated this completely.  There were not masses or hemorrhoids noted.  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  I injected 100 units of Botox mixed with saline into the intersphincteric groove.   I resected the scar tissue from the fissure to allow for better healing.  Hemostasis was good. A dressing was applied.  The patient was awakened and sent to the PACU in stable condition.

## 2021-07-23 NOTE — Discharge Instructions (Addendum)
ANORECTAL SURGERY: POST OP INSTRUCTIONS Take your usually prescribed home medications unless otherwise directed. DIET: During the first few hours after surgery sip on some liquids until you are able to urinate.  It is normal to not urinate for several hours after this surgery.  If you feel uncomfortable, please contact the office for instructions.  After you are able to urinate,you may eat, if you feel like it.  Follow a light bland diet the first 24 hours after arrival home, such as soup, liquids, crackers, etc.  Be sure to include lots of fluids daily (6-8 glasses).  Avoid fast food or heavy meals, as your are more likely to get nauseated.  Eat a low fat diet the next few days after surgery.  Limit caffeine intake to 1-2 servings a day. PAIN CONTROL: Pain is best controlled by a usual combination of several different methods TOGETHER: Muscle relaxation: Soak in a warm bath (or Sitz bath) three times a day and after bowel movements.  Continue to do this until all pain is resolved. Over the counter pain medication Prescription pain medication Most patients will experience some swelling and discomfort in the anus/rectal area and incisions.  Heat such as warm towels, sitz baths, warm baths, etc to help relax tight/sore spots and speed recovery.  Some people prefer to use ice, especially in the first couple days after surgery, as it may decrease the pain and swelling, or alternate between ice & heat.  Experiment to what works for you.  Swelling and bruising can take several weeks to resolve.  Pain can take even longer to completely resolve. It is helpful to take an over-the-counter pain medication regularly for the first few weeks.  Choose one of the following that works best for you: Naproxen (Aleve, etc)  Two 215m tabs twice a day Ibuprofen (Advil, etc) Three 2015mtabs four times a day (every meal & bedtime) A  prescription for pain medication (such as percocet, oxycodone, hydrocodone, etc) should be  given to you upon discharge.  Take your pain medication as prescribed.  If you are having problems/concerns with the prescription medicine (does not control pain, nausea, vomiting, rash, itching, etc), please call [phone] see if we need to switch you to a different pain medicine that will work better for you and/or control your side effect better. If you need a refill on your pain medication, please contact your pharmacy.  They will contact our office to request authorization. Prescriptions will not be filled after 5 pm or on week-ends. KEEP YOUR BOWELS REGULAR and AVOID CONSTIPATION The goal is one to two soft bowel movements a day.  You should at least have a bowel movement every other day. Avoid getting constipated.  Between the surgery and the pain medications, it is common to experience some constipation. This can be very painful after rectal surgery.  Increasing fluid intake and taking a fiber supplement (such as Metamucil, Citrucel, FiberCon, etc) 1-2 times a day regularly will usually help prevent this problem from occurring.  A stool softener like colace is also recommended.  This can be purchased over the counter at your pharmacy.  You can take it up to 3 times a day.  If you do not have a bowel movement after 24 hrs since your surgery, take one does of milk of magnesia.  If you still haven't had a bowel movement 8-12 hours after that dose, take another dose.  If you don't have a bowel movement 48 hrs after surgery,  purchase a Fleets enema from the drug store and administer gently per package instructions.  If you still are having trouble with your bowel movements after that, please call the office for further instructions. If you develop diarrhea or have many loose bowel movements, simplify your diet to bland foods & liquids for a few days.  Stop any stool softeners and decrease your fiber supplement.  Switching to mild anti-diarrheal medications (Kayopectate, Pepto Bismol) can help.   If this worsens or does not improve, please call us.  Wound Care Remove your bandages before your first bowel movement or 8 hours after surgery.     Remove any wound packing material at this tim,e as well.  You do not need to repack the wound unless instructed otherwise.  Wear an absorbent pad or soft cotton gauze in your underwear to catch any drainage and help keep the area clean. You should change this every 2-3 hours while awake. Keep the area clean and dry.  Bathe / shower every day, especially after bowel movements.  Keep the area clean by showering / bathing over the incision / wound.   It is okay to soak an open wound to help wash it.  Wet wipes or showers / gentle washing after bowel movements is often less traumatic than regular toilet paper. You may have some styrofoam-like soft packing in the rectum which will come out with the first bowel movement.  You will often notice bleeding with bowel movements.  This should slow down by the end of the first week of surgery Expect some drainage.  This should slow down, too, by the end of the first week of surgery.  Wear an absorbent pad or soft cotton gauze in your underwear until the drainage stops. Do Not sit on a rubber or pillow ring.  This can make you symptoms worse.  You may sit on a soft pillow if needed.  ACTIVITIES as tolerated:   You may resume regular (light) daily activities beginning the next day--such as daily self-care, walking, climbing stairs--gradually increasing activities as tolerated.  If you can walk 30 minutes without difficulty, it is safe to try more intense activity such as jogging, treadmill, bicycling, low-impact aerobics, swimming, etc. Save the most intensive and strenuous activity for last such as sit-ups, heavy lifting, contact sports, etc  Refrain from any heavy lifting or straining until you are off narcotics for pain control.   You may drive when you are no longer taking prescription pain medication, you can  comfortably sit for long periods of time, and you can safely maneuver your car and apply brakes. You may have sexual intercourse when it is comfortable.  FOLLOW UP in our office Please call [facility] at [phone] to set up an appointment to see your surgeon in the office for a follow-up appointment approximately 3-4 weeks after your surgery. Make sure that you call for this appointment the day you arrive home to insure a convenient appointment time. 10. IF YOU HAVE DISABILITY OR FAMILY LEAVE FORMS, BRING THEM TO THE OFFICE FOR PROCESSING.  DO NOT GIVE THEM TO YOUR DOCTOR.     WHEN TO CALL us [phone]: Poor pain control Reactions / problems with new medications (rash/itching, nausea, etc)  Fever over 101.5 F (38.5 C) Inability to urinate Nausea and/or vomiting Worsening swelling or bruising Continued bleeding from incision. Increased pain, redness, or drainage from the incision  The clinic staff is available to answer your questions during regular business hours (8:30am-5pm).  Please don't hesitate to call and ask to speak to one of our nurses for clinical concerns.   A surgeon from [facility] is always on call at the hospitals   If you have a medical emergency, go to the nearest emergency room or call 911.    [facility], [place], [place], [place], [place]  [address] ? MAIN: [phone] ? TOLL FREE: [phone] ? FAX [phone] [email or web address]   Post Anesthesia Home Care Instructions  Activity: Get plenty of rest for the remainder of the day. A responsible individual must stay with you for 24 hours following the procedure.  For the next 24 hours, DO NOT: -Drive a car -Paediatric nurse -Drink alcoholic beverages -Take any medication unless instructed by your physician -Make any legal decisions or sign important papers.  Meals: Start with liquid foods such as gelatin or soup. Progress to regular foods  as tolerated. Avoid greasy, spicy, heavy foods. If nausea and/or vomiting occur, drink only clear liquids until the nausea and/or vomiting subsides. Call your physician if vomiting continues.  Special Instructions/Symptoms: Your throat may feel dry or sore from the anesthesia or the breathing tube placed in your throat during surgery. If this causes discomfort, gargle with warm salt water. The discomfort should disappear within 24 hours.

## 2021-07-23 NOTE — Transfer of Care (Signed)
Immediate Anesthesia Transfer of Care Note  Patient: [name]  Procedure(s) Performed: CHEMICAL SPHINCTEROTOMY (Rectum) RECTAL EXAM UNDER ANESTHESIA (Rectum)  Patient Location: PACU  Anesthesia Type:MAC  Level of Consciousness: awake, alert , oriented and patient cooperative  Airway & Oxygen Therapy: Patient Spontanous Breathing  Post-op Assessment: Report given to RN and Post -op Vital signs reviewed and stable  Post vital signs: Reviewed and stable  Last Vitals:  Vitals Value Taken Time  BP    Temp    Pulse    Resp    SpO2      Last Pain:  Vitals:   07/23/21 0956  TempSrc: Oral  PainSc: 8       Patients Stated Pain Goal: 5 (45/03/88 8280)  Complications: No notable events documented.

## 2021-07-23 NOTE — Progress Notes (Addendum)
C/O chest pain "achy and heavy "with pain scale 4. Radiates to left neck and shoulder with ache in left arm. States has not had this feeling before. Dr. Marcello Moores at bedside and aware. Dr. Royce Macadamia notified. No changes to bedside EKG monitor.

## 2021-07-23 NOTE — Anesthesia Preprocedure Evaluation (Addendum)
Anesthesia Evaluation  Patient identified by MRN, date of birth, ID band Patient awake    Reviewed: Allergy & Precautions, NPO status , Patient's Chart, lab work & pertinent test results  History of Anesthesia Complications (+) PONV and history of anesthetic complications  Airway Mallampati: III  TM Distance: >3 FB Neck ROM: Full    Dental no notable dental hx. (+) Teeth Intact, Dental Advisory Given   Pulmonary with exertion, asthma , pneumonia, resolved, Recent URI , former smoker, PE Hx/o PTE in 2016   Pulmonary exam normal breath sounds clear to auscultation       Cardiovascular hypertension, Pt. on medications Normal cardiovascular exam Rhythm:Regular Rate:Normal  EKG 01/30/21 NSR, q's in II,III,aVF. 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 Left ventricular ejection fraction, by visual estimation, is 65 to 70%. The left ventricle has normal function. There is mildly increased left ventricular hypertrophy.  2. Left ventricular diastolic parameters are consistent with Grade I diastolic dysfunction (impaired relaxation).  3. The left ventricle has no regional wall motion abnormalities.  4. Global right ventricle has normal systolic function.The right ventricular size is normal. No increase in right ventricular wall thickness.  5. Left atrial size was normal.  6. Right atrial size was normal.  7. The mitral valve is grossly normal. No evidence of mitral valve regurgitation.  8. The tricuspid valve is grossly normal. Tricuspid valve regurgitation is not demonstrated.  9. The aortic valve is tricuspid. Aortic valve regurgitation is not visualized. No evidence of aortic valve sclerosis or stenosis.  10. The pulmonic valve was grossly normal. Pulmonic valve regurgitation is not visualized.  11. The inferior vena cava is normal in size with greater than 50% respiratory variability, suggesting right atrial pressure of 3  mmHg.    Neuro/Psych  Headaches, PSYCHIATRIC DISORDERS Anxiety Depression Chronic open angle glaucoma Blind OS, decreased vision OD  Neuromuscular disease    GI/Hepatic GERD  Medicated and Controlled,(+) Hepatitis -, UnspecifiedNASH- mildly elevated LFT's IBS Chronic anal fissure   Endo/Other  Hypothyroidism   Renal/GU Renal InsufficiencyRenal disease  negative genitourinary   Musculoskeletal  (+) Arthritis , Osteoarthritis,    Abdominal (+) + obese,   Peds  Hematology  (+) Sickle cell trait , G6PD deficiency   Anesthesia Other Findings   Reproductive/Obstetrics                            Anesthesia Physical Anesthesia Plan  ASA: 3  Anesthesia Plan: MAC   Post-op Pain Management:    Induction: Intravenous  PONV Risk Score and Plan: 4 or greater and Treatment may vary due to age or medical condition, Ondansetron and Propofol infusion  Airway Management Planned: Natural Airway and Nasal Cannula  Additional Equipment:   Intra-op Plan:   Post-operative Plan:   Informed Consent: I have reviewed the patients History and Physical, chart, labs and discussed the procedure including the risks, benefits and alternatives for the proposed anesthesia with the patient or authorized representative who has indicated his/her understanding and acceptance.     Dental advisory given  Plan Discussed with: CRNA and Anesthesiologist  Anesthesia Plan Comments:         Anesthesia Quick Evaluation

## 2021-07-23 NOTE — Anesthesia Postprocedure Evaluation (Signed)
Anesthesia Post Note  Patient: [name]  Procedure(s) Performed: CHEMICAL SPHINCTEROTOMY (Rectum) RECTAL EXAM UNDER ANESTHESIA (Rectum)     Patient location during evaluation: PACU Anesthesia Type: MAC Level of consciousness: awake and alert and oriented Pain management: pain level controlled Vital Signs Assessment: post-procedure vital signs reviewed and stable Respiratory status: spontaneous breathing, nonlabored ventilation and respiratory function stable Cardiovascular status: stable and blood pressure returned to baseline Postop Assessment: no apparent nausea or vomiting Anesthetic complications: no   No notable events documented.  Last Vitals:  Vitals:   07/23/21 1200 07/23/21 1215  BP: 116/78 125/64  Pulse: 82 84  Resp: 16 16  Temp:    SpO2: 95% 96%    Last Pain:  Vitals:   07/23/21 1200  TempSrc:   PainSc: 2                  [name] A.

## 2021-07-23 NOTE — ED Provider Notes (Signed)
[place]   [id number] 07/22/21 Arrival Time: 3354  ASSESSMENT & PLAN:  1. Right hip pain    Two w ago. Pain not worsening. Declines imaging today. Low susp for fx. Likely more inflammatory/strain.  Begin trial of: Meds ordered this encounter  Medications   predniSONE (DELTASONE) 20 MG tablet    Sig: Take 2 tablets (40 mg total) by mouth daily.    Dispense:  10 tablet    Refill:  0   Activities as tolerated.  Recommend:  Follow-up Information     Lindell Spar, MD.   Specialty: Internal Medicine Why: If worsening or failing to improve as anticipated. Contact information: [place] [address] [phone]                  Reviewed expectations re: course of current medical issues. Questions answered. Outlined signs and symptoms indicating need for more acute intervention. Patient verbalized understanding. After Visit Summary given.  SUBJECTIVE: History from: patient. [name] is a 70 y.o. female who reports slipping on ground and falling into bags of manure; R hip sore after. Wt bearing with some discomfort. No extremity sensation changes or weakness. "Just still sore around my hip". Worse with certain movements and prolonged wt bearing. Has taken Norco and muscle relaxer with temp relief.   Past Surgical History:  Procedure Laterality Date   ABDOMINAL HYSTERECTOMY     ADENOIDECTOMY     ANGIOPLASTY     APPENDECTOMY     BIOPSY N/A 03/14/2013   Procedure: SMALL BOWEL AND GASTRIC BIOPSIES (Procedure #1);  Surgeon: Danie Binder, MD;  Location: [facility];  Service: Endoscopy;  Laterality: N/A;   CARDIAC CATHETERIZATION Left 02/11/2000   CATARACT EXTRACTION Left    CATARACT EXTRACTION W/PHACO Right 03/19/2021   Procedure: CATARACT EXTRACTION PHACO AND INTRAOCULAR LENS PLACEMENT ([facility]) RIGHT 6.75 00:50.4;  Surgeon: Leandrew Koyanagi, MD;  Location: [facility];  Service: Ophthalmology;  Laterality: Right;    CHOLECYSTECTOMY  12/2007   COLONOSCOPY  12/22/2010   GOT:LXBWIO, cecal adenomatous polyp   COLONOSCOPY WITH PROPOFOL N/A 03/31/2016   Dr. Oneida Alar: four sessile polyps rectum/sigmoid colon, diverticulosi, ext/int hemorrhoids. hyperplastic polyps, next tcs 5 years.   COLONOSCOPY WITH PROPOFOL N/A 04/21/2021   Procedure: COLONOSCOPY WITH PROPOFOL;  Surgeon: Eloise Harman, DO;  [facility];  Service: Endoscopy;  Laterality: N/A;  9:30am   complete hysterectomy     ENTEROSCOPY N/A 03/14/2013   MBT:DHRC gastritis/ulcers has healed   ESOPHAGOGASTRODUODENOSCOPY  09/22/2011   BUL:AGTX gastritis/Duodenitis   EXCISIONAL HEMORRHOIDECTOMY     FLEXIBLE SIGMOIDOSCOPY N/A 03/14/2013   SLF:3 colon polyp removed/moderate sized internal hemorrhoids   FLEXIBLE SIGMOIDOSCOPY N/A 06/09/2016   Procedure: FLEXIBLE SIGMOIDOSCOPY;  Surgeon: Danie Binder, MD;  [facility];  Service: Endoscopy;  Laterality: N/A;  rectal polyps times 2   FLEXIBLE SIGMOIDOSCOPY N/A 03/18/2018   Procedure: FLEXIBLE SIGMOIDOSCOPY;  Surgeon: Danie Binder, MD;  [facility];  Service: Endoscopy;  Laterality: N/A;  9:15am   FOOT SURGERY     bunion removal left   GIVENS CAPSULE STUDY N/A 02/10/2013   Procedure: GIVENS CAPSULE STUDY;  Surgeon: Danie Binder, MD;  [facility];  Service: Endoscopy;  Laterality: N/A;  [place]     right    HEMORRHOID BANDING N/A 03/14/2013   Procedure: HEMORRHOID BANDING (Procedure #3)  3 bands applied [id number] Exp 02/02/2014 ;  Surgeon:  Danie Binder, MD;  Location: [facility];  Service: Endoscopy;  Laterality: N/A;   HEMORRHOID SURGERY N/A 04/24/2016   Procedure: EXTENSIVE HEMORRHOIDECTOMY;  Surgeon: Vickie Epley, MD;  Location: [facility];  Service: General;  Laterality: N/A;   LAPAROSCOPY     adhesions   POLYPECTOMY N/A 03/14/2013   [facility]U:XNAT Gastritis . ULCERS SEEN ON MAY 6 HAVE HEALED   POLYPECTOMY  03/31/2016   Procedure:  POLYPECTOMY;  Surgeon: Danie Binder, MD;  [facility];  Service: Endoscopy;;  sigmoid colon polyps x2, rectal polyps x2   POLYPECTOMY  04/21/2021   Procedure: POLYPECTOMY;  Surgeon: Eloise Harman, DO;  [facility];  Service: Endoscopy;;   TONSILLECTOMY     TUBAL LIGATION        OBJECTIVE:  Vitals:   07/22/21 1445  BP: 111/71  Pulse: 90  Resp: 16  Temp: 98.6 F (37 C)  TempSrc: Oral  SpO2: 95%    General appearance: alert; no distress HEENT: [place]; AT Neck: supple with FROM Resp: unlabored respirations Extremities: RLE: warm with well perfused appearance; poorly localized moderate tenderness over right lateral hip; without gross deformities; swelling: none; bruising: none; hip ROM: normal Skin: warm and dry; no visible rashes Neurologic: gait normal; normal sensation and strength of bilateral LE Psychological: alert and cooperative; normal mood and affect   Allergies  Allergen Reactions   Nutmeg Oil (Myristica Oil) Anaphylaxis    Nut oil- skin irritation. Nut oil- skin irritation.   Adhesive [Tape] Other (See Comments)    Blisters skin   Aspirin Other (See Comments)    sickle cell trait--  Not recommended unless emergency   Imitrex [Sumatriptan] Hives   Keflex [Cephalexin] Other (See Comments)    G6PD   Levaquin [Levofloxacin In D5w]     Altered mental status Affects G6PD   Other     Nut Oil- skin irritation    Statins Other (See Comments)    Myopathy - elevated CK Myopathy - elevated CK Myopathy-elevated CK   Sulfa Antibiotics Other (See Comments)    Patient has sickle cell trait Sickle cell trait.   Sulfonamide Derivatives Other (See Comments)    Patient has sickle cell trait   Latex Rash    Past Medical History:  Diagnosis Date   Anxiety    Arthritis    Asthma    Cataract    Phreesia 01/05/2021   Chest pain    a. 02/2000 Cath: nl cors, EF 60%;  b. 10/2010 MV: nl LV, no ischemia/infarct;  c. 03/2014 Admit c/p, r/o->grief  from husbands death.   Chronic RUQ pain 2007/12/26   EUS slightly dilated CBD (7.9m), otherwise nl   Depression    Eczema    Exposure to chemical inhalation mid 1970s   Fatty liver    G6PD deficiency    GERD (gastroesophageal reflux disease)    Glaucoma    History of pulmonary embolus (PE)    Treated with Eliquis 22016/03/22  HTN (hypertension)    Hypercholesteremia    a. intolerant to statins.   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 She has limited vision in the right eye.   Ocular migraine    Palpitations    Pneumonia 07/16/2021   Patient was exposed to a harsh chemical in the 1970's that created scar tissue in her lungs. She has  had pneumonia several times in the past.   PONV (postoperative nausea and vomiting)    Recurrent upper respiratory infection (URI)    Renal cyst    Sickle cell trait (HCC)    Tubular adenoma of colon 09/17/2011   Urticaria    Social History   Socioeconomic History   Marital status: Widowed    Spouse name: Not on file   Number of children: Not on file   Years of education: Not on file   Highest education level: Not on file  Occupational History   Occupation: homemaker    Employer: UNEMPLOYED  Tobacco Use   Smoking status: Former    Packs/day: 0.50    Years: 30.00    Pack years: 15.00    Types: Cigarettes   Smokeless tobacco: Former    Quit date: 10/15/1996   Tobacco comments:    Stopped smoking ~ 2000  Vaping Use   Vaping Use: Never used  Substance and Sexual Activity   Alcohol use: Yes    Alcohol/week: 4.0 standard drinks    Types: 4 Glasses of wine per week   Drug use: No   Sexual activity: Yes    Birth control/protection: Surgical  Other Topics Concern   Not on file  Social History Narrative   Does not routinely exercise. Husband passed in JUN 2015 due to prostate ca.   Social Determinants of Health   Financial Resource Strain: Low Risk     Difficulty of Paying Living Expenses: Not hard at all  Food Insecurity: No Food Insecurity   Worried About Charity fundraiser in the Last Year: Never true   [place] in the Last Year: Never true  Transportation Needs: No Transportation Needs   Lack of Transportation (Medical): No   Lack of Transportation (Non-Medical): No  Physical Activity: Sufficiently Active   Days of Exercise per Week: 7 days   Minutes of Exercise per Session: 60 min  Stress: No Stress Concern Present   Feeling of Stress : Not at all  Social Connections: Socially Isolated   Frequency of Communication with Friends and Family: More than three times a week   Frequency of Social Gatherings with Friends and Family: Never   Attends Religious Services: Never   Marine scientist or Organizations: No   Attends Archivist Meetings: Never   Marital Status: Widowed   Family History  Problem Relation Age of Onset   Colon cancer Mother        73s   Urticaria Mother    Hypertension Mother    Cancer Mother    Cancer Father        oral   Crohn's disease Sister    Colon cancer Maternal Grandfather    Colon cancer Paternal Grandfather    Diabetes Brother    Colon cancer Maternal Grandmother    Anesthesia problems Neg Hx    Past Surgical History:  Procedure Laterality Date   ABDOMINAL HYSTERECTOMY     ADENOIDECTOMY     ANGIOPLASTY     APPENDECTOMY     BIOPSY N/A 03/14/2013   Procedure: SMALL BOWEL AND GASTRIC BIOPSIES (Procedure #1);  Surgeon: Danie Binder, MD;  [facility]: [facility];  Service: Endoscopy;  Laterality: N/A;   CARDIAC CATHETERIZATION Left 02/11/2000   CATARACT EXTRACTION Left    CATARACT EXTRACTION W/PHACO Right 03/19/2021   Procedure: CATARACT EXTRACTION PHACO AND INTRAOCULAR LENS PLACEMENT ([facility]) RIGHT 6.75 00:50.4;  Surgeon: Leandrew Koyanagi, MD;  [facility];  Service: Ophthalmology;  Laterality: Right;   CHOLECYSTECTOMY  12/2007   COLONOSCOPY  12/22/2010    JGG:EZMOQH, cecal adenomatous polyp   COLONOSCOPY WITH PROPOFOL N/A 03/31/2016   Dr. Oneida Alar: four sessile polyps rectum/sigmoid colon, diverticulosi, ext/int hemorrhoids. hyperplastic polyps, next tcs 5 years.   COLONOSCOPY WITH PROPOFOL N/A 04/21/2021   Procedure: COLONOSCOPY WITH PROPOFOL;  Surgeon: Eloise Harman, DO;  [facility];  Service: Endoscopy;  Laterality: N/A;  9:30am   complete hysterectomy     ENTEROSCOPY N/A 03/14/2013   UTM:LYYT gastritis/ulcers has healed   ESOPHAGOGASTRODUODENOSCOPY  09/22/2011   KPT:WSFK gastritis/Duodenitis   EXCISIONAL HEMORRHOIDECTOMY     FLEXIBLE SIGMOIDOSCOPY N/A 03/14/2013   SLF:3 colon polyp removed/moderate sized internal hemorrhoids   FLEXIBLE SIGMOIDOSCOPY N/A 06/09/2016   Procedure: FLEXIBLE SIGMOIDOSCOPY;  Surgeon: Danie Binder, MD;  [facility];  Service: Endoscopy;  Laterality: N/A;  rectal polyps times 2   FLEXIBLE SIGMOIDOSCOPY N/A 03/18/2018   Procedure: FLEXIBLE SIGMOIDOSCOPY;  Surgeon: Danie Binder, MD;  [facility];  Service: Endoscopy;  Laterality: N/A;  9:15am   FOOT SURGERY     bunion removal left   GIVENS CAPSULE STUDY N/A 02/10/2013   Procedure: GIVENS CAPSULE STUDY;  Surgeon: Danie Binder, MD;  [facility];  Service: Endoscopy;  Laterality: N/A;  [place]     right    HEMORRHOID BANDING N/A 03/14/2013   Procedure: HEMORRHOID BANDING (Procedure #3)  3 bands applied [id number] Exp 02/02/2014 ;  Surgeon: Danie Binder, MD;  [facility]: [facility];  Service: Endoscopy;  Laterality: N/A;   HEMORRHOID SURGERY N/A 04/24/2016   Procedure: EXTENSIVE HEMORRHOIDECTOMY;  Surgeon: Vickie Epley, MD;  Location: [facility];  Service: General;  Laterality: N/A;   LAPAROSCOPY     adhesions   POLYPECTOMY N/A 03/14/2013   CBS:WHQP Gastritis . ULCERS SEEN ON MAY 6 HAVE HEALED   POLYPECTOMY  03/31/2016   Procedure: POLYPECTOMY;  Surgeon: Danie Binder, MD;  [facility];  Service: Endoscopy;;  sigmoid colon polyps x2, rectal polyps x2   POLYPECTOMY  04/21/2021   Procedure: POLYPECTOMY;  Surgeon: Eloise Harman, DO;  [facility];  Service: Endoscopy;;   TONSILLECTOMY     TUBAL LIGATION         Vanessa Kick, MD 07/23/21 [phone]

## 2021-07-23 NOTE — Interval H&P Note (Signed)
History and Physical Interval Note:  07/23/2021 10:32 AM  [name]  has presented today for surgery, with the diagnosis of CHRONIC ANAL FISSURE.  The various methods of treatment have been discussed with the patient and family. After consideration of risks, benefits and other options for treatment, the patient has consented to  Procedure(s): CHEMICAL SPHINCTEROTOMY (N/A) RECTAL EXAM UNDER ANESTHESIA (N/A) as a surgical intervention.  The patient's history has been reviewed, patient examined, no change in status, stable for surgery.  I have reviewed the patient's chart and labs.  Questions were answered to the patient's satisfaction.     Rosario Adie, MD  [facility]

## 2021-07-24 ENCOUNTER — Encounter (HOSPITAL_BASED_OUTPATIENT_CLINIC_OR_DEPARTMENT_OTHER): Payer: Self-pay | Admitting: General Surgery

## 2021-08-13 ENCOUNTER — Ambulatory Visit (INDEPENDENT_AMBULATORY_CARE_PROVIDER_SITE_OTHER): Payer: Medicare Other | Admitting: Gastroenterology

## 2021-08-13 ENCOUNTER — Encounter: Payer: Self-pay | Admitting: Gastroenterology

## 2021-08-13 ENCOUNTER — Other Ambulatory Visit: Payer: Self-pay

## 2021-08-13 VITALS — BP 122/71 | HR 85 | Temp 97.3°F | Ht 67.0 in | Wt 207.4 lb

## 2021-08-13 DIAGNOSIS — R197 Diarrhea, unspecified: Secondary | ICD-10-CM

## 2021-08-13 DIAGNOSIS — K219 Gastro-esophageal reflux disease without esophagitis: Secondary | ICD-10-CM

## 2021-08-13 NOTE — Progress Notes (Signed)
Referring Provider: Lindell Spar, MD Primary Care Physician:  Lindell Spar, MD Primary GI: Dr. Abbey Chatters   Chief Complaint  Patient presents with   Diarrhea    Pretty intense the past 5 days    HPI:   [name] is a 70 y.o. female presenting today with a history of IBS, GERD, NASH, chronic anal fissure and underwent recent repair. Colonoscopy July 2022: anal fissure, non-bleeding internal hemorrhoids, right colon diverticulosis, one 2 mm polyp in ascending, three 4-6 mm polyps in descending colon. Tubular adenomas. 5 year surveillance.   Has had diarrhea off and on for months and worst the past 5 days. BRAT diet currently. No recent antibiotics. No changes in medications. Reverse osmosis water. Prior to diarrhea, baseline habits were Bristol stool scale #2-3. Diarrhea has calmed down slightly with hyoscyamine. Bentyl has not helped in the past.   Dexilant for reflux. Controlled.        Past Medical History:  Diagnosis Date   Anxiety    Arthritis    Asthma    Cataract    Phreesia 01/05/2021   Chest pain    a. 02/2000 Cath: nl cors, EF 60%;  b. 10/2010 MV: nl LV, no ischemia/infarct;  c. 03/2014 Admit c/p, r/o->grief from husbands death.   Chronic RUQ pain 11-17-2007   EUS slightly dilated CBD (7.32m), otherwise nl   Depression    Eczema    Exposure to chemical inhalation mid 1970s   Fatty liver    G6PD deficiency    GERD (gastroesophageal reflux disease)    Glaucoma    History of pulmonary embolus (PE)    Treated with Eliquis 22016/02/12  HTN (hypertension)    Hypercholesteremia    a. intolerant to statins.   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 She has limited vision in the right eye.   Ocular migraine    Palpitations    Pneumonia 07/16/2021   Patient was exposed to a harsh chemical in the 1970's that created scar tissue in her lungs. She has had pneumonia several times  in the past.   PONV (postoperative nausea and vomiting)    Recurrent upper respiratory infection (URI)    Renal cyst    Sickle cell trait (HCC)    Tubular adenoma of colon 09/17/2011   Urticaria     Past Surgical History:  Procedure Laterality Date   ABDOMINAL HYSTERECTOMY     ADENOIDECTOMY     ANGIOPLASTY     APPENDECTOMY     BIOPSY N/A 03/14/2013   Procedure: SMALL BOWEL AND GASTRIC BIOPSIES (Procedure #1);  Surgeon: SDanie Binder MD;  Location: [facility];  Service: Endoscopy;  Laterality: N/A;   CARDIAC CATHETERIZATION Left 02/11/2000   CATARACT EXTRACTION Left    CATARACT EXTRACTION W/PHACO Right 03/19/2021   Procedure: CATARACT EXTRACTION PHACO AND INTRAOCULAR LENS PLACEMENT ([place] RIGHT 6.75 00:50.4;  Surgeon: BLeandrew Koyanagi MD;  [facility]  Service: Ophthalmology;  Laterality: Right;   CHOLECYSTECTOMY  12/2007   COLONOSCOPY  12/22/2010   RVWP:VXYIAX cecal adenomatous polyp   COLONOSCOPY WITH PROPOFOL N/A 03/31/2016   Dr. FOneida Alar four sessile polyps rectum/sigmoid colon, diverticulosi, ext/int hemorrhoids. hyperplastic polyps, next tcs 5 years.   COLONOSCOPY WITH PROPOFOL N/A 04/21/2021   Procedure: COLONOSCOPY WITH PROPOFOL;  Surgeon: CEloise Harman DO;  [facility];  Service: Endoscopy;  Laterality: N/A;  9:30am   complete hysterectomy     ENTEROSCOPY N/A 03/14/2013   QMG:NOIB gastritis/ulcers has healed   ESOPHAGOGASTRODUODENOSCOPY  09/22/2011   BCW:UGQB gastritis/Duodenitis   EXCISIONAL HEMORRHOIDECTOMY     FLEXIBLE SIGMOIDOSCOPY N/A 03/14/2013   SLF:3 colon polyp removed/moderate sized internal hemorrhoids   FLEXIBLE SIGMOIDOSCOPY N/A 06/09/2016   Procedure: FLEXIBLE SIGMOIDOSCOPY;  Surgeon: Danie Binder, MD;  [facility];  Service: Endoscopy;  Laterality: N/A;  rectal polyps times 2   FLEXIBLE SIGMOIDOSCOPY N/A 03/18/2018   Procedure: FLEXIBLE SIGMOIDOSCOPY;  Surgeon: Danie Binder, MD;  [facility];  Service: Endoscopy;  Laterality: N/A;  9:15am   FOOT SURGERY     bunion removal left   GIVENS CAPSULE STUDY N/A 02/10/2013   Procedure: GIVENS CAPSULE STUDY;  Surgeon: Danie Binder, MD;  [facility];  Service: Endoscopy;  Laterality: N/A;  [place]     right    HEMORRHOID BANDING N/A 03/14/2013   Procedure: HEMORRHOID BANDING (Procedure #3)  3 bands applied [id number] Exp 02/02/2014 ;  Surgeon: Danie Binder, MD;  Location: [facility];  Service: Endoscopy;  Laterality: N/A;   HEMORRHOID SURGERY N/A 04/24/2016   Procedure: EXTENSIVE HEMORRHOIDECTOMY;  Surgeon: Vickie Epley, MD;  Location: [facility];  Service: General;  Laterality: N/A;   LAPAROSCOPY     adhesions   POLYPECTOMY N/A 03/14/2013   CMK:LKJZ Gastritis . ULCERS SEEN ON MAY 6 HAVE HEALED   POLYPECTOMY  03/31/2016   Procedure: POLYPECTOMY;  Surgeon: Danie Binder, MD;  [facility];  Service: Endoscopy;;  sigmoid colon polyps x2, rectal polyps x2   POLYPECTOMY  04/21/2021   Procedure: POLYPECTOMY;  Surgeon: Eloise Harman, DO;  [facility];  Service: Endoscopy;;   RECTAL EXAM UNDER ANESTHESIA N/A 07/23/2021   Procedure: RECTAL EXAM UNDER ANESTHESIA;  Surgeon: Leighton Ruff, MD;  Location: [place];  Service: General;  Laterality: N/A;   SPHINCTEROTOMY N/A 07/23/2021   Procedure: CHEMICAL SPHINCTEROTOMY;  Surgeon: Leighton Ruff, MD;  Location: [facility];  Service: General;  Laterality: N/A;   TONSILLECTOMY     TUBAL LIGATION      Current Outpatient Medications  Medication Sig Dispense Refill   albuterol (PROVENTIL) (2.5 MG/3ML) 0.083% nebulizer solution Take 3 mLs (2.5 mg total) by nebulization every 4 (four) hours as needed for wheezing or shortness of breath. 150 mL 3   albuterol (VENTOLIN HFA) 108 (90 Base) MCG/ACT inhaler Inhale 2 puffs into the lungs every 4 (four) hours as needed for wheezing. 54 g 3   ALPRAZolam (XANAX) 1  MG tablet Take 1 tablet (1 mg total) by mouth 2 (two) times daily as needed for anxiety. (Patient taking differently: Take 0.5-1 mg by mouth at bedtime.) 180 tablet 3   brimonidine (ALPHAGAN) 0.2 % ophthalmic solution Place 1 drop into both eyes 2 (two) times daily. 15 mL 3   budesonide-formoterol (SYMBICORT) 160-4.5 MCG/ACT inhaler Inhale 2 puffs into the lungs as needed. (Patient taking differently: Inhale 2 puffs into the lungs daily as needed (Asthma).) 10.2 g 3   chlorpheniramine-HYDROcodone (TUSSIONEX PENNKINETIC ER) 10-8 MG/5ML SUER Take 5 mLs by mouth every 12 (twelve) hours as needed. (Patient taking differently: Take 5 mLs by mouth every 12 (twelve) hours as needed for cough.) 140 mL 0   Cholecalciferol (VITAMIN D3) 50 MCG (2000 UT) TABS Take 2,000 Units by mouth daily.  cyclobenzaprine (FLEXERIL) 10 MG tablet TAKE ONE TABLET BY MOUTH DAILY  as needed muscle spasms (Patient taking differently: Take 10 mg by mouth daily as needed (Arthritis and torn rotator cuff left).) 90 tablet 3   dexlansoprazole (DEXILANT) 60 MG capsule Take 1 capsule (60 mg total) by mouth daily. 90 capsule 3   dicyclomine (BENTYL) 10 MG capsule Take one capsule before meals and at bedtime AS NEEDED for abdominal pain and frequent stool. HOLD for constipation (Patient taking differently: Take 10 mg by mouth daily as needed (IBS).) 120 capsule 1   DUREZOL 0.05 % EMUL Place 1 drop into the right eye daily.     estradiol (ESTRACE VAGINAL) 0.1 MG/GM vaginal cream Place 1 Applicatorful vaginally daily as needed. (Patient taking differently: Place 1 Applicatorful vaginally daily as needed (Dryness).) 42.5 g 3   fenofibrate (TRICOR) 145 MG tablet Take 1 tablet (145 mg total) by mouth daily. (Patient taking differently: Take 145 mg by mouth at bedtime.) 90 tablet 3   hyoscyamine (ANASPAZ) 0.125 MG TBDP disintergrating tablet Place 0.125 mg under the tongue daily as needed (IBS).     irbesartan (AVAPRO) 150 MG tablet Take 0.5  tablets (75 mg total) by mouth daily. 45 tablet 3   latanoprost (XALATAN) 0.005 % ophthalmic solution Place 1 drop into both eyes at bedtime. 9 mL 3   levothyroxine (SYNTHROID) 25 MCG tablet Take 1 tablet (25 mcg total) by mouth daily before breakfast. 95 tablet 1   Lidocaine-Hydrocortisone Ace 3-2.5 % KIT APPLY TO RECTUM QID for 2 weeks then AS NEEDED FOR RECTAL PAIN OR BLEEDING 1 kit 11   Lifitegrast (XIIDRA) 5 % SOLN INSTILL 1 DROP IN BOTH EYES EVERY DAY AS NEEDED (EACH CONTAINER SHOULD YIELD 2 DROPS) STORE IN ORIGINAL FOIL POUCH (Patient taking differently: Place 1 drop into both eyes daily. (EACH CONTAINER SHOULD YIELD 2 DROPS) STORE IN ORIGINAL FOIL POUCH) 60 each 3   magnesium oxide (HM MAGNESIUM) 400 MG tablet Take 2 tablets (800 mg total) by mouth daily. (Patient taking differently: Take 400 mg by mouth daily.)     meclizine (ANTIVERT) 12.5 MG tablet Take 1 tablet (12.5 mg total) by mouth 3 (three) times daily as needed for dizziness. (Patient taking differently: Take 12.5 mg by mouth 3 (three) times daily as needed (vertigo).) 30 tablet 3   Menthol, Topical Analgesic, (BIOFREEZE EX) Apply 1 application topically daily as needed (pain).     metroNIDAZOLE (METROCREAM) 0.75 % cream Apply 1 application topically as needed. (Patient taking differently: Apply 1 application topically daily as needed (Rash on face).) 45 g 3   naproxen (NAPROSYN) 500 MG tablet Take 500 mg by mouth daily as needed for moderate pain or mild pain.     neomycin-polymyxin b-dexamethasone (MAXITROL) 3.5-10000-0.1 OINT Place 1 application into both eyes at bedtime as needed. (Patient taking differently: Place 1 application into both eyes at bedtime as needed (Dry eyes).) 10.5 g 3   nitroGLYCERIN (NITROLINGUAL) 0.4 MG/SPRAY spray Place 1 spray under the tongue every 5 (five) minutes as needed for chest pain. 12 g 3   NON FORMULARY Take 1 tablet by mouth daily as needed (Stomach Bloat). FD GUARD     ondansetron (ZOFRAN) 4 MG  tablet Take 1 tablet (4 mg total) by mouth every 8 (eight) hours as needed for nausea or vomiting. (Patient taking differently: Take 4 mg by mouth daily as needed for nausea or vomiting.) 30 tablet 1   Probiotic Product (PROBIOTIC DAILY PO) Take 1 capsule  by mouth daily.     senna (SENOKOT) 8.6 MG TABS tablet Take 1 tablet by mouth daily.     traMADol (ULTRAM) 50 MG tablet Take 1 tablet (50 mg total) by mouth every 6 (six) hours as needed. 10 tablet 0   UNABLE TO FIND Place 1 application rectally daily as needed. [facility] Name: lidocaine 5% pramoxine 1% ointment per rectum as needed.     zolpidem (AMBIEN) 10 MG tablet TAKE ONE TABLET BY MOUTH DAILY AT BEDTIME. (Patient taking differently: Take 10 mg by mouth at bedtime.) 90 tablet 3   polyethylene glycol-electrolytes (TRILYTE) 420 g solution Take 4,000 mLs by mouth as directed. (Patient not taking: No sig reported) 4000 mL 0   predniSONE (DELTASONE) 20 MG tablet Take 2 tablets (40 mg total) by mouth daily. (Patient not taking: Reported on 08/13/2021) 10 tablet 0   No current facility-administered medications for this visit.    Allergies as of 08/13/2021 - Review Complete 08/13/2021  Allergen Reaction Noted   Nutmeg oil (myristica oil) Anaphylaxis 08/25/2018   Adhesive [tape] Other (See Comments) 03/10/2013   Aspirin Other (See Comments) 09/18/2011   Imitrex [sumatriptan] Hives 05/16/2021   Keflex [cephalexin] Other (See Comments) 11/26/2016   Levaquin [levofloxacin in d5w]  12/29/2016   Other  10/08/2017   Statins Other (See Comments) 02/28/2013   Sulfa antibiotics Other (See Comments) 12/10/2010   Sulfonamide derivatives Other (See Comments) 12/10/2010   Tramadol Other (See Comments) 07/23/2021   Latex Rash 12/13/2011    Family History  Problem Relation Age of Onset   Colon cancer Mother        54s   Urticaria Mother    Hypertension Mother    Cancer Mother    Cancer Father        oral   Crohn's disease Sister     Colon cancer Maternal Grandfather    Colon cancer Paternal Grandfather    Diabetes Brother    Colon cancer Maternal Grandmother    Anesthesia problems Neg Hx     Social History   Socioeconomic History   Marital status: Widowed    Spouse name: Not on file   Number of children: Not on file   Years of education: Not on file   Highest education level: Not on file  Occupational History   Occupation: homemaker    Employer: UNEMPLOYED  Tobacco Use   Smoking status: Former    Packs/day: 0.50    Years: 30.00    Pack years: 15.00    Types: Cigarettes   Smokeless tobacco: Former    Quit date: 10/15/1996   Tobacco comments:    Stopped smoking ~ 2000  Vaping Use   Vaping Use: Never used  Substance and Sexual Activity   Alcohol use: Yes    Alcohol/week: 4.0 standard drinks    Types: 4 Glasses of wine per week   Drug use: No   Sexual activity: Yes    Birth control/protection: Surgical  Other Topics Concern   Not on file  Social History Narrative   Does not routinely exercise. Husband passed in JUN 2015 due to prostate ca.   Social Determinants of Health   Financial Resource Strain: Low Risk    Difficulty of Paying Living Expenses: Not hard at all  Food Insecurity: No Food Insecurity   Worried About Charity fundraiser in the Last Year: Never true   Ran Out of Food in the Last Year: Never true  Transportation Needs: No  Transportation Needs   Lack of Transportation (Medical): No   Lack of Transportation (Non-Medical): No  Physical Activity: Sufficiently Active   Days of Exercise per Week: 7 days   Minutes of Exercise per Session: 60 min  Stress: No Stress Concern Present   Feeling of Stress : Not at all  Social Connections: Socially Isolated   Frequency of Communication with Friends and Family: More than three times a week   Frequency of Social Gatherings with Friends and Family: Never   Attends Religious Services: Never   Marine scientist or Organizations: No    Attends Archivist Meetings: Never   Marital Status: Widowed    Review of Systems: Gen: Denies fever, chills, anorexia. Denies fatigue, weakness, weight loss.  CV: Denies chest pain, palpitations, syncope, peripheral edema, and claudication. Resp: Denies dyspnea at rest, cough, wheezing, coughing up blood, and pleurisy. GI: see HPI Derm: Denies rash, itching, dry skin Psych: Denies depression, anxiety, memory loss, confusion. No homicidal or suicidal ideation.  Heme: Denies bruising, bleeding, and enlarged lymph nodes.  Physical Exam: BP 122/71   Pulse 85   Temp (!) 97.3 F (36.3 C)   Ht _0  (1.702 m)   Wt 207 lb 6.4 oz (94.1 kg)   BMI 32.48 kg/m  General:   Alert and oriented. No distress noted. Pleasant and cooperative.  Head:  Normocephalic and atraumatic. Eyes:  Conjuctiva clear without scleral icterus. Mouth:  mask in place Abdomen:  +BS, soft, non-tender and non-distended. No rebound or guarding. No HSM or masses noted. Msk:  Symmetrical without gross deformities. Normal posture. Extremities:  Without edema. Neurologic:  Alert and  oriented x4 Psych:  Alert and cooperative. Normal mood and affect.  ASSESSMENT: [name] is a 70 y.o. female presenting today with a history of IBS, GERD, NASH, chronic anal fissure and underwent recent repair. Colonoscopy July 2022: anal fissure, non-bleeding internal hemorrhoids, right colon diverticulosis, one 2 mm polyp in ascending, three 4-6 mm polyps in descending colon. Tubular adenomas. 5 year surveillance.   Now with diarrhea for several months and worsening over past 5 days. No exposure to antibiotics that she is aware. We will need to check stool studies to rule out infectious process. If negative, we can pursue supportive measures as hyoscyamine has been helpful in past. Dicyclomine without improvement historically for intermittent diarrhea.  GERD remains well-controlled on Dexilant.   PLAN:  Cdiff and GI path  panel Continue Dexilant Will attempt to help with transportation for patient via [facility] Further recommendations to follow Colonoscopy 2027   Annitta Needs, PhD, [facility]

## 2021-08-13 NOTE — Patient Instructions (Signed)
Please have stool studies done.  We will try to help with transportation to Dr. Marcello Moores.  Continue the [employer].  Further recommendations to follow!  I enjoyed seeing you again today! As you know, I value our relationship and want to provide genuine, compassionate, and quality care. I welcome your feedback. If you receive a survey regarding your visit,  I greatly appreciate you taking time to fill this out. See you next time!  Annitta Needs, PhD, ANP-BC [facility]

## 2021-08-21 ENCOUNTER — Encounter: Payer: Self-pay | Admitting: Gastroenterology

## 2021-08-25 ENCOUNTER — Other Ambulatory Visit: Payer: Self-pay

## 2021-08-25 ENCOUNTER — Telehealth: Payer: Self-pay | Admitting: "Endocrinology

## 2021-08-25 MED ORDER — LEVOTHYROXINE SODIUM 25 MCG PO TABS: 25.0000 ug | ORAL_TABLET | Freq: Every day | ORAL | 1 refills | Status: DC

## 2021-08-25 NOTE — Telephone Encounter (Signed)
Pt called back and said to please make sure that is a 90 day supply

## 2021-08-25 NOTE — Telephone Encounter (Signed)
Correct dosage of medication has been sent to the pharmacy requested.

## 2021-08-25 NOTE — Telephone Encounter (Signed)
Patient is requesting a refill on her levothyroxine, she said that goes to [facility]. She will no have enough to last her until next appt

## 2021-09-01 ENCOUNTER — Other Ambulatory Visit: Payer: Self-pay | Admitting: *Deleted

## 2021-09-01 ENCOUNTER — Telehealth: Payer: Self-pay

## 2021-09-01 ENCOUNTER — Telehealth: Payer: Self-pay | Admitting: Internal Medicine

## 2021-09-01 ENCOUNTER — Other Ambulatory Visit: Payer: Self-pay | Admitting: Internal Medicine

## 2021-09-01 DIAGNOSIS — R110 Nausea: Secondary | ICD-10-CM

## 2021-09-01 DIAGNOSIS — F5104 Psychophysiologic insomnia: Secondary | ICD-10-CM

## 2021-09-01 MED ORDER — NITROGLYCERIN 0.4 MG/SPRAY TL SOLN: 1.0000 | 3 refills | Status: DC | PRN

## 2021-09-01 MED ORDER — ONDANSETRON HCL 4 MG PO TABS: 4.0000 mg | ORAL_TABLET | Freq: Three times a day (TID) | ORAL | 1 refills | Status: DC | PRN

## 2021-09-01 MED ORDER — ZOLPIDEM TARTRATE 10 MG PO TABS: ORAL_TABLET | ORAL | 3 refills | Status: DC

## 2021-09-01 MED ORDER — HYOSCYAMINE SULFATE 0.125 MG PO TBDP: 0.1250 mg | ORAL_TABLET | Freq: Every day | ORAL | 0 refills | Status: DC | PRN

## 2021-09-01 NOTE — Telephone Encounter (Signed)
hyoscyamine (ANASPAZ) 0.125 MG TBDP disintergrating tablet   ondansetron (ZOFRAN) 4 MG tablet  zolpidem (AMBIEN) 10 MG tablet   nitroGLYCERIN (NITROLINGUAL) 0.4 MG/SPRAY spray    Nitrofurantoin ??    All 90 day supplies   Pt states if any problems please giver her a call    [place], [place]

## 2021-09-01 NOTE — Telephone Encounter (Signed)
error 

## 2021-09-01 NOTE — Telephone Encounter (Signed)
Hyoscyamine and nitroglycerin sent   Ondansetron has not been filled since march  Please send [name] if you wish also do not see nitrofurantoin on list please advise

## 2021-09-01 NOTE — Telephone Encounter (Signed)
[name] from [place] called can not get nitroGLYCERIN (NITROLINGUAL) 0.4 MG/SPRAY spray  from the wholesaler.  Please contact [name] back at [phone]

## 2021-09-02 ENCOUNTER — Other Ambulatory Visit: Payer: Self-pay | Admitting: Internal Medicine

## 2021-09-02 DIAGNOSIS — R0789 Other chest pain: Secondary | ICD-10-CM

## 2021-09-02 MED ORDER — NITROGLYCERIN 0.4 MG SL SUBL: 0.4000 mg | SUBLINGUAL_TABLET | SUBLINGUAL | 1 refills | Status: DC | PRN

## 2021-09-02 NOTE — Telephone Encounter (Signed)
Medication sent to pharmacy please let pt know nitrofurantoin is antibiotic and will need a virtual visit to discuss reason and refill

## 2021-09-03 ENCOUNTER — Other Ambulatory Visit: Payer: Self-pay | Admitting: *Deleted

## 2021-09-03 ENCOUNTER — Telehealth: Payer: Self-pay | Admitting: Internal Medicine

## 2021-09-03 MED ORDER — NITROGLYCERIN 0.4 MG/SPRAY TL SOLN: 1.0000 | 3 refills | Status: DC | PRN

## 2021-09-03 MED ORDER — HYOSCYAMINE SULFATE 0.125 MG PO TBDP: 0.1250 mg | ORAL_TABLET | Freq: Every day | ORAL | 0 refills | Status: DC | PRN

## 2021-09-03 NOTE — Telephone Encounter (Signed)
Please send 90 day supply to [facility]

## 2021-09-03 NOTE — Telephone Encounter (Signed)
[facility]

## 2021-09-03 NOTE — Telephone Encounter (Signed)
I just spoke with pt and [facility]ills [facility]

## 2021-09-04 ENCOUNTER — Telehealth: Payer: Self-pay | Admitting: Internal Medicine

## 2021-09-04 NOTE — Telephone Encounter (Signed)
Pt called in for refill on   zolpidem (AMBIEN) 10 MG tablet   [place]

## 2021-09-04 NOTE — Telephone Encounter (Signed)
Rx was already sent into pharmacy on 11/28

## 2021-09-08 DIAGNOSIS — E039 Hypothyroidism, unspecified: Secondary | ICD-10-CM | POA: Diagnosis not present

## 2021-09-08 DIAGNOSIS — A09 Infectious gastroenteritis and colitis, unspecified: Secondary | ICD-10-CM | POA: Diagnosis not present

## 2021-09-08 DIAGNOSIS — R197 Diarrhea, unspecified: Secondary | ICD-10-CM | POA: Diagnosis not present

## 2021-09-09 DIAGNOSIS — F4320 Adjustment disorder, unspecified: Secondary | ICD-10-CM | POA: Insufficient documentation

## 2021-09-09 LAB — COMPREHENSIVE METABOLIC PANEL
ALT: 27 IU/L (ref 0–32)
AST: 40 IU/L (ref 0–40)
Albumin/Globulin Ratio: 2 (ref 1.2–2.2)
Albumin: 4.5 g/dL (ref 3.8–4.8)
Alkaline Phosphatase: 55 IU/L (ref 44–121)
BUN/Creatinine Ratio: 14 (ref 12–28)
BUN: 16 mg/dL (ref 8–27)
Bilirubin Total: 0.7 mg/dL (ref 0.0–1.2)
CO2: 20 mmol/L (ref 20–29)
Calcium: 9.9 mg/dL (ref 8.7–10.3)
Chloride: 99 mmol/L (ref 96–106)
Creatinine, Ser: 1.17 mg/dL — ABNORMAL HIGH (ref 0.57–1.00)
Globulin, Total: 2.3 g/dL (ref 1.5–4.5)
Glucose: 116 mg/dL — ABNORMAL HIGH (ref 70–99)
Potassium: 4.5 mmol/L (ref 3.5–5.2)
Sodium: 136 mmol/L (ref 134–144)
Total Protein: 6.8 g/dL (ref 6.0–8.5)
eGFR: 51 mL/min/{1.73_m2} — ABNORMAL LOW (ref >59)

## 2021-09-09 LAB — TSH: TSH: 2.2 u[IU]/mL (ref 0.450–4.500)

## 2021-09-09 LAB — MAGNESIUM: Magnesium: 1.9 mg/dL (ref 1.6–2.3)

## 2021-09-09 LAB — T4, FREE: Free T4: 1.22 ng/dL (ref 0.82–1.77)

## 2021-09-10 ENCOUNTER — Telehealth: Payer: Self-pay

## 2021-09-10 LAB — CLOSTRIDIUM DIFFICILE EIA: C difficile Toxins A+B, EIA: NEGATIVE

## 2021-09-10 NOTE — Telephone Encounter (Signed)
Pt would like to have the results of her C.Diff. (I had given her the paperwork 08/13/2021 she just done it 09/08/2021. Please advise.

## 2021-09-11 LAB — GI PROFILE, STOOL, PCR

## 2021-09-15 ENCOUNTER — Telehealth: Payer: Self-pay

## 2021-09-15 MED ORDER — VANCOMYCIN HCL 125 MG PO CAPS: 125.0000 mg | ORAL_CAPSULE | Freq: Four times a day (QID) | ORAL | 0 refills | Status: AC

## 2021-09-15 NOTE — Telephone Encounter (Signed)
I have sent in vancomycin. Take this four times a day (every 6 hours). Full 10 days. Call if diarrhea does not improve. Avoid Imodium, dicyclomine, Levsin, etc. Avoid anti-diarrhea agents as we don't want her "stopped up".   Please see handout regarding Cdiff. Please send to patient. Make sure she is using a dedicated toilet. Wash hands well. Clean all surfaces with bleach solution. Please see below.

## 2021-09-15 NOTE — Telephone Encounter (Signed)
Dena, I actually sent MyChart message with the Cdiff information handout.

## 2021-09-15 NOTE — Telephone Encounter (Signed)
Phoned and spoke with the pt advised of [name]'s notes, recommendations sent to the pt's MyChart. Advised the pt to go to her MyChart and she did it while I was on the phone with her.

## 2021-09-15 NOTE — Telephone Encounter (Signed)
[name]  Pt called back stating if medication is to be called in please send to [facility] because they cover 100% while [facility] does not

## 2021-09-15 NOTE — Addendum Note (Signed)
Addended by: Annitta Needs on: 09/15/2021 12:42 PM   Modules accepted: Orders

## 2021-09-15 NOTE — Telephone Encounter (Signed)
Pt called back again stating she could not find information on C.Diff. i'm putting it in the mail to her and also she is stating that someone else she knows told her that her Dr has her on a yeast product while taking this medication. She also wants to know if she is to take a probiotic as well. Looking at her medication list she is already on a daily probiotic. Vicente Males please advise

## 2021-09-16 ENCOUNTER — Ambulatory Visit: Payer: Medicare Other | Admitting: "Endocrinology

## 2021-09-16 NOTE — Telephone Encounter (Signed)
Contacted/ messaged the pt in her MyChart

## 2021-09-16 NOTE — Telephone Encounter (Signed)
No need for yeast medication. If already on a probiotic, she can continue this. Literature has shown probiotic not necessary, but it won't hurt.

## 2021-09-17 ENCOUNTER — Other Ambulatory Visit: Payer: Self-pay

## 2021-09-17 ENCOUNTER — Encounter: Payer: Self-pay | Admitting: Internal Medicine

## 2021-09-17 ENCOUNTER — Telehealth (INDEPENDENT_AMBULATORY_CARE_PROVIDER_SITE_OTHER): Payer: Medicare Other | Admitting: Internal Medicine

## 2021-09-17 VITALS — BP 110/76 | HR 91 | Wt 207.0 lb

## 2021-09-17 DIAGNOSIS — K648 Other hemorrhoids: Secondary | ICD-10-CM

## 2021-09-17 DIAGNOSIS — K219 Gastro-esophageal reflux disease without esophagitis: Secondary | ICD-10-CM

## 2021-09-17 DIAGNOSIS — J4520 Mild intermittent asthma, uncomplicated: Secondary | ICD-10-CM | POA: Diagnosis not present

## 2021-09-17 DIAGNOSIS — A0472 Enterocolitis due to Clostridium difficile, not specified as recurrent: Secondary | ICD-10-CM

## 2021-09-17 DIAGNOSIS — I10 Essential (primary) hypertension: Secondary | ICD-10-CM | POA: Diagnosis not present

## 2021-09-17 HISTORY — DX: Enterocolitis due to Clostridium difficile, not specified as recurrent: A04.72

## 2021-09-17 NOTE — Assessment & Plan Note (Signed)
S/p repair surgery on 09/15/21, now feels better

## 2021-09-17 NOTE — Assessment & Plan Note (Addendum)
°  BP Readings from Last 1 Encounters:  09/17/21 110/76   Well-controlled with Irbesartan 75 mg QD Counseled for compliance with the medications Advised DASH diet and moderate exercise/walking as tolerated

## 2021-09-17 NOTE — Progress Notes (Signed)
Virtual Visit via Video Note   This visit type was conducted due to national recommendations for restrictions regarding the COVID-19 Pandemic (e.g. social distancing) in an effort to limit this patient's exposure and mitigate transmission in our community.  Due to her co-morbid illnesses, this patient is at least at moderate risk for complications without adequate follow up.  This format is felt to be most appropriate for this patient at this time.  All issues noted in this document were discussed and addressed.  A limited physical exam was performed with this format.     Evaluation Performed:  Follow-up visit  Date:  09/17/2021   ID:  [name], DOB 23-Jul-1951, MRN [id number]  Patient Location: Home Provider Location: Office/Clinic  Participants: Patient Location of Patient: Home Location of Provider: Telehealth Consent was obtain for visit to be over via telehealth. I verified that I am speaking with the correct person using two identifiers.  PCP:  Lindell Spar, MD   Chief Complaint: Follow up of her chronic medical conditions  History of Present Illness:    [name] is a 70 y.o. female with PMH of HTN, GERD, IBS-C, NASH, anal fissure, hypothyroidism, OA, DDD of lumbar spine, anxiety/panic disorder, insomnia, left sided blind eye, G6PD mutation and sickle cell trait who has a televisit for f/u of her chronic medical conditions.  She recently had hemorrhoid repair surgery, after which she had C. difficile colitis.  She is awaiting vancomycin from [place].  She did admit that she has been taking antidiarrheal agents for diarrhea and agrees not to take it now.  She denies any melena or hematochezia currently.  She has been taking probiotic as well.  She denies any nausea, vomiting or abdominal pain currently.  She has stopped taking Dexilant since she had C. difficile colitis.  She has been trying sweet tea to help with her acid reflux currently.  BP is  well-controlled. Takes medications regularly. Patient denies headache, dizziness, chest pain, dyspnea or palpitations.   The patient does not have symptoms concerning for COVID-19 infection (fever, chills, cough, or new shortness of breath).   Past Medical, Surgical, Social History, Allergies, and Medications have been Reviewed.  Past Medical History:  Diagnosis Date   Anxiety    Arthritis    Asthma    Cataract    [employer]reesia 01/05/2021   Chest pain    a. 02/2000 Cath: nl cors, EF 60%;  b. 10/2010 MV: nl LV, no ischemia/infarct;  c. 03/2014 Admit c/p, r/o->grief from husbands death.   Chronic RUQ pain December 24, 2007   EUS slightly dilated CBD (7.57m), otherwise nl   Depression    Eczema    Exposure to chemical inhalation mid 1970s   Fatty liver    G6PD deficiency    GERD (gastroesophageal reflux disease)    Glaucoma    History of pulmonary embolus (PE)    Treated with Eliquis 2Mar 20, 2016  HTN (hypertension)    Hypercholesteremia    a. intolerant to statins.   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 She has limited vision in the right eye.   Ocular migraine    Palpitations    Pneumonia 07/16/2021   Patient was exposed to a harsh chemical in the 1970's that created scar tissue in her lungs. She has had pneumonia several times in the past.  PONV (postoperative nausea and vomiting)    Recurrent upper respiratory infection (URI)    Renal cyst    Sickle cell trait (HCC)    Tubular adenoma of colon 09/17/2011   Urticaria    Past Surgical History:  Procedure Laterality Date   ABDOMINAL HYSTERECTOMY     ADENOIDECTOMY     ANGIOPLASTY     APPENDECTOMY     BIOPSY N/A 03/14/2013   Procedure: SMALL BOWEL AND GASTRIC BIOPSIES (Procedure #1);  Surgeon: Danie Binder, MD;  Location: [facility];  Service: Endoscopy;  Laterality: N/A;   CARDIAC CATHETERIZATION Left 02/11/2000   CATARACT EXTRACTION Left    CATARACT  EXTRACTION W/PHACO Right 03/19/2021   Procedure: CATARACT EXTRACTION PHACO AND INTRAOCULAR LENS PLACEMENT ([place]) RIGHT 6.75 00:50.4;  Surgeon: Leandrew Koyanagi, MD;  Location: [facility];  Service: Ophthalmology;  Laterality: Right;   CHOLECYSTECTOMY  12/2007   COLONOSCOPY  12/22/2010   TKP:TWSFKC, cecal adenomatous polyp   COLONOSCOPY WITH PROPOFOL N/A 03/31/2016   Dr. Oneida Alar: four sessile polyps rectum/sigmoid colon, diverticulosi, ext/int hemorrhoids. hyperplastic polyps, next tcs 5 years.   COLONOSCOPY WITH PROPOFOL N/A 04/21/2021   anal fissure, non-bleeding internal hemorrhoids, right colon diverticulosis, one 2 mm polyp in ascending, three 4-6 mm polyps in descending colon. Tubular adenomas. 5 year surveillance.   complete hysterectomy     ENTEROSCOPY N/A 03/14/2013   LEX:NTZG gastritis/ulcers has healed   ESOPHAGOGASTRODUODENOSCOPY  09/22/2011   YFV:CBSW gastritis/Duodenitis   EXCISIONAL HEMORRHOIDECTOMY     FLEXIBLE SIGMOIDOSCOPY N/A 03/14/2013   SLF:3 colon polyp removed/moderate sized internal hemorrhoids   FLEXIBLE SIGMOIDOSCOPY N/A 06/09/2016   Procedure: FLEXIBLE SIGMOIDOSCOPY;  Surgeon: Danie Binder, MD;  [facility];  Service: Endoscopy;  Laterality: N/A;  rectal polyps times 2   FLEXIBLE SIGMOIDOSCOPY N/A 03/18/2018   Procedure: FLEXIBLE SIGMOIDOSCOPY;  Surgeon: Danie Binder, MD;  [facility];  Service: Endoscopy;  Laterality: N/A;  9:15am   FOOT SURGERY     bunion removal left   GIVENS CAPSULE STUDY N/A 02/10/2013   Procedure: GIVENS CAPSULE STUDY;  Surgeon: Danie Binder, MD;  [facility]: [facility];  Service: Endoscopy;  Laterality: N/A;  [place]     right    HEMORRHOID BANDING N/A 03/14/2013   Procedure: HEMORRHOID BANDING (Procedure #3)  3 bands applied [id number] Exp 02/02/2014 ;  Surgeon: Danie Binder, MD;  Location: [facility];  Service: Endoscopy;  Laterality: N/A;   HEMORRHOID SURGERY N/A 04/24/2016    Procedure: EXTENSIVE HEMORRHOIDECTOMY;  Surgeon: Vickie Epley, MD;  Location: [facility];  Service: General;  Laterality: N/A;   LAPAROSCOPY     adhesions   POLYPECTOMY N/A 03/14/2013   KZL:DJTT Gastritis . ULCERS SEEN ON MAY 6 HAVE HEALED   POLYPECTOMY  03/31/2016   Procedure: POLYPECTOMY;  Surgeon: Danie Binder, MD;  [facility]: [facility];  Service: Endoscopy;;  sigmoid colon polyps x2, rectal polyps x2   POLYPECTOMY  04/21/2021   Procedure: POLYPECTOMY;  Surgeon: Eloise Harman, DO;  Location: [facility];  Service: Endoscopy;;   RECTAL EXAM UNDER ANESTHESIA N/A 07/23/2021   Procedure: RECTAL EXAM UNDER ANESTHESIA;  Surgeon: Leighton Ruff, MD;  Location: [facility];  Service: General;  Laterality: N/A;   SPHINCTEROTOMY N/A 07/23/2021   Procedure: CHEMICAL SPHINCTEROTOMY;  Surgeon: Leighton Ruff, MD;  Location: [facility];  Service: General;  Laterality: N/A;   TONSILLECTOMY     TUBAL LIGATION  No outpatient medications have been marked as taking for the 09/17/21 encounter (Video Visit) with Lindell Spar, MD.     Allergies:   Nutmeg oil (myristica oil), Adhesive [tape], Aspirin, Imitrex [sumatriptan], Keflex [cephalexin], Levaquin [levofloxacin in d5w], Other, Statins, Sulfa antibiotics, Sulfonamide derivatives, and Latex   ROS:   Please see the history of present illness.     All other systems reviewed and are negative.   Labs/Other Tests and Data Reviewed:    Recent Labs: 12/11/2020: Platelets 264 07/23/2021: Hemoglobin 13.6 09/08/2021: ALT 27; BUN 16; Creatinine, Ser 1.17; Magnesium 1.9; Potassium 4.5; Sodium 136; TSH 2.200   Recent Lipid Panel Lab Results  Component Value Date/Time   CHOL 228 (H) 12/11/2020 10:23 AM   CHOL 254 07/12/2012 07:55 AM   TRIG 231 (H) 12/11/2020 10:23 AM   TRIG 325 07/12/2012 07:55 AM   HDL 33 (L) 12/11/2020 10:23 AM   CHOLHDL 6.9 (H) 12/11/2020 10:23 AM   LDLCALC 156 (H) 12/11/2020 10:23 AM     Wt Readings from Last 3 Encounters:  09/17/21 207 lb (93.9 kg)  08/13/21 207 lb 6.4 oz (94.1 kg)  07/23/21 212 lb 11.2 oz (96.5 kg)     Objective:    Vital Signs:  BP 110/76 (Patient Position: Sitting)    Pulse 91    Wt 207 lb (93.9 kg)    SpO2 96%    BMI 32.42 kg/m    VITAL SIGNS:  reviewed GEN:  no acute distress EYES:  sclerae anicteric, EOMI - Extraocular Movements Intact NEURO:  alert and oriented x 3, no obvious focal deficit  ASSESSMENT & PLAN:    Essential hypertension, benign  BP Readings from Last 1 Encounters:  09/17/21 110/76   Well-controlled with Irbesartan 75 mg QD Counseled for compliance with the medications Advised DASH diet and moderate exercise/walking as tolerated  Hemorrhoids, internal, with bleeding S/p repair surgery on 09/15/21, now feels better  Asthma Well-controlled with Symbicort and PRN Albuterol  GERD (gastroesophageal reflux disease) Has stopped taking Dexilant since C. Diff. Colitis Avoid hot and spicy food  C. difficile diarrhea Awaiting Vancomycin Advised to avoid antidiarrheal agents for now Takes Probiotic   Time:   Today, I have spent 22 minutes reviewing the chart, including problem list, medications, and with the patient with telehealth technology discussing the above problems.   Medication Adjustments/Labs and Tests Ordered: Current medicines are reviewed at length with the patient today.  Concerns regarding medicines are outlined above.   Tests Ordered: No orders of the defined types were placed in this encounter.   Medication Changes: No orders of the defined types were placed in this encounter.    Note: This dictation was prepared with Dragon dictation along with smaller phrase technology. Similar sounding words can be transcribed inadequately or may not be corrected upon review. Any transcriptional errors that result from this process are unintentional.      Disposition:  Follow up  Signed, Lindell Spar, MD  09/17/2021 11:34 AM     [facility]

## 2021-09-17 NOTE — Assessment & Plan Note (Signed)
Awaiting Vancomycin Advised to avoid antidiarrheal agents for now Takes Probiotic

## 2021-09-17 NOTE — Assessment & Plan Note (Signed)
Has stopped taking Dexilant since C. Diff. Colitis Avoid hot and spicy food

## 2021-09-17 NOTE — Assessment & Plan Note (Signed)
Well-controlled with Symbicort and PRN Albuterol

## 2021-09-17 NOTE — Patient Instructions (Addendum)
Please avoid taking antidiarrheal medicines and start taking Vancomycin.  Please continue taking other medications as prescribed.

## 2021-09-18 ENCOUNTER — Telehealth: Payer: Medicare Other | Admitting: Internal Medicine

## 2021-09-19 ENCOUNTER — Ambulatory Visit: Payer: Medicare Other | Admitting: Internal Medicine

## 2021-09-30 ENCOUNTER — Telehealth: Payer: Self-pay

## 2021-09-30 NOTE — Telephone Encounter (Signed)
How many loose stools is she having a day? Is it watery or moreso soft ?

## 2021-09-30 NOTE — Telephone Encounter (Signed)
Pt called stating that she finished the c-diff medication the day before yesterday and she was ok until today. Pt states that she has started having diarrhea again today. Pt is wanting to know if another round can be called in to [facility]?

## 2021-09-30 NOTE — Telephone Encounter (Signed)
Pt states that she has only had two sm accidents in her pads. Pt states they happened when she sneezed. Pt states that it is like baby food but a little watery. Pt has not had any bm's in toilet. Pt was advised that we are going to hold off on prescribing more antibiotics at this time per Vicente Males. Pt will call us if she has more than 3 watery bm's a day.

## 2021-10-01 ENCOUNTER — Ambulatory Visit: Payer: Medicare Other | Admitting: "Endocrinology

## 2021-10-16 DIAGNOSIS — H35351 Cystoid macular degeneration, right eye: Secondary | ICD-10-CM | POA: Diagnosis not present

## 2021-10-20 ENCOUNTER — Ambulatory Visit (INDEPENDENT_AMBULATORY_CARE_PROVIDER_SITE_OTHER): Payer: Medicare Other | Admitting: "Endocrinology

## 2021-10-20 ENCOUNTER — Encounter: Payer: Self-pay | Admitting: "Endocrinology

## 2021-10-20 ENCOUNTER — Other Ambulatory Visit: Payer: Self-pay

## 2021-10-20 VITALS — BP 106/66 | HR 72 | Ht 67.0 in | Wt 215.0 lb

## 2021-10-20 DIAGNOSIS — E039 Hypothyroidism, unspecified: Secondary | ICD-10-CM | POA: Diagnosis not present

## 2021-10-20 MED ORDER — LEVOTHYROXINE SODIUM 25 MCG PO TABS: 25.0000 ug | ORAL_TABLET | Freq: Every day | ORAL | 1 refills | Status: DC

## 2021-10-20 NOTE — Progress Notes (Signed)
10/20/2021, 6:09 PM  Endocrinology follow-up note   Subjective:    Patient ID: [name], female    DOB: [birth year], PCP Lindell Spar, MD   Past Medical History:  Diagnosis Date   Anxiety    Arthritis    Asthma    Cataract    Phreesia 01/05/2021   Chest pain    a. 02/2000 Cath: nl cors, EF 60%;  b. 11-24-2010 MV: nl LV, no ischemia/infarct;  c. 03/2014 Admit c/p, r/o->grief from husbands death.   Chronic RUQ pain November 25, 2007   EUS slightly dilated CBD (7.7m), otherwise nl   Depression    Eczema    Exposure to chemical inhalation mid 1970s   Fatty liver    G6PD deficiency    GERD (gastroesophageal reflux disease)    Glaucoma    History of pulmonary embolus (PE)    Treated with Eliquis 22016-02-20  HTN (hypertension)    Hypercholesteremia    a. intolerant to statins.   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 She has limited vision in the right eye.   Ocular migraine    Palpitations    Pneumonia 07/16/2021   Patient was exposed to a harsh chemical in the 1970's that created scar tissue in her lungs. She has had pneumonia several times in the past.   PONV (postoperative nausea and vomiting)    Recurrent upper respiratory infection (URI)    Renal cyst    Sickle cell trait (HCC)    Tubular adenoma of colon 09/17/2011   Urticaria    Past Surgical History:  Procedure Laterality Date   ABDOMINAL HYSTERECTOMY     ADENOIDECTOMY     ANGIOPLASTY     APPENDECTOMY     BIOPSY N/A 03/14/2013   Procedure: SMALL BOWEL AND GASTRIC BIOPSIES (Procedure #1);  Surgeon: SDanie Binder MD;  Location: [facility];  Service: Endoscopy;  Laterality: N/A;   CARDIAC CATHETERIZATION Left 02/11/2000   CATARACT EXTRACTION Left    CATARACT EXTRACTION W/PHACO Right 03/19/2021   Procedure: CATARACT EXTRACTION PHACO AND INTRAOCULAR LENS PLACEMENT ([place] RIGHT 6.75  00:50.4;  Surgeon: BLeandrew Koyanagi MD;  [facility]  Service: Ophthalmology;  Laterality: Right;   CHOLECYSTECTOMY  12/2007   COLONOSCOPY  12/22/2010   RKZS:WFUXNA cecal adenomatous polyp   COLONOSCOPY WITH PROPOFOL N/A 03/31/2016   Dr. FOneida Alar four sessile polyps rectum/sigmoid colon, diverticulosi, ext/int hemorrhoids. hyperplastic polyps, next tcs 5 years.   COLONOSCOPY WITH PROPOFOL N/A 04/21/2021   anal fissure, non-bleeding internal hemorrhoids, right colon diverticulosis, one 2 mm polyp in ascending, three 4-6 mm polyps in descending colon. Tubular adenomas. 5 year surveillance.   complete hysterectomy     ENTEROSCOPY N/A 03/14/2013   STFT:DDUKgastritis/ulcers has healed   ESOPHAGOGASTRODUODENOSCOPY  09/22/2011   SGUR:KYHCgastritis/Duodenitis   EXCISIONAL HEMORRHOIDECTOMY     FLEXIBLE SIGMOIDOSCOPY N/A 03/14/2013   SLF:3 colon polyp removed/moderate sized internal hemorrhoids   FLEXIBLE SIGMOIDOSCOPY N/A 06/09/2016   Procedure: FLEXIBLE SIGMOIDOSCOPY;  Surgeon: SDanie Binder MD;  [facility];  Service: Endoscopy;  Laterality: N/A;  rectal polyps times 2   FLEXIBLE SIGMOIDOSCOPY N/A 03/18/2018   Procedure: FLEXIBLE SIGMOIDOSCOPY;  Surgeon: Danie Binder, MD;  [facility];  Service: Endoscopy;  Laterality: N/A;  9:15am   FOOT SURGERY     bunion removal left   GIVENS CAPSULE STUDY N/A 02/10/2013   Procedure: GIVENS CAPSULE STUDY;  Surgeon: Danie Binder, MD;  [facility];  Service: Endoscopy;  Laterality: N/A;  [place]     right    HEMORRHOID BANDING N/A 03/14/2013   Procedure: HEMORRHOID BANDING (Procedure #3)  3 bands applied VUD#[id number] Exp 02/02/2014 ;  Surgeon: Danie Binder, MD;  Location: [facility];  Service: Endoscopy;  Laterality: N/A;   HEMORRHOID SURGERY N/A 04/24/2016   Procedure: EXTENSIVE HEMORRHOIDECTOMY;  Surgeon: Vickie Epley, MD;  Location: [facility];  Service: General;  Laterality:  N/A;   LAPAROSCOPY     adhesions   POLYPECTOMY N/A 03/14/2013   NZV:JKQA Gastritis . ULCERS SEEN ON MAY 6 HAVE HEALED   POLYPECTOMY  03/31/2016   Procedure: POLYPECTOMY;  Surgeon: Danie Binder, MD;  [facility];  Service: Endoscopy;;  sigmoid colon polyps x2, rectal polyps x2   POLYPECTOMY  04/21/2021   Procedure: POLYPECTOMY;  Surgeon: Eloise Harman, DO;  [facility];  Service: Endoscopy;;   RECTAL EXAM UNDER ANESTHESIA N/A 07/23/2021   Procedure: RECTAL EXAM UNDER ANESTHESIA;  Surgeon: Leighton Ruff, MD;  Location: [facility];  Service: General;  Laterality: N/A;   SPHINCTEROTOMY N/A 07/23/2021   Procedure: CHEMICAL SPHINCTEROTOMY;  Surgeon: Leighton Ruff, MD;  Location: [place];  Service: General;  Laterality: N/A;   TONSILLECTOMY     TUBAL LIGATION     Social History   Socioeconomic History   Marital status: Widowed    Spouse name: Not on file   Number of children: Not on file   Years of education: Not on file   Highest education level: Not on file  Occupational History   Occupation: homemaker    Employer: UNEMPLOYED  Tobacco Use   Smoking status: Former    Packs/day: 0.50    Years: 30.00    Pack years: 15.00    Types: Cigarettes   Smokeless tobacco: Former    Quit date: 10/15/1996   Tobacco comments:    Stopped smoking ~ 2000  Vaping Use   Vaping Use: Never used  Substance and Sexual Activity   Alcohol use: Yes    Alcohol/week: 4.0 standard drinks    Types: 4 Glasses of wine per week   Drug use: No   Sexual activity: Yes    Birth control/protection: Surgical  Other Topics Concern   Not on file  Social History Narrative   Does not routinely exercise. Husband passed in JUN 2015 due to prostate ca.   Social Determinants of Health   Financial Resource Strain: Low Risk    Difficulty of Paying Living Expenses: Not hard at all  Food Insecurity: No Food Insecurity   Worried About Charity fundraiser  in the Last Year: Never true   [place] in the Last Year: Never true  Transportation Needs: No Transportation Needs   Lack of Transportation (Medical): No   Lack of Transportation (Non-Medical): No  Physical Activity: Sufficiently Active   Days of Exercise per Week: 7 days   Minutes of Exercise per Session: 60 min  Stress: No Stress Concern Present  Feeling of Stress : Not at all  Social Connections: Socially Isolated   Frequency of Communication with Friends and Family: More than three times a week   Frequency of Social Gatherings with Friends and Family: Never   Attends Religious Services: Never   Marine scientist or Organizations: No   Attends Archivist Meetings: Never   Marital Status: Widowed   Family History  Problem Relation Age of Onset   Colon cancer Mother        30s   Urticaria Mother    Hypertension Mother    Cancer Mother    Cancer Father        oral   Crohn's disease Sister    Colon cancer Maternal Grandfather    Colon cancer Paternal Grandfather    Diabetes Brother    Colon cancer Maternal Grandmother    Anesthesia problems Neg Hx    Outpatient Encounter Medications as of 10/20/2021  Medication Sig   Magnesium 500 MG TABS Take 1 tablet by mouth daily.   albuterol (PROVENTIL) (2.5 MG/3ML) 0.083% nebulizer solution Take 3 mLs (2.5 mg total) by nebulization every 4 (four) hours as needed for wheezing or shortness of breath.   albuterol (VENTOLIN HFA) 108 (90 Base) MCG/ACT inhaler Inhale 2 puffs into the lungs every 4 (four) hours as needed for wheezing.   ALPRAZolam (XANAX) 1 MG tablet Take 1 tablet (1 mg total) by mouth 2 (two) times daily as needed for anxiety. (Patient taking differently: Take 0.5-1 mg by mouth at bedtime.)   brimonidine (ALPHAGAN) 0.2 % ophthalmic solution Place 1 drop into both eyes 2 (two) times daily.   budesonide-formoterol (SYMBICORT) 160-4.5 MCG/ACT inhaler Inhale 2 puffs into the lungs as needed. (Patient taking  differently: Inhale 2 puffs into the lungs daily as needed (Asthma).)   chlorpheniramine-HYDROcodone (TUSSIONEX PENNKINETIC ER) 10-8 MG/5ML SUER Take 5 mLs by mouth every 12 (twelve) hours as needed. (Patient taking differently: Take 5 mLs by mouth every 12 (twelve) hours as needed for cough.)   Cholecalciferol (VITAMIN D3) 50 MCG (2000 UT) TABS Take 2,000 Units by mouth daily.   cyclobenzaprine (FLEXERIL) 10 MG tablet TAKE ONE TABLET BY MOUTH DAILY  as needed muscle spasms (Patient taking differently: Take 10 mg by mouth daily as needed (Arthritis and torn rotator cuff left).)   dexlansoprazole (DEXILANT) 60 MG capsule Take 1 capsule (60 mg total) by mouth daily. (Patient not taking: Reported on 10/20/2021)   dicyclomine (BENTYL) 10 MG capsule Take one capsule before meals and at bedtime AS NEEDED for abdominal pain and frequent stool. HOLD for constipation (Patient taking differently: Take 10 mg by mouth daily as needed (IBS).)   DUREZOL 0.05 % EMUL Place 1 drop into the right eye daily.   estradiol (ESTRACE VAGINAL) 0.1 MG/GM vaginal cream Place 1 Applicatorful vaginally daily as needed. (Patient taking differently: Place 1 Applicatorful vaginally daily as needed (Dryness).)   fenofibrate (TRICOR) 145 MG tablet Take 1 tablet (145 mg total) by mouth daily. (Patient taking differently: Take 145 mg by mouth at bedtime.)   hyoscyamine (ANASPAZ) 0.125 MG TBDP disintergrating tablet Place 1 tablet (0.125 mg total) under the tongue daily as needed (IBS). Place 0.125 mg under the tongue daily as needed (IBS).   irbesartan (AVAPRO) 150 MG tablet Take 0.5 tablets (75 mg total) by mouth daily.   latanoprost (XALATAN) 0.005 % ophthalmic solution Place 1 drop into both eyes at bedtime.   levothyroxine (SYNTHROID) 25 MCG tablet Take 1 tablet (25  mcg total) by mouth daily before breakfast.   Lidocaine-Hydrocortisone Ace 3-2.5 % KIT APPLY TO RECTUM QID for 2 weeks then AS NEEDED FOR RECTAL PAIN OR BLEEDING    Lifitegrast (XIIDRA) 5 % SOLN INSTILL 1 DROP IN BOTH EYES EVERY DAY AS NEEDED (EACH CONTAINER SHOULD YIELD 2 DROPS) STORE IN ORIGINAL FOIL POUCH (Patient taking differently: Place 1 drop into both eyes daily. (EACH CONTAINER SHOULD YIELD 2 DROPS) STORE IN ORIGINAL FOIL POUCH)   meclizine (ANTIVERT) 12.5 MG tablet Take 1 tablet (12.5 mg total) by mouth 3 (three) times daily as needed for dizziness. (Patient taking differently: Take 12.5 mg by mouth 3 (three) times daily as needed (vertigo).)   Menthol, Topical Analgesic, (BIOFREEZE EX) Apply 1 application topically daily as needed (pain).   metroNIDAZOLE (METROCREAM) 0.75 % cream Apply 1 application topically as needed. (Patient taking differently: Apply 1 application topically daily as needed (Rash on face).)   naproxen (NAPROSYN) 500 MG tablet Take 500 mg by mouth daily as needed for moderate pain or mild pain.   neomycin-polymyxin b-dexamethasone (MAXITROL) 3.5-10000-0.1 OINT Place 1 application into both eyes at bedtime as needed. (Patient taking differently: Place 1 application into both eyes at bedtime as needed (Dry eyes).)   nitroGLYCERIN (NITROLINGUAL) 0.4 MG/SPRAY spray Place 1 spray under the tongue every 5 (five) minutes as needed for chest pain.   nitroGLYCERIN (NITROSTAT) 0.4 MG SL tablet Place 1 tablet (0.4 mg total) under the tongue every 5 (five) minutes as needed for chest pain.   NON FORMULARY Take 1 tablet by mouth daily as needed (Stomach Bloat). FD GUARD   ondansetron (ZOFRAN) 4 MG tablet Take 1 tablet (4 mg total) by mouth every 8 (eight) hours as needed for nausea or vomiting.   Probiotic Product (PROBIOTIC DAILY PO) Take 1 capsule by mouth daily.   senna (SENOKOT) 8.6 MG TABS tablet Take 1 tablet by mouth daily.   traMADol (ULTRAM) 50 MG tablet Take 1 tablet (50 mg total) by mouth every 6 (six) hours as needed.   UNABLE TO FIND Place 1 application rectally daily as needed. Gem Name: lidocaine 5% pramoxine  1% ointment per rectum as needed.   zolpidem (AMBIEN) 10 MG tablet TAKE ONE TABLET BY MOUTH DAILY AT BEDTIME.   [DISCONTINUED] levothyroxine (SYNTHROID) 25 MCG tablet Take 1 tablet (25 mcg total) by mouth daily before breakfast.   [DISCONTINUED] magnesium oxide (HM MAGNESIUM) 400 MG tablet Take 2 tablets (800 mg total) by mouth daily. (Patient taking differently: Take 400 mg by mouth daily.)   No facility-administered encounter medications on file as of 10/20/2021.   ALLERGIES: Allergies  Allergen Reactions   Nutmeg Oil (Myristica Oil) Anaphylaxis    Nut oil- skin irritation. Nut oil- skin irritation.   Adhesive [Tape] Other (See Comments)    Blisters skin   Aspirin Other (See Comments)    sickle cell trait--  Not recommended unless emergency   Imitrex [Sumatriptan] Hives   Keflex [Cephalexin] Other (See Comments)    G6PD   Levaquin [Levofloxacin In D5w]     Altered mental status Affects G6PD   Other     Nut Oil- skin irritation    Statins Other (See Comments)    Myopathy - elevated CK Myopathy - elevated CK Myopathy-elevated CK   Sulfa Antibiotics Other (See Comments)    Patient has sickle cell trait Sickle cell trait.   Sulfonamide Derivatives Other (See Comments)    Patient has sickle cell trait   Latex  Rash    VACCINATION STATUS: Immunization History  Administered Date(s) Administered   Fluad Quad(high Dose 65+) 06/05/2019, 07/22/2020, 06/16/2021   Influenza Split 07/12/2012   Influenza,inj,Quad PF,6+ Mos 08/03/2014, 07/19/2015, 08/11/2016, 06/24/2017, 07/04/2018   Moderna Sars-Covid-2 Vaccination 11/16/2019, 12/15/2019, 08/02/2020   Pneumococcal Conjugate-13 02/15/2018   Pneumococcal Polysaccharide-23 08/03/2014, 01/12/2020   Tdap 08/25/2012, 08/03/2014   Zoster Recombinat (Shingrix) 01/16/2020, 03/21/2020   Zoster, Live 08/25/2012    HPI [name] is 71 y.o. female who presents today with a medical history as above. she is being seen in follow-up after  she was seen in consultation for hypercalcemia which has since resolved, hypothyroidism on low-dose levothyroxine 25 mcg p.o. daily before breakfast,, vitamin D deficiency.  She was also found to have hypomagnesemia for which she is on supplement.  PMD: Lindell Spar, MD.  She was seen with mild hypercalcemia, likely associated with hydrochlorothiazide.  She continues to have stabilization of her calcium level after she was advised to discontinue hydrochlorothiazide.   She was also found to have mild hypothyroidism for which she was initiated on low-dose levothyroxine 25 mcg p.o. daily before breakfast.  She benefited from this hormone was stabilization of her thyroid function tests towards target.  She has no new complaints today.  She continues to feel better.  She denies any prior history of parathyroid/pituitary/adrenal dysfunction. -She does have a family history of some thyroid dysfunction in her cousins.   She remains on magnesium 400 mg p.o. daily, previsit labs show magnesium at 1.7.    She made some changes in her lifestyle including avoiding some processed carbs. Denies history of diabetes/prediabetes.  She is on multiple vitamin supplements/medications including inhalers for asthma.  Review of Systems Limited as above.  Objective:    Vitals with BMI 10/20/2021 09/17/2021 08/13/2021  Height 5' 7" - 5' 7"  Weight 215 lbs 207 lbs 207 lbs 6 oz  BMI 44.62 - 86.38  Systolic 177 116 579  Diastolic 66 76 71  Pulse 72 91 85    BP 106/66    Pulse 72    Ht 5' 7" (1.702 m)    Wt 215 lb (97.5 kg)    BMI 33.67 kg/m   Wt Readings from Last 3 Encounters:  10/20/21 215 lb (97.5 kg)  09/17/21 207 lb (93.9 kg)  08/13/21 207 lb 6.4 oz (94.1 kg)    Physical Exam    CMP ( most recent) CMP     Component Value Date/Time   NA 136 09/08/2021 1514   K 4.5 09/08/2021 1514   K 4.3 07/12/2012 0755   CL 99 09/08/2021 1514   CL 100 07/12/2012 0755   CO2 20 09/08/2021 1514   GLUCOSE 116  (H) 09/08/2021 1514   GLUCOSE 125 (H) 07/23/2021 1024   BUN 16 09/08/2021 1514   CREATININE 1.17 (H) 09/08/2021 1514   CREATININE 1.23 (H) 12/11/2020 1023   CALCIUM 9.9 09/08/2021 1514   CALCIUM 11.0 07/12/2012 0755   PROT 6.8 09/08/2021 1514   PROT 8.1 07/12/2012 0755   ALBUMIN 4.5 09/08/2021 1514   AST 40 09/08/2021 1514   AST 42 07/12/2012 0755   ALT 27 09/08/2021 1514   ALKPHOS 55 09/08/2021 1514   ALKPHOS 32 07/12/2012 0755   BILITOT 0.7 09/08/2021 1514   BILITOT 1.0 07/12/2012 0755   GFRNONAA 36 (L) 07/22/2020 1441   GFRAA 41 (L) 07/22/2020 1441     Diabetic Labs (most recent): Lab Results  Component Value Date  HGBA1C 5.1 12/11/2020   HGBA1C 5.4 06/14/2019   HGBA1C 5.4 07/04/2018     Lipid Panel ( most recent) Lipid Panel     Component Value Date/Time   CHOL 228 (H) 12/11/2020 1023   CHOL 254 07/12/2012 0755   TRIG 231 (H) 12/11/2020 1023   TRIG 325 07/12/2012 0755   HDL 33 (L) 12/11/2020 1023   CHOLHDL 6.9 (H) 12/11/2020 1023   VLDL 42 (H) 05/07/2017 0918   LDLCALC 156 (H) 12/11/2020 1023      Lab Results  Component Value Date   TSH 2.200 09/08/2021   TSH 1.400 03/24/2021   TSH 1.770 09/23/2020   TSH 1.710 05/24/2020   TSH 5.12 (H) 01/12/2020   TSH 1.66 11/24/2016   TSH 3.900 07/29/2015   TSH 2.917 08/03/2014   TSH 6.160 (H) 03/31/2014   TSH 2.026 02/07/2013   FREET4 1.22 09/08/2021   FREET4 1.36 03/24/2021   FREET4 1.46 09/23/2020   FREET4 1.31 05/24/2020   FREET4 1.3 01/12/2020   FREET4 1.19 08/03/2014      Assessment & Plan:   1. Hypercalcemia  2.  Hypomagnesemia -Her previsit labs continue to show normalization of calcium after her HCTZ was discontinued.   She will not need 24-hour urine calcium measurement for now.  Her current calcium level at 9.9 is consistent with normocalcemia.  She will only continue magnesium supplement at 500 mg p.o. daily at breakfast.    3.  Hypothyroidism -Her previsit thyroid function tests are  consistent with appropriate replacement.  She is advised to continue levothyroxine 25 mcg p.o. daily before breakfast.     - We discussed about the correct intake of her thyroid hormone, on empty stomach at fasting, with water, separated by at least 30 minutes from breakfast and other medications,  and separated by more than 4 hours from calcium, iron, multivitamins, acid reflux medications (PPIs). -Patient is made aware of the fact that thyroid hormone replacement is needed for life, dose to be adjusted by periodic monitoring of thyroid function tests.    -Her neck exam is negative for goiter, no need for thyroid imaging for now. Her supraclavicular fullness is due to increased intrathoracic pressure due to asthma/COPD.    - she acknowledges that there is a room for improvement in her food and drink choices. - Suggestion is made for her to avoid simple carbohydrates  from her diet including Cakes, Sweet Desserts, Ice Cream, Soda (diet and regular), Sweet Tea, Candies, Chips, Cookies, Store Bought Juices, Alcohol , Artificial Sweeteners,  Coffee Creamer, and "Sugar-free" Products, Lemonade. This will help patient to have more stable blood glucose profile and potentially avoid unintended weight gain.  The following Lifestyle Medicine recommendations according to [facility]) were discussed and and offered to patient and she  agrees to start the journey:  A. Whole Foods, Plant-Based Nutrition comprising of fruits and vegetables, plant-based proteins, whole-grain carbohydrates was discussed in detail with the patient.   A list for source of those nutrients were also provided to the patient.  Patient will use only water or unsweetened tea for hydration.    - she is advised to maintain close follow up with Lindell Spar, MD for primary care needs.    I spent 31 minutes in the care of the patient today including review of labs from Thyroid Function, CMP, and other  relevant labs ; imaging/biopsy records (current and previous including abstractions from other facilities); face-to-face time discussing  her lab results and symptoms, medications doses, her options of short and long term treatment based on the latest standards of care / guidelines;   and documenting the encounter.  Laurena Valko Thursby  participated in the discussions, expressed understanding, and voiced agreement with the above plans.  All questions were answered to her satisfaction. she is encouraged to contact clinic should she have any questions or concerns prior to her return visit.     Follow up plan: Return in about 6 months (around 04/19/2022) for F/U with Pre-visit Labs.   Glade Lloyd, MD [facility] [address] [place], [place] [address] Phone: [phone]  Fax: [phone]     10/20/2021, 6:09 PM  This note was partially dictated with voice recognition software. Similar sounding words can be transcribed inadequately or may not  be corrected upon review.

## 2021-10-22 NOTE — Progress Notes (Signed)
Cardiology Office Note  Date: 10/23/2021   ID: JORENE KAYLOR, DOB 1951-08-01, MRN [id number]  PCP:  Lindell Spar, MD  Cardiologist:  Rozann Lesches, MD Electrophysiologist:  None   Chief Complaint  Patient presents with   Cardiac follow-up    History of Present Illness: [name] is a 71 y.o. female last seen in April 2022.  She is here for a routine visit.  She has a history of recurring chest pain over time with reassuring ischemic work-up including by Leane Call in May of last year which was low risk.  She states that symptoms are better when she takes Xanax, only rarely uses nitroglycerin.  She has also been experiencing a discomfort, achy sensation in her right groin and down her right leg at times.  Not clearly claudication, no unusual or asymmetric swelling on that side.  She is following with Dr. Posey Pronto for primary care.  I reviewed her medications which are noted below.  Blood pressure running low normal range, she is asymptomatic with this.  No syncope.  She has a history of abnormal LFTs on statin therapy.  Her last LDL was 156 in March 2022.  She will continue to have follow-up with her PCP for repeat lab work.  She does have atherosclerosis evident by CT imaging.  I personally reviewed her ECG today which shows sinus rhythm with nonspecific T wave changes and low voltage.  Past Medical History:  Diagnosis Date   Anxiety    Arthritis    Asthma    Cataract    Phreesia 01/05/2021   Chest pain    a. 02/2000 Cath: nl cors, EF 60%;  b. 10/2010 MV: nl LV, no ischemia/infarct;  c. 03/2014 Admit c/p, r/o->grief from husbands death.   Chronic RUQ pain 12/25/07   EUS slightly dilated CBD (7.45m), otherwise nl   Depression    Eczema    Exposure to chemical inhalation mid 1970s   Fatty liver    G6PD deficiency    GERD (gastroesophageal reflux disease)    Glaucoma    History of pulmonary embolus (PE)    Treated with Eliquis 203/21/16  HTN (hypertension)     Hypercholesteremia    a. intolerant to statins.   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 She has limited vision in the right eye.   Ocular migraine    Palpitations    Pneumonia 07/16/2021   Patient was exposed to a harsh chemical in the 1970's that created scar tissue in her lungs. She has had pneumonia several times in the past.   PONV (postoperative nausea and vomiting)    Recurrent upper respiratory infection (URI)    Renal cyst    Sickle cell trait (HCC)    Tubular adenoma of colon 09/17/2011   Urticaria     Past Surgical History:  Procedure Laterality Date   ABDOMINAL HYSTERECTOMY     ADENOIDECTOMY     ANGIOPLASTY     APPENDECTOMY     BIOPSY N/A 03/14/2013   Procedure: SMALL BOWEL AND GASTRIC BIOPSIES (Procedure #1);  Surgeon: SDanie Binder MD;  Location: [facility];  Service: Endoscopy;  Laterality: N/A;   CARDIAC CATHETERIZATION Left 02/11/2000   CATARACT EXTRACTION Left    CATARACT EXTRACTION W/PHACO Right 03/19/2021   Procedure: CATARACT EXTRACTION PHACO AND INTRAOCULAR LENS PLACEMENT (IOC) RIGHT 6.75 00:50.4;  Surgeon: Leandrew Koyanagi, MD;  Location: [place];  Service: Ophthalmology;  Laterality: Right;   CHOLECYSTECTOMY  12/2007   COLONOSCOPY  12/22/2010   EHO:ZYYQMG, cecal adenomatous polyp   COLONOSCOPY WITH PROPOFOL N/A 03/31/2016   Dr. Oneida Alar: four sessile polyps rectum/sigmoid colon, diverticulosi, ext/int hemorrhoids. hyperplastic polyps, next tcs 5 years.   COLONOSCOPY WITH PROPOFOL N/A 04/21/2021   anal fissure, non-bleeding internal hemorrhoids, right colon diverticulosis, one 2 mm polyp in ascending, three 4-6 mm polyps in descending colon. Tubular adenomas. 5 year surveillance.   complete hysterectomy     ENTEROSCOPY N/A 03/14/2013   NOI:BBCW gastritis/ulcers has healed   ESOPHAGOGASTRODUODENOSCOPY  09/22/2011   UGQ:BVQX gastritis/Duodenitis    EXCISIONAL HEMORRHOIDECTOMY     FLEXIBLE SIGMOIDOSCOPY N/A 03/14/2013   SLF:3 colon polyp removed/moderate sized internal hemorrhoids   FLEXIBLE SIGMOIDOSCOPY N/A 06/09/2016   Procedure: FLEXIBLE SIGMOIDOSCOPY;  Surgeon: Danie Binder, MD;  [facility];  Service: Endoscopy;  Laterality: N/A;  rectal polyps times 2   FLEXIBLE SIGMOIDOSCOPY N/A 03/18/2018   Procedure: FLEXIBLE SIGMOIDOSCOPY;  Surgeon: Danie Binder, MD;  [facility];  Service: Endoscopy;  Laterality: N/A;  9:15am   FOOT SURGERY     bunion removal left   GIVENS CAPSULE STUDY N/A 02/10/2013   Procedure: GIVENS CAPSULE STUDY;  Surgeon: Danie Binder, MD;  [facility]: [facility];  Service: Endoscopy;  Laterality: N/A;  [place]     right    HEMORRHOID BANDING N/A 03/14/2013   Procedure: HEMORRHOID BANDING (Procedure #3)  3 bands applied [id number]#[id number] Exp 02/02/2014 ;  Surgeon: Danie Binder, MD;  Location: [facility];  Service: Endoscopy;  Laterality: N/A;   HEMORRHOID SURGERY N/A 04/24/2016   Procedure: EXTENSIVE HEMORRHOIDECTOMY;  Surgeon: Vickie Epley, MD;  Location: [facility];  Service: General;  Laterality: N/A;   LAPAROSCOPY     adhesions   POLYPECTOMY N/A 03/14/2013   LKJ:ZPHX Gastritis . ULCERS SEEN ON MAY 6 HAVE HEALED   POLYPECTOMY  03/31/2016   Procedure: POLYPECTOMY;  Surgeon: Danie Binder, MD;  [facility];  Service: Endoscopy;;  sigmoid colon polyps x2, rectal polyps x2   POLYPECTOMY  04/21/2021   Procedure: POLYPECTOMY;  Surgeon: Eloise Harman, DO;  [facility];  Service: Endoscopy;;   RECTAL EXAM UNDER ANESTHESIA N/A 07/23/2021   Procedure: RECTAL EXAM UNDER ANESTHESIA;  Surgeon: Leighton Ruff, MD;  Location: [place];  Service: General;  Laterality: N/A;   SPHINCTEROTOMY N/A 07/23/2021   Procedure: CHEMICAL SPHINCTEROTOMY;  Surgeon: Leighton Ruff, MD;  Location: [place];  Service: General;  Laterality:  N/A;   TONSILLECTOMY     TUBAL LIGATION      Current Outpatient Medications  Medication Sig Dispense Refill   albuterol (PROVENTIL) (2.5 MG/3ML) 0.083% nebulizer solution Take 3 mLs (2.5 mg total) by nebulization every 4 (four) hours as needed for wheezing or shortness of breath. 150 mL 3   albuterol (VENTOLIN HFA) 108 (90 Base) MCG/ACT inhaler Inhale 2 puffs into the lungs every 4 (four) hours as needed for wheezing. 54 g 3   ALPRAZolam (XANAX) 1 MG tablet Take 1 tablet (1 mg total) by mouth 2 (two) times daily as needed for anxiety. (Patient taking differently: Take 0.5-1 mg by mouth at bedtime.) 180 tablet 3   brimonidine (ALPHAGAN) 0.2 % ophthalmic solution Place 1 drop into both eyes 2 (two) times daily. 15 mL 3   budesonide-formoterol (  SYMBICORT) 160-4.5 MCG/ACT inhaler Inhale 2 puffs into the lungs as needed. (Patient taking differently: Inhale 2 puffs into the lungs daily as needed (Asthma).) 10.2 g 3   chlorpheniramine-HYDROcodone (TUSSIONEX PENNKINETIC ER) 10-8 MG/5ML SUER Take 5 mLs by mouth every 12 (twelve) hours as needed. (Patient taking differently: Take 5 mLs by mouth every 12 (twelve) hours as needed for cough.) 140 mL 0   Cholecalciferol (VITAMIN D3) 50 MCG (2000 UT) TABS Take 2,000 Units by mouth daily.     cyclobenzaprine (FLEXERIL) 10 MG tablet TAKE ONE TABLET BY MOUTH DAILY  as needed muscle spasms (Patient taking differently: Take 10 mg by mouth daily as needed (Arthritis and torn rotator cuff left).) 90 tablet 3   dicyclomine (BENTYL) 10 MG capsule Take one capsule before meals and at bedtime AS NEEDED for abdominal pain and frequent stool. HOLD for constipation (Patient taking differently: Take 10 mg by mouth daily as needed (IBS).) 120 capsule 1   DUREZOL 0.05 % EMUL Place 1 drop into the right eye daily.     estradiol (ESTRACE VAGINAL) 0.1 MG/GM vaginal cream Place 1 Applicatorful vaginally daily as needed. (Patient taking differently: Place 1 Applicatorful vaginally  daily as needed (Dryness).) 42.5 g 3   fenofibrate (TRICOR) 145 MG tablet Take 1 tablet (145 mg total) by mouth daily. (Patient taking differently: Take 145 mg by mouth at bedtime.) 90 tablet 3   hyoscyamine (ANASPAZ) 0.125 MG TBDP disintergrating tablet Place 1 tablet (0.125 mg total) under the tongue daily as needed (IBS). Place 0.125 mg under the tongue daily as needed (IBS). 30 tablet 0   irbesartan (AVAPRO) 150 MG tablet Take 0.5 tablets (75 mg total) by mouth daily. 45 tablet 3   latanoprost (XALATAN) 0.005 % ophthalmic solution Place 1 drop into both eyes at bedtime. 9 mL 3   levothyroxine (SYNTHROID) 25 MCG tablet Take 1 tablet (25 mcg total) by mouth daily before breakfast. 95 tablet 1   Lidocaine-Hydrocortisone Ace 3-2.5 % KIT APPLY TO RECTUM QID for 2 weeks then AS NEEDED FOR RECTAL PAIN OR BLEEDING 1 kit 11   Lifitegrast (XIIDRA) 5 % SOLN INSTILL 1 DROP IN BOTH EYES EVERY DAY AS NEEDED (EACH CONTAINER SHOULD YIELD 2 DROPS) STORE IN ORIGINAL FOIL POUCH (Patient taking differently: Place 1 drop into both eyes daily. (EACH CONTAINER SHOULD YIELD 2 DROPS) STORE IN ORIGINAL FOIL POUCH) 60 each 3   Magnesium 500 MG TABS Take 1 tablet by mouth daily.     meclizine (ANTIVERT) 12.5 MG tablet Take 1 tablet (12.5 mg total) by mouth 3 (three) times daily as needed for dizziness. (Patient taking differently: Take 12.5 mg by mouth 3 (three) times daily as needed (vertigo).) 30 tablet 3   Menthol, Topical Analgesic, (BIOFREEZE EX) Apply 1 application topically daily as needed (pain).     metroNIDAZOLE (METROCREAM) 0.75 % cream Apply 1 application topically as needed. (Patient taking differently: Apply 1 application topically daily as needed (Rash on face).) 45 g 3   naproxen (NAPROSYN) 500 MG tablet Take 500 mg by mouth daily as needed for moderate pain or mild pain.     neomycin-polymyxin b-dexamethasone (MAXITROL) 3.5-10000-0.1 OINT Place 1 application into both eyes at bedtime as needed. (Patient taking  differently: Place 1 application into both eyes at bedtime as needed (Dry eyes).) 10.5 g 3   nitroGLYCERIN (NITROLINGUAL) 0.4 MG/SPRAY spray Place 1 spray under the tongue every 5 (five) minutes as needed for chest pain. 12 g 3   NON FORMULARY  Take 1 tablet by mouth daily as needed (Stomach Bloat). FD GUARD     ondansetron (ZOFRAN) 4 MG tablet Take 1 tablet (4 mg total) by mouth every 8 (eight) hours as needed for nausea or vomiting. 30 tablet 1   Probiotic Product (PROBIOTIC DAILY PO) Take 1 capsule by mouth daily.     senna (SENOKOT) 8.6 MG TABS tablet Take 1 tablet by mouth daily.     traMADol (ULTRAM) 50 MG tablet Take 1 tablet (50 mg total) by mouth every 6 (six) hours as needed. 10 tablet 0   UNABLE TO FIND Place 1 application rectally daily as needed. [place] Name: lidocaine 5% pramoxine 1% ointment per rectum as needed.     zolpidem (AMBIEN) 10 MG tablet TAKE ONE TABLET BY MOUTH DAILY AT BEDTIME. 90 tablet 3   No current facility-administered medications for this visit.   Allergies:  Nutmeg oil (myristica oil), Adhesive [tape], Aspirin, Imitrex [sumatriptan], Keflex [cephalexin], Levaquin [levofloxacin in d5w], Other, Statins, Sulfa antibiotics, Sulfonamide derivatives, and Latex   ROS: No orthopnea or PND.  Physical Exam: VS:  BP 114/68    Pulse 87    Ht 5' 7"  (1.702 m)    Wt 215 lb 9.6 oz (97.8 kg)    SpO2 97%    BMI 33.77 kg/m , BMI Body mass index is 33.77 kg/m.  Wt Readings from Last 3 Encounters:  10/23/21 215 lb 9.6 oz (97.8 kg)  10/20/21 215 lb (97.5 kg)  09/17/21 207 lb (93.9 kg)    General: Patient appears comfortable at rest. HEENT: Conjunctiva and lids normal, wearing a mask. Neck: Supple, no elevated JVP or carotid bruits, no thyromegaly. Lungs: Clear to auscultation, nonlabored breathing at rest. Cardiac: Regular rate and rhythm, no S3 or significant systolic murmur, no pericardial rub. Extremities: No pitting edema.  ECG:  An ECG dated  01/30/2021 was personally reviewed today and demonstrated:  Sinus rhythm, rule out old inferior infarct pattern, nonspecific T wave abnormalities.  Recent Labwork: 12/11/2020: Platelets 264 07/23/2021: Hemoglobin 13.6 09/08/2021: ALT 27; AST 40; BUN 16; Creatinine, Ser 1.17; Magnesium 1.9; Potassium 4.5; Sodium 136; TSH 2.200     Component Value Date/Time   CHOL 228 (H) 12/11/2020 1023   CHOL 254 07/12/2012 0755   TRIG 231 (H) 12/11/2020 1023   TRIG 325 07/12/2012 0755   HDL 33 (L) 12/11/2020 1023   CHOLHDL 6.9 (H) 12/11/2020 1023   VLDL 42 (H) 05/07/2017 0918   LDLCALC 156 (H) 12/11/2020 1023    Other Studies Reviewed Today:  Echocardiogram 08/23/2019:  1. Left ventricular ejection fraction, by visual estimation, is 65 to  70%. The left ventricle has normal function. There is mildly increased  left ventricular hypertrophy.   2. Left ventricular diastolic parameters are consistent with Grade I  diastolic dysfunction (impaired relaxation).   3. The left ventricle has no regional wall motion abnormalities.   4. Global right ventricle has normal systolic function.The right  ventricular size is normal. No increase in right ventricular wall  thickness.   5. Left atrial size was normal.   6. Right atrial size was normal.   7. The mitral valve is grossly normal. No evidence of mitral valve  regurgitation.   8. The tricuspid valve is grossly normal. Tricuspid valve regurgitation  is not demonstrated.   9. The aortic valve is tricuspid. Aortic valve regurgitation is not  visualized. No evidence of aortic valve sclerosis or stenosis.  10. The pulmonic valve was grossly normal.  Pulmonic valve regurgitation is  not visualized.  11. The inferior vena cava is normal in size with greater than 50%  respiratory variability, suggesting right atrial pressure of 3 mmHg.   Lexiscan Myoview 02/04/2021: There was no ST segment deviation noted during stress. The left ventricular ejection fraction is  hyperdynamic (>65%). This is a low risk study. The study is normal. There are no perfusion defects  Assessment and Plan:  1.  She had recurrent chest pain over time with reassuring ischemic work-up, possibly associated with endothelial dysfunction or coronary vasospasm.  She reports improvement with Xanax, rarely uses nitroglycerin.  Follow-up ischemic testing in May 2022 was reassuring.  ECG reviewed today.  At this point no changes were made.  2.  Right groin and leg discomfort.  We will obtain lower extremity arterial Dopplers and ABIs.  3.  History of essential hypertension, remains on Avapro with low normal blood pressure.  4.  History of mixed hyperlipidemia, prior LFT abnormalities on statin therapy.  Continue to follow lipid panel with PCP.  She is on Tricor at this point.  Medication Adjustments/Labs and Tests Ordered: Current medicines are reviewed at length with the patient today.  Concerns regarding medicines are outlined above.   Tests Ordered: Orders Placed This Encounter  Procedures   EKG 12-Lead   VAS [place] LOWER EXT ART SEG MULTI (SEGMENTALS & LE RAYNAUDS)    Medication Changes: No orders of the defined types were placed in this encounter.   Disposition:  Follow up  6 months.  Signed, Satira Sark, MD, [facility] 10/23/2021 10:25 AM    [facility] [address], [place], [place] [address] Phone: [phone]; Fax: [phone]

## 2021-10-23 ENCOUNTER — Encounter: Payer: Self-pay | Admitting: Cardiology

## 2021-10-23 ENCOUNTER — Ambulatory Visit (INDEPENDENT_AMBULATORY_CARE_PROVIDER_SITE_OTHER): Payer: Medicare Other | Admitting: Cardiology

## 2021-10-23 VITALS — BP 114/68 | HR 87 | Ht 67.0 in | Wt 215.6 lb

## 2021-10-23 DIAGNOSIS — E782 Mixed hyperlipidemia: Secondary | ICD-10-CM

## 2021-10-23 DIAGNOSIS — M79604 Pain in right leg: Secondary | ICD-10-CM

## 2021-10-23 DIAGNOSIS — R079 Chest pain, unspecified: Secondary | ICD-10-CM | POA: Diagnosis not present

## 2021-10-23 NOTE — Patient Instructions (Addendum)
Medication Instructions:  .Your physician recommends that you continue on your current medications as directed. Please refer to the Current Medication list given to you today.  Labwork: none  Testing/Procedures: Your physician has requested that you have a lower extremity arterial exercise duplex with ABI's. During this test, exercise and ultrasound are used to evaluate arterial blood flow in the legs. Allow one hour for this exam. There are no restrictions or special instructions.  Follow-Up: Your physician recommends that you schedule a follow-up appointment in: 6 months  Any Other Special Instructions Will Be Listed Below (If Applicable).  If you need a refill on your cardiac medications before your next appointment, please call your pharmacy.

## 2021-11-05 DIAGNOSIS — Z20822 Contact with and (suspected) exposure to covid-19: Secondary | ICD-10-CM | POA: Diagnosis not present

## 2021-11-07 DIAGNOSIS — H35351 Cystoid macular degeneration, right eye: Secondary | ICD-10-CM | POA: Diagnosis not present

## 2021-11-14 ENCOUNTER — Ambulatory Visit: Payer: Medicare Other | Admitting: Nurse Practitioner

## 2021-11-17 ENCOUNTER — Other Ambulatory Visit: Payer: Self-pay

## 2021-11-17 ENCOUNTER — Encounter: Payer: Self-pay | Admitting: Internal Medicine

## 2021-11-17 ENCOUNTER — Ambulatory Visit (INDEPENDENT_AMBULATORY_CARE_PROVIDER_SITE_OTHER): Payer: Medicare Other | Admitting: Internal Medicine

## 2021-11-17 VITALS — BP 122/86 | HR 94 | Resp 18 | Ht 67.0 in | Wt 216.1 lb

## 2021-11-17 DIAGNOSIS — M48061 Spinal stenosis, lumbar region without neurogenic claudication: Secondary | ICD-10-CM

## 2021-11-17 DIAGNOSIS — M5137 Other intervertebral disc degeneration, lumbosacral region: Secondary | ICD-10-CM

## 2021-11-17 MED ORDER — HYDROCODONE-ACETAMINOPHEN 5-325 MG PO TABS: 1.0000 | ORAL_TABLET | Freq: Four times a day (QID) | ORAL | 0 refills | Status: DC | PRN

## 2021-11-17 MED ORDER — KETOROLAC TROMETHAMINE 60 MG/2ML IM SOLN
60.0000 mg | Freq: Once | INTRAMUSCULAR | Status: AC
  Administered 2021-11-17: 60 mg via INTRAMUSCULAR

## 2021-11-17 MED ORDER — NAPROXEN 500 MG PO TABS: 500.0000 mg | ORAL_TABLET | Freq: Every day | ORAL | 0 refills | Status: DC | PRN

## 2021-11-17 NOTE — Assessment & Plan Note (Signed)
Refilled Norco for now Avoid heavy lifting and frequent bending Advised to use back brace and/or heating pad as needed

## 2021-11-17 NOTE — Patient Instructions (Signed)
Please take Naproxen as needed for mild-moderate back pain. Please take Norco as needed for severe pain.  Please avoid heavy lifting and frequent bending.  Okay to apply heating pad and back brace for back pain.

## 2021-11-17 NOTE — Progress Notes (Signed)
Acute Office Visit  Subjective:    Patient ID: [name], female    DOB: [birth year], 71 y.o.   MRN: [id number]  Chief Complaint  Patient presents with   Back Pain    HPI Patient is in today for complaint of acute on chronic low back pain on right side, which is worse for the last 10 days.  She reports that she had to clean her room after she was recently diagnosed with C. difficile colitis.  She lives by herself, and has to lift/move heavy objects at home at times and has to bend at times as well.  She denies any recent fall or injury.  Her pain is worse with prolonged sitting and with movement.  She denies any saddle anesthesia, urinary or stool incontinence.  She has a remote history of fall on the right side in Dec 22, 2017, when she had imaging.  MRI of lumbar spine showed - Prominent lumbar facet arthrosis, most severe at L5-S1 where there is grade 1 anterolisthesis and mild bilateral neural foraminal stenosis. Mild multifactorial spinal stenosis at L4-5.  Past Medical History:  Diagnosis Date   Anxiety    Arthritis    Asthma    Cataract    Phreesia 01/05/2021   Chest pain    a. 02/2000 Cath: nl cors, EF 60%;  b. 10/2010 MV: nl LV, no ischemia/infarct;  c. 03/2014 Admit c/p, r/o->grief from husbands death.   Chronic RUQ pain December 23, 2007   EUS slightly dilated CBD (7.52m), otherwise nl   Depression    Eczema    Exposure to chemical inhalation mid 1970s   Fatty liver    G6PD deficiency    GERD (gastroesophageal reflux disease)    Glaucoma    History of pulmonary embolus (PE)    Treated with Eliquis 22016/03/19  HTN (hypertension)    Hypercholesteremia    a. intolerant to statins.   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 She has limited vision in the right eye.   Ocular migraine    Palpitations    Pneumonia 07/16/2021   Patient was exposed to a harsh chemical in the 1970's that created scar  tissue in her lungs. She has had pneumonia several times in the past.   PONV (postoperative nausea and vomiting)    Recurrent upper respiratory infection (URI)    Renal cyst    Sickle cell trait (HCC)    Tubular adenoma of colon 09/17/2011   Urticaria     Past Surgical History:  Procedure Laterality Date   ABDOMINAL HYSTERECTOMY     ADENOIDECTOMY     ANGIOPLASTY     APPENDECTOMY     BIOPSY N/A 03/14/2013   Procedure: SMALL BOWEL AND GASTRIC BIOPSIES (Procedure #1);  Surgeon: SDanie Binder MD;  Location: [facility];  Service: Endoscopy;  Laterality: N/A;   CARDIAC CATHETERIZATION Left 02/11/2000   CATARACT EXTRACTION Left    CATARACT EXTRACTION W/PHACO Right 03/19/2021   Procedure: CATARACT EXTRACTION PHACO AND INTRAOCULAR LENS PLACEMENT (IOasis RIGHT 6.75 00:50.4;  Surgeon: BLeandrew Koyanagi MD;  [facility]  Service: Ophthalmology;  Laterality: Right;   CHOLECYSTECTOMY  12/2007   COLONOSCOPY  12/22/2010   RBVQ:XIHWTU cecal adenomatous polyp   COLONOSCOPY WITH PROPOFOL N/A 03/31/2016   Dr. FOneida Alar four sessile polyps rectum/sigmoid colon, diverticulosi, ext/int hemorrhoids. hyperplastic polyps, next tcs 5 years.  COLONOSCOPY WITH PROPOFOL N/A 04/21/2021   anal fissure, non-bleeding internal hemorrhoids, right colon diverticulosis, one 2 mm polyp in ascending, three 4-6 mm polyps in descending colon. Tubular adenomas. 5 year surveillance.   complete hysterectomy     ENTEROSCOPY N/A 03/14/2013   XNT:ZGYF gastritis/ulcers has healed   ESOPHAGOGASTRODUODENOSCOPY  09/22/2011   VCB:SWHQ gastritis/Duodenitis   EXCISIONAL HEMORRHOIDECTOMY     FLEXIBLE SIGMOIDOSCOPY N/A 03/14/2013   SLF:3 colon polyp removed/moderate sized internal hemorrhoids   FLEXIBLE SIGMOIDOSCOPY N/A 06/09/2016   Procedure: FLEXIBLE SIGMOIDOSCOPY;  Surgeon: Danie Binder, MD;  [facility];  Service: Endoscopy;  Laterality: N/A;  rectal polyps times 2   FLEXIBLE SIGMOIDOSCOPY N/A  03/18/2018   Procedure: FLEXIBLE SIGMOIDOSCOPY;  Surgeon: Danie Binder, MD;  [facility];  Service: Endoscopy;  Laterality: N/A;  9:15am   FOOT SURGERY     bunion removal left   GIVENS CAPSULE STUDY N/A 02/10/2013   Procedure: GIVENS CAPSULE STUDY;  Surgeon: Danie Binder, MD;  [facility]: [facility];  Service: Endoscopy;  Laterality: N/A;  [place]     right    HEMORRHOID BANDING N/A 03/14/2013   Procedure: HEMORRHOID BANDING (Procedure #3)  3 bands applied PRF#[id number] Exp 02/02/2014 ;  Surgeon: Danie Binder, MD;  Location: [facility];  Service: Endoscopy;  Laterality: N/A;   HEMORRHOID SURGERY N/A 04/24/2016   Procedure: EXTENSIVE HEMORRHOIDECTOMY;  Surgeon: Vickie Epley, MD;  Location: [facility];  Service: General;  Laterality: N/A;   LAPAROSCOPY     adhesions   POLYPECTOMY N/A 03/14/2013   LDJ:TTSV Gastritis . ULCERS SEEN ON MAY 6 HAVE HEALED   POLYPECTOMY  03/31/2016   Procedure: POLYPECTOMY;  Surgeon: Danie Binder, MD;  [facility];  Service: Endoscopy;;  sigmoid colon polyps x2, rectal polyps x2   POLYPECTOMY  04/21/2021   Procedure: POLYPECTOMY;  Surgeon: Eloise Harman, DO;  [facility]: [facility];  Service: Endoscopy;;   RECTAL EXAM UNDER ANESTHESIA N/A 07/23/2021   Procedure: RECTAL EXAM UNDER ANESTHESIA;  Surgeon: Leighton Ruff, MD;  Location: [facility];  Service: General;  Laterality: N/A;   SPHINCTEROTOMY N/A 07/23/2021   Procedure: CHEMICAL SPHINCTEROTOMY;  Surgeon: Leighton Ruff, MD;  Location: [facility];  Service: General;  Laterality: N/A;   TONSILLECTOMY     TUBAL LIGATION      Family History  Problem Relation Age of Onset   Colon cancer Mother        81s   Urticaria Mother    Hypertension Mother    Cancer Mother    Cancer Father        oral   Crohn's disease Sister    Colon cancer Maternal Grandfather    Colon cancer Paternal Grandfather    Diabetes Brother    Colon  cancer Maternal Grandmother    Anesthesia problems Neg Hx     Social History   Socioeconomic History   Marital status: Widowed    Spouse name: Not on file   Number of children: Not on file   Years of education: Not on file   Highest education level: Not on file  Occupational History   Occupation: homemaker    Employer: UNEMPLOYED  Tobacco Use   Smoking status: Former    Packs/day: 0.50    Years: 30.00    Pack years: 15.00    Types: Cigarettes   Smokeless tobacco: Former    Quit date: 10/15/1996  Tobacco comments:    Stopped smoking ~ 2000  Vaping Use   Vaping Use: Never used  Substance and Sexual Activity   Alcohol use: Yes    Alcohol/week: 4.0 standard drinks    Types: 4 Glasses of wine per week   Drug use: No   Sexual activity: Yes    Birth control/protection: Surgical  Other Topics Concern   Not on file  Social History Narrative   Does not routinely exercise. Husband passed in JUN 2015 due to prostate ca.   Social Determinants of Health   Financial Resource Strain: Low Risk    Difficulty of Paying Living Expenses: Not hard at all  Food Insecurity: No Food Insecurity   Worried About Charity fundraiser in the Last Year: Never true   [place] in the Last Year: Never true  Transportation Needs: No Transportation Needs   Lack of Transportation (Medical): No   Lack of Transportation (Non-Medical): No  Physical Activity: Sufficiently Active   Days of Exercise per Week: 7 days   Minutes of Exercise per Session: 60 min  Stress: No Stress Concern Present   Feeling of Stress : Not at all  Social Connections: Socially Isolated   Frequency of Communication with Friends and Family: More than three times a week   Frequency of Social Gatherings with Friends and Family: Never   Attends Religious Services: Never   Marine scientist or Organizations: No   Attends Archivist Meetings: Never   Marital Status: Widowed  Human resources officer Violence: Not At  Risk   Fear of Current or Ex-Partner: No   Emotionally Abused: No   Physically Abused: No   Sexually Abused: No    Outpatient Medications Prior to Visit  Medication Sig Dispense Refill   albuterol (PROVENTIL) (2.5 MG/3ML) 0.083% nebulizer solution Take 3 mLs (2.5 mg total) by nebulization every 4 (four) hours as needed for wheezing or shortness of breath. 150 mL 3   albuterol (VENTOLIN HFA) 108 (90 Base) MCG/ACT inhaler Inhale 2 puffs into the lungs every 4 (four) hours as needed for wheezing. 54 g 3   ALPRAZolam (XANAX) 1 MG tablet Take 1 tablet (1 mg total) by mouth 2 (two) times daily as needed for anxiety. (Patient taking differently: Take 0.5-1 mg by mouth at bedtime.) 180 tablet 3   brimonidine (ALPHAGAN) 0.2 % ophthalmic solution Place 1 drop into both eyes 2 (two) times daily. 15 mL 3   budesonide-formoterol (SYMBICORT) 160-4.5 MCG/ACT inhaler Inhale 2 puffs into the lungs as needed. (Patient taking differently: Inhale 2 puffs into the lungs daily as needed (Asthma).) 10.2 g 3   chlorpheniramine-HYDROcodone (TUSSIONEX PENNKINETIC ER) 10-8 MG/5ML SUER Take 5 mLs by mouth every 12 (twelve) hours as needed. (Patient taking differently: Take 5 mLs by mouth every 12 (twelve) hours as needed for cough.) 140 mL 0   Cholecalciferol (VITAMIN D3) 50 MCG (2000 UT) TABS Take 2,000 Units by mouth daily.     cyclobenzaprine (FLEXERIL) 10 MG tablet TAKE ONE TABLET BY MOUTH DAILY  as needed muscle spasms (Patient taking differently: Take 10 mg by mouth daily as needed (Arthritis and torn rotator cuff left).) 90 tablet 3   dicyclomine (BENTYL) 10 MG capsule Take one capsule before meals and at bedtime AS NEEDED for abdominal pain and frequent stool. HOLD for constipation (Patient taking differently: Take 10 mg by mouth daily as needed (IBS).) 120 capsule 1   DUREZOL 0.05 % EMUL Place 1 drop into  the right eye daily.     estradiol (ESTRACE VAGINAL) 0.1 MG/GM vaginal cream Place 1 Applicatorful vaginally  daily as needed. (Patient taking differently: Place 1 Applicatorful vaginally daily as needed (Dryness).) 42.5 g 3   fenofibrate (TRICOR) 145 MG tablet Take 1 tablet (145 mg total) by mouth daily. (Patient taking differently: Take 145 mg by mouth at bedtime.) 90 tablet 3   hyoscyamine (ANASPAZ) 0.125 MG TBDP disintergrating tablet Place 1 tablet (0.125 mg total) under the tongue daily as needed (IBS). Place 0.125 mg under the tongue daily as needed (IBS). 30 tablet 0   irbesartan (AVAPRO) 150 MG tablet Take 0.5 tablets (75 mg total) by mouth daily. 45 tablet 3   latanoprost (XALATAN) 0.005 % ophthalmic solution Place 1 drop into both eyes at bedtime. 9 mL 3   levothyroxine (SYNTHROID) 25 MCG tablet Take 1 tablet (25 mcg total) by mouth daily before breakfast. 95 tablet 1   Lidocaine-Hydrocortisone Ace 3-2.5 % KIT APPLY TO RECTUM QID for 2 weeks then AS NEEDED FOR RECTAL PAIN OR BLEEDING 1 kit 11   Lifitegrast (XIIDRA) 5 % SOLN INSTILL 1 DROP IN BOTH EYES EVERY DAY AS NEEDED (EACH CONTAINER SHOULD YIELD 2 DROPS) STORE IN ORIGINAL FOIL POUCH (Patient taking differently: Place 1 drop into both eyes daily. (EACH CONTAINER SHOULD YIELD 2 DROPS) STORE IN ORIGINAL FOIL POUCH) 60 each 3   Magnesium 500 MG TABS Take 1 tablet by mouth daily.     meclizine (ANTIVERT) 12.5 MG tablet Take 1 tablet (12.5 mg total) by mouth 3 (three) times daily as needed for dizziness. (Patient taking differently: Take 12.5 mg by mouth 3 (three) times daily as needed (vertigo).) 30 tablet 3   Menthol, Topical Analgesic, (BIOFREEZE EX) Apply 1 application topically daily as needed (pain).     metroNIDAZOLE (METROCREAM) 0.75 % cream Apply 1 application topically as needed. (Patient taking differently: Apply 1 application topically daily as needed (Rash on face).) 45 g 3   neomycin-polymyxin b-dexamethasone (MAXITROL) 3.5-10000-0.1 OINT Place 1 application into both eyes at bedtime as needed. (Patient taking differently: Place 1  application into both eyes at bedtime as needed (Dry eyes).) 10.5 g 3   nitroGLYCERIN (NITROLINGUAL) 0.4 MG/SPRAY spray Place 1 spray under the tongue every 5 (five) minutes as needed for chest pain. 12 g 3   NON FORMULARY Take 1 tablet by mouth daily as needed (Stomach Bloat). FD GUARD     ondansetron (ZOFRAN) 4 MG tablet Take 1 tablet (4 mg total) by mouth every 8 (eight) hours as needed for nausea or vomiting. 30 tablet 1   Probiotic Product (PROBIOTIC DAILY PO) Take 1 capsule by mouth daily.     senna (SENOKOT) 8.6 MG TABS tablet Take 1 tablet by mouth daily.     UNABLE TO FIND Place 1 application rectally daily as needed. [place] Name: lidocaine 5% pramoxine 1% ointment per rectum as needed.     zolpidem (AMBIEN) 10 MG tablet TAKE ONE TABLET BY MOUTH DAILY AT BEDTIME. 90 tablet 3   naproxen (NAPROSYN) 500 MG tablet Take 500 mg by mouth daily as needed for moderate pain or mild pain.     traMADol (ULTRAM) 50 MG tablet Take 1 tablet (50 mg total) by mouth every 6 (six) hours as needed. 10 tablet 0   prednisoLONE acetate (PRED FORTE) 1 % ophthalmic suspension Place 1 drop into the right eye 3 (three) times daily.     No facility-administered medications prior to visit.  Allergies  Allergen Reactions   Nutmeg Oil (Myristica Oil) Anaphylaxis    Nut oil- skin irritation. Nut oil- skin irritation.   Adhesive [Tape] Other (See Comments)    Blisters skin   Aspirin Other (See Comments)    sickle cell trait--  Not recommended unless emergency   Imitrex [Sumatriptan] Hives   Keflex [Cephalexin] Other (See Comments)    G6PD   Levaquin [Levofloxacin In D5w]     Altered mental status Affects G6PD   Other     Nut Oil- skin irritation    Statins Other (See Comments)    Myopathy - elevated CK Myopathy - elevated CK Myopathy-elevated CK   Sulfa Antibiotics Other (See Comments)    Patient has sickle cell trait Sickle cell trait.   Sulfonamide Derivatives Other (See  Comments)    Patient has sickle cell trait   Latex Rash    Review of Systems  Constitutional:  Negative for chills and fever.  HENT:  Negative for congestion, sinus pressure, sinus pain and sore throat.   Eyes:  Positive for photophobia, pain and visual disturbance. Negative for discharge.  Respiratory:  Negative for cough and shortness of breath.   Cardiovascular:  Negative for chest pain and palpitations.  Gastrointestinal:  Positive for constipation. Negative for abdominal pain, blood in stool, diarrhea, nausea and vomiting.  Endocrine: Negative for polydipsia and polyuria.  Genitourinary:  Negative for dysuria and hematuria.  Musculoskeletal:  Positive for back pain. Negative for neck pain and neck stiffness.  Skin:  Negative for rash.  Neurological:  Positive for headaches. Negative for dizziness and weakness.  Psychiatric/Behavioral:  Negative for agitation and behavioral problems.       Objective:    Physical Exam Vitals reviewed.  Constitutional:      General: She is not in acute distress.    Appearance: She is not diaphoretic.  HENT:     Head: Normocephalic and atraumatic.     Nose: Nose normal.     Mouth/Throat:     Mouth: Mucous membranes are moist.  Eyes:     General: No scleral icterus.    Extraocular Movements: Extraocular movements intact.  Cardiovascular:     Rate and Rhythm: Normal rate and regular rhythm.     Pulses: Normal pulses.     Heart sounds: Normal heart sounds. No murmur heard. Pulmonary:     Breath sounds: Normal breath sounds. No wheezing or rales.  Musculoskeletal:        General: Tenderness (Lower lumbar spine area) present.     Cervical back: Neck supple. No tenderness.     Right lower leg: No edema.     Left lower leg: No edema.  Skin:    General: Skin is warm.     Findings: No rash.  Neurological:     General: No focal deficit present.     Mental Status: She is alert and oriented to person, place, and time.     Sensory: No sensory  deficit.     Motor: No weakness.  Psychiatric:        Mood and Affect: Mood normal.        Behavior: Behavior normal.    BP 122/86 (BP Location: Right Arm, Patient Position: Sitting, Cuff Size: Normal)    Pulse 94    Resp 18    Ht 5' 7"  (1.702 m)    Wt 216 lb 1.3 oz (98 kg)    SpO2 98%    BMI 33.84 kg/m  Wt Readings  from Last 3 Encounters:  11/17/21 216 lb 1.3 oz (98 kg)  10/23/21 215 lb 9.6 oz (97.8 kg)  10/20/21 215 lb (97.5 kg)        Assessment & Plan:   Problem List Items Addressed This Visit       Musculoskeletal and Integument   DDD (degenerative disc disease), lumbosacral    Refilled Norco for now Avoid heavy lifting and frequent bending Advised to use back brace and/or heating pad as needed      Relevant Medications   HYDROcodone-acetaminophen (NORCO/VICODIN) 5-325 MG tablet   naproxen (NAPROSYN) 500 MG tablet     Other   Spinal stenosis at L4-L5 level - Primary    Chronic low back pain likely due to spinal stenosis and DDD of lumbar spine Toradol given in the office today Naproxen as needed for now Norco as needed for severe pain Unable to give steroids as she is on steroid eyedrops and was told to avoid oral steroids by her ophthalmologist Denied spine surgeon referral for now      Relevant Medications   HYDROcodone-acetaminophen (NORCO/VICODIN) 5-325 MG tablet   naproxen (NAPROSYN) 500 MG tablet     Meds ordered this encounter  Medications   HYDROcodone-acetaminophen (NORCO/VICODIN) 5-325 MG tablet    Sig: Take 1 tablet by mouth every 6 (six) hours as needed for moderate pain.    Dispense:  30 tablet    Refill:  0   naproxen (NAPROSYN) 500 MG tablet    Sig: Take 1 tablet (500 mg total) by mouth daily as needed for moderate pain or mild pain.    Dispense:  30 tablet    Refill:  0   ketorolac (TORADOL) injection 60 mg     Lindell Spar, MD

## 2021-11-17 NOTE — Assessment & Plan Note (Addendum)
Chronic low back pain likely due to spinal stenosis and DDD of lumbar spine Toradol given in the office today Naproxen as needed for now [place] as needed for severe pain Unable to give steroids as she is on steroid eyedrops and was told to avoid oral steroids by her ophthalmologist Denied spine surgeon referral for now

## 2021-11-18 ENCOUNTER — Ambulatory Visit (INDEPENDENT_AMBULATORY_CARE_PROVIDER_SITE_OTHER): Payer: Medicare Other

## 2021-11-18 DIAGNOSIS — M79604 Pain in right leg: Secondary | ICD-10-CM

## 2021-11-25 ENCOUNTER — Encounter: Payer: Self-pay | Admitting: Internal Medicine

## 2021-11-25 ENCOUNTER — Telehealth: Payer: Self-pay | Admitting: Internal Medicine

## 2021-11-25 NOTE — Telephone Encounter (Signed)
Hey Dr. Lorenso Courier,   Inbound call from patient would like to transfer care to you. She is a previous patient of [facility] and does not believe she is in the best care. Patient have a history of diarrhea, blood in stool, and anal fissure. She did have a surgery in 08/2021. Her records are in [place]. Could you please review and advise on scheduling?  Thank you

## 2021-11-27 ENCOUNTER — Ambulatory Visit (INDEPENDENT_AMBULATORY_CARE_PROVIDER_SITE_OTHER): Payer: Medicare Other | Admitting: Orthopaedic Surgery

## 2021-11-27 ENCOUNTER — Encounter: Payer: Self-pay | Admitting: Orthopaedic Surgery

## 2021-11-27 ENCOUNTER — Other Ambulatory Visit: Payer: Self-pay

## 2021-11-27 ENCOUNTER — Ambulatory Visit: Payer: Medicare Other

## 2021-11-27 VITALS — BP 118/78 | HR 89 | Ht 67.0 in | Wt 218.0 lb

## 2021-11-27 DIAGNOSIS — M25511 Pain in right shoulder: Secondary | ICD-10-CM

## 2021-11-27 DIAGNOSIS — R531 Weakness: Secondary | ICD-10-CM | POA: Diagnosis not present

## 2021-11-27 NOTE — Progress Notes (Addendum)
Subjective:    Patient ID: [name], female    DOB: 19-Aug-1951, 71 y.o.   MRN: [id number]  HPI She has a long complicated history.  I saw her in 12-28-16 for left shoulder pain which MRI showed rotator cuff tear.  That got better on its own with home therapy.  In 28-Dec-2017 she fell and hit her head with loss of consciousness.   She was evaluated and told no acute findings back then.  However, she developed right sided weakness of right arm and leg.  The weakness has not resolved. She has not sought medical help for this.  She has "put up with it" over the years. She has limitations because of the weakness.  About two weeks ago she began experiencing deep pain of the right shoulder with motion.  The arm has been weak but now she has little motion in it without pain.  She has no swelling, no redness.  The arm had no trauma, no untoward event.  She has pain moving it and sleeping on it.  It is not getting any better. She is on multiple medications including Naprosyn and Norco but she does not take them regularly.  I am concerned about the persistent weakness on the right side and would like her to see a neurologist.  She has seen Dr. Jannifer Franklin in the distant past.  I will have her go to that practice.  She agrees.  She uses ice to the right shoulder which helps the most.     Review of Systems  Constitutional:  Positive for activity change.  Respiratory:  Positive for shortness of breath.   Musculoskeletal:  Positive for arthralgias and myalgias.  Neurological:  Positive for weakness and numbness.       Right sided weakness since injury in 2017-12-28   Psychiatric/Behavioral:  The patient is nervous/anxious.   All other systems reviewed and are negative. For Review of Systems, all other systems reviewed and are negative.  The following is a summary of the past history medically, past history surgically, known current medicines, social history and family history.  This information is gathered  electronically by the computer from prior information and documentation.  I review this each visit and have found including this information at this point in the chart is beneficial and informative.   Past Medical History:  Diagnosis Date   Anxiety    Arthritis    Asthma    Cataract    Phreesia 01/05/2021   Chest pain    a. 02/2000 Cath: nl cors, EF 60%;  b. 10/2010 MV: nl LV, no ischemia/infarct;  c. 03/2014 Admit c/p, r/o->grief from husbands death.   Chronic RUQ pain December 29, 2007   EUS slightly dilated CBD (7.40m), otherwise nl   Depression    Eczema    Exposure to chemical inhalation mid 1970s   Fatty liver    G6PD deficiency    GERD (gastroesophageal reflux disease)    Glaucoma    History of pulmonary embolus (PE)    Treated with Eliquis 2March 25, 2016  HTN (hypertension)    Hypercholesteremia    a. intolerant to statins.   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 She has limited vision in the right eye.   Ocular migraine    Palpitations    Pneumonia 07/16/2021   Patient was exposed to a harsh chemical in  the 1970's that created scar tissue in her lungs. She has had pneumonia several times in the past.   PONV (postoperative nausea and vomiting)    Recurrent upper respiratory infection (URI)    Renal cyst    Sickle cell trait (HCC)    Tubular adenoma of colon 09/17/2011   Urticaria     Past Surgical History:  Procedure Laterality Date   ABDOMINAL HYSTERECTOMY     ADENOIDECTOMY     ANGIOPLASTY     APPENDECTOMY     BIOPSY N/A 03/14/2013   Procedure: SMALL BOWEL AND GASTRIC BIOPSIES (Procedure #1);  Surgeon: Danie Binder, MD;  Location: [facility];  Service: Endoscopy;  Laterality: N/A;   CARDIAC CATHETERIZATION Left 02/11/2000   CATARACT EXTRACTION Left    CATARACT EXTRACTION W/PHACO Right 03/19/2021   Procedure: CATARACT EXTRACTION PHACO AND INTRAOCULAR LENS PLACEMENT ([place]) RIGHT 6.75 00:50.4;   Surgeon: Leandrew Koyanagi, MD;  Location: [facility];  Service: Ophthalmology;  Laterality: Right;   CHOLECYSTECTOMY  12/2007   COLONOSCOPY  12/22/2010   YCX:KGYJEH, cecal adenomatous polyp   COLONOSCOPY WITH PROPOFOL N/A 03/31/2016   Dr. Oneida Alar: four sessile polyps rectum/sigmoid colon, diverticulosi, ext/int hemorrhoids. hyperplastic polyps, next tcs 5 years.   COLONOSCOPY WITH PROPOFOL N/A 04/21/2021   anal fissure, non-bleeding internal hemorrhoids, right colon diverticulosis, one 2 mm polyp in ascending, three 4-6 mm polyps in descending colon. Tubular adenomas. 5 year surveillance.   complete hysterectomy     ENTEROSCOPY N/A 03/14/2013   UDJ:SHFW gastritis/ulcers has healed   ESOPHAGOGASTRODUODENOSCOPY  09/22/2011   YOV:ZCHY gastritis/Duodenitis   EXCISIONAL HEMORRHOIDECTOMY     FLEXIBLE SIGMOIDOSCOPY N/A 03/14/2013   SLF:3 colon polyp removed/moderate sized internal hemorrhoids   FLEXIBLE SIGMOIDOSCOPY N/A 06/09/2016   Procedure: FLEXIBLE SIGMOIDOSCOPY;  Surgeon: Danie Binder, MD;  [facility];  Service: Endoscopy;  Laterality: N/A;  rectal polyps times 2   FLEXIBLE SIGMOIDOSCOPY N/A 03/18/2018   Procedure: FLEXIBLE SIGMOIDOSCOPY;  Surgeon: Danie Binder, MD;  [facility];  Service: Endoscopy;  Laterality: N/A;  9:15am   FOOT SURGERY     bunion removal left   GIVENS CAPSULE STUDY N/A 02/10/2013   Procedure: GIVENS CAPSULE STUDY;  Surgeon: Danie Binder, MD;  [facility];  Service: Endoscopy;  Laterality: N/A;  [place]     right    HEMORRHOID BANDING N/A 03/14/2013   Procedure: HEMORRHOID BANDING (Procedure #3)  3 bands applied IFO#[id number] Exp 02/02/2014 ;  Surgeon: Danie Binder, MD;  Location: [facility];  Service: Endoscopy;  Laterality: N/A;   HEMORRHOID SURGERY N/A 04/24/2016   Procedure: EXTENSIVE HEMORRHOIDECTOMY;  Surgeon: Vickie Epley, MD;  Location: [facility];  Service: General;  Laterality: N/A;    LAPAROSCOPY     adhesions   POLYPECTOMY N/A 03/14/2013   OM[facility] Gastritis . ULCERS SEEN ON MAY 6 HAVE HEALED   POLYPECTOMY  03/31/2016   Procedure: POLYPECTOMY;  Surgeon: Danie Binder, MD;  [facility];  Service: Endoscopy;;  sigmoid colon polyps x2, rectal polyps x2   POLYPECTOMY  04/21/2021   Procedure: POLYPECTOMY;  Surgeon: Eloise Harman, DO;  [facility];  Service: Endoscopy;;   RECTAL EXAM UNDER ANESTHESIA N/A 07/23/2021   Procedure: RECTAL EXAM UNDER ANESTHESIA;  Surgeon: Leighton Ruff, MD;  Location: [facility];  Service: General;  Laterality: N/A;   SPHINCTEROTOMY N/A 07/23/2021   Procedure: CHEMICAL SPHINCTEROTOMY;  Surgeon: Leighton Ruff, MD;  Location: [place];  Service: General;  Laterality: N/A;   TONSILLECTOMY     TUBAL LIGATION      Current Outpatient Medications on File Prior to Visit  Medication Sig Dispense Refill   albuterol (PROVENTIL) (2.5 MG/3ML) 0.083% nebulizer solution Take 3 mLs (2.5 mg total) by nebulization every 4 (four) hours as needed for wheezing or shortness of breath. 150 mL 3   albuterol (VENTOLIN HFA) 108 (90 Base) MCG/ACT inhaler Inhale 2 puffs into the lungs every 4 (four) hours as needed for wheezing. 54 g 3   ALPRAZolam (XANAX) 1 MG tablet Take 1 tablet (1 mg total) by mouth 2 (two) times daily as needed for anxiety. (Patient taking differently: Take 0.5-1 mg by mouth at bedtime.) 180 tablet 3   brimonidine (ALPHAGAN) 0.2 % ophthalmic solution Place 1 drop into both eyes 2 (two) times daily. 15 mL 3   budesonide-formoterol (SYMBICORT) 160-4.5 MCG/ACT inhaler Inhale 2 puffs into the lungs as needed. (Patient taking differently: Inhale 2 puffs into the lungs daily as needed (Asthma).) 10.2 g 3   chlorpheniramine-HYDROcodone (TUSSIONEX PENNKINETIC ER) 10-8 MG/5ML SUER Take 5 mLs by mouth every 12 (twelve) hours as needed. (Patient taking differently: Take 5 mLs by mouth every 12 (twelve)  hours as needed for cough.) 140 mL 0   Cholecalciferol (VITAMIN D3) 50 MCG (2000 UT) TABS Take 2,000 Units by mouth daily.     cyclobenzaprine (FLEXERIL) 10 MG tablet TAKE ONE TABLET BY MOUTH DAILY  as needed muscle spasms (Patient taking differently: Take 10 mg by mouth daily as needed (Arthritis and torn rotator cuff left).) 90 tablet 3   dicyclomine (BENTYL) 10 MG capsule Take one capsule before meals and at bedtime AS NEEDED for abdominal pain and frequent stool. HOLD for constipation (Patient taking differently: Take 10 mg by mouth daily as needed (IBS).) 120 capsule 1   DUREZOL 0.05 % EMUL Place 1 drop into the right eye daily.     estradiol (ESTRACE VAGINAL) 0.1 MG/GM vaginal cream Place 1 Applicatorful vaginally daily as needed. (Patient taking differently: Place 1 Applicatorful vaginally daily as needed (Dryness).) 42.5 g 3   fenofibrate (TRICOR) 145 MG tablet Take 1 tablet (145 mg total) by mouth daily. (Patient taking differently: Take 145 mg by mouth at bedtime.) 90 tablet 3   HYDROcodone-acetaminophen (NORCO/VICODIN) 5-325 MG tablet Take 1 tablet by mouth every 6 (six) hours as needed for moderate pain. 30 tablet 0   hyoscyamine (ANASPAZ) 0.125 MG TBDP disintergrating tablet Place 1 tablet (0.125 mg total) under the tongue daily as needed (IBS). Place 0.125 mg under the tongue daily as needed (IBS). 30 tablet 0   irbesartan (AVAPRO) 150 MG tablet Take 0.5 tablets (75 mg total) by mouth daily. 45 tablet 3   latanoprost (XALATAN) 0.005 % ophthalmic solution Place 1 drop into both eyes at bedtime. 9 mL 3   levothyroxine (SYNTHROID) 25 MCG tablet Take 1 tablet (25 mcg total) by mouth daily before breakfast. 95 tablet 1   Lidocaine-Hydrocortisone Ace 3-2.5 % KIT APPLY TO RECTUM QID for 2 weeks then AS NEEDED FOR RECTAL PAIN OR BLEEDING 1 kit 11   Lifitegrast (XIIDRA) 5 % SOLN INSTILL 1 DROP IN BOTH EYES EVERY DAY AS NEEDED (EACH CONTAINER SHOULD YIELD 2 DROPS) STORE IN ORIGINAL FOIL POUCH  (Patient taking differently: Place 1 drop into both eyes daily. (EACH CONTAINER SHOULD YIELD 2 DROPS) STORE IN ORIGINAL FOIL POUCH) 60 each 3   Magnesium 500 MG TABS Take  1 tablet by mouth daily.     meclizine (ANTIVERT) 12.5 MG tablet Take 1 tablet (12.5 mg total) by mouth 3 (three) times daily as needed for dizziness. (Patient taking differently: Take 12.5 mg by mouth 3 (three) times daily as needed (vertigo).) 30 tablet 3   Menthol, Topical Analgesic, (BIOFREEZE EX) Apply 1 application topically daily as needed (pain).     metroNIDAZOLE (METROCREAM) 0.75 % cream Apply 1 application topically as needed. (Patient taking differently: Apply 1 application topically daily as needed (Rash on face).) 45 g 3   naproxen (NAPROSYN) 500 MG tablet Take 1 tablet (500 mg total) by mouth daily as needed for moderate pain or mild pain. 30 tablet 0   neomycin-polymyxin b-dexamethasone (MAXITROL) 3.5-10000-0.1 OINT Place 1 application into both eyes at bedtime as needed. (Patient taking differently: Place 1 application into both eyes at bedtime as needed (Dry eyes).) 10.5 g 3   nitroGLYCERIN (NITROLINGUAL) 0.4 MG/SPRAY spray Place 1 spray under the tongue every 5 (five) minutes as needed for chest pain. 12 g 3   NON FORMULARY Take 1 tablet by mouth daily as needed (Stomach Bloat). FD GUARD     ondansetron (ZOFRAN) 4 MG tablet Take 1 tablet (4 mg total) by mouth every 8 (eight) hours as needed for nausea or vomiting. 30 tablet 1   prednisoLONE acetate (PRED FORTE) 1 % ophthalmic suspension Place 1 drop into the right eye 3 (three) times daily.     Probiotic Product (PROBIOTIC DAILY PO) Take 1 capsule by mouth daily.     senna (SENOKOT) 8.6 MG TABS tablet Take 1 tablet by mouth daily.     UNABLE TO FIND Place 1 application rectally daily as needed. [place] Name: lidocaine 5% pramoxine 1% ointment per rectum as needed.     zolpidem (AMBIEN) 10 MG tablet TAKE ONE TABLET BY MOUTH DAILY AT BEDTIME. 90  tablet 3   No current facility-administered medications on file prior to visit.    Social History   Socioeconomic History   Marital status: Widowed    Spouse name: Not on file   Number of children: Not on file   Years of education: Not on file   Highest education level: Not on file  Occupational History   Occupation: homemaker    Employer: UNEMPLOYED  Tobacco Use   Smoking status: Former    Packs/day: 0.50    Years: 30.00    Pack years: 15.00    Types: Cigarettes   Smokeless tobacco: Former    Quit date: 10/15/1996   Tobacco comments:    Stopped smoking ~ 2000  Vaping Use   Vaping Use: Never used  Substance and Sexual Activity   Alcohol use: Yes    Alcohol/week: 4.0 standard drinks    Types: 4 Glasses of wine per week   Drug use: No   Sexual activity: Yes    Birth control/protection: Surgical  Other Topics Concern   Not on file  Social History Narrative   Does not routinely exercise. Husband passed in JUN 2015 due to prostate ca.   Social Determinants of Health   Financial Resource Strain: Low Risk    Difficulty of Paying Living Expenses: Not hard at all  Food Insecurity: No Food Insecurity   Worried About Charity fundraiser in the Last Year: Never true   [place] in the Last Year: Never true  Transportation Needs: No Transportation Needs   Lack of Transportation (Medical): No  Lack of Transportation (Non-Medical): No  Physical Activity: Sufficiently Active   Days of Exercise per Week: 7 days   Minutes of Exercise per Session: 60 min  Stress: No Stress Concern Present   Feeling of Stress : Not at all  Social Connections: Socially Isolated   Frequency of Communication with Friends and Family: More than three times a week   Frequency of Social Gatherings with Friends and Family: Never   Attends Religious Services: Never   Marine scientist or Organizations: No   Attends Archivist Meetings: Never   Marital Status: Widowed  Arboriculturist Violence: Not At Risk   Fear of Current or Ex-Partner: No   Emotionally Abused: No   Physically Abused: No   Sexually Abused: No    Family History  Problem Relation Age of Onset   Colon cancer Mother        58s   Urticaria Mother    Hypertension Mother    Cancer Mother    Cancer Father        oral   Crohn's disease Sister    Colon cancer Maternal Grandfather    Colon cancer Paternal Grandfather    Diabetes Brother    Colon cancer Maternal Grandmother    Anesthesia problems Neg Hx     BP 118/78    Pulse 89    Ht 5' 7"  (1.702 m)    Wt 218 lb (98.9 kg)    BMI 34.14 kg/m   Body mass index is 34.14 kg/m.     Objective:   Physical Exam Vitals and nursing note reviewed. Exam conducted with a chaperone present.  Constitutional:      Appearance: She is well-developed.  HENT:     Head: Normocephalic and atraumatic.  Eyes:     Conjunctiva/sclera: Conjunctivae normal.     Pupils: Pupils are equal, round, and reactive to light.  Cardiovascular:     Rate and Rhythm: Normal rate and regular rhythm.  Pulmonary:     Effort: Pulmonary effort is normal.  Abdominal:     Palpations: Abdomen is soft.  Musculoskeletal:       Arms:     Cervical back: Normal range of motion and neck supple.  Skin:    General: Skin is warm and dry.  Neurological:     Mental Status: She is alert and oriented to person, place, and time.     Cranial Nerves: No cranial nerve deficit.     Motor: No abnormal muscle tone.     Coordination: Coordination normal.     Deep Tendon Reflexes: Reflexes are normal and symmetric. Reflexes normal.  Psychiatric:        Behavior: Behavior normal.        Thought Content: Thought content normal.        Judgment: Judgment normal.  X-rays were done of the right shoulder, reported separately.        Assessment & Plan:   Encounter Diagnoses  Name Primary?   Acute pain of right shoulder Yes   Right sided weakness    PROCEDURE NOTE:  The patient  request injection, verbal consent was obtained.  The right shoulder was prepped appropriately after time out was performed.   Sterile technique was observed and injection of 1 cc of DepoMedrol 26m with several cc's of plain xylocaine. Anesthesia was provided by ethyl chloride and a 20-gauge needle was used to inject the shoulder area. A posterior approach was used.  The injection was tolerated  well.  A band aid dressing was applied.  The patient was advised to apply ice later today and tomorrow to the injection sight as needed.  Appointment made for neurology.  I will get MRI of the right shoulder.  Return in two weeks.  Call if any problem.  Precautions discussed.  Electronically Signed Sanjuana Kava, MD 2/23/20239:48 AM  A sling was given during this visit.  Electronically Signed Sanjuana Kava, MD 3/6/202310:47 PM

## 2021-12-02 NOTE — Telephone Encounter (Signed)
Spoke with pt will call back to reschedule

## 2021-12-03 ENCOUNTER — Telehealth: Payer: Self-pay

## 2021-12-03 ENCOUNTER — Encounter: Payer: Self-pay | Admitting: *Deleted

## 2021-12-03 ENCOUNTER — Ambulatory Visit (INDEPENDENT_AMBULATORY_CARE_PROVIDER_SITE_OTHER): Payer: Medicare Other | Admitting: *Deleted

## 2021-12-03 DIAGNOSIS — I10 Essential (primary) hypertension: Secondary | ICD-10-CM

## 2021-12-03 DIAGNOSIS — N182 Chronic kidney disease, stage 2 (mild): Secondary | ICD-10-CM

## 2021-12-03 NOTE — Chronic Care Management (AMB) (Signed)
Chronic Care Management   CCM RN Visit Note  12/03/2021 Name: [name] MRN: [id number] DOB: [birth year]  Subjective: [name] is a 71 y.o. year old female who is a primary care patient of Lindell Spar, MD. The care management team was consulted for assistance with disease management and care coordination needs.    Engaged with patient by telephone for follow up visit in response to provider referral for case management and/or care coordination services.   Consent to Services:  The patient was given information about Chronic Care Management services, agreed to services, and gave verbal consent prior to initiation of services.  Please see initial visit note for detailed documentation.   Patient agreed to services and verbal consent obtained.   Assessment: Review of patient past medical history, allergies, medications, health status, including review of consultants reports, laboratory and other test data, was performed as part of comprehensive evaluation and provision of chronic care management services.   SDOH (Social Determinants of Health) assessments and interventions performed:    CCM Care Plan  Allergies  Allergen Reactions   Nutmeg Oil (Myristica Oil) Anaphylaxis    Nut oil- skin irritation. Nut oil- skin irritation.   Adhesive [Tape] Other (See Comments)    Blisters skin   Aspirin Other (See Comments)    sickle cell trait--  Not recommended unless emergency   Imitrex [Sumatriptan] Hives   Keflex [Cephalexin] Other (See Comments)    G6PD   Levaquin [Levofloxacin In D5w]     Altered mental status Affects G6PD   Other     Nut Oil- skin irritation    Statins Other (See Comments)    Myopathy - elevated CK Myopathy - elevated CK Myopathy-elevated CK   Sulfa Antibiotics Other (See Comments)    Patient has sickle cell trait Sickle cell trait.   Sulfonamide Derivatives Other (See Comments)    Patient has sickle cell trait   Latex Rash    Outpatient  Encounter Medications as of 12/03/2021  Medication Sig   albuterol (PROVENTIL) (2.5 MG/3ML) 0.083% nebulizer solution Take 3 mLs (2.5 mg total) by nebulization every 4 (four) hours as needed for wheezing or shortness of breath.   albuterol (VENTOLIN HFA) 108 (90 Base) MCG/ACT inhaler Inhale 2 puffs into the lungs every 4 (four) hours as needed for wheezing.   ALPRAZolam (XANAX) 1 MG tablet Take 1 tablet (1 mg total) by mouth 2 (two) times daily as needed for anxiety. (Patient taking differently: Take 0.5-1 mg by mouth at bedtime.)   brimonidine (ALPHAGAN) 0.2 % ophthalmic solution Place 1 drop into both eyes 2 (two) times daily.   budesonide-formoterol (SYMBICORT) 160-4.5 MCG/ACT inhaler Inhale 2 puffs into the lungs as needed. (Patient taking differently: Inhale 2 puffs into the lungs daily as needed (Asthma).)   chlorpheniramine-HYDROcodone (TUSSIONEX PENNKINETIC ER) 10-8 MG/5ML SUER Take 5 mLs by mouth every 12 (twelve) hours as needed. (Patient taking differently: Take 5 mLs by mouth every 12 (twelve) hours as needed for cough.)   Cholecalciferol (VITAMIN D3) 50 MCG (2000 UT) TABS Take 2,000 Units by mouth daily.   cyclobenzaprine (FLEXERIL) 10 MG tablet TAKE ONE TABLET BY MOUTH DAILY  as needed muscle spasms (Patient taking differently: Take 10 mg by mouth daily as needed (Arthritis and torn rotator cuff left).)   dicyclomine (BENTYL) 10 MG capsule Take one capsule before meals and at bedtime AS NEEDED for abdominal pain and frequent stool. HOLD for constipation (Patient taking differently: Take 10 mg by mouth daily  as needed (IBS).)   DUREZOL 0.05 % EMUL Place 1 drop into the right eye daily.   estradiol (ESTRACE VAGINAL) 0.1 MG/GM vaginal cream Place 1 Applicatorful vaginally daily as needed. (Patient taking differently: Place 1 Applicatorful vaginally daily as needed (Dryness).)   fenofibrate (TRICOR) 145 MG tablet Take 1 tablet (145 mg total) by mouth daily. (Patient taking differently: Take 145  mg by mouth at bedtime.)   HYDROcodone-acetaminophen (NORCO/VICODIN) 5-325 MG tablet Take 1 tablet by mouth every 6 (six) hours as needed for moderate pain.   hyoscyamine (ANASPAZ) 0.125 MG TBDP disintergrating tablet Place 1 tablet (0.125 mg total) under the tongue daily as needed (IBS). Place 0.125 mg under the tongue daily as needed (IBS).   irbesartan (AVAPRO) 150 MG tablet Take 0.5 tablets (75 mg total) by mouth daily.   latanoprost (XALATAN) 0.005 % ophthalmic solution Place 1 drop into both eyes at bedtime.   levothyroxine (SYNTHROID) 25 MCG tablet Take 1 tablet (25 mcg total) by mouth daily before breakfast.   Lidocaine-Hydrocortisone Ace 3-2.5 % KIT APPLY TO RECTUM QID for 2 weeks then AS NEEDED FOR RECTAL PAIN OR BLEEDING   Lifitegrast (XIIDRA) 5 % SOLN INSTILL 1 DROP IN BOTH EYES EVERY DAY AS NEEDED (EACH CONTAINER SHOULD YIELD 2 DROPS) STORE IN ORIGINAL FOIL POUCH (Patient taking differently: Place 1 drop into both eyes daily. (EACH CONTAINER SHOULD YIELD 2 DROPS) STORE IN ORIGINAL FOIL POUCH)   Magnesium 500 MG TABS Take 1 tablet by mouth daily.   meclizine (ANTIVERT) 12.5 MG tablet Take 1 tablet (12.5 mg total) by mouth 3 (three) times daily as needed for dizziness. (Patient taking differently: Take 12.5 mg by mouth 3 (three) times daily as needed (vertigo).)   Menthol, Topical Analgesic, (BIOFREEZE EX) Apply 1 application topically daily as needed (pain).   metroNIDAZOLE (METROCREAM) 0.75 % cream Apply 1 application topically as needed. (Patient taking differently: Apply 1 application topically daily as needed (Rash on face).)   naproxen (NAPROSYN) 500 MG tablet Take 1 tablet (500 mg total) by mouth daily as needed for moderate pain or mild pain.   neomycin-polymyxin b-dexamethasone (MAXITROL) 3.5-10000-0.1 OINT Place 1 application into both eyes at bedtime as needed. (Patient taking differently: Place 1 application into both eyes at bedtime as needed (Dry eyes).)   nitroGLYCERIN  (NITROLINGUAL) 0.4 MG/SPRAY spray Place 1 spray under the tongue every 5 (five) minutes as needed for chest pain.   NON FORMULARY Take 1 tablet by mouth daily as needed (Stomach Bloat). FD GUARD   ondansetron (ZOFRAN) 4 MG tablet Take 1 tablet (4 mg total) by mouth every 8 (eight) hours as needed for nausea or vomiting.   prednisoLONE acetate (PRED FORTE) 1 % ophthalmic suspension Place 1 drop into the right eye 3 (three) times daily.   Probiotic Product (PROBIOTIC DAILY PO) Take 1 capsule by mouth daily.   senna (SENOKOT) 8.6 MG TABS tablet Take 1 tablet by mouth daily.   UNABLE TO FIND Place 1 application rectally daily as needed. [place] Name: lidocaine 5% pramoxine 1% ointment per rectum as needed.   zolpidem (AMBIEN) 10 MG tablet TAKE ONE TABLET BY MOUTH DAILY AT BEDTIME.   No facility-administered encounter medications on file as of 12/03/2021.    Patient Active Problem List   Diagnosis Date Noted   C. 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 04/02/2020   Primary open angle glaucoma (POAG) of both eyes 01/12/2020   Generalized OA 01/12/2020   Arthritis of carpometacarpal ([place]) joint of left thumb 09/20/2018   Injury of digital nerve of left little finger 09/20/2018   Chronic posterior anal fissure 09/03/2016   Rosacea 09/04/2015   Constipation    PE (pulmonary thromboembolism) ([place]) 07/26/2015   DDD (degenerative disc disease), lumbosacral 02/13/2015   Depression 02/12/2015   Vaginal atrophy 11/14/2014   Hearing loss 11/15/2013   Knee pain 09/05/2013   Lung nodule 08/14/2013   Nodule of tendon sheath 08/14/2013   Hemorrhoids, internal,  with bleeding 04/05/2013   Hyperlipidemia 34/19/3790   Nonalcoholic steatohepatitis (NASH) 02/28/2013   Essential hypertension, benign 02/07/2013   Class 1 obesity due to excess calories with serious comorbidity and body mass index (BMI) of 33.0 to 33.9 in adult 02/07/2013   GERD (gastroesophageal reflux disease) 02/07/2013   Rectal bleeding 11/02/2012   CKD (chronic kidney disease), stage II 09/21/2012   Asthma 08/25/2012   Incontinence overflow, stress female 08/25/2012   Anxiety 08/25/2012   Chronic insomnia 08/25/2012   Irritable bowel syndrome 12/10/2010    Conditions to be addressed/monitored:HTN and CKD Stage 2  Care Plan : RN care manager plan of care  Updates made by Kassie Mends, RN since 12/03/2021 12:00 AM     Problem: No plan of care established for management of chronic disease state  (HTN, CKD2, HLD)   Priority: High     Long-Range Goal: Development of plan of care for chronic disease management  (HTN, CKD2, HLD)   Start Date: 12/03/2021  Expected End Date: 06/01/2022  Priority: High  Note:   Current Barriers:  Knowledge Deficits related to plan of care for management of HTN and CKD stage 2  Ineffective Self Health Maintenance in a patient with CKD Stage 2.  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 Patient reports she has a Life Alert necklace that came with her security system but sometimes has trouble with picking up a signal so she does not routinely use.  Patient reports she does not qualify for medicaid and needs additional resources from [place] for transportation as [place] will be ending as of 12/29/21 and pt states she has no idea how  she is going to get to her appointments, (does not feel social work is in order at this time but may be in the future). Patient reports she would "like to get out of the house on a more regular basis". Unable to independently drive, pt reports she has never driven and relies on her neighbor/ friend, medications are delivered from [place] and uses [employer] for groceries, had been utilizing [facility] for provider appointments. Knowledge Deficits related to basic understanding of hypertension pathophysiology and self care management- patient checks blood pressure daily now reports her blood pressure has been good with most recent reading 118/78.  RNCM Clinical Goal(s):  Patient will verbalize understanding  of plan for management of HTN and CKD stage 2 as evidenced by patient report, review of EHR and  through collaboration with RN Care manager, provider, and care team.   Interventions: 1:1 collaboration with primary care provider regarding development and update of comprehensive plan of care as evidenced by provider attestation and co-signature Inter-disciplinary care team collaboration (see longitudinal plan of care) Evaluation of current treatment plan related to  self management and patient's adherence to plan as established by provider   Chronic Kidney Disease Interventions:  (Status:  Goal on track:  Yes.) Long Term Goal Evaluation of current treatment plan related to chronic kidney disease self management and patient's adherence to plan as established by provider      Reviewed medications with patient and discussed importance of compliance    Discussed complications of poorly controlled blood pressure such as heart disease, stroke, circulatory complications, vision complications, kidney impairment, sexual dysfunction    Discussed plans with patient for ongoing care management follow up and provided patient with direct contact information for care management team     Last practice recorded BP readings:  BP Readings from Last 3 Encounters:  11/27/21 118/78  11/17/21 122/86  10/23/21 114/68  Most recent eGFR/CrCl:  Lab Results  Component Value Date   EGFR 51 (L) 09/08/2021    No components found for: CRCL    Hypertension Interventions:  (Status:  Goal on track:  Yes.) Long Term Goal Last practice recorded BP readings:  BP Readings from Last 3 Encounters:  11/27/21 118/78  11/17/21 122/86  10/23/21 114/68  Most recent eGFR/CrCl:  Lab Results  Component Value Date   EGFR 51 (L) 09/08/2021    No components found for: CRCL  Reviewed medications with patient and discussed importance of compliance Counseled on the importance of exercise goals with target of 150 minutes per week Advised patient, providing education and rationale, to monitor blood pressure daily and record, calling PCP for findings outside established parameters Discussed complications of poorly controlled blood pressure such as heart disease, stroke, circulatory complications, vision complications, kidney impairment, sexual dysfunction  Referral sent Care Guide to outreach patient for transportation resources  Patient Goals/Self-Care Activities: Take medications as prescribed   Attend all scheduled provider appointments Call pharmacy for medication refills 3-7 days in advance of running out of medications Perform all self care activities independently  Perform IADL's (shopping, preparing meals, housekeeping, managing finances) independently check blood pressure daily write blood pressure results in a log or diary take blood pressure log to all doctor appointments call doctor for signs and symptoms of high blood pressure take medications for blood pressure exactly as prescribed eat more whole grains, fruits and vegetables, lean meats and healthy fats Expect a call from care guide for transportation resources       Plan:Telephone follow up appointment with care management  team member scheduled for:  02/25/22  Jacqlyn Larsen [facility], BSN RN Case Manager [facility] [phone]

## 2021-12-03 NOTE — Patient Instructions (Signed)
Visit Information ? ?Thank you for taking time to visit with me today. Please don't hesitate to contact me if I can be of assistance to you before our next scheduled telephone appointment. ? ?Following are the goals we discussed today:  ?Take medications as prescribed   ?Attend all scheduled provider appointments ?Call pharmacy for medication refills 3-7 days in advance of running out of medications ?Perform all self care activities independently  ?Perform IADL's (shopping, preparing meals, housekeeping, managing finances) independently ?check blood pressure daily ?write blood pressure results in a log or diary ?take blood pressure log to all doctor appointments ?call doctor for signs and symptoms of high blood pressure ?take medications for blood pressure exactly as prescribed ?eat more whole grains, fruits and vegetables, lean meats and healthy fats ?Expect a call from care guide for transportation resources ? ?Our next appointment is by telephone on 02/25/22 at 9 am ? ?Please call the care guide team at [phone] if you need to cancel or reschedule your appointment.  ? ?If you are experiencing a Mental Health or [place] or need someone to talk to, please call the Suicide and Crisis Lifeline: 988 ?call the [place] National Suicide Prevention Lifeline: 507 175 8938 or TTY: 606-005-6830 TTY (725)736-0057) to talk to a trained counselor ?call 1-800-273-TALK (toll free, 24 hour hotline) ?go to [facility] [address] [place] [phone]) ?call the [facility]: [phone] ?call 911  ? ?Patient verbalizes understanding of instructions and care plan provided today and agrees to view in [place]. Active MyChart status confirmed with patient.   ? ?Jacqlyn Larsen RNC, BSN ?RN Case Manager ?[facility] ?[phone] ? ?

## 2021-12-03 NOTE — Telephone Encounter (Signed)
? ?  Telephone encounter was:  Successful.  ?12/03/2021 ?Name: [name] MRN: [id number] DOB: 12-May-1951 ? ?[name] is a 71 y.o. year old female who is a primary care patient of Anabel Halon, MD . The community resource team was consulted for assistance with Transportation Needs  ? ?Care guide performed the following interventions: Patient provided with information about care guide support team and interviewed to confirm resource needs. ? ?Follow Up Plan:  Care guide will follow up with patient by phone over the next week ? ? ? ?Lenard Forth ?Care Guide, Embedded Care Coordination ?[facility], Care Management  ?[phone] ?[address] [place], [place], [place] [address] ?Phone: [phone] ?Email: [email or web address] ? ?  ?

## 2021-12-04 ENCOUNTER — Ambulatory Visit: Payer: Medicare Other | Admitting: Gastroenterology

## 2021-12-05 ENCOUNTER — Encounter: Payer: Self-pay | Admitting: Internal Medicine

## 2021-12-09 DIAGNOSIS — H35351 Cystoid macular degeneration, right eye: Secondary | ICD-10-CM | POA: Diagnosis not present

## 2021-12-10 ENCOUNTER — Ambulatory Visit (HOSPITAL_COMMUNITY): Payer: Medicare Other

## 2021-12-11 ENCOUNTER — Other Ambulatory Visit (HOSPITAL_COMMUNITY): Payer: Medicare Other

## 2021-12-11 ENCOUNTER — Ambulatory Visit: Payer: Medicare Other | Admitting: Orthopaedic Surgery

## 2021-12-12 ENCOUNTER — Other Ambulatory Visit: Payer: Self-pay

## 2021-12-12 ENCOUNTER — Ambulatory Visit (HOSPITAL_COMMUNITY)
Admission: RE | Admit: 2021-12-12 | Discharge: 2021-12-12 | Disposition: A | Discharge status: Home / Self Care | Payer: Medicare Other | Source: Ambulatory Visit | Attending: Orthopaedic Surgery | Admitting: Orthopaedic Surgery

## 2021-12-12 DIAGNOSIS — M25511 Pain in right shoulder: Secondary | ICD-10-CM | POA: Insufficient documentation

## 2021-12-16 ENCOUNTER — Ambulatory Visit (INDEPENDENT_AMBULATORY_CARE_PROVIDER_SITE_OTHER): Payer: Medicare Other | Admitting: Orthopaedic Surgery

## 2021-12-16 ENCOUNTER — Other Ambulatory Visit: Payer: Self-pay

## 2021-12-16 ENCOUNTER — Encounter: Payer: Self-pay | Admitting: Orthopaedic Surgery

## 2021-12-16 VITALS — Ht 67.0 in | Wt 218.0 lb

## 2021-12-16 DIAGNOSIS — M25511 Pain in right shoulder: Secondary | ICD-10-CM | POA: Diagnosis not present

## 2021-12-16 DIAGNOSIS — R531 Weakness: Secondary | ICD-10-CM

## 2021-12-16 NOTE — Progress Notes (Signed)
My shoulder is better. ? ?She had MRI of the right shoulder showing: ?IMPRESSION: ?1. Thin, high-grade partial-thickness tear extending perpendicular ?from the bursal tendon surface within the mid distance of the tendon ?footprint of the posterior and mid AP dimension of the supraspinatus ?tendon. Additional mild more distal bursal sided partial-thickness ?supraspinatus tendon tears. No significant tendon retraction. ?2. Tiny longitudinal thin linear partial-thickness tear of the ?anterior infraspinatus tendon footprint. ?3. Moderate superior subscapularis insertional tendinosis. ?4. Mild-to-moderate anterior supraspinatus muscle atrophy suggests ?at least a component of the tendon tears is chronic. ?5. Mild-to-moderate proximal long head of the biceps ?partial-thickness tearing. ?6. Moderate to severe degenerative changes of the acromioclavicular ?joint with mild distal lateral broad-based subacromial spurring and ?mild subacromial/subdeltoid bursitis. ? ?I have explained the findings to her. ? ?I have independently reviewed the MRI.   ? ?She is better with the right shoulder after the injection and doing the exercises.  I want her to continue this. ? ?Her right sided weakness is better also.  She has appointment to see neurologist later this week.  Although she is better, she still has problems with falling at times.  I would like a neurological exam.  She agrees. ? ?The right shoulder has full ROM today with pain in the extremes.  NV intact. ? ?Encounter Diagnoses  ?Name Primary?  ? Acute pain of right shoulder Yes  ? Right sided weakness   ? ?To see neurologist. ? ?Return to see me in three weeks. ? ?Call if any problem. ? ?Precautions discussed. ? ?Electronically Signed ?Sanjuana Kava, MD ?3/14/20239:13 AM ? ?

## 2021-12-17 ENCOUNTER — Ambulatory Visit (INDEPENDENT_AMBULATORY_CARE_PROVIDER_SITE_OTHER): Payer: Medicare Other | Admitting: Neurology

## 2021-12-17 ENCOUNTER — Telehealth: Payer: Self-pay | Admitting: Neurology

## 2021-12-17 ENCOUNTER — Encounter: Payer: Self-pay | Admitting: Neurology

## 2021-12-17 VITALS — BP 128/80 | HR 85 | Ht 67.0 in | Wt 218.0 lb

## 2021-12-17 DIAGNOSIS — M25511 Pain in right shoulder: Secondary | ICD-10-CM

## 2021-12-17 DIAGNOSIS — G43709 Chronic migraine without aura, not intractable, without status migrainosus: Secondary | ICD-10-CM | POA: Diagnosis not present

## 2021-12-17 DIAGNOSIS — M545 Low back pain, unspecified: Secondary | ICD-10-CM | POA: Diagnosis not present

## 2021-12-17 DIAGNOSIS — G8929 Other chronic pain: Secondary | ICD-10-CM | POA: Diagnosis not present

## 2021-12-17 MED ORDER — NURTEC 75 MG PO TBDP: 75.0000 mg | ORAL_TABLET | ORAL | 11 refills | Status: DC

## 2021-12-17 NOTE — Telephone Encounter (Signed)
[place]ic[place] no auth req order sent to [facility] to be scheduled at [facility]. They will reach out to the patient to schedule.  ?

## 2021-12-17 NOTE — Progress Notes (Signed)
[facility]  PATIENT: [name] DOB: 17-Feb-1951  REQUESTING CLINICIAN: Darreld Mclean, MD HISTORY FROM: Patient  REASON FOR VISIT: Right arm and leg weakness/Migraines/Confusion   HISTORICAL  CHIEF COMPLAINT:  Chief Complaint  Patient presents with   New Patient (Initial Visit)    Rm 12. Alone. NP internal referral for right sided weakness. Previously had fall. Had MRI of head from 2013. Rule out injury as probable cause. C/o difficulty with word retrieval and confusion. Depth perception difficulties.    HISTORY OF PRESENT ILLNESS:  This is a 71 year old woman with multiple medical conditions including chronic pain, hypertension, hyperlipidemia, glaucoma, hypothyroidism, migraine headaches who is presenting with complaint of right-sided weakness.  Patient reports history of right arm and right leg weakness. She presented to her orthopedist Dr. Hilda Lias and due to the complaint of right-sided arm and leg weakness, she was referred to Neurology.  At that same time she was also given a right shoulder ESI and had MRI of her shoulder.  MRI shoulder showed evidence of tears and arthritis and patient also reported that after the injection she felt much better.  At that time she did not have any neck pain.  Currently she reported the shoulder pain resolved, she is not experiencing neck pain, she is able to lift her right arm above her shoulder and this is a marked improvement.  For her right leg weakness pain, she attributed to chronic low back pain, she states that both legs sometimes do get weak. She denies any fall recently.    Patient also complained of migraine.  She reported a history of migraine, in the past she has tried Imitrex as abortive medication, as tired over-the-counter medications but lately her migraines have reappear.  Currently she is having 4 headaches days per week.  She is not on any abortive or preventive medication.  When she has the headaches, she  reports putting ice or wrap her head with something cold and going to sleep.   She does also note that sometimes she get confused, sometimes has word finding difficulty but she is able to hold conversation, she is still independent in all activities of daily living and all IADLs  She reports that she lives alone, she does not have any family here, her son is in [place]   OTHER MEDICAL CONDITIONS: Chronic pain, hypertension, glaucoma, hyperlipidemia, hypothyroidism    REVIEW OF SYSTEMS: Full 14 system review of systems performed and negative with exception of: as noted in the HPI   ALLERGIES: Allergies  Allergen Reactions   Nutmeg Oil (Myristica Oil) Anaphylaxis    Nut oil- skin irritation. Nut oil- skin irritation.   Adhesive [Tape] Other (See Comments)    Blisters skin   Aspirin Other (See Comments)    sickle cell trait--  Not recommended unless emergency   Imitrex [Sumatriptan] Hives   Keflex [Cephalexin] Other (See Comments)    G6PD   Levaquin [Levofloxacin In D5w]     Altered mental status Affects G6PD   Other     Nut Oil- skin irritation    Statins Other (See Comments)    Myopathy - elevated CK Myopathy - elevated CK Myopathy-elevated CK   Sulfa Antibiotics Other (See Comments)    Patient has sickle cell trait Sickle cell trait.   Sulfonamide Derivatives Other (See Comments)    Patient has sickle cell trait   Latex Rash    HOME MEDICATIONS: Outpatient Medications Prior to Visit  Medication Sig Dispense Refill  albuterol (PROVENTIL) (2.5 MG/3ML) 0.083% nebulizer solution Take 3 mLs (2.5 mg total) by nebulization every 4 (four) hours as needed for wheezing or shortness of breath. 150 mL 3   albuterol (VENTOLIN HFA) 108 (90 Base) MCG/ACT inhaler Inhale 2 puffs into the lungs every 4 (four) hours as needed for wheezing. 54 g 3   ALPRAZolam (XANAX) 1 MG tablet Take 1 tablet (1 mg total) by mouth 2 (two) times daily as needed for anxiety. (Patient taking differently:  Take 0.5-1 mg by mouth at bedtime.) 180 tablet 3   brimonidine (ALPHAGAN) 0.2 % ophthalmic solution Place 1 drop into both eyes 2 (two) times daily. 15 mL 3   budesonide-formoterol (SYMBICORT) 160-4.5 MCG/ACT inhaler Inhale 2 puffs into the lungs as needed. (Patient taking differently: Inhale 2 puffs into the lungs daily as needed (Asthma).) 10.2 g 3   chlorpheniramine-HYDROcodone (TUSSIONEX PENNKINETIC ER) 10-8 MG/5ML SUER Take 5 mLs by mouth every 12 (twelve) hours as needed. (Patient taking differently: Take 5 mLs by mouth every 12 (twelve) hours as needed for cough.) 140 mL 0   Cholecalciferol (VITAMIN D3) 50 MCG (2000 UT) TABS Take 2,000 Units by mouth daily.     cyclobenzaprine (FLEXERIL) 10 MG tablet TAKE ONE TABLET BY MOUTH DAILY  as needed muscle spasms (Patient taking differently: Take 10 mg by mouth daily as needed (Arthritis and torn rotator cuff left).) 90 tablet 3   dicyclomine (BENTYL) 10 MG capsule Take one capsule before meals and at bedtime AS NEEDED for abdominal pain and frequent stool. HOLD for constipation (Patient taking differently: Take 10 mg by mouth daily as needed (IBS).) 120 capsule 1   DUREZOL 0.05 % EMUL Place 1 drop into the right eye daily.     estradiol (ESTRACE VAGINAL) 0.1 MG/GM vaginal cream Place 1 Applicatorful vaginally daily as needed. (Patient taking differently: Place 1 Applicatorful vaginally daily as needed (Dryness).) 42.5 g 3   fenofibrate (TRICOR) 145 MG tablet Take 1 tablet (145 mg total) by mouth daily. (Patient taking differently: Take 145 mg by mouth at bedtime.) 90 tablet 3   HYDROcodone-acetaminophen (NORCO/VICODIN) 5-325 MG tablet Take 1 tablet by mouth every 6 (six) hours as needed for moderate pain. 30 tablet 0   hyoscyamine (ANASPAZ) 0.125 MG TBDP disintergrating tablet Place 1 tablet (0.125 mg total) under the tongue daily as needed (IBS). Place 0.125 mg under the tongue daily as needed (IBS). 30 tablet 0   irbesartan (AVAPRO) 150 MG tablet Take  0.5 tablets (75 mg total) by mouth daily. 45 tablet 3   latanoprost (XALATAN) 0.005 % ophthalmic solution Place 1 drop into both eyes at bedtime. 9 mL 3   levothyroxine (SYNTHROID) 25 MCG tablet Take 1 tablet (25 mcg total) by mouth daily before breakfast. 95 tablet 1   Lidocaine-Hydrocortisone Ace 3-2.5 % KIT APPLY TO RECTUM QID for 2 weeks then AS NEEDED FOR RECTAL PAIN OR BLEEDING 1 kit 11   Lifitegrast (XIIDRA) 5 % SOLN INSTILL 1 DROP IN BOTH EYES EVERY DAY AS NEEDED (EACH CONTAINER SHOULD YIELD 2 DROPS) STORE IN ORIGINAL FOIL POUCH (Patient taking differently: Place 1 drop into both eyes daily. (EACH CONTAINER SHOULD YIELD 2 DROPS) STORE IN ORIGINAL FOIL POUCH) 60 each 3   Magnesium 500 MG TABS Take 1 tablet by mouth daily.     meclizine (ANTIVERT) 12.5 MG tablet Take 1 tablet (12.5 mg total) by mouth 3 (three) times daily as needed for dizziness. (Patient taking differently: Take 12.5 mg by mouth 3 (three)  times daily as needed (vertigo).) 30 tablet 3   Menthol, Topical Analgesic, (BIOFREEZE EX) Apply 1 application topically daily as needed (pain).     metroNIDAZOLE (METROCREAM) 0.75 % cream Apply 1 application topically as needed. (Patient taking differently: Apply 1 application. topically daily as needed (Rash on face).) 45 g 3   naproxen (NAPROSYN) 500 MG tablet Take 1 tablet (500 mg total) by mouth daily as needed for moderate pain or mild pain. 30 tablet 0   neomycin-polymyxin b-dexamethasone (MAXITROL) 3.5-10000-0.1 OINT Place 1 application into both eyes at bedtime as needed. (Patient taking differently: Place 1 application. into both eyes at bedtime as needed (Dry eyes).) 10.5 g 3   nitroGLYCERIN (NITROLINGUAL) 0.4 MG/SPRAY spray Place 1 spray under the tongue every 5 (five) minutes as needed for chest pain. 12 g 3   NON FORMULARY Take 1 tablet by mouth daily as needed (Stomach Bloat). FD GUARD     ondansetron (ZOFRAN) 4 MG tablet Take 1 tablet (4 mg total) by mouth every 8 (eight) hours  as needed for nausea or vomiting. 30 tablet 1   prednisoLONE acetate (PRED FORTE) 1 % ophthalmic suspension Place 1 drop into the right eye 3 (three) times daily.     Probiotic Product (PROBIOTIC DAILY PO) Take 1 capsule by mouth daily.     senna (SENOKOT) 8.6 MG TABS tablet Take 1 tablet by mouth daily.     UNABLE TO FIND Place 1 application rectally daily as needed. [facility]  Med Name: lidocaine 5% pramoxine 1% ointment per rectum as needed.     zolpidem (AMBIEN) 10 MG tablet TAKE ONE TABLET BY MOUTH DAILY AT BEDTIME. 90 tablet 3   No facility-administered medications prior to visit.    PAST MEDICAL HISTORY: Past Medical History:  Diagnosis Date   Anxiety    Arthritis    Asthma    Cataract    Phreesia 01/05/2021   Chest pain    a. 02/2000 Cath: nl cors, EF 60%;  b. November 02, 2010 MV: nl LV, no ischemia/infarct;  c. 03/2014 Admit c/p, r/o->grief from husbands death.   Chronic RUQ pain Nov 03, 2007   EUS slightly dilated CBD (7.85mm), otherwise nl   Depression    Eczema    Exposure to chemical inhalation mid 1970s   Fatty liver    G6PD deficiency    GERD (gastroesophageal reflux disease)    Glaucoma    History of pulmonary embolus (PE)    Treated with Eliquis 11-02-14   HTN (hypertension)    Hypercholesteremia    a. intolerant to statins.   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 She has limited vision in the right eye.   Ocular migraine    Palpitations    Pneumonia 07/16/2021   Patient was exposed to a harsh chemical in the 1970's that created scar tissue in her lungs. She has had pneumonia several times in the past.   PONV (postoperative nausea and vomiting)    Recurrent upper respiratory infection (URI)    Renal cyst    Sickle cell trait (HCC)    Tubular adenoma of colon 09/17/2011   Urticaria     PAST SURGICAL HISTORY: Past Surgical History:  Procedure Laterality Date   ABDOMINAL  HYSTERECTOMY     ADENOIDECTOMY     ANGIOPLASTY     APPENDECTOMY     BIOPSY N/A 03/14/2013  Procedure: SMALL BOWEL AND GASTRIC BIOPSIES (Procedure #1);  Surgeon: West Bali, MD;  Location: [facility];  Service: Endoscopy;  Laterality: N/A;   CARDIAC CATHETERIZATION Left 02/11/2000   CATARACT EXTRACTION Left    CATARACT EXTRACTION W/PHACO Right 03/19/2021   Procedure: CATARACT EXTRACTION PHACO AND INTRAOCULAR LENS PLACEMENT (IOC) RIGHT 6.75 00:50.4;  Surgeon: Lockie Mola, MD;  Location: [facility];  Service: Ophthalmology;  Laterality: Right;   CHOLECYSTECTOMY  12/2007   COLONOSCOPY  12/22/2010   WGN:FAOZHY, cecal adenomatous polyp   COLONOSCOPY WITH PROPOFOL N/A 03/31/2016   Dr. Darrick Penna: four sessile polyps rectum/sigmoid colon, diverticulosi, ext/int hemorrhoids. hyperplastic polyps, next tcs 5 years.   COLONOSCOPY WITH PROPOFOL N/A 04/21/2021   anal fissure, non-bleeding internal hemorrhoids, right colon diverticulosis, one 2 mm polyp in ascending, three 4-6 mm polyps in descending colon. Tubular adenomas. 5 year surveillance.   complete hysterectomy     ENTEROSCOPY N/A 03/14/2013   QMV:HQIO gastritis/ulcers has healed   ESOPHAGOGASTRODUODENOSCOPY  09/22/2011   NGE:XBMW gastritis/Duodenitis   EXCISIONAL HEMORRHOIDECTOMY     FLEXIBLE SIGMOIDOSCOPY N/A 03/14/2013   SLF:3 colon polyp removed/moderate sized internal hemorrhoids   FLEXIBLE SIGMOIDOSCOPY N/A 06/09/2016   Procedure: FLEXIBLE SIGMOIDOSCOPY;  Surgeon: West Bali, MD;  Location: [facility];  Service: Endoscopy;  Laterality: N/A;  rectal polyps times 2   FLEXIBLE SIGMOIDOSCOPY N/A 03/18/2018   Procedure: FLEXIBLE SIGMOIDOSCOPY;  Surgeon: West Bali, MD;  Location: [facility];  Service: Endoscopy;  Laterality: N/A;  9:15am   FOOT SURGERY     bunion removal left   GIVENS CAPSULE STUDY N/A 02/10/2013   Procedure: GIVENS CAPSULE STUDY;  Surgeon: West Bali, MD;  Location: [facility];   Service: Endoscopy;  Laterality: N/A;  730   HEEL SPUR EXCISION     right    HEMORRHOID BANDING N/A 03/14/2013   Procedure: HEMORRHOID BANDING (Procedure #3)  3 bands applied [id number] Exp 02/02/2014 ;  Surgeon: West Bali, MD;  Location: [facility];  Service: Endoscopy;  Laterality: N/A;   HEMORRHOID SURGERY N/A 04/24/2016   Procedure: EXTENSIVE HEMORRHOIDECTOMY;  Surgeon: Ancil Linsey, MD;  Location: [facility];  Service: General;  Laterality: N/A;   LAPAROSCOPY     adhesions   POLYPECTOMY N/A 03/14/2013   OZD:GUYQ Gastritis . ULCERS SEEN ON MAY 6 HAVE HEALED   POLYPECTOMY  03/31/2016   Procedure: POLYPECTOMY;  Surgeon: West Bali, MD;  Location: [facility];  Service: Endoscopy;;  sigmoid colon polyps x2, rectal polyps x2   POLYPECTOMY  04/21/2021   Procedure: POLYPECTOMY;  Surgeon: Lanelle Bal, DO;  Location: [facility];  Service: Endoscopy;;   RECTAL EXAM UNDER ANESTHESIA N/A 07/23/2021   Procedure: RECTAL EXAM UNDER ANESTHESIA;  Surgeon: Romie Levee, MD;  Location: [facility];  Service: General;  Laterality: N/A;   SPHINCTEROTOMY N/A 07/23/2021   Procedure: CHEMICAL SPHINCTEROTOMY;  Surgeon: Romie Levee, MD;  Location: [facility];  Service: General;  Laterality: N/A;   TONSILLECTOMY     TUBAL LIGATION      FAMILY HISTORY: Family History  Problem Relation Age of Onset   Colon cancer Mother        55s   Urticaria Mother    Hypertension Mother    Cancer Mother    Cancer Father        oral   Crohn's disease Sister    Colon cancer Maternal Grandfather    Colon cancer Paternal Grandfather  Diabetes Brother    Colon cancer Maternal Grandmother    Anesthesia problems Neg Hx     SOCIAL HISTORY: Social History   Socioeconomic History   Marital status: Widowed    Spouse name: Not on file   Number of children: Not on file   Years of education: Not on file   Highest education level: Not on file  Occupational History    Occupation: homemaker    Employer: UNEMPLOYED  Tobacco Use   Smoking status: Former    Packs/day: 0.50    Years: 30.00    Pack years: 15.00    Types: Cigarettes   Smokeless tobacco: Former    Quit date: 10/15/1996   Tobacco comments:    Stopped smoking ~ 2000  Vaping Use   Vaping Use: Never used  Substance and Sexual Activity   Alcohol use: Yes    Alcohol/week: 4.0 standard drinks    Types: 4 Glasses of wine per week   Drug use: No   Sexual activity: Yes    Birth control/protection: Surgical  Other Topics Concern   Not on file  Social History Narrative   Does not routinely exercise. Husband passed in JUN 2015 due to prostate ca.   Social Determinants of Health   Financial Resource Strain: Low Risk    Difficulty of Paying Living Expenses: Not hard at all  Food Insecurity: No Food Insecurity   Worried About Programme researcher, broadcasting/film/video in the Last Year: Never true   Barista in the Last Year: Never true  Transportation Needs: Personal assistant (Medical): Yes   Lack of Transportation (Non-Medical): Yes  Physical Activity: Sufficiently Active   Days of Exercise per Week: 7 days   Minutes of Exercise per Session: 60 min  Stress: No Stress Concern Present   Feeling of Stress : Not at all  Social Connections: Socially Isolated   Frequency of Communication with Friends and Family: More than three times a week   Frequency of Social Gatherings with Friends and Family: Never   Attends Religious Services: Never   Database administrator or Organizations: No   Attends Banker Meetings: Never   Marital Status: Widowed  Catering manager Violence: Not At Risk   Fear of Current or Ex-Partner: No   Emotionally Abused: No   Physically Abused: No   Sexually Abused: No    PHYSICAL EXAM    GENERAL EXAM/CONSTITUTIONAL: Vitals:  Vitals:   12/17/21 1241  BP: 128/80  Pulse: 85  Weight: 218 lb (98.9 kg)  Height: 5\' 7"  (1.702 m)    Body mass index is 34.14 kg/m. Wt Readings from Last 3 Encounters:  12/17/21 218 lb (98.9 kg)  12/16/21 218 lb (98.9 kg)  11/27/21 218 lb (98.9 kg)   Patient is in no distress; well developed, nourished and groomed; neck is supple  CARDIOVASCULAR: Examination of carotid arteries is normal; no carotid bruits Regular rate and rhythm, no murmurs Examination of peripheral vascular system by observation and palpation is normal  EYES: Pupils round and reactive to light, Visual fields full to confrontation on the right, report vision loss with the left. Extraocular movements intacts   MUSCULOSKELETAL: Gait, strength, tone, movements noted in Neurologic exam below  NEUROLOGIC: MENTAL STATUS:  No flowsheet data found. awake, alert, oriented to person, place and time recent and remote memory intact normal attention and concentration language fluent, comprehension intact, naming intact fund of knowledge appropriate  CRANIAL NERVE:  2nd, 3rd, 4th, 6th - pupils equal and reactive to light, visual fields full to confrontation on the right, no vision on the left. extraocular muscles intact, no nystagmus 5th - facial sensation symmetric 7th - facial strength symmetric 8th - hearing intact 9th - palate elevates symmetrically, uvula midline 11th - shoulder shrug symmetric 12th - tongue protrusion midline  MOTOR:  normal bulk and tone, full strength in the BUE, BLE. RUE shoulder abduction is limited by pain.   SENSORY:  normal and symmetric to light touch, vibration  COORDINATION:  finger-nose-finger, fine finger movements normal  REFLEXES:  deep tendon reflexes present and symmetric  GAIT/STATION:  Normal, negative romberg      DIAGNOSTIC DATA (LABS, IMAGING, TESTING) - I reviewed patient records, labs, notes, testing and imaging myself where available.  Lab Results  Component Value Date   WBC 6.9 12/11/2020   HGB 13.6 07/23/2021   HCT 40.0 07/23/2021   MCV 93.1  12/11/2020   PLT 264 12/11/2020      Component Value Date/Time   NA 136 09/08/2021 1514   K 4.5 09/08/2021 1514   K 4.3 07/12/2012 0755   CL 99 09/08/2021 1514   CL 100 07/12/2012 0755   CO2 20 09/08/2021 1514   GLUCOSE 116 (H) 09/08/2021 1514   GLUCOSE 125 (H) 07/23/2021 1024   BUN 16 09/08/2021 1514   CREATININE 1.17 (H) 09/08/2021 1514   CREATININE 1.23 (H) 12/11/2020 1023   CALCIUM 9.9 09/08/2021 1514   CALCIUM 11.0 07/12/2012 0755   PROT 6.8 09/08/2021 1514   PROT 8.1 07/12/2012 0755   ALBUMIN 4.5 09/08/2021 1514   AST 40 09/08/2021 1514   AST 42 07/12/2012 0755   ALT 27 09/08/2021 1514   ALKPHOS 55 09/08/2021 1514   ALKPHOS 32 07/12/2012 0755   BILITOT 0.7 09/08/2021 1514   BILITOT 1.0 07/12/2012 0755   GFRNONAA 36 (L) 07/22/2020 1441   GFRAA 41 (L) 07/22/2020 1441   Lab Results  Component Value Date   CHOL 228 (H) 12/11/2020   HDL 33 (L) 12/11/2020   LDLCALC 156 (H) 12/11/2020   TRIG 231 (H) 12/11/2020   CHOLHDL 6.9 (H) 12/11/2020   Lab Results  Component Value Date   HGBA1C 5.1 12/11/2020   Lab Results  Component Value Date   VITAMINB12 681 11/24/2016   Lab Results  Component Value Date   TSH 2.200 09/08/2021     ASSESSMENT AND PLAN  71 y.o. year old female with complaint of right arm weakness which improved after right shoulder ESI, currently she denies any pain and no weakness even though the right shoulder abduction is limited by pain.  She denies any neck pain.  She does have a history of migraines, currently having 4 headaches days per week.  I will start her on Nurtec every other day as preventive medication, advised patient to take 1 extra dose if she experiences pain.  I will also obtain a brain MRI for further study.  I will contact the patient to go over the result.  She lives alone, she is independent in all ADLs and IADLs.  Advised to continue her other medications, follow-up with Hilda Lias and I will see her in 6 months for follow-up. She is  comfortable with plans.    1. Chronic migraine without aura without status migrainosus, not intractable   2. Chronic right-sided low back pain without sciatica   3. Right shoulder pain, unspecified chronicity      Patient Instructions  Start Nurtec  every other day, can take additional dose if you experience headaches Continue other medication MRI brain without contrast, I will contact you to go over the result Follow-up in 6 months   Orders Placed This Encounter  Procedures   MR BRAIN WO CONTRAST    Meds ordered this encounter  Medications   Rimegepant Sulfate (NURTEC) 75 MG TBDP    Sig: Take 75 mg by mouth every other day.    Dispense:  16 tablet    Refill:  11    Return in about 6 months (around 06/19/2022).  I have spent a total of 47 minutes dedicated to this patient today, preparing to see patient, performing a medically appropriate examination and evaluation, ordering tests and/or medications and procedures, and counseling and educating the patient/family/caregiver; independently interpreting result and communicating results to the family/patient/caregiver; and documenting clinical information in the electronic medical record.   Windell Norfolk, MD 12/17/2021, 1:44 PM  [facility] [address], [address] [place], [place] [address] [phone]

## 2021-12-17 NOTE — Patient Instructions (Addendum)
Start Nurtec every other day, can take additional dose if you experience headaches ?Continue other medication ?MRI brain without contrast, I will contact you to go over the result ?Follow-up in 6 months ? ?

## 2021-12-30 ENCOUNTER — Telehealth: Payer: Medicare Other

## 2021-12-30 ENCOUNTER — Telehealth: Payer: Self-pay | Admitting: Internal Medicine

## 2021-12-30 NOTE — Telephone Encounter (Signed)
Please call patient and let her know about the transportation that we are suppose to schedule for patients ?

## 2021-12-30 NOTE — Telephone Encounter (Signed)
Patient needs transportation for 4/4 appt with Dr Luna Glasgow.  ?

## 2021-12-31 ENCOUNTER — Ambulatory Visit: Payer: Medicare Other | Admitting: Internal Medicine

## 2021-12-31 NOTE — Telephone Encounter (Signed)
Patient called back via voice mail message - said calling back to speak with [name] about transportation for her appointment with Dr Luna Glasgow on 01/06/22. [phone] ?

## 2021-12-31 NOTE — Telephone Encounter (Signed)
Email submitted to [facility].  I have s/w patient and she is aware.  I will call her once I receive confirmation on the transportation as well.   ?

## 2022-01-01 NOTE — Telephone Encounter (Signed)
Called patient and advised that [name] has confirmed transportation.   ?

## 2022-01-02 DIAGNOSIS — N182 Chronic kidney disease, stage 2 (mild): Secondary | ICD-10-CM | POA: Diagnosis not present

## 2022-01-02 DIAGNOSIS — I10 Essential (primary) hypertension: Secondary | ICD-10-CM | POA: Diagnosis not present

## 2022-01-05 ENCOUNTER — Ambulatory Visit (HOSPITAL_COMMUNITY)
Admission: RE | Admit: 2022-01-05 | Discharge: 2022-01-05 | Disposition: A | Discharge status: Home / Self Care | Payer: Medicare Other | Source: Ambulatory Visit | Attending: Neurology | Admitting: Neurology

## 2022-01-05 DIAGNOSIS — G9389 Other specified disorders of brain: Secondary | ICD-10-CM | POA: Diagnosis not present

## 2022-01-05 DIAGNOSIS — G43709 Chronic migraine without aura, not intractable, without status migrainosus: Secondary | ICD-10-CM | POA: Insufficient documentation

## 2022-01-05 DIAGNOSIS — G43909 Migraine, unspecified, not intractable, without status migrainosus: Secondary | ICD-10-CM | POA: Diagnosis not present

## 2022-01-06 ENCOUNTER — Other Ambulatory Visit: Payer: Self-pay

## 2022-01-06 ENCOUNTER — Ambulatory Visit (INDEPENDENT_AMBULATORY_CARE_PROVIDER_SITE_OTHER): Payer: Medicare Other | Admitting: Orthopaedic Surgery

## 2022-01-06 ENCOUNTER — Encounter: Payer: Self-pay | Admitting: Orthopaedic Surgery

## 2022-01-06 ENCOUNTER — Ambulatory Visit: Payer: Medicare Other

## 2022-01-06 VITALS — BP 115/67 | HR 86 | Ht 67.0 in | Wt 217.4 lb

## 2022-01-06 DIAGNOSIS — M25511 Pain in right shoulder: Secondary | ICD-10-CM

## 2022-01-06 DIAGNOSIS — M5441 Lumbago with sciatica, right side: Secondary | ICD-10-CM

## 2022-01-06 DIAGNOSIS — G8929 Other chronic pain: Secondary | ICD-10-CM

## 2022-01-06 NOTE — Progress Notes (Signed)
My shoulder is better.  My back is hurting now. ? ?Her right shoulder has much less pain.  She has been doing exercises that were given to her when she had left shoulder pain.  It has been very helpful. ? ?Her right sided weakness is resolving.  She saw the neurologist.  She had a MRI of the brain yesterday.  She is not weak on the right side now. ? ?She has not fallen in a while.  That is a major improvement. ? ?She has return of chronic lower back pain.  She had MRI in 2019 showing changes in the L5-S1 area.  She says her pain has returned and she "locks up" in the back at times.  The last time was two months ago.  She has right sided weakness when it happens.  She would like to be re-evaluated for this. ? ?Right shoulder has near full ROM.  NV intact.  Strength and tone normal. ? ?Lower back is essentially negative.   ?Spine/Pelvis examination: ? Inspection:  Overall, sacoiliac joint benign and hips nontender; without crepitus or defects. ? ? Thoracic spine inspection: Alignment normal without kyphosis present ? ? Lumbar spine inspection:  Alignment  with normal lumbar lordosis, without scoliosis apparent. ? ? Thoracic spine palpation:  without tenderness of spinal processes ? ? Lumbar spine palpation: without tenderness of lumbar area; without tightness of lumbar muscles  ? ? Range of Motion: ?  Lumbar flexion, forward flexion is normal without pain or tenderness  ?  Lumbar extension is full without pain or tenderness ?  Left lateral bend is normal without pain or tenderness ?  Right lateral bend is normal without pain or tenderness ?  Straight leg raising is normal ? Strength & tone: normal ? ? Stability overall normal stability ? ?X-rays were done of the lumbar spine, reported separately. ? ?Encounter Diagnoses  ?Name Primary?  ? Chronic right-sided low back pain with right-sided sciatica Yes  ? Chronic pain in right shoulder   ? ?I will get X-rays of the lumbar spine. ? ?I will get MRI of the lumbar  spine. ? ?She may need to see neurosurgeon. ? ?She is to remain on current medicines. ? ?Return in two weeks. ? ?Call if any problem. ? ?Precautions discussed. ? ?Electronically Signed ?Sanjuana Kava, MD ?4/4/20239:54 AM ? ? ?

## 2022-01-06 NOTE — Telephone Encounter (Signed)
Patient called back after today's visit 01/06/22, relaying that [name] will be setting up her transportation with [employer], for her MRI at [facility] on 01/21/22, and for her appointment at our office on 01/27/22. Please advise patient. ?

## 2022-01-07 ENCOUNTER — Other Ambulatory Visit: Payer: Self-pay

## 2022-01-07 ENCOUNTER — Telehealth: Payer: Self-pay

## 2022-01-07 DIAGNOSIS — E039 Hypothyroidism, unspecified: Secondary | ICD-10-CM

## 2022-01-07 NOTE — Telephone Encounter (Signed)
Patient called and asked to have you call her [phone]. ?

## 2022-01-08 ENCOUNTER — Ambulatory Visit: Payer: Self-pay | Admitting: *Deleted

## 2022-01-08 DIAGNOSIS — I10 Essential (primary) hypertension: Secondary | ICD-10-CM

## 2022-01-08 DIAGNOSIS — N182 Chronic kidney disease, stage 2 (mild): Secondary | ICD-10-CM

## 2022-01-08 NOTE — Chronic Care Management (AMB) (Signed)
? ?  01/08/2022 ? ?[name] ?[birth year] ?[id number] ? ? ? ?Care Management  ? ?Follow Up Note ? ? ?01/08/2022 ?Name: [name] MRN: [id number] DOB: 07/10/1951 ? ? ?Referred by: Lindell Spar, MD ?Reason for referral : Chronic Care Management (CKD, HTN) ? ? ?Consulting civil engineer received voicemail message from patient on 01/07/22 requesting discharge from [facility] program. ?Care plan updated and completed.  Case closed. ? ?Jacqlyn Larsen RNC, BSN ?RN Case Manager ?[facility] ?[phone] ? ? ?

## 2022-01-16 ENCOUNTER — Encounter: Payer: Self-pay | Admitting: Internal Medicine

## 2022-01-16 ENCOUNTER — Ambulatory Visit (INDEPENDENT_AMBULATORY_CARE_PROVIDER_SITE_OTHER): Payer: Medicare Other | Admitting: Internal Medicine

## 2022-01-16 VITALS — BP 136/70 | HR 88 | Ht 67.0 in | Wt 218.1 lb

## 2022-01-16 DIAGNOSIS — R140 Abdominal distension (gaseous): Secondary | ICD-10-CM | POA: Diagnosis not present

## 2022-01-16 DIAGNOSIS — Z8619 Personal history of other infectious and parasitic diseases: Secondary | ICD-10-CM

## 2022-01-16 DIAGNOSIS — R197 Diarrhea, unspecified: Secondary | ICD-10-CM

## 2022-01-16 DIAGNOSIS — R159 Full incontinence of feces: Secondary | ICD-10-CM

## 2022-01-16 DIAGNOSIS — R1310 Dysphagia, unspecified: Secondary | ICD-10-CM | POA: Diagnosis not present

## 2022-01-16 DIAGNOSIS — Z9049 Acquired absence of other specified parts of digestive tract: Secondary | ICD-10-CM | POA: Diagnosis not present

## 2022-01-16 DIAGNOSIS — R112 Nausea with vomiting, unspecified: Secondary | ICD-10-CM | POA: Diagnosis not present

## 2022-01-16 DIAGNOSIS — K760 Fatty (change of) liver, not elsewhere classified: Secondary | ICD-10-CM | POA: Diagnosis not present

## 2022-01-16 MED ORDER — COLESTIPOL HCL 5 G PO PACK: 5.0000 g | PACK | Freq: Two times a day (BID) | ORAL | 12 refills | Status: DC

## 2022-01-16 NOTE — Patient Instructions (Signed)
If you are age 69 or older, your body mass index should be between 23-30. Your Body mass index is 34.16 kg/m?Marland Kitchen If this is out of the aforementioned range listed, please consider follow up with your Primary Care Provider. ? ?If you are age 1 or younger, your body mass index should be between 19-25. Your Body mass index is 34.16 kg/m?Marland Kitchen If this is out of the aformentioned range listed, please consider follow up with your Primary Care Provider.  ? ?Your provider has requested that you go to the basement level for lab work before leaving today. Press "B" on the elevator. The lab is located at the first door on the left as you exit the elevator. ? ?We have sent a referral to Pelvic floor therapy and they will contact you with an appointment. ? ?We have sent the following medications to your pharmacy for you to pick up at your convenience: Colestipol 5 g  ? ? ?The [place] [facility] providers would like to encourage you to use [facility] to communicate with providers for non-urgent requests or questions.  Due to long hold times on the telephone, sending your provider a message by [facility] may be a faster and more efficient way to get a response.  Please allow 48 business hours for a response.  Please remember that this is for non-urgent requests.  ? ?It was a pleasure to see you today! ? ?Thank you for trusting me with your gastrointestinal care!   ? ? ? ? ?

## 2022-01-16 NOTE — Progress Notes (Signed)
? ?Chief Complaint: Diarrhea, fecal incontinence ? ?HPI : 71 year old female with history of colon polyps, asthma, PE, hypothyroidism, IBS, hemorrhoids s/p hemorrhoidectomy in 12/14/2015, prior anal fissure s/p sphincterotomy in 07/2021 presents with diarrhea and fecal incontinence. ? ?She wants to be able to travel this summer and does not want to have the surprises bowel movements that she is having. She is going to be gone for over a month starting the end of June 2023 for a trip. She does not want to have fecal incontinence if possible. Ever since her hemorrhoid surgery, her anal sphincter has not been as strong as it had been in the past. She is transferring care from [place] because she has had difficulty with care access in [place] (long waits for appointments). She had her hemorrhoid surgery in 12-14-2015 and then developed a circular sore in her anus at that time. 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 Then she went to see Dr. Marcello Moores for the anal fissure, which was worsening over time. She underwent sphincterotomy with Dr. Marcello Moores in 07/2021. After the surgery she has had healing of the anal fissure. She was diagnosed with C dif in 09/2021, unclear etiology. She took a course of PO vancomycin after that, and had significant improvement in her BMs. She has taken herself off of Dexilant therapy due to concern for developing C dif. She drinks ginger tea now for acid reflux. She has tried multiple things to try to reduce her fecal incontinence. She bends over and will have fecal incontinence. Some vegetables will run through her so she tries to avoid them. When she tries to squeeze her anal sphincter, it will sometimes cause her stool to shoot out faster instead of holding the stool in. Last colonoscopy was in 12/13/20 with colon polyps. If she wants to go outside, she has to take Imodium in order to keep her bowel under control. Currently she is having on average 8 BMs per day. These will happen  after she eats and also happen randomly. The BMs are sometimes solid and sometimes fluffy, not completely loose. Sometimes when she passes a BM, she will have N&V. When she does take Imodium, it can sometimes help with reducing her BMs. She still feels like she sometimes notes the smell of C dif on occasion. Endorses dysphagia to solids and liquids at the bottom of her throat that started a few months. Denies vomitus. Can occasionally see small amounts of bleeding. Endorses fam hx of colon cancer and colon polyps. ? ?Wt Readings from Last 3 Encounters:  ?01/16/22 218 lb 2 oz (98.9 kg)  ?01/06/22 217 lb 6.4 oz (98.6 kg)  ?12/17/21 218 lb (98.9 kg)  ? ?Past Medical History:  ?Diagnosis Date  ? Anxiety   ? Arthritis   ? Asthma   ? Cataract   ? Phreesia 01/05/2021  ? Chest pain   ? a. 02/2000 Cath: nl cors, EF 60%;  b. 10/2010 MV: nl LV, no ischemia/infarct;  c. 03/2014 Admit c/p, r/o->grief from husbands death.  ? Chronic RUQ pain 2007/12/14  ? EUS slightly dilated CBD (7.61m), otherwise nl  ? Depression   ? Eczema   ? Exposure to chemical inhalation mid 1970s  ? Fatty liver   ? G6PD deficiency   ? GERD (gastroesophageal reflux disease)   ? Glaucoma   ? History of pulmonary embolus (PE)   ? Treated with Eliquis 203-10-16 ? HTN (hypertension)   ? Hypercholesteremia   ? a. intolerant  to statins.  ? Hypothyroidism   ? IBS (irritable bowel syndrome)   ? Kidney stone   ? Legally blind   ? 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 She has limited vision in the right eye.  ? Ocular migraine   ? Palpitations   ? Pneumonia 07/16/2021  ? Patient was exposed to a harsh chemical in the 1970's that created scar tissue in her lungs. She has had pneumonia several times in the past.  ? PONV (postoperative nausea and vomiting)   ? Recurrent upper respiratory infection (URI)   ? Renal cyst   ? Sickle cell trait ([place])   ? Tubular adenoma of colon 09/17/2011  ? Urticaria   ? ? ? ?Past Surgical History:  ?Procedure Laterality Date  ? ABDOMINAL  HYSTERECTOMY    ? ADENOIDECTOMY    ? ANGIOPLASTY    ? APPENDECTOMY    ? BIOPSY N/A 03/14/2013  ? Procedure: SMALL BOWEL AND GASTRIC BIOPSIES (Procedure #1);  Surgeon: Danie Binder, MD;  [facility];  Service: Endoscopy;  Laterality: N/A;  ? CARDIAC CATHETERIZATION Left 02/11/2000  ? CATARACT EXTRACTION Left   ? CATARACT EXTRACTION W/PHACO Right 03/19/2021  ? Procedure: CATARACT EXTRACTION PHACO AND INTRAOCULAR LENS PLACEMENT ([place]) RIGHT 6.75 00:50.4;  Surgeon: Leandrew Koyanagi, MD;  Location: [facility];  Service: Ophthalmology;  Laterality: Right;  ? CHOLECYSTECTOMY  12/2007  ? COLONOSCOPY  12/22/2010  ? BWG:YKZLDJ, cecal adenomatous polyp  ? COLONOSCOPY WITH PROPOFOL N/A 03/31/2016  ? Dr. Oneida Alar: four sessile polyps rectum/sigmoid colon, diverticulosi, ext/int hemorrhoids. hyperplastic polyps, next tcs 5 years.  ? COLONOSCOPY WITH PROPOFOL N/A 04/21/2021  ? anal fissure, non-bleeding internal hemorrhoids, right colon diverticulosis, one 2 mm polyp in ascending, three 4-6 mm polyps in descending colon. Tubular adenomas. 5 year surveillance.  ? complete hysterectomy    ? ENTEROSCOPY N/A 03/14/2013  ? TTS:VXBL gastritis/ulcers has healed  ? ESOPHAGOGASTRODUODENOSCOPY  09/22/2011  ? TJQ:ZESP gastritis/Duodenitis  ? EXCISIONAL HEMORRHOIDECTOMY    ? FLEXIBLE SIGMOIDOSCOPY N/A 03/14/2013  ? SLF:3 colon polyp removed/moderate sized internal hemorrhoids  ? FLEXIBLE SIGMOIDOSCOPY N/A 06/09/2016  ? Procedure: FLEXIBLE SIGMOIDOSCOPY;  Surgeon: Danie Binder, MD;  [facility];  Service: Endoscopy;  Laterality: N/A;  rectal polyps times 2  ? FLEXIBLE SIGMOIDOSCOPY N/A 03/18/2018  ? Procedure: FLEXIBLE SIGMOIDOSCOPY;  Surgeon: Danie Binder, MD;  [facility];  Service: Endoscopy;  Laterality: N/A;  9:15am  ? FOOT SURGERY    ? bunion removal left  ? GIVENS CAPSULE STUDY N/A 02/10/2013  ? Procedure: GIVENS CAPSULE STUDY;  Surgeon: Danie Binder, MD;  [facility];   Service: Endoscopy;  Laterality: N/A;  730  ? HEEL SPUR EXCISION    ? right   ? HEMORRHOID BANDING N/A 03/14/2013  ? Procedure: HEMORRHOID BANDING (Procedure #3)  3 bands applied [id number] Exp 02/02/2014 ;  Surgeon: Danie Binder, MD;  [facility];  Service: Endoscopy;  Laterality: N/A;  ? HEMORRHOID SURGERY N/A 04/24/2016  ? Procedure: EXTENSIVE HEMORRHOIDECTOMY;  Surgeon: Vickie Epley, MD;  Location: [facility];  Service: General;  Laterality: N/A;  ? LAPAROSCOPY    ? adhesions  ? POLYPECTOMY N/A 03/14/2013  ? FHL:KTGY Gastritis . ULCERS SEEN ON MAY 6 HAVE HEALED  ? POLYPECTOMY  03/31/2016  ? Procedure: POLYPECTOMY;  Surgeon: Danie Binder, MD;  [facility];  Service: Endoscopy;;  sigmoid colon polyps x2, rectal polyps x2  ? POLYPECTOMY  04/21/2021  ?  Procedure: POLYPECTOMY;  Surgeon: Eloise Harman, DO;  [facility];  Service: Endoscopy;;  ? RECTAL EXAM UNDER ANESTHESIA N/A 07/23/2021  ? Procedure: RECTAL EXAM UNDER ANESTHESIA;  Surgeon: Leighton Ruff, MD;  Location[facility];  Service: General;  Laterality: N/A;  ? SPHINCTEROTOMY N/A 07/23/2021  ? Procedure: CHEMICAL SPHINCTEROTOMY;  Surgeon: Leighton Ruff, MD;  [facility];  Service: General;  Laterality: N/A;  ? TONSILLECTOMY    ? TUBAL LIGATION    ? ?Family History  ?Problem Relation Age of Onset  ? Colon cancer Mother   ?     3s  ? Urticaria Mother   ? Hypertension Mother   ? Cancer Mother   ? Cancer Father   ?     oral  ? Crohn's disease Sister   ? Colon cancer Maternal Grandfather   ? Colon cancer Paternal Grandfather   ? Diabetes Brother   ? Colon cancer Maternal Grandmother   ? Anesthesia problems Neg Hx   ? ?Social History  ? ?Tobacco Use  ? Smoking status: Former  ?  Packs/day: 0.50  ?  Years: 30.00  ?  Pack years: 15.00  ?  Types: Cigarettes  ? Smokeless tobacco: Former  ?  Quit date: 10/15/1996  ? Tobacco comments:  ?  Stopped smoking ~ 2000  ?Vaping Use  ? Vaping Use:  Never used  ?Substance Use Topics  ? Alcohol use: Yes  ?  Alcohol/week: 4.0 standard drinks  ?  Types: 4 Glasses of wine per week  ? Drug use: No  ? ?Current Outpatient Medications  ?Medication Sig Dispense Refill

## 2022-01-20 ENCOUNTER — Other Ambulatory Visit: Payer: Self-pay

## 2022-01-20 ENCOUNTER — Telehealth: Payer: Self-pay | Admitting: Internal Medicine

## 2022-01-20 ENCOUNTER — Other Ambulatory Visit: Payer: Self-pay | Admitting: *Deleted

## 2022-01-20 ENCOUNTER — Encounter: Payer: Self-pay | Admitting: Internal Medicine

## 2022-01-20 ENCOUNTER — Telehealth: Payer: Self-pay

## 2022-01-20 ENCOUNTER — Ambulatory Visit (INDEPENDENT_AMBULATORY_CARE_PROVIDER_SITE_OTHER): Payer: Medicare Other | Admitting: Internal Medicine

## 2022-01-20 VITALS — BP 140/84 | HR 85 | Resp 18 | Ht 67.0 in | Wt 216.0 lb

## 2022-01-20 DIAGNOSIS — E782 Mixed hyperlipidemia: Secondary | ICD-10-CM

## 2022-01-20 DIAGNOSIS — E039 Hypothyroidism, unspecified: Secondary | ICD-10-CM | POA: Diagnosis not present

## 2022-01-20 DIAGNOSIS — J4530 Mild persistent asthma, uncomplicated: Secondary | ICD-10-CM

## 2022-01-20 DIAGNOSIS — R7303 Prediabetes: Secondary | ICD-10-CM

## 2022-01-20 DIAGNOSIS — F5104 Psychophysiologic insomnia: Secondary | ICD-10-CM

## 2022-01-20 DIAGNOSIS — R110 Nausea: Secondary | ICD-10-CM

## 2022-01-20 DIAGNOSIS — I10 Essential (primary) hypertension: Secondary | ICD-10-CM

## 2022-01-20 MED ORDER — IRBESARTAN 150 MG PO TABS: 75.0000 mg | ORAL_TABLET | Freq: Every day | ORAL | 3 refills | Status: DC

## 2022-01-20 MED ORDER — ONDANSETRON HCL 4 MG PO TABS: 4.0000 mg | ORAL_TABLET | Freq: Three times a day (TID) | ORAL | 1 refills | Status: DC | PRN

## 2022-01-20 MED ORDER — ESTRADIOL 0.1 MG/GM VA CREA: 1.0000 | TOPICAL_CREAM | Freq: Every day | VAGINAL | 3 refills | Status: DC | PRN

## 2022-01-20 MED ORDER — ZOLPIDEM TARTRATE 10 MG PO TABS: ORAL_TABLET | ORAL | 3 refills | Status: DC

## 2022-01-20 MED ORDER — FENOFIBRATE 145 MG PO TABS: 145.0000 mg | ORAL_TABLET | Freq: Every day | ORAL | 3 refills | Status: DC

## 2022-01-20 MED ORDER — ALBUTEROL SULFATE HFA 108 (90 BASE) MCG/ACT IN AERS: 2.0000 | INHALATION_SPRAY | RESPIRATORY_TRACT | 3 refills | Status: DC | PRN

## 2022-01-20 MED ORDER — BUDESONIDE-FORMOTEROL FUMARATE 160-4.5 MCG/ACT IN AERO: 2.0000 | INHALATION_SPRAY | RESPIRATORY_TRACT | 3 refills | Status: DC | PRN

## 2022-01-20 MED ORDER — NITROGLYCERIN 0.4 MG/SPRAY TL SOLN: 1.0000 | 3 refills | Status: DC | PRN

## 2022-01-20 MED ORDER — ALBUTEROL SULFATE (2.5 MG/3ML) 0.083% IN NEBU: 2.5000 mg | INHALATION_SOLUTION | RESPIRATORY_TRACT | 3 refills | Status: DC | PRN

## 2022-01-20 MED ORDER — METRONIDAZOLE 0.75 % EX CREA: 1.0000 "application " | TOPICAL_CREAM | CUTANEOUS | 3 refills | Status: DC | PRN

## 2022-01-20 NOTE — Telephone Encounter (Signed)
Error

## 2022-01-20 NOTE — Progress Notes (Signed)
? ?Established Patient Office Visit ? ?Subjective:  ?Patient ID: [name], female    DOB: 04-05-1951  Age: 71 y.o. MRN: [id number] ? ?CC:  ?Chief Complaint  ?Patient presents with  ? Follow-up  ?  Follow up HTN and asthma pt cant see good still has issues out of sob symbicort helps a lot   ? ? ?HPI ?[name] is a 71 y.o. female with past medical history of HTN, GERD, IBS-C, NASH, anal fissure, hypothyroidism, OA, DDD of lumbar spine, anxiety/panic disorder, insomnia, left sided blind eye, G6PD mutation and sickle cell trait who presents for f/u of her chronic medical conditions. ? ?HTN: BP is well-controlled. Takes medications regularly. Patient denies headache, dizziness, chest pain, dyspnea or palpitations. ?  ?Asthma: Well-controlled. Denies any dyspnea or wheezing. ? ?She has very limited vision in the right eye after cataract surgery.  She has been having difficulty ambulating even at home, but denies any recent fall. ? ?She takes Ambien for insomnia.  She has Xanax for severe anxiety/panic episode, but does not require it frequently.  She denies anhedonia, SI or HI. ? ? ? ?Past Medical History:  ?Diagnosis Date  ? Anal fissure   ? Anxiety   ? Arthritis   ? Asthma   ? C. difficile diarrhea 09/17/2021  ? Cataract   ? Phreesia 01/05/2021  ? Chest pain   ? a. 02/2000 Cath: nl cors, EF 60%;  b. 10/2010 MV: nl LV, no ischemia/infarct;  c. 03/2014 Admit c/p, r/o->grief from husbands death.  ? Chronic RUQ pain 2007-12-02  ? EUS slightly dilated CBD (7.21m), otherwise nl  ? Depression   ? Diverticulosis   ? Eczema   ? Exposure to chemical inhalation mid 1970s  ? Fatty liver   ? G6PD deficiency   ? Gallstones   ? GERD (gastroesophageal reflux disease)   ? Glaucoma   ? History of pulmonary embolus (PE)   ? Treated with Eliquis 202-27-16 ? HTN (hypertension)   ? Hypercholesteremia   ? a. intolerant to statins.  ? Hypothyroidism   ? IBS (irritable bowel syndrome)   ? Kidney stone   ? Legally blind   ? Patient is  completely blind in the left eye. She has limited vision in the right eye.  ? Migraines   ? inner ocular  ? Ocular migraine   ? Palpitations   ? Pleurisy   ? Pneumonia 07/16/2021  ? Patient was exposed to a harsh chemical in the 1970's that created scar tissue in her lungs. She has had pneumonia several times in the past.  ? PONV (postoperative nausea and vomiting)   ? Pulmonary emboli ([place]   ? Recurrent upper respiratory infection (URI)   ? Renal cyst   ? Retinal cyst   ? Sickle cell trait ([place]   ? Tubular adenoma of colon 09/17/2011  ? Urticaria   ? ? ?Past Surgical History:  ?Procedure Laterality Date  ? ABDOMINAL HYSTERECTOMY    ? ADENOIDECTOMY    ? ANGIOPLASTY    ? APPENDECTOMY    ? BIOPSY N/A 03/14/2013  ? Procedure: SMALL BOWEL AND GASTRIC BIOPSIES (Procedure #1);  Surgeon: SDanie Binder MD;  [facility];  Service: Endoscopy;  Laterality: N/A;  ? CARDIAC CATHETERIZATION Left 02/11/2000  ? CATARACT EXTRACTION Left   ? CATARACT EXTRACTION W/PHACO Right 03/19/2021  ? Procedure: CATARACT EXTRACTION PHACO AND INTRAOCULAR LENS PLACEMENT ([facility]) RIGHT 6.75 00:50.4;  Surgeon: BLeandrew Koyanagi MD;  [place]  Service: Ophthalmology;  Laterality: Right;  ? CHOLECYSTECTOMY  12/2007  ? COLONOSCOPY  12/22/2010  ? LEX:NTZGYF, cecal adenomatous polyp  ? COLONOSCOPY WITH PROPOFOL N/A 03/31/2016  ? Dr. Oneida Alar: four sessile polyps rectum/sigmoid colon, diverticulosi, ext/int hemorrhoids. hyperplastic polyps, next tcs 5 years.  ? COLONOSCOPY WITH PROPOFOL N/A 04/21/2021  ? anal fissure, non-bleeding internal hemorrhoids, right colon diverticulosis, one 2 mm polyp in ascending, three 4-6 mm polyps in descending colon. Tubular adenomas. 5 year surveillance.  ? complete hysterectomy    ? ENTEROSCOPY N/A 03/14/2013  ? VCB:SWHQ gastritis/ulcers has healed  ? ESOPHAGOGASTRODUODENOSCOPY  09/22/2011  ? PRF:FMBW gastritis/Duodenitis  ? EXCISIONAL HEMORRHOIDECTOMY    ? FLEXIBLE SIGMOIDOSCOPY N/A  03/14/2013  ? SLF:3 colon polyp removed/moderate sized internal hemorrhoids  ? FLEXIBLE SIGMOIDOSCOPY N/A 06/09/2016  ? Procedure: FLEXIBLE SIGMOIDOSCOPY;  Surgeon: Danie Binder, MD;  [facility];  Service: Endoscopy;  Laterality: N/A;  rectal polyps times 2  ? FLEXIBLE SIGMOIDOSCOPY N/A 03/18/2018  ? Procedure: FLEXIBLE SIGMOIDOSCOPY;  Surgeon: Danie Binder, MD;  [facility];  Service: Endoscopy;  Laterality: N/A;  9:15am  ? FOOT SURGERY    ? bunion removal left  ? GIVENS CAPSULE STUDY N/A 02/10/2013  ? Procedure: GIVENS CAPSULE STUDY;  Surgeon: Danie Binder, MD;  [facility];  Service: Endoscopy;  Laterality: N/A;  730  ? HEEL SPUR EXCISION    ? right   ? HEMORRHOID BANDING N/A 03/14/2013  ? Procedure: HEMORRHOID BANDING (Procedure #3)  3 bands applied [id number] Exp 02/02/2014 ;  Surgeon: Danie Binder, MD;  [facility];  Service: Endoscopy;  Laterality: N/A;  ? HEMORRHOID SURGERY N/A 04/24/2016  ? Procedure: EXTENSIVE HEMORRHOIDECTOMY;  Surgeon: Vickie Epley, MD;  [facility];  Service: General;  Laterality: N/A;  ? LAPAROSCOPY    ? adhesions  ? POLYPECTOMY N/A 03/14/2013  ? XBL:TJQZ Gastritis . ULCERS SEEN ON MAY 6 HAVE HEALED  ? POLYPECTOMY  03/31/2016  ? Procedure: POLYPECTOMY;  Surgeon: Danie Binder, MD;  [facility];  Service: Endoscopy;;  sigmoid colon polyps x2, rectal polyps x2  ? POLYPECTOMY  04/21/2021  ? Procedure: POLYPECTOMY;  Surgeon: Eloise Harman, DO;  [facility];  Service: Endoscopy;;  ? RECTAL EXAM UNDER ANESTHESIA N/A 07/23/2021  ? Procedure: RECTAL EXAM UNDER ANESTHESIA;  Surgeon: Leighton Ruff, MD;  Location: [facility];  Service: General;  Laterality: N/A;  ? SPHINCTEROTOMY N/A 07/23/2021  ? Procedure: CHEMICAL SPHINCTEROTOMY;  Surgeon: Leighton Ruff, MD;  Location[facility];  Service: General;  Laterality: N/A;  ? TONSILLECTOMY    ? TUBAL LIGATION    ? ? ?Family  History  ?Problem Relation Age of Onset  ? Colon cancer Mother   ?     40s  ? Urticaria Mother   ? Heart disease Mother   ? Colon polyps Mother   ? Cancer Father   ?     oral  ? Crohn's disease Sister   ? Cancer Sister   ?     around heart  ? Colon polyps Sister   ? Diabetes Brother   ? Other Brother   ?     intestinal sugery  ? Colon polyps Brother   ? Colon cancer Maternal Grandmother   ? Colon cancer Maternal Grandfather   ? Colon cancer Paternal Grandfather   ? Anesthesia problems Neg Hx   ? ? ?Social History  ? ?Socioeconomic History  ?  Marital status: Widowed  ?  Spouse name: Not on file  ? Number of children: 1  ? Years of education: Not on file  ? Highest education level: Not on file  ?Occupational History  ? Occupation: homemaker  ?  Employer: UNEMPLOYED  ?Tobacco Use  ? Smoking status: Former  ?  Packs/day: 0.50  ?  Years: 30.00  ?  Pack years: 15.00  ?  Types: Cigarettes  ?  Quit date: 10/15/1996  ?  Years since quitting: 25.2  ? Smokeless tobacco: Never  ? Tobacco comments:  ?  Stopped smoking ~ 2000  ?Vaping Use  ? Vaping Use: Never used  ?Substance and Sexual Activity  ? Alcohol use: Yes  ?  Alcohol/week: 4.0 standard drinks  ?  Types: 4 Glasses of wine per week  ?  Comment: 1 drinks every couple of weeks  ? Drug use: No  ? Sexual activity: Yes  ?  Birth control/protection: Surgical  ?Other Topics Concern  ? Not on file  ?Social History Narrative  ? Does not routinely exercise. Husband passed in JUN 2015 due to prostate ca.  ? ?Social Determinants of Health  ? ?Financial Resource Strain: Low Risk   ? Difficulty of Paying Living Expenses: Not hard at all  ?Food Insecurity: No Food Insecurity  ? Worried About Charity fundraiser in the Last Year: Never true  ? Ran Out of Food in the Last Year: Never true  ?Transportation Needs: Unmet Transportation Needs  ? Lack of Transportation (Medical): Yes  ? Lack of Transportation (Non-Medical): Yes  ?Physical Activity: Sufficiently Active  ? Days of Exercise per  Week: 7 days  ? Minutes of Exercise per Session: 60 min  ?Stress: No Stress Concern Present  ? Feeling of Stress : Not at all  ?Social Connections: Socially Isolated  ? Frequency of Communication with Friends and Family: M

## 2022-01-20 NOTE — Telephone Encounter (Signed)
Patient came by office in regard to to care management tele calls. ? ?Patient states that she does not do phone visits her insurance does not cover. ? ? ?Can give patient a call back if needed  ? ?

## 2022-01-20 NOTE — Patient Instructions (Signed)
Please continue to take medications as prescribed. ? ?Please continue to follow low salt diet and ambulate as tolerated. ?

## 2022-01-20 NOTE — Telephone Encounter (Signed)
Patient called, states the Colestid medication sent to Med[facility] by mail should be 90 days scripts not 30 days. Patient also has questions regarding meds. Please advise.  ?

## 2022-01-21 ENCOUNTER — Ambulatory Visit (HOSPITAL_COMMUNITY)
Admission: RE | Admit: 2022-01-21 | Discharge: 2022-01-21 | Disposition: A | Discharge status: Home / Self Care | Payer: Medicare Other | Source: Ambulatory Visit | Attending: Orthopaedic Surgery | Admitting: Orthopaedic Surgery

## 2022-01-21 DIAGNOSIS — M5441 Lumbago with sciatica, right side: Secondary | ICD-10-CM | POA: Diagnosis not present

## 2022-01-21 DIAGNOSIS — G8929 Other chronic pain: Secondary | ICD-10-CM | POA: Insufficient documentation

## 2022-01-21 DIAGNOSIS — M48061 Spinal stenosis, lumbar region without neurogenic claudication: Secondary | ICD-10-CM | POA: Diagnosis not present

## 2022-01-21 LAB — BASIC METABOLIC PANEL
BUN/Creatinine Ratio: 12 (ref 12–28)
BUN: 13 mg/dL (ref 8–27)
CO2: 21 mmol/L (ref 20–29)
Calcium: 10.1 mg/dL (ref 8.7–10.3)
Chloride: 100 mmol/L (ref 96–106)
Creatinine, Ser: 1.08 mg/dL — ABNORMAL HIGH (ref 0.57–1.00)
Glucose: 114 mg/dL — ABNORMAL HIGH (ref 70–99)
Potassium: 4.3 mmol/L (ref 3.5–5.2)
Sodium: 138 mmol/L (ref 134–144)
eGFR: 56 mL/min/{1.73_m2} — ABNORMAL LOW (ref >59)

## 2022-01-21 LAB — HEMOGLOBIN A1C
Est. average glucose Bld gHb Est-mCnc: 103 mg/dL
Hgb A1c MFr Bld: 5.2 % (ref 4.8–5.6)

## 2022-01-21 LAB — LIPID PANEL
Chol/HDL Ratio: 7.9 ratio — ABNORMAL HIGH (ref 0.0–4.4)
Cholesterol, Total: 205 mg/dL — ABNORMAL HIGH (ref 100–199)
HDL: 26 mg/dL — ABNORMAL LOW (ref >39)
LDL Chol Calc (NIH): 139 mg/dL — ABNORMAL HIGH (ref 0–99)
Triglycerides: 222 mg/dL — ABNORMAL HIGH (ref 0–149)
VLDL Cholesterol Cal: 40 mg/dL (ref 5–40)

## 2022-01-21 NOTE — Telephone Encounter (Signed)
[facility] [employer] called regarding patient's labwork to be done on 4/25.  He said they need an email or something to provide the details of that appointment so they would have a paper trail regarding patient.  Please call and advise. ?

## 2022-01-21 NOTE — Telephone Encounter (Signed)
Called and scheduled transportation for patient to bring stool sample back to the lab on 01/27/22. Called patient and let her know that she was set up for transportation. ?

## 2022-01-22 NOTE — Assessment & Plan Note (Signed)
On Levothyroxine ?Follows up with Dr Dorris Fetch ?

## 2022-01-22 NOTE — Assessment & Plan Note (Signed)
On fenofibrate ?Check lipid profile ?

## 2022-01-22 NOTE — Assessment & Plan Note (Signed)
?  BP Readings from Last 1 Encounters:  ?01/20/22 140/84  ? ?Well-controlled with Irbesartan 75 mg QD ?Counseled for compliance with the medications ?Advised DASH diet and moderate exercise/walking as tolerated ?

## 2022-01-22 NOTE — Assessment & Plan Note (Signed)
Well-controlled with Symbicort and PRN Albuterol ?

## 2022-01-22 NOTE — Assessment & Plan Note (Signed)
Takes Ambien 10 mg qHS PRN ?

## 2022-01-23 NOTE — Telephone Encounter (Signed)
Called patient and let her know that she can bring sample day of Colonoscopy. Patient stated she found a ride with a friend to bring sample next week. ?

## 2022-01-26 ENCOUNTER — Telehealth: Payer: Self-pay | Admitting: Internal Medicine

## 2022-01-26 NOTE — Telephone Encounter (Signed)
Attempted to return call but was on hold for 20 minutes with no answer will try again.  ?

## 2022-01-26 NOTE — Telephone Encounter (Signed)
Inbound call from pharmacy stating that need some clarification on patients allergies to medication.  Please advise.   ?

## 2022-01-27 ENCOUNTER — Ambulatory Visit (INDEPENDENT_AMBULATORY_CARE_PROVIDER_SITE_OTHER): Payer: Medicare Other | Admitting: Orthopaedic Surgery

## 2022-01-27 ENCOUNTER — Encounter: Payer: Self-pay | Admitting: Orthopaedic Surgery

## 2022-01-27 DIAGNOSIS — M79604 Pain in right leg: Secondary | ICD-10-CM | POA: Diagnosis not present

## 2022-01-27 DIAGNOSIS — G8929 Other chronic pain: Secondary | ICD-10-CM | POA: Diagnosis not present

## 2022-01-27 DIAGNOSIS — M545 Low back pain, unspecified: Secondary | ICD-10-CM

## 2022-01-27 DIAGNOSIS — M25512 Pain in left shoulder: Secondary | ICD-10-CM

## 2022-01-27 DIAGNOSIS — M5441 Lumbago with sciatica, right side: Secondary | ICD-10-CM | POA: Diagnosis not present

## 2022-01-27 NOTE — Progress Notes (Signed)
My back is hurting and my right shoulder is more painful today. ? ?She has more pain of the right shoulder and overhead use.  She has no trauma, no weakness, no swelling, no numbness.  She has had injection in the past and would like one today. ? ?She had MRI of the lumbar spine showing: ?IMPRESSION: ?1. 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 ?2. Increased mild L3-4 spinal canal stenosis. ?3. Unchanged severe L5-S1 facet arthrosis. ? ?I have explained the findings of the MRI.  I have recommended consideration of epidural, she agrees.  I will have her see Dr. Ernestina Patches. ? ?I have independently reviewed the MRI.   ? ?She may need lumbar pillow for car ride to [place] in late June.  She will start looking. ? ?Spine/Pelvis examination: ? Inspection:  Overall, sacoiliac joint benign and hips nontender; without crepitus or defects. ? ? Thoracic spine inspection: Alignment normal without kyphosis present ? ? Lumbar spine inspection:  Alignment  with normal lumbar lordosis, without scoliosis apparent. ? ? Thoracic spine palpation:  without tenderness of spinal processes ? ? Lumbar spine palpation: without tenderness of lumbar area; without tightness of lumbar muscles  ? ? Range of Motion: ?  Lumbar flexion, forward flexion is normal without pain or tenderness  ?  Lumbar extension is full without pain or tenderness ?  Left lateral bend is normal without pain or tenderness ?  Right lateral bend is normal without pain or tenderness ?  Straight leg raising is normal ? Strength & tone: normal ? ? Stability overall normal stability ? ?Right shoulder has pain but good ROM with more pain in the extremes.  NV intact. ? ?Encounter Diagnoses  ?Name Primary?  ? Chronic right-sided low back pain with right-sided sciatica Yes  ? Lumbar pain with radiation down right leg   ? ?PROCEDURE NOTE: ? ?The patient request injection, verbal consent was obtained. ? ?The right shoulder was prepped  appropriately after time out was performed.  ? ?Sterile technique was observed and injection of 1 cc of DepoMedrol 22m with several cc's of plain xylocaine. Anesthesia was provided by ethyl chloride and a 20-gauge needle was used to inject the shoulder area. A posterior approach was used.  The injection was tolerated well. ? ?A band aid dressing was applied. ? ?The patient was advised to apply ice later today and tomorrow to the injection sight as needed. ?  ?I will have her see Dr. NErnestina Patches ? ?Return in six weeks. ? ?Call if any problem. ? ?Precautions discussed. ? ?Electronically Signed ?WSanjuana Kava MD ?4/25/202310:29 AM ? ?

## 2022-01-27 NOTE — Addendum Note (Signed)
Addended by: Brand Males E on: 01/27/2022 10:31 AM ? ? Modules accepted: Orders ? ?

## 2022-01-28 ENCOUNTER — Telehealth: Payer: Self-pay | Admitting: Internal Medicine

## 2022-01-28 ENCOUNTER — Other Ambulatory Visit: Payer: Self-pay | Admitting: Internal Medicine

## 2022-01-28 ENCOUNTER — Other Ambulatory Visit: Payer: Self-pay | Admitting: *Deleted

## 2022-01-28 ENCOUNTER — Telehealth: Payer: Self-pay

## 2022-01-28 DIAGNOSIS — F5104 Psychophysiologic insomnia: Secondary | ICD-10-CM

## 2022-01-28 MED ORDER — ALBUTEROL SULFATE (2.5 MG/3ML) 0.083% IN NEBU: 2.5000 mg | INHALATION_SOLUTION | RESPIRATORY_TRACT | 3 refills | Status: DC | PRN

## 2022-01-28 MED ORDER — IRBESARTAN 150 MG PO TABS: 75.0000 mg | ORAL_TABLET | Freq: Every day | ORAL | 3 refills | Status: DC

## 2022-01-28 MED ORDER — ZOLPIDEM TARTRATE 10 MG PO TABS: 10.0000 mg | ORAL_TABLET | Freq: Every evening | ORAL | 0 refills | Status: DC | PRN

## 2022-01-28 MED ORDER — FENOFIBRATE 145 MG PO TABS: 145.0000 mg | ORAL_TABLET | Freq: Every day | ORAL | 3 refills | Status: DC

## 2022-01-28 MED ORDER — ALBUTEROL SULFATE HFA 108 (90 BASE) MCG/ACT IN AERS: 2.0000 | INHALATION_SPRAY | RESPIRATORY_TRACT | 3 refills | Status: DC | PRN

## 2022-01-28 MED ORDER — METRONIDAZOLE 0.75 % EX CREA: 1.0000 "application " | TOPICAL_CREAM | CUTANEOUS | 3 refills | Status: DC | PRN

## 2022-01-28 MED ORDER — ESTRADIOL 0.1 MG/GM VA CREA: 1.0000 | TOPICAL_CREAM | Freq: Every day | VAGINAL | 3 refills | Status: DC | PRN

## 2022-01-28 MED ORDER — BUDESONIDE-FORMOTEROL FUMARATE 160-4.5 MCG/ACT IN AERO: 2.0000 | INHALATION_SPRAY | RESPIRATORY_TRACT | 3 refills | Status: DC | PRN

## 2022-01-28 NOTE — Telephone Encounter (Signed)
[name] with [facility] in regard to last tele  ? ?Patient has found AMBIEN does not need early refill.  ? ?Call back [name] ?[phone] ?

## 2022-01-28 NOTE — Telephone Encounter (Signed)
Patient called back and said she has not received any of these medicines and she is very low on her blood pressure medicine ? ?zolpidem (AMBIEN) 10 MG tablet ? ?albuterol (PROVENTIL) (2.5 MG/3ML) 0.083% nebulizer solution  ? ?albuterol (VENTOLIN HFA) 108 (90 Base) MCG/ACT inhaler ? ?budesonide-formoterol (SYMBICORT) 160-4.5 MCG/ACT inhaler  ? ?estradiol (ESTRACE VAGINAL) 0.1 MG/GM vaginal cream ? ?fenofibrate (TRICOR) 145 MG tablet  ? ?irbesartan (AVAPRO) 150 MG tablet ? ?metroNIDAZOLE (METROCREAM) 0.75 % cream  ? ?Pharmacy ? ?[facility] [place], [place] [address] [facility]  ?[address] [place], [place] [address]  ?Phone:  [phone]  Fax:  [phone]   ? ?

## 2022-01-28 NOTE — Telephone Encounter (Signed)
Medication sent to pharmacy  

## 2022-01-28 NOTE — Telephone Encounter (Signed)
Alex called from [place] direct line to [employer] [phone].  Fax # [phone]. ?Quicker if would send electronic instead of faxing it. ? ?Said patient never received her Ambien 10 mg at the beginning of the month its a control substance no refills left and need the okay to send it if still continue the medicine  ? ?Please give [name] call back if authorized or not. ? ? ? ?

## 2022-01-29 ENCOUNTER — Other Ambulatory Visit: Payer: Self-pay | Admitting: *Deleted

## 2022-01-29 MED ORDER — BUDESONIDE-FORMOTEROL FUMARATE 160-4.5 MCG/ACT IN AERO: 2.0000 | INHALATION_SPRAY | RESPIRATORY_TRACT | 3 refills | Status: DC | PRN

## 2022-01-29 MED ORDER — ALBUTEROL SULFATE (2.5 MG/3ML) 0.083% IN NEBU: 2.5000 mg | INHALATION_SOLUTION | RESPIRATORY_TRACT | 3 refills | Status: DC | PRN

## 2022-01-29 MED ORDER — ALBUTEROL SULFATE HFA 108 (90 BASE) MCG/ACT IN AERS: 2.0000 | INHALATION_SPRAY | RESPIRATORY_TRACT | 3 refills | Status: DC | PRN

## 2022-01-29 MED ORDER — IRBESARTAN 150 MG PO TABS: 75.0000 mg | ORAL_TABLET | Freq: Every day | ORAL | 3 refills | Status: DC

## 2022-01-29 MED ORDER — FENOFIBRATE 145 MG PO TABS: 145.0000 mg | ORAL_TABLET | Freq: Every day | ORAL | 3 refills | Status: DC

## 2022-01-29 MED ORDER — METRONIDAZOLE 0.75 % EX CREA
1.0000 | TOPICAL_CREAM | CUTANEOUS | 3 refills | Status: DC | PRN
Start: 2022-01-29 — End: 2023-11-04

## 2022-01-29 NOTE — Telephone Encounter (Signed)
Refaxed prescriptions 2nd time  ?

## 2022-01-29 NOTE — Telephone Encounter (Signed)
Patient called and said she contacted the [place] and spoke to [place].Patient said the only prescription they received were zolpidem (AMBIEN) 10 MG tablet and estradiol (ESTRACE VAGINAL) 0.1 MG/GM vaginal cream ? ?But did not receive all the other medicines. ? ?Per patient she is running really low on irbesartan (AVAPRO) 150 MG tablet ? ? ?Any questions please contact patient [phone]. ? ?

## 2022-01-30 DIAGNOSIS — Z23 Encounter for immunization: Secondary | ICD-10-CM | POA: Diagnosis not present

## 2022-02-05 ENCOUNTER — Other Ambulatory Visit: Payer: Self-pay

## 2022-02-05 ENCOUNTER — Telehealth: Payer: Self-pay | Admitting: Internal Medicine

## 2022-02-05 MED ORDER — COLESTIPOL HCL 5 G PO PACK: 5.0000 g | PACK | Freq: Two times a day (BID) | ORAL | 3 refills | Status: DC

## 2022-02-05 NOTE — Telephone Encounter (Signed)
Patient called questioning when she was supposed to take her Colestipol so that it won't interfere with the other medications she's taking.  Also, she gets her meds mail-ordered and the prescription needs to be for 90 days instead of 30.  Please call patient and advise. ?

## 2022-02-05 NOTE — Telephone Encounter (Signed)
Returned patients call and let her know that she should wait an hour between taking her regular medications and Colestipol 2 g. I also let patient know that a 90 day supply was sent to her pharmacy.  ?

## 2022-02-09 DIAGNOSIS — H401112 Primary open-angle glaucoma, right eye, moderate stage: Secondary | ICD-10-CM | POA: Diagnosis not present

## 2022-02-10 ENCOUNTER — Encounter: Payer: Self-pay | Admitting: Physical Medicine and Rehabilitation

## 2022-02-10 ENCOUNTER — Ambulatory Visit (INDEPENDENT_AMBULATORY_CARE_PROVIDER_SITE_OTHER): Payer: Medicare Other | Admitting: Physical Medicine and Rehabilitation

## 2022-02-10 DIAGNOSIS — M48062 Spinal stenosis, lumbar region with neurogenic claudication: Secondary | ICD-10-CM

## 2022-02-10 DIAGNOSIS — M4726 Other spondylosis with radiculopathy, lumbar region: Secondary | ICD-10-CM | POA: Diagnosis not present

## 2022-02-10 DIAGNOSIS — M5416 Radiculopathy, lumbar region: Secondary | ICD-10-CM

## 2022-02-10 DIAGNOSIS — M47816 Spondylosis without myelopathy or radiculopathy, lumbar region: Secondary | ICD-10-CM | POA: Diagnosis not present

## 2022-02-10 MED ORDER — DIAZEPAM 5 MG PO TABS: ORAL_TABLET | ORAL | 0 refills | Status: DC

## 2022-02-10 NOTE — Progress Notes (Signed)
? ?[name] - 71 y.o. female MRN [id number]  Date of birth: December 11, 1950 ? ?Office Visit Note: ?Visit Date: 02/10/2022 ?PCP: Lindell Spar, MD ?Referred by: Lindell Spar, MD ? ?Subjective: ?Chief Complaint  ?Patient presents with  ? Lower Back - Pain  ? ?HPI: [name] is a 71 y.o. female who comes in today at the request of Dr. Sanjuana Kava for evaluation of chronic, worsening and severe bilateral lower back pain radiating down legs, right greater than left.  Patient does report intermittent numbness and tingling sensation to right leg.  Patient reports pain has been ongoing for several years and worsened after mechanical fall in 2019.  She describes her pain as a constant sore, aching and tingling sensation, currently rates as 8 out of 10.  Patient reports some relief of pain with home exercise regimen, rest and use of medications.  Patient is currently taking Norco 5-325 mg as needed for moderate/severe pain that is prescribed by her primary care provider Dr. Ihor Dow. Patients recent lumbar MRI exhibits moderate spinal canal stenosis at the level of L4-L5 and mild spinal canal stenosis at L3-L4. There is also severe facet arthritis at L5-S1.  Patient states she briefly talked to Dr. Luna Glasgow about lumbar epidural steroid injection procedure, however she continues to have concerns and would like to discuss this procedure in detail.  Patient reports history of significant medical problems and does voice anxiety related to procedure.  Patient states she has a vacation planned for the end of June and would like to have injection before she leaves.  Patient states pain is negatively impacting her daily life and makes it difficult to perform tasks.  Patient denies focal weakness.  Patient denies recent trauma or falls. ? ?Review of Systems  ?Musculoskeletal:  Positive for back pain.  ?Neurological:  Positive for tingling. Negative for focal weakness and weakness.  ?All other systems reviewed and  are negative. Otherwise per HPI. ? ?Assessment & Plan: ?Visit Diagnoses:  ?  ICD-10-CM   ?1. Lumbar radiculopathy  M54.16 Ambulatory referral to Physical Medicine Rehab  ?  ?2. Spinal stenosis of lumbar region with neurogenic claudication  M48.062 Ambulatory referral to Physical Medicine Rehab  ?  ?3. Other spondylosis with radiculopathy, lumbar region  M47.26 Ambulatory referral to Physical Medicine Rehab  ?  ?4. Facet hypertrophy of lumbar region  M47.816   ?  ?   ?Plan: Findings:  ?Chronic, worsening and severe bilateral lower back pain radiating to legs, right greater than left. Patient continues to have severe pain despite good conservative therapies such as home exercise regimen, rest and use of medications. Patients clinical presentation and exam are consistent with neurogenic claudication as a result of spinal canal stenosis.  We believe the neck step is to perform a diagnostic and hopefully therapeutic right L4-L5 interlaminar epidural steroid injection under fluoroscopic guidance.  Patient is not currently taking anticoagulants.  I did discuss lumbar epidural steroid injection procedure in detail today with patient and friend.  I reassured her that we are able to perform this injection safely under fluoroscopic guidance and that we would begin injecting a type of corticosteroid medication.  Patient has no questions/concerns at this time.  We will call her to schedule after insurance approval.  No red flag symptoms noted upon exam today.  ? ?Meds & Orders:  ?Meds ordered this encounter  ?Medications  ? diazepam (VALIUM) 5 MG tablet  ?  Sig: Take one tablet by mouth with food  one hour prior to procedure. May repeat 30 minutes prior if needed.  ?  Dispense:  2 tablet  ?  Refill:  0  ?  Order Specific Question:   Supervising Provider  ?  AnswerMagnus Sinning [[id number]]  ?  ?Orders Placed This Encounter  ?Procedures  ? Ambulatory referral to Physical Medicine Rehab  ?  ?Follow-up: Return for Right L4-L5  interlaminar epidural steroid injection.  ? ?Procedures: ?No procedures performed  ?   ? ?Clinical History: ?MRI LUMBAR SPINE WITHOUT CONTRAST ?  ?TECHNIQUE: ?Multiplanar, multisequence MR imaging of the lumbar spine was ?performed. No intravenous contrast was administered. ?  ?COMPARISON:  09/19/2018 ?  ?FINDINGS: ?Segmentation:  Standard. ?  ?Alignment:  Unchanged grade 1 anterolisthesis at L5-S1. ?  ?Vertebrae:  No fracture, evidence of discitis, or bone lesion. ?  ?Conus medullaris and cauda equina: Conus extends to the L1 level. ?Conus and cauda equina appear normal. ?  ?Paraspinal and other soft tissues: Negative. ?  ?Disc levels: ?  ?L1-L2: Normal disc space and facet joints. No spinal canal stenosis. ?No neural foraminal stenosis. ?  ?L2-L3: Normal disc space and facet joints. No spinal canal stenosis. ?No neural foraminal stenosis. ?  ?L3-L4: Increased mild disc bulge. Mild spinal canal stenosis. No ?neural foraminal stenosis. ?  ?L4-L5: Unchanged moderate disc bulge and facet hypertrophy. Moderate ?spinal canal stenosis. Mild left neural foraminal stenosis. ?  ?L5-S1: Unchanged severe facet hypertrophy. No spinal canal stenosis. ?No neural foraminal stenosis. ?  ?Visualized sacrum: Normal. ?  ?IMPRESSION: ?1. 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 ?2. Increased mild L3-4 spinal canal stenosis. ?3. Unchanged severe L5-S1 facet arthrosis. ?  ?  ?Electronically Signed ?  By: Ulyses Jarred M.D. ?  On: 01/22/2022 03:45  ? ?She reports that she quit smoking about 25 years ago. Her smoking use included cigarettes. She has a 15.00 pack-year smoking history. She has never used smokeless tobacco.  ?Recent Labs  ?  01/20/22 ?1433  ?HGBA1C 5.2  ? ? ?Objective:  VS:  HT:    WT:   BMI:     BP:   HR: bpm  TEMP: ( )  RESP:  ?Physical Exam ?Vitals and nursing note reviewed.  ?HENT:  ?   Head: Normocephalic and atraumatic.  ?   Right Ear: External ear normal.  ?   Left  Ear: External ear normal.  ?   Nose: Nose normal.  ?   Mouth/Throat:  ?   Mouth: Mucous membranes are moist.  ?Eyes:  ?   Extraocular Movements: Extraocular movements intact.  ?Cardiovascular:  ?   Rate and Rhythm: Normal rate.  ?   Pulses: Normal pulses.  ?Pulmonary:  ?   Effort: Pulmonary effort is normal.  ?Abdominal:  ?   General: Abdomen is flat. There is no distension.  ?Musculoskeletal:     ?   General: Tenderness present.  ?   Cervical back: Normal range of motion.  ?   Comments: Pt rises from seated position to standing without difficulty. Good lumbar range of motion. Strong distal strength without clonus, no pain upon palpation of greater trochanters. Sensation intact bilaterally. Walks independently, gait steady.   ?Skin: ?   General: Skin is warm and dry.  ?   Capillary Refill: Capillary refill takes less than 2 seconds.  ?Neurological:  ?   General: No focal deficit present.  ?   Mental Status: She is alert and oriented to  person, place, and time.  ?Psychiatric:     ?   Mood and Affect: Mood normal.     ?   Behavior: Behavior normal.  ?  ?Ortho Exam ? ?Imaging: ?No results found. ? ?Past Medical/Family/Surgical/Social History: ?Medications & Allergies reviewed per EMR, new medications updated. ?Patient Active Problem List  ? Diagnosis Date Noted  ? Adjustment disorder 09/09/2021  ? Ocular migraine 05/17/2021  ? Encounter for home safety review for injury prevention 05/17/2021  ? Family history of colon cancer 02/18/2021  ? Atypical chest pain 01/08/2021  ? Chronic back pain 07/22/2020  ? Spinal stenosis at L4-L5 level 07/22/2020  ? Hypothyroidism 05/10/2020  ? 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 Primary open angle glaucoma (POAG) of both eyes 01/12/2020  ? Generalized OA 01/12/2020  ? Arthritis of carpometacarpal [facility]) joint of left thumb 09/20/2018  ? Injury of digital nerve of left little finger 09/20/2018  ? 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 Rosacea 09/04/2015  ? Constipation   ? PE  (pulmonary thromboembolism) ([place]) 07/26/2015  ? DDD (degenerative disc disease), lumbosacral 02/13/2015  ? Depression 02/12/2015  ? Vaginal atrophy 11/14/2014  ? Hearing loss 11/15/2013  ? Knee pain 09/05/2013  ? Lung

## 2022-02-10 NOTE — Progress Notes (Signed)
Wants to discuss injection. Has A LOT of issues with bowels and wants to make sure cortisone doesn't constipate and may other questions. Going on trip end of June so interested in injection for the bad pain she has in her back. ?

## 2022-02-11 ENCOUNTER — Encounter: Payer: Self-pay | Admitting: Physical Therapy

## 2022-02-11 ENCOUNTER — Ambulatory Visit: Payer: Medicare Other | Attending: Internal Medicine | Admitting: Physical Therapy

## 2022-02-11 DIAGNOSIS — R1310 Dysphagia, unspecified: Secondary | ICD-10-CM | POA: Insufficient documentation

## 2022-02-11 DIAGNOSIS — R140 Abdominal distension (gaseous): Secondary | ICD-10-CM | POA: Diagnosis not present

## 2022-02-11 DIAGNOSIS — R279 Unspecified lack of coordination: Secondary | ICD-10-CM | POA: Diagnosis not present

## 2022-02-11 DIAGNOSIS — M62838 Other muscle spasm: Secondary | ICD-10-CM | POA: Insufficient documentation

## 2022-02-11 DIAGNOSIS — M6281 Muscle weakness (generalized): Secondary | ICD-10-CM | POA: Diagnosis not present

## 2022-02-11 DIAGNOSIS — R293 Abnormal posture: Secondary | ICD-10-CM | POA: Diagnosis not present

## 2022-02-11 DIAGNOSIS — R159 Full incontinence of feces: Secondary | ICD-10-CM | POA: Diagnosis not present

## 2022-02-11 DIAGNOSIS — K760 Fatty (change of) liver, not elsewhere classified: Secondary | ICD-10-CM | POA: Diagnosis not present

## 2022-02-11 DIAGNOSIS — R269 Unspecified abnormalities of gait and mobility: Secondary | ICD-10-CM | POA: Insufficient documentation

## 2022-02-11 DIAGNOSIS — Z9049 Acquired absence of other specified parts of digestive tract: Secondary | ICD-10-CM | POA: Insufficient documentation

## 2022-02-11 DIAGNOSIS — R197 Diarrhea, unspecified: Secondary | ICD-10-CM | POA: Diagnosis not present

## 2022-02-11 NOTE — Therapy (Signed)
?OUTPATIENT PHYSICAL THERAPY FEMALE PELVIC EVALUATION ? ? ?Patient Name: [name] ?MRN: [id number] ?DOB:03/30/1951, 71 y.o., female ?Today's Date: 02/11/2022 ? ? ? ?Past Medical History:  ?Diagnosis Date  ? Anal fissure   ? Anxiety   ? Arthritis   ? Asthma   ? C. difficile diarrhea 09/17/2021  ? Cataract   ? Phreesia 01/05/2021  ? Chest pain   ? a. 02/2000 Cath: nl cors, EF 60%;  b. 10/2010 MV: nl LV, no ischemia/infarct;  c. 03/2014 Admit c/p, r/o->grief from husbands death.  ? Chronic RUQ pain 04-Dec-2007  ? EUS slightly dilated CBD (7.2m), otherwise nl  ? Depression   ? Diverticulosis   ? Eczema   ? Exposure to chemical inhalation mid 1970s  ? Fatty liver   ? G6PD deficiency   ? Gallstones   ? GERD (gastroesophageal reflux disease)   ? Glaucoma   ? History of pulmonary embolus (PE)   ? Treated with Eliquis 2Feb 29, 2016 ? HTN (hypertension)   ? Hypercholesteremia   ? a. intolerant to statins.  ? Hypothyroidism   ? IBS (irritable bowel syndrome)   ? Kidney stone   ? Legally blind   ? 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 She has limited vision in the right eye.  ? Migraines   ? inner ocular  ? Ocular migraine   ? Palpitations   ? Pleurisy   ? Pneumonia 07/16/2021  ? Patient was exposed to a harsh chemical in the 1970's that created scar tissue in her lungs. She has had pneumonia several times in the past.  ? PONV (postoperative nausea and vomiting)   ? Pulmonary emboli ([place]   ? Recurrent upper respiratory infection (URI)   ? Renal cyst   ? Retinal cyst   ? Sickle cell trait ([place]   ? Tubular adenoma of colon 09/17/2011  ? Urticaria   ? ?Past Surgical History:  ?Procedure Laterality Date  ? ABDOMINAL HYSTERECTOMY    ? ADENOIDECTOMY    ? ANGIOPLASTY    ? APPENDECTOMY    ? BIOPSY N/A 03/14/2013  ? Procedure: SMALL BOWEL AND GASTRIC BIOPSIES (Procedure #1);  Surgeon: SDanie Binder MD;  Location: [facility];  Service: Endoscopy;  Laterality: N/A;  ? CARDIAC CATHETERIZATION Left 02/11/2000  ? CATARACT EXTRACTION Left   ?  CATARACT EXTRACTION W/PHACO Right 03/19/2021  ? Procedure: CATARACT EXTRACTION PHACO AND INTRAOCULAR LENS PLACEMENT ([place] RIGHT 6.75 00:50.4;  Surgeon: BLeandrew Koyanagi MD;  Location: [facility]  Service: Ophthalmology;  Laterality: Right;  ? CHOLECYSTECTOMY  12/2007  ? COLONOSCOPY  12/22/2010  ? RGEZ:MOQHUT cecal adenomatous polyp  ? COLONOSCOPY WITH PROPOFOL N/A 03/31/2016  ? Dr. FOneida Alar four sessile polyps rectum/sigmoid colon, diverticulosi, ext/int hemorrhoids. hyperplastic polyps, next tcs 5 years.  ? COLONOSCOPY WITH PROPOFOL N/A 04/21/2021  ? anal fissure, non-bleeding internal hemorrhoids, right colon diverticulosis, one 2 mm polyp in ascending, three 4-6 mm polyps in descending colon. Tubular adenomas. 5 year surveillance.  ? complete hysterectomy    ? ENTEROSCOPY N/A 03/14/2013  ? SMLY:YTKPgastritis/ulcers has healed  ? ESOPHAGOGASTRODUODENOSCOPY  09/22/2011  ? STWS:FKCLgastritis/Duodenitis  ? EXCISIONAL HEMORRHOIDECTOMY    ? FLEXIBLE SIGMOIDOSCOPY N/A 03/14/2013  ? SLF:3 colon polyp removed/moderate sized internal hemorrhoids  ? FLEXIBLE SIGMOIDOSCOPY N/A 06/09/2016  ? Procedure: FLEXIBLE SIGMOIDOSCOPY;  Surgeon: SDanie Binder MD;  [facility];  Service: Endoscopy;  Laterality: N/A;  rectal polyps times 2  ? FLEXIBLE SIGMOIDOSCOPY N/A 03/18/2018  ? Procedure:  FLEXIBLE SIGMOIDOSCOPY;  Surgeon: Danie Binder, MD;  [facility];  Service: Endoscopy;  Laterality: N/A;  9:15am  ? FOOT SURGERY    ? bunion removal left  ? GIVENS CAPSULE STUDY N/A 02/10/2013  ? Procedure: GIVENS CAPSULE STUDY;  Surgeon: Danie Binder, MD;  [facility];  Service: Endoscopy;  Laterality: N/A;  730  ? HEEL SPUR EXCISION    ? right   ? HEMORRHOID BANDING N/A 03/14/2013  ? Procedure: HEMORRHOID BANDING (Procedure #3)  3 bands applied MPN#[id number] Exp 02/02/2014 ;  Surgeon: Danie Binder, MD;  Location: [facility];  Service: Endoscopy;  Laterality: N/A;  ? HEMORRHOID SURGERY N/A  04/24/2016  ? Procedure: EXTENSIVE HEMORRHOIDECTOMY;  Surgeon: Vickie Epley, MD;  Location: [facility];  Service: General;  Laterality: N/A;  ? LAPAROSCOPY    ? adhesions  ? POLYPECTOMY N/A 03/14/2013  ? QMG:QQPY Gastritis . ULCERS SEEN ON MAY 6 HAVE HEALED  ? POLYPECTOMY  03/31/2016  ? Procedure: POLYPECTOMY;  Surgeon: Danie Binder, MD;  [facility];  Service: Endoscopy;;  sigmoid colon polyps x2, rectal polyps x2  ? POLYPECTOMY  04/21/2021  ? Procedure: POLYPECTOMY;  Surgeon: Eloise Harman, DO;  [facility];  Service: Endoscopy;;  ? RECTAL EXAM UNDER ANESTHESIA N/A 07/23/2021  ? Procedure: RECTAL EXAM UNDER ANESTHESIA;  Surgeon: Leighton Ruff, MD;  Location: [facility];  Service: General;  Laterality: N/A;  ? SPHINCTEROTOMY N/A 07/23/2021  ? Procedure: CHEMICAL SPHINCTEROTOMY;  Surgeon: Leighton Ruff, MD;  Location: [facility];  Service: General;  Laterality: N/A;  ? TONSILLECTOMY    ? TUBAL LIGATION    ? ?Patient Active Problem List  ? Diagnosis Date Noted  ? Adjustment disorder 09/09/2021  ? Ocular migraine 05/17/2021  ? Encounter for home safety review for injury prevention 05/17/2021  ? Family history of colon cancer 02/18/2021  ? Atypical chest pain 01/08/2021  ? Chronic back pain 07/22/2020  ? Spinal stenosis at L4-L5 level 07/22/2020  ? Hypothyroidism 05/10/2020  ? 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 Primary open angle glaucoma (POAG) of both eyes 01/12/2020  ? Generalized OA 01/12/2020  ? Arthritis of carpometacarpal [facility]) joint of left thumb 09/20/2018  ? Injury of digital nerve of left little finger 09/20/2018  ? 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 Rosacea 09/04/2015  ? Constipation   ? PE (pulmonary thromboembolism) ([place]) 07/26/2015  ? DDD (degenerative disc disease), lumbosacral 02/13/2015  ? Depression 02/12/2015  ? Vaginal atrophy 11/14/2014  ? Hearing loss 11/15/2013  ? Knee pain 09/05/2013  ? Lung nodule 08/14/2013  ? Nodule  of tendon sheath 08/14/2013  ? Hemorrhoids, internal, with bleeding 04/05/2013  ? Hyperlipidemia 02/28/2013  ? Nonalcoholic steatohepatitis ([facility]) 02/28/2013  ? Essential hypertension, benign 02/07/2013  ? Class 1 obesity due to excess calories with serious comorbidity and body mass index (BMI) of 33.0 to 33.9 in adult 02/07/2013  ? GERD (gastroesophageal reflux disease) 02/07/2013  ? Rectal bleeding 11/02/2012  ? CKD (chronic kidney disease), stage II 09/21/2012  ? Asthma 08/25/2012  ? Incontinence overflow, stress female 08/25/2012  ? Anxiety 08/25/2012  ? Chronic insomnia 08/25/2012  ? Irritable bowel syndrome 12/10/2010  ? ? ?PCP: Lindell Spar, MD ? ?REFERRING PROVIDER: Sharyn Creamer, MD ? ?REFERRING DIAG:  ?R19.7 (ICD-10-CM) - Diarrhea, unspecified type  ?R15.9 (ICD-10-CM) - Incontinence of feces, unspecified fecal incontinence type  ?K76.0 (ICD-10-CM) - Hepatic steatosis  ?R14.0 (ICD-10-CM) -  Bloating  ?R13.10 (ICD-10-CM) - Dysphagia, unspecified type  ?Z86.19 (ICD-10-CM) - History of Clostridioides difficile infection  ?Z90.49 (ICD-10-CM) - History of cholecystectomy  ?R11.2 (ICD-10-CM) - Nausea and vomiting, unspecified vomiting type  ? ? ?THERAPY DIAG:  ?Muscle weakness (generalized) ? ?Unspecified lack of coordination ? ?Other muscle spasm ? ?Abnormal posture ? ?Abnormality of gait and mobility ? ?ONSET DATE: 2015 ? ?SUBJECTIVE:                                                                                                                                                                                          ? ?SUBJECTIVE STATEMENT: ?Pt has chronic history of abdominal pain and  fecal leakage and urgency since 2017 hemorrhoidectomy with after multiple hemorrhoid bandings in 2013 and then worsening post sphincterotomy in 07/2021.  ?Fluid intake: Yes: 6-7 20oz water bottles   ? ?Patient confirms identification and approves PT to assess pelvic floor and treatment Yes ? ? ?PAIN:  ?Are you having pain?  Yes ?NPRS scale: 5/10 ?Pain location:  anterior pelvic pain ? ?Pain type: aching and cramping ?Pain description: constant  ? ? ?PRECAUTIONS: Other: allergic to tape and latex, low vision ? ?WEIGHT BEARING RESTRI

## 2022-02-25 ENCOUNTER — Telehealth: Payer: Medicare Other

## 2022-02-25 ENCOUNTER — Encounter: Payer: Self-pay | Admitting: Internal Medicine

## 2022-02-25 ENCOUNTER — Other Ambulatory Visit: Payer: Medicare Other

## 2022-02-25 ENCOUNTER — Ambulatory Visit (AMBULATORY_SURGERY_CENTER): Payer: Medicare Other | Admitting: Internal Medicine

## 2022-02-25 VITALS — BP 130/68 | HR 81 | Temp 97.8°F | Resp 20 | Ht 67.0 in | Wt 218.0 lb

## 2022-02-25 DIAGNOSIS — Z8619 Personal history of other infectious and parasitic diseases: Secondary | ICD-10-CM | POA: Diagnosis not present

## 2022-02-25 DIAGNOSIS — K317 Polyp of stomach and duodenum: Secondary | ICD-10-CM | POA: Diagnosis not present

## 2022-02-25 DIAGNOSIS — R159 Full incontinence of feces: Secondary | ICD-10-CM

## 2022-02-25 DIAGNOSIS — R140 Abdominal distension (gaseous): Secondary | ICD-10-CM

## 2022-02-25 DIAGNOSIS — A09 Infectious gastroenteritis and colitis, unspecified: Secondary | ICD-10-CM | POA: Diagnosis not present

## 2022-02-25 DIAGNOSIS — K2970 Gastritis, unspecified, without bleeding: Secondary | ICD-10-CM | POA: Diagnosis not present

## 2022-02-25 DIAGNOSIS — I10 Essential (primary) hypertension: Secondary | ICD-10-CM | POA: Diagnosis not present

## 2022-02-25 DIAGNOSIS — Z9049 Acquired absence of other specified parts of digestive tract: Secondary | ICD-10-CM

## 2022-02-25 DIAGNOSIS — K3189 Other diseases of stomach and duodenum: Secondary | ICD-10-CM | POA: Diagnosis not present

## 2022-02-25 DIAGNOSIS — R1310 Dysphagia, unspecified: Secondary | ICD-10-CM

## 2022-02-25 DIAGNOSIS — R197 Diarrhea, unspecified: Secondary | ICD-10-CM

## 2022-02-25 DIAGNOSIS — R112 Nausea with vomiting, unspecified: Secondary | ICD-10-CM

## 2022-02-25 DIAGNOSIS — K760 Fatty (change of) liver, not elsewhere classified: Secondary | ICD-10-CM | POA: Diagnosis not present

## 2022-02-25 DIAGNOSIS — K635 Polyp of colon: Secondary | ICD-10-CM | POA: Diagnosis not present

## 2022-02-25 MED ORDER — FAMOTIDINE 20 MG PO TABS: 20.0000 mg | ORAL_TABLET | Freq: Two times a day (BID) | ORAL | 0 refills | Status: DC

## 2022-02-25 MED ORDER — COLESTIPOL HCL 1 G PO TABS: 2.0000 g | ORAL_TABLET | Freq: Two times a day (BID) | ORAL | 2 refills | Status: DC

## 2022-02-25 MED ORDER — SODIUM CHLORIDE 0.9 % IV SOLN: 500.0000 mL | Freq: Once | INTRAVENOUS | Status: DC

## 2022-02-25 NOTE — Patient Instructions (Signed)
YOU HAD AN ENDOSCOPIC PROCEDURE TODAY AT THE  ENDOSCOPY CENTER:   Refer to the procedure report that was given to you for any specific questions about what was found during the examination.  If the procedure report does not answer your questions, please call your gastroenterologist to clarify.  If you requested that your care partner not be given the details of your procedure findings, then the procedure report has been included in a sealed envelope for you to review at your convenience later.  YOU SHOULD EXPECT: Some feelings of bloating in the abdomen. Passage of more gas than usual.  Walking can help get rid of the air that was put into your GI tract during the procedure and reduce the bloating. If you had a lower endoscopy (such as a colonoscopy or flexible sigmoidoscopy) you may notice spotting of blood in your stool or on the toilet paper. If you underwent a bowel prep for your procedure, you may not have a normal bowel movement for a few days.  Please Note:  You might notice some irritation and congestion in your nose or some drainage.  This is from the oxygen used during your procedure.  There is no need for concern and it should clear up in a day or so.  SYMPTOMS TO REPORT IMMEDIATELY:    Following upper endoscopy (EGD)  Vomiting of blood or coffee ground material  New chest pain or pain under the shoulder blades  Painful or persistently difficult swallowing  New shortness of breath  Fever of 100F or higher  Black, tarry-looking stools  For urgent or emergent issues, a gastroenterologist can be reached at any hour by calling [phone]. Do not use MyChart messaging for urgent concerns.    DIET:  We do recommend a small meal at first, but then you may proceed to your regular diet.  Drink plenty of fluids but you should avoid alcoholic beverages for 24 hours.  ACTIVITY:  You should plan to take it easy for the rest of today and you should NOT DRIVE or use heavy machinery  until tomorrow (because of the sedation medicines used during the test).    FOLLOW UP: Our staff will call the number listed on your records 48-72 hours following your procedure to check on you and address any questions or concerns that you may have regarding the information given to you following your procedure. If we do not reach you, we will leave a message.  We will attempt to reach you two times.  During this call, we will ask if you have developed any symptoms of COVID 19. If you develop any symptoms (ie: fever, flu-like symptoms, shortness of breath, cough etc.) before then, please call [phone].  If you test positive for Covid 19 in the 2 weeks post procedure, please call and report this information to us.    If any biopsies were taken you will be contacted by phone or by letter within the next 1-3 weeks.  Please call us at [phone] if you have not heard about the biopsies in 3 weeks.    SIGNATURES/CONFIDENTIALITY: You and/or your care partner have signed paperwork which will be entered into your electronic medical record.  These signatures attest to the fact that that the information above on your After Visit Summary has been reviewed and is understood.  Full responsibility of the confidentiality of this discharge information lies with you and/or your care-partner. 

## 2022-02-25 NOTE — Progress Notes (Signed)
Pt's states no medical or surgical changes since previsit or office visit. 

## 2022-02-25 NOTE — Progress Notes (Signed)
Report to PACU, RN, vss, BBS= Clear.  

## 2022-02-25 NOTE — Op Note (Signed)
[name] Patient Name: [name] Procedure Date: 02/25/2022 11:50 AM MRN: [id number] Endoscopist: Sonny Masters "[name] ,  Age: 71 Referring MD:  Date of Birth: 1950/11/22 Gender: Female Account #: [id number] Procedure:                Upper GI endoscopy Indications:              Dysphagia Medicines:                Monitored Anesthesia Care Procedure:                Pre-Anesthesia Assessment:                           - Prior to the procedure, a History and Physical                            was performed, and patient medications and                            allergies were reviewed. The patient's tolerance of                            previous anesthesia was also reviewed. The risks                            and benefits of the procedure and the sedation                            options and risks were discussed with the patient.                            All questions were answered, and informed consent                            was obtained. Prior Anticoagulants: The patient has                            taken no previous anticoagulant or antiplatelet                            agents. ASA Grade Assessment: III - A patient with                            severe systemic disease. After reviewing the risks                            and benefits, the patient was deemed in                            satisfactory condition to undergo the procedure.                           After obtaining informed consent, the endoscope was  passed under direct vision. Throughout the                            procedure, the patient's blood pressure, pulse, and                            oxygen saturations were monitored continuously. The                            GIF [id number] #[id number] was introduced through the                            mouth, and advanced to the second part of duodenum.                            The upper GI endoscopy was accomplished  without                            difficulty. The patient tolerated the procedure                            well. Scope In: Scope Out: Findings:                 The examined esophagus was normal. Biopsies were                            taken with a cold forceps for histology.                           Localized inflammation characterized by congestion                            (edema) and erythema was found in the gastric body.                            Biopsies were taken with a cold forceps for                            histology.                           Two 2 to 3 mm sessile polyps with no bleeding were                            found in the duodenal bulb and in the second                            portion of the duodenum. These polyps were removed                            with a cold biopsy forceps. Resection and retrieval  were complete. Complications:            No immediate complications. Estimated Blood Loss:     Estimated blood loss was minimal. Impression:               - Normal esophagus. Biopsied.                           - Gastritis. Biopsied.                           - Two duodenal polyps. Resected and retrieved. Recommendation:           - Discharge patient to home (with escort).                           - Await pathology results.                           - Avoid alcohol and spicy foods.                           - Avoid NSAIDs.                           - Use Pepcid (famotidine) 20 mg PO BID for 8 weeks.                           - Return to GI clinic as previously scheduled.                           - The findings and recommendations were discussed                            with the patient. [address]ia Reading,  02/25/2022 12:23:38 PM

## 2022-02-25 NOTE — Progress Notes (Signed)
Called to room to assist during endoscopic procedure.  Patient ID and intended procedure confirmed with present staff. Received instructions for my participation in the procedure from the performing physician.  

## 2022-02-25 NOTE — Progress Notes (Signed)
GASTROENTEROLOGY PROCEDURE H&P NOTE   Primary Care Physician: Lindell Spar, MD    Reason for Procedure:   Dysphagia  Plan:    EGD  Patient is appropriate for endoscopic procedure(s) in the ambulatory ([facility]) setting.  The nature of the procedure, as well as the risks, benefits, and alternatives were carefully and thoroughly reviewed with the patient. Ample time for discussion and questions allowed. The patient understood, was satisfied, and agreed to proceed.     HPI: [name] is a 71 y.o. female who presents for EGD for evaluation of dysphagia .  Patient was most recently seen in the Gastroenterology Clinic on 01/16/22.  No interval change in medical history since that appointment. Please refer to that note for full details regarding GI history and clinical presentation.   Past Medical History:  Diagnosis Date   Anal fissure    Anxiety    Arthritis    Asthma    C. difficile diarrhea 09/17/2021   Cataract    Phreesia 01/05/2021   Chest pain    a. 02/2000 Cath: nl cors, EF 60%;  b. 11/15/2010 MV: nl LV, no ischemia/infarct;  c. 03/2014 Admit c/p, r/o->grief from husbands death.   Chronic RUQ pain 2007/11/16   EUS slightly dilated CBD (7.77m), otherwise nl   Depression    Diverticulosis    Eczema    Exposure to chemical inhalation mid 1970s   Fatty liver    G6PD deficiency    Gallstones    GERD (gastroesophageal reflux disease)    Glaucoma    History of pulmonary embolus (PE)    Treated with Eliquis 211-Feb-2016  HTN (hypertension)    Hypercholesteremia    a. intolerant to statins.   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 She has limited vision in the right eye.   Migraines    inner ocular   Ocular migraine    Palpitations    Pleurisy    Pneumonia 07/16/2021   Patient was exposed to a harsh chemical in the 1970's that created scar tissue in her lungs. She has had pneumonia  several times in the past.   PONV (postoperative nausea and vomiting)    Pulmonary emboli (HCC)    Recurrent upper respiratory infection (URI)    Renal cyst    Retinal cyst    Sickle cell trait (HCC)    Tubular adenoma of colon 09/17/2011   Urticaria     Past Surgical History:  Procedure Laterality Date   ABDOMINAL HYSTERECTOMY     ADENOIDECTOMY     ANGIOPLASTY     APPENDECTOMY     BIOPSY N/A 03/14/2013   Procedure: SMALL BOWEL AND GASTRIC BIOPSIES (Procedure #1);  Surgeon: SDanie Binder MD;  Location: [facility];  Service: Endoscopy;  Laterality: N/A;   CARDIAC CATHETERIZATION Left 02/11/2000   CATARACT EXTRACTION Left    CATARACT EXTRACTION W/PHACO Right 03/19/2021   Procedure: CATARACT EXTRACTION PHACO AND INTRAOCULAR LENS PLACEMENT ([place] RIGHT 6.75 00:50.4;  Surgeon: BLeandrew Koyanagi MD;  [place] [facility]  Service: Ophthalmology;  Laterality: Right;   CHOLECYSTECTOMY  12/2007   COLONOSCOPY  12/22/2010   RFYB:OFBPZW cecal adenomatous polyp   COLONOSCOPY WITH PROPOFOL N/A 03/31/2016   Dr. FOneida Alar four sessile polyps rectum/sigmoid colon, diverticulosi, ext/int hemorrhoids. hyperplastic polyps, next tcs 5 years.   COLONOSCOPY WITH PROPOFOL N/A 04/21/2021  anal fissure, non-bleeding internal hemorrhoids, right colon diverticulosis, one 2 mm polyp in ascending, three 4-6 mm polyps in descending colon. Tubular adenomas. 5 year surveillance.   complete hysterectomy     ENTEROSCOPY N/A 03/14/2013   HYW:VPXT gastritis/ulcers has healed   ESOPHAGOGASTRODUODENOSCOPY  09/22/2011   GGY:IRSW gastritis/Duodenitis   EXCISIONAL HEMORRHOIDECTOMY     FLEXIBLE SIGMOIDOSCOPY N/A 03/14/2013   SLF:3 colon polyp removed/moderate sized internal hemorrhoids   FLEXIBLE SIGMOIDOSCOPY N/A 06/09/2016   Procedure: FLEXIBLE SIGMOIDOSCOPY;  Surgeon: Danie Binder, MD;  [facility];  Service: Endoscopy;  Laterality: N/A;  rectal polyps times 2   FLEXIBLE SIGMOIDOSCOPY N/A  03/18/2018   Procedure: FLEXIBLE SIGMOIDOSCOPY;  Surgeon: Danie Binder, MD;  [facility];  Service: Endoscopy;  Laterality: N/A;  9:15am   FOOT SURGERY     bunion removal left   GIVENS CAPSULE STUDY N/A 02/10/2013   Procedure: GIVENS CAPSULE STUDY;  Surgeon: Danie Binder, MD;  [facility];  Service: Endoscopy;  Laterality: N/A;  [place]     right    HEMORRHOID BANDING N/A 03/14/2013   Procedure: HEMORRHOID BANDING (Procedure #3)  3 bands applied [id number] Exp 02/02/2014 ;  Surgeon: Danie Binder, MD;  Location: [facility];  Service: Endoscopy;  Laterality: N/A;   HEMORRHOID SURGERY N/A 04/24/2016   Procedure: EXTENSIVE HEMORRHOIDECTOMY;  Surgeon: Vickie Epley, MD;  Location: [facility];  Service: General;  Laterality: N/A;   LAPAROSCOPY     adhesions   POLYPECTOMY N/A 03/14/2013   [facility]:WEXH Gastritis . ULCERS SEEN ON MAY 6 HAVE HEALED   POLYPECTOMY  03/31/2016   Procedure: POLYPECTOMY;  Surgeon: Danie Binder, MD;  [facility];  Service: Endoscopy;;  sigmoid colon polyps x2, rectal polyps x2   POLYPECTOMY  04/21/2021   Procedure: POLYPECTOMY;  Surgeon: Eloise Harman, DO;  [facility];  Service: Endoscopy;;   RECTAL EXAM UNDER ANESTHESIA N/A 07/23/2021   Procedure: RECTAL EXAM UNDER ANESTHESIA;  Surgeon: Leighton Ruff, MD;  Location: [facility];  Service: General;  Laterality: N/A;   SPHINCTEROTOMY N/A 07/23/2021   Procedure: CHEMICAL SPHINCTEROTOMY;  Surgeon: Leighton Ruff, MD;  Location: [facility];  Service: General;  Laterality: N/A;   TONSILLECTOMY     TUBAL LIGATION      Prior to Admission medications   Medication Sig Start Date End Date Taking? Authorizing Provider  ALPRAZolam Duanne Moron) 1 MG tablet Take 1 tablet (1 mg total) by mouth 2 (two) times daily as needed for anxiety. Patient taking differently: Take 0.5-1 mg by mouth at bedtime. 11/25/20  Yes [place], Modena Nunnery, MD   budesonide-formoterol [facility]) 160-4.5 MCG/ACT inhaler Inhale 2 puffs into the lungs as needed. 01/29/22  Yes Lindell Spar, MD  Cholecalciferol (VITAMIN D3) 50 MCG (2000 UT) TABS Take 2,000 Units by mouth daily.   Yes [provider]  diazepam (VALIUM) 5 MG tablet Take one tablet by mouth with food one hour prior to procedure. May repeat 30 minutes prior if needed. 02/10/22  Yes Lorine Bears, NP  fenofibrate (TRICOR) 145 MG tablet Take 1 tablet (145 mg total) by mouth daily. 01/29/22  Yes Lindell Spar, MD  irbesartan (AVAPRO) 150 MG tablet Take 0.5 tablets (75 mg total) by mouth daily. 01/29/22  Yes [place], Colin Broach, MD  latanoprost (XALATAN) 0.005 % ophthalmic solution Place 1 drop into both eyes at bedtime. 11/25/20  Yes [place], [place]  F, MD  levothyroxine (SYNTHROID) 25 MCG tablet Take 1 tablet (25 mcg total) by mouth daily before breakfast. 10/20/21  Yes [place], Marella Chimes, MD  Lifitegrast (XIIDRA) 5 % SOLN INSTILL 1 DROP IN BOTH EYES EVERY DAY AS NEEDED (EACH CONTAINER SHOULD YIELD 2 DROPS) STORE IN ORIGINAL FOIL POUCH Patient taking differently: Place 1 drop into both eyes daily. [facility] CONTAINER SHOULD YIELD 2 DROPS) STORE IN ORIGINAL FOIL POUCH 11/25/20  Yes [place], Modena Nunnery, MD  Magnesium 500 MG TABS Take 1 tablet by mouth daily.   Yes [provider]  Probiotic Product (PROBIOTIC DAILY PO) Take 1 capsule by mouth daily. VSL #3   Yes [provider]  zolpidem (AMBIEN) 10 MG tablet Take 1 tablet (10 mg total) by mouth at bedtime as needed for sleep. 01/28/22  Yes Lindell Spar, MD  albuterol (PROVENTIL) (2.5 MG/3ML) 0.083% nebulizer solution Take 3 mLs (2.5 mg total) by nebulization every 4 (four) hours as needed for wheezing or shortness of breath. 01/29/22   Lindell Spar, MD  albuterol (VENTOLIN HFA) 108 (90 Base) MCG/ACT inhaler Inhale 2 puffs into the lungs every 4 (four) hours as needed for wheezing. 01/29/22   Lindell Spar, MD  brimonidine  (ALPHAGAN) 0.2 % ophthalmic solution Place 1 drop into both eyes 2 (two) times daily. 11/25/20   Alycia Rossetti, MD  chlorpheniramine-HYDROcodone [facility] ER) 10-8 MG/5ML SUER Take 5 mLs by mouth every 12 (twelve) hours as needed. Patient taking differently: Take 5 mLs by mouth every 12 (twelve) hours as needed for cough. 11/25/20   Alycia Rossetti, MD  colestipol (COLESTID) 5 g packet Take 5 g by mouth 2 (two) times daily. 02/05/22   Sharyn Creamer, MD  cyclobenzaprine (FLEXERIL) 10 MG tablet TAKE ONE TABLET BY MOUTH DAILY  as needed muscle spasms Patient taking differently: Take 10 mg by mouth daily as needed (Arthritis and torn rotator cuff left). 11/25/20   Alycia Rossetti, MD  dicyclomine (BENTYL) 10 MG capsule Take one capsule before meals and at bedtime AS NEEDED for abdominal pain and frequent stool. HOLD for constipation Patient taking differently: Take 10 mg by mouth daily as needed (IBS). 11/25/20   [place], Modena Nunnery, MD  DUREZOL 0.05 % EMUL Place 1 drop into the right eye daily. 02/17/21   [provider]  estradiol (ESTRACE VAGINAL) 0.1 MG/GM vaginal cream Place 1 Applicatorful vaginally daily as needed. 01/28/22   Lindell Spar, MD  HYDROcodone-acetaminophen (NORCO/VICODIN) 5-325 MG tablet Take 1 tablet by mouth every 6 (six) hours as needed for moderate pain. 11/17/21   Lindell Spar, MD  hyoscyamine (ANASPAZ) 0.125 MG TBDP disintergrating tablet Place 1 tablet (0.125 mg total) under the tongue daily as needed (IBS). Place 0.125 mg under the tongue daily as needed (IBS). 09/03/21   Lindell Spar, MD  Lidocaine-Hydrocortisone Ace 3-2.5 % KIT APPLY TO RECTUM QID for 2 weeks then AS NEEDED FOR RECTAL PAIN OR BLEEDING 04/21/21   Eloise Harman, DO  meclizine (ANTIVERT) 12.5 MG tablet Take 1 tablet (12.5 mg total) by mouth 3 (three) times daily as needed for dizziness. Patient taking differently: Take 12.5 mg by mouth 3 (three) times daily as needed (vertigo).  11/25/20   Alycia Rossetti, MD  Menthol, Topical Analgesic, (BIOFREEZE EX) Apply 1 application topically daily as needed (pain).    [provider]  metroNIDAZOLE (METROCREAM) 0.75 % cream Apply 1 application. topically as needed. 01/29/22   Ihor Dow  K, MD  naproxen (NAPROSYN) 500 MG tablet Take 1 tablet (500 mg total) by mouth daily as needed for moderate pain or mild pain. 11/17/21   Lindell Spar, MD  neomycin-polymyxin b-dexamethasone (MAXITROL) 3.5-10000-0.1 OINT Place 1 application into both eyes at bedtime as needed. Patient taking differently: Place 1 application. into both eyes at bedtime as needed (Dry eyes). 01/12/20   [place], Modena Nunnery, MD  nitroGLYCERIN (NITROLINGUAL) 0.4 MG/SPRAY spray Place 1 spray under the tongue every 5 (five) minutes as needed for chest pain. 01/20/22   Lindell Spar, MD  NON FORMULARY Take 1 tablet by mouth daily as needed (Stomach Bloat). FD GUARD    [provider]  ondansetron (ZOFRAN) 4 MG tablet Take 1 tablet (4 mg total) by mouth every 8 (eight) hours as needed for nausea or vomiting. 01/20/22   Lindell Spar, MD  prednisoLONE acetate (PRED FORTE) 1 % ophthalmic suspension Place 1 drop into the right eye 3 (three) times daily. Patient not taking: Reported on 02/25/2022 11/05/21   [provider]  UNABLE TO FIND Place 1 application rectally daily as needed. [place] Name: lidocaine 5% pramoxine 1% ointment per rectum as needed.    [provider]    Current Outpatient Medications  Medication Sig Dispense Refill   ALPRAZolam (XANAX) 1 MG tablet Take 1 tablet (1 mg total) by mouth 2 (two) times daily as needed for anxiety. (Patient taking differently: Take 0.5-1 mg by mouth at bedtime.) 180 tablet 3   budesonide-formoterol (SYMBICORT) 160-4.5 MCG/ACT inhaler Inhale 2 puffs into the lungs as needed. 10.2 g 3   Cholecalciferol (VITAMIN D3) 50 MCG (2000 UT) TABS Take 2,000 Units by mouth daily.      diazepam (VALIUM) 5 MG tablet Take one tablet by mouth with food one hour prior to procedure. May repeat 30 minutes prior if needed. 2 tablet 0   fenofibrate (TRICOR) 145 MG tablet Take 1 tablet (145 mg total) by mouth daily. 90 tablet 3   irbesartan (AVAPRO) 150 MG tablet Take 0.5 tablets (75 mg total) by mouth daily. 45 tablet 3   latanoprost (XALATAN) 0.005 % ophthalmic solution Place 1 drop into both eyes at bedtime. 9 mL 3   levothyroxine (SYNTHROID) 25 MCG tablet Take 1 tablet (25 mcg total) by mouth daily before breakfast. 95 tablet 1   Lifitegrast (XIIDRA) 5 % SOLN INSTILL 1 DROP IN BOTH EYES EVERY DAY AS NEEDED (EACH CONTAINER SHOULD YIELD 2 DROPS) STORE IN ORIGINAL FOIL POUCH (Patient taking differently: Place 1 drop into both eyes daily. (EACH CONTAINER SHOULD YIELD 2 DROPS) STORE IN ORIGINAL FOIL POUCH) 60 each 3   Magnesium 500 MG TABS Take 1 tablet by mouth daily.     Probiotic Product (PROBIOTIC DAILY PO) Take 1 capsule by mouth daily. VSL #3     zolpidem (AMBIEN) 10 MG tablet Take 1 tablet (10 mg total) by mouth at bedtime as needed for sleep. 90 tablet 0   albuterol (PROVENTIL) (2.5 MG/3ML) 0.083% nebulizer solution Take 3 mLs (2.5 mg total) by nebulization every 4 (four) hours as needed for wheezing or shortness of breath. 150 mL 3   albuterol (VENTOLIN HFA) 108 (90 Base) MCG/ACT inhaler Inhale 2 puffs into the lungs every 4 (four) hours as needed for wheezing. 54 g 3   brimonidine (ALPHAGAN) 0.2 % ophthalmic solution Place 1 drop into both eyes 2 (two) times daily. 15 mL 3   chlorpheniramine-HYDROcodone (TUSSIONEX PENNKINETIC ER) 10-8 MG/5ML SUER  Take 5 mLs by mouth every 12 (twelve) hours as needed. (Patient taking differently: Take 5 mLs by mouth every 12 (twelve) hours as needed for cough.) 140 mL 0   colestipol (COLESTID) 5 g packet Take 5 g by mouth 2 (two) times daily. 90 each 3   cyclobenzaprine (FLEXERIL) 10 MG tablet TAKE ONE TABLET BY MOUTH DAILY  as needed muscle spasms  (Patient taking differently: Take 10 mg by mouth daily as needed (Arthritis and torn rotator cuff left).) 90 tablet 3   dicyclomine (BENTYL) 10 MG capsule Take one capsule before meals and at bedtime AS NEEDED for abdominal pain and frequent stool. HOLD for constipation (Patient taking differently: Take 10 mg by mouth daily as needed (IBS).) 120 capsule 1   DUREZOL 0.05 % EMUL Place 1 drop into the right eye daily.     estradiol (ESTRACE VAGINAL) 0.1 MG/GM vaginal cream Place 1 Applicatorful vaginally daily as needed. 42.5 g 3   HYDROcodone-acetaminophen (NORCO/VICODIN) 5-325 MG tablet Take 1 tablet by mouth every 6 (six) hours as needed for moderate pain. 30 tablet 0   hyoscyamine (ANASPAZ) 0.125 MG TBDP disintergrating tablet Place 1 tablet (0.125 mg total) under the tongue daily as needed (IBS). Place 0.125 mg under the tongue daily as needed (IBS). 30 tablet 0   Lidocaine-Hydrocortisone Ace 3-2.5 % KIT APPLY TO RECTUM QID for 2 weeks then AS NEEDED FOR RECTAL PAIN OR BLEEDING 1 kit 11   meclizine (ANTIVERT) 12.5 MG tablet Take 1 tablet (12.5 mg total) by mouth 3 (three) times daily as needed for dizziness. (Patient taking differently: Take 12.5 mg by mouth 3 (three) times daily as needed (vertigo).) 30 tablet 3   Menthol, Topical Analgesic, (BIOFREEZE EX) Apply 1 application topically daily as needed (pain).     metroNIDAZOLE (METROCREAM) 0.75 % cream Apply 1 application. topically as needed. 45 g 3   naproxen (NAPROSYN) 500 MG tablet Take 1 tablet (500 mg total) by mouth daily as needed for moderate pain or mild pain. 30 tablet 0   neomycin-polymyxin b-dexamethasone (MAXITROL) 3.5-10000-0.1 OINT Place 1 application into both eyes at bedtime as needed. (Patient taking differently: Place 1 application. into both eyes at bedtime as needed (Dry eyes).) 10.5 g 3   nitroGLYCERIN (NITROLINGUAL) 0.4 MG/SPRAY spray Place 1 spray under the tongue every 5 (five) minutes as needed for chest pain. 12 g 3   NON  FORMULARY Take 1 tablet by mouth daily as needed (Stomach Bloat). FD GUARD     ondansetron (ZOFRAN) 4 MG tablet Take 1 tablet (4 mg total) by mouth every 8 (eight) hours as needed for nausea or vomiting. 30 tablet 1   prednisoLONE acetate (PRED FORTE) 1 % ophthalmic suspension Place 1 drop into the right eye 3 (three) times daily. (Patient not taking: Reported on 02/25/2022)     UNABLE TO FIND Place 1 application rectally daily as needed. [place]amps Name: lidocaine 5% pramoxine 1% ointment per rectum as needed.     Current Facility-Administered Medications  Medication Dose Route Frequency Provider Last Rate Last Admin   0.9 %  sodium chloride infusion  500 mL Intravenous Once Sharyn Creamer, MD        Allergies as of 02/25/2022 - Review Complete 02/25/2022  Allergen Reaction Noted   Nurtec [rimegepant sulfate] Anaphylaxis 01/16/2022   Nutmeg oil (myristica oil) Anaphylaxis 08/25/2018   Adhesive [tape] Other (See Comments) 03/10/2013   Aspirin Other (See Comments) 09/18/2011   Imitrex [sumatriptan] Hives 05/16/2021  Keflex [cephalexin] Other (See Comments) 11/26/2016   Levaquin [levofloxacin in d5w]  12/29/2016   Other  10/08/2017   Statins Other (See Comments) 02/28/2013   Sulfa antibiotics Other (See Comments) 12/10/2010   Sulfonamide derivatives Other (See Comments) 12/10/2010   Latex Rash 12/13/2011    Family History  Problem Relation Age of Onset   Colon cancer Mother        15s   Urticaria Mother    Heart disease Mother    Colon polyps Mother    Cancer Father        oral   Crohn's disease Sister    Cancer Sister        around heart   Colon polyps Sister    Diabetes Brother    Other Brother        intestinal sugery   Colon polyps Brother    Colon cancer Maternal Grandmother    Colon cancer Maternal Grandfather    Colon cancer Paternal Grandfather    Anesthesia problems Neg Hx     Social History   Socioeconomic History   Marital status: Widowed     Spouse name: Not on file   Number of children: 1   Years of education: Not on file   Highest education level: Not on file  Occupational History   Occupation: homemaker    Employer: UNEMPLOYED  Tobacco Use   Smoking status: Former    Packs/day: 0.50    Years: 30.00    Pack years: 15.00    Types: Cigarettes    Quit date: 10/15/1996    Years since quitting: 25.3   Smokeless tobacco: Never   Tobacco comments:    Stopped smoking ~ 2000  Vaping Use   Vaping Use: Never used  Substance and Sexual Activity   Alcohol use: Yes    Alcohol/week: 4.0 standard drinks    Types: 4 Glasses of wine per week    Comment: 1 drinks every couple of weeks   Drug use: No   Sexual activity: Yes    Birth control/protection: Surgical  Other Topics Concern   Not on file  Social History Narrative   Does not routinely exercise. Husband passed in JUN 2015 due to prostate ca.   Social Determinants of Health   Financial Resource Strain: Low Risk    Difficulty of Paying Living Expenses: Not hard at all  Food Insecurity: No Food Insecurity   Worried About Charity fundraiser in the Last Year: Never true   Arboriculturist in the Last Year: Never true  Transportation Needs: Public librarian (Medical): Yes   Lack of Transportation (Non-Medical): Yes  Physical Activity: Sufficiently Active   Days of Exercise per Week: 7 days   Minutes of Exercise per Session: 60 min  Stress: No Stress Concern Present   Feeling of Stress : Not at all  Social Connections: Socially Isolated   Frequency of Communication with Friends and Family: More than three times a week   Frequency of Social Gatherings with Friends and Family: Never   Attends Religious Services: Never   Marine scientist or Organizations: No   Attends Archivist Meetings: Never   Marital Status: Widowed  Human resources officer Violence: Not At Risk   Fear of Current or Ex-Partner: No   Emotionally Abused:  No   Physically Abused: No   Sexually Abused: No    Physical Exam: Vital signs in last 24 hours: BP 116/72  Pulse 83   Temp 97.8 F (36.6 C)   Ht 5' 7" (1.702 m)   Wt 218 lb (98.9 kg)   SpO2 98%   BMI 34.14 kg/m  GEN: NAD EYE: Sclerae anicteric ENT: MMM CV: Non-tachycardic Pulm: No increased WOB GI: Soft NEURO:  Alert & Oriented   [name], MD [facility]   02/25/2022 11:50 AM

## 2022-02-25 NOTE — Progress Notes (Deleted)
Called to room to assist during endoscopic procedure.  Patient ID and intended procedure confirmed with present staff. Received instructions for my participation in the procedure from the performing physician.  

## 2022-02-26 ENCOUNTER — Telehealth: Payer: Self-pay

## 2022-02-26 ENCOUNTER — Ambulatory Visit: Payer: Medicare Other | Admitting: Internal Medicine

## 2022-02-26 NOTE — Telephone Encounter (Signed)
  Follow up Call-     02/25/2022   10:29 AM  Call back number  Post procedure Call Back phone  # [phone]  Permission to leave phone message Yes     Patient questions:  Do you have a fever, pain , or abdominal swelling? No. Pain Score  0 *  Have you tolerated food without any problems? Yes.    Have you been able to return to your normal activities? Yes.    Do you have any questions about your discharge instructions: Diet   No. Medications  No. Follow up visit  No.  Do you have questions or concerns about your Care? No.  Actions: * If pain score is 4 or above: No action needed, pain <4.

## 2022-02-27 ENCOUNTER — Encounter: Payer: Self-pay | Admitting: Internal Medicine

## 2022-02-27 LAB — GI PROFILE, STOOL, PCR

## 2022-02-28 LAB — CLOSTRIDIUM DIFFICILE BY PCR: Toxigenic C. Difficile by PCR: NEGATIVE

## 2022-03-03 ENCOUNTER — Encounter: Payer: Self-pay | Admitting: Internal Medicine

## 2022-03-03 ENCOUNTER — Ambulatory Visit (INDEPENDENT_AMBULATORY_CARE_PROVIDER_SITE_OTHER): Payer: Medicare Other | Admitting: Internal Medicine

## 2022-03-03 VITALS — BP 152/90 | HR 85 | Ht 67.0 in | Wt 221.0 lb

## 2022-03-03 DIAGNOSIS — K2970 Gastritis, unspecified, without bleeding: Secondary | ICD-10-CM

## 2022-03-03 DIAGNOSIS — R197 Diarrhea, unspecified: Secondary | ICD-10-CM | POA: Diagnosis not present

## 2022-03-03 DIAGNOSIS — K219 Gastro-esophageal reflux disease without esophagitis: Secondary | ICD-10-CM

## 2022-03-03 LAB — OVA AND PARASITE EXAMINATION
CONCENTRATE RESULT:: NONE SEEN
MICRO NUMBER:: 13439579
SPECIMEN QUALITY:: ADEQUATE
TRICHROME RESULT:: NONE SEEN

## 2022-03-03 MED ORDER — DEXLANSOPRAZOLE 60 MG PO CPDR: 60.0000 mg | DELAYED_RELEASE_CAPSULE | Freq: Every day | ORAL | 3 refills | Status: DC

## 2022-03-03 NOTE — Patient Instructions (Signed)
Stop your colestipol and also the famotidine.  We have sent the following prescriptions to your mail in pharmacy: Dexilant  If you have not heard from your mail in pharmacy within 1 week or if you have not received your medication in the mail, please contact us at [phone] so we may find out why.   I appreciate the opportunity to care for you. [name], MD

## 2022-03-03 NOTE — Progress Notes (Signed)
Chief Complaint: Diarrhea, fecal incontinence  HPI : 71 year old female with history of colon polyps, asthma, PE, hypothyroidism, IBS, hemorrhoids s/p hemorrhoidectomy in 2017, prior anal fissure s/p sphincterotomy in 07/2021 presents with diarrhea and fecal incontinence.  Interval History: She states that her colestipol medication has not been helping with her symptoms. She is actually having ab pain after taking the medication.  Famotidine is not helping with her reflux symptoms.  For her reflux she is taking Dexilant as needed.  She is taking the Dexilant as needed because she is worried that she would develop C. difficile again if she was taking the Dexilant more consistently.  Her diarrhea is now significantly improved over time.  Her stools are now more formed in nature.  She has gained 9 pounds recently.  She has been trying to follow the low FODMAP diet.  Has been following with pelvic floor PT but has been having difficulty with arranging transportation for her visits.  Her nausea, vomiting, and dysphagia have all improved.  Wt Readings from Last 3 Encounters:  03/03/22 221 lb (100.2 kg)  02/25/22 218 lb (98.9 kg)  01/20/22 216 lb (98 kg)   Current Outpatient Medications  Medication Sig Dispense Refill   albuterol (PROVENTIL) (2.5 MG/3ML) 0.083% nebulizer solution Take 3 mLs (2.5 mg total) by nebulization every 4 (four) hours as needed for wheezing or shortness of breath. 150 mL 3   albuterol (VENTOLIN HFA) 108 (90 Base) MCG/ACT inhaler Inhale 2 puffs into the lungs every 4 (four) hours as needed for wheezing. 54 g 3   ALPRAZolam (XANAX) 1 MG tablet Take 1 tablet (1 mg total) by mouth 2 (two) times daily as needed for anxiety. (Patient taking differently: Take 0.5-1 mg by mouth at bedtime.) 180 tablet 3   brimonidine (ALPHAGAN) 0.2 % ophthalmic solution Place 1 drop into both eyes 2 (two) times daily. 15 mL 3   budesonide-formoterol (SYMBICORT) 160-4.5 MCG/ACT inhaler Inhale 2 puffs  into the lungs as needed. 10.2 g 3   chlorpheniramine-HYDROcodone (TUSSIONEX PENNKINETIC ER) 10-8 MG/5ML SUER Take 5 mLs by mouth every 12 (twelve) hours as needed. (Patient taking differently: Take 5 mLs by mouth every 12 (twelve) hours as needed for cough.) 140 mL 0   Cholecalciferol (VITAMIN D3) 50 MCG (2000 UT) TABS Take 2,000 Units by mouth daily.     colestipol (COLESTID) 1 g tablet Take 2 tablets (2 g total) by mouth 2 (two) times daily. 120 tablet 2   cyclobenzaprine (FLEXERIL) 10 MG tablet TAKE ONE TABLET BY MOUTH DAILY  as needed muscle spasms (Patient taking differently: Take 10 mg by mouth daily as needed (Arthritis and torn rotator cuff left).) 90 tablet 3   diazepam (VALIUM) 5 MG tablet Take one tablet by mouth with food one hour prior to procedure. May repeat 30 minutes prior if needed. 2 tablet 0   dicyclomine (BENTYL) 10 MG capsule Take one capsule before meals and at bedtime AS NEEDED for abdominal pain and frequent stool. HOLD for constipation (Patient taking differently: Take 10 mg by mouth daily as needed (IBS).) 120 capsule 1   DUREZOL 0.05 % EMUL Place 1 drop into the right eye daily.     estradiol (ESTRACE VAGINAL) 0.1 MG/GM vaginal cream Place 1 Applicatorful vaginally daily as needed. 42.5 g 3   famotidine (PEPCID) 20 MG tablet Take 1 tablet (20 mg total) by mouth 2 (two) times daily. 112 tablet 0   fenofibrate (TRICOR) 145 MG tablet Take 1 tablet (  145 mg total) by mouth daily. 90 tablet 3   HYDROcodone-acetaminophen (NORCO/VICODIN) 5-325 MG tablet Take 1 tablet by mouth every 6 (six) hours as needed for moderate pain. 30 tablet 0   hyoscyamine (ANASPAZ) 0.125 MG TBDP disintergrating tablet Place 1 tablet (0.125 mg total) under the tongue daily as needed (IBS). Place 0.125 mg under the tongue daily as needed (IBS). 30 tablet 0   irbesartan (AVAPRO) 150 MG tablet Take 0.5 tablets (75 mg total) by mouth daily. 45 tablet 3   latanoprost (XALATAN) 0.005 % ophthalmic solution Place  1 drop into both eyes at bedtime. 9 mL 3   levothyroxine (SYNTHROID) 25 MCG tablet Take 1 tablet (25 mcg total) by mouth daily before breakfast. 95 tablet 1   Lidocaine-Hydrocortisone Ace 3-2.5 % KIT APPLY TO RECTUM QID for 2 weeks then AS NEEDED FOR RECTAL PAIN OR BLEEDING 1 kit 11   Lifitegrast (XIIDRA) 5 % SOLN INSTILL 1 DROP IN BOTH EYES EVERY DAY AS NEEDED (EACH CONTAINER SHOULD YIELD 2 DROPS) STORE IN ORIGINAL FOIL POUCH (Patient taking differently: Place 1 drop into both eyes daily. (EACH CONTAINER SHOULD YIELD 2 DROPS) STORE IN ORIGINAL FOIL POUCH) 60 each 3   Magnesium 500 MG TABS Take 1 tablet by mouth daily.     meclizine (ANTIVERT) 12.5 MG tablet Take 1 tablet (12.5 mg total) by mouth 3 (three) times daily as needed for dizziness. (Patient taking differently: Take 12.5 mg by mouth 3 (three) times daily as needed (vertigo).) 30 tablet 3   Menthol, Topical Analgesic, (BIOFREEZE EX) Apply 1 application topically daily as needed (pain).     metroNIDAZOLE (METROCREAM) 0.75 % cream Apply 1 application. topically as needed. 45 g 3   naproxen (NAPROSYN) 500 MG tablet Take 1 tablet (500 mg total) by mouth daily as needed for moderate pain or mild pain. 30 tablet 0   neomycin-polymyxin b-dexamethasone (MAXITROL) 3.5-10000-0.1 OINT Place 1 application into both eyes at bedtime as needed. (Patient taking differently: Place 1 application. into both eyes at bedtime as needed (Dry eyes).) 10.5 g 3   nitroGLYCERIN (NITROLINGUAL) 0.4 MG/SPRAY spray Place 1 spray under the tongue every 5 (five) minutes as needed for chest pain. 12 g 3   NON FORMULARY Take 1 tablet by mouth daily as needed (Stomach Bloat). FD GUARD     ondansetron (ZOFRAN) 4 MG tablet Take 1 tablet (4 mg total) by mouth every 8 (eight) hours as needed for nausea or vomiting. 30 tablet 1   prednisoLONE acetate (PRED FORTE) 1 % ophthalmic suspension Place 1 drop into the right eye 3 (three) times daily.     Probiotic Product (PROBIOTIC DAILY  PO) Take 1 capsule by mouth daily. VSL #3     UNABLE TO FIND Place 1 application rectally daily as needed. Hope Name: lidocaine 5% pramoxine 1% ointment per rectum as needed.     zolpidem (AMBIEN) 10 MG tablet Take 1 tablet (10 mg total) by mouth at bedtime as needed for sleep. 90 tablet 0   No current facility-administered medications for this visit.   Review of Systems: All systems reviewed and negative except where noted in HPI.   Physical Exam: BP (!) 152/90   Pulse 85   Ht 5' 7"  (1.702 m)   Wt 221 lb (100.2 kg)   BMI 34.61 kg/m  Constitutional: Pleasant,well-developed, female in no acute distress. HEENT: Normocephalic and atraumatic. Conjunctivae are normal. No scleral icterus. Cardiovascular: Normal rate, regular rhythm.  Pulmonary/chest: Effort normal  and breath sounds normal. No wheezing, rales or rhonchi. Abdominal: Soft, nondistended, nontender. Bowel sounds active throughout. There are no masses palpable. No hepatomegaly. Extremities: No edema Neurological: Alert and oriented to person place and time. Skin: Skin is warm and dry. No rashes noted. Psychiatric: Normal mood and affect. Behavior is normal.  Labs 07/2012: TTG IGA negative.  Labs 09/2021: C dif toxin positive on GI profile. TSH nml. CMP with mildly elevated Cr of 1.17.   Labs 02/2022: GI profile positive for norovirus. C dif negative. O&P negative.  CT A/P w/contrast 12/30/20: IMPRESSION: 2 cm benign cyst in lower pole of right kidney, corresponding to the lesion seen on recent MRI. No evidence of renal neoplasm, urolithiasis, or hydronephrosis.  Moderate hepatic steatosis.  Aortic Atherosclerosis (ICD10-I70.0).  SBE 03/14/13: Mild gastritis.  Flex sig 03/18/18: - Anal fissure found on perianal exam. - Hemorrhoids found on perianal exam. - The examination was otherwise normal.  Colonoscopy 04/21/21: - Anal fissure found on perianal exam. - Non-bleeding internal hemorrhoids. -  Diverticulosis in the right colon. - One 2 mm polyp in the ascending colon, removed with a cold biopsy forceps. Resected and retrieved. - Three 4 to 6 mm polyps in the descending colon, removed with a cold snare. Resected and retrieved. Path: A. COLON, ASCENDING, POLYPECTOMY:  - Tubular adenoma.  - No high-grade dysplasia or carcinoma.  B. COLON, DESCENDING, POLYPECTOMY:  - Tubular adenoma (1).  - No high-grade dysplasia or carcinoma.  - Hyperplastic polyp (1).  EGD 02/25/22: - Normal esophagus. Biopsied. - Gastritis. Biopsied. - Two duodenal polyps. Resected and retrieved. Path: 1. Surgical [P], small bowel polyps - DUODENAL MUCOSA WITH PEPTIC INJURY AND BRUNNER GLAND HYPERPLASIA. - NO DYSPLASIA OR MALIGNANCY. 2. Surgical [P], gastric - ANTRAL AND OXYNTIC MUCOSA WITH HYPEREMIA. - NO HELICOBACTER PYLORI IDENTIFIED. 3. Surgical [P], esophageal - UNREMARKABLE SQUAMOUS MUCOSA. - NO EOSINOPHILIC ESOPHAGITIS.  ASSESSMENT AND PLAN: Diarrhea, resolved GERD History of C dif  Hepatic steatosis History of cholecystectomy Overall patient is doing quite well.  Her diarrhea has improved over time.  Based upon her stool test, I suspect that she likely had a norovirus infection that caused her to have an acute episode of diarrhea.  Her symptoms are now back to her baseline level of BMs.  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  Importantly we were able to rule out C. difficile as a contributor to her symptoms.  I encouraged her to continue to try the low FODMAP diet.  She did not respond well to the colestipol or famotidine so she would be okay to discontinue these medications.  Patient is taking Dexilant as needed since she has she is worried about developing recurrent C. difficile. - Low FODMAP diet - Continue daily fiber supplement - Okay to stop colestipol and famotidine - Will refill Dexilant - Patient is scheduled to continue seeing  pelvic floor PT - Repeat colonoscopy for polyp surveillance due in 04/2026 - RTC 3 months  [name], MD  I spent 42 minutes of time, including in depth chart review, independent review of results as outlined above, communicating results with the patient directly, face-to-face time with the patient, coordinating care, ordering studies and medications as appropriate, and documentation.

## 2022-03-04 LAB — PANCREATIC ELASTASE, FECAL: Pancreatic Elastase-1, Stool: >500 mcg/g

## 2022-03-11 ENCOUNTER — Ambulatory Visit: Payer: Medicare Other | Admitting: Physical Therapy

## 2022-03-11 ENCOUNTER — Ambulatory Visit: Payer: Medicare Other | Admitting: Gastroenterology

## 2022-03-12 ENCOUNTER — Ambulatory Visit (INDEPENDENT_AMBULATORY_CARE_PROVIDER_SITE_OTHER): Payer: Medicare Other | Admitting: Physical Medicine and Rehabilitation

## 2022-03-12 ENCOUNTER — Ambulatory Visit: Payer: Self-pay

## 2022-03-12 ENCOUNTER — Encounter: Payer: Self-pay | Admitting: Physical Medicine and Rehabilitation

## 2022-03-12 VITALS — BP 97/58 | HR 79

## 2022-03-12 DIAGNOSIS — M5416 Radiculopathy, lumbar region: Secondary | ICD-10-CM | POA: Diagnosis not present

## 2022-03-12 MED ORDER — METHYLPREDNISOLONE ACETATE 80 MG/ML IJ SUSP
80.0000 mg | Freq: Once | INTRAMUSCULAR | Status: AC
  Administered 2022-03-12: 80 mg

## 2022-03-12 NOTE — Patient Instructions (Signed)

## 2022-03-12 NOTE — Progress Notes (Signed)
Pt state lower back pain that travels to her right leg. Pt state walking, sitting and laying down makes the pain worse. Pt state takes over the counter pain meds to help ease her pain.  Numeric Pain Rating Scale and Functional Assessment Average Pain 2   In the last MONTH (on 0-10 scale) has pain interfered with the following?  1. General activity like being  able to carry out your everyday physical activities such as walking, climbing stairs, carrying groceries, or moving a chair?  Rating(10)   +Driver, -BT, -Dye Allergies.

## 2022-03-17 ENCOUNTER — Ambulatory Visit (INDEPENDENT_AMBULATORY_CARE_PROVIDER_SITE_OTHER): Payer: Medicare Other | Admitting: Orthopaedic Surgery

## 2022-03-17 ENCOUNTER — Encounter: Payer: Self-pay | Admitting: Orthopaedic Surgery

## 2022-03-17 DIAGNOSIS — M5416 Radiculopathy, lumbar region: Secondary | ICD-10-CM

## 2022-03-17 NOTE — Progress Notes (Signed)
She had the epidural in the lumbar spine and is doing much, much better.  She has little pain now.  Spine/Pelvis examination:  Inspection:  Overall, sacoiliac joint benign and hips nontender; without crepitus or defects.   Thoracic spine inspection: Alignment normal without kyphosis present   Lumbar spine inspection:  Alignment  with normal lumbar lordosis, without scoliosis apparent.   Thoracic spine palpation:  without tenderness of spinal processes   Lumbar spine palpation: without tenderness of lumbar area; without tightness of lumbar muscles    Range of Motion:   Lumbar flexion, forward flexion is normal without pain or tenderness    Lumbar extension is full without pain or tenderness   Left lateral bend is normal without pain or tenderness   Right lateral bend is normal without pain or tenderness   Straight leg raising is normal  Strength & tone: normal   Stability overall normal stability  Encounter Diagnosis  Name Primary?   Lumbar radiculopathy Yes   I will see her as needed.  She is going [place]  I went over some precautions with her.  Call if any problem.  Precautions discussed.  Electronically Signed Sanjuana Kava, MD 6/13/202310:29 AM

## 2022-03-25 ENCOUNTER — Ambulatory Visit: Payer: Medicare Other | Attending: Internal Medicine | Admitting: Physical Therapy

## 2022-03-25 DIAGNOSIS — M62838 Other muscle spasm: Secondary | ICD-10-CM | POA: Diagnosis not present

## 2022-03-25 DIAGNOSIS — R293 Abnormal posture: Secondary | ICD-10-CM | POA: Insufficient documentation

## 2022-03-25 DIAGNOSIS — R279 Unspecified lack of coordination: Secondary | ICD-10-CM | POA: Insufficient documentation

## 2022-03-25 DIAGNOSIS — M6281 Muscle weakness (generalized): Secondary | ICD-10-CM | POA: Insufficient documentation

## 2022-03-25 NOTE — Therapy (Signed)
OUTPATIENT PHYSICAL THERAPY FEMALE PELVIC TREATMENT   Patient Name: [name] MRN: [id number] DOB:09-Sep-1951, 71 y.o., female Today's Date: 03/25/2022   PT End of Session - 03/25/22 1141     Visit Number 2    Date for PT Re-Evaluation 05/14/22    Authorization Type medicare A and B    PT Start Time 1145    PT Stop Time 1225    PT Time Calculation (min) 40 min    Activity Tolerance No increased pain    Behavior During Therapy WFL for tasks assessed/performed             Past Medical History:  Diagnosis Date   Anal fissure    Anxiety    Arthritis    Asthma    C. difficile diarrhea 09/17/2021   Cataract    Phreesia 01/05/2021   Chest pain    a. 02/2000 Cath: nl cors, EF 60%;  b. 10/2010 MV: nl LV, no ischemia/infarct;  c. 03/2014 Admit c/p, r/o->grief from husbands death.   Chronic RUQ pain 12-18-07   EUS slightly dilated CBD (7.71m), otherwise nl   Depression    Diverticulosis    Eczema    Exposure to chemical inhalation mid 1970s   Fatty liver    G6PD deficiency    Gallstones    GERD (gastroesophageal reflux disease)    Glaucoma    History of pulmonary embolus (PE)    Treated with Eliquis 2Mar 14, 2016  HTN (hypertension)    Hypercholesteremia    a. intolerant to statins.   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 She has limited vision in the right eye.   Migraines    inner ocular   Ocular migraine    Palpitations    Pleurisy    Pneumonia 07/16/2021   Patient was exposed to a harsh chemical in the 1970's that created scar tissue in her lungs. She has had pneumonia several times in the past.   PONV (postoperative nausea and vomiting)    Pulmonary emboli (HCC)    Recurrent upper respiratory infection (URI)    Renal cyst    Retinal cyst    Sickle cell trait (HCC)    Tubular adenoma of colon 09/17/2011   Urticaria    Past Surgical History:  Procedure Laterality Date    ABDOMINAL HYSTERECTOMY     ADENOIDECTOMY     ANGIOPLASTY     APPENDECTOMY     BIOPSY N/A 03/14/2013   Procedure: SMALL BOWEL AND GASTRIC BIOPSIES (Procedure #1);  Surgeon: SDanie Binder MD;  Location: [facility];  Service: Endoscopy;  Laterality: N/A;   CARDIAC CATHETERIZATION Left 02/11/2000   CATARACT EXTRACTION Left    CATARACT EXTRACTION W/PHACO Right 03/19/2021   Procedure: CATARACT EXTRACTION PHACO AND INTRAOCULAR LENS PLACEMENT (ITrainer RIGHT 6.75 00:50.4;  Surgeon: BLeandrew Koyanagi MD;  [facility]  Service: Ophthalmology;  Laterality: Right;   CHOLECYSTECTOMY  12/2007   COLONOSCOPY  12/22/2010   RHCW:CBJSEG cecal adenomatous polyp   COLONOSCOPY WITH PROPOFOL N/A 03/31/2016   Dr. FOneida Alar four sessile polyps rectum/sigmoid colon, diverticulosi, ext/int hemorrhoids. hyperplastic polyps, next tcs 5 years.   COLONOSCOPY WITH PROPOFOL N/A 04/21/2021   anal fissure, non-bleeding internal hemorrhoids, right colon diverticulosis, one 2 mm polyp in ascending, three 4-6 mm polyps in descending colon. Tubular adenomas. 5 year surveillance.   complete hysterectomy  ENTEROSCOPY N/A 03/14/2013   MHD:QQIW gastritis/ulcers has healed   ESOPHAGOGASTRODUODENOSCOPY  09/22/2011   LNL:GXQJ gastritis/Duodenitis   EXCISIONAL HEMORRHOIDECTOMY     FLEXIBLE SIGMOIDOSCOPY N/A 03/14/2013   SLF:3 colon polyp removed/moderate sized internal hemorrhoids   FLEXIBLE SIGMOIDOSCOPY N/A 06/09/2016   Procedure: FLEXIBLE SIGMOIDOSCOPY;  Surgeon: Danie Binder, MD;  [facility]: [facility];  Service: Endoscopy;  Laterality: N/A;  rectal polyps times 2   FLEXIBLE SIGMOIDOSCOPY N/A 03/18/2018   Procedure: FLEXIBLE SIGMOIDOSCOPY;  Surgeon: Danie Binder, MD;  [facility]: [facility];  Service: Endoscopy;  Laterality: N/A;  9:15am   FOOT SURGERY     bunion removal left   GIVENS CAPSULE STUDY N/A 02/10/2013   Procedure: GIVENS CAPSULE STUDY;  Surgeon: Danie Binder, MD;  [facility];  Service: Endoscopy;  Laterality: N/A;  [place]     right    HEMORRHOID BANDING N/A 03/14/2013   Procedure: HEMORRHOID BANDING (Procedure #3)  3 bands applied [id number]#[id number] Exp 02/02/2014 ;  Surgeon: Danie Binder, MD;  Location: [facility];  Service: Endoscopy;  Laterality: N/A;   HEMORRHOID SURGERY N/A 04/24/2016   Procedure: EXTENSIVE HEMORRHOIDECTOMY;  Surgeon: Vickie Epley, MD;  Location: [facility];  Service: General;  Laterality: N/A;   LAPAROSCOPY     adhesions   POLYPECTOMY N/A 03/14/2013   YJE:HUDJ Gastritis . ULCERS SEEN ON MAY 6 HAVE HEALED   POLYPECTOMY  03/31/2016   Procedure: POLYPECTOMY;  Surgeon: Danie Binder, MD;  Location: [facility];  Service: Endoscopy;;  sigmoid colon polyps x2, rectal polyps x2   POLYPECTOMY  04/21/2021   Procedure: POLYPECTOMY;  Surgeon: Eloise Harman, DO;  Location: [facility];  Service: Endoscopy;;   RECTAL EXAM UNDER ANESTHESIA N/A 07/23/2021   Procedure: RECTAL EXAM UNDER ANESTHESIA;  Surgeon: Leighton Ruff, MD;  Location: [facility];  Service: General;  Laterality: N/A;   SPHINCTEROTOMY N/A 07/23/2021   Procedure: CHEMICAL SPHINCTEROTOMY;  Surgeon: Leighton Ruff, MD;  Location: [place];  Service: General;  Laterality: N/A;   TONSILLECTOMY     TUBAL LIGATION     Patient Active Problem List   Diagnosis Date Noted   Adjustment disorder 09/09/2021   Ocular migraine 05/17/2021   Encounter for home safety review for injury prevention 05/17/2021   Family history of colon cancer 02/18/2021   Atypical chest pain 01/08/2021   Chronic back pain 07/22/2020   Spinal stenosis at L4-L5 level 07/22/2020   Hypothyroidism 05/10/2020   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 04/02/2020   Primary open angle glaucoma (POAG) of both eyes 01/12/2020   Generalized OA 01/12/2020   Arthritis of carpometacarpal ([place]) joint of left thumb 09/20/2018   Injury of digital nerve of left little finger 09/20/2018    Chronic posterior anal fissure 09/03/2016   Rosacea 09/04/2015   Constipation    PE (pulmonary thromboembolism) ([place]) 07/26/2015   DDD (degenerative disc disease), lumbosacral 02/13/2015   Depression 02/12/2015   Vaginal atrophy 11/14/2014   Hearing loss 11/15/2013   Knee pain 09/05/2013   Lung nodule 08/14/2013   Nodule of tendon sheath 08/14/2013   Hemorrhoids, internal, with bleeding 04/05/2013   Hyperlipidemia 49/70/2637   Nonalcoholic steatohepatitis ([facility]) 02/28/2013   Essential hypertension, benign 02/07/2013   Class 1 obesity due to excess calories with serious comorbidity and body mass index (BMI) of 33.0 to 33.9 in adult 02/07/2013   GERD (gastroesophageal reflux disease) 02/07/2013   Rectal bleeding 11/02/2012  CKD (chronic kidney disease), stage II 09/21/2012   Asthma 08/25/2012   Incontinence overflow, stress female 08/25/2012   Anxiety 08/25/2012   Chronic insomnia 08/25/2012   Irritable bowel syndrome 12/10/2010    PCP: Lindell Spar, MD  REFERRING PROVIDER: Sharyn Creamer, MD  REFERRING DIAG:  R19.7 (ICD-10-CM) - Diarrhea, unspecified type  R15.9 (ICD-10-CM) - Incontinence of feces, unspecified fecal incontinence type  K76.0 (ICD-10-CM) - Hepatic steatosis  R14.0 (ICD-10-CM) - Bloating  R13.10 (ICD-10-CM) - Dysphagia, unspecified type  Z86.19 (ICD-10-CM) - History of Clostridioides difficile infection  Z90.49 (ICD-10-CM) - History of cholecystectomy  R11.2 (ICD-10-CM) - Nausea and vomiting, unspecified vomiting type    THERAPY DIAG:  Muscle weakness (generalized)  Unspecified lack of coordination  Other muscle spasm  Abnormal posture  ONSET DATE: 2015  SUBJECTIVE:                                                                                                                                                                                           SUBJECTIVE STATEMENT: Pt reports her fecal incontinence has gotten much better since eval no  longer having this. Pt reports her urinary urgency and leakage has also greatly improved, only having small urinary incontinence however pt reports she was on a pain medication after surgery. Put this was the only time.  Pt had a spinal injection 03/12/22 and reports this has greatly improved pain and mobility since this.  Fluid intake: Yes: 6-7 20oz water bottles    Patient confirms identification and approves PT to assess pelvic floor and treatment Yes   PAIN:  Are you having pain? Yes NPRS scale: 5/10 Pain location:  anterior pelvic pain  Pain type: aching and cramping Pain description: constant    PRECAUTIONS: Other: allergic to tape and latex, low vision  WEIGHT BEARING RESTRICTIONS No  FALLS:  Has patient fallen in last 6 months? No  LIVING ENVIRONMENT: Lives with: lives with their family and lives alone Lives in: House/apartment   OCCUPATION: not working  PLOF: Independent  PATIENT GOALS to have less pain and deceased leakage  PERTINENT HISTORY:  colon polyps, asthma, PE, hypothyroidism, IBS, hemorrhoids s/p hemorrhoidectomy in 2017, prior anal fissure s/p sphincterotomy in 07/2021 presents with diarrhea and fecal incontinence. Sexual abuse: No  BOWEL MOVEMENT Pain with bowel movement: Yes Type of bowel movement:Type (Bristol Stool Scale) 4-6, Frequency varies 4-8 times per day, and Strain Yes Fully empty rectum: No Leakage: Yes: based on diet per pt, can't stop when she feels something and seems like she is pushing out Pads: Yes: sometimes Fiber supplement: Yes: Probiotic which helps, without this stool is  type 7  URINATION Pain with urination: Yes after finished urinating, pt feels like her urethra is being stretched Fully empty bladder: No sometimes feels like she can but sometimes has strong urge and not a lot empties Stream: Strong Urgency: Yes:   Frequency: can be 3 hours but sometimes every 45 mins Leakage:  after urination needs to wait to make sure  nothing else empties  and feels like she needs to push to be done.  Pads: No  INTERCOURSE Pain with intercourse:  not active   PREGNANCY Vaginal deliveries 1 Tearing Yes: episiotomy  C-section deliveries  0 Currently pregnant No  PROLAPSE None    OBJECTIVE:   DIAGNOSTIC FINDINGS:    COGNITION:  Overall cognitive status: Within functional limits for tasks assessed     SENSATION:  Light touch: Appears intact  Proprioception: Appears intact  MUSCLE LENGTH: Bil hamstrings and adductors limited by 50%  GAIT: Distance walked: 300' Assistive device utilized: None Level of assistance: Modified independence Comments: decreased stride length, cadence, decreased step height bil  POSTURE:  Rounded shoulders and posterior pelvic tilt  LUMBARAROM/PROM  Limited in all directions by 25%  LE ROM:  WFL  LE MMT:  Bil hips grossly 3/5  PELVIC MMT:   MMT  02/11/22  Vaginal 4/5; 10s; 6 reps  Internal Anal Sphincter   External Anal Sphincter   Puborectalis   Diastasis Recti   (Blank rows = not tested)        PALPATION:   General  TTP throughout abdomen with fascial restrictions in all quadrants                External Perineal Exam TTP at Rt anterior pelvis, over pubic symphysis, Rt proximal adductor and medial glute and bil bulbocavernosus.                              Internal Pelvic Floor TTP throughout but worse at Rt side  TONE: Increased   PROLAPSE: Not seen in hooklying   TODAY'S TREATMENT   03/25/22: Pt educated on HEP and how to access on app as pt wants to have this when traveling 3x30s bil hamstring stretch in sitting 3x30s seated happy baby Supine single leg butterfly 3x30s 2x30s unilateral knee to chest X10 gentle lumbar rotations X10 diaphragmatic breathing in supine    02/11/2022 EVAL Examination completed, findings reviewed, pt educated on POC, HEP, and voiding mechanics. Pt motivated to participate in PT and agreeable to attempt  recommendations.     PATIENT EDUCATION:  Education details: [phone] Person educated: Patient Education method: Explanation, Demonstration, Tactile cues, Verbal cues, and Handouts Education comprehension: verbalized understanding and returned demonstration   HOME EXERCISE PROGRAM: [id number]  ASSESSMENT:  CLINICAL IMPRESSION: Patient has had great improvement with pelvic floor symptoms and has only had one urinary leakage since eval and no fecal leakage. Pt session focused on education for HEP and continuing mobility as tolerated and stretching at spine, hips, and BLE for decreased tension at pelvic floor. Pt would benefit from additional PT to further address deficits.     OBJECTIVE IMPAIRMENTS decreased coordination, decreased endurance, decreased mobility, decreased strength, increased fascial restrictions, increased muscle spasms, impaired flexibility, impaired vision/preception, improper body mechanics, postural dysfunction, and pain.   ACTIVITY LIMITATIONS cleaning, community activity, and yard work.   PERSONAL FACTORS Past/current experiences, Time since onset of injury/illness/exacerbation, and 3+ comorbidities: complex medical history  are also affecting patient's functional outcome.  REHAB POTENTIAL: Good  CLINICAL DECISION MAKING: Evolving/moderate complexity  EVALUATION COMPLEXITY: Moderate   GOALS: Goals reviewed with patient? Yes  SHORT TERM GOALS: Target date: 03/11/2022  (Remove Blue Hyperlink)  Pt to be I with HEP. Baseline: Goal status: INITIAL  2.  Pt to demonstrate improved coordination with pelvic floor with contract/relax/bulge for improved mechanics and decreased tension at pelvic floor for less pain.  Baseline:  Goal status: INITIAL  3.  Pt to be I with recall of voiding and breathing mechanics to improve bowel and urination habits.  Baseline:  Goal status: INITIAL    LONG TERM GOALS: Target date: 05/14/2022  (Remove Blue Hyperlink)  Pt to be  I with advanced HEP.  Baseline:  Goal status: INITIAL  2.  Pt will report her BMs are complete due to improved bowel habits and evacuation techniques.  Baseline:  Goal status: INITIAL  3.  Pt will be able to functional actions such as walking 15 mins without leakage  Baseline:  Goal status: INITIAL  4.  Pt will report more regular bowel movements to no more than 3 per day due to improved muscle tone and coordination with BMs.  Baseline:  Goal status: INITIAL  5.  Pt to demonstrate at least 5/5 pelvic floor strength for improved pelvic stability and decreased strain at pelvic floor/ decrease leakage and ability to consistently relax fully without compensatory strategies between contractions.  Baseline:  Goal status: INITIAL  6.  Pt to demonstrate at least 4+/5 bil hip strength for improved pelvic stability and functional squats without leakage and improved coordination of for pressure management.  Baseline:  Goal status: INITIAL  PLAN: PT FREQUENCY:  every other week   PT DURATION:  6 sessions  PLANNED INTERVENTIONS: Therapeutic exercises, Therapeutic activity, Neuromuscular re-education, Patient/Family education, Joint mobilization, Aquatic Therapy, Dry Needling, Spinal mobilization, Cryotherapy, Moist heat, Manual lymph drainage, scar mobilization, Biofeedback, and Manual therapy  PLAN FOR NEXT SESSION: stretching, core activations, breathing mechanics, abdominal massage  Stacy Gardner, PT, DPT 06/21/231:09 PM

## 2022-03-27 NOTE — Progress Notes (Signed)
[name] - 71 y.o. female MRN [id number]  Date of birth: 1951-01-20  Office Visit Note: Visit Date: 03/12/2022 PCP: Anabel Halon, MD Referred by: Anabel Halon, MD  Subjective: Chief Complaint  Patient presents with   Lower Back - Pain   Right Leg - Pain   HPI:  [name] is a 71 y.o. female who comes in today at the request of Ellin Goodie, FNP for planned Right L4-5 Lumbar Interlaminar epidural steroid injection with fluoroscopic guidance.  The patient has failed conservative care including home exercise, medications, time and activity modification.  This injection will be diagnostic and hopefully therapeutic.  Please see requesting physician notes for further details and justification.   ROS Otherwise per HPI.  Assessment & Plan: Visit Diagnoses:    ICD-10-CM   1. Lumbar radiculopathy  M54.16 XR C-ARM NO REPORT    Epidural Steroid injection    methylPREDNISolone acetate (DEPO-MEDROL) injection 80 mg      Plan: No additional findings.   Meds & Orders:  Meds ordered this encounter  Medications   methylPREDNISolone acetate (DEPO-MEDROL) injection 80 mg    Orders Placed This Encounter  Procedures   XR C-ARM NO REPORT   Epidural Steroid injection    Follow-up: Return if symptoms worsen or fail to improve.   Procedures: No procedures performed  Lumbar Epidural Steroid Injection - Interlaminar Approach with Fluoroscopic Guidance  Patient: [name]      Date of Birth: 1951/05/20 MRN: [id number] PCP: Anabel Halon, MD      Visit Date: 03/12/2022   Universal Protocol:     Consent Given By: the patient  Position: PRONE  Additional Comments: Vital signs were monitored before and after the procedure. Patient was prepped and draped in the usual sterile fashion. The correct patient, procedure, and site was verified.   Injection Procedure Details:   Procedure diagnoses: Lumbar radiculopathy [M54.16]   Meds Administered:  Meds ordered  this encounter  Medications   methylPREDNISolone acetate (DEPO-MEDROL) injection 80 mg     Laterality: Right  Location/Site:  L4-5  Needle: 3.5 in., 20 ga. Tuohy  Needle Placement: Paramedian epidural  Findings:   -Comments: Excellent flow of contrast into the epidural space.  Procedure Details: Using a paramedian approach from the side mentioned above, the region overlying the inferior lamina was localized under fluoroscopic visualization and the soft tissues overlying this structure were infiltrated with 4 ml. of 1% Lidocaine without Epinephrine. The Tuohy needle was inserted into the epidural space using a paramedian approach.   The epidural space was localized using loss of resistance along with counter oblique bi-planar fluoroscopic views.  After negative aspirate for air, blood, and CSF, a 2 ml. volume of Isovue-250 was injected into the epidural space and the flow of contrast was observed. Radiographs were obtained for documentation purposes.    The injectate was administered into the level noted above.   Additional Comments:  The patient tolerated the procedure well Dressing: 2 x 2 sterile gauze and Band-Aid    Post-procedure details: Patient was observed during the procedure. Post-procedure instructions were reviewed.  Patient left the clinic in stable condition.   Clinical History: MRI LUMBAR SPINE WITHOUT CONTRAST   TECHNIQUE: Multiplanar, multisequence MR imaging of the lumbar spine was performed. No intravenous contrast was administered.   COMPARISON:  09/19/2018   FINDINGS: Segmentation:  Standard.   Alignment:  Unchanged grade 1 anterolisthesis at L5-S1.   Vertebrae:  No fracture, evidence of  discitis, or bone lesion.   Conus medullaris and cauda equina: Conus extends to the L1 level. Conus and cauda equina appear normal.   Paraspinal and other soft tissues: Negative.   Disc levels:   L1-L2: Normal disc space and facet joints. No spinal canal  stenosis. No neural foraminal stenosis.   L2-L3: Normal disc space and facet joints. No spinal canal stenosis. No neural foraminal stenosis.   L3-L4: Increased mild disc bulge. Mild spinal canal stenosis. No neural foraminal stenosis.   L4-L5: Unchanged moderate disc bulge and facet hypertrophy. Moderate spinal canal stenosis. Mild left neural foraminal stenosis.   L5-S1: Unchanged severe facet hypertrophy. No spinal canal stenosis. No neural foraminal stenosis.   Visualized sacrum: Normal.   IMPRESSION: 1. Unchanged moderate L4-5 spinal canal stenosis and mild left neural foraminal stenosis. 2. Increased mild L3-4 spinal canal stenosis. 3. Unchanged severe L5-S1 facet arthrosis.     Electronically Signed   By: Deatra Robinson M.D.   On: 01/22/2022 03:45     Objective:  VS:  HT:    WT:   BMI:     BP:(!) 97/58  HR:79bpm  TEMP: ( )  RESP:  Physical Exam Vitals and nursing note reviewed.  Constitutional:      General: She is not in acute distress.    Appearance: Normal appearance. She is obese. She is not ill-appearing.  HENT:     Head: Normocephalic and atraumatic.     Right Ear: External ear normal.     Left Ear: External ear normal.  Eyes:     Extraocular Movements: Extraocular movements intact.  Cardiovascular:     Rate and Rhythm: Normal rate.     Pulses: Normal pulses.  Pulmonary:     Effort: Pulmonary effort is normal. No respiratory distress.  Abdominal:     General: There is no distension.     Palpations: Abdomen is soft.  Musculoskeletal:        General: Tenderness present.     Cervical back: Neck supple.     Right lower leg: No edema.     Left lower leg: No edema.     Comments: Patient has good distal strength with no pain over the greater trochanters.  No clonus or focal weakness.  Skin:    Findings: No erythema, lesion or rash.  Neurological:     General: No focal deficit present.     Mental Status: She is alert and oriented to person, place,  and time.     Sensory: No sensory deficit.     Motor: No weakness or abnormal muscle tone.     Coordination: Coordination normal.  Psychiatric:        Mood and Affect: Mood normal.        Behavior: Behavior normal.      Imaging: No results found.

## 2022-04-02 ENCOUNTER — Other Ambulatory Visit: Payer: Self-pay | Admitting: *Deleted

## 2022-04-02 DIAGNOSIS — I10 Essential (primary) hypertension: Secondary | ICD-10-CM

## 2022-04-27 ENCOUNTER — Other Ambulatory Visit (HOSPITAL_COMMUNITY)
Admission: RE | Admit: 2022-04-27 | Discharge: 2022-04-27 | Disposition: A | Discharge status: Home / Self Care | Payer: Medicare Other | Source: Ambulatory Visit | Attending: "Endocrinology | Admitting: "Endocrinology

## 2022-04-27 DIAGNOSIS — E039 Hypothyroidism, unspecified: Secondary | ICD-10-CM | POA: Insufficient documentation

## 2022-04-27 LAB — COMPREHENSIVE METABOLIC PANEL
ALT: 36 U/L (ref 0–44)
AST: 65 U/L — ABNORMAL HIGH (ref 15–41)
Albumin: 4.1 g/dL (ref 3.5–5.0)
Alkaline Phosphatase: 33 U/L — ABNORMAL LOW (ref 38–126)
Anion gap: 7 (ref 5–15)
BUN: 14 mg/dL (ref 8–23)
CO2: 22 mmol/L (ref 22–32)
Calcium: 9.3 mg/dL (ref 8.9–10.3)
Chloride: 107 mmol/L (ref 98–111)
Creatinine, Ser: 1.1 mg/dL — ABNORMAL HIGH (ref 0.44–1.00)
GFR, Estimated: 54 mL/min — ABNORMAL LOW (ref >60)
Glucose, Bld: 130 mg/dL — ABNORMAL HIGH (ref 70–99)
Potassium: 3.6 mmol/L (ref 3.5–5.1)
Sodium: 136 mmol/L (ref 135–145)
Total Bilirubin: 1.4 mg/dL — ABNORMAL HIGH (ref 0.3–1.2)
Total Protein: 7.3 g/dL (ref 6.5–8.1)

## 2022-04-27 LAB — MAGNESIUM: Magnesium: 1.6 mg/dL — ABNORMAL LOW (ref 1.7–2.4)

## 2022-04-27 LAB — T4, FREE: Free T4: 1.04 ng/dL (ref 0.61–1.12)

## 2022-04-27 LAB — TSH: TSH: 2.306 u[IU]/mL (ref 0.350–4.500)

## 2022-04-29 ENCOUNTER — Ambulatory Visit: Payer: Medicare Other | Attending: Internal Medicine | Admitting: Physical Therapy

## 2022-04-29 DIAGNOSIS — M6281 Muscle weakness (generalized): Secondary | ICD-10-CM | POA: Insufficient documentation

## 2022-04-29 DIAGNOSIS — R279 Unspecified lack of coordination: Secondary | ICD-10-CM | POA: Insufficient documentation

## 2022-04-29 DIAGNOSIS — R293 Abnormal posture: Secondary | ICD-10-CM | POA: Diagnosis not present

## 2022-04-29 NOTE — Therapy (Signed)
OUTPATIENT PHYSICAL THERAPY FEMALE PELVIC TREATMENT   Patient Name: [name] MRN: [id number] DOB:[birth year], 71 y.o., female Today's Date: 04/29/2022   PT End of Session - 04/29/22 1230-11-29     Visit Number 3    Date for PT Re-Evaluation 05/14/22    Authorization Type medicare A and B    PT Start Time 1230    PT Stop Time 1310    PT Time Calculation (min) 40 min    Activity Tolerance No increased pain    Behavior During Therapy WFL for tasks assessed/performed             Past Medical History:  Diagnosis Date   Anal fissure    Anxiety    Arthritis    Asthma    C. difficile diarrhea 09/17/2021   Cataract    Phreesia 01/05/2021   Chest pain    a. 02/2000 Cath: nl cors, EF 60%;  b. 10/2010 MV: nl LV, no ischemia/infarct;  c. 03/2014 Admit c/p, r/o->grief from husbands death.   Chronic RUQ pain November 30, 2007   EUS slightly dilated CBD (7.36m), otherwise nl   Depression    Diverticulosis    Eczema    Exposure to chemical inhalation mid 1970s   Fatty liver    G6PD deficiency    Gallstones    GERD (gastroesophageal reflux disease)    Glaucoma    History of pulmonary embolus (PE)    Treated with Eliquis 2Feb 25, 2016  HTN (hypertension)    Hypercholesteremia    a. intolerant to statins.   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 She has limited vision in the right eye.   Migraines    inner ocular   Ocular migraine    Palpitations    Pleurisy    Pneumonia 07/16/2021   Patient was exposed to a harsh chemical in the 1970's that created scar tissue in her lungs. She has had pneumonia several times in the past.   PONV (postoperative nausea and vomiting)    Pulmonary emboli (HCC)    Recurrent upper respiratory infection (URI)    Renal cyst    Retinal cyst    Sickle cell trait (HCC)    Tubular adenoma of colon 09/17/2011   Urticaria    Past Surgical History:  Procedure Laterality Date    ABDOMINAL HYSTERECTOMY     ADENOIDECTOMY     ANGIOPLASTY     APPENDECTOMY     BIOPSY N/A 03/14/2013   Procedure: SMALL BOWEL AND GASTRIC BIOPSIES (Procedure #1);  Surgeon: SDanie Binder MD;  Location: [facility];  Service: Endoscopy;  Laterality: N/A;   CARDIAC CATHETERIZATION Left 02/11/2000   CATARACT EXTRACTION Left    CATARACT EXTRACTION W/PHACO Right 03/19/2021   Procedure: CATARACT EXTRACTION PHACO AND INTRAOCULAR LENS PLACEMENT ([place] RIGHT 6.75 00:50.4;  Surgeon: BLeandrew Koyanagi MD;  Location: [facility]  Service: Ophthalmology;  Laterality: Right;   CHOLECYSTECTOMY  12/2007   COLONOSCOPY  12/22/2010   RYBO:FBPZWC cecal adenomatous polyp   COLONOSCOPY WITH PROPOFOL N/A 03/31/2016   Dr. FOneida Alar four sessile polyps rectum/sigmoid colon, diverticulosi, ext/int hemorrhoids. hyperplastic polyps, next tcs 5 years.   COLONOSCOPY WITH PROPOFOL N/A 04/21/2021   anal fissure, non-bleeding internal hemorrhoids, right colon diverticulosis, one 2 mm polyp in ascending, three 4-6 mm polyps in descending colon. Tubular adenomas. 5 year surveillance.   complete hysterectomy  ENTEROSCOPY N/A 03/14/2013   WFU:XNAT gastritis/ulcers has healed   ESOPHAGOGASTRODUODENOSCOPY  09/22/2011   FTD:DUKG gastritis/Duodenitis   EXCISIONAL HEMORRHOIDECTOMY     FLEXIBLE SIGMOIDOSCOPY N/A 03/14/2013   SLF:3 colon polyp removed/moderate sized internal hemorrhoids   FLEXIBLE SIGMOIDOSCOPY N/A 06/09/2016   Procedure: FLEXIBLE SIGMOIDOSCOPY;  Surgeon: Danie Binder, MD;  [facility];  Service: Endoscopy;  Laterality: N/A;  rectal polyps times 2   FLEXIBLE SIGMOIDOSCOPY N/A 03/18/2018   Procedure: FLEXIBLE SIGMOIDOSCOPY;  Surgeon: Danie Binder, MD;  [facility];  Service: Endoscopy;  Laterality: N/A;  9:15am   FOOT SURGERY     bunion removal left   GIVENS CAPSULE STUDY N/A 02/10/2013   Procedure: GIVENS CAPSULE STUDY;  Surgeon: Danie Binder, MD;  [facility];  Service: Endoscopy;  Laterality: N/A;  [place]     right    HEMORRHOID BANDING N/A 03/14/2013   Procedure: HEMORRHOID BANDING (Procedure #3)  3 bands applied [id number]#[id number] Exp 02/02/2014 ;  Surgeon: Danie Binder, MD;  Location: [facility];  Service: Endoscopy;  Laterality: N/A;   HEMORRHOID SURGERY N/A 04/24/2016   Procedure: EXTENSIVE HEMORRHOIDECTOMY;  Surgeon: Vickie Epley, MD;  Location: [facility];  Service: General;  Laterality: N/A;   LAPAROSCOPY     adhesions   POLYPECTOMY N/A 03/14/2013   EGB:TDVV Gastritis . ULCERS SEEN ON MAY 6 HAVE HEALED   POLYPECTOMY  03/31/2016   Procedure: POLYPECTOMY;  Surgeon: Danie Binder, MD;  [facility];  Service: Endoscopy;;  sigmoid colon polyps x2, rectal polyps x2   POLYPECTOMY  04/21/2021   Procedure: POLYPECTOMY;  Surgeon: Eloise Harman, DO;  [facility];  Service: Endoscopy;;   RECTAL EXAM UNDER ANESTHESIA N/A 07/23/2021   Procedure: RECTAL EXAM UNDER ANESTHESIA;  Surgeon: Leighton Ruff, MD;  Location: [facility];  Service: General;  Laterality: N/A;   SPHINCTEROTOMY N/A 07/23/2021   Procedure: CHEMICAL SPHINCTEROTOMY;  Surgeon: Leighton Ruff, MD;  Location: [facility];  Service: General;  Laterality: N/A;   TONSILLECTOMY     TUBAL LIGATION     Patient Active Problem List   Diagnosis Date Noted   Adjustment disorder 09/09/2021   Ocular migraine 05/17/2021   Encounter for home safety review for injury prevention 05/17/2021   Family history of colon cancer 02/18/2021   Atypical chest pain 01/08/2021   Chronic back pain 07/22/2020   Spinal stenosis at L4-L5 level 07/22/2020   Hypothyroidism 05/10/2020   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 04/02/2020   Primary open angle glaucoma (POAG) of both eyes 01/12/2020   Generalized OA 01/12/2020   Arthritis of carpometacarpal ([place]) joint of left thumb 09/20/2018   Injury of digital nerve of left little finger 09/20/2018    Chronic posterior anal fissure 09/03/2016   Rosacea 09/04/2015   Constipation    PE (pulmonary thromboembolism) ([place]) 07/26/2015   DDD (degenerative disc disease), lumbosacral 02/13/2015   Depression 02/12/2015   Vaginal atrophy 11/14/2014   Hearing loss 11/15/2013   Knee pain 09/05/2013   Lung nodule 08/14/2013   Nodule of tendon sheath 08/14/2013   Hemorrhoids, internal, with bleeding 04/05/2013   Hyperlipidemia 61/60/7371   Nonalcoholic steatohepatitis ([facility]) 02/28/2013   Essential hypertension, benign 02/07/2013   Class 1 obesity due to excess calories with serious comorbidity and body mass index (BMI) of 33.0 to 33.9 in adult 02/07/2013   GERD (gastroesophageal reflux disease) 02/07/2013   Rectal bleeding 11/02/2012  CKD (chronic kidney disease), stage II 09/21/2012   Asthma 08/25/2012   Incontinence overflow, stress female 08/25/2012   Anxiety 08/25/2012   Chronic insomnia 08/25/2012   Irritable bowel syndrome 12/10/2010    PCP: Lindell Spar, MD  REFERRING PROVIDER: Sharyn Creamer, MD  REFERRING DIAG:  R19.7 (ICD-10-CM) - Diarrhea, unspecified type  R15.9 (ICD-10-CM) - Incontinence of feces, unspecified fecal incontinence type  K76.0 (ICD-10-CM) - Hepatic steatosis  R14.0 (ICD-10-CM) - Bloating  R13.10 (ICD-10-CM) - Dysphagia, unspecified type  Z86.19 (ICD-10-CM) - History of Clostridioides difficile infection  Z90.49 (ICD-10-CM) - History of cholecystectomy  R11.2 (ICD-10-CM) - Nausea and vomiting, unspecified vomiting type    THERAPY DIAG:  Muscle weakness (generalized)  Unspecified lack of coordination  Abnormal posture  ONSET DATE: 2015  SUBJECTIVE:                                                                                                                                                                                           SUBJECTIVE STATEMENT: Pt reports she is only have fecal leakage if she has liquid stools based on her diet. Pt also  reports she has urinary leakage only with standing too quickly after voiding at toilet but if she relaxes and does a little mobility at toilet this does not occur any more. Pt also continues to have great improvement with mobility and pain levels overall since shot in June. No longer having any pelvic pain now, bowel movements are much more regular now once per day with no leakage unless she has a liquid stool based on diet.  Fluid intake: Yes: 6-7 20oz water bottles    Patient confirms identification and approves PT to assess pelvic floor and treatment Yes   PAIN:  Are you having pain? Yes NPRS scale: 2/10 Pain location:  low back after driving over to clinic    PRECAUTIONS: Other: allergic to tape and latex, low vision  WEIGHT BEARING RESTRICTIONS No  FALLS:  Has patient fallen in last 6 months? No  LIVING ENVIRONMENT: Lives with: lives with their family and lives alone Lives in: House/apartment   OCCUPATION: not working  PLOF: Independent  PATIENT GOALS to have less pain and deceased leakage  PERTINENT HISTORY:  colon polyps, asthma, PE, hypothyroidism, IBS, hemorrhoids s/p hemorrhoidectomy in 2017, prior anal fissure s/p sphincterotomy in 07/2021 presents with diarrhea and fecal incontinence. Sexual abuse: No  BOWEL MOVEMENT Pain with bowel movement: Yes Type of bowel movement:Type (B[place] Stool Scale) 4-6, Frequency varies 4-8 times per day, and Strain Yes Fully empty rectum: No Leakage: Yes: based on diet per pt, can't stop when she feels  something and seems like she is pushing out Pads: Yes: sometimes Fiber supplement: Yes: Probiotic which helps, without this stool is type 7  URINATION Pain with urination: Yes after finished urinating, pt feels like her urethra is being stretched Fully empty bladder: No sometimes feels like she can but sometimes has strong urge and not a lot empties Stream: Strong Urgency: Yes:   Frequency: can be 3 hours but sometimes every  45 mins Leakage:  after urination needs to wait to make sure nothing else empties  and feels like she needs to push to be done.  Pads: No  INTERCOURSE Pain with intercourse:  not active   PREGNANCY Vaginal deliveries 1 Tearing Yes: episiotomy  C-section deliveries  0 Currently pregnant No  PROLAPSE None    OBJECTIVE:   DIAGNOSTIC FINDINGS:    COGNITION:  Overall cognitive status: Within functional limits for tasks assessed     SENSATION:  Light touch: Appears intact  Proprioception: Appears intact  MUSCLE LENGTH: Bil hamstrings and adductors limited by 50%  GAIT: Distance walked: 300' Assistive device utilized: None Level of assistance: Modified independence Comments: decreased stride length, cadence, decreased step height bil  POSTURE:  Rounded shoulders and posterior pelvic tilt  LUMBARAROM/PROM  Limited in all directions by 25%  LE ROM:  WFL  LE MMT:  Bil hips grossly 3/5  PELVIC MMT:   MMT  02/11/22  Vaginal 4/5; 10s; 6 reps  Internal Anal Sphincter   External Anal Sphincter   Puborectalis   Diastasis Recti   (Blank rows = not tested)        PALPATION:   General  TTP throughout abdomen with fascial restrictions in all quadrants                External Perineal Exam TTP at Rt anterior pelvis, over pubic symphysis, Rt proximal adductor and medial glute and bil bulbocavernosus.                              Internal Pelvic Floor TTP throughout but worse at Rt side  TONE: Increased   PROLAPSE: Not seen in hooklying   TODAY'S TREATMENT   04/29/2022: Bridges x10  Opp hand/knee press x10 each [id number] with small yoga ball x10 each 2x10 Sit to stand from mat table Standing 5# at waist height x15 lateral step outs each Standing marching 5# kettle bell hold 2x10 each leg Seated pelvic tilts x10 ant/post and rt/lt Farmers carry 3# and 5# 500' minimal cues for midline      PATIENT EDUCATION:  Education details: [id number] Person  educated: Patient Education method: Explanation, Demonstration, Tactile cues, Verbal cues, and Handouts Education comprehension: verbalized understanding and returned demonstration   HOME EXERCISE PROGRAM: [email or web address]  ASSESSMENT:  CLINICAL IMPRESSION:  patient reports she is feeling 90% better since starting PT, she is no longer bothered by any leakage instances and knows how to improve these if needed at home, no longer having any pelvic pain and feels more confident about mobility levels at home. Pt is limited in mobility due to low vision but comfortable at her home or if with someone in community. Pt session focused on education on ways to continue HEP and mobility at home safely pt denied additional questions with this. Session focused on core and hip strengthening in various positions for improved pelvic stability and decreased strain at pelvic floor. Pt tolerated well no pain. Pt has met all goals and agreeable to  DC today.    OBJECTIVE IMPAIRMENTS decreased coordination, decreased endurance, decreased mobility, decreased strength, increased fascial restrictions, increased muscle spasms, impaired flexibility, impaired vision/preception, improper body mechanics, postural dysfunction, and pain.   ACTIVITY LIMITATIONS cleaning, community activity, and yard work.   PERSONAL FACTORS Past/current experiences, Time since onset of injury/illness/exacerbation, and 3+ comorbidities: complex medical history  are also affecting patient's functional outcome.    REHAB POTENTIAL: Good  CLINICAL DECISION MAKING: Evolving/moderate complexity  EVALUATION COMPLEXITY: Moderate   GOALS: Goals reviewed with patient? Yes  SHORT TERM GOALS: Target date: 03/11/2022  (Remove Blue Hyperlink)  Pt to be I with HEP. Baseline: Goal status: MET  2.  Pt to demonstrate improved coordination with pelvic floor with contract/relax/bulge for improved mechanics and decreased tension at pelvic floor for less pain.   Baseline:  Goal status: MET  3.  Pt to be I with recall of voiding and breathing mechanics to improve bowel and urination habits.  Baseline:  Goal status: MET    LONG TERM GOALS: Target date: 05/14/2022  (Remove Blue Hyperlink)  Pt to be I with advanced HEP.  Baseline:  Goal status: MET  2.  Pt will report her BMs are complete due to improved bowel habits and evacuation techniques.  Baseline:  Goal status: MET  3.  Pt will be able to functional actions such as walking 15 mins without leakage  Baseline:  Goal status: MET  4.  Pt will report more regular bowel movements to no more than 3 per day due to improved muscle tone and coordination with BMs.  Baseline:  Goal status:Met (one per day now)  5.  Pt to demonstrate at least 5/5 pelvic floor strength for improved pelvic stability and decreased strain at pelvic floor/ decrease leakage and ability to consistently relax fully without compensatory strategies between contractions.  Baseline:  Goal status: pt deferred as she is no longer having symptoms.   6.  Pt to demonstrate at least 4+/5 bil hip strength for improved pelvic stability and functional squats without leakage and improved coordination of for pressure management.  Baseline:  Goal status: MET  PLAN: PT FREQUENCY:  every other week   PT DURATION:  6 sessions  PLANNED INTERVENTIONS: Therapeutic exercises, Therapeutic activity, Neuromuscular re-education, Patient/Family education, Joint mobilization, Aquatic Therapy, Dry Needling, Spinal mobilization, Cryotherapy, Moist heat, Manual lymph drainage, scar mobilization, Biofeedback, and Manual therapy  PLAN FOR NEXT SESSION:   PHYSICAL THERAPY DISCHARGE SUMMARY  Visits from Start of Care: 3  Current functional level related to goals / functional outcomes: All goals met (except pt deferred internal reassessment of strength due to no longer having symptoms)   Remaining deficits: None per pt   Education /  Equipment: HEP   Patient agrees to discharge. Patient goals were met. Patient is being discharged due to meeting the stated rehab goals.   Stacy Gardner, PT, DPT 04/30/2311:32 PM

## 2022-05-04 ENCOUNTER — Ambulatory Visit (INDEPENDENT_AMBULATORY_CARE_PROVIDER_SITE_OTHER): Payer: Medicare Other | Admitting: "Endocrinology

## 2022-05-04 ENCOUNTER — Encounter: Payer: Self-pay | Admitting: "Endocrinology

## 2022-05-04 VITALS — BP 114/70 | HR 80 | Ht 67.0 in | Wt 214.2 lb

## 2022-05-04 DIAGNOSIS — E039 Hypothyroidism, unspecified: Secondary | ICD-10-CM

## 2022-05-04 DIAGNOSIS — E782 Mixed hyperlipidemia: Secondary | ICD-10-CM

## 2022-05-04 IMAGING — MR MR HEAD W/O CM
13 of 14 series · 37 of 48 positions shown · non-contrast
Comparison: Head CT September 19, 2018. MRI of the brain October 15, 2011.

CLINICAL DATA: Chronic migraine without [place] without status
migrainosus, not intractable MS1.TV5 (VLF-RU-CM). Headache, new or
worsening.

EXAM:
MRI HEAD WITHOUT CONTRAST
TECHNIQUE: Multiplanar, multiecho pulse sequences of the brain and surrounding
structures were obtained without intravenous contrast.

[Series 5: DWI · axial · 3.0mm · 0.77mm/px · z∈[-73,+71]mm · 3 of 49 slices shown (1 of 6)]
[im 1/49]
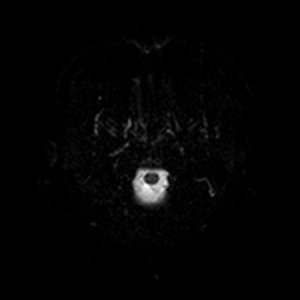
[im 25/49]
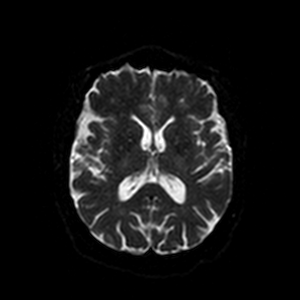
[im 49/49]
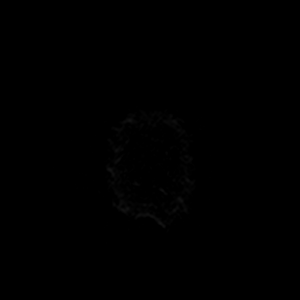

[Series 5: DWI · axial · 3.0mm · 0.77mm/px · z∈[-73,+71]mm · 3 of 49 slices shown (2 of 6)]
[im 1/49]
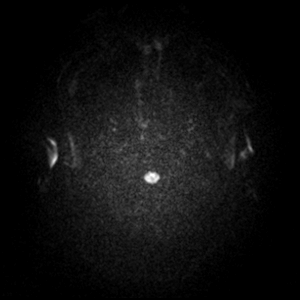
[im 25/49]
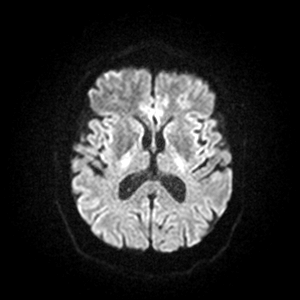
[im 49/49]
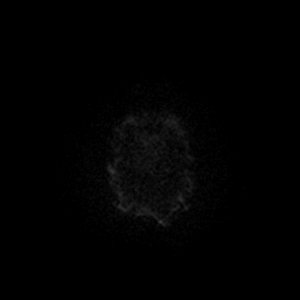

[Series 6: DWI · axial · 3.0mm · 0.77mm/px · z∈[-73,+71]mm · 3 of 49 slices shown (3 of 6)]
[im 1/49]
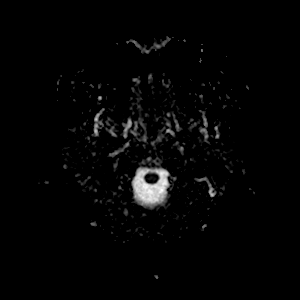
[im 25/49]
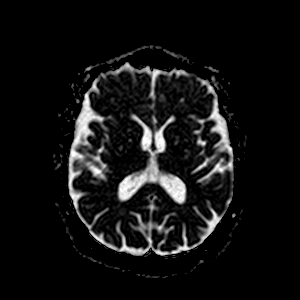
[im 49/49]
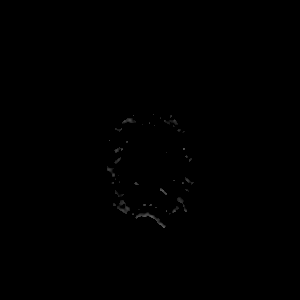

[Series 7: DWI · coronal · 5.0mm · 0.88mm/px · 2 of 28 slices shown (4 of 6)]
[im 1/28]
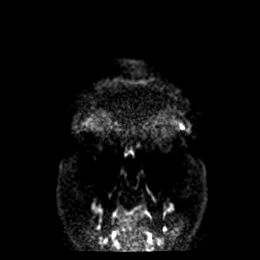
[im 28/28]
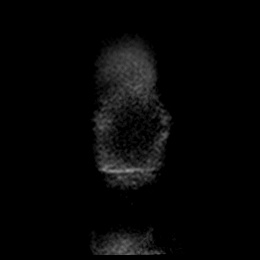

[Series 7: DWI · coronal · 5.0mm · 0.88mm/px · 2 of 28 slices shown (5 of 6)]
[im 1/28]
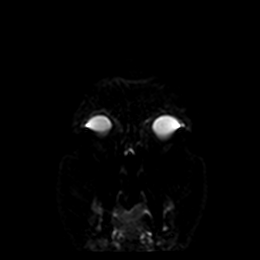
[im 28/28]
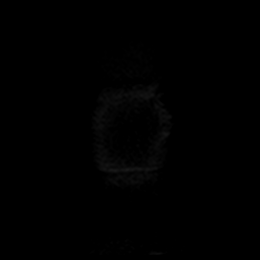

[Series 8: DWI · coronal · 5.0mm · 0.88mm/px · 2 of 28 slices shown (6 of 6)]
[im 1/28]
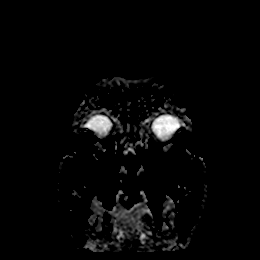
[im 28/28]
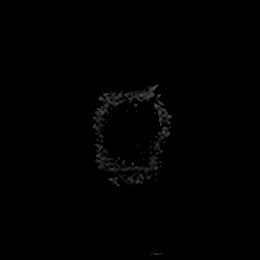

[Series 9: T1 · sagittal · 5.0mm · 0.75mm/px · 2 of 21 slices shown]
[im 1/21]
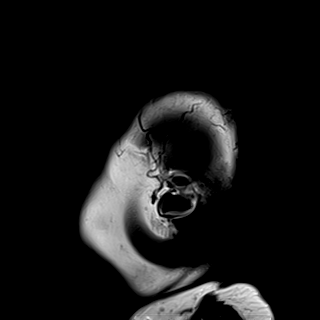
[im 21/21]
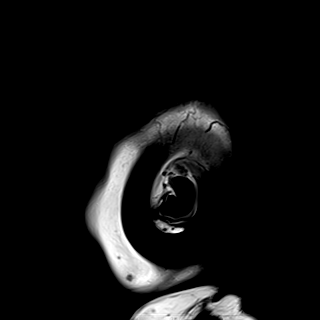

[Series 10: T2 · axial · 5.0mm · 0.72mm/px · z∈[-67,+66]mm · 2 of 20 slices shown (1 of 2)]
[im 1/20]
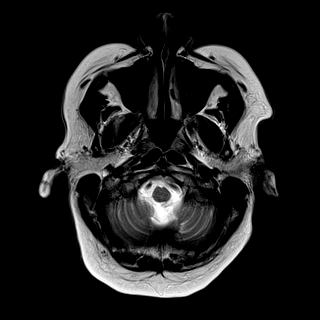
[im 20/20]
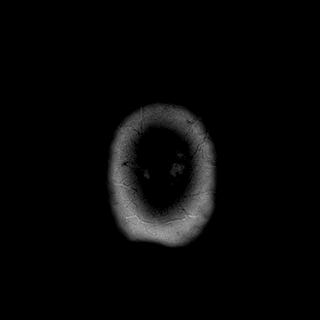

[Series 11: mag_images · axial · 3.0mm · 0.90mm/px · z∈[-70,+71]mm · 4 of 48 slices shown]
[im 1/48]
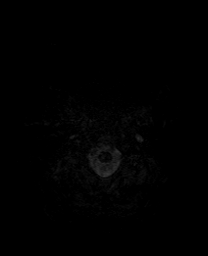
[im 16/48]
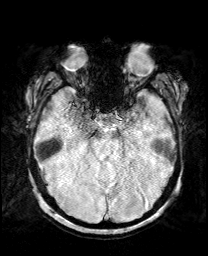
[im 32/48]
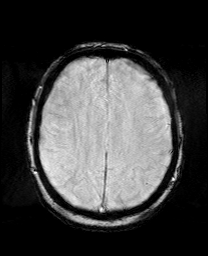
[im 48/48]
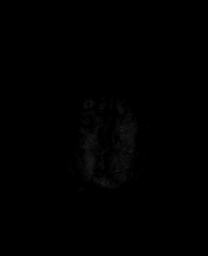

[Series 12: pha_images · axial · 3.0mm · 0.90mm/px · z∈[-70,+71]mm · 4 of 48 slices shown]
[im 1/48]
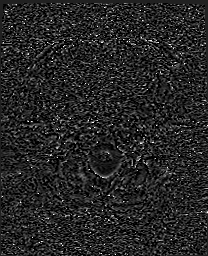
[im 16/48]
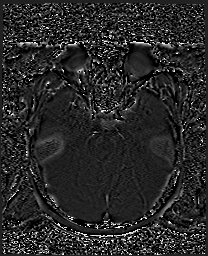
[im 32/48]
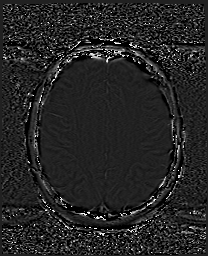
[im 48/48]
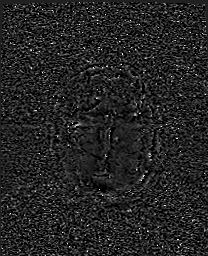

[Series 13: swi_images · axial · 3.0mm · 0.90mm/px · z∈[-70,+71]mm · 4 of 48 slices shown]
[im 1/48]
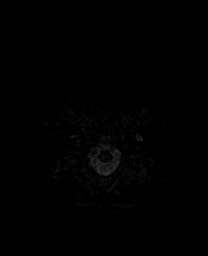
[im 16/48]
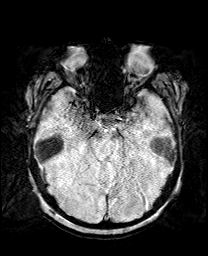
[im 32/48]
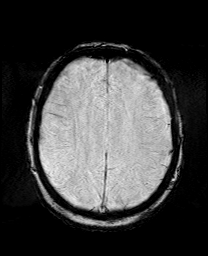
[im 48/48]
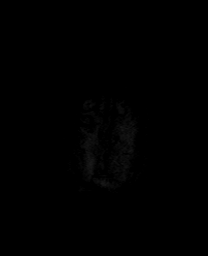

[Series 15: FLAIR · axial · 3.0mm · 0.45mm/px · z∈[-70,+71]mm · 4 of 48 slices shown]
[im 1/48]
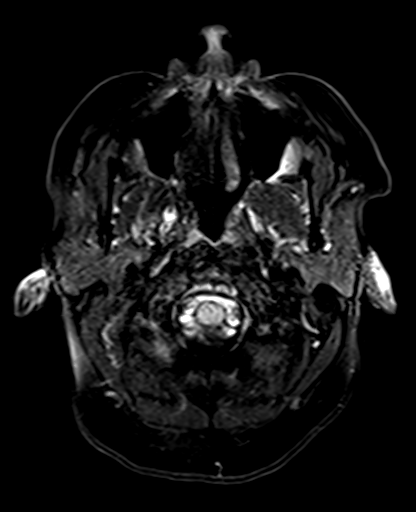
[im 16/48]
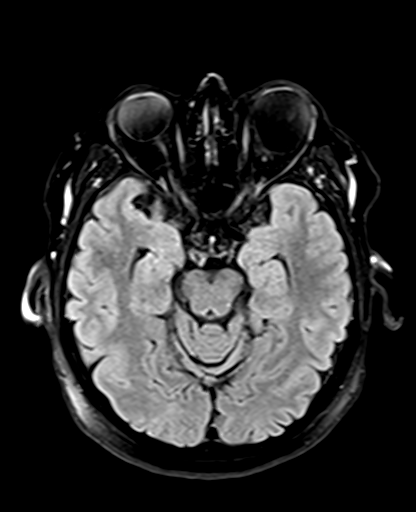
[im 32/48]
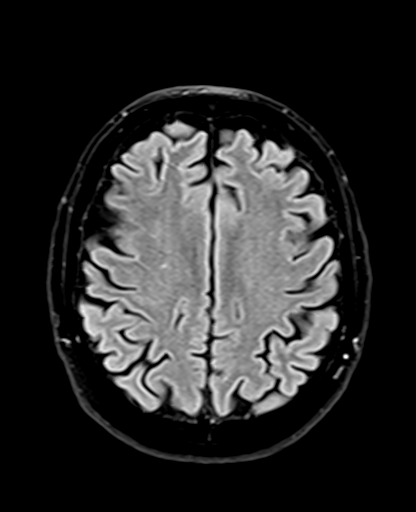
[im 48/48]
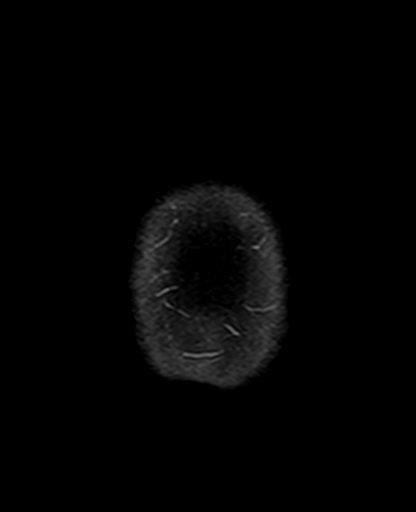

[Series 17: T2 · coronal · 5.0mm · 0.72mm/px · 2 of 28 slices shown (2 of 2)]
[im 1/28]
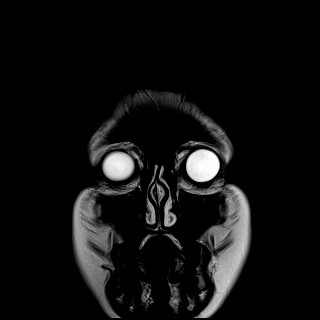
[im 28/28]
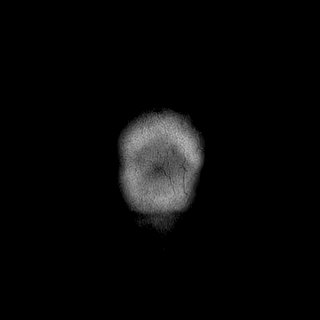

[37 of 48 positions shown; findings below may reference images not displayed]

FINDINGS: Brain: No acute infarction, hemorrhage, hydrocephalus, extra-axial
collection or mass lesion. Remote lacunar infarct in the right
cerebellar hemisphere. Few foci of T2 hyperintensity are seen within
the white matter of the cerebral hemispheres, nonspecific, most
likely related to early chronic microangiopathy. Mild parenchymal
volume loss.

Vascular: Normal flow voids.

Skull and upper cervical spine: Normal marrow signal.

Sinuses/Orbits: Paranasal sinuses are clear. Bilateral lens surgery.
Left-sided staphyloma.

Other: None.
IMPRESSION: 1. Few nonspecific T2 hyperintense lesions of the white matter, may
represent early chronic microangiopathy.
2. Tiny remote infarct in the right cerebellar hemisphere.

## 2022-05-04 MED ORDER — LEVOTHYROXINE SODIUM 25 MCG PO TABS: 25.0000 ug | ORAL_TABLET | Freq: Every day | ORAL | 1 refills | Status: DC

## 2022-05-04 NOTE — Patient Instructions (Signed)

## 2022-05-04 NOTE — Progress Notes (Signed)
05/04/2022, 3:22 PM  Endocrinology follow-up note   Subjective:    Patient ID: [name], female    DOB: [birth year], PCP Lindell Spar, MD   Past Medical History:  Diagnosis Date   Anal fissure    Anxiety    Arthritis    Asthma    C. difficile diarrhea 09/17/2021   Cataract    Phreesia 01/05/2021   Chest pain    a. 02/2000 Cath: nl cors, EF 60%;  b. 10/2010 MV: nl LV, no ischemia/infarct;  c. 03/2014 Admit c/p, r/o->grief from husbands death.   Chronic RUQ pain 26-Dec-2007   EUS slightly dilated CBD (7.78m), otherwise nl   Depression    Diverticulosis    Eczema    Exposure to chemical inhalation mid 1970s   Fatty liver    G6PD deficiency    Gallstones    GERD (gastroesophageal reflux disease)    Glaucoma    History of pulmonary embolus (PE)    Treated with Eliquis 203-22-16  HTN (hypertension)    Hypercholesteremia    a. intolerant to statins.   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 She has limited vision in the right eye.   Migraines    inner ocular   Ocular migraine    Palpitations    Pleurisy    Pneumonia 07/16/2021   Patient was exposed to a harsh chemical in the 1970's that created scar tissue in her lungs. She has had pneumonia several times in the past.   PONV (postoperative nausea and vomiting)    Pulmonary emboli (HCC)    Recurrent upper respiratory infection (URI)    Renal cyst    Retinal cyst    Sickle cell trait (HCC)    Tubular adenoma of colon 09/17/2011   Urticaria    Past Surgical History:  Procedure Laterality Date   ABDOMINAL HYSTERECTOMY     ADENOIDECTOMY     ANGIOPLASTY     APPENDECTOMY     BIOPSY N/A 03/14/2013   Procedure: SMALL BOWEL AND GASTRIC BIOPSIES (Procedure #1);  Surgeon: SDanie Binder MD;  Location: [facility];  Service: Endoscopy;  Laterality: N/A;   CARDIAC CATHETERIZATION Left  02/11/2000   CATARACT EXTRACTION Left    CATARACT EXTRACTION W/PHACO Right 03/19/2021   Procedure: CATARACT EXTRACTION PHACO AND INTRAOCULAR LENS PLACEMENT ([place] RIGHT 6.75 00:50.4;  Surgeon: BLeandrew Koyanagi MD;  Location: [facility]  Service: Ophthalmology;  Laterality: Right;   CHOLECYSTECTOMY  12/2007   COLONOSCOPY  12/22/2010   ROYD:XAJOIN cecal adenomatous polyp   COLONOSCOPY WITH PROPOFOL N/A 03/31/2016   Dr. FOneida Alar four sessile polyps rectum/sigmoid colon, diverticulosi, ext/int hemorrhoids. hyperplastic polyps, next tcs 5 years.   COLONOSCOPY WITH PROPOFOL N/A 04/21/2021   anal fissure, non-bleeding internal hemorrhoids, right colon diverticulosis, one 2 mm polyp in ascending, three 4-6 mm polyps in descending colon. Tubular adenomas. 5 year surveillance.   complete hysterectomy     ENTEROSCOPY N/A 03/14/2013   SOMV:EHMCgastritis/ulcers has healed   ESOPHAGOGASTRODUODENOSCOPY  09/22/2011   SNOB:SJGGgastritis/Duodenitis   EXCISIONAL HEMORRHOIDECTOMY     FLEXIBLE  SIGMOIDOSCOPY N/A 03/14/2013   SLF:3 colon polyp removed/moderate sized internal hemorrhoids   FLEXIBLE SIGMOIDOSCOPY N/A 06/09/2016   Procedure: FLEXIBLE SIGMOIDOSCOPY;  Surgeon: Danie Binder, MD;  [facility];  Service: Endoscopy;  Laterality: N/A;  rectal polyps times 2   FLEXIBLE SIGMOIDOSCOPY N/A 03/18/2018   Procedure: FLEXIBLE SIGMOIDOSCOPY;  Surgeon: Danie Binder, MD;  [facility];  Service: Endoscopy;  Laterality: N/A;  9:15am   FOOT SURGERY     bunion removal left   GIVENS CAPSULE STUDY N/A 02/10/2013   Procedure: GIVENS CAPSULE STUDY;  Surgeon: Danie Binder, MD;  [facility];  Service: Endoscopy;  Laterality: N/A;  [place]     right    HEMORRHOID BANDING N/A 03/14/2013   Procedure: HEMORRHOID BANDING (Procedure #3)  3 bands applied [id number] Exp 02/02/2014 ;  Surgeon: Danie Binder, MD;  Location: [facility];  Service: Endoscopy;   Laterality: N/A;   HEMORRHOID SURGERY N/A 04/24/2016   Procedure: EXTENSIVE HEMORRHOIDECTOMY;  Surgeon: Vickie Epley, MD;  Location: [facility];  Service: General;  Laterality: N/A;   LAPAROSCOPY     adhesions   POLYPECTOMY N/A 03/14/2013   TMH:DQQI Gastritis . ULCERS SEEN ON MAY 6 HAVE HEALED   POLYPECTOMY  03/31/2016   Procedure: POLYPECTOMY;  Surgeon: Danie Binder, MD;  [facility];  Service: Endoscopy;;  sigmoid colon polyps x2, rectal polyps x2   POLYPECTOMY  04/21/2021   Procedure: POLYPECTOMY;  Surgeon: Eloise Harman, DO;  [facility];  Service: Endoscopy;;   RECTAL EXAM UNDER ANESTHESIA N/A 07/23/2021   Procedure: RECTAL EXAM UNDER ANESTHESIA;  Surgeon: Leighton Ruff, MD;  Location: [facility];  Service: General;  Laterality: N/A;   SPHINCTEROTOMY N/A 07/23/2021   Procedure: CHEMICAL SPHINCTEROTOMY;  Surgeon: Leighton Ruff, MD;  Location: [place];  Service: General;  Laterality: N/A;   TONSILLECTOMY     TUBAL LIGATION     Social History   Socioeconomic History   Marital status: Widowed    Spouse name: Not on file   Number of children: 1   Years of education: Not on file   Highest education level: Not on file  Occupational History   Occupation: homemaker    Employer: UNEMPLOYED  Tobacco Use   Smoking status: Former    Packs/day: 0.50    Years: 30.00    Total pack years: 15.00    Types: Cigarettes    Quit date: 10/15/1996    Years since quitting: 25.5   Smokeless tobacco: Never   Tobacco comments:    Stopped smoking ~ 2000  Vaping Use   Vaping Use: Never used  Substance and Sexual Activity   Alcohol use: Yes    Alcohol/week: 4.0 standard drinks of alcohol    Types: 4 Glasses of wine per week    Comment: 1 drinks every couple of weeks   Drug use: No   Sexual activity: Yes    Birth control/protection: Surgical  Other Topics Concern   Not on file  Social History Narrative   Does not routinely  exercise. Husband passed in JUN 2015 due to prostate ca.   Social Determinants of Health   Financial Resource Strain: Low Risk  (04/02/2021)   Overall Financial Resource Strain (CARDIA)    Difficulty of Paying Living Expenses: Not hard at all  Food Insecurity: No Food Insecurity (05/22/2021)   Hunger Vital Sign    Worried  About Running Out of Food in the Last Year: Never true    Ran Out of Food in the Last Year: Never true  Transportation Needs: Unmet Transportation Needs (05/01/2022)   PRAPARE - Transportation    Lack of Transportation (Medical): Yes    Lack of Transportation (Non-Medical): Yes  Physical Activity: Sufficiently Active (04/02/2021)   Exercise Vital Sign    Days of Exercise per Week: 7 days    Minutes of Exercise per Session: 60 min  Stress: No Stress Concern Present (04/02/2021)   [place]    Feeling of Stress : Not at all  Social Connections: Socially Isolated (04/02/2021)   Social Connection and Isolation Panel [NHANES]    Frequency of Communication with Friends and Family: More than three times a week    Frequency of Social Gatherings with Friends and Family: Never    Attends Religious Services: Never    Marine scientist or Organizations: No    Attends Archivist Meetings: Never    Marital Status: Widowed   Family History  Problem Relation Age of Onset   Colon cancer Mother        71s   Urticaria Mother    Heart disease Mother    Colon polyps Mother    Cancer Father        oral   Crohn's disease Sister    Cancer Sister        around heart   Colon polyps Sister    Diabetes Brother    Other Brother        intestinal sugery   Colon polyps Brother    Colon cancer Maternal Grandmother    Colon cancer Maternal Grandfather    Colon cancer Paternal Grandfather    Anesthesia problems Neg Hx    Outpatient Encounter Medications as of 05/04/2022  Medication Sig   albuterol  (PROVENTIL) (2.5 MG/3ML) 0.083% nebulizer solution Take 3 mLs (2.5 mg total) by nebulization every 4 (four) hours as needed for wheezing or shortness of breath.   albuterol (VENTOLIN HFA) 108 (90 Base) MCG/ACT inhaler Inhale 2 puffs into the lungs every 4 (four) hours as needed for wheezing.   ALPRAZolam (XANAX) 1 MG tablet Take 1 tablet (1 mg total) by mouth 2 (two) times daily as needed for anxiety. (Patient taking differently: Take 0.5-1 mg by mouth at bedtime.)   brimonidine (ALPHAGAN) 0.2 % ophthalmic solution Place 1 drop into both eyes 2 (two) times daily.   budesonide-formoterol (SYMBICORT) 160-4.5 MCG/ACT inhaler Inhale 2 puffs into the lungs as needed.   chlorpheniramine-HYDROcodone (TUSSIONEX PENNKINETIC ER) 10-8 MG/5ML SUER Take 5 mLs by mouth every 12 (twelve) hours as needed. (Patient taking differently: Take 5 mLs by mouth every 12 (twelve) hours as needed for cough.)   Cholecalciferol (VITAMIN D3) 50 MCG (2000 UT) TABS Take 2,000 Units by mouth daily.   cyclobenzaprine (FLEXERIL) 10 MG tablet TAKE ONE TABLET BY MOUTH DAILY  as needed muscle spasms (Patient taking differently: Take 10 mg by mouth daily as needed (Arthritis and torn rotator cuff left).)   dexlansoprazole (DEXILANT) 60 MG capsule Take 1 capsule (60 mg total) by mouth daily.   diazepam (VALIUM) 5 MG tablet Take one tablet by mouth with food one hour prior to procedure. May repeat 30 minutes prior if needed.   dicyclomine (BENTYL) 10 MG capsule Take one capsule before meals and at bedtime AS NEEDED for abdominal pain and frequent stool. HOLD for constipation (  Patient taking differently: Take 10 mg by mouth daily as needed (IBS).)   DUREZOL 0.05 % EMUL Place 1 drop into the right eye daily.   estradiol (ESTRACE VAGINAL) 0.1 MG/GM vaginal cream Place 1 Applicatorful vaginally daily as needed.   fenofibrate (TRICOR) 145 MG tablet Take 1 tablet (145 mg total) by mouth daily.   HYDROcodone-acetaminophen (NORCO/VICODIN) 5-325 MG  tablet Take 1 tablet by mouth every 6 (six) hours as needed for moderate pain.   hyoscyamine (ANASPAZ) 0.125 MG TBDP disintergrating tablet Place 1 tablet (0.125 mg total) under the tongue daily as needed (IBS). Place 0.125 mg under the tongue daily as needed (IBS).   irbesartan (AVAPRO) 150 MG tablet Take 0.5 tablets (75 mg total) by mouth daily.   latanoprost (XALATAN) 0.005 % ophthalmic solution Place 1 drop into both eyes at bedtime.   levothyroxine (SYNTHROID) 25 MCG tablet Take 1 tablet (25 mcg total) by mouth daily before breakfast.   Lidocaine-Hydrocortisone Ace 3-2.5 % KIT APPLY TO RECTUM QID for 2 weeks then AS NEEDED FOR RECTAL PAIN OR BLEEDING   Lifitegrast (XIIDRA) 5 % SOLN INSTILL 1 DROP IN BOTH EYES EVERY DAY AS NEEDED (EACH CONTAINER SHOULD YIELD 2 DROPS) STORE IN ORIGINAL FOIL POUCH (Patient taking differently: Place 1 drop into both eyes daily. (EACH CONTAINER SHOULD YIELD 2 DROPS) STORE IN ORIGINAL FOIL POUCH)   Magnesium 500 MG TABS Take 1 tablet by mouth daily.   meclizine (ANTIVERT) 12.5 MG tablet Take 1 tablet (12.5 mg total) by mouth 3 (three) times daily as needed for dizziness. (Patient taking differently: Take 12.5 mg by mouth 3 (three) times daily as needed (vertigo).)   Menthol, Topical Analgesic, (BIOFREEZE EX) Apply 1 application topically daily as needed (pain).   metroNIDAZOLE (METROCREAM) 0.75 % cream Apply 1 application. topically as needed.   naproxen (NAPROSYN) 500 MG tablet Take 1 tablet (500 mg total) by mouth daily as needed for moderate pain or mild pain.   neomycin-polymyxin b-dexamethasone (MAXITROL) 3.5-10000-0.1 OINT Place 1 application into both eyes at bedtime as needed. (Patient taking differently: Place 1 application  into both eyes at bedtime as needed (Dry eyes).)   nitroGLYCERIN (NITROLINGUAL) 0.4 MG/SPRAY spray Place 1 spray under the tongue every 5 (five) minutes as needed for chest pain.   NON FORMULARY Take 1 tablet by mouth daily as needed  (Stomach Bloat). FD GUARD   ondansetron (ZOFRAN) 4 MG tablet Take 1 tablet (4 mg total) by mouth every 8 (eight) hours as needed for nausea or vomiting.   prednisoLONE acetate (PRED FORTE) 1 % ophthalmic suspension Place 1 drop into the right eye 3 (three) times daily.   Probiotic Product (PROBIOTIC DAILY PO) Take 1 capsule by mouth daily. VSL #3   UNABLE TO FIND Place 1 application rectally daily as needed. [employer] Name: lidocaine 5% pramoxine 1% ointment per rectum as needed.   zolpidem (AMBIEN) 10 MG tablet Take 1 tablet (10 mg total) by mouth at bedtime as needed for sleep.   [DISCONTINUED] levothyroxine (SYNTHROID) 25 MCG tablet Take 1 tablet (25 mcg total) by mouth daily before breakfast.   No facility-administered encounter medications on file as of 05/04/2022.   ALLERGIES: Allergies  Allergen Reactions   Nurtec [Rimegepant Sulfate] Anaphylaxis   Nutmeg Oil (Myristica Oil) Anaphylaxis    Nut oil- skin irritation. Nut oil- skin irritation.   Adhesive [Tape] Other (See Comments)    Blisters skin   Aspirin Other (See Comments)    sickle cell trait--  Not recommended unless emergency   Imitrex [Sumatriptan] Hives   Keflex [Cephalexin] Other (See Comments)    G6PD   Levaquin [Levofloxacin In D5w]     Altered mental status Affects G6PD   Other     Nut Oil- skin irritation    Statins Other (See Comments)    Myopathy - elevated CK Myopathy - elevated CK Myopathy-elevated CK   Sulfa Antibiotics Other (See Comments)    Patient has sickle cell trait Sickle cell trait.   Sulfonamide Derivatives Other (See Comments)    Patient has sickle cell trait   Latex Rash    VACCINATION STATUS: Immunization History  Administered Date(s) Administered   Fluad Quad(high Dose 65+) 06/05/2019, 07/22/2020, 06/16/2021   Influenza Split 07/12/2012   Influenza,inj,Quad PF,6+ Mos 08/03/2014, 07/19/2015, 08/11/2016, 06/24/2017, 07/04/2018   Moderna Covid-19 Vaccine Bivalent  Booster 78yr & up 01/31/2021, 06/30/2021, 01/30/2022   Moderna Sars-Covid-2 Vaccination 11/16/2019, 12/15/2019, 08/02/2020   Pneumococcal Conjugate-13 02/15/2018   Pneumococcal Polysaccharide-23 08/03/2014, 01/12/2020   Tdap 08/25/2012, 08/03/2014   Zoster Recombinat (Shingrix) 01/16/2020, 03/21/2020   Zoster, Live 08/25/2012    HPI Joanne SCHRAEDERis 71y.o. female who presents today with a medical history as above. she is being seen in follow-up after she was seen in consultation for hypercalcemia which has since resolved, hypothyroidism on low-dose levothyroxine 25 mcg p.o. daily before breakfast.  She is also on vitamin D deficiency.  She was supposed to be on regular supplement of magnesium, however admittedly she has interrupted her magnesium for several weeks before her lab work still showing hypomagnesemia.    She was seen with mild hypercalcemia, likely associated with hydrochlorothiazide.  She continues to have stable calcium level after she was advised to discontinue hydrochlorothiazide.   She was also found to have mild hypothyroidism for which she was initiated on low-dose levothyroxine 25 mcg p.o. daily before breakfast.  She benefited from this hormone was stabilization of her thyroid function tests towards target.  She has no new complaints today.  She continues to feel better.  She denies any prior history of parathyroid/pituitary/adrenal dysfunction. -She does have a family history of some thyroid dysfunction in her cousins.    She made some changes in her lifestyle including avoiding some processed carbs. Denies history of diabetes/prediabetes.  She is on multiple vitamin supplements/medications including inhalers for asthma.  Review of Systems Limited as above.  Objective:       05/04/2022    1:46 PM 03/12/2022   10:12 AM 03/03/2022    3:21 PM  Vitals with BMI  Height 5' 7"   5' 7"   Weight 214 lbs 3 oz  221 lbs  BMI 338.75 364.33 Systolic 129597 1188 Diastolic  70 58 90  Pulse 80 79 85    BP 114/70   Pulse 80   Ht 5' 7"  (1.702 m)   Wt 214 lb 3.2 oz (97.2 kg)   BMI 33.55 kg/m   Wt Readings from Last 3 Encounters:  05/04/22 214 lb 3.2 oz (97.2 kg)  03/03/22 221 lb (100.2 kg)  02/25/22 218 lb (98.9 kg)    Physical Exam    CMP ( most recent) CMP     Component Value Date/Time   NA 136 04/27/2022 1112   NA 138 01/20/2022 1433   K 3.6 04/27/2022 1112   K 4.3 07/12/2012 0755   CL 107 04/27/2022 1112   CL 100 07/12/2012 0755   CO2 22 04/27/2022 1112   GLUCOSE  130 (H) 04/27/2022 1112   BUN 14 04/27/2022 1112   BUN 13 01/20/2022 1433   CREATININE 1.10 (H) 04/27/2022 1112   CREATININE 1.23 (H) 12/11/2020 1023   CALCIUM 9.3 04/27/2022 1112   CALCIUM 11.0 07/12/2012 0755   PROT 7.3 04/27/2022 1112   PROT 6.8 09/08/2021 1514   PROT 8.1 07/12/2012 0755   ALBUMIN 4.1 04/27/2022 1112   ALBUMIN 4.5 09/08/2021 1514   AST 65 (H) 04/27/2022 1112   AST 42 07/12/2012 0755   ALT 36 04/27/2022 1112   ALKPHOS 33 (L) 04/27/2022 1112   ALKPHOS 32 07/12/2012 0755   BILITOT 1.4 (H) 04/27/2022 1112   BILITOT 0.7 09/08/2021 1514   BILITOT 1.0 07/12/2012 0755   GFRNONAA 54 (L) 04/27/2022 1112   GFRNONAA 36 (L) 07/22/2020 1441   GFRAA 41 (L) 07/22/2020 1441     Diabetic Labs (most recent): Lab Results  Component Value Date   HGBA1C 5.2 01/20/2022   HGBA1C 5.1 12/11/2020   HGBA1C 5.4 06/14/2019   MICROALBUR 16.6 01/04/2017     Lipid Panel ( most recent) Lipid Panel     Component Value Date/Time   CHOL 205 (H) 01/20/2022 1433   CHOL 254 07/12/2012 0755   TRIG 222 (H) 01/20/2022 1433   TRIG 325 07/12/2012 0755   HDL 26 (L) 01/20/2022 1433   CHOLHDL 7.9 (H) 01/20/2022 1433   CHOLHDL 6.9 (H) 12/11/2020 1023   VLDL 42 (H) 05/07/2017 0918   LDLCALC 139 (H) 01/20/2022 1433   LDLCALC 156 (H) 12/11/2020 1023   LABVLDL 40 01/20/2022 1433      Lab Results  Component Value Date   TSH 2.306 04/27/2022   TSH 2.200 09/08/2021   TSH 1.400  03/24/2021   TSH 1.770 09/23/2020   TSH 1.710 05/24/2020   TSH 5.12 (H) 01/12/2020   TSH 1.66 11/24/2016   TSH 3.900 07/29/2015   TSH 2.917 08/03/2014   TSH 6.160 (H) 03/31/2014   FREET4 1.04 04/27/2022   FREET4 1.22 09/08/2021   FREET4 1.36 03/24/2021   FREET4 1.46 09/23/2020   FREET4 1.31 05/24/2020   FREET4 1.3 01/12/2020   FREET4 1.19 08/03/2014      Assessment & Plan:   1. Hypercalcemia  2.  Hypomagnesemia -Her previsit labs continue to show normal calcium after her HCTZ was discontinued.    She will not need 24-hour urine calcium measurement for now.  Her current calcium level at 9.3 is consistent with normocalcemia.  She is advised to resume and continue magnesium supplement at 500 mg p.o. daily at breakfast.    3.  Hypothyroidism -Her previsit thyroid function tests are consistent with appropriate replacement.  She is advised to continue levothyroxine 25 mcg p.o. daily before breakfast.     - We discussed about the correct intake of her thyroid hormone, on empty stomach at fasting, with water, separated by at least 30 minutes from breakfast and other medications,  and separated by more than 4 hours from calcium, iron, multivitamins, acid reflux medications (PPIs). -Patient is made aware of the fact that thyroid hormone replacement is needed for life, dose to be adjusted by periodic monitoring of thyroid function tests.  -Her neck exam is negative for goiter, no need for thyroid imaging for now. Her supraclavicular fullness is due to increased intrathoracic pressure due to asthma/COPD.    - she acknowledges that there is a room for improvement in her food and drink choices. In light of her severe dyslipidemia and intolerance to statins, likely  MASLD, she is a good candidate for lifestyle medicine.  - she acknowledges that there is a room for improvement in her food and drink choices. - Suggestion is made for her to avoid simple carbohydrates  from her diet including Cakes,  Sweet Desserts, Ice Cream, Soda (diet and regular), Sweet Tea, Candies, Chips, Cookies, Store Bought Juices, Alcohol , Artificial Sweeteners,  Coffee Creamer, and "Sugar-free" Products, Lemonade. This will help patient to have more stable blood glucose profile and potentially avoid unintended weight gain.  The following Lifestyle Medicine recommendations according to [place]  [facility]) were discussed and and offered to patient and she  agrees to start the journey:  A. Whole Foods, Plant-Based Nutrition comprising of fruits and vegetables, plant-based proteins, whole-grain carbohydrates was discussed in detail with the patient.   A list for source of those nutrients were also provided to the patient.  Patient will use only water or unsweetened tea for hydration. B.  The need to stay away from risky substances including alcohol, smoking; obtaining 7 to 9 hours of restorative sleep, at least 150 minutes of moderate intensity exercise weekly, the importance of healthy social connections,  and stress management techniques were discussed. C.  A full color page of  Calorie density of various food groups per pound showing examples of each food groups was provided to the patient.   - she is advised to maintain close follow up with Lindell Spar, MD for primary care needs.    I spent 35 minutes in the care of the patient today including review of labs from Thyroid Function, CMP, and other relevant labs ; imaging/biopsy records (current and previous including abstractions from other facilities); face-to-face time discussing  her lab results and symptoms, medications doses, her options of short and long term treatment based on the latest standards of care / guidelines;   and documenting the encounter.  Nilam Quakenbush Levitt  participated in the discussions, expressed understanding, and voiced agreement with the above plans.  All questions were answered to her satisfaction. she is  encouraged to contact clinic should she have any questions or concerns prior to her return visit.     Follow up plan: Return in about 6 months (around 11/04/2022) for F/U with Pre-visit Labs, A1c -NV.   Glade Lloyd, MD [facility] [address] [place], [place] [address] Phone: [phone]  Fax: [phone]     05/04/2022, 3:22 PM  This note was partially dictated with voice recognition software. Similar sounding words can be transcribed inadequately or may not  be corrected upon review.

## 2022-05-05 ENCOUNTER — Encounter: Payer: Medicare Other | Admitting: Physical Therapy

## 2022-05-07 ENCOUNTER — Encounter: Payer: Self-pay | Admitting: Nurse Practitioner

## 2022-05-07 ENCOUNTER — Telehealth (INDEPENDENT_AMBULATORY_CARE_PROVIDER_SITE_OTHER): Payer: Medicare Other | Admitting: Nurse Practitioner

## 2022-05-07 DIAGNOSIS — J209 Acute bronchitis, unspecified: Secondary | ICD-10-CM | POA: Diagnosis not present

## 2022-05-07 MED ORDER — AZITHROMYCIN 250 MG PO TABS: ORAL_TABLET | ORAL | 0 refills | Status: AC

## 2022-05-07 MED ORDER — METHYLPREDNISOLONE 4 MG PO TBPK: ORAL_TABLET | ORAL | 0 refills | Status: DC

## 2022-05-07 NOTE — Assessment & Plan Note (Signed)
RX azithromax 287m. Take 2 tablets on day 1, then 1 tablet daily on days 2 through 5 Medrol dose pack 448mtablets.  Continue Tylenol as needed

## 2022-05-07 NOTE — Progress Notes (Signed)
Virtual Visit via Video Note  I connected with [name] @ on 05/07/22 at 1253by tele and verified that I am speaking with the correct person using two identifiers.  I spent 8 minutes on this telehealth encounter.   Location: Patient: home Provider: office    I discussed the limitations, risks, security and privacy concerns of performing an evaluation and management service by telephone and the availability of in person appointments. I also discussed with the patient that there may be a patient responsible charge related to this service. The patient expressed understanding and agreed to proceed.   History of Present Illness: [name] with past medical history of hypertension, GERD, diabetes, asthma hypothyroidism, osteoarthritis arthritis, insomnia presents for televisit for complaints of fever since July 20.  Fever started while she was in [place] she returned from [place] on July 27.  She has taken 10 COVID test all were negative.  Has  nonproductive cough, wheezing, body aches, lethargy, itchy eyes.  States that her fever has been between 99.5-100.5.  She has been taking Advil and Tylenol as needed.     Observations/Objective:   Assessment and Plan: Acute bronchitis RX azithromax 219m. Take 2 tablets on day 1, then 1 tablet daily on days 2 through 5 Medrol dose pack 448mtablets.  Continue Tylenol as needed   Follow Up Instructions:    I discussed the assessment and treatment plan with the patient. The patient was provided an opportunity to ask questions and all were answered. The patient agreed with the plan and demonstrated an understanding of the instructions.   The patient was advised to call back or seek an in-person evaluation if the symptoms worsen or if the condition fails to improve as anticipated.

## 2022-05-11 ENCOUNTER — Telehealth: Payer: Self-pay | Admitting: Internal Medicine

## 2022-05-11 NOTE — Telephone Encounter (Signed)
Can she get a refill on antibiotics?

## 2022-05-11 NOTE — Telephone Encounter (Signed)
Patient called in regard to last visit.   Patient is still having symptoms. Feels like cold is moving to chest and lungs.   Wants to see if can get more antibiotics. Would like a call back in regard.  Next appt 8/10

## 2022-05-12 ENCOUNTER — Ambulatory Visit (HOSPITAL_COMMUNITY)
Admission: RE | Admit: 2022-05-12 | Discharge: 2022-05-12 | Disposition: A | Discharge status: Home / Self Care | Payer: Medicare Other | Source: Ambulatory Visit | Attending: Nurse Practitioner | Admitting: Nurse Practitioner

## 2022-05-12 ENCOUNTER — Other Ambulatory Visit: Payer: Self-pay | Admitting: Nurse Practitioner

## 2022-05-12 ENCOUNTER — Telehealth: Payer: Self-pay

## 2022-05-12 DIAGNOSIS — J209 Acute bronchitis, unspecified: Secondary | ICD-10-CM

## 2022-05-12 DIAGNOSIS — R509 Fever, unspecified: Secondary | ICD-10-CM | POA: Diagnosis not present

## 2022-05-12 DIAGNOSIS — R059 Cough, unspecified: Secondary | ICD-10-CM | POA: Diagnosis not present

## 2022-05-12 NOTE — Progress Notes (Signed)
No active cardiopulmonary disease, patient should follow up with Dr Posey Pronto as planned

## 2022-05-12 NOTE — Telephone Encounter (Signed)
Already being handled

## 2022-05-12 NOTE — Telephone Encounter (Signed)
Needs [employer] [facility] for xray order to be done at [facility].  Does this need to be STAT order?  Please contact patient if STAT.

## 2022-05-12 NOTE — Telephone Encounter (Signed)
[employer] email sent.

## 2022-05-12 NOTE — Telephone Encounter (Signed)
Pt received call will be picked up today to have xray done.

## 2022-05-13 ENCOUNTER — Ambulatory Visit: Payer: Medicare Other | Admitting: Physical Therapy

## 2022-05-13 ENCOUNTER — Encounter: Payer: Medicare Other | Admitting: Physical Therapy

## 2022-05-14 ENCOUNTER — Encounter: Payer: Self-pay | Admitting: Internal Medicine

## 2022-05-14 ENCOUNTER — Ambulatory Visit (INDEPENDENT_AMBULATORY_CARE_PROVIDER_SITE_OTHER): Payer: Medicare Other | Admitting: Cardiology

## 2022-05-14 ENCOUNTER — Encounter: Payer: Self-pay | Admitting: Cardiology

## 2022-05-14 ENCOUNTER — Ambulatory Visit (INDEPENDENT_AMBULATORY_CARE_PROVIDER_SITE_OTHER): Payer: Medicare Other | Admitting: Internal Medicine

## 2022-05-14 VITALS — BP 128/84 | HR 88 | Resp 20 | Ht 67.0 in | Wt 209.6 lb

## 2022-05-14 VITALS — BP 120/68 | HR 90 | Ht 67.0 in | Wt 210.4 lb

## 2022-05-14 DIAGNOSIS — I10 Essential (primary) hypertension: Secondary | ICD-10-CM

## 2022-05-14 DIAGNOSIS — Z87898 Personal history of other specified conditions: Secondary | ICD-10-CM | POA: Diagnosis not present

## 2022-05-14 DIAGNOSIS — R0602 Shortness of breath: Secondary | ICD-10-CM

## 2022-05-14 DIAGNOSIS — J209 Acute bronchitis, unspecified: Secondary | ICD-10-CM

## 2022-05-14 DIAGNOSIS — F5104 Psychophysiologic insomnia: Secondary | ICD-10-CM

## 2022-05-14 DIAGNOSIS — F419 Anxiety disorder, unspecified: Secondary | ICD-10-CM

## 2022-05-14 DIAGNOSIS — E039 Hypothyroidism, unspecified: Secondary | ICD-10-CM | POA: Diagnosis not present

## 2022-05-14 DIAGNOSIS — E782 Mixed hyperlipidemia: Secondary | ICD-10-CM

## 2022-05-14 DIAGNOSIS — J4530 Mild persistent asthma, uncomplicated: Secondary | ICD-10-CM

## 2022-05-14 MED ORDER — HYDROCOD POLI-CHLORPHE POLI ER 10-8 MG/5ML PO SUER: 5.0000 mL | Freq: Two times a day (BID) | ORAL | 0 refills | Status: DC | PRN

## 2022-05-14 MED ORDER — ZOLPIDEM TARTRATE 10 MG PO TABS: 10.0000 mg | ORAL_TABLET | Freq: Every evening | ORAL | 1 refills | Status: DC | PRN

## 2022-05-14 NOTE — Progress Notes (Signed)
Established Patient Office Visit  Subjective:  Patient ID: [name], female    DOB: 1951-03-10  Age: 71 y.o. MRN: [id number]  CC:  Chief Complaint  Patient presents with   Cough    Patient has cough has had chest xray done the antibiotics and steroids and nebulizer helps but she is still coughing she went on a trip and came back in July 27 stayed in older houses while on vacation    HPI [name] is a 71 y.o. female with past medical history of HTN, GERD, IBS-C, NASH, anal fissure, hypothyroidism, OA, DDD of lumbar spine, anxiety/panic disorder, insomnia, left sided blind eye, G6PD mutation and sickle cell trait who presents for f/u of her chronic medical conditions.  HTN: BP is well-controlled. Takes medications regularly. Patient denies headache, dizziness, chest pain, dyspnea or palpitations.   Asthma: She has been having cough and recent worsening of dyspnea and wheezing. She has completed Azithromycin and is currently taking oral prednisone for acute bronchitis. Her CXR was negative for any acute infiltrates. She has been using Symbicort and PRN Albuterol neb and inhaler. She recently went to vacation to [place], and stayed in an old home, and was exposed to molds.  She has very limited vision in the right eye after cataract surgery.  She has been having difficulty ambulating even at home, but denies any recent fall.   She takes Ambien for insomnia.  She has Xanax for severe anxiety/panic episode, but does not require it frequently.  She denies anhedonia, SI or HI.   Past Medical History:  Diagnosis Date   Anal fissure    Anxiety    Arthritis    Asthma    C. difficile diarrhea 09/17/2021   Cataract    Phreesia 01/05/2021   Chest pain    a. 02/2000 Cath: nl cors, EF 60%;  b. 10/2010 MV: nl LV, no ischemia/infarct;  c. 03/2014 Admit c/p, r/o->grief from husbands death.   Chronic RUQ pain 2007-12-29   EUS slightly dilated CBD (7.18m), otherwise nl   Depression     Diverticulosis    Eczema    Exposure to chemical inhalation mid 1970s   Fatty liver    G6PD deficiency    Gallstones    GERD (gastroesophageal reflux disease)    Glaucoma    History of pulmonary embolus (PE)    Treated with Eliquis 2Mar 25, 2016  HTN (hypertension)    Hypercholesteremia    a. intolerant to statins.   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 She has limited vision in the right eye.   Migraines    inner ocular   Ocular migraine    Palpitations    Pleurisy    Pneumonia 07/16/2021   Patient was exposed to a harsh chemical in the 1970's that created scar tissue in her lungs. She has had pneumonia several times in the past.   PONV (postoperative nausea and vomiting)    Pulmonary emboli (HCC)    Recurrent upper respiratory infection (URI)    Renal cyst    Retinal cyst    Sickle cell trait (HCC)    Tubular adenoma of colon 09/17/2011   Urticaria     Past Surgical History:  Procedure Laterality Date   ABDOMINAL HYSTERECTOMY     ADENOIDECTOMY     ANGIOPLASTY     APPENDECTOMY  BIOPSY N/A 03/14/2013   Procedure: SMALL BOWEL AND GASTRIC BIOPSIES (Procedure #1);  Surgeon: Danie Binder, MD;  Location: [facility];  Service: Endoscopy;  Laterality: N/A;   CARDIAC CATHETERIZATION Left 02/11/2000   CATARACT EXTRACTION Left    CATARACT EXTRACTION W/PHACO Right 03/19/2021   Procedure: CATARACT EXTRACTION PHACO AND INTRAOCULAR LENS PLACEMENT ([place]) RIGHT 6.75 00:50.4;  Surgeon: Leandrew Koyanagi, MD;  Location: [place];  Service: Ophthalmology;  Laterality: Right;   CHOLECYSTECTOMY  12/2007   COLONOSCOPY  12/22/2010   FKC:LEXNTZ, cecal adenomatous polyp   COLONOSCOPY WITH PROPOFOL N/A 03/31/2016   Dr. Oneida Alar: four sessile polyps rectum/sigmoid colon, diverticulosi, ext/int hemorrhoids. hyperplastic polyps, next tcs 5 years.   COLONOSCOPY WITH PROPOFOL N/A 04/21/2021   anal  fissure, non-bleeding internal hemorrhoids, right colon diverticulosis, one 2 mm polyp in ascending, three 4-6 mm polyps in descending colon. Tubular adenomas. 5 year surveillance.   complete hysterectomy     ENTEROSCOPY N/A 03/14/2013   GYF:VCBS gastritis/ulcers has healed   ESOPHAGOGASTRODUODENOSCOPY  09/22/2011   WHQ:PRFF gastritis/Duodenitis   EXCISIONAL HEMORRHOIDECTOMY     FLEXIBLE SIGMOIDOSCOPY N/A 03/14/2013   SLF:3 colon polyp removed/moderate sized internal hemorrhoids   FLEXIBLE SIGMOIDOSCOPY N/A 06/09/2016   Procedure: FLEXIBLE SIGMOIDOSCOPY;  Surgeon: Danie Binder, MD;  [facility];  Service: Endoscopy;  Laterality: N/A;  rectal polyps times 2   FLEXIBLE SIGMOIDOSCOPY N/A 03/18/2018   Procedure: FLEXIBLE SIGMOIDOSCOPY;  Surgeon: Danie Binder, MD;  [facility];  Service: Endoscopy;  Laterality: N/A;  9:15am   FOOT SURGERY     bunion removal left   GIVENS CAPSULE STUDY N/A 02/10/2013   Procedure: GIVENS CAPSULE STUDY;  Surgeon: Danie Binder, MD;  [facility];  Service: Endoscopy;  Laterality: N/A;  [place]     right    HEMORRHOID BANDING N/A 03/14/2013   Procedure: HEMORRHOID BANDING (Procedure #3)  3 bands applied [id number] Exp 02/02/2014 ;  Surgeon: Danie Binder, MD;  Location: [facility];  Service: Endoscopy;  Laterality: N/A;   HEMORRHOID SURGERY N/A 04/24/2016   Procedure: EXTENSIVE HEMORRHOIDECTOMY;  Surgeon: Vickie Epley, MD;  Location: [facility];  Service: General;  Laterality: N/A;   LAPAROSCOPY     adhesions   POLYPECTOMY N/A 03/14/2013   TSV:XBLT Gastritis . ULCERS SEEN ON MAY 6 HAVE HEALED   POLYPECTOMY  03/31/2016   Procedure: POLYPECTOMY;  Surgeon: Danie Binder, MD;  [facility];  Service: Endoscopy;;  sigmoid colon polyps x2, rectal polyps x2   POLYPECTOMY  04/21/2021   Procedure: POLYPECTOMY;  Surgeon: Eloise Harman, DO;  [facility];  Service: Endoscopy;;   RECTAL  EXAM UNDER ANESTHESIA N/A 07/23/2021   Procedure: RECTAL EXAM UNDER ANESTHESIA;  Surgeon: Leighton Ruff, MD;  Location: [facility];  Service: General;  Laterality: N/A;   SPHINCTEROTOMY N/A 07/23/2021   Procedure: CHEMICAL SPHINCTEROTOMY;  Surgeon: Leighton Ruff, MD;  Location: [place];  Service: General;  Laterality: N/A;   TONSILLECTOMY     TUBAL LIGATION      Family History  Problem Relation Age of Onset   Colon cancer Mother        37s   Urticaria Mother    Heart disease Mother    Colon polyps Mother    Cancer Father        oral   Crohn's disease Sister    Cancer Sister  around heart   Colon polyps Sister    Diabetes Brother    Other Brother        intestinal sugery   Colon polyps Brother    Colon cancer Maternal Grandmother    Colon cancer Maternal Grandfather    Colon cancer Paternal Grandfather    Anesthesia problems Neg Hx     Social History   Socioeconomic History   Marital status: Widowed    Spouse name: Not on file   Number of children: 1   Years of education: Not on file   Highest education level: Not on file  Occupational History   Occupation: homemaker    Employer: UNEMPLOYED  Tobacco Use   Smoking status: Former    Packs/day: 0.50    Years: 30.00    Total pack years: 15.00    Types: Cigarettes    Quit date: 10/15/1996    Years since quitting: 25.5   Smokeless tobacco: Never   Tobacco comments:    Stopped smoking ~ 2000  Vaping Use   Vaping Use: Never used  Substance and Sexual Activity   Alcohol use: Yes    Alcohol/week: 4.0 standard drinks of alcohol    Types: 4 Glasses of wine per week    Comment: 1 drinks every couple of weeks   Drug use: No   Sexual activity: Yes    Birth control/protection: Surgical  Other Topics Concern   Not on file  Social History Narrative   Does not routinely exercise. Husband passed in JUN 2015 due to prostate ca.   Social Determinants of Health   Financial  Resource Strain: Low Risk  (04/02/2021)   Overall Financial Resource Strain (CARDIA)    Difficulty of Paying Living Expenses: Not hard at all  Food Insecurity: No Food Insecurity (05/22/2021)   Hunger Vital Sign    Worried About Running Out of Food in the Last Year: Never true    Ran Out of Food in the Last Year: Never true  Transportation Needs: Unmet Transportation Needs (05/01/2022)   PRAPARE - Transportation    Lack of Transportation (Medical): Yes    Lack of Transportation (Non-Medical): Yes  Physical Activity: Sufficiently Active (04/02/2021)   Exercise Vital Sign    Days of Exercise per Week: 7 days    Minutes of Exercise per Session: 60 min  Stress: No Stress Concern Present (04/02/2021)   [place]    Feeling of Stress : Not at all  Social Connections: Socially Isolated (04/02/2021)   Social Connection and Isolation Panel [NHANES]    Frequency of Communication with Friends and Family: More than three times a week    Frequency of Social Gatherings with Friends and Family: Never    Attends Religious Services: Never    Marine scientist or Organizations: No    Attends Archivist Meetings: Never    Marital Status: Widowed  Intimate Partner Violence: Not At Risk (04/02/2021)   Humiliation, Afraid, Rape, and Kick questionnaire    Fear of Current or Ex-Partner: No    Emotionally Abused: No    Physically Abused: No    Sexually Abused: No    Outpatient Medications Prior to Visit  Medication Sig Dispense Refill   albuterol (PROVENTIL) (2.5 MG/3ML) 0.083% nebulizer solution Take 3 mLs (2.5 mg total) by nebulization every 4 (four) hours as needed for wheezing or shortness of breath. 150 mL 3   albuterol (VENTOLIN HFA) 108 (90 Base) MCG/ACT  inhaler Inhale 2 puffs into the lungs every 4 (four) hours as needed for wheezing. 54 g 3   ALPRAZolam (XANAX) 1 MG tablet Take 1 tablet (1 mg total) by mouth 2 (two)  times daily as needed for anxiety. (Patient taking differently: Take 0.5-1 mg by mouth at bedtime.) 180 tablet 3   brimonidine (ALPHAGAN) 0.2 % ophthalmic solution Place 1 drop into both eyes 2 (two) times daily. 15 mL 3   budesonide-formoterol (SYMBICORT) 160-4.5 MCG/ACT inhaler Inhale 2 puffs into the lungs as needed. 10.2 g 3   Cholecalciferol (VITAMIN D3) 50 MCG (2000 UT) TABS Take 2,000 Units by mouth daily.     cyclobenzaprine (FLEXERIL) 10 MG tablet TAKE ONE TABLET BY MOUTH DAILY  as needed muscle spasms (Patient taking differently: Take 10 mg by mouth daily as needed (Arthritis and torn rotator cuff left).) 90 tablet 3   dexlansoprazole (DEXILANT) 60 MG capsule Take 1 capsule (60 mg total) by mouth daily. 90 capsule 3   dicyclomine (BENTYL) 10 MG capsule Take one capsule before meals and at bedtime AS NEEDED for abdominal pain and frequent stool. HOLD for constipation (Patient taking differently: Take 10 mg by mouth daily as needed (IBS).) 120 capsule 1   DUREZOL 0.05 % EMUL Place 1 drop into the right eye daily.     estradiol (ESTRACE VAGINAL) 0.1 MG/GM vaginal cream Place 1 Applicatorful vaginally daily as needed. 42.5 g 3   fenofibrate (TRICOR) 145 MG tablet Take 1 tablet (145 mg total) by mouth daily. 90 tablet 3   HYDROcodone-acetaminophen (NORCO/VICODIN) 5-325 MG tablet Take 1 tablet by mouth every 6 (six) hours as needed for moderate pain. 30 tablet 0   hyoscyamine (ANASPAZ) 0.125 MG TBDP disintergrating tablet Place 1 tablet (0.125 mg total) under the tongue daily as needed (IBS). Place 0.125 mg under the tongue daily as needed (IBS). 30 tablet 0   irbesartan (AVAPRO) 150 MG tablet Take 0.5 tablets (75 mg total) by mouth daily. 45 tablet 3   latanoprost (XALATAN) 0.005 % ophthalmic solution Place 1 drop into both eyes at bedtime. 9 mL 3   levothyroxine (SYNTHROID) 25 MCG tablet Take 1 tablet (25 mcg total) by mouth daily before breakfast. 95 tablet 1   Lidocaine-Hydrocortisone Ace  3-2.5 % KIT APPLY TO RECTUM QID for 2 weeks then AS NEEDED FOR RECTAL PAIN OR BLEEDING 1 kit 11   Lifitegrast (XIIDRA) 5 % SOLN INSTILL 1 DROP IN BOTH EYES EVERY DAY AS NEEDED (EACH CONTAINER SHOULD YIELD 2 DROPS) STORE IN ORIGINAL FOIL POUCH (Patient taking differently: Place 1 drop into both eyes daily. (EACH CONTAINER SHOULD YIELD 2 DROPS) STORE IN ORIGINAL FOIL POUCH) 60 each 3   Magnesium 500 MG TABS Take 1 tablet by mouth daily.     meclizine (ANTIVERT) 12.5 MG tablet Take 1 tablet (12.5 mg total) by mouth 3 (three) times daily as needed for dizziness. (Patient taking differently: Take 12.5 mg by mouth 3 (three) times daily as needed (vertigo).) 30 tablet 3   Menthol, Topical Analgesic, (BIOFREEZE EX) Apply 1 application topically daily as needed (pain).     metroNIDAZOLE (METROCREAM) 0.75 % cream Apply 1 application. topically as needed. 45 g 3   naproxen (NAPROSYN) 500 MG tablet Take 1 tablet (500 mg total) by mouth daily as needed for moderate pain or mild pain. 30 tablet 0   neomycin-polymyxin b-dexamethasone (MAXITROL) 3.5-10000-0.1 OINT Place 1 application into both eyes at bedtime as needed. (Patient taking differently: Place 1 application  into  both eyes at bedtime as needed (Dry eyes).) 10.5 g 3   nitroGLYCERIN (NITROLINGUAL) 0.4 MG/SPRAY spray Place 1 spray under the tongue every 5 (five) minutes as needed for chest pain. 12 g 3   NON FORMULARY Take 1 tablet by mouth daily as needed (Stomach Bloat). FD GUARD     ondansetron (ZOFRAN) 4 MG tablet Take 1 tablet (4 mg total) by mouth every 8 (eight) hours as needed for nausea or vomiting. 30 tablet 1   prednisoLONE acetate (PRED FORTE) 1 % ophthalmic suspension Place 1 drop into the right eye 3 (three) times daily.     Probiotic Product (PROBIOTIC DAILY PO) Take 1 capsule by mouth daily. VSL #3     UNABLE TO FIND Place 1 application rectally daily as needed. [place] Name: lidocaine 5% pramoxine 1% ointment per rectum as  needed.     chlorpheniramine-HYDROcodone (TUSSIONEX PENNKINETIC ER) 10-8 MG/5ML SUER Take 5 mLs by mouth every 12 (twelve) hours as needed. (Patient taking differently: Take 5 mLs by mouth every 12 (twelve) hours as needed for cough.) 140 mL 0   diazepam (VALIUM) 5 MG tablet Take one tablet by mouth with food one hour prior to procedure. May repeat 30 minutes prior if needed. 2 tablet 0   methylPREDNISolone (MEDROL DOSEPAK) 4 MG TBPK tablet Take as instructed on the packaging 1 each 0   zolpidem (AMBIEN) 10 MG tablet Take 1 tablet (10 mg total) by mouth at bedtime as needed for sleep. 90 tablet 0   No facility-administered medications prior to visit.    Allergies  Allergen Reactions   Nurtec [Rimegepant Sulfate] Anaphylaxis   Nutmeg Oil (Myristica Oil) Anaphylaxis    Nut oil- skin irritation. Nut oil- skin irritation.   Adhesive [Tape] Other (See Comments)    Blisters skin   Aspirin Other (See Comments)    sickle cell trait--  Not recommended unless emergency   Imitrex [Sumatriptan] Hives   Keflex [Cephalexin] Other (See Comments)    G6PD   Levaquin [Levofloxacin In D5w]     Altered mental status Affects G6PD   Other     Nut Oil- skin irritation    Statins Other (See Comments)    Myopathy - elevated CK Myopathy - elevated CK Myopathy-elevated CK   Sulfa Antibiotics Other (See Comments)    Patient has sickle cell trait Sickle cell trait.   Sulfonamide Derivatives Other (See Comments)    Patient has sickle cell trait   Latex Rash    ROS Review of Systems  Constitutional:  Negative for chills and fever.  HENT:  Negative for congestion, sinus pressure, sinus pain and sore throat.   Eyes:  Positive for photophobia, pain and visual disturbance. Negative for discharge.  Respiratory:  Positive for cough, shortness of breath and wheezing.   Cardiovascular:  Negative for chest pain and palpitations.  Gastrointestinal:  Positive for constipation. Negative for abdominal pain, blood  in stool, diarrhea, nausea and vomiting.  Endocrine: Negative for polydipsia and polyuria.  Genitourinary:  Negative for dysuria and hematuria.  Musculoskeletal:  Positive for back pain. Negative for neck pain and neck stiffness.  Skin:  Negative for rash.  Neurological:  Positive for headaches. Negative for dizziness and weakness.  Psychiatric/Behavioral:  Negative for agitation and behavioral problems.       Objective:    Physical Exam Vitals reviewed.  Constitutional:      General: She is not in acute distress.    Appearance: She is not diaphoretic.  HENT:     Head: Normocephalic and atraumatic.     Nose: Nose normal.     Mouth/Throat:     Mouth: Mucous membranes are moist.  Eyes:     General: No scleral icterus.    Extraocular Movements: Extraocular movements intact.  Cardiovascular:     Rate and Rhythm: Normal rate and regular rhythm.     Pulses: Normal pulses.     Heart sounds: Normal heart sounds. No murmur heard. Pulmonary:     Breath sounds: Wheezing (Mild, wheezing) present. No rales.  Musculoskeletal:        General: Tenderness (Lower lumbar spine area) present.     Cervical back: Neck supple. No tenderness.     Right lower leg: No edema.     Left lower leg: No edema.  Skin:    General: Skin is warm.     Findings: No rash.  Neurological:     General: No focal deficit present.     Mental Status: She is alert and oriented to person, place, and time.     Sensory: No sensory deficit.     Motor: No weakness.  Psychiatric:        Mood and Affect: Mood normal.        Behavior: Behavior normal.     BP 128/84 (BP Location: Right Arm, Patient Position: Sitting, Cuff Size: Normal)   Pulse 88   Resp 20   Ht 5' 7"  (1.702 m)   Wt 209 lb 9.6 oz (95.1 kg)   SpO2 97%   BMI 32.83 kg/m  Wt Readings from Last 3 Encounters:  05/14/22 210 lb 6.4 oz (95.4 kg)  05/14/22 209 lb 9.6 oz (95.1 kg)  05/04/22 214 lb 3.2 oz (97.2 kg)    Lab Results  Component Value Date    TSH 2.306 04/27/2022   Lab Results  Component Value Date   WBC 6.9 12/11/2020   HGB 13.6 07/23/2021   HCT 40.0 07/23/2021   MCV 93.1 12/11/2020   PLT 264 12/11/2020   Lab Results  Component Value Date   NA 136 04/27/2022   K 3.6 04/27/2022   CO2 22 04/27/2022   GLUCOSE 130 (H) 04/27/2022   BUN 14 04/27/2022   CREATININE 1.10 (H) 04/27/2022   BILITOT 1.4 (H) 04/27/2022   ALKPHOS 33 (L) 04/27/2022   AST 65 (H) 04/27/2022   ALT 36 04/27/2022   PROT 7.3 04/27/2022   ALBUMIN 4.1 04/27/2022   CALCIUM 9.3 04/27/2022   ANIONGAP 7 04/27/2022   EGFR 56 (L) 01/20/2022   Lab Results  Component Value Date   CHOL 205 (H) 01/20/2022   Lab Results  Component Value Date   HDL 26 (L) 01/20/2022   Lab Results  Component Value Date   LDLCALC 139 (H) 01/20/2022   Lab Results  Component Value Date   TRIG 222 (H) 01/20/2022   Lab Results  Component Value Date   CHOLHDL 7.9 (H) 01/20/2022   Lab Results  Component Value Date   HGBA1C 5.2 01/20/2022      Assessment & Plan:   Problem List Items Addressed This Visit       Cardiovascular and Mediastinum   Essential hypertension, benign     BP Readings from Last 1 Encounters:  05/14/22 120/68  Well-controlled with Irbesartan 75 mg QD Counseled for compliance with the medications Advised DASH diet and moderate exercise/walking as tolerated        Respiratory   Asthma    Recent asthma  exacerbation or acute bronchitis On oral prednisone On Symbicort, trial of Trelegy as she has severe symptoms despite using Symbicort Albuterol PRN for dyspnea or wheezing      Acute bronchitis - Primary    S/p oral abx On oral prednisone - symptoms slightly better Switched inhaler to Trelegy for asthma Tussionex PRN for cough      Relevant Medications   Hydrocod Polst-Chlorphen Polst (CHLORPHENIRAMINE-HYDROCODONE) 10-8 MG/5ML     Endocrine   Hypothyroidism    On Levothyroxine Follows up with Dr Dorris Fetch        Other    Anxiety    H/o panic disorder as well Xanax 1 mg BID PRN, takes it only for panic episodes      Chronic insomnia    Takes Ambien 10 mg qHS PRN      Relevant Medications   zolpidem (AMBIEN) 10 MG tablet    Meds ordered this encounter  Medications   Hydrocod Polst-Chlorphen Polst (CHLORPHENIRAMINE-HYDROCODONE) 10-8 MG/5ML    Sig: Take 5 mLs by mouth every 12 (twelve) hours as needed for cough.    Dispense:  115 mL    Refill:  0   zolpidem (AMBIEN) 10 MG tablet    Sig: Take 1 tablet (10 mg total) by mouth at bedtime as needed for sleep.    Dispense:  90 tablet    Refill:  1    FAX YE:[facility] BY MAIL- [facility] - [phone]    Follow-up: Return in about 4 months (around 09/13/2022) for HTN and asthma.    Lindell Spar, MD

## 2022-05-14 NOTE — Progress Notes (Signed)
Cardiology Office Note  Date: 05/14/2022   ID: [name], DOB May 02, 1951, MRN [id number]  PCP:  Lindell Spar, MD  Cardiologist:  Rozann Lesches, MD Electrophysiologist:  None   Chief Complaint  Patient presents with   Cardiac follow-up    History of Present Illness: [name] is a 71 y.o. female last seen in January.  She is here for a follow-up visit.  States that she traveled to see a [place]  Reports high heat and humidity, also possibly mold in the house.  She recalls being short of breath and tight in her chest, also felt somewhat bloated.  The symptoms improved after she came back home.  Still has a little congestion.  No fevers or chills.  She saw her PCP this morning, medications are stable and outlined below.  Also continues to follow with endocrinology.  I did review her most recent lipid panel from April at which point LDL was 139, triglycerides 222.  She has a history of statin intolerance and CK elevation.  Currently on Tricor.  10-year cardiovascular risk estimate is 12% based on standard calculator.  Not certain that she would qualify for a PCSK9 inhibitor based on this, Zetia would be a potential option.  Past Medical History:  Diagnosis Date   Anal fissure    Anxiety    Arthritis    Asthma    C. difficile diarrhea 09/17/2021   Cataract    Phreesia 01/05/2021   Chest pain    a. 02/2000 Cath: nl cors, EF 60%;  b. 10/2010 MV: nl LV, no ischemia/infarct;  c. 03/2014 Admit c/p, r/o->grief from husbands death.   Chronic RUQ pain 11-25-2007   EUS slightly dilated CBD (7.75m), otherwise nl   Depression    Diverticulosis    Eczema    Exposure to chemical inhalation mid 1970s   Fatty liver    G6PD deficiency    Gallstones    GERD (gastroesophageal reflux disease)    Glaucoma    History of pulmonary embolus (PE)    Treated with Eliquis 202/20/16  HTN (hypertension)    Hypercholesteremia    a. intolerant to statins.    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 She has limited vision in the right eye.   Migraines    inner ocular   Ocular migraine    Palpitations    Pleurisy    Pneumonia 07/16/2021   Patient was exposed to a harsh chemical in the 1970's that created scar tissue in her lungs. She has had pneumonia several times in the past.   PONV (postoperative nausea and vomiting)    Pulmonary emboli (HCC)    Recurrent upper respiratory infection (URI)    Renal cyst    Retinal cyst    Sickle cell trait (HCC)    Tubular adenoma of colon 09/17/2011   Urticaria     Past Surgical History:  Procedure Laterality Date   ABDOMINAL HYSTERECTOMY     ADENOIDECTOMY     ANGIOPLASTY     APPENDECTOMY     BIOPSY N/A 03/14/2013   Procedure: SMALL BOWEL AND GASTRIC BIOPSIES (Procedure #1);  Surgeon: SDanie Binder MD;  Location: [facility];  Service: Endoscopy;  Laterality: N/A;   CARDIAC CATHETERIZATION Left 02/11/2000   CATARACT EXTRACTION Left  CATARACT EXTRACTION W/PHACO Right 03/19/2021   Procedure: CATARACT EXTRACTION PHACO AND INTRAOCULAR LENS PLACEMENT ([place]) RIGHT 6.75 00:50.4;  Surgeon: Leandrew Koyanagi, MD;  Location: [facility];  Service: Ophthalmology;  Laterality: Right;   CHOLECYSTECTOMY  12/2007   COLONOSCOPY  12/22/2010   VEH:MCNOBS, cecal adenomatous polyp   COLONOSCOPY WITH PROPOFOL N/A 03/31/2016   Dr. Oneida Alar: four sessile polyps rectum/sigmoid colon, diverticulosi, ext/int hemorrhoids. hyperplastic polyps, next tcs 5 years.   COLONOSCOPY WITH PROPOFOL N/A 04/21/2021   anal fissure, non-bleeding internal hemorrhoids, right colon diverticulosis, one 2 mm polyp in ascending, three 4-6 mm polyps in descending colon. Tubular adenomas. 5 year surveillance.   complete hysterectomy     ENTEROSCOPY N/A 03/14/2013   JGG:EZMO gastritis/ulcers has healed   ESOPHAGOGASTRODUODENOSCOPY  09/22/2011    QHU:TMLY gastritis/Duodenitis   EXCISIONAL HEMORRHOIDECTOMY     FLEXIBLE SIGMOIDOSCOPY N/A 03/14/2013   SLF:3 colon polyp removed/moderate sized internal hemorrhoids   FLEXIBLE SIGMOIDOSCOPY N/A 06/09/2016   Procedure: FLEXIBLE SIGMOIDOSCOPY;  Surgeon: Danie Binder, MD;  [facility];  Service: Endoscopy;  Laterality: N/A;  rectal polyps times 2   FLEXIBLE SIGMOIDOSCOPY N/A 03/18/2018   Procedure: FLEXIBLE SIGMOIDOSCOPY;  Surgeon: Danie Binder, MD;  [facility];  Service: Endoscopy;  Laterality: N/A;  9:15am   FOOT SURGERY     bunion removal left   GIVENS CAPSULE STUDY N/A 02/10/2013   Procedure: GIVENS CAPSULE STUDY;  Surgeon: Danie Binder, MD;  [facility];  Service: Endoscopy;  Laterality: N/A;  [place]     right    HEMORRHOID BANDING N/A 03/14/2013   Procedure: HEMORRHOID BANDING (Procedure #3)  3 bands applied [id number] Exp 02/02/2014 ;  Surgeon: Danie Binder, MD;  Location: [facility];  Service: Endoscopy;  Laterality: N/A;   HEMORRHOID SURGERY N/A 04/24/2016   Procedure: EXTENSIVE HEMORRHOIDECTOMY;  Surgeon: Vickie Epley, MD;  Location: [facility];  Service: General;  Laterality: N/A;   LAPAROSCOPY     adhesions   POLYPECTOMY N/A 03/14/2013   EXN:TZGY Gastritis . ULCERS SEEN ON MAY 6 HAVE HEALED   POLYPECTOMY  03/31/2016   Procedure: POLYPECTOMY;  Surgeon: Danie Binder, MD;  [facility];  Service: Endoscopy;;  sigmoid colon polyps x2, rectal polyps x2   POLYPECTOMY  04/21/2021   Procedure: POLYPECTOMY;  Surgeon: Eloise Harman, DO;  [facility];  Service: Endoscopy;;   RECTAL EXAM UNDER ANESTHESIA N/A 07/23/2021   Procedure: RECTAL EXAM UNDER ANESTHESIA;  Surgeon: Leighton Ruff, MD;  Location: [facility];  Service: General;  Laterality: N/A;   SPHINCTEROTOMY N/A 07/23/2021   Procedure: CHEMICAL SPHINCTEROTOMY;  Surgeon: Leighton Ruff, MD;  Location: [facility];  Service: General;  Laterality: N/A;   TONSILLECTOMY     TUBAL LIGATION      Current Outpatient Medications  Medication Sig Dispense Refill   albuterol (PROVENTIL) (2.5 MG/3ML) 0.083% nebulizer solution Take 3 mLs (2.5 mg total) by nebulization every 4 (four) hours as needed for wheezing or shortness of breath. 150 mL 3   albuterol (VENTOLIN HFA) 108 (90 Base) MCG/ACT inhaler Inhale 2 puffs into the lungs every 4 (four) hours as needed for wheezing. 54 g 3   ALPRAZolam (XANAX) 1 MG tablet Take 1 tablet (1 mg total) by mouth 2 (two) times daily as needed for anxiety. (Patient taking differently: Take 0.5-1 mg by mouth at bedtime.) 180 tablet 3   brimonidine (ALPHAGAN)  0.2 % ophthalmic solution Place 1 drop into both eyes 2 (two) times daily. 15 mL 3   budesonide-formoterol (SYMBICORT) 160-4.5 MCG/ACT inhaler Inhale 2 puffs into the lungs as needed. 10.2 g 3   Cholecalciferol (VITAMIN D3) 50 MCG (2000 UT) TABS Take 2,000 Units by mouth daily.     cyclobenzaprine (FLEXERIL) 10 MG tablet TAKE ONE TABLET BY MOUTH DAILY  as needed muscle spasms (Patient taking differently: Take 10 mg by mouth daily as needed (Arthritis and torn rotator cuff left).) 90 tablet 3   dexlansoprazole (DEXILANT) 60 MG capsule Take 1 capsule (60 mg total) by mouth daily. 90 capsule 3   dicyclomine (BENTYL) 10 MG capsule Take one capsule before meals and at bedtime AS NEEDED for abdominal pain and frequent stool. HOLD for constipation (Patient taking differently: Take 10 mg by mouth daily as needed (IBS).) 120 capsule 1   DUREZOL 0.05 % EMUL Place 1 drop into the right eye daily.     estradiol (ESTRACE VAGINAL) 0.1 MG/GM vaginal cream Place 1 Applicatorful vaginally daily as needed. 42.5 g 3   fenofibrate (TRICOR) 145 MG tablet Take 1 tablet (145 mg total) by mouth daily. 90 tablet 3   Hydrocod Polst-Chlorphen Polst (CHLORPHENIRAMINE-HYDROCODONE) 10-8 MG/5ML Take 5 mLs by mouth every 12 (twelve) hours as needed for  cough. 115 mL 0   HYDROcodone-acetaminophen (NORCO/VICODIN) 5-325 MG tablet Take 1 tablet by mouth every 6 (six) hours as needed for moderate pain. 30 tablet 0   hyoscyamine (ANASPAZ) 0.125 MG TBDP disintergrating tablet Place 1 tablet (0.125 mg total) under the tongue daily as needed (IBS). Place 0.125 mg under the tongue daily as needed (IBS). 30 tablet 0   irbesartan (AVAPRO) 150 MG tablet Take 0.5 tablets (75 mg total) by mouth daily. 45 tablet 3   latanoprost (XALATAN) 0.005 % ophthalmic solution Place 1 drop into both eyes at bedtime. 9 mL 3   levothyroxine (SYNTHROID) 25 MCG tablet Take 1 tablet (25 mcg total) by mouth daily before breakfast. 95 tablet 1   Lidocaine-Hydrocortisone Ace 3-2.5 % KIT APPLY TO RECTUM QID for 2 weeks then AS NEEDED FOR RECTAL PAIN OR BLEEDING 1 kit 11   Lifitegrast (XIIDRA) 5 % SOLN INSTILL 1 DROP IN BOTH EYES EVERY DAY AS NEEDED (EACH CONTAINER SHOULD YIELD 2 DROPS) STORE IN ORIGINAL FOIL POUCH (Patient taking differently: Place 1 drop into both eyes daily. (EACH CONTAINER SHOULD YIELD 2 DROPS) STORE IN ORIGINAL FOIL POUCH) 60 each 3   Magnesium 500 MG TABS Take 1 tablet by mouth daily.     meclizine (ANTIVERT) 12.5 MG tablet Take 1 tablet (12.5 mg total) by mouth 3 (three) times daily as needed for dizziness. (Patient taking differently: Take 12.5 mg by mouth 3 (three) times daily as needed (vertigo).) 30 tablet 3   Menthol, Topical Analgesic, (BIOFREEZE EX) Apply 1 application topically daily as needed (pain).     metroNIDAZOLE (METROCREAM) 0.75 % cream Apply 1 application. topically as needed. 45 g 3   naproxen (NAPROSYN) 500 MG tablet Take 1 tablet (500 mg total) by mouth daily as needed for moderate pain or mild pain. 30 tablet 0   neomycin-polymyxin b-dexamethasone (MAXITROL) 3.5-10000-0.1 OINT Place 1 application into both eyes at bedtime as needed. (Patient taking differently: Place 1 application  into both eyes at bedtime as needed (Dry eyes).) 10.5 g 3    nitroGLYCERIN (NITROLINGUAL) 0.4 MG/SPRAY spray Place 1 spray under the tongue every 5 (five) minutes as needed for chest pain. 12  g 3   NON FORMULARY Take 1 tablet by mouth daily as needed (Stomach Bloat). FD GUARD     ondansetron (ZOFRAN) 4 MG tablet Take 1 tablet (4 mg total) by mouth every 8 (eight) hours as needed for nausea or vomiting. 30 tablet 1   prednisoLONE acetate (PRED FORTE) 1 % ophthalmic suspension Place 1 drop into the right eye 3 (three) times daily.     Probiotic Product (PROBIOTIC DAILY PO) Take 1 capsule by mouth daily. VSL #3     UNABLE TO FIND Place 1 application rectally daily as needed. Heath Name: lidocaine 5% pramoxine 1% ointment per rectum as needed.     zolpidem (AMBIEN) 10 MG tablet Take 1 tablet (10 mg total) by mouth at bedtime as needed for sleep. 90 tablet 1   No current facility-administered medications for this visit.   Allergies:  Nurtec [rimegepant sulfate], Nutmeg oil (myristica oil), Adhesive [tape], Aspirin, Imitrex [sumatriptan], Keflex [cephalexin], Levaquin [levofloxacin in d5w], Other, Statins, Sulfa antibiotics, Sulfonamide derivatives, and Latex   ROS: No palpitations or syncope.  Physical Exam: VS:  BP 120/68   Pulse 90   Ht _0  (1.702 m)   Wt 210 lb 6.4 oz (95.4 kg)   SpO2 94%   BMI 32.95 kg/m , BMI Body mass index is 32.95 kg/m.  Wt Readings from Last 3 Encounters:  05/14/22 210 lb 6.4 oz (95.4 kg)  05/14/22 209 lb 9.6 oz (95.1 kg)  05/04/22 214 lb 3.2 oz (97.2 kg)    General: Patient appears comfortable at rest. HEENT: Conjunctiva and lids normal. Neck: Supple, no elevated JVP or carotid bruits, no thyromegaly. Lungs: Decreased breath sounds without wheezing.  Nonlabored at rest. Cardiac: Regular rate and rhythm, no S3 or significant systolic murmur, no pericardial rub. Extremities: No pitting edema.  ECG:  An ECG dated 10/23/2021 was personally reviewed today and demonstrated:  Sinus rhythm with  nonspecific T wave changes and low voltage.  Recent Labwork: 07/23/2021: Hemoglobin 13.6 04/27/2022: ALT 36; AST 65; BUN 14; Creatinine, Ser 1.10; Magnesium 1.6; Potassium 3.6; Sodium 136; TSH 2.306     Component Value Date/Time   CHOL 205 (H) 01/20/2022 1433   CHOL 254 07/12/2012 0755   TRIG 222 (H) 01/20/2022 1433   TRIG 325 07/12/2012 0755   HDL 26 (L) 01/20/2022 1433   CHOLHDL 7.9 (H) 01/20/2022 1433   CHOLHDL 6.9 (H) 12/11/2020 1023   VLDL 42 (H) 05/07/2017 0918   LDLCALC 139 (H) 01/20/2022 1433   LDLCALC 156 (H) 12/11/2020 1023    Other Studies Reviewed Today:  Echocardiogram 08/23/2019:  1. Left ventricular ejection fraction, by visual estimation, is 65 to  70%. The left ventricle has normal function. There is mildly increased  left ventricular hypertrophy.   2. Left ventricular diastolic parameters are consistent with Grade I  diastolic dysfunction (impaired relaxation).   3. The left ventricle has no regional wall motion abnormalities.   4. Global right ventricle has normal systolic function.The right  ventricular size is normal. No increase in right ventricular wall  thickness.   5. Left atrial size was normal.   6. Right atrial size was normal.   7. The mitral valve is grossly normal. No evidence of mitral valve  regurgitation.   8. The tricuspid valve is grossly normal. Tricuspid valve regurgitation  is not demonstrated.   9. The aortic valve is tricuspid. Aortic valve regurgitation is not  visualized. No evidence of aortic valve sclerosis or stenosis.  10. The pulmonic valve was grossly normal. Pulmonic valve regurgitation is  not visualized.  11. The inferior vena cava is normal in size with greater than 50%  respiratory variability, suggesting right atrial pressure of 3 mmHg.    Lexiscan Myoview 02/04/2021: There was no ST segment deviation noted during stress. The left ventricular ejection fraction is hyperdynamic (>65%). This is a low risk study. The  study is normal. There are no perfusion defects  ABIs 11/18/2021: Summary:  Right: Resting right ankle-brachial index is within normal range. No  evidence of significant right lower extremity arterial disease. The right  toe-brachial index is normal.   Left: Resting left ankle-brachial index is within normal range. No  evidence of significant left lower extremity arterial disease. The left  toe-brachial index is normal.   Assessment and Plan:  1.  Symptoms of shortness of breath and chest tightness in the setting of high heat and humidity, possibly also mold in her environment during recent travels.  Feeling better in general with some mild chest congestion.  She saw her PCP this morning.  We will obtain an echocardiogram to ensure no development of cardiomyopathy in comparison to assessment in 2020.  Sounds like symptoms are perhaps more related to respiratory etiology however.  2.  Mixed hyperlipidemia with statin intolerance and prior CK elevation.  She is currently on Tricor with triglycerides 222 and her recent HDL 139.  Estimated 10-year cardiovascular risk is 12% based on standard calculator.  Not certain that this would qualify her for a PCSK9 inhibitor, but Zetia would always be a consideration.  Continue to work on diet.  3.  Essential hypertension, blood pressure is normal today on Avapro.  4.  History of chest pain over time, possibly endothelial dysfunction with prior normal coronary angiography and low risk follow-up Myoview in May of last year.  Medication Adjustments/Labs and Tests Ordered: Current medicines are reviewed at length with the patient today.  Concerns regarding medicines are outlined above.   Tests Ordered: Orders Placed This Encounter  Procedures   ECHOCARDIOGRAM COMPLETE    Medication Changes: No orders of the defined types were placed in this encounter.   Disposition:  Follow up  test results.  Signed, Satira Sark, MD, [facility] 05/14/2022 1:26  PM    [facility] [place], [place], [place] [address] Phone: [phone]; Fax: [phone]

## 2022-05-14 NOTE — Patient Instructions (Signed)
Please continue taking medications as prescribed.  Please use Trelegy instead of Symbicort and let us know how it works.  Please continue to follow low salt diet and ambulate as tolerated.

## 2022-05-14 NOTE — Patient Instructions (Addendum)
Medication Instructions:  Your physician recommends that you continue on your current medications as directed. Please refer to the Current Medication list given to you today.  Labwork: none  Testing/Procedures: Your physician has requested that you have an echocardiogram. Echocardiography is a painless test that uses sound waves to create images of your heart. It provides your doctor with information about the size and shape of your heart and how well your heart's chambers and valves are working. This procedure takes approximately one hour. There are no restrictions for this procedure.  Follow-Up: Your physician recommends that you schedule a follow-up appointment in: pending  Any Other Special Instructions Will Be Listed Below (If Applicable).  If you need a refill on your cardiac medications before your next appointment, please call your pharmacy.

## 2022-05-15 NOTE — Assessment & Plan Note (Signed)
On Levothyroxine Follows up with Dr Dorris Fetch

## 2022-05-15 NOTE — Assessment & Plan Note (Addendum)
S/p oral abx On oral prednisone - symptoms slightly better Switched inhaler to Trelegy for asthma Tussionex PRN for cough

## 2022-05-15 NOTE — Assessment & Plan Note (Signed)
  BP Readings from Last 1 Encounters:  05/14/22 120/68   Well-controlled with Irbesartan 75 mg QD Counseled for compliance with the medications Advised DASH diet and moderate exercise/walking as tolerated

## 2022-05-15 NOTE — Assessment & Plan Note (Signed)
H/o panic disorder as well Xanax 1 mg BID PRN, takes it only for panic episodes

## 2022-05-15 NOTE — Assessment & Plan Note (Addendum)
Recent asthma exacerbation or acute bronchitis On oral prednisone On Symbicort, trial of Trelegy as she has severe symptoms despite using Symbicort Albuterol PRN for dyspnea or wheezing

## 2022-05-15 NOTE — Assessment & Plan Note (Signed)
Takes Ambien 10 mg qHS PRN

## 2022-05-18 ENCOUNTER — Ambulatory Visit (INDEPENDENT_AMBULATORY_CARE_PROVIDER_SITE_OTHER): Payer: Medicare Other

## 2022-05-18 VITALS — BP 96/66 | HR 86 | Ht 67.0 in | Wt 211.1 lb

## 2022-05-18 DIAGNOSIS — Z0000 Encounter for general adult medical examination without abnormal findings: Secondary | ICD-10-CM

## 2022-05-18 NOTE — Patient Instructions (Signed)
  Ms. Ramstad , Thank you for taking time to come for your Medicare Wellness Visit. I appreciate your ongoing commitment to your health goals. Please review the following plan we discussed and let me know if I can assist you in the future.   These are the goals we discussed:  Goals      Patient Stated     None.        This is a list of the screening recommended for you and due dates:  Health Maintenance  Topic Date Due   Flu Shot  05/05/2022   Mammogram  07/01/2023   Tetanus Vaccine  08/03/2024   Colon Cancer Screening  04/22/2031   Pneumonia Vaccine  Completed   DEXA scan (bone density measurement)  Completed   COVID-19 Vaccine  Completed   Hepatitis C Screening: USPSTF Recommendation to screen - Ages 66-79 yo.  Completed   Zoster (Shingles) Vaccine  Completed   HPV Vaccine  Aged Out

## 2022-05-18 NOTE — Progress Notes (Signed)
Subjective:   [name] is a 71 y.o. female who presents for Medicare Annual (Subsequent) preventive examination.  Review of Systems     [name] , Thank you for taking time to come for your Medicare Wellness Visit. I appreciate your ongoing commitment to your health goals. Please review the following plan we discussed and let me know if I can assist you in the future.   These are the goals we discussed:  Goals   None     This is a list of the screening recommended for you and due dates:  Health Maintenance  Topic Date Due   Flu Shot  05/05/2022   Mammogram  07/01/2023   Tetanus Vaccine  08/03/2024   Colon Cancer Screening  04/22/2031   Pneumonia Vaccine  Completed   DEXA scan (bone density measurement)  Completed   COVID-19 Vaccine  Completed   Hepatitis C Screening: USPSTF Recommendation to screen - Ages 82-79 yo.  Completed   Zoster (Shingles) Vaccine  Completed   HPV Vaccine  Aged Out          Objective:    There were no vitals filed for this visit. There is no height or weight on file to calculate BMI.     02/11/2022   12:44 PM 07/23/2021    9:51 AM 05/22/2021    1:19 PM 04/21/2021    8:05 AM 04/17/2021   11:57 AM 04/02/2021   10:49 AM 03/19/2021    6:33 AM  Advanced Directives  Does Patient Have a Medical Advance Directive? _0  Yes Yes  Type of Corporate treasurer of [place];Living will [place];Living will Healthcare Power of [place];Living will [place];Living will [place];Living will  Does patient want to make changes to medical advance directive? No - Patient declined  No - Guardian declined  No - Patient declined  No - Patient declined  Copy of [place] in Chart?  Yes - validated most recent copy scanned in chart (See row information) No - copy requested  No - copy requested No - copy requested Yes -  validated most recent copy scanned in chart (See row information)    Current Medications (verified) Outpatient Encounter Medications as of 05/18/2022  Medication Sig   albuterol (PROVENTIL) (2.5 MG/3ML) 0.083% nebulizer solution Take 3 mLs (2.5 mg total) by nebulization every 4 (four) hours as needed for wheezing or shortness of breath.   albuterol (VENTOLIN HFA) 108 (90 Base) MCG/ACT inhaler Inhale 2 puffs into the lungs every 4 (four) hours as needed for wheezing.   ALPRAZolam (XANAX) 1 MG tablet Take 1 tablet (1 mg total) by mouth 2 (two) times daily as needed for anxiety. (Patient taking differently: Take 0.5-1 mg by mouth at bedtime.)   brimonidine (ALPHAGAN) 0.2 % ophthalmic solution Place 1 drop into both eyes 2 (two) times daily.   budesonide-formoterol (SYMBICORT) 160-4.5 MCG/ACT inhaler Inhale 2 puffs into the lungs as needed.   Cholecalciferol (VITAMIN D3) 50 MCG (2000 UT) TABS Take 2,000 Units by mouth daily.   cyclobenzaprine (FLEXERIL) 10 MG tablet TAKE ONE TABLET BY MOUTH DAILY  as needed muscle spasms (Patient taking differently: Take 10 mg by mouth daily as needed (Arthritis and torn rotator cuff left).)   dexlansoprazole (DEXILANT) 60 MG capsule Take 1 capsule (60 mg total) by mouth daily.   dicyclomine (BENTYL) 10 MG capsule Take one capsule before  meals and at bedtime AS NEEDED for abdominal pain and frequent stool. HOLD for constipation (Patient taking differently: Take 10 mg by mouth daily as needed (IBS).)   DUREZOL 0.05 % EMUL Place 1 drop into the right eye daily.   estradiol (ESTRACE VAGINAL) 0.1 MG/GM vaginal cream Place 1 Applicatorful vaginally daily as needed.   fenofibrate (TRICOR) 145 MG tablet Take 1 tablet (145 mg total) by mouth daily.   Hydrocod Polst-Chlorphen Polst (CHLORPHENIRAMINE-HYDROCODONE) 10-8 MG/5ML Take 5 mLs by mouth every 12 (twelve) hours as needed for cough.   HYDROcodone-acetaminophen (NORCO/VICODIN) 5-325 MG tablet Take 1 tablet by mouth every 6  (six) hours as needed for moderate pain.   hyoscyamine (ANASPAZ) 0.125 MG TBDP disintergrating tablet Place 1 tablet (0.125 mg total) under the tongue daily as needed (IBS). Place 0.125 mg under the tongue daily as needed (IBS).   irbesartan (AVAPRO) 150 MG tablet Take 0.5 tablets (75 mg total) by mouth daily.   latanoprost (XALATAN) 0.005 % ophthalmic solution Place 1 drop into both eyes at bedtime.   levothyroxine (SYNTHROID) 25 MCG tablet Take 1 tablet (25 mcg total) by mouth daily before breakfast.   Lidocaine-Hydrocortisone Ace 3-2.5 % KIT APPLY TO RECTUM QID for 2 weeks then AS NEEDED FOR RECTAL PAIN OR BLEEDING   Lifitegrast (XIIDRA) 5 % SOLN INSTILL 1 DROP IN BOTH EYES EVERY DAY AS NEEDED (EACH CONTAINER SHOULD YIELD 2 DROPS) STORE IN ORIGINAL FOIL POUCH (Patient taking differently: Place 1 drop into both eyes daily. (EACH CONTAINER SHOULD YIELD 2 DROPS) STORE IN ORIGINAL FOIL POUCH)   Magnesium 500 MG TABS Take 1 tablet by mouth daily.   meclizine (ANTIVERT) 12.5 MG tablet Take 1 tablet (12.5 mg total) by mouth 3 (three) times daily as needed for dizziness. (Patient taking differently: Take 12.5 mg by mouth 3 (three) times daily as needed (vertigo).)   Menthol, Topical Analgesic, (BIOFREEZE EX) Apply 1 application topically daily as needed (pain).   metroNIDAZOLE (METROCREAM) 0.75 % cream Apply 1 application. topically as needed.   naproxen (NAPROSYN) 500 MG tablet Take 1 tablet (500 mg total) by mouth daily as needed for moderate pain or mild pain.   neomycin-polymyxin b-dexamethasone (MAXITROL) 3.5-10000-0.1 OINT Place 1 application into both eyes at bedtime as needed. (Patient taking differently: Place 1 application  into both eyes at bedtime as needed (Dry eyes).)   nitroGLYCERIN (NITROLINGUAL) 0.4 MG/SPRAY spray Place 1 spray under the tongue every 5 (five) minutes as needed for chest pain.   NON FORMULARY Take 1 tablet by mouth daily as needed (Stomach Bloat). FD GUARD   ondansetron  (ZOFRAN) 4 MG tablet Take 1 tablet (4 mg total) by mouth every 8 (eight) hours as needed for nausea or vomiting.   prednisoLONE acetate (PRED FORTE) 1 % ophthalmic suspension Place 1 drop into the right eye 3 (three) times daily.   Probiotic Product (PROBIOTIC DAILY PO) Take 1 capsule by mouth daily. VSL #3   UNABLE TO FIND Place 1 application rectally daily as needed. [place] Name: lidocaine 5% pramoxine 1% ointment per rectum as needed.   zolpidem (AMBIEN) 10 MG tablet Take 1 tablet (10 mg total) by mouth at bedtime as needed for sleep.   No facility-administered encounter medications on file as of 05/18/2022.    Allergies (verified) Nurtec [rimegepant sulfate], Nutmeg oil (myristica oil), Adhesive [tape], Aspirin, Imitrex [sumatriptan], Keflex [cephalexin], Levaquin [levofloxacin in d5w], Other, Statins, Sulfa antibiotics, Sulfonamide derivatives, and Latex   History: Past Medical History:  Diagnosis Date   Anal fissure    Anxiety    Arthritis    Asthma    C. difficile diarrhea 09/17/2021   Cataract    Phreesia 01/05/2021   Chest pain    a. 02/2000 Cath: nl cors, EF 60%;  b. 10/2010 MV: nl LV, no ischemia/infarct;  c. 03/2014 Admit c/p, r/o->grief from husbands death.   Chronic RUQ pain 11-25-07   EUS slightly dilated CBD (7.52m), otherwise nl   Depression    Diverticulosis    Eczema    Exposure to chemical inhalation mid 1970s   Fatty liver    G6PD deficiency    Gallstones    GERD (gastroesophageal reflux disease)    Glaucoma    History of pulmonary embolus (PE)    Treated with Eliquis 2Feb 20, 2016  HTN (hypertension)    Hypercholesteremia    a. intolerant to statins.   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 She has limited vision in the right eye.   Migraines    inner ocular   Ocular migraine    Palpitations    Pleurisy    Pneumonia 07/16/2021   Patient was exposed to  a harsh chemical in the 1970's that created scar tissue in her lungs. She has had pneumonia several times in the past.   PONV (postoperative nausea and vomiting)    Pulmonary emboli (HCC)    Recurrent upper respiratory infection (URI)    Renal cyst    Retinal cyst    Sickle cell trait (HCC)    Tubular adenoma of colon 09/17/2011   Urticaria    Past Surgical History:  Procedure Laterality Date   ABDOMINAL HYSTERECTOMY     ADENOIDECTOMY     ANGIOPLASTY     APPENDECTOMY     BIOPSY N/A 03/14/2013   Procedure: SMALL BOWEL AND GASTRIC BIOPSIES (Procedure #1);  Surgeon: SDanie Binder MD;  Location: [facility];  Service: Endoscopy;  Laterality: N/A;   CARDIAC CATHETERIZATION Left 02/11/2000   CATARACT EXTRACTION Left    CATARACT EXTRACTION W/PHACO Right 03/19/2021   Procedure: CATARACT EXTRACTION PHACO AND INTRAOCULAR LENS PLACEMENT ([place] RIGHT 6.75 00:50.4;  Surgeon: BLeandrew Koyanagi MD;  [facility]  Service: Ophthalmology;  Laterality: Right;   CHOLECYSTECTOMY  12/2007   COLONOSCOPY  12/22/2010   RGUR:KYHCWC cecal adenomatous polyp   COLONOSCOPY WITH PROPOFOL N/A 03/31/2016   Dr. FOneida Alar four sessile polyps rectum/sigmoid colon, diverticulosi, ext/int hemorrhoids. hyperplastic polyps, next tcs 5 years.   COLONOSCOPY WITH PROPOFOL N/A 04/21/2021   anal fissure, non-bleeding internal hemorrhoids, right colon diverticulosis, one 2 mm polyp in ascending, three 4-6 mm polyps in descending colon. Tubular adenomas. 5 year surveillance.   complete hysterectomy     ENTEROSCOPY N/A 03/14/2013   SBJS:EGBTgastritis/ulcers has healed   ESOPHAGOGASTRODUODENOSCOPY  09/22/2011   SDVV:OHYWgastritis/Duodenitis   EXCISIONAL HEMORRHOIDECTOMY     FLEXIBLE SIGMOIDOSCOPY N/A 03/14/2013   SLF:3 colon polyp removed/moderate sized internal hemorrhoids   FLEXIBLE SIGMOIDOSCOPY N/A 06/09/2016   Procedure: FLEXIBLE SIGMOIDOSCOPY;  Surgeon: SDanie Binder MD;  [facility];   Service: Endoscopy;  Laterality: N/A;  rectal polyps times 2   FLEXIBLE SIGMOIDOSCOPY N/A 03/18/2018   Procedure: FLEXIBLE SIGMOIDOSCOPY;  Surgeon: FDanie Binder MD;  [facility];  Service: Endoscopy;  Laterality: N/A;  9:15am   FOOT SURGERY  bunion removal left   GIVENS CAPSULE STUDY N/A 02/10/2013   Procedure: GIVENS CAPSULE STUDY;  Surgeon: Danie Binder, MD;  [facility];  Service: Endoscopy;  Laterality: N/A;  [place]     right    HEMORRHOID BANDING N/A 03/14/2013   Procedure: HEMORRHOID BANDING (Procedure #3)  3 bands applied [id number] Exp 02/02/2014 ;  Surgeon: Danie Binder, MD;  Location: [facility];  Service: Endoscopy;  Laterality: N/A;   HEMORRHOID SURGERY N/A 04/24/2016   Procedure: EXTENSIVE HEMORRHOIDECTOMY;  Surgeon: Vickie Epley, MD;  Location: [facility];  Service: General;  Laterality: N/A;   LAPAROSCOPY     adhesions   POLYPECTOMY N/A 03/14/2013   KGU:RKYH Gastritis . ULCERS SEEN ON MAY 6 HAVE HEALED   POLYPECTOMY  03/31/2016   Procedure: POLYPECTOMY;  Surgeon: Danie Binder, MD;  [facility];  Service: Endoscopy;;  sigmoid colon polyps x2, rectal polyps x2   POLYPECTOMY  04/21/2021   Procedure: POLYPECTOMY;  Surgeon: Eloise Harman, DO;  [facility];  Service: Endoscopy;;   RECTAL EXAM UNDER ANESTHESIA N/A 07/23/2021   Procedure: RECTAL EXAM UNDER ANESTHESIA;  Surgeon: Leighton Ruff, MD;  Location: [facility];  Service: General;  Laterality: N/A;   SPHINCTEROTOMY N/A 07/23/2021   Procedure: CHEMICAL SPHINCTEROTOMY;  Surgeon: Leighton Ruff, MD;  Location: [place];  Service: General;  Laterality: N/A;   TONSILLECTOMY     TUBAL LIGATION     Family History  Problem Relation Age of Onset   Colon cancer Mother        52s   Urticaria Mother    Heart disease Mother    Colon polyps Mother    Cancer Father        oral   Crohn's disease Sister    Cancer  Sister        around heart   Colon polyps Sister    Diabetes Brother    Other Brother        intestinal sugery   Colon polyps Brother    Colon cancer Maternal Grandmother    Colon cancer Maternal Grandfather    Colon cancer Paternal Grandfather    Anesthesia problems Neg Hx    Social History   Socioeconomic History   Marital status: Widowed    Spouse name: Not on file   Number of children: 1   Years of education: Not on file   Highest education level: Not on file  Occupational History   Occupation: homemaker    Employer: UNEMPLOYED  Tobacco Use   Smoking status: Former    Packs/day: 0.50    Years: 30.00    Total pack years: 15.00    Types: Cigarettes    Quit date: 10/15/1996    Years since quitting: 25.6   Smokeless tobacco: Never   Tobacco comments:    Stopped smoking ~ 2000  Vaping Use   Vaping Use: Never used  Substance and Sexual Activity   Alcohol use: Yes    Alcohol/week: 4.0 standard drinks of alcohol    Types: 4 Glasses of wine per week    Comment: 1 drinks every couple of weeks   Drug use: No   Sexual activity: Yes    Birth control/protection: Surgical  Other Topics Concern   Not on file  Social History Narrative   Does not routinely exercise. Husband passed in JUN 2015 due to prostate ca.   Social Determinants of  Health   Financial Resource Strain: Low Risk  (04/02/2021)   Overall Financial Resource Strain (CARDIA)    Difficulty of Paying Living Expenses: Not hard at all  Food Insecurity: No Food Insecurity (05/22/2021)   Hunger Vital Sign    Worried About Running Out of Food in the Last Year: Never true    Ran Out of Food in the Last Year: Never true  Transportation Needs: Unmet Transportation Needs (05/01/2022)   PRAPARE - Transportation    Lack of Transportation (Medical): Yes    Lack of Transportation (Non-Medical): Yes  Physical Activity: Sufficiently Active (04/02/2021)   Exercise Vital Sign    Days of Exercise per Week: 7 days    Minutes of  Exercise per Session: 60 min  Stress: No Stress Concern Present (04/02/2021)   [place]    Feeling of Stress : Not at all  Social Connections: Socially Isolated (04/02/2021)   Social Connection and Isolation Panel [NHANES]    Frequency of Communication with Friends and Family: More than three times a week    Frequency of Social Gatherings with Friends and Family: Never    Attends Religious Services: Never    Marine scientist or Organizations: No    Attends Archivist Meetings: Never    Marital Status: Widowed    Tobacco Counseling Counseling given: Not Answered Tobacco comments: Stopped smoking ~ 2000   Clinical Intake:  Diabetic?no         Activities of Daily Living    07/23/2021   10:02 AM  In your present state of health, do you have any difficulty performing the following activities:  Hearing? 0  Vision? 1  Difficulty concentrating or making decisions? 0  Walking or climbing stairs? 0  Dressing or bathing? 0    Patient Care Team: Lindell Spar, MD as PCP - General (Internal Medicine) Satira Sark, MD as PCP - Cardiology (Cardiology) Danie Binder, MD (Inactive) (Gastroenterology)  Indicate any recent Medical Services you may have received from other than [facility] providers in the past year (date may be approximate).     Assessment:   This is a routine wellness examination for [place].  Hearing/Vision screen No results found.  Dietary issues and exercise activities discussed:     Goals Addressed   None   Depression Screen    05/14/2022    9:40 AM 05/07/2022    9:43 AM 01/20/2022    1:27 PM 11/17/2021    2:29 PM 09/17/2021    9:44 AM 05/22/2021    1:31 PM 05/16/2021   10:33 AM  PHQ 2/9 Scores  PHQ - 2 Score 0 0 0 0 0 0 1  PHQ- 9 Score       1    Fall Risk    05/14/2022    9:40 AM 05/07/2022    9:43 AM 01/20/2022    1:27 PM 11/17/2021    2:29 PM 09/17/2021    9:43  AM  Fall Risk   Falls in the past year? 0 0 0 0 1  Number falls in past yr: 0 0 0 0 0  Injury with Fall? 0 0 0 0 0  Risk for fall due to : No Fall Risks No Fall Risks No Fall Risks No Fall Risks History of fall(s)  Follow up _0 ;Falls prevention discussed    FALL RISK PREVENTION  PERTAINING TO THE HOME:  Any stairs in or around the home? Yes  If so, are there any without handrails? Yes  Home free of loose throw rugs in walkways, pet beds, electrical cords, etc? Yes  Adequate lighting in your home to reduce risk of falls? Yes   ASSISTIVE DEVICES UTILIZED TO PREVENT FALLS:  Life alert? Yes  Use of a cane, walker or w/c? No  Grab bars in the bathroom? Yes  Shower chair or bench in shower? Yes  Elevated toilet seat or a handicapped toilet? No           04/02/2021   10:50 AM  6CIT Screen  What Year? 0 points  What month? 0 points  What time? 0 points  Count back from 20 0 points  Months in reverse 0 points  Repeat phrase 0 points  Total Score 0 points    Immunizations Immunization History  Administered Date(s) Administered   Fluad Quad(high Dose 65+) 06/05/2019, 07/22/2020, 06/16/2021   Influenza Split 07/12/2012   Influenza,inj,Quad PF,6+ Mos 08/03/2014, 07/19/2015, 08/11/2016, 06/24/2017, 07/04/2018   Moderna Covid-19 Vaccine Bivalent Booster 75yr & up 01/31/2021, 06/30/2021, 01/30/2022   Moderna Sars-Covid-2 Vaccination 11/16/2019, 12/15/2019, 08/02/2020   Pneumococcal Conjugate-13 02/15/2018   Pneumococcal Polysaccharide-23 08/03/2014, 01/12/2020   Tdap 08/25/2012, 08/03/2014   Zoster Recombinat (Shingrix) 01/16/2020, 03/21/2020   Zoster, Live 08/25/2012    TDAP status: Up to date  Flu Vaccine status: Up to date  Pneumococcal vaccine status: Up to date  Covid-19 vaccine status: Completed vaccines  Qualifies for Shingles  Vaccine? Yes   Zostavax completed Yes   Shingrix Completed?: Yes  Screening Tests Health Maintenance  Topic Date Due   INFLUENZA VACCINE  05/05/2022   MAMMOGRAM  07/01/2023   TETANUS/TDAP  08/03/2024   COLONOSCOPY (Pts [phone] coverage will need to be confirmed)  04/22/2031   Pneumonia Vaccine 6530Years old  Completed   DEXA SCAN  Completed   COVID-19 Vaccine  Completed   Hepatitis C Screening  Completed   Zoster Vaccines- Shingrix  Completed   HPV VACCINES  Aged Out    Health Maintenance  Health Maintenance Due  Topic Date Due   INFLUENZA VACCINE  05/05/2022    Colorectal cancer screening: Type of screening: Colonoscopy. Completed 04/21/21. Repeat every 10 years  Mammogram status: Completed 06/30/21. Repeat every year  Bone Density status: Completed 05/18/18. Results reflect: Bone density results: NORMAL. Repeat every 2 years.  Lung Cancer Screening: (Low Dose CT Chest recommended if Age 69-80ears, 30 pack-year currently smoking OR have quit w/in 15years.) does not qualify.   Lung Cancer Screening Referral: no  Additional Screening:  Hepatitis C Screening: does qualify; Completed 05/07/17  Vision Screening: Recommended annual ophthalmology exams for early detection of glaucoma and other disorders of the eye. Is the patient up to date with their annual eye exam?  Yes  Who is the provider or what is the name of the office in which the patient attends annual eye exams? [facility]  If pt is not established with a provider, would they like to be referred to a provider to establish care? No .   Dental Screening: Recommended annual dental exams for proper oral hygiene  Community Resource Referral / Chronic Care Management: CRR required this visit?  No   CCM required this visit?  No      Plan:     I have personally reviewed and noted the following in the patient's chart:   Medical and social history  Use of alcohol, tobacco or illicit drugs  Current  medications and supplements including opioid prescriptions.  Functional ability and status Nutritional status Physical activity Advanced directives List of other physicians Hospitalizations, surgeries, and ER visits in previous 12 months Vitals Screenings to include cognitive, depression, and falls Referrals and appointments  In addition, I have reviewed and discussed with patient certain preventive protocols, quality metrics, and best practice recommendations. A written personalized care plan for preventive services as well as general preventive health recommendations were provided to patient.     Quentin Angst, [place]   05/18/2022

## 2022-05-21 ENCOUNTER — Ambulatory Visit (INDEPENDENT_AMBULATORY_CARE_PROVIDER_SITE_OTHER): Payer: Medicare Other

## 2022-05-21 DIAGNOSIS — R0602 Shortness of breath: Secondary | ICD-10-CM | POA: Diagnosis not present

## 2022-05-21 LAB — ECHOCARDIOGRAM COMPLETE
AR max vel: 2.79 cm2
AV Area VTI: 2.97 cm2
AV Area mean vel: 2.51 cm2
AV Mean grad: 2.9 mmHg
AV Peak grad: 4.4 mmHg
Ao pk vel: 1.05 m/s
Area-P 1/2: 2.84 cm2
Calc EF: 76.6 %
S' Lateral: 2.25 cm
Single Plane A2C EF: 80 %
Single Plane A4C EF: 72.8 %

## 2022-05-27 ENCOUNTER — Ambulatory Visit: Payer: Medicare Other | Admitting: Internal Medicine

## 2022-05-28 ENCOUNTER — Telehealth: Payer: Self-pay

## 2022-05-28 ENCOUNTER — Encounter: Payer: Medicare Other | Admitting: Physical Therapy

## 2022-05-28 NOTE — Telephone Encounter (Signed)
Patient received a letter and due for mammogram after 09.26.2023. Please call patient needs an order and needs to arrange transportation.  Patient call back # [phone]  Can make appointment 10.01.2023 or later afternoon appointment at [facility].

## 2022-05-29 ENCOUNTER — Other Ambulatory Visit: Payer: Self-pay | Admitting: *Deleted

## 2022-05-29 DIAGNOSIS — Z1231 Encounter for screening mammogram for malignant neoplasm of breast: Secondary | ICD-10-CM

## 2022-05-29 NOTE — Telephone Encounter (Signed)
scheduled

## 2022-05-29 NOTE — Telephone Encounter (Signed)
Mammogram ordered please schedule

## 2022-06-01 ENCOUNTER — Encounter: Payer: Self-pay | Admitting: Internal Medicine

## 2022-06-01 ENCOUNTER — Ambulatory Visit (INDEPENDENT_AMBULATORY_CARE_PROVIDER_SITE_OTHER): Payer: Medicare Other | Admitting: Internal Medicine

## 2022-06-01 VITALS — BP 120/72 | HR 88 | Ht 67.0 in | Wt 215.0 lb

## 2022-06-01 DIAGNOSIS — K760 Fatty (change of) liver, not elsewhere classified: Secondary | ICD-10-CM

## 2022-06-01 DIAGNOSIS — K219 Gastro-esophageal reflux disease without esophagitis: Secondary | ICD-10-CM

## 2022-06-01 DIAGNOSIS — R197 Diarrhea, unspecified: Secondary | ICD-10-CM | POA: Diagnosis not present

## 2022-06-01 DIAGNOSIS — R7989 Other specified abnormal findings of blood chemistry: Secondary | ICD-10-CM | POA: Diagnosis not present

## 2022-06-01 NOTE — Patient Instructions (Signed)
Due to recent changes in healthcare laws, you may see the results of your imaging and laboratory studies on MyChart before your provider has had a chance to review them.  We understand that in some cases there may be results that are confusing or concerning to you. Not all laboratory results come back in the same time frame and the provider may be waiting for multiple results in order to interpret others.  Please give [place] 48 hours in order for your provider to thoroughly review all the results before contacting the office for clarification of your results.   You have been scheduled for an abdominal ultrasound at [facility] (1st floor of hospital) on 06/09/2022 at 10:30am. Please arrive 15 minutes prior to your appointment for registration. Make certain not to have anything to eat or drink 6 hours prior to your appointment. Should you need to reschedule your appointment, please contact radiology at [phone]. This test typically takes about 30 minutes to perform.   I appreciate the opportunity to care for you. Georgian Co, MD

## 2022-06-01 NOTE — Progress Notes (Signed)
Chief Complaint: Diarrhea, fecal incontinence  HPI : 71 year old female with history of colon polyps, asthma, PE, hypothyroidism, IBS, hemorrhoids s/p hemorrhoidectomy in 2017, prior anal fissure s/p sphincterotomy in 07/2021 presents for follow of up of diarrhea and fecal incontinence.  Interval History: She had pelvic floor PT, which was a really positive experience. She is now much more in tune with how stool moves through her colon and has strengthened her pelvic floor muscles. She is not having as many leakage issues anymore. She has also had good luck with the low FODMAP diet. She recently saw her endocrinologist that recommended a diet that is somewhat contradictory to the low FODMAP diet. She thinks that the low FODMAP diet seems much more tolerable at this time. Currently her diarrhea is under better control. She is having on average one BM per day. She is taking Dexilant PRN to help with her reflux symptoms.  Wt Readings from Last 3 Encounters:  06/01/22 215 lb (97.5 kg)  05/18/22 211 lb 1.9 oz (95.8 kg)  05/14/22 210 lb 6.4 oz (95.4 kg)   Current Outpatient Medications  Medication Sig Dispense Refill   albuterol (PROVENTIL) (2.5 MG/3ML) 0.083% nebulizer solution Take 3 mLs (2.5 mg total) by nebulization every 4 (four) hours as needed for wheezing or shortness of breath. 150 mL 3   albuterol (VENTOLIN HFA) 108 (90 Base) MCG/ACT inhaler Inhale 2 puffs into the lungs every 4 (four) hours as needed for wheezing. 54 g 3   ALPRAZolam (XANAX) 1 MG tablet Take 1 tablet (1 mg total) by mouth 2 (two) times daily as needed for anxiety. (Patient taking differently: Take 0.5-1 mg by mouth at bedtime.) 180 tablet 3   brimonidine (ALPHAGAN) 0.2 % ophthalmic solution Place 1 drop into both eyes 2 (two) times daily. 15 mL 3   budesonide-formoterol (SYMBICORT) 160-4.5 MCG/ACT inhaler Inhale 2 puffs into the lungs as needed. 10.2 g 3   Cholecalciferol (VITAMIN D3) 50 MCG (2000 UT) TABS Take 2,000 Units  by mouth daily.     cyclobenzaprine (FLEXERIL) 10 MG tablet TAKE ONE TABLET BY MOUTH DAILY  as needed muscle spasms (Patient taking differently: Take 10 mg by mouth daily as needed (Arthritis and torn rotator cuff left).) 90 tablet 3   dexlansoprazole (DEXILANT) 60 MG capsule Take 1 capsule (60 mg total) by mouth daily. 90 capsule 3   dicyclomine (BENTYL) 10 MG capsule Take one capsule before meals and at bedtime AS NEEDED for abdominal pain and frequent stool. HOLD for constipation (Patient taking differently: Take 10 mg by mouth daily as needed (IBS).) 120 capsule 1   DUREZOL 0.05 % EMUL Place 1 drop into the right eye daily.     estradiol (ESTRACE VAGINAL) 0.1 MG/GM vaginal cream Place 1 Applicatorful vaginally daily as needed. 42.5 g 3   fenofibrate (TRICOR) 145 MG tablet Take 1 tablet (145 mg total) by mouth daily. 90 tablet 3   Hydrocod Polst-Chlorphen Polst (CHLORPHENIRAMINE-HYDROCODONE) 10-8 MG/5ML Take 5 mLs by mouth every 12 (twelve) hours as needed for cough. 115 mL 0   HYDROcodone-acetaminophen (NORCO/VICODIN) 5-325 MG tablet Take 1 tablet by mouth every 6 (six) hours as needed for moderate pain. 30 tablet 0   hyoscyamine (ANASPAZ) 0.125 MG TBDP disintergrating tablet Place 1 tablet (0.125 mg total) under the tongue daily as needed (IBS). Place 0.125 mg under the tongue daily as needed (IBS). 30 tablet 0   irbesartan (AVAPRO) 150 MG tablet Take 0.5 tablets (75 mg total) by mouth  daily. 45 tablet 3   latanoprost (XALATAN) 0.005 % ophthalmic solution Place 1 drop into both eyes at bedtime. 9 mL 3   levothyroxine (SYNTHROID) 25 MCG tablet Take 1 tablet (25 mcg total) by mouth daily before breakfast. 95 tablet 1   Lidocaine-Hydrocortisone Ace 3-2.5 % KIT APPLY TO RECTUM QID for 2 weeks then AS NEEDED FOR RECTAL PAIN OR BLEEDING 1 kit 11   Lifitegrast (XIIDRA) 5 % SOLN INSTILL 1 DROP IN BOTH EYES EVERY DAY AS NEEDED (EACH CONTAINER SHOULD YIELD 2 DROPS) STORE IN ORIGINAL FOIL POUCH (Patient  taking differently: Place 1 drop into both eyes daily. (EACH CONTAINER SHOULD YIELD 2 DROPS) STORE IN ORIGINAL FOIL POUCH) 60 each 3   Magnesium 500 MG TABS Take 1 tablet by mouth daily.     meclizine (ANTIVERT) 12.5 MG tablet Take 1 tablet (12.5 mg total) by mouth 3 (three) times daily as needed for dizziness. (Patient taking differently: Take 12.5 mg by mouth 3 (three) times daily as needed (vertigo).) 30 tablet 3   Menthol, Topical Analgesic, (BIOFREEZE EX) Apply 1 application topically daily as needed (pain).     metroNIDAZOLE (METROCREAM) 0.75 % cream Apply 1 application. topically as needed. 45 g 3   naproxen (NAPROSYN) 500 MG tablet Take 1 tablet (500 mg total) by mouth daily as needed for moderate pain or mild pain. 30 tablet 0   neomycin-polymyxin b-dexamethasone (MAXITROL) 3.5-10000-0.1 OINT Place 1 application into both eyes at bedtime as needed. (Patient taking differently: Place 1 application  into both eyes at bedtime as needed (Dry eyes).) 10.5 g 3   nitroGLYCERIN (NITROLINGUAL) 0.4 MG/SPRAY spray Place 1 spray under the tongue every 5 (five) minutes as needed for chest pain. 12 g 3   NON FORMULARY Take 1 tablet by mouth daily as needed (Stomach Bloat). FD GUARD     ondansetron (ZOFRAN) 4 MG tablet Take 1 tablet (4 mg total) by mouth every 8 (eight) hours as needed for nausea or vomiting. 30 tablet 1   Probiotic Product (PROBIOTIC DAILY PO) Take 1 capsule by mouth daily. VSL #3     UNABLE TO FIND Place 1 application rectally daily as needed. [place] Name: lidocaine 5% pramoxine 1% ointment per rectum as needed.     zolpidem (AMBIEN) 10 MG tablet Take 1 tablet (10 mg total) by mouth at bedtime as needed for sleep. 90 tablet 1   prednisoLONE acetate (PRED FORTE) 1 % ophthalmic suspension Place 1 drop into the right eye 3 (three) times daily. (Patient not taking: Reported on 06/01/2022)     No current facility-administered medications for this visit.   Review of  Systems: All systems reviewed and negative except where noted in HPI.   Physical Exam: Ht _0  (1.702 m)   Wt 215 lb (97.5 kg)   BMI 33.67 kg/m  Constitutional: Pleasant,well-developed, female in no acute distress. HEENT: Normocephalic and atraumatic. Conjunctivae are normal. No scleral icterus. Cardiovascular: Normal rate, regular rhythm.  Pulmonary/chest: Effort normal and breath sounds normal. No wheezing, rales or rhonchi. Abdominal: Soft, nondistended, nontender. Bowel sounds active throughout. There are no masses palpable. No hepatomegaly. Extremities: No edema Neurological: Alert and oriented to person place and time. Skin: Skin is warm and dry. No rashes noted. Psychiatric: Normal mood and affect. Behavior is normal.  Labs 07/2012: TTG IGA negative.  Labs 09/2021: C dif toxin positive on GI profile. TSH nml. CMP with mildly elevated Cr of 1.17.   Labs 02/2022: GI profile positive  for norovirus. C dif negative. O&P negative.  Labs 04/2022: CMP with elevated Cr of 1.1, elevated AST of 65, elevated T bili of 1.4. TSH and FT4 nml.   CT A/P w/contrast 12/30/20: IMPRESSION: 2 cm benign cyst in lower pole of right kidney, corresponding to the lesion seen on recent MRI. No evidence of renal neoplasm, urolithiasis, or hydronephrosis.  Moderate hepatic steatosis.  Aortic Atherosclerosis (ICD10-I70.0).  SBE 03/14/13: Mild gastritis.  Flex sig 03/18/18: - Anal fissure found on perianal exam. - Hemorrhoids found on perianal exam. - The examination was otherwise normal.  Colonoscopy 04/21/21: - Anal fissure found on perianal exam. - Non-bleeding internal hemorrhoids. - Diverticulosis in the right colon. - One 2 mm polyp in the ascending colon, removed with a cold biopsy forceps. Resected and retrieved. - Three 4 to 6 mm polyps in the descending colon, removed with a cold snare. Resected and retrieved. Path: A. COLON, ASCENDING, POLYPECTOMY:  - Tubular adenoma.  - No high-grade  dysplasia or carcinoma.  B. COLON, DESCENDING, POLYPECTOMY:  - Tubular adenoma (1).  - No high-grade dysplasia or carcinoma.  - Hyperplastic polyp (1).  EGD 02/25/22: - Normal esophagus. Biopsied. - Gastritis. Biopsied. - Two duodenal polyps. Resected and retrieved. Path: 1. Surgical [P], small bowel polyps - DUODENAL MUCOSA WITH PEPTIC INJURY AND BRUNNER GLAND HYPERPLASIA. - NO DYSPLASIA OR MALIGNANCY. 2. Surgical [P], gastric - ANTRAL AND OXYNTIC MUCOSA WITH HYPEREMIA. - NO HELICOBACTER PYLORI IDENTIFIED. 3. Surgical [P], esophageal - UNREMARKABLE SQUAMOUS MUCOSA. - NO EOSINOPHILIC ESOPHAGITIS.  ASSESSMENT AND PLAN: Diarrhea, resolved GERD History of C dif  Hepatic steatosis Elevated LFTs History of cholecystectomy Patient has had significant improvement in her diarrhea and incontinence with pelvic floor PT and the low FODMAP diet. Will continue her current regimen at this time. Since patient has had elevated LFTs and fatty liver seen on imaging in the past, will go ahead and evaluate her liver fibrosis with elastography. Patient is taking Dexilant as needed since she has she is worried about developing recurrent C. difficile. - Low FODMAP diet - Continue daily fiber supplement - Will message her endocrinologist to let him know about the contradictory diet instructions and that perhaps we could focus on the low FODMAP diet for now - Continue Dexilant - U/S Elastography  - Completed pelvic floor PT - Repeat colonoscopy for polyp surveillance due in 04/2026 - RTC 6 months  [name], MD  I spent 40 minutes of time, including in depth chart review, independent review of results as outlined above, communicating results with the patient directly, face-to-face time with the patient, coordinating care, ordering studies and medications as appropriate, and documentation.

## 2022-06-09 ENCOUNTER — Ambulatory Visit (HOSPITAL_COMMUNITY)
Admission: RE | Admit: 2022-06-09 | Discharge: 2022-06-09 | Disposition: A | Discharge status: Home / Self Care | Payer: Medicare Other | Source: Ambulatory Visit | Attending: Internal Medicine | Admitting: Internal Medicine

## 2022-06-09 ENCOUNTER — Other Ambulatory Visit: Payer: Self-pay | Admitting: Internal Medicine

## 2022-06-09 ENCOUNTER — Telehealth: Payer: Self-pay | Admitting: Neurology

## 2022-06-09 ENCOUNTER — Telehealth: Payer: Self-pay | Admitting: Internal Medicine

## 2022-06-09 DIAGNOSIS — R7989 Other specified abnormal findings of blood chemistry: Secondary | ICD-10-CM

## 2022-06-09 DIAGNOSIS — K760 Fatty (change of) liver, not elsewhere classified: Secondary | ICD-10-CM | POA: Insufficient documentation

## 2022-06-09 NOTE — Telephone Encounter (Signed)
Spoke with the u/s tech at the hospital.

## 2022-06-09 NOTE — Telephone Encounter (Signed)
Pt would like to speak with someone about getting transportation for appt on 06/24/22.

## 2022-06-09 NOTE — Telephone Encounter (Signed)
I have reached out to the necessary sources on this to get clarity on who is used by our office to schedule transport.

## 2022-06-09 NOTE — Telephone Encounter (Signed)
Inbound call from patient requesting a call back in regards to orders . Please give a call to further advise

## 2022-06-09 NOTE — Telephone Encounter (Signed)
Order for the u/s with elastography corrected and signed.

## 2022-06-10 ENCOUNTER — Encounter: Payer: Medicare Other | Admitting: Physical Therapy

## 2022-06-10 ENCOUNTER — Telehealth: Payer: Self-pay | Admitting: Orthopaedic Surgery

## 2022-06-10 NOTE — Telephone Encounter (Signed)
Patient requests [name] to arrange transportation to appointment 06/18/22, 9:20am, w/Dr Luna Glasgow, through [employer].

## 2022-06-10 NOTE — Telephone Encounter (Signed)
Transportation scheduled via [employer] by [place].

## 2022-06-11 NOTE — Telephone Encounter (Signed)
Email sent to [employer].

## 2022-06-12 ENCOUNTER — Other Ambulatory Visit: Payer: Self-pay

## 2022-06-12 DIAGNOSIS — K7581 Nonalcoholic steatohepatitis (NASH): Secondary | ICD-10-CM

## 2022-06-12 DIAGNOSIS — R935 Abnormal findings on diagnostic imaging of other abdominal regions, including retroperitoneum: Secondary | ICD-10-CM

## 2022-06-18 ENCOUNTER — Ambulatory Visit (INDEPENDENT_AMBULATORY_CARE_PROVIDER_SITE_OTHER): Payer: Medicare Other | Admitting: Orthopaedic Surgery

## 2022-06-18 ENCOUNTER — Encounter: Payer: Self-pay | Admitting: Orthopaedic Surgery

## 2022-06-18 VITALS — Ht 67.0 in | Wt 210.0 lb

## 2022-06-18 DIAGNOSIS — M25511 Pain in right shoulder: Secondary | ICD-10-CM | POA: Diagnosis not present

## 2022-06-18 DIAGNOSIS — G8929 Other chronic pain: Secondary | ICD-10-CM | POA: Diagnosis not present

## 2022-06-18 DIAGNOSIS — M25512 Pain in left shoulder: Secondary | ICD-10-CM | POA: Diagnosis not present

## 2022-06-18 MED ORDER — METHYLPREDNISOLONE ACETATE 40 MG/ML IJ SUSP
40.0000 mg | Freq: Once | INTRAMUSCULAR | Status: AC
  Administered 2022-06-18: 40 mg via INTRA_ARTICULAR

## 2022-06-18 NOTE — Addendum Note (Signed)
Addended by: Obie Dredge A on: 06/18/2022 02:09 PM   Modules accepted: Orders

## 2022-06-18 NOTE — Progress Notes (Signed)
PROCEDURE NOTE:  The patient request injection, verbal consent was obtained.  The left shoulder was prepped appropriately after time out was performed.   Sterile technique was observed and injection of 1 cc of DepoMedrol 92m with several cc's of plain xylocaine. Anesthesia was provided by ethyl chloride and a 20-gauge needle was used to inject the shoulder area. A posterior approach was used.  The injection was tolerated well.  A band aid dressing was applied.  The patient was advised to apply ice later today and tomorrow to the injection sight as needed.  PROCEDURE NOTE:  The patient request injection, verbal consent was obtained.  The right shoulder was prepped appropriately after time out was performed.   Sterile technique was observed and injection of 1 cc of DepoMedrol 411mwith several cc's of plain xylocaine. Anesthesia was provided by ethyl chloride and a 20-gauge needle was used to inject the shoulder area. A posterior approach was used.  The injection was tolerated well.  A band aid dressing was applied.  The patient was advised to apply ice later today and tomorrow to the injection sight as needed.  Encounter Diagnoses  Name Primary?   Chronic pain in right shoulder Yes   Chronic pain in left shoulder    Begin OT for the shoulders.  Return in six weeks.  Call if any problem.  Precautions discussed.  Electronically Signed WaSanjuana KavaMD 9/14/20239:34 AM

## 2022-06-24 ENCOUNTER — Other Ambulatory Visit: Payer: Self-pay | Admitting: Neurology

## 2022-06-24 ENCOUNTER — Ambulatory Visit (INDEPENDENT_AMBULATORY_CARE_PROVIDER_SITE_OTHER): Payer: Medicare Other | Admitting: Neurology

## 2022-06-24 ENCOUNTER — Encounter: Payer: Self-pay | Admitting: Neurology

## 2022-06-24 VITALS — BP 128/78 | HR 74 | Ht 67.0 in | Wt 210.0 lb

## 2022-06-24 DIAGNOSIS — G43709 Chronic migraine without aura, not intractable, without status migrainosus: Secondary | ICD-10-CM | POA: Diagnosis not present

## 2022-06-24 DIAGNOSIS — M25511 Pain in right shoulder: Secondary | ICD-10-CM | POA: Diagnosis not present

## 2022-06-24 DIAGNOSIS — G8929 Other chronic pain: Secondary | ICD-10-CM | POA: Diagnosis not present

## 2022-06-24 DIAGNOSIS — M545 Low back pain, unspecified: Secondary | ICD-10-CM | POA: Diagnosis not present

## 2022-06-24 MED ORDER — RIZATRIPTAN BENZOATE 10 MG PO TBDP: 10.0000 mg | ORAL_TABLET | ORAL | 0 refills | Status: DC | PRN

## 2022-06-24 NOTE — Patient Instructions (Addendum)
Please contact the office and update [place] if you are taking the Nurtec.  In case you are not taking the Nurtec, please start it; 1 tablet every other day.  You can take a additional tablet when you have severe headache pain. In the case you are taking Nurtec then we will likely try her on a second preventive medication, can try Aimovig. We will give a trial of rizatriptan as needed for severe headaches Follow-up in 3 months or sooner if worse.
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
## 2022-06-25 ENCOUNTER — Telehealth: Payer: Self-pay | Admitting: Neurology

## 2022-06-25 NOTE — Telephone Encounter (Signed)
Yesterday she told me her headaches did not improved, no changes whatsoever, and now she is saying the Nurtec is helping her. Please clarify with the frequency of her headaches.

## 2022-06-25 NOTE — Telephone Encounter (Signed)
Pt said is taking Nurtec, [facility] need a new prescription for Nurtec and need to enter on the prescription is not allergy to the medication. Would like a call from the nurse to discuss.

## 2022-06-25 NOTE — Telephone Encounter (Signed)
I spoke with the patient. Nurtec had been added to her allergy list previously by another physician. She stated she had first taken Nurtec disintegrating tablet with cranberry juice. She stated the disintegrating sensation combined with cranberry juice resulted in an abnormal sensation in her throat.  She confirmed she has since been using the medication every other day with no reaction. She states the medication is working well for headache reduction.

## 2022-06-29 NOTE — Telephone Encounter (Signed)
I left a VM for return call.

## 2022-06-29 NOTE — Telephone Encounter (Signed)
I spoke with the patient. She stated she initially took Nurtec on 12/17/2021. She thought she had an allergic reaction at that time. She took the initial dose of Nurtec that she claims was helpful for an acute headache on 06/25/2022. She took her 3rd dose of Nurtec this morning (06/29/2022). Her headache frequency is 3 times per week. She stated she was confused during her appointment with Dr. April Manson due to a severe headache. She has tolerated the medication well without side effect during the past three doses.

## 2022-06-30 DIAGNOSIS — H401112 Primary open-angle glaucoma, right eye, moderate stage: Secondary | ICD-10-CM | POA: Diagnosis not present

## 2022-06-30 DIAGNOSIS — Z23 Encounter for immunization: Secondary | ICD-10-CM | POA: Diagnosis not present

## 2022-07-01 ENCOUNTER — Ambulatory Visit: Payer: Medicare Other

## 2022-07-06 ENCOUNTER — Ambulatory Visit (HOSPITAL_COMMUNITY)
Admission: RE | Admit: 2022-07-06 | Discharge: 2022-07-06 | Disposition: A | Discharge status: Home / Self Care | Payer: Medicare Other | Source: Ambulatory Visit | Attending: Internal Medicine | Admitting: Internal Medicine

## 2022-07-06 DIAGNOSIS — Z1231 Encounter for screening mammogram for malignant neoplasm of breast: Secondary | ICD-10-CM | POA: Diagnosis not present

## 2022-07-27 ENCOUNTER — Telehealth: Payer: Self-pay | Admitting: Radiology

## 2022-07-27 NOTE — Telephone Encounter (Signed)
Patient called about not having had PT yet.  She has a 6 week f/u with Dr Luna Glasgow this week.  She had spoken with PT and [place]in who said their office cannot offer transportation services.  I had sent the [place] to them via referral mid Sept.  I will call [facility] with [employer] and inquire further, patient has been talking to him.  Will f/u with patient at her appt this week.  Patient has been doing a HEP to her tolerance, in the interim.

## 2022-07-30 ENCOUNTER — Encounter: Payer: Self-pay | Admitting: Orthopaedic Surgery

## 2022-07-30 ENCOUNTER — Ambulatory Visit (INDEPENDENT_AMBULATORY_CARE_PROVIDER_SITE_OTHER): Payer: Medicare Other | Admitting: Orthopaedic Surgery

## 2022-07-30 VITALS — BP 104/66 | HR 93 | Ht 67.0 in | Wt 218.0 lb

## 2022-07-30 DIAGNOSIS — M25511 Pain in right shoulder: Secondary | ICD-10-CM | POA: Diagnosis not present

## 2022-07-30 DIAGNOSIS — M25512 Pain in left shoulder: Secondary | ICD-10-CM | POA: Diagnosis not present

## 2022-07-30 DIAGNOSIS — G8929 Other chronic pain: Secondary | ICD-10-CM | POA: Diagnosis not present

## 2022-07-30 NOTE — Patient Instructions (Addendum)
 As the weather changes and gets cooler, you may notice you are affected more. You may have more pain in your joints. This is normal. Dress warmly and make sure that area is covered well.   We have put in a referral for therapy again. We will work on getting the transportation that you need.   My name is  and if you need anything or have questions,please call or send a message via mychart.

## 2022-07-30 NOTE — Progress Notes (Addendum)
I have had transportation issues.  She is unable to get to OT because of transportation issues.  [name] is working on this.  The patient needs OT.    Both shoulders are tender with decreased motion.  NV intact.  ROM neck full.  Grips normal.  Encounter Diagnoses  Name Primary?   Chronic pain in right shoulder Yes   Chronic pain in left shoulder    We need to resolve transportation issues.  She needs treatment.  Return in one month.  Call if any problem.  Precautions discussed.  Electronically Signed Sanjuana Kava, MD 10/26/20239:44 AM  Home health eval and treat needed secondary to osteoarthritis resulting in need for Physical Therapy related to difficulties with Upper and Lower extremity functional use.   Electronically Signed Sanjuana Kava, MD 11/22/202311:10 AM

## 2022-08-13 ENCOUNTER — Telehealth: Payer: Self-pay | Admitting: Orthopaedic Surgery

## 2022-08-13 NOTE — Telephone Encounter (Signed)
I spoke with patient, she has concerns.  She called [facility], s/w driver.  Asked if they were non smoking, he told her no, that he would be smoking, and did smoke in the cab. I advised patient to call Yolanda at PT and discuss.

## 2022-08-13 NOTE — Telephone Encounter (Signed)
Patient called asking for [name] regarding transportation.  She did state that she has asthma and needs transportation that does not allow smoking inside the vehicle.  Patient's # [phone]

## 2022-08-19 ENCOUNTER — Ambulatory Visit (HOSPITAL_COMMUNITY): Payer: Medicare Other | Admitting: Occupational Therapy

## 2022-08-20 ENCOUNTER — Telehealth: Payer: Self-pay | Admitting: Radiology

## 2022-08-20 NOTE — Telephone Encounter (Signed)
I have been speaking with [name] at PT and with folks from [employer] about this patient.  PT has said they feel more comfortable with her starting with a HEP via HHPT.  Then progress to OP PT if needed.  Patient is open to this and wants a HHPT referral.  She is legally blind, so should qualify for HHPT services.  Please enter if ok with Dr Luna Glasgow?  Thanks.

## 2022-08-21 ENCOUNTER — Other Ambulatory Visit: Payer: Self-pay

## 2022-08-21 ENCOUNTER — Encounter: Payer: Self-pay | Admitting: Family Medicine

## 2022-08-21 ENCOUNTER — Ambulatory Visit (INDEPENDENT_AMBULATORY_CARE_PROVIDER_SITE_OTHER): Payer: Medicare Other | Admitting: Family Medicine

## 2022-08-21 VITALS — BP 130/79 | HR 91 | Temp 98.4°F | Ht 67.0 in | Wt 220.0 lb

## 2022-08-21 DIAGNOSIS — M542 Cervicalgia: Secondary | ICD-10-CM

## 2022-08-21 DIAGNOSIS — G8929 Other chronic pain: Secondary | ICD-10-CM

## 2022-08-21 DIAGNOSIS — M62838 Other muscle spasm: Secondary | ICD-10-CM | POA: Diagnosis not present

## 2022-08-21 DIAGNOSIS — I10 Essential (primary) hypertension: Secondary | ICD-10-CM | POA: Diagnosis not present

## 2022-08-21 DIAGNOSIS — G4486 Cervicogenic headache: Secondary | ICD-10-CM | POA: Diagnosis not present

## 2022-08-21 MED ORDER — CYCLOBENZAPRINE HCL 10 MG PO TABS: ORAL_TABLET | ORAL | 0 refills | Status: DC

## 2022-08-21 MED ORDER — HYDROCODONE-ACETAMINOPHEN 5-325MG PREPACK (~~LOC~~: ORAL_TABLET | ORAL | 0 refills | Status: DC

## 2022-08-21 MED ORDER — PREDNISONE 20 MG PO TABS: 20.0000 mg | ORAL_TABLET | Freq: Every day | ORAL | 0 refills | Status: DC

## 2022-08-21 MED ORDER — HYDROCODONE-ACETAMINOPHEN 5-325 MG PO TABS: ORAL_TABLET | ORAL | 0 refills | Status: DC

## 2022-08-21 NOTE — Patient Instructions (Addendum)
F/u with Dr Posey Pronto as before, call if you need to be seen sooner  Prednisone for 5 days, is prescribed for pain. Do NOT take naproxen or ibuprofen or aleve when taking this    Cyclobenzaprine a muscle relaxant is prescribe d at bedtime for a limited time only for neck spasm  Limited number of hydrocodone tabs are prescribed, you may take only  one hydrocodone once daily for pain relief if prednisone prescribed does not treat your pain  Thanks for choosing [facility], we consider it a privelige to serve you.

## 2022-08-23 ENCOUNTER — Encounter: Payer: Self-pay | Admitting: Family Medicine

## 2022-08-23 DIAGNOSIS — R519 Headache, unspecified: Secondary | ICD-10-CM | POA: Insufficient documentation

## 2022-08-23 DIAGNOSIS — M542 Cervicalgia: Secondary | ICD-10-CM | POA: Insufficient documentation

## 2022-08-23 DIAGNOSIS — M62838 Other muscle spasm: Secondary | ICD-10-CM | POA: Insufficient documentation

## 2022-08-23 NOTE — Assessment & Plan Note (Signed)
Acute left sided headache associated with left trapezius spasm, no neurologic deficits on exam, short sahrp course of prednisone prescribed

## 2022-08-23 NOTE — Progress Notes (Signed)
   CAMIRA GEIDEL     MRN: [id number]      DOB: May 15, 1951   HPI Ms. Leuthold is here with a 1 week h/o left sided neck pain and left headache, has difficulty turning her neck, stiff, no inciting trauma to trigger the event  ROS Denies recent fever or chills. Denies sinus pressure, nasal congestion, ear pain or sore throat. Denies chest congestion, productive cough or wheezing. Denies chest pains, palpitations and leg swelling Denies abdominal pain, nausea, vomiting,diarrhea or constipation.       PE  BP 130/79 (BP Location: Right Arm, Patient Position: Sitting, Cuff Size: Large)   Pulse 91   Temp 98.4 F (36.9 C)   Ht 5' 7"  (1.702 m)   Wt 220 lb (99.8 kg)   SpO2 92%   BMI 34.46 kg/m   Patient alert and oriented and in no cardiopulmonary distress.Uncomfortable, in pain, difficulty turning head  HEENT: No facial asymmetry, EOMI,     Neck decreased ROM with left trapezius spasm .  Chest: Clear to auscultation bilaterally.  CVS: S1, S2 no murmurs, no S3.Regular rate.  .   Ext: No edema  Skin: Intact, no ulcerations or rash noted.  Psych: Good eye contact, normal affect. Memory intact mildly  anxious not depressed appearing.  CNS: CN 2-12 intact, power,  normal throughout.no focal deficits noted.   Assessment & Plan  Neck muscle spasm Short course of bedtime flexeril prescribed and short course of prednisone  Neck pain Short course predniosne prescribed advised against NSAID use with this Limited hydrocodone prescribed short term if pain not adequately controlled  Headache Acute left sided headache associated with left trapezius spasm, no neurologic deficits on exam, short sahrp course of prednisone prescribed  Essential hypertension, benign Controlled, no change in medication

## 2022-08-23 NOTE — Assessment & Plan Note (Signed)
Short course of bedtime flexeril prescribed and short course of prednisone

## 2022-08-23 NOTE — Assessment & Plan Note (Addendum)
Short course predniosne prescribed advised against NSAID use with this Limited hydrocodone prescribed short term if pain not adequately controlled

## 2022-08-23 NOTE — Assessment & Plan Note (Signed)
Controlled, no change in medication  

## 2022-08-24 ENCOUNTER — Telehealth: Payer: Self-pay | Admitting: Internal Medicine

## 2022-08-24 NOTE — Telephone Encounter (Signed)
Patient called was told to take 26m prednisoLONE but her pharmacy has 10 mg. What does patient need to take. Please contact patient [phone]

## 2022-08-25 ENCOUNTER — Telehealth: Payer: Self-pay

## 2022-08-25 NOTE — Telephone Encounter (Signed)
Patient aware to take meds once a day for 5 days.

## 2022-09-01 ENCOUNTER — Ambulatory Visit: Payer: Medicare Other | Admitting: Orthopaedic Surgery

## 2022-09-02 ENCOUNTER — Encounter: Payer: Self-pay | Admitting: Internal Medicine

## 2022-09-02 ENCOUNTER — Ambulatory Visit (INDEPENDENT_AMBULATORY_CARE_PROVIDER_SITE_OTHER): Payer: Medicare Other | Admitting: Internal Medicine

## 2022-09-02 VITALS — BP 127/77 | HR 106 | Ht 67.0 in | Wt 219.8 lb

## 2022-09-02 DIAGNOSIS — M5137 Other intervertebral disc degeneration, lumbosacral region: Secondary | ICD-10-CM | POA: Diagnosis not present

## 2022-09-02 DIAGNOSIS — M5030 Other cervical disc degeneration, unspecified cervical region: Secondary | ICD-10-CM

## 2022-09-02 MED ORDER — PREDNISONE 10 MG (21) PO TBPK: ORAL_TABLET | ORAL | 0 refills | Status: DC

## 2022-09-02 MED ORDER — BACLOFEN 10 MG PO TABS: 10.0000 mg | ORAL_TABLET | Freq: Two times a day (BID) | ORAL | 0 refills | Status: DC

## 2022-09-02 NOTE — Assessment & Plan Note (Signed)
Previous x-ray of her cervical spine showed DDD of cervical spine with narrowing of disc space Old MRI of cervical spine showed disc disease and minimal bone spur She currently has constant neck pain and stiffness with mild radiation of the pain, could be cervical spinal stenosis as well Check MRI of cervical spine Changed from Flexeril to baclofen for neck stiffness Sterapred taper
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
## 2022-09-02 NOTE — Patient Instructions (Signed)
Please take Prednisone as prescribed.  Please take Baclofen for muscle stiffness. Do not take Flexeril for now.

## 2022-09-02 NOTE — Assessment & Plan Note (Signed)
Avoid heavy lifting and frequent bending Advised to use back brace and/or heating pad as needed Norco as needed for severe pain

## 2022-09-03 ENCOUNTER — Telehealth: Payer: Self-pay | Admitting: Internal Medicine

## 2022-09-03 NOTE — Telephone Encounter (Signed)
FYI, Dr Luna Glasgow scheduled patient with [facility] health and did contact patient and patient will have home health start Friday, 12/01/.2023

## 2022-09-03 NOTE — Telephone Encounter (Signed)
Patient called does Dr Posey Pronto need to see her before or after her MRI on the 12/19?  Please contact patient to let her now.

## 2022-09-03 NOTE — Telephone Encounter (Signed)
Patient aware appt changed to 10/01/22

## 2022-09-04 ENCOUNTER — Telehealth: Payer: Self-pay | Admitting: Orthopaedic Surgery

## 2022-09-04 DIAGNOSIS — K601 Chronic anal fissure: Secondary | ICD-10-CM | POA: Diagnosis not present

## 2022-09-04 DIAGNOSIS — K7581 Nonalcoholic steatohepatitis (NASH): Secondary | ICD-10-CM | POA: Diagnosis not present

## 2022-09-04 DIAGNOSIS — M1812 Unilateral primary osteoarthritis of first carpometacarpal joint, left hand: Secondary | ICD-10-CM | POA: Diagnosis not present

## 2022-09-04 DIAGNOSIS — H401130 Primary open-angle glaucoma, bilateral, stage unspecified: Secondary | ICD-10-CM | POA: Diagnosis not present

## 2022-09-04 DIAGNOSIS — M4602 Spinal enthesopathy, cervical region: Secondary | ICD-10-CM | POA: Diagnosis not present

## 2022-09-04 DIAGNOSIS — E039 Hypothyroidism, unspecified: Secondary | ICD-10-CM | POA: Diagnosis not present

## 2022-09-04 DIAGNOSIS — J45909 Unspecified asthma, uncomplicated: Secondary | ICD-10-CM | POA: Diagnosis not present

## 2022-09-04 DIAGNOSIS — F4323 Adjustment disorder with mixed anxiety and depressed mood: Secondary | ICD-10-CM | POA: Diagnosis not present

## 2022-09-04 DIAGNOSIS — K589 Irritable bowel syndrome without diarrhea: Secondary | ICD-10-CM | POA: Diagnosis not present

## 2022-09-04 DIAGNOSIS — M75102 Unspecified rotator cuff tear or rupture of left shoulder, not specified as traumatic: Secondary | ICD-10-CM | POA: Diagnosis not present

## 2022-09-04 DIAGNOSIS — R32 Unspecified urinary incontinence: Secondary | ICD-10-CM | POA: Diagnosis not present

## 2022-09-04 DIAGNOSIS — M75101 Unspecified rotator cuff tear or rupture of right shoulder, not specified as traumatic: Secondary | ICD-10-CM | POA: Diagnosis not present

## 2022-09-04 DIAGNOSIS — K219 Gastro-esophageal reflux disease without esophagitis: Secondary | ICD-10-CM | POA: Diagnosis not present

## 2022-09-04 DIAGNOSIS — E7800 Pure hypercholesterolemia, unspecified: Secondary | ICD-10-CM | POA: Diagnosis not present

## 2022-09-04 DIAGNOSIS — H9190 Unspecified hearing loss, unspecified ear: Secondary | ICD-10-CM | POA: Diagnosis not present

## 2022-09-04 DIAGNOSIS — G43709 Chronic migraine without aura, not intractable, without status migrainosus: Secondary | ICD-10-CM | POA: Diagnosis not present

## 2022-09-04 DIAGNOSIS — H543 Unqualified visual loss, both eyes: Secondary | ICD-10-CM | POA: Diagnosis not present

## 2022-09-04 DIAGNOSIS — E669 Obesity, unspecified: Secondary | ICD-10-CM | POA: Diagnosis not present

## 2022-09-04 DIAGNOSIS — M48061 Spinal stenosis, lumbar region without neurogenic claudication: Secondary | ICD-10-CM | POA: Diagnosis not present

## 2022-09-04 DIAGNOSIS — F5104 Psychophysiologic insomnia: Secondary | ICD-10-CM | POA: Diagnosis not present

## 2022-09-04 DIAGNOSIS — K5790 Diverticulosis of intestine, part unspecified, without perforation or abscess without bleeding: Secondary | ICD-10-CM | POA: Diagnosis not present

## 2022-09-04 DIAGNOSIS — M5137 Other intervertebral disc degeneration, lumbosacral region: Secondary | ICD-10-CM | POA: Diagnosis not present

## 2022-09-04 DIAGNOSIS — Z7952 Long term (current) use of systemic steroids: Secondary | ICD-10-CM | POA: Diagnosis not present

## 2022-09-04 DIAGNOSIS — G8929 Other chronic pain: Secondary | ICD-10-CM | POA: Diagnosis not present

## 2022-09-04 DIAGNOSIS — I10 Essential (primary) hypertension: Secondary | ICD-10-CM | POA: Diagnosis not present

## 2022-09-04 NOTE — Telephone Encounter (Signed)
Sharyn Lull PT w/[facility] [phone]) called, lvm - saw the pt today for assessment for home PT, will see pt a couple times a week for 82 weeks, but recommends that pt get into outpatient therapy for better treatments and to provide other modalities.  She understands transportation has been an issue.  She would like to do whatever she can to help

## 2022-09-07 NOTE — Telephone Encounter (Signed)
Murrell Redden w/[facility] [phone]) called this morning to check on the message she left on Friday, stated she has additional information about a dx she has which causes her to have concern about PT, would like to speak to Dr. Brooke Bonito assistant or Dr. Luna Glasgow himself regarding how Dr. Luna Glasgow would like for her to proceed w/treatment.

## 2022-09-08 NOTE — Telephone Encounter (Signed)
Called [name]. No answer, left generic vm asking for call back.

## 2022-09-09 ENCOUNTER — Ambulatory Visit (HOSPITAL_COMMUNITY)
Admission: RE | Admit: 2022-09-09 | Discharge: 2022-09-09 | Disposition: A | Discharge status: Home / Self Care | Payer: Medicare Other | Source: Ambulatory Visit | Attending: Internal Medicine | Admitting: Internal Medicine

## 2022-09-09 DIAGNOSIS — K7581 Nonalcoholic steatohepatitis (NASH): Secondary | ICD-10-CM | POA: Insufficient documentation

## 2022-09-09 DIAGNOSIS — R935 Abnormal findings on diagnostic imaging of other abdominal regions, including retroperitoneum: Secondary | ICD-10-CM | POA: Diagnosis not present

## 2022-09-09 DIAGNOSIS — K7689 Other specified diseases of liver: Secondary | ICD-10-CM | POA: Diagnosis not present

## 2022-09-09 NOTE — Telephone Encounter (Signed)
Murrell Redden PT w/[facility] lvm returning Abby's call, please call [phone]

## 2022-09-10 ENCOUNTER — Telehealth: Payer: Self-pay | Admitting: Internal Medicine

## 2022-09-10 NOTE — Telephone Encounter (Signed)
Pt advised.

## 2022-09-10 NOTE — Telephone Encounter (Signed)
Patient called said Dr Posey Pronto mention to patient what kind of blood pressure machine to buy, patient has forgot. Can nurse give her a call with the  information

## 2022-09-11 NOTE — Telephone Encounter (Signed)
Late entry: 09/10/22 called Sharyn Lull back. No answer. Left generic vm asking for return call. Will try again 09/11/22.

## 2022-09-14 ENCOUNTER — Other Ambulatory Visit
Admission: RE | Admit: 2022-09-14 | Discharge: 2022-09-14 | Disposition: A | Discharge status: Home / Self Care | Payer: Medicare Other | Source: Ambulatory Visit | Attending: Ophthalmology | Admitting: Ophthalmology

## 2022-09-14 ENCOUNTER — Telehealth: Payer: Self-pay

## 2022-09-14 DIAGNOSIS — R519 Headache, unspecified: Secondary | ICD-10-CM | POA: Diagnosis not present

## 2022-09-14 DIAGNOSIS — H401112 Primary open-angle glaucoma, right eye, moderate stage: Secondary | ICD-10-CM | POA: Diagnosis not present

## 2022-09-14 LAB — CBC
HCT: 39.5 % (ref 36.0–46.0)
Hemoglobin: 13.2 g/dL (ref 12.0–15.0)
MCH: 32.1 pg (ref 26.0–34.0)
MCHC: 33.4 g/dL (ref 30.0–36.0)
MCV: 96.1 fL (ref 80.0–100.0)
Platelets: 195 10*3/uL (ref 150–400)
RBC: 4.11 MIL/uL (ref 3.87–5.11)
RDW: 12.1 % (ref 11.5–15.5)
WBC: 8.4 10*3/uL (ref 4.0–10.5)
nRBC: 0 % (ref 0.0–0.2)

## 2022-09-14 LAB — SEDIMENTATION RATE: Sed Rate: 12 mm/hr (ref 0–30)

## 2022-09-14 LAB — C-REACTIVE PROTEIN: CRP: 0.7 mg/dL (ref <1.0)

## 2022-09-14 NOTE — Telephone Encounter (Signed)
Murrell Redden with [place] left message stating that you and her seem to be playing phone tag. She needs some clarification of what Dr. Luna Glasgow wants for [name] [birth year] for her neck.  Her number is [phone]. Stated that you could also call her manager ([place]) at [phone], she is aware of what is needed

## 2022-09-15 ENCOUNTER — Ambulatory Visit: Payer: Medicare Other | Admitting: Internal Medicine

## 2022-09-15 NOTE — Telephone Encounter (Signed)
Spoke with Sharyn Lull. Advised her that per provider, OT/PT is to be stopped for now.

## 2022-09-22 ENCOUNTER — Ambulatory Visit (HOSPITAL_COMMUNITY)
Admission: RE | Admit: 2022-09-22 | Discharge: 2022-09-22 | Disposition: A | Discharge status: Home / Self Care | Payer: Medicare Other | Source: Ambulatory Visit | Attending: Internal Medicine | Admitting: Internal Medicine

## 2022-09-22 DIAGNOSIS — M542 Cervicalgia: Secondary | ICD-10-CM | POA: Diagnosis not present

## 2022-09-22 DIAGNOSIS — M5030 Other cervical disc degeneration, unspecified cervical region: Secondary | ICD-10-CM | POA: Diagnosis not present

## 2022-09-23 ENCOUNTER — Telehealth: Payer: Self-pay

## 2022-09-23 NOTE — Telephone Encounter (Signed)
Patient left message wanting an appointment for bilateral shoulder injections and transportation scheduled for her.

## 2022-10-01 ENCOUNTER — Encounter: Payer: Self-pay | Admitting: Internal Medicine

## 2022-10-01 ENCOUNTER — Ambulatory Visit (INDEPENDENT_AMBULATORY_CARE_PROVIDER_SITE_OTHER): Payer: Medicare Other | Admitting: Internal Medicine

## 2022-10-01 VITALS — BP 101/63 | HR 90 | Ht 67.0 in | Wt 218.2 lb

## 2022-10-01 DIAGNOSIS — F419 Anxiety disorder, unspecified: Secondary | ICD-10-CM | POA: Diagnosis not present

## 2022-10-01 DIAGNOSIS — R110 Nausea: Secondary | ICD-10-CM

## 2022-10-01 DIAGNOSIS — M1812 Unilateral primary osteoarthritis of first carpometacarpal joint, left hand: Secondary | ICD-10-CM

## 2022-10-01 DIAGNOSIS — E039 Hypothyroidism, unspecified: Secondary | ICD-10-CM

## 2022-10-01 DIAGNOSIS — I10 Essential (primary) hypertension: Secondary | ICD-10-CM | POA: Diagnosis not present

## 2022-10-01 DIAGNOSIS — F5104 Psychophysiologic insomnia: Secondary | ICD-10-CM

## 2022-10-01 DIAGNOSIS — M159 Polyosteoarthritis, unspecified: Secondary | ICD-10-CM | POA: Diagnosis not present

## 2022-10-01 MED ORDER — ONDANSETRON HCL 4 MG PO TABS: 4.0000 mg | ORAL_TABLET | Freq: Three times a day (TID) | ORAL | 1 refills | Status: DC | PRN

## 2022-10-01 MED ORDER — DICLOFENAC SODIUM 1 % EX GEL: 4.0000 g | Freq: Four times a day (QID) | CUTANEOUS | 2 refills | Status: DC

## 2022-10-01 MED ORDER — ZOLPIDEM TARTRATE 10 MG PO TABS: 10.0000 mg | ORAL_TABLET | Freq: Every evening | ORAL | 1 refills | Status: DC | PRN

## 2022-10-01 NOTE — Patient Instructions (Signed)
Please apply Voltaren gel for thumb pain.  Please continue to take other medications as prescribed.  Please continue to follow low salt diet and ambulate as tolerated.

## 2022-10-02 NOTE — Assessment & Plan Note (Signed)
On Levothyroxine Follows up with Dr Dorris Fetch

## 2022-10-02 NOTE — Assessment & Plan Note (Signed)
  BP Readings from Last 1 Encounters:  10/01/22 101/63   Well-controlled with Irbesartan 75 mg QD Counseled for compliance with the medications Advised DASH diet and moderate exercise/walking as tolerated

## 2022-10-02 NOTE — Assessment & Plan Note (Signed)
Takes Tylenol PRN Norco only for severe pain

## 2022-10-02 NOTE — Progress Notes (Signed)
Established Patient Office Visit  Subjective:  Patient ID: [name], female    DOB: 11-Jun-1951  Age: 71 y.o. MRN: [id number]  CC:  Chief Complaint  Patient presents with   Hand Pain    Patient states she is having L hand pain, not able to use her thumb. She is having trouble gripping, and opening items.     HPI [name] is a 71 y.o. female with past medical history of HTN, GERD, IBS-C, NASH, anal fissure, hypothyroidism, OA, DDD of lumbar spine, anxiety/panic disorder, insomnia, left sided blind eye, G6PD mutation and sickle cell trait who presents for f/u of her chronic medical conditions.  Her neck pain has improved now. She complains of left hand pain, around the base of the thumb, which is acute on chronic, recently worse for the last 2 weeks.  Denies any recent injury.  She has difficulty with gripping of objects.  Denies any numbness or tingling of the hand.  She has had steroid injection in the left hand in the past.  She is left handed.  HTN: BP is well-controlled. Takes medications regularly. Patient denies headache, dizziness, chest pain, dyspnea or palpitations.  She has very limited vision in the right eye after cataract surgery.  She has been having difficulty ambulating even at home, but denies any recent fall.   She takes Ambien for insomnia.  She has Xanax for severe anxiety/panic episode, but does not require it frequently.  She denies anhedonia, SI or HI.     Past Medical History:  Diagnosis Date   Anal fissure    Anxiety    Arthritis    Asthma    C. difficile diarrhea 09/17/2021   Cataract    Phreesia 01/05/2021   Chest pain    a. 02/2000 Cath: nl cors, EF 60%;  b. 10/2010 MV: nl LV, no ischemia/infarct;  c. 03/2014 Admit c/p, r/o->grief from husbands death.   Chronic RUQ pain 2007-12-07   EUS slightly dilated CBD (7.32m), otherwise nl   Depression    Diverticulosis    Eczema    Exposure to chemical inhalation mid 1970s   Fatty liver    G6PD  deficiency    Gallstones    GERD (gastroesophageal reflux disease)    Glaucoma    History of pulmonary embolus (PE)    Treated with Eliquis 203-03-16  HTN (hypertension)    Hypercholesteremia    a. intolerant to statins.   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 She has limited vision in the right eye.   Migraines    inner ocular   Ocular migraine    Palpitations    Pleurisy    Pneumonia 07/16/2021   Patient was exposed to a harsh chemical in the 1970's that created scar tissue in her lungs. She has had pneumonia several times in the past.   PONV (postoperative nausea and vomiting)    Pulmonary emboli (HCC)    Recurrent upper respiratory infection (URI)    Renal cyst    Retinal cyst    Sickle cell trait (HCC)    Tubular adenoma of colon 09/17/2011   Urticaria     Past Surgical History:  Procedure Laterality Date   ABDOMINAL HYSTERECTOMY     ADENOIDECTOMY     ANGIOPLASTY     APPENDECTOMY     BIOPSY N/A 03/14/2013  Procedure: SMALL BOWEL AND GASTRIC BIOPSIES (Procedure #1);  Surgeon: Danie Binder, MD;  Location: [facility];  Service: Endoscopy;  Laterality: N/A;   CARDIAC CATHETERIZATION Left 02/11/2000   CATARACT EXTRACTION Left    CATARACT EXTRACTION W/PHACO Right 03/19/2021   Procedure: CATARACT EXTRACTION PHACO AND INTRAOCULAR LENS PLACEMENT ([place]) RIGHT 6.75 00:50.4;  Surgeon: Leandrew Koyanagi, MD;  Location: [place];  Service: Ophthalmology;  Laterality: Right;   CHOLECYSTECTOMY  12/2007   COLONOSCOPY  12/22/2010   PXT:GGYIRS, cecal adenomatous polyp   COLONOSCOPY WITH PROPOFOL N/A 03/31/2016   Dr. Oneida Alar: four sessile polyps rectum/sigmoid colon, diverticulosi, ext/int hemorrhoids. hyperplastic polyps, next tcs 5 years.   COLONOSCOPY WITH PROPOFOL N/A 04/21/2021   anal fissure, non-bleeding internal hemorrhoids, right colon diverticulosis, one 2 mm polyp in  ascending, three 4-6 mm polyps in descending colon. Tubular adenomas. 5 year surveillance.   complete hysterectomy     ENTEROSCOPY N/A 03/14/2013   WNI:OEVO gastritis/ulcers has healed   ESOPHAGOGASTRODUODENOSCOPY  09/22/2011   JJK:KXFG gastritis/Duodenitis   EXCISIONAL HEMORRHOIDECTOMY     FLEXIBLE SIGMOIDOSCOPY N/A 03/14/2013   SLF:3 colon polyp removed/moderate sized internal hemorrhoids   FLEXIBLE SIGMOIDOSCOPY N/A 06/09/2016   Procedure: FLEXIBLE SIGMOIDOSCOPY;  Surgeon: Danie Binder, MD;  [facility];  Service: Endoscopy;  Laterality: N/A;  rectal polyps times 2   FLEXIBLE SIGMOIDOSCOPY N/A 03/18/2018   Procedure: FLEXIBLE SIGMOIDOSCOPY;  Surgeon: Danie Binder, MD;  [facility];  Service: Endoscopy;  Laterality: N/A;  9:15am   FOOT SURGERY     bunion removal left   GIVENS CAPSULE STUDY N/A 02/10/2013   Procedure: GIVENS CAPSULE STUDY;  Surgeon: Danie Binder, MD;  [facility];  Service: Endoscopy;  Laterality: N/A;  [place]     right    HEMORRHOID BANDING N/A 03/14/2013   Procedure: HEMORRHOID BANDING (Procedure #3)  3 bands applied [id number] Exp 02/02/2014 ;  Surgeon: Danie Binder, MD;  Location: [facility];  Service: Endoscopy;  Laterality: N/A;   HEMORRHOID SURGERY N/A 04/24/2016   Procedure: EXTENSIVE HEMORRHOIDECTOMY;  Surgeon: Vickie Epley, MD;  Location: [facility];  Service: General;  Laterality: N/A;   LAPAROSCOPY     adhesions   POLYPECTOMY N/A 03/14/2013   VEL:FYBO Gastritis . ULCERS SEEN ON MAY 6 HAVE HEALED   POLYPECTOMY  03/31/2016   Procedure: POLYPECTOMY;  Surgeon: Danie Binder, MD;  [facility];  Service: Endoscopy;;  sigmoid colon polyps x2, rectal polyps x2   POLYPECTOMY  04/21/2021   Procedure: POLYPECTOMY;  Surgeon: Eloise Harman, DO;  [facility];  Service: Endoscopy;;   RECTAL EXAM UNDER ANESTHESIA N/A 07/23/2021   Procedure: RECTAL EXAM UNDER ANESTHESIA;  Surgeon:  Leighton Ruff, MD;  Location: [facility];  Service: General;  Laterality: N/A;   SPHINCTEROTOMY N/A 07/23/2021   Procedure: CHEMICAL SPHINCTEROTOMY;  Surgeon: Leighton Ruff, MD;  Location: [place];  Service: General;  Laterality: N/A;   TONSILLECTOMY     TUBAL LIGATION      Family History  Problem Relation Age of Onset   Colon cancer Mother        40s   Urticaria Mother    Heart disease Mother    Colon polyps Mother    Cancer Father        oral   Crohn's disease Sister    Cancer Sister        around heart  Colon polyps Sister    Diabetes Brother    Other Brother        intestinal sugery   Colon polyps Brother    Colon cancer Maternal Grandmother    Colon cancer Maternal Grandfather    Colon cancer Paternal Grandfather    Anesthesia problems Neg Hx     Social History   Socioeconomic History   Marital status: Widowed    Spouse name: Not on file   Number of children: 1   Years of education: Not on file   Highest education level: Not on file  Occupational History   Occupation: homemaker    Employer: UNEMPLOYED  Tobacco Use   Smoking status: Former    Packs/day: 0.50    Years: 30.00    Total pack years: 15.00    Types: Cigarettes    Quit date: 10/15/1996    Years since quitting: 25.9   Smokeless tobacco: Never   Tobacco comments:    Stopped smoking ~ 2000  Vaping Use   Vaping Use: Never used  Substance and Sexual Activity   Alcohol use: Yes    Alcohol/week: 4.0 standard drinks of alcohol    Types: 4 Glasses of wine per week    Comment: 1 drinks every couple of weeks   Drug use: No   Sexual activity: Yes    Birth control/protection: Surgical  Other Topics Concern   Not on file  Social History Narrative   Does not routinely exercise. Husband passed in JUN 2015 due to prostate ca.   Social Determinants of Health   Financial Resource Strain: Low Risk  (05/18/2022)   Overall Financial Resource Strain (CARDIA)     Difficulty of Paying Living Expenses: Not hard at all  Food Insecurity: No Food Insecurity (05/18/2022)   Hunger Vital Sign    Worried About Running Out of Food in the Last Year: Never true    Ran Out of Food in the Last Year: Never true  Transportation Needs: Unmet Transportation Needs (08/13/2022)   PRAPARE - Hydrologist (Medical): Yes    Lack of Transportation (Non-Medical): Yes  Physical Activity: Insufficiently Active (05/18/2022)   Exercise Vital Sign    Days of Exercise per Week: 3 days    Minutes of Exercise per Session: 20 min  Stress: No Stress Concern Present (05/18/2022)   [place] of Stress : Not at all  Social Connections: Socially Isolated (05/18/2022)   Social Connection and Isolation Panel [NHANES]    Frequency of Communication with Friends and Family: Once a week    Frequency of Social Gatherings with Friends and Family: Once a week    Attends Religious Services: Never    Marine scientist or Organizations: No    Attends Archivist Meetings: Never    Marital Status: Widowed  Intimate Partner Violence: Not At Risk (05/18/2022)   Humiliation, Afraid, Rape, and Kick questionnaire    Fear of Current or Ex-Partner: No    Emotionally Abused: No    Physically Abused: No    Sexually Abused: No    Outpatient Medications Prior to Visit  Medication Sig Dispense Refill   albuterol (PROVENTIL) (2.5 MG/3ML) 0.083% nebulizer solution Take 3 mLs (2.5 mg total) by nebulization every 4 (four) hours as needed for wheezing or shortness of breath. 150 mL 3   albuterol (VENTOLIN HFA) 108 (90 Base) MCG/ACT inhaler Inhale 2 puffs into  the lungs every 4 (four) hours as needed for wheezing. 54 g 3   ALPRAZolam (XANAX) 1 MG tablet Take 1 tablet (1 mg total) by mouth 2 (two) times daily as needed for anxiety. (Patient taking differently: Take 0.5-1 mg by mouth at bedtime.)  180 tablet 3   baclofen (LIORESAL) 10 MG tablet Take 1 tablet (10 mg total) by mouth 2 (two) times daily. 30 each 0   brimonidine (ALPHAGAN) 0.2 % ophthalmic solution Place 1 drop into both eyes 2 (two) times daily. 15 mL 3   budesonide-formoterol (SYMBICORT) 160-4.5 MCG/ACT inhaler Inhale 2 puffs into the lungs as needed. 10.2 g 3   Cholecalciferol (VITAMIN D3) 50 MCG (2000 UT) TABS Take 2,000 Units by mouth daily.     dexlansoprazole (DEXILANT) 60 MG capsule Take 1 capsule (60 mg total) by mouth daily. 90 capsule 3   dicyclomine (BENTYL) 10 MG capsule Take one capsule before meals and at bedtime AS NEEDED for abdominal pain and frequent stool. HOLD for constipation (Patient taking differently: Take 10 mg by mouth daily as needed (IBS).) 120 capsule 1   DUREZOL 0.05 % EMUL Place 1 drop into the right eye daily.     estradiol (ESTRACE VAGINAL) 0.1 MG/GM vaginal cream Place 1 Applicatorful vaginally daily as needed. 42.5 g 3   fenofibrate (TRICOR) 145 MG tablet Take 1 tablet (145 mg total) by mouth daily. 90 tablet 3   hyoscyamine (ANASPAZ) 0.125 MG TBDP disintergrating tablet Place 1 tablet (0.125 mg total) under the tongue daily as needed (IBS). Place 0.125 mg under the tongue daily as needed (IBS). 30 tablet 0   irbesartan (AVAPRO) 150 MG tablet Take 0.5 tablets (75 mg total) by mouth daily. 45 tablet 3   latanoprost (XALATAN) 0.005 % ophthalmic solution Place 1 drop into both eyes at bedtime. 9 mL 3   levothyroxine (SYNTHROID) 25 MCG tablet Take 1 tablet (25 mcg total) by mouth daily before breakfast. 95 tablet 1   Lidocaine-Hydrocortisone Ace 3-2.5 % KIT APPLY TO RECTUM QID for 2 weeks then AS NEEDED FOR RECTAL PAIN OR BLEEDING 1 kit 11   Lifitegrast (XIIDRA) 5 % SOLN INSTILL 1 DROP IN BOTH EYES EVERY DAY AS NEEDED (EACH CONTAINER SHOULD YIELD 2 DROPS) STORE IN ORIGINAL FOIL POUCH (Patient taking differently: Place 1 drop into both eyes daily. (EACH CONTAINER SHOULD YIELD 2 DROPS) STORE IN  ORIGINAL FOIL POUCH) 60 each 3   Magnesium 500 MG TABS Take 1 tablet by mouth daily.     meclizine (ANTIVERT) 12.5 MG tablet Take 1 tablet (12.5 mg total) by mouth 3 (three) times daily as needed for dizziness. (Patient taking differently: Take 12.5 mg by mouth 3 (three) times daily as needed (vertigo).) 30 tablet 3   Menthol, Topical Analgesic, (BIOFREEZE EX) Apply 1 application topically daily as needed (pain).     metroNIDAZOLE (METROCREAM) 0.75 % cream Apply 1 application. topically as needed. 45 g 3   neomycin-polymyxin b-dexamethasone (MAXITROL) 3.5-10000-0.1 OINT Place 1 application into both eyes at bedtime as needed. (Patient taking differently: Place 1 application  into both eyes at bedtime as needed (Dry eyes).) 10.5 g 3   nitroGLYCERIN (NITROLINGUAL) 0.4 MG/SPRAY spray Place 1 spray under the tongue every 5 (five) minutes as needed for chest pain. 12 g 3   NON FORMULARY Take 1 tablet by mouth daily as needed (Stomach Bloat). FD GUARD     prednisoLONE acetate (PRED FORTE) 1 % ophthalmic suspension Place 1 drop into the right eye 3 (  three) times daily.     Probiotic Product (PROBIOTIC DAILY PO) Take 1 capsule by mouth daily. VSL #3     rizatriptan (MAXALT-MLT) 10 MG disintegrating tablet Take 1 tablet (10 mg total) by mouth as needed for migraine. May repeat in 2 hours if needed 9 tablet 0   UNABLE TO FIND Place 1 application rectally daily as needed. [place] Name: lidocaine 5% pramoxine 1% ointment per rectum as needed.     Hydrocod Polst-Chlorphen Polst (CHLORPHENIRAMINE-HYDROCODONE) 10-8 MG/5ML Take 5 mLs by mouth every 12 (twelve) hours as needed for cough. 115 mL 0   HYDROcodone-acetaminophen (NORCO/VICODIN) 5-325 MG tablet Take one tablet by mouth twice  daily as needed for uncontrolled pain 8 tablet 0   naproxen (NAPROSYN) 500 MG tablet Take 1 tablet (500 mg total) by mouth daily as needed for moderate pain or mild pain. 30 tablet 0   ondansetron (ZOFRAN) 4 MG  tablet Take 1 tablet (4 mg total) by mouth every 8 (eight) hours as needed for nausea or vomiting. 30 tablet 1   predniSONE (STERAPRED UNI-PAK 21 TAB) 10 MG (21) TBPK tablet Take as package instructions. 1 each 0   zolpidem (AMBIEN) 10 MG tablet Take 1 tablet (10 mg total) by mouth at bedtime as needed for sleep. 90 tablet 1   No facility-administered medications prior to visit.    Allergies  Allergen Reactions   Nutmeg Oil (Myristica Oil) Anaphylaxis    Nut oil- skin irritation. Nut oil- skin irritation.   Adhesive [Tape] Other (See Comments)    Blisters skin   Aspirin Other (See Comments)    sickle cell trait--  Not recommended unless emergency   Imitrex [Sumatriptan] Hives   Keflex [Cephalexin] Other (See Comments)    G6PD   Levaquin [Levofloxacin In D5w]     Altered mental status Affects G6PD   Other     Nut Oil- skin irritation    Statins Other (See Comments)    Myopathy - elevated CK Myopathy - elevated CK Myopathy-elevated CK   Sulfa Antibiotics Other (See Comments)    Patient has sickle cell trait Sickle cell trait.   Sulfonamide Derivatives Other (See Comments)    Patient has sickle cell trait   Latex Rash    ROS Review of Systems  Constitutional:  Negative for chills and fever.  HENT:  Negative for congestion, sinus pressure, sinus pain and sore throat.   Eyes:  Positive for photophobia, pain and visual disturbance. Negative for discharge.  Respiratory:  Positive for cough. Negative for shortness of breath and wheezing.   Cardiovascular:  Negative for chest pain and palpitations.  Gastrointestinal:  Positive for constipation. Negative for abdominal pain, blood in stool, diarrhea, nausea and vomiting.  Endocrine: Negative for polydipsia and polyuria.  Genitourinary:  Negative for dysuria and hematuria.  Musculoskeletal:  Positive for arthralgias, back pain, neck pain and neck stiffness.  Skin:  Negative for rash.  Neurological:  Negative for dizziness and  weakness.  Psychiatric/Behavioral:  Negative for agitation and behavioral problems.       Objective:    Physical Exam Vitals reviewed.  Constitutional:      General: She is not in acute distress.    Appearance: She is not diaphoretic.  HENT:     Head: Normocephalic and atraumatic.     Nose: Nose normal.     Mouth/Throat:     Mouth: Mucous membranes are moist.  Eyes:     General: No scleral icterus.  Extraocular Movements: Extraocular movements intact.  Cardiovascular:     Rate and Rhythm: Normal rate and regular rhythm.     Pulses: Normal pulses.     Heart sounds: Normal heart sounds. No murmur heard. Pulmonary:     Breath sounds: Normal breath sounds. No wheezing or rales.  Musculoskeletal:        General: Tenderness (Lower lumbar spine area) present.     Left hand: Decreased strength of thumb/finger opposition.     Cervical back: Neck supple. No tenderness.     Right lower leg: No edema.     Left lower leg: No edema.  Skin:    General: Skin is warm.     Findings: No rash.  Neurological:     General: No focal deficit present.     Mental Status: She is alert and oriented to person, place, and time.     Sensory: No sensory deficit.     Motor: No weakness.  Psychiatric:        Mood and Affect: Mood normal.        Behavior: Behavior normal.     BP 101/63 (BP Location: Left Arm, Patient Position: Sitting, Cuff Size: Large)   Pulse 90   Ht _0  (1.702 m)   Wt 218 lb 3.2 oz (99 kg)   SpO2 90%   BMI 34.17 kg/m  Wt Readings from Last 3 Encounters:  10/01/22 218 lb 3.2 oz (99 kg)  09/02/22 219 lb 12.8 oz (99.7 kg)  08/21/22 220 lb (99.8 kg)    Lab Results  Component Value Date   TSH 2.306 04/27/2022   Lab Results  Component Value Date   WBC 8.4 09/14/2022   HGB 13.2 09/14/2022   HCT 39.5 09/14/2022   MCV 96.1 09/14/2022   PLT 195 09/14/2022   Lab Results  Component Value Date   NA 136 04/27/2022   K 3.6 04/27/2022   CO2 22 04/27/2022   GLUCOSE 130  (H) 04/27/2022   BUN 14 04/27/2022   CREATININE 1.10 (H) 04/27/2022   BILITOT 1.4 (H) 04/27/2022   ALKPHOS 33 (L) 04/27/2022   AST 65 (H) 04/27/2022   ALT 36 04/27/2022   PROT 7.3 04/27/2022   ALBUMIN 4.1 04/27/2022   CALCIUM 9.3 04/27/2022   ANIONGAP 7 04/27/2022   EGFR 56 (L) 01/20/2022   Lab Results  Component Value Date   CHOL 205 (H) 01/20/2022   Lab Results  Component Value Date   HDL 26 (L) 01/20/2022   Lab Results  Component Value Date   LDLCALC 139 (H) 01/20/2022   Lab Results  Component Value Date   TRIG 222 (H) 01/20/2022   Lab Results  Component Value Date   CHOLHDL 7.9 (H) 01/20/2022   Lab Results  Component Value Date   HGBA1C 5.2 01/20/2022      Assessment & Plan:   Problem List Items Addressed This Visit       Cardiovascular and Mediastinum   Essential hypertension, benign - Primary     BP Readings from Last 1 Encounters:  10/01/22 101/63  Well-controlled with Irbesartan 75 mg QD Counseled for compliance with the medications Advised DASH diet and moderate exercise/walking as tolerated        Endocrine   Hypothyroidism    On Levothyroxine Follows up with Dr Dorris Fetch        Musculoskeletal and Integument   Arthritis of carpometacarpal [facility]) joint of left thumb    Tylenol arthritis as needed Voltaren gel as  needed      Relevant Medications   diclofenac Sodium (VOLTAREN) 1 % GEL   Generalized OA    Takes Tylenol PRN Norco only for severe pain        Other   Chronic insomnia (Chronic)    Takes Ambien 10 mg qHS PRN      Relevant Medications   zolpidem (AMBIEN) 10 MG tablet   Anxiety    H/o panic disorder as well Xanax 1 mg BID PRN, takes it only for panic episodes      Other Visit Diagnoses     Nausea       Relevant Medications   ondansetron (ZOFRAN) 4 MG tablet       Meds ordered this encounter  Medications   diclofenac Sodium (VOLTAREN) 1 % GEL    Sig: Apply 4 g topically 4 (four) times daily.    Dispense:   100 g    Refill:  2   ondansetron (ZOFRAN) 4 MG tablet    Sig: Take 1 tablet (4 mg total) by mouth every 8 (eight) hours as needed for nausea or vomiting.    Dispense:  30 tablet    Refill:  1   zolpidem (AMBIEN) 10 MG tablet    Sig: Take 1 tablet (10 mg total) by mouth at bedtime as needed for sleep.    Dispense:  90 tablet    Refill:  1    FAX VO:[facility] BY MAIL- [facility] - [phone]    Follow-up: Return in about 4 months (around 01/31/2023) for HTN and hypothyroidism.    Lindell Spar, MD

## 2022-10-02 NOTE — Assessment & Plan Note (Signed)
Takes Ambien 10 mg qHS PRN

## 2022-10-02 NOTE — Assessment & Plan Note (Signed)
H/o panic disorder as well Xanax 1 mg BID PRN, takes it only for panic episodes

## 2022-10-02 NOTE — Assessment & Plan Note (Signed)
Tylenol arthritis as needed Voltaren gel as needed

## 2022-10-04 DIAGNOSIS — K589 Irritable bowel syndrome without diarrhea: Secondary | ICD-10-CM | POA: Diagnosis not present

## 2022-10-04 DIAGNOSIS — H401130 Primary open-angle glaucoma, bilateral, stage unspecified: Secondary | ICD-10-CM | POA: Diagnosis not present

## 2022-10-04 DIAGNOSIS — H9190 Unspecified hearing loss, unspecified ear: Secondary | ICD-10-CM | POA: Diagnosis not present

## 2022-10-04 DIAGNOSIS — M1812 Unilateral primary osteoarthritis of first carpometacarpal joint, left hand: Secondary | ICD-10-CM | POA: Diagnosis not present

## 2022-10-04 DIAGNOSIS — M75102 Unspecified rotator cuff tear or rupture of left shoulder, not specified as traumatic: Secondary | ICD-10-CM | POA: Diagnosis not present

## 2022-10-04 DIAGNOSIS — K7581 Nonalcoholic steatohepatitis (NASH): Secondary | ICD-10-CM | POA: Diagnosis not present

## 2022-10-04 DIAGNOSIS — E669 Obesity, unspecified: Secondary | ICD-10-CM | POA: Diagnosis not present

## 2022-10-04 DIAGNOSIS — J45909 Unspecified asthma, uncomplicated: Secondary | ICD-10-CM | POA: Diagnosis not present

## 2022-10-04 DIAGNOSIS — R32 Unspecified urinary incontinence: Secondary | ICD-10-CM | POA: Diagnosis not present

## 2022-10-04 DIAGNOSIS — F5104 Psychophysiologic insomnia: Secondary | ICD-10-CM | POA: Diagnosis not present

## 2022-10-04 DIAGNOSIS — G43709 Chronic migraine without aura, not intractable, without status migrainosus: Secondary | ICD-10-CM | POA: Diagnosis not present

## 2022-10-04 DIAGNOSIS — M4602 Spinal enthesopathy, cervical region: Secondary | ICD-10-CM | POA: Diagnosis not present

## 2022-10-04 DIAGNOSIS — H543 Unqualified visual loss, both eyes: Secondary | ICD-10-CM | POA: Diagnosis not present

## 2022-10-04 DIAGNOSIS — M48061 Spinal stenosis, lumbar region without neurogenic claudication: Secondary | ICD-10-CM | POA: Diagnosis not present

## 2022-10-04 DIAGNOSIS — E039 Hypothyroidism, unspecified: Secondary | ICD-10-CM | POA: Diagnosis not present

## 2022-10-04 DIAGNOSIS — M75101 Unspecified rotator cuff tear or rupture of right shoulder, not specified as traumatic: Secondary | ICD-10-CM | POA: Diagnosis not present

## 2022-10-04 DIAGNOSIS — G8929 Other chronic pain: Secondary | ICD-10-CM | POA: Diagnosis not present

## 2022-10-04 DIAGNOSIS — K601 Chronic anal fissure: Secondary | ICD-10-CM | POA: Diagnosis not present

## 2022-10-04 DIAGNOSIS — F4323 Adjustment disorder with mixed anxiety and depressed mood: Secondary | ICD-10-CM | POA: Diagnosis not present

## 2022-10-04 DIAGNOSIS — K219 Gastro-esophageal reflux disease without esophagitis: Secondary | ICD-10-CM | POA: Diagnosis not present

## 2022-10-04 DIAGNOSIS — K5790 Diverticulosis of intestine, part unspecified, without perforation or abscess without bleeding: Secondary | ICD-10-CM | POA: Diagnosis not present

## 2022-10-04 DIAGNOSIS — M5137 Other intervertebral disc degeneration, lumbosacral region: Secondary | ICD-10-CM | POA: Diagnosis not present

## 2022-10-04 DIAGNOSIS — I10 Essential (primary) hypertension: Secondary | ICD-10-CM | POA: Diagnosis not present

## 2022-10-04 DIAGNOSIS — Z7952 Long term (current) use of systemic steroids: Secondary | ICD-10-CM | POA: Diagnosis not present

## 2022-10-04 DIAGNOSIS — E7800 Pure hypercholesterolemia, unspecified: Secondary | ICD-10-CM | POA: Diagnosis not present

## 2022-10-06 ENCOUNTER — Telehealth: Payer: Self-pay | Admitting: Orthopaedic Surgery

## 2022-10-06 ENCOUNTER — Ambulatory Visit (INDEPENDENT_AMBULATORY_CARE_PROVIDER_SITE_OTHER): Payer: Medicare Other | Admitting: Orthopaedic Surgery

## 2022-10-06 ENCOUNTER — Encounter: Payer: Self-pay | Admitting: Orthopaedic Surgery

## 2022-10-06 VITALS — BP 143/71 | HR 95 | Ht 67.0 in | Wt 218.0 lb

## 2022-10-06 DIAGNOSIS — M25511 Pain in right shoulder: Secondary | ICD-10-CM | POA: Diagnosis not present

## 2022-10-06 DIAGNOSIS — M25512 Pain in left shoulder: Secondary | ICD-10-CM

## 2022-10-06 DIAGNOSIS — G8929 Other chronic pain: Secondary | ICD-10-CM

## 2022-10-06 MED ORDER — NAPROXEN 500 MG PO TABS: 500.0000 mg | ORAL_TABLET | Freq: Two times a day (BID) | ORAL | 5 refills | Status: DC

## 2022-10-06 MED ORDER — METHYLPREDNISOLONE ACETATE 40 MG/ML IJ SUSP
40.0000 mg | Freq: Once | INTRAMUSCULAR | Status: AC
  Administered 2022-10-06: 40 mg via INTRA_ARTICULAR

## 2022-10-06 NOTE — Progress Notes (Signed)
My shoulders and left hand are hurting.  She has more pain in both shoulders but the right is worse.  She has no new trauma, no weakness.  The left hand has numbness and pain of the left thumb to motion and also numbness to the thumb and long finger.  She has a special glove she uses and it has helped.    She had been on long tapered dose of prednisone and is off it now.  I will resume Naprosyn which she has taken in the past and it helped.  Both shoulders have decreased motion and pain.  NV intact.  No effusions.  Left hand with decreased sensation in the median nerve distribution.  Motion of left thumb tender but full.  Encounter Diagnoses  Name Primary?   Chronic pain in right shoulder Yes   Chronic pain in left shoulder    I am concerned about possible carpal tunnel.  She may need EMGs.  She has PT set up for home for the shoulders.  PROCEDURE NOTE:  The patient request injection, verbal consent was obtained.  The left shoulder was prepped appropriately after time out was performed.   Sterile technique was observed and injection of 1 cc of DepoMedrol '40mg'$  with several cc's of plain xylocaine. Anesthesia was provided by ethyl chloride and a 20-gauge needle was used to inject the shoulder area. A posterior approach was used.  The injection was tolerated well.  A band aid dressing was applied.  The patient was advised to apply ice later today and tomorrow to the injection sight as needed.   PROCEDURE NOTE:  The patient request injection, verbal consent was obtained.  The right shoulder was prepped appropriately after time out was performed.   Sterile technique was observed and injection of 1 cc of DepoMedrol '40mg'$  with several cc's of plain xylocaine. Anesthesia was provided by ethyl chloride and a 20-gauge needle was used to inject the shoulder area. A posterior approach was used.  The injection was tolerated well.  A band aid dressing was applied.  The patient was  advised to apply ice later today and tomorrow to the injection sight as needed.  Return in two weeks.  Call if any problem.  Precautions discussed.  Electronically Signed Sanjuana Kava, MD 1/2/20242:58 PM

## 2022-10-06 NOTE — Patient Instructions (Signed)
Return office visit: 2 weeks bilateral shoulder pain/LT hand pain  Continue the topical rubs and compression glove for your hand.

## 2022-10-06 NOTE — Progress Notes (Unsigned)
Spoke with Sharyn Lull at [facility] Advised her patient's home health OT can be resumed with no restrictions. Asked her if they could work with patients hand of if I needed to send in a new order. She states they can work with that without new order. Sharyn Lull will let the office know patient can resume HHPT.

## 2022-10-06 NOTE — Telephone Encounter (Signed)
Patient was seen today, has a f/u appt for 10/20/22 at 2:20pm.  She stated that she'll need transportation if you will help her w/this.  Pt's # [phone]

## 2022-10-08 ENCOUNTER — Telehealth: Payer: Self-pay | Admitting: Neurology

## 2022-10-08 DIAGNOSIS — G8929 Other chronic pain: Secondary | ICD-10-CM | POA: Diagnosis not present

## 2022-10-08 DIAGNOSIS — M5137 Other intervertebral disc degeneration, lumbosacral region: Secondary | ICD-10-CM | POA: Diagnosis not present

## 2022-10-08 DIAGNOSIS — M75101 Unspecified rotator cuff tear or rupture of right shoulder, not specified as traumatic: Secondary | ICD-10-CM | POA: Diagnosis not present

## 2022-10-08 DIAGNOSIS — M48061 Spinal stenosis, lumbar region without neurogenic claudication: Secondary | ICD-10-CM | POA: Diagnosis not present

## 2022-10-08 DIAGNOSIS — M1812 Unilateral primary osteoarthritis of first carpometacarpal joint, left hand: Secondary | ICD-10-CM | POA: Diagnosis not present

## 2022-10-08 DIAGNOSIS — M75102 Unspecified rotator cuff tear or rupture of left shoulder, not specified as traumatic: Secondary | ICD-10-CM | POA: Diagnosis not present

## 2022-10-08 NOTE — Telephone Encounter (Signed)
Attempted to call [facility] to clarify what they needed for her prescription and line was busy and eventually hung up on me while waiting to speak to someone. Patient has follow up on 01.10.24 with Camara.

## 2022-10-08 NOTE — Telephone Encounter (Signed)
Pt states [facility] will not give her nurtec, they need a letter stating pt is not allergic to nurtec.

## 2022-10-12 NOTE — Telephone Encounter (Signed)
Transportation requested today.

## 2022-10-13 DIAGNOSIS — M5137 Other intervertebral disc degeneration, lumbosacral region: Secondary | ICD-10-CM | POA: Diagnosis not present

## 2022-10-13 DIAGNOSIS — M75102 Unspecified rotator cuff tear or rupture of left shoulder, not specified as traumatic: Secondary | ICD-10-CM | POA: Diagnosis not present

## 2022-10-13 DIAGNOSIS — G8929 Other chronic pain: Secondary | ICD-10-CM | POA: Diagnosis not present

## 2022-10-13 DIAGNOSIS — M75101 Unspecified rotator cuff tear or rupture of right shoulder, not specified as traumatic: Secondary | ICD-10-CM | POA: Diagnosis not present

## 2022-10-13 DIAGNOSIS — M1812 Unilateral primary osteoarthritis of first carpometacarpal joint, left hand: Secondary | ICD-10-CM | POA: Diagnosis not present

## 2022-10-13 DIAGNOSIS — M48061 Spinal stenosis, lumbar region without neurogenic claudication: Secondary | ICD-10-CM | POA: Diagnosis not present

## 2022-10-14 ENCOUNTER — Encounter: Payer: Self-pay | Admitting: Neurology

## 2022-10-14 ENCOUNTER — Ambulatory Visit (INDEPENDENT_AMBULATORY_CARE_PROVIDER_SITE_OTHER): Payer: Medicare Other | Admitting: Neurology

## 2022-10-14 VITALS — BP 125/71 | HR 76 | Ht 67.0 in | Wt 217.0 lb

## 2022-10-14 DIAGNOSIS — G43009 Migraine without aura, not intractable, without status migrainosus: Secondary | ICD-10-CM | POA: Diagnosis not present

## 2022-10-14 MED ORDER — RIZATRIPTAN BENZOATE 10 MG PO TBDP: 10.0000 mg | ORAL_TABLET | ORAL | 6 refills | Status: DC | PRN

## 2022-10-14 NOTE — Patient Instructions (Signed)
Continue with Maxalt as needed, refill given  She will contact us if she needs additional Nurtec, at that time we will have to clearly note that patient is not allergic to Nurtec  Continue your other medications  Return in 6 months

## 2022-10-14 NOTE — Progress Notes (Signed)
[facility]  PATIENT: [name] DOB: December 07, 1950  REQUESTING CLINICIAN: Lindell Spar, MD HISTORY FROM: Patient  REASON FOR VISIT: Right arm and leg weakness/Migraines/Confusion   HISTORICAL  CHIEF COMPLAINT:  Chief Complaint  Patient presents with   Follow-up    Rm 13. Alone. Migraines have been less frequent. She has only taken 5 rizatriptan, they work well. She has not had a migraine in approximately 2 months.   INTERVAL HISTORY 10/14/22:  Patient presents today for follow-up, last visit was in September.  At that time we have started her on Maxalt for abortive medication and Nurtec for preventive medication.  Due to previous report of patient being allergic to Nurtec, the Nurtec was not renewed.  She reports since September she had a total of 5 migraines in which the Maxalt has helped.  She reports Maxalt controlled her headaches.  She does not need the Nurtec as preventive medication anymore. She has not had any headaches for the past 2 months    INTERVAL HISTORY 06/24/22 Patient presents today for follow-up, last visit was in March.  Since then, she reports that her back pain has improved after getting back injections.  When it comes to the headaches, she denies any change in frequency.  At last visit I have started her on Nurtec every other day but patient is not sure if she is taking it.  She stated she takes a lot of medication and she is unable to tell me if she take Nurtec or not.  Currently when she is has a headache she does not take any medication.  Her MRI brain was completed, did not show any acute intracranial abnormality.   HISTORY OF PRESENT ILLNESS:  This is a 72 year old woman with multiple medical conditions including chronic pain, hypertension, hyperlipidemia, glaucoma, hypothyroidism, migraine headaches who is presenting with complaint of right-sided weakness.  Patient reports history of right arm and right leg weakness. She presented to  her orthopedist Dr. Luna Glasgow and due to the complaint of right-sided arm and leg weakness, she was referred to Neurology.  At that same time she was also given a right shoulder ESI and had MRI of her shoulder.  MRI shoulder showed evidence of tears and arthritis and patient also reported that after the injection she felt much better.  At that time she did not have any neck pain.  Currently she reported the shoulder pain resolved, she is not experiencing neck pain, she is able to lift her right arm above her shoulder and this is a marked improvement.  For her right leg weakness pain, she attributed to chronic low back pain, she states that both legs sometimes do get weak. She denies any fall recently.    Patient also complained of migraine.  She reported a history of migraine, in the past she has tried Imitrex as abortive medication, as tired over-the-counter medications but lately her migraines have reappear.  Currently she is having 4 headaches days per week.  She is not on any abortive or preventive medication.  When she has the headaches, she reports putting ice or wrap her head with something cold and going to sleep.   She does also note that sometimes she get confused, sometimes has word finding difficulty but she is able to hold conversation, she is still independent in all activities of daily living and all IADLs  She reports that she lives alone, she does not have any family here, her son is in [place]  OTHER MEDICAL CONDITIONS: Chronic pain, hypertension, glaucoma, hyperlipidemia, hypothyroidism    REVIEW OF SYSTEMS: Full 14 system review of systems performed and negative with exception of: as noted in the HPI   ALLERGIES: Allergies  Allergen Reactions   Nutmeg Oil (Myristica Oil) Anaphylaxis    Nut oil- skin irritation. Nut oil- skin irritation.   Adhesive [Tape] Other (See Comments)    Blisters skin   Aspirin Other (See Comments)    sickle cell trait--  Not recommended unless  emergency   Imitrex [Sumatriptan] Hives   Keflex [Cephalexin] Other (See Comments)    G6PD   Levaquin [Levofloxacin In D5w]     Altered mental status Affects G6PD   Other     Nut Oil- skin irritation    Statins Other (See Comments)    Myopathy - elevated CK Myopathy - elevated CK Myopathy-elevated CK   Sulfa Antibiotics Other (See Comments)    Patient has sickle cell trait Sickle cell trait.   Sulfonamide Derivatives Other (See Comments)    Patient has sickle cell trait   Latex Rash    HOME MEDICATIONS: Outpatient Medications Prior to Visit  Medication Sig Dispense Refill   albuterol (PROVENTIL) (2.5 MG/3ML) 0.083% nebulizer solution Take 3 mLs (2.5 mg total) by nebulization every 4 (four) hours as needed for wheezing or shortness of breath. 150 mL 3   albuterol (VENTOLIN HFA) 108 (90 Base) MCG/ACT inhaler Inhale 2 puffs into the lungs every 4 (four) hours as needed for wheezing. 54 g 3   ALPRAZolam (XANAX) 1 MG tablet Take 1 tablet (1 mg total) by mouth 2 (two) times daily as needed for anxiety. (Patient taking differently: Take 0.5-1 mg by mouth at bedtime.) 180 tablet 3   baclofen (LIORESAL) 10 MG tablet Take 1 tablet (10 mg total) by mouth 2 (two) times daily. 30 each 0   brimonidine (ALPHAGAN) 0.2 % ophthalmic solution Place 1 drop into both eyes 2 (two) times daily. 15 mL 3   budesonide-formoterol (SYMBICORT) 160-4.5 MCG/ACT inhaler Inhale 2 puffs into the lungs as needed. 10.2 g 3   Cholecalciferol (VITAMIN D3) 50 MCG (2000 UT) TABS Take 2,000 Units by mouth daily.     dexlansoprazole (DEXILANT) 60 MG capsule Take 1 capsule (60 mg total) by mouth daily. 90 capsule 3   diclofenac Sodium (VOLTAREN) 1 % GEL Apply 4 g topically 4 (four) times daily. 100 g 2   dicyclomine (BENTYL) 10 MG capsule Take one capsule before meals and at bedtime AS NEEDED for abdominal pain and frequent stool. HOLD for constipation (Patient taking differently: Take 10 mg by mouth daily as needed (IBS).)  120 capsule 1   DUREZOL 0.05 % EMUL Place 1 drop into the right eye daily.     estradiol (ESTRACE VAGINAL) 0.1 MG/GM vaginal cream Place 1 Applicatorful vaginally daily as needed. 42.5 g 3   fenofibrate (TRICOR) 145 MG tablet Take 1 tablet (145 mg total) by mouth daily. 90 tablet 3   hyoscyamine (ANASPAZ) 0.125 MG TBDP disintergrating tablet Place 1 tablet (0.125 mg total) under the tongue daily as needed (IBS). Place 0.125 mg under the tongue daily as needed (IBS). 30 tablet 0   irbesartan (AVAPRO) 150 MG tablet Take 0.5 tablets (75 mg total) by mouth daily. 45 tablet 3   latanoprost (XALATAN) 0.005 % ophthalmic solution Place 1 drop into both eyes at bedtime. 9 mL 3   levothyroxine (SYNTHROID) 25 MCG tablet Take 1 tablet (25 mcg total) by mouth daily before breakfast.  95 tablet 1   Lidocaine-Hydrocortisone Ace 3-2.5 % KIT APPLY TO RECTUM QID for 2 weeks then AS NEEDED FOR RECTAL PAIN OR BLEEDING 1 kit 11   Lifitegrast (XIIDRA) 5 % SOLN INSTILL 1 DROP IN BOTH EYES EVERY DAY AS NEEDED (EACH CONTAINER SHOULD YIELD 2 DROPS) STORE IN ORIGINAL FOIL POUCH (Patient taking differently: Place 1 drop into both eyes daily. (EACH CONTAINER SHOULD YIELD 2 DROPS) STORE IN ORIGINAL FOIL POUCH) 60 each 3   Magnesium 500 MG TABS Take 1 tablet by mouth daily.     meclizine (ANTIVERT) 12.5 MG tablet Take 1 tablet (12.5 mg total) by mouth 3 (three) times daily as needed for dizziness. (Patient taking differently: Take 12.5 mg by mouth 3 (three) times daily as needed (vertigo).) 30 tablet 3   Menthol, Topical Analgesic, (BIOFREEZE EX) Apply 1 application topically daily as needed (pain).     metroNIDAZOLE (METROCREAM) 0.75 % cream Apply 1 application. topically as needed. 45 g 3   naproxen (NAPROSYN) 500 MG tablet Take 1 tablet (500 mg total) by mouth 2 (two) times daily with a meal. 60 tablet 5   neomycin-polymyxin b-dexamethasone (MAXITROL) 3.5-10000-0.1 OINT Place 1 application into both eyes at bedtime as needed.  (Patient taking differently: Place 1 application  into both eyes at bedtime as needed (Dry eyes).) 10.5 g 3   nitroGLYCERIN (NITROLINGUAL) 0.4 MG/SPRAY spray Place 1 spray under the tongue every 5 (five) minutes as needed for chest pain. 12 g 3   NON FORMULARY Take 1 tablet by mouth daily as needed (Stomach Bloat). FD GUARD     ondansetron (ZOFRAN) 4 MG tablet Take 1 tablet (4 mg total) by mouth every 8 (eight) hours as needed for nausea or vomiting. 30 tablet 1   prednisoLONE acetate (PRED FORTE) 1 % ophthalmic suspension Place 1 drop into the right eye 3 (three) times daily.     Probiotic Product (PROBIOTIC DAILY PO) Take 1 capsule by mouth daily. VSL #3     Rimegepant Sulfate (NURTEC) 75 MG TBDP Take 1 tablet by mouth as needed.     UNABLE TO FIND Place 1 application rectally daily as needed. [place] Name: lidocaine 5% pramoxine 1% ointment per rectum as needed.     zolpidem (AMBIEN) 10 MG tablet Take 1 tablet (10 mg total) by mouth at bedtime as needed for sleep. 90 tablet 1   rizatriptan (MAXALT-MLT) 10 MG disintegrating tablet Take 1 tablet (10 mg total) by mouth as needed for migraine. May repeat in 2 hours if needed 9 tablet 0   No facility-administered medications prior to visit.    PAST MEDICAL HISTORY: Past Medical History:  Diagnosis Date   Anal fissure    Anxiety    Arthritis    Asthma    C. difficile diarrhea 09/17/2021   Cataract    Phreesia 01/05/2021   Chest pain    a. 02/2000 Cath: nl cors, EF 60%;  b. 10/2010 MV: nl LV, no ischemia/infarct;  c. 03/2014 Admit c/p, r/o->grief from husbands death.   Chronic RUQ pain 11-Jan-2008   EUS slightly dilated CBD (7.51m), otherwise nl   Depression    Diverticulosis    Eczema    Exposure to chemical inhalation mid 1970s   Fatty liver    G6PD deficiency    Gallstones    GERD (gastroesophageal reflux disease)    Glaucoma    History of pulmonary embolus (PE)    Treated with Eliquis 204-05-2015  HTN (hypertension)  Hypercholesteremia    a. intolerant to statins.   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 She has limited vision in the right eye.   Migraines    inner ocular   Ocular migraine    Palpitations    Pleurisy    Pneumonia 07/16/2021   Patient was exposed to a harsh chemical in the 1970's that created scar tissue in her lungs. She has had pneumonia several times in the past.   PONV (postoperative nausea and vomiting)    Pulmonary emboli (HCC)    Recurrent upper respiratory infection (URI)    Renal cyst    Retinal cyst    Sickle cell trait (HCC)    Tubular adenoma of colon 09/17/2011   Urticaria     PAST SURGICAL HISTORY: Past Surgical History:  Procedure Laterality Date   ABDOMINAL HYSTERECTOMY     ADENOIDECTOMY     ANGIOPLASTY     APPENDECTOMY     BIOPSY N/A 03/14/2013   Procedure: SMALL BOWEL AND GASTRIC BIOPSIES (Procedure #1);  Surgeon: Danie Binder, MD;  Location: [facility];  Service: Endoscopy;  Laterality: N/A;   CARDIAC CATHETERIZATION Left 02/11/2000   CATARACT EXTRACTION Left    CATARACT EXTRACTION W/PHACO Right 03/19/2021   Procedure: CATARACT EXTRACTION PHACO AND INTRAOCULAR LENS PLACEMENT ([place]) RIGHT 6.75 00:50.4;  Surgeon: Leandrew Koyanagi, MD;  Location: [place];  Service: Ophthalmology;  Laterality: Right;   CHOLECYSTECTOMY  12/2007   COLONOSCOPY  12/22/2010   BTD:HRCBUL, cecal adenomatous polyp   COLONOSCOPY WITH PROPOFOL N/A 03/31/2016   Dr. Oneida Alar: four sessile polyps rectum/sigmoid colon, diverticulosi, ext/int hemorrhoids. hyperplastic polyps, next tcs 5 years.   COLONOSCOPY WITH PROPOFOL N/A 04/21/2021   anal fissure, non-bleeding internal hemorrhoids, right colon diverticulosis, one 2 mm polyp in ascending, three 4-6 mm polyps in descending colon. Tubular adenomas. 5 year surveillance.   complete hysterectomy     ENTEROSCOPY N/A 03/14/2013   AGT:XMIW  gastritis/ulcers has healed   ESOPHAGOGASTRODUODENOSCOPY  09/22/2011   OEH:OZYY gastritis/Duodenitis   EXCISIONAL HEMORRHOIDECTOMY     FLEXIBLE SIGMOIDOSCOPY N/A 03/14/2013   SLF:3 colon polyp removed/moderate sized internal hemorrhoids   FLEXIBLE SIGMOIDOSCOPY N/A 06/09/2016   Procedure: FLEXIBLE SIGMOIDOSCOPY;  Surgeon: Danie Binder, MD;  [facility];  Service: Endoscopy;  Laterality: N/A;  rectal polyps times 2   FLEXIBLE SIGMOIDOSCOPY N/A 03/18/2018   Procedure: FLEXIBLE SIGMOIDOSCOPY;  Surgeon: Danie Binder, MD;  [facility];  Service: Endoscopy;  Laterality: N/A;  9:15am   FOOT SURGERY     bunion removal left   GIVENS CAPSULE STUDY N/A 02/10/2013   Procedure: GIVENS CAPSULE STUDY;  Surgeon: Danie Binder, MD;  [facility];  Service: Endoscopy;  Laterality: N/A;  [place]     right    HEMORRHOID BANDING N/A 03/14/2013   Procedure: HEMORRHOID BANDING (Procedure #3)  3 bands applied [id number] Exp 02/02/2014 ;  Surgeon: Danie Binder, MD;  Location: [facility];  Service: Endoscopy;  Laterality: N/A;   HEMORRHOID SURGERY N/A 04/24/2016   Procedure: EXTENSIVE HEMORRHOIDECTOMY;  Surgeon: Vickie Epley, MD;  Location: [facility];  Service: General;  Laterality: N/A;   LAPAROSCOPY     adhesions   POLYPECTOMY N/A 03/14/2013   GQB:VQXI Gastritis . ULCERS SEEN ON MAY 6 HAVE HEALED   POLYPECTOMY  03/31/2016  Procedure: POLYPECTOMY;  Surgeon: Danie Binder, MD;  [facility];  Service: Endoscopy;;  sigmoid colon polyps x2, rectal polyps x2   POLYPECTOMY  04/21/2021   Procedure: POLYPECTOMY;  Surgeon: Eloise Harman, DO;  [facility];  Service: Endoscopy;;   RECTAL EXAM UNDER ANESTHESIA N/A 07/23/2021   Procedure: RECTAL EXAM UNDER ANESTHESIA;  Surgeon: Leighton Ruff, MD;  Location: [place];  Service: General;  Laterality: N/A;   SPHINCTEROTOMY N/A 07/23/2021   Procedure: CHEMICAL  SPHINCTEROTOMY;  Surgeon: Leighton Ruff, MD;  Location: [place];  Service: General;  Laterality: N/A;   TONSILLECTOMY     TUBAL LIGATION      FAMILY HISTORY: Family History  Problem Relation Age of Onset   Colon cancer Mother        74s   Urticaria Mother    Heart disease Mother    Colon polyps Mother    Cancer Father        oral   Crohn's disease Sister    Cancer Sister        around heart   Colon polyps Sister    Diabetes Brother    Other Brother        intestinal sugery   Colon polyps Brother    Colon cancer Maternal Grandmother    Colon cancer Maternal Grandfather    Colon cancer Paternal Grandfather    Anesthesia problems Neg Hx     SOCIAL HISTORY: Social History   Socioeconomic History   Marital status: Widowed    Spouse name: Not on file   Number of children: 1   Years of education: Not on file   Highest education level: Not on file  Occupational History   Occupation: homemaker    Employer: UNEMPLOYED  Tobacco Use   Smoking status: Former    Packs/day: 0.50    Years: 30.00    Total pack years: 15.00    Types: Cigarettes    Quit date: 10/15/1996    Years since quitting: 26.0   Smokeless tobacco: Never   Tobacco comments:    Stopped smoking ~ 2000  Vaping Use   Vaping Use: Never used  Substance and Sexual Activity   Alcohol use: Yes    Alcohol/week: 4.0 standard drinks of alcohol    Types: 4 Glasses of wine per week    Comment: 1 drinks every couple of weeks   Drug use: No   Sexual activity: Yes    Birth control/protection: Surgical  Other Topics Concern   Not on file  Social History Narrative   Does not routinely exercise. Husband passed in JUN 2015 due to prostate ca.   Social Determinants of Health   Financial Resource Strain: Low Risk  (05/18/2022)   Overall Financial Resource Strain (CARDIA)    Difficulty of Paying Living Expenses: Not hard at all  Food Insecurity: No Food Insecurity (05/18/2022)   Hunger Vital Sign     Worried About Running Out of Food in the Last Year: Never true    Ran Out of Food in the Last Year: Never true  Transportation Needs: Unmet Transportation Needs (08/13/2022)   PRAPARE - Hydrologist (Medical): Yes    Lack of Transportation (Non-Medical): Yes  Physical Activity: Insufficiently Active (05/18/2022)   Exercise Vital Sign    Days of Exercise per Week: 3 days    Minutes of Exercise per Session: 20 min  Stress: No Stress Concern Present (05/18/2022)  [place] Questionnaire    Feeling of Stress : Not at all  Social Connections: Socially Isolated (05/18/2022)   Social Connection and Isolation Panel [NHANES]    Frequency of Communication with Friends and Family: Once a week    Frequency of Social Gatherings with Friends and Family: Once a week    Attends Religious Services: Never    Marine scientist or Organizations: No    Attends Archivist Meetings: Never    Marital Status: Widowed  Intimate Partner Violence: Not At Risk (05/18/2022)   Humiliation, Afraid, Rape, and Kick questionnaire    Fear of Current or Ex-Partner: No    Emotionally Abused: No    Physically Abused: No    Sexually Abused: No    PHYSICAL EXAM    GENERAL EXAM/CONSTITUTIONAL: Vitals:  Vitals:   10/14/22 1355  BP: 125/71  Pulse: 76  Weight: 217 lb (98.4 kg)  Height: '5\' 7"'$  (1.702 m)    Body mass index is 33.99 kg/m. Wt Readings from Last 3 Encounters:  10/14/22 217 lb (98.4 kg)  10/06/22 218 lb (98.9 kg)  10/01/22 218 lb 3.2 oz (99 kg)   Patient is in no distress; well developed, nourished and groomed; neck is supple  EYES: Visual fields full to confrontation on the right, report vision loss with the left. Extraocular movements intacts   MUSCULOSKELETAL: Gait, strength, tone, movements noted in Neurologic exam below  NEUROLOGIC: MENTAL STATUS:     05/18/2022    3:33 PM  MMSE - Mini  Mental State Exam  Not completed: Unable to complete   awake, alert, oriented to person, place and time recent and remote memory intact normal attention and concentration language fluent, comprehension intact, naming intact fund of knowledge appropriate  CRANIAL NERVE:  2nd, 3rd, 4th, 6th - visual fields full to confrontation on the right, no vision on the left. extraocular muscles intact, no nystagmus 5th - facial sensation symmetric 7th - facial strength symmetric 8th - hearing intact 9th - palate elevates symmetrically, uvula midline 11th - shoulder shrug symmetric 12th - tongue protrusion midline  MOTOR:  normal bulk and tone, full strength in the BUE, BLE. RUE shoulder abduction is limited by pain.   SENSORY:  normal and symmetric to light touch, vibration  COORDINATION:  finger-nose-finger, fine finger movements normal  GAIT/STATION:  Normal, negative romberg      DIAGNOSTIC DATA (LABS, IMAGING, TESTING) - I reviewed patient records, labs, notes, testing and imaging myself where available.  Lab Results  Component Value Date   WBC 8.4 09/14/2022   HGB 13.2 09/14/2022   HCT 39.5 09/14/2022   MCV 96.1 09/14/2022   PLT 195 09/14/2022      Component Value Date/Time   NA 136 04/27/2022 1112   NA 138 01/20/2022 1433   K 3.6 04/27/2022 1112   K 4.3 07/12/2012 0755   CL 107 04/27/2022 1112   CL 100 07/12/2012 0755   CO2 22 04/27/2022 1112   GLUCOSE 130 (H) 04/27/2022 1112   BUN 14 04/27/2022 1112   BUN 13 01/20/2022 1433   CREATININE 1.10 (H) 04/27/2022 1112   CREATININE 1.23 (H) 12/11/2020 1023   CALCIUM 9.3 04/27/2022 1112   CALCIUM 11.0 07/12/2012 0755   PROT 7.3 04/27/2022 1112   PROT 6.8 09/08/2021 1514   PROT 8.1 07/12/2012 0755   ALBUMIN 4.1 04/27/2022 1112   ALBUMIN 4.5 09/08/2021 1514   AST 65 (H) 04/27/2022 1112  AST 42 07/12/2012 0755   ALT 36 04/27/2022 1112   ALKPHOS 33 (L) 04/27/2022 1112   ALKPHOS 32 07/12/2012 0755   BILITOT 1.4 (H)  04/27/2022 1112   BILITOT 0.7 09/08/2021 1514   BILITOT 1.0 07/12/2012 0755   GFRNONAA 54 (L) 04/27/2022 1112   GFRNONAA 36 (L) 07/22/2020 1441   GFRAA 41 (L) 07/22/2020 1441   Lab Results  Component Value Date   CHOL 205 (H) 01/20/2022   HDL 26 (L) 01/20/2022   LDLCALC 139 (H) 01/20/2022   TRIG 222 (H) 01/20/2022   CHOLHDL 7.9 (H) 01/20/2022   Lab Results  Component Value Date   HGBA1C 5.2 01/20/2022   Lab Results  Component Value Date   VITAMINB12 681 11/24/2016   Lab Results  Component Value Date   TSH 2.306 04/27/2022    MRI Brain 01/05/22 1. 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 2. 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  ASSESSMENT AND PLAN  72 y.o. year old female who is presenting for headache follow-up.  At last visit in March, we started her on Nurtec but it was listed as an allergy therefore not renewed. We have started her on Maxalt which has helped a lot with her headaches. She reports the medication works well and fast. Since September, she had a total of 5 headaches, all resolved with the Maxalt. No headaches for the past 2 months.    1. Migraine without aura and without status migrainosus, not intractable      Patient Instructions  Continue with Maxalt as needed, refill given  She will contact us if she needs additional Nurtec, at that time we will have to clearly note that patient is not allergic to Nurtec  Continue your other medications  Return in 6 months   No orders of the defined types were placed in this encounter.   Meds ordered this encounter  Medications   rizatriptan (MAXALT-MLT) 10 MG disintegrating tablet    Sig: Take 1 tablet (10 mg total) by mouth as needed for migraine. May repeat in 2 hours if needed    Dispense:  9 tablet    Refill:  6    Return in about 6 months (around 04/14/2023).  I have spent a total of 30 minutes dedicated to this patient  today, preparing to see patient, performing a medically appropriate examination and evaluation, ordering tests and/or medications and procedures, and counseling and educating the patient/family/caregiver; independently interpreting result and communicating results to the family/patient/caregiver; and documenting clinical information in the electronic medical record.    Alric Ran, MD 10/14/2022, 2:25 PM  [facility] [address] [place], [place] [address] [phone]

## 2022-10-15 DIAGNOSIS — M75102 Unspecified rotator cuff tear or rupture of left shoulder, not specified as traumatic: Secondary | ICD-10-CM | POA: Diagnosis not present

## 2022-10-15 DIAGNOSIS — M48061 Spinal stenosis, lumbar region without neurogenic claudication: Secondary | ICD-10-CM | POA: Diagnosis not present

## 2022-10-15 DIAGNOSIS — M75101 Unspecified rotator cuff tear or rupture of right shoulder, not specified as traumatic: Secondary | ICD-10-CM | POA: Diagnosis not present

## 2022-10-15 DIAGNOSIS — M1812 Unilateral primary osteoarthritis of first carpometacarpal joint, left hand: Secondary | ICD-10-CM | POA: Diagnosis not present

## 2022-10-15 DIAGNOSIS — M5137 Other intervertebral disc degeneration, lumbosacral region: Secondary | ICD-10-CM | POA: Diagnosis not present

## 2022-10-15 DIAGNOSIS — G8929 Other chronic pain: Secondary | ICD-10-CM | POA: Diagnosis not present

## 2022-10-20 ENCOUNTER — Ambulatory Visit: Payer: Medicare Other | Admitting: Orthopaedic Surgery

## 2022-10-20 ENCOUNTER — Telehealth: Payer: Self-pay | Admitting: Orthopaedic Surgery

## 2022-10-20 ENCOUNTER — Other Ambulatory Visit: Payer: Self-pay | Admitting: Neurology

## 2022-10-20 NOTE — Telephone Encounter (Signed)
Patient is sick vomiting, she rescheduled her appt to 10/27/22 at 9:10am w/Dr. Luna Glasgow.  She needs transportation w/[facility] [employer].

## 2022-10-20 NOTE — Telephone Encounter (Signed)
I emailed to cancel and also requested the resched date appt.

## 2022-10-21 NOTE — Telephone Encounter (Signed)
Patient is not allergic to Nurtec, this was entered erroneously and now removed from her allergy list. She is currently taking Maxalt and has tolerated very well, no adverse effects.

## 2022-10-26 DIAGNOSIS — M1812 Unilateral primary osteoarthritis of first carpometacarpal joint, left hand: Secondary | ICD-10-CM | POA: Diagnosis not present

## 2022-10-26 DIAGNOSIS — M5137 Other intervertebral disc degeneration, lumbosacral region: Secondary | ICD-10-CM | POA: Diagnosis not present

## 2022-10-26 DIAGNOSIS — M75102 Unspecified rotator cuff tear or rupture of left shoulder, not specified as traumatic: Secondary | ICD-10-CM | POA: Diagnosis not present

## 2022-10-26 DIAGNOSIS — G8929 Other chronic pain: Secondary | ICD-10-CM | POA: Diagnosis not present

## 2022-10-26 DIAGNOSIS — M48061 Spinal stenosis, lumbar region without neurogenic claudication: Secondary | ICD-10-CM | POA: Diagnosis not present

## 2022-10-26 DIAGNOSIS — M75101 Unspecified rotator cuff tear or rupture of right shoulder, not specified as traumatic: Secondary | ICD-10-CM | POA: Diagnosis not present

## 2022-10-27 ENCOUNTER — Ambulatory Visit: Payer: Medicare Other | Admitting: Internal Medicine

## 2022-10-27 ENCOUNTER — Ambulatory Visit (INDEPENDENT_AMBULATORY_CARE_PROVIDER_SITE_OTHER): Payer: Medicare Other | Admitting: Orthopaedic Surgery

## 2022-10-27 ENCOUNTER — Telehealth: Payer: Self-pay | Admitting: Orthopaedic Surgery

## 2022-10-27 ENCOUNTER — Encounter: Payer: Self-pay | Admitting: Orthopaedic Surgery

## 2022-10-27 VITALS — BP 123/64 | HR 84 | Ht 67.0 in | Wt 208.0 lb

## 2022-10-27 DIAGNOSIS — M25511 Pain in right shoulder: Secondary | ICD-10-CM | POA: Diagnosis not present

## 2022-10-27 DIAGNOSIS — G8929 Other chronic pain: Secondary | ICD-10-CM

## 2022-10-27 DIAGNOSIS — M25512 Pain in left shoulder: Secondary | ICD-10-CM

## 2022-10-27 NOTE — Patient Instructions (Addendum)
CONTINUE HOME EXERCISE PLAN PROVIDED BY HOME HEALTH PHYSICAL THERAPY  ROV 3 WEEKS SHOULDER PAIN BILATERAL  As the weather changes and gets cooler, you may notice you are affected more. You may have more pain in your joints. This is normal. Dress warmly and make sure that area is covered well.   Dr.Keeling is here all day on Tuesdays. He is here half a day on Wednesday mornings, and Thursday mornings. If you need anything such as a medication refill, please either call BEFORE the end of the day on [facility] or send a message through [facility]. Your pharmacy can send a refill request for you. Calling by the end of the day on [facility] allows [place] time to send Dr.Keeling the request and for him to respond before he leaves on Thursdays.

## 2022-10-27 NOTE — Progress Notes (Signed)
I am better.  Her left shoulder is much improved.  The right shoulder is tender but almost "there".  Her left hand is better.  She has had home PT and it has helped a lot.  She is taking her Naprosyn.  Left shoulder has full ROM.  Right shoulder has full ROM but pain the extremes.  The left hand is less painful and has less numbness. ROM is full.  Encounter Diagnoses  Name Primary?   Chronic pain in right shoulder Yes   Chronic pain in left shoulder    Continue the home PT for another week.  She can begin to decrease the naprosyn to one a day then stop if she is doing well.  Return in three weeks.  Call if any problem.  Precautions discussed.  Electronically Signed Sanjuana Kava, MD 1/23/20249:38 AM

## 2022-10-27 NOTE — Progress Notes (Unsigned)
LEFT VM FOR MICHELLE W/ [facility] ASKING IF THEY WILL EXTEND PATIENTS HHPT FOR AN ADDITIONAL WEEK PER PROVIDER REQUEST. NEW ORDER IS IN EPIC.

## 2022-10-27 NOTE — Telephone Encounter (Signed)
[name] said to let you know when is her next appointment.  She said you provide her transportation.   Her next appointment is 11/17/22 at 1:40

## 2022-10-28 ENCOUNTER — Other Ambulatory Visit (HOSPITAL_COMMUNITY)
Admission: RE | Admit: 2022-10-28 | Discharge: 2022-10-28 | Disposition: A | Discharge status: Home / Self Care | Payer: Medicare Other | Source: Ambulatory Visit | Attending: "Endocrinology | Admitting: "Endocrinology

## 2022-10-28 DIAGNOSIS — E039 Hypothyroidism, unspecified: Secondary | ICD-10-CM | POA: Diagnosis not present

## 2022-10-28 LAB — LIPID PANEL
Cholesterol: 189 mg/dL (ref 0–200)
HDL: 26 mg/dL — ABNORMAL LOW (ref >40)
LDL Cholesterol: 131 mg/dL — ABNORMAL HIGH (ref 0–99)
Total CHOL/HDL Ratio: 7.3 RATIO
Triglycerides: 160 mg/dL — ABNORMAL HIGH (ref <150)
VLDL: 32 mg/dL (ref 0–40)

## 2022-10-28 LAB — COMPREHENSIVE METABOLIC PANEL
ALT: 34 U/L (ref 0–44)
AST: 44 U/L — ABNORMAL HIGH (ref 15–41)
Albumin: 4 g/dL (ref 3.5–5.0)
Alkaline Phosphatase: 42 U/L (ref 38–126)
Anion gap: 9 (ref 5–15)
BUN: 17 mg/dL (ref 8–23)
CO2: 22 mmol/L (ref 22–32)
Calcium: 9.5 mg/dL (ref 8.9–10.3)
Chloride: 103 mmol/L (ref 98–111)
Creatinine, Ser: 1.24 mg/dL — ABNORMAL HIGH (ref 0.44–1.00)
GFR, Estimated: 47 mL/min — ABNORMAL LOW (ref >60)
Glucose, Bld: 117 mg/dL — ABNORMAL HIGH (ref 70–99)
Potassium: 3.8 mmol/L (ref 3.5–5.1)
Sodium: 134 mmol/L — ABNORMAL LOW (ref 135–145)
Total Bilirubin: 0.9 mg/dL (ref 0.3–1.2)
Total Protein: 7.3 g/dL (ref 6.5–8.1)

## 2022-10-28 LAB — MAGNESIUM: Magnesium: 1.7 mg/dL (ref 1.7–2.4)

## 2022-10-28 LAB — T4, FREE: Free T4: 1.19 ng/dL — ABNORMAL HIGH (ref 0.61–1.12)

## 2022-10-28 LAB — PHOSPHORUS: Phosphorus: 3.4 mg/dL (ref 2.5–4.6)

## 2022-10-28 LAB — TSH: TSH: 2.248 u[IU]/mL (ref 0.350–4.500)

## 2022-10-29 ENCOUNTER — Ambulatory Visit (INDEPENDENT_AMBULATORY_CARE_PROVIDER_SITE_OTHER): Payer: Medicare Other | Admitting: Family Medicine

## 2022-10-29 ENCOUNTER — Encounter: Payer: Self-pay | Admitting: Family Medicine

## 2022-10-29 ENCOUNTER — Ambulatory Visit (HOSPITAL_COMMUNITY): Payer: Medicare Other | Admitting: Occupational Therapy

## 2022-10-29 VITALS — BP 93/65 | HR 90 | Ht 67.0 in | Wt 213.1 lb

## 2022-10-29 DIAGNOSIS — N3000 Acute cystitis without hematuria: Secondary | ICD-10-CM

## 2022-10-29 DIAGNOSIS — N3090 Cystitis, unspecified without hematuria: Secondary | ICD-10-CM

## 2022-10-29 DIAGNOSIS — R350 Frequency of micturition: Secondary | ICD-10-CM | POA: Diagnosis not present

## 2022-10-29 LAB — POCT URINALYSIS DIP (CLINITEK)
Bilirubin, UA: NEGATIVE
Blood, UA: NEGATIVE
Glucose, UA: NEGATIVE mg/dL
Ketones, POC UA: NEGATIVE mg/dL
Nitrite, UA: NEGATIVE
POC PROTEIN,UA: 100 — AB
Spec Grav, UA: 1.025 (ref 1.010–1.025)
Urobilinogen, UA: 0.2 E.U./dL
pH, UA: 5 (ref 5.0–8.0)

## 2022-10-29 MED ORDER — NITROFURANTOIN MONOHYD MACRO 100 MG PO CAPS: 100.0000 mg | ORAL_CAPSULE | Freq: Two times a day (BID) | ORAL | 0 refills | Status: AC

## 2022-10-29 NOTE — Patient Instructions (Signed)
I appreciate the opportunity to provide care to you today!    Follow up: Dr. Posey Pronto as needed  Please complete the full course of the antibiotics   You can help prevent UTIs by doing the following:  -Avoid holding urine for prolonged periods; this stretches the bladder and causes bacteria to form because bacteria like warm and wet environments to grow -Empty the bladder as soon as the need arises.  -Empty your bladder soon after intercourse.  -Take showers instead of baths -Wipe front to back; doing so after urinating and after a bowel movement helps prevent bacteria in the anal region from spreading to the vagina and urethra. -Also, drink a full glass of water to help flush bacteria.      Please continue to a heart-healthy diet and increase your physical activities. Try to exercise for 26mns at least five times a week.      It was a pleasure to see you and I look forward to continuing to work together on your health and well-being. Please do not hesitate to call the office if you need care or have questions about your care.   Have a wonderful day and week. With Gratitude, GAlvira MondayMSN, FNP-BC

## 2022-10-29 NOTE — Progress Notes (Signed)
Acute Office Visit  Subjective:    Patient ID: [name], female    DOB: Jun 25, 1951, 72 y.o.   MRN: [id number]  Chief Complaint  Patient presents with   Urinary Tract Infection    Took an at home stick test showed positive for uti, onset of sx began 10/26/2021    HPI Patient is in today with complaints of urinary symptoms.  Reports frequent UTI. For the details of today's visit, please refer to the assessment and plan.     Past Medical History:  Diagnosis Date   Anal fissure    Anxiety    Arthritis    Asthma    C. difficile diarrhea 09/17/2021   Cataract    Phreesia 01/05/2021   Chest pain    a. 02/2000 Cath: nl cors, EF 60%;  b. 10/2010 MV: nl LV, no ischemia/infarct;  c. 03/2014 Admit c/p, r/o->grief from husbands death.   Chronic RUQ pain 2008/01/03   EUS slightly dilated CBD (7.30m), otherwise nl   Depression    Diverticulosis    Eczema    Exposure to chemical inhalation mid 1970s   Fatty liver    G6PD deficiency    Gallstones    GERD (gastroesophageal reflux disease)    Glaucoma    History of pulmonary embolus (PE)    Treated with Eliquis 203-31-16  HTN (hypertension)    Hypercholesteremia    a. intolerant to statins.   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 She has limited vision in the right eye.   Migraines    inner ocular   Ocular migraine    Palpitations    Pleurisy    Pneumonia 07/16/2021   Patient was exposed to a harsh chemical in the 1970's that created scar tissue in her lungs. She has had pneumonia several times in the past.   PONV (postoperative nausea and vomiting)    Pulmonary emboli (HCC)    Recurrent upper respiratory infection (URI)    Renal cyst    Retinal cyst    Sickle cell trait (HCC)    Tubular adenoma of colon 09/17/2011   Urticaria     Past Surgical History:  Procedure Laterality Date   ABDOMINAL HYSTERECTOMY     ADENOIDECTOMY      ANGIOPLASTY     APPENDECTOMY     BIOPSY N/A 03/14/2013   Procedure: SMALL BOWEL AND GASTRIC BIOPSIES (Procedure #1);  Surgeon: SDanie Binder MD;  Location: [facility];  Service: Endoscopy;  Laterality: N/A;   CARDIAC CATHETERIZATION Left 02/11/2000   CATARACT EXTRACTION Left    CATARACT EXTRACTION W/PHACO Right 03/19/2021   Procedure: CATARACT EXTRACTION PHACO AND INTRAOCULAR LENS PLACEMENT (IH[facility]oper RIGHT 6.75 00:50.4;  Surgeon: BLeandrew Koyanagi MD;  [facility]  Service: Ophthalmology;  Laterality: Right;   CHOLECYSTECTOMY  0Mar 31, 2009  COLONOSCOPY  12/22/2010   RWNU:UVOZDG cecal adenomatous polyp   COLONOSCOPY WITH PROPOFOL N/A 03/31/2016   Dr. FOneida Alar four sessile polyps rectum/sigmoid colon, diverticulosi, ext/int hemorrhoids. hyperplastic polyps, next tcs 5 years.   COLONOSCOPY WITH PROPOFOL N/A 04/21/2021   anal fissure, non-bleeding internal hemorrhoids, right colon diverticulosis, one 2 mm polyp in ascending, three 4-6 mm polyps in descending colon. Tubular adenomas. 5 year surveillance.   complete hysterectomy     ENTEROSCOPY N/A 03/14/2013   SUYQ:IHKVgastritis/ulcers has healed   ESOPHAGOGASTRODUODENOSCOPY  09/22/2011  QQP:YPPJ gastritis/Duodenitis   EXCISIONAL HEMORRHOIDECTOMY     FLEXIBLE SIGMOIDOSCOPY N/A 03/14/2013   SLF:3 colon polyp removed/moderate sized internal hemorrhoids   FLEXIBLE SIGMOIDOSCOPY N/A 06/09/2016   Procedure: FLEXIBLE SIGMOIDOSCOPY;  Surgeon: Danie Binder, MD;  [facility]: [facility];  Service: Endoscopy;  Laterality: N/A;  rectal polyps times 2   FLEXIBLE SIGMOIDOSCOPY N/A 03/18/2018   Procedure: FLEXIBLE SIGMOIDOSCOPY;  Surgeon: Danie Binder, MD;  [facility];  Service: Endoscopy;  Laterality: N/A;  9:15am   FOOT SURGERY     bunion removal left   GIVENS CAPSULE STUDY N/A 02/10/2013   Procedure: GIVENS CAPSULE STUDY;  Surgeon: Danie Binder, MD;  [facility]: [facility];  Service: Endoscopy;  Laterality: N/A;  [place]     right    HEMORRHOID BANDING N/A 03/14/2013   Procedure: HEMORRHOID BANDING (Procedure #3)  3 bands applied [id number] Exp 02/02/2014 ;  Surgeon: Danie Binder, MD;  Location: [facility];  Service: Endoscopy;  Laterality: N/A;   HEMORRHOID SURGERY N/A 04/24/2016   Procedure: EXTENSIVE HEMORRHOIDECTOMY;  Surgeon: Vickie Epley, MD;  Location: [facility];  Service: General;  Laterality: N/A;   LAPAROSCOPY     adhesions   POLYPECTOMY N/A 03/14/2013   KDX:IPJA Gastritis . ULCERS SEEN ON MAY 6 HAVE HEALED   POLYPECTOMY  03/31/2016   Procedure: POLYPECTOMY;  Surgeon: Danie Binder, MD;  Location: [facility];  Service: Endoscopy;;  sigmoid colon polyps x2, rectal polyps x2   POLYPECTOMY  04/21/2021   Procedure: POLYPECTOMY;  Surgeon: Eloise Harman, DO;  Location: [facility];  Service: Endoscopy;;   RECTAL EXAM UNDER ANESTHESIA N/A 07/23/2021   Procedure: RECTAL EXAM UNDER ANESTHESIA;  Surgeon: Leighton Ruff, MD;  [facility];  Service: General;  Laterality: N/A;   SPHINCTEROTOMY N/A 07/23/2021   Procedure: CHEMICAL SPHINCTEROTOMY;  Surgeon: Leighton Ruff, MD;  Location: [facility];  Service: General;  Laterality: N/A;   TONSILLECTOMY     TUBAL LIGATION      Family History  Problem Relation Age of Onset   Colon cancer Mother        74s   Urticaria Mother    Heart disease Mother    Colon polyps Mother    Cancer Father        oral   Crohn's disease Sister    Cancer Sister        around heart   Colon polyps Sister    Diabetes Brother    Other Brother        intestinal sugery   Colon polyps Brother    Colon cancer Maternal Grandmother    Colon cancer Maternal Grandfather    Colon cancer Paternal Grandfather    Anesthesia problems Neg Hx     Social History   Socioeconomic History   Marital status: Widowed    Spouse name: Not on file   Number of children: 1   Years of education: Not on file   Highest  education level: Not on file  Occupational History   Occupation: homemaker    Employer: UNEMPLOYED  Tobacco Use   Smoking status: Former    Packs/day: 0.50    Years: 30.00    Total pack years: 15.00    Types: Cigarettes    Quit date: 10/15/1996    Years since quitting: 26.0   Smokeless tobacco: Never   Tobacco comments:    Stopped smoking ~ 2000  Vaping Use   Vaping Use: Never used  Substance and Sexual Activity   Alcohol use: Yes    Alcohol/week: 4.0 standard drinks of alcohol    Types: 4 Glasses of wine per week    Comment: 1 drinks every couple of weeks   Drug use: No   Sexual activity: Yes    Birth control/protection: Surgical  Other Topics Concern   Not on file  Social History Narrative   Does not routinely exercise. Husband passed in JUN 2015 due to prostate ca.   Social Determinants of Health   Financial Resource Strain: Low Risk  (05/18/2022)   Overall Financial Resource Strain (CARDIA)    Difficulty of Paying Living Expenses: Not hard at all  Food Insecurity: No Food Insecurity (05/18/2022)   Hunger Vital Sign    Worried About Running Out of Food in the Last Year: Never true    Ran Out of Food in the Last Year: Never true  Transportation Needs: Unmet Transportation Needs (08/13/2022)   PRAPARE - Hydrologist (Medical): Yes    Lack of Transportation (Non-Medical): Yes  Physical Activity: Insufficiently Active (05/18/2022)   Exercise Vital Sign    Days of Exercise per Week: 3 days    Minutes of Exercise per Session: 20 min  Stress: No Stress Concern Present (05/18/2022)   [place] of Stress : Not at all  Social Connections: Socially Isolated (05/18/2022)   Social Connection and Isolation Panel [NHANES]    Frequency of Communication with Friends and Family: Once a week    Frequency of Social Gatherings with Friends and Family: Once a week    Attends Religious  Services: Never    Marine scientist or Organizations: No    Attends Archivist Meetings: Never    Marital Status: Widowed  Intimate Partner Violence: Not At Risk (05/18/2022)   Humiliation, Afraid, Rape, and Kick questionnaire    Fear of Current or Ex-Partner: No    Emotionally Abused: No    Physically Abused: No    Sexually Abused: No    Outpatient Medications Prior to Visit  Medication Sig Dispense Refill   albuterol (PROVENTIL) (2.5 MG/3ML) 0.083% nebulizer solution Take 3 mLs (2.5 mg total) by nebulization every 4 (four) hours as needed for wheezing or shortness of breath. 150 mL 3   albuterol (VENTOLIN HFA) 108 (90 Base) MCG/ACT inhaler Inhale 2 puffs into the lungs every 4 (four) hours as needed for wheezing. 54 g 3   ALPRAZolam (XANAX) 1 MG tablet Take 1 tablet (1 mg total) by mouth 2 (two) times daily as needed for anxiety. (Patient taking differently: Take 0.5-1 mg by mouth at bedtime.) 180 tablet 3   baclofen (LIORESAL) 10 MG tablet Take 1 tablet (10 mg total) by mouth 2 (two) times daily. 30 each 0   brimonidine (ALPHAGAN) 0.2 % ophthalmic solution Place 1 drop into both eyes 2 (two) times daily. 15 mL 3   budesonide-formoterol (SYMBICORT) 160-4.5 MCG/ACT inhaler Inhale 2 puffs into the lungs as needed. 10.2 g 3   Cholecalciferol (VITAMIN D3) 50 MCG (2000 UT) TABS Take 2,000 Units by mouth daily.     dexlansoprazole (DEXILANT) 60 MG capsule Take 1 capsule (60 mg total) by mouth daily. 90 capsule 3   diclofenac Sodium (VOLTAREN) 1 % GEL Apply 4 g topically 4 (four) times daily. 100 g 2   dicyclomine (BENTYL) 10 MG  capsule Take one capsule before meals and at bedtime AS NEEDED for abdominal pain and frequent stool. HOLD for constipation (Patient taking differently: Take 10 mg by mouth daily as needed (IBS).) 120 capsule 1   DUREZOL 0.05 % EMUL Place 1 drop into the right eye daily.     estradiol (ESTRACE VAGINAL) 0.1 MG/GM vaginal cream Place 1 Applicatorful vaginally  daily as needed. 42.5 g 3   fenofibrate (TRICOR) 145 MG tablet Take 1 tablet (145 mg total) by mouth daily. 90 tablet 3   hyoscyamine (ANASPAZ) 0.125 MG TBDP disintergrating tablet Place 1 tablet (0.125 mg total) under the tongue daily as needed (IBS). Place 0.125 mg under the tongue daily as needed (IBS). 30 tablet 0   irbesartan (AVAPRO) 150 MG tablet Take 0.5 tablets (75 mg total) by mouth daily. 45 tablet 3   latanoprost (XALATAN) 0.005 % ophthalmic solution Place 1 drop into both eyes at bedtime. 9 mL 3   levothyroxine (SYNTHROID) 25 MCG tablet Take 1 tablet (25 mcg total) by mouth daily before breakfast. 95 tablet 1   Lidocaine-Hydrocortisone Ace 3-2.5 % KIT APPLY TO RECTUM QID for 2 weeks then AS NEEDED FOR RECTAL PAIN OR BLEEDING 1 kit 11   Lifitegrast (XIIDRA) 5 % SOLN INSTILL 1 DROP IN BOTH EYES EVERY DAY AS NEEDED (EACH CONTAINER SHOULD YIELD 2 DROPS) STORE IN ORIGINAL FOIL POUCH (Patient taking differently: Place 1 drop into both eyes daily. (EACH CONTAINER SHOULD YIELD 2 DROPS) STORE IN ORIGINAL FOIL POUCH) 60 each 3   Magnesium 500 MG TABS Take 1 tablet by mouth daily.     meclizine (ANTIVERT) 12.5 MG tablet Take 1 tablet (12.5 mg total) by mouth 3 (three) times daily as needed for dizziness. (Patient taking differently: Take 12.5 mg by mouth 3 (three) times daily as needed (vertigo).) 30 tablet 3   Menthol, Topical Analgesic, (BIOFREEZE EX) Apply 1 application topically daily as needed (pain).     metroNIDAZOLE (METROCREAM) 0.75 % cream Apply 1 application. topically as needed. 45 g 3   naproxen (NAPROSYN) 500 MG tablet Take 1 tablet (500 mg total) by mouth 2 (two) times daily with a meal. 60 tablet 5   neomycin-polymyxin b-dexamethasone (MAXITROL) 3.5-10000-0.1 OINT Place 1 application into both eyes at bedtime as needed. (Patient taking differently: Place 1 application  into both eyes at bedtime as needed (Dry eyes).) 10.5 g 3   nitroGLYCERIN (NITROLINGUAL) 0.4 MG/SPRAY spray Place 1  spray under the tongue every 5 (five) minutes as needed for chest pain. 12 g 3   NON FORMULARY Take 1 tablet by mouth daily as needed (Stomach Bloat). FD GUARD     ondansetron (ZOFRAN) 4 MG tablet Take 1 tablet (4 mg total) by mouth every 8 (eight) hours as needed for nausea or vomiting. 30 tablet 1   prednisoLONE acetate (PRED FORTE) 1 % ophthalmic suspension Place 1 drop into the right eye 3 (three) times daily.     Probiotic Product (PROBIOTIC DAILY PO) Take 1 capsule by mouth daily. VSL #3     Rimegepant Sulfate (NURTEC) 75 MG TBDP Take 1 tablet by mouth as needed.     rizatriptan (MAXALT-MLT) 10 MG disintegrating tablet Take 1 tablet (10 mg total) by mouth as needed for migraine. May repeat in 2 hours if needed. (ok to send out this medication to the patient, the patient can tolerate this medication and will be monitored, if this is not acceptable please cancel this script and send in a new erx  with a note, thanks) 9 tablet 6   UNABLE TO FIND Place 1 application rectally daily as needed. [place] Name: lidocaine 5% pramoxine 1% ointment per rectum as needed.     zolpidem (AMBIEN) 10 MG tablet Take 1 tablet (10 mg total) by mouth at bedtime as needed for sleep. 90 tablet 1   No facility-administered medications prior to visit.    Allergies  Allergen Reactions   Nutmeg Oil (Myristica Oil) Anaphylaxis    Nut oil- skin irritation. Nut oil- skin irritation.   Adhesive [Tape] Other (See Comments)    Blisters skin   Aspirin Other (See Comments)    sickle cell trait--  Not recommended unless emergency   Imitrex [Sumatriptan] Hives   Keflex [Cephalexin] Other (See Comments)    G6PD   Levaquin [Levofloxacin In D5w]     Altered mental status Affects G6PD   Other     Nut Oil- skin irritation    Statins Other (See Comments)    Myopathy - elevated CK Myopathy - elevated CK Myopathy-elevated CK   Sulfa Antibiotics Other (See Comments)    Patient has sickle cell  trait Sickle cell trait.   Sulfonamide Derivatives Other (See Comments)    Patient has sickle cell trait   Latex Rash    Review of Systems  Constitutional:  Negative for chills and fever.  Eyes:  Negative for visual disturbance.  Respiratory:  Negative for chest tightness and shortness of breath.   Genitourinary:  Positive for dysuria, frequency and urgency.  Neurological:  Negative for dizziness and headaches.       Objective:    Physical Exam HENT:     Head: Normocephalic.     Mouth/Throat:     Mouth: Mucous membranes are moist.  Cardiovascular:     Rate and Rhythm: Normal rate.     Heart sounds: Normal heart sounds.  Pulmonary:     Effort: Pulmonary effort is normal.     Breath sounds: Normal breath sounds.  Abdominal:     Tenderness: There is abdominal tenderness in the suprapubic area. There is right CVA tenderness.  Neurological:     Mental Status: She is alert.     BP 93/65   Pulse 90   Ht '5\' 7"'$  (1.702 m)   Wt 213 lb 1.3 oz (96.7 kg)   SpO2 93%   BMI 33.37 kg/m  Wt Readings from Last 3 Encounters:  10/29/22 213 lb 1.3 oz (96.7 kg)  10/27/22 208 lb (94.3 kg)  10/14/22 217 lb (98.4 kg)       Assessment & Plan:  Acute cystitis without hematuria Assessment & Plan: Onset of symptoms since 10/26/2021 Complains of dysuria, urgency and frequency Denies fever and chills Complains of low back pain and abdominal pain UA shows a trace of leukocytes We will treat patient with Macrobid 100 mg BID for 5 days Educational to follow Please complete the full course of the antibiotics   You can help prevent UTIs by doing the following:  -Avoid holding urine for prolonged periods; this stretches the bladder and causes bacteria to form because bacteria like warm and wet environments to grow -Empty the bladder as soon as the need arises.  -Empty your bladder soon after intercourse.  -Take showers instead of baths -Wipe front to back; doing so after urinating and  after a bowel movement helps prevent bacteria in the anal region from spreading to the vagina and urethra. -Also, drink a full glass of water to  help flush bacteria.    Urinary frequency -     POCT URINALYSIS DIP (CLINITEK)  Cystitis -     Nitrofurantoin Monohyd Macro; Take 1 capsule (100 mg total) by mouth 2 (two) times daily for 5 days.  Dispense: 10 capsule; Refill: 0    Alvira Monday, FNP

## 2022-10-30 DIAGNOSIS — H401132 Primary open-angle glaucoma, bilateral, moderate stage: Secondary | ICD-10-CM | POA: Diagnosis not present

## 2022-10-30 DIAGNOSIS — H4720 Unspecified optic atrophy: Secondary | ICD-10-CM | POA: Diagnosis not present

## 2022-10-30 DIAGNOSIS — Z961 Presence of intraocular lens: Secondary | ICD-10-CM | POA: Diagnosis not present

## 2022-10-30 DIAGNOSIS — N3000 Acute cystitis without hematuria: Secondary | ICD-10-CM | POA: Insufficient documentation

## 2022-10-30 DIAGNOSIS — N390 Urinary tract infection, site not specified: Secondary | ICD-10-CM | POA: Insufficient documentation

## 2022-10-30 NOTE — Assessment & Plan Note (Signed)
Onset of symptoms since 10/26/2021 Complains of dysuria, urgency and frequency Denies fever and chills Complains of low back pain and abdominal pain UA shows a trace of leukocytes We will treat patient with Macrobid 100 mg BID for 5 days Educational to follow Please complete the full course of the antibiotics   You can help prevent UTIs by doing the following:  -Avoid holding urine for prolonged periods; this stretches the bladder and causes bacteria to form because bacteria like warm and wet environments to grow -Empty the bladder as soon as the need arises.  -Empty your bladder soon after intercourse.  -Take showers instead of baths -Wipe front to back; doing so after urinating and after a bowel movement helps prevent bacteria in the anal region from spreading to the vagina and urethra. -Also, drink a full glass of water to help flush bacteria.

## 2022-11-02 DIAGNOSIS — M75101 Unspecified rotator cuff tear or rupture of right shoulder, not specified as traumatic: Secondary | ICD-10-CM | POA: Diagnosis not present

## 2022-11-02 DIAGNOSIS — M48061 Spinal stenosis, lumbar region without neurogenic claudication: Secondary | ICD-10-CM | POA: Diagnosis not present

## 2022-11-02 DIAGNOSIS — M1812 Unilateral primary osteoarthritis of first carpometacarpal joint, left hand: Secondary | ICD-10-CM | POA: Diagnosis not present

## 2022-11-02 DIAGNOSIS — G8929 Other chronic pain: Secondary | ICD-10-CM | POA: Diagnosis not present

## 2022-11-02 DIAGNOSIS — M75102 Unspecified rotator cuff tear or rupture of left shoulder, not specified as traumatic: Secondary | ICD-10-CM | POA: Diagnosis not present

## 2022-11-02 DIAGNOSIS — M5137 Other intervertebral disc degeneration, lumbosacral region: Secondary | ICD-10-CM | POA: Diagnosis not present

## 2022-11-03 DIAGNOSIS — R32 Unspecified urinary incontinence: Secondary | ICD-10-CM | POA: Diagnosis not present

## 2022-11-03 DIAGNOSIS — M5137 Other intervertebral disc degeneration, lumbosacral region: Secondary | ICD-10-CM | POA: Diagnosis not present

## 2022-11-03 DIAGNOSIS — J45909 Unspecified asthma, uncomplicated: Secondary | ICD-10-CM | POA: Diagnosis not present

## 2022-11-03 DIAGNOSIS — K5790 Diverticulosis of intestine, part unspecified, without perforation or abscess without bleeding: Secondary | ICD-10-CM | POA: Diagnosis not present

## 2022-11-03 DIAGNOSIS — Z7952 Long term (current) use of systemic steroids: Secondary | ICD-10-CM | POA: Diagnosis not present

## 2022-11-03 DIAGNOSIS — M48061 Spinal stenosis, lumbar region without neurogenic claudication: Secondary | ICD-10-CM | POA: Diagnosis not present

## 2022-11-03 DIAGNOSIS — G8929 Other chronic pain: Secondary | ICD-10-CM | POA: Diagnosis not present

## 2022-11-03 DIAGNOSIS — K601 Chronic anal fissure: Secondary | ICD-10-CM | POA: Diagnosis not present

## 2022-11-03 DIAGNOSIS — F5104 Psychophysiologic insomnia: Secondary | ICD-10-CM | POA: Diagnosis not present

## 2022-11-03 DIAGNOSIS — H401130 Primary open-angle glaucoma, bilateral, stage unspecified: Secondary | ICD-10-CM | POA: Diagnosis not present

## 2022-11-03 DIAGNOSIS — K7581 Nonalcoholic steatohepatitis (NASH): Secondary | ICD-10-CM | POA: Diagnosis not present

## 2022-11-03 DIAGNOSIS — H548 Legal blindness, as defined in USA: Secondary | ICD-10-CM | POA: Diagnosis not present

## 2022-11-03 DIAGNOSIS — E7800 Pure hypercholesterolemia, unspecified: Secondary | ICD-10-CM | POA: Diagnosis not present

## 2022-11-03 DIAGNOSIS — G43709 Chronic migraine without aura, not intractable, without status migrainosus: Secondary | ICD-10-CM | POA: Diagnosis not present

## 2022-11-03 DIAGNOSIS — E669 Obesity, unspecified: Secondary | ICD-10-CM | POA: Diagnosis not present

## 2022-11-03 DIAGNOSIS — E039 Hypothyroidism, unspecified: Secondary | ICD-10-CM | POA: Diagnosis not present

## 2022-11-03 DIAGNOSIS — F4323 Adjustment disorder with mixed anxiety and depressed mood: Secondary | ICD-10-CM | POA: Diagnosis not present

## 2022-11-03 DIAGNOSIS — K219 Gastro-esophageal reflux disease without esophagitis: Secondary | ICD-10-CM | POA: Diagnosis not present

## 2022-11-03 DIAGNOSIS — M4602 Spinal enthesopathy, cervical region: Secondary | ICD-10-CM | POA: Diagnosis not present

## 2022-11-03 DIAGNOSIS — M1812 Unilateral primary osteoarthritis of first carpometacarpal joint, left hand: Secondary | ICD-10-CM | POA: Diagnosis not present

## 2022-11-03 DIAGNOSIS — M75101 Unspecified rotator cuff tear or rupture of right shoulder, not specified as traumatic: Secondary | ICD-10-CM | POA: Diagnosis not present

## 2022-11-03 DIAGNOSIS — I10 Essential (primary) hypertension: Secondary | ICD-10-CM | POA: Diagnosis not present

## 2022-11-03 DIAGNOSIS — M75102 Unspecified rotator cuff tear or rupture of left shoulder, not specified as traumatic: Secondary | ICD-10-CM | POA: Diagnosis not present

## 2022-11-03 DIAGNOSIS — H9190 Unspecified hearing loss, unspecified ear: Secondary | ICD-10-CM | POA: Diagnosis not present

## 2022-11-03 DIAGNOSIS — K589 Irritable bowel syndrome without diarrhea: Secondary | ICD-10-CM | POA: Diagnosis not present

## 2022-11-04 ENCOUNTER — Ambulatory Visit (INDEPENDENT_AMBULATORY_CARE_PROVIDER_SITE_OTHER): Payer: Medicare Other | Admitting: "Endocrinology

## 2022-11-04 ENCOUNTER — Encounter: Payer: Self-pay | Admitting: "Endocrinology

## 2022-11-04 VITALS — BP 116/68 | HR 88 | Ht 67.0 in | Wt 212.4 lb

## 2022-11-04 DIAGNOSIS — E039 Hypothyroidism, unspecified: Secondary | ICD-10-CM | POA: Diagnosis not present

## 2022-11-04 DIAGNOSIS — E782 Mixed hyperlipidemia: Secondary | ICD-10-CM

## 2022-11-04 NOTE — Progress Notes (Signed)
11/04/2022, 3:18 PM  Endocrinology follow-up note   Subjective:    Patient ID: [name], female    DOB: [birth year], PCP Lindell Spar, MD   Past Medical History:  Diagnosis Date   Anal fissure    Anxiety    Arthritis    Asthma    C. difficile diarrhea 09/17/2021   Cataract    Phreesia 01/05/2021   Chest pain    a. 02/2000 Cath: nl cors, EF 60%;  b. 10/2010 MV: nl LV, no ischemia/infarct;  c. 03/2014 Admit c/p, r/o->grief from husbands death.   Chronic RUQ pain 01-13-2008   EUS slightly dilated CBD (7.28m), otherwise nl   Depression    Diverticulosis    Eczema    Exposure to chemical inhalation mid 1970s   Fatty liver    G6PD deficiency    Gallstones    GERD (gastroesophageal reflux disease)    Glaucoma    History of pulmonary embolus (PE)    Treated with Eliquis 204-07-2015  HTN (hypertension)    Hypercholesteremia    a. intolerant to statins.   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 She has limited vision in the right eye.   Migraines    inner ocular   Ocular migraine    Palpitations    Pleurisy    Pneumonia 07/16/2021   Patient was exposed to a harsh chemical in the 1970's that created scar tissue in her lungs. She has had pneumonia several times in the past.   PONV (postoperative nausea and vomiting)    Pulmonary emboli (HCC)    Recurrent upper respiratory infection (URI)    Renal cyst    Retinal cyst    Sickle cell trait (HCC)    Tubular adenoma of colon 09/17/2011   Urticaria    Past Surgical History:  Procedure Laterality Date   ABDOMINAL HYSTERECTOMY     ADENOIDECTOMY     ANGIOPLASTY     APPENDECTOMY     BIOPSY N/A 03/14/2013   Procedure: SMALL BOWEL AND GASTRIC BIOPSIES (Procedure #1);  Surgeon: SDanie Binder MD;  Location: [facility];  Service: Endoscopy;  Laterality: N/A;   CARDIAC CATHETERIZATION Left  02/11/2000   CATARACT EXTRACTION Left    CATARACT EXTRACTION W/PHACO Right 03/19/2021   Procedure: CATARACT EXTRACTION PHACO AND INTRAOCULAR LENS PLACEMENT ([place] 6.75 00:50.4;  Surgeon: BLeandrew Koyanagi MD;  [facility]  Service: Ophthalmology;  Laterality: Right;   CHOLECYSTECTOMY  004-07-2008  COLONOSCOPY  12/22/2010   RLAG:TXMIWO cecal adenomatous polyp   COLONOSCOPY WITH PROPOFOL N/A 03/31/2016   Dr. FOneida Alar four sessile polyps rectum/sigmoid colon, diverticulosi, ext/int hemorrhoids. hyperplastic polyps, next tcs 5 years.   COLONOSCOPY WITH PROPOFOL N/A 04/21/2021   anal fissure, non-bleeding internal hemorrhoids, right colon diverticulosis, one 2 mm polyp in ascending, three 4-6 mm polyps in descending colon. Tubular adenomas. 5 year surveillance.   complete hysterectomy     ENTEROSCOPY N/A 03/14/2013   SEHO:ZYYQgastritis/ulcers has healed   ESOPHAGOGASTRODUODENOSCOPY  09/22/2011   SMGN:OIBBgastritis/Duodenitis   EXCISIONAL HEMORRHOIDECTOMY     FLEXIBLE  SIGMOIDOSCOPY N/A 03/14/2013   SLF:3 colon polyp removed/moderate sized internal hemorrhoids   FLEXIBLE SIGMOIDOSCOPY N/A 06/09/2016   Procedure: FLEXIBLE SIGMOIDOSCOPY;  Surgeon: Danie Binder, MD;  [facility];  Service: Endoscopy;  Laterality: N/A;  rectal polyps times 2   FLEXIBLE SIGMOIDOSCOPY N/A 03/18/2018   Procedure: FLEXIBLE SIGMOIDOSCOPY;  Surgeon: Danie Binder, MD;  [facility];  Service: Endoscopy;  Laterality: N/A;  9:15am   FOOT SURGERY     bunion removal left   GIVENS CAPSULE STUDY N/A 02/10/2013   Procedure: GIVENS CAPSULE STUDY;  Surgeon: Danie Binder, MD;  [facility];  Service: Endoscopy;  Laterality: N/A;  [place]     right    HEMORRHOID BANDING N/A 03/14/2013   Procedure: HEMORRHOID BANDING (Procedure #3)  3 bands applied [id number] Exp 02/02/2014 ;  Surgeon: Danie Binder, MD;  Location: [facility];  Service: Endoscopy;   Laterality: N/A;   HEMORRHOID SURGERY N/A 04/24/2016   Procedure: EXTENSIVE HEMORRHOIDECTOMY;  Surgeon: Vickie Epley, MD;  Location: [facility];  Service: General;  Laterality: N/A;   LAPAROSCOPY     adhesions   POLYPECTOMY N/A 03/14/2013   ESP:QZRA Gastritis . ULCERS SEEN ON MAY 6 HAVE HEALED   POLYPECTOMY  03/31/2016   Procedure: POLYPECTOMY;  Surgeon: Danie Binder, MD;  [facility];  Service: Endoscopy;;  sigmoid colon polyps x2, rectal polyps x2   POLYPECTOMY  04/21/2021   Procedure: POLYPECTOMY;  Surgeon: Eloise Harman, DO;  [facility];  Service: Endoscopy;;   RECTAL EXAM UNDER ANESTHESIA N/A 07/23/2021   Procedure: RECTAL EXAM UNDER ANESTHESIA;  Surgeon: Leighton Ruff, MD;  Location: [facility];  Service: General;  Laterality: N/A;   SPHINCTEROTOMY N/A 07/23/2021   Procedure: CHEMICAL SPHINCTEROTOMY;  Surgeon: Leighton Ruff, MD;  Location: [place];  Service: General;  Laterality: N/A;   TONSILLECTOMY     TUBAL LIGATION     Social History   Socioeconomic History   Marital status: Widowed    Spouse name: Not on file   Number of children: 1   Years of education: Not on file   Highest education level: Not on file  Occupational History   Occupation: homemaker    Employer: UNEMPLOYED  Tobacco Use   Smoking status: Former    Packs/day: 0.50    Years: 30.00    Total pack years: 15.00    Types: Cigarettes    Quit date: 10/15/1996    Years since quitting: 26.0   Smokeless tobacco: Never   Tobacco comments:    Stopped smoking ~ 2000  Vaping Use   Vaping Use: Never used  Substance and Sexual Activity   Alcohol use: Yes    Alcohol/week: 4.0 standard drinks of alcohol    Types: 4 Glasses of wine per week    Comment: 1 drinks every couple of weeks   Drug use: No   Sexual activity: Yes    Birth control/protection: Surgical  Other Topics Concern   Not on file  Social History Narrative   Does not routinely  exercise. Husband passed in JUN 2015 due to prostate ca.   Social Determinants of Health   Financial Resource Strain: Low Risk  (05/18/2022)   Overall Financial Resource Strain (CARDIA)    Difficulty of Paying Living Expenses: Not hard at all  Food Insecurity: No Food Insecurity (05/18/2022)   Hunger Vital Sign    Worried  About Running Out of Food in the Last Year: Never true    Ran Out of Food in the Last Year: Never true  Transportation Needs: Unmet Transportation Needs (08/13/2022)   PRAPARE - Transportation    Lack of Transportation (Medical): Yes    Lack of Transportation (Non-Medical): Yes  Physical Activity: Insufficiently Active (05/18/2022)   Exercise Vital Sign    Days of Exercise per Week: 3 days    Minutes of Exercise per Session: 20 min  Stress: No Stress Concern Present (05/18/2022)   [place]    Feeling of Stress : Not at all  Social Connections: Socially Isolated (05/18/2022)   Social Connection and Isolation Panel [NHANES]    Frequency of Communication with Friends and Family: Once a week    Frequency of Social Gatherings with Friends and Family: Once a week    Attends Religious Services: Never    Marine scientist or Organizations: No    Attends Archivist Meetings: Never    Marital Status: Widowed   Family History  Problem Relation Age of Onset   Colon cancer Mother        57s   Urticaria Mother    Heart disease Mother    Colon polyps Mother    Cancer Father        oral   Crohn's disease Sister    Cancer Sister        around heart   Colon polyps Sister    Diabetes Brother    Other Brother        intestinal sugery   Colon polyps Brother    Colon cancer Maternal Grandmother    Colon cancer Maternal Grandfather    Colon cancer Paternal Grandfather    Anesthesia problems Neg Hx    Outpatient Encounter Medications as of 11/04/2022  Medication Sig   albuterol (PROVENTIL)  (2.5 MG/3ML) 0.083% nebulizer solution Take 3 mLs (2.5 mg total) by nebulization every 4 (four) hours as needed for wheezing or shortness of breath.   albuterol (VENTOLIN HFA) 108 (90 Base) MCG/ACT inhaler Inhale 2 puffs into the lungs every 4 (four) hours as needed for wheezing.   ALPRAZolam (XANAX) 1 MG tablet Take 1 tablet (1 mg total) by mouth 2 (two) times daily as needed for anxiety. (Patient taking differently: Take 0.5-1 mg by mouth at bedtime.)   baclofen (LIORESAL) 10 MG tablet Take 1 tablet (10 mg total) by mouth 2 (two) times daily.   brimonidine (ALPHAGAN) 0.2 % ophthalmic solution Place 1 drop into both eyes 2 (two) times daily.   budesonide-formoterol (SYMBICORT) 160-4.5 MCG/ACT inhaler Inhale 2 puffs into the lungs as needed.   Cholecalciferol (VITAMIN D3) 50 MCG (2000 UT) TABS Take 2,000 Units by mouth daily.   dexlansoprazole (DEXILANT) 60 MG capsule Take 1 capsule (60 mg total) by mouth daily.   diclofenac Sodium (VOLTAREN) 1 % GEL Apply 4 g topically 4 (four) times daily.   dicyclomine (BENTYL) 10 MG capsule Take one capsule before meals and at bedtime AS NEEDED for abdominal pain and frequent stool. HOLD for constipation (Patient taking differently: Take 10 mg by mouth daily as needed (IBS).)   DUREZOL 0.05 % EMUL Place 1 drop into the right eye daily.   estradiol (ESTRACE VAGINAL) 0.1 MG/GM vaginal cream Place 1 Applicatorful vaginally daily as needed.   fenofibrate (TRICOR) 145 MG tablet Take 1 tablet (145 mg total) by mouth daily.   hyoscyamine (ANASPAZ) 0.125  MG TBDP disintergrating tablet Place 1 tablet (0.125 mg total) under the tongue daily as needed (IBS). Place 0.125 mg under the tongue daily as needed (IBS).   irbesartan (AVAPRO) 150 MG tablet Take 0.5 tablets (75 mg total) by mouth daily.   latanoprost (XALATAN) 0.005 % ophthalmic solution Place 1 drop into both eyes at bedtime.   Lidocaine-Hydrocortisone Ace 3-2.5 % KIT APPLY TO RECTUM QID for 2 weeks then AS NEEDED  FOR RECTAL PAIN OR BLEEDING   Lifitegrast (XIIDRA) 5 % SOLN INSTILL 1 DROP IN BOTH EYES EVERY DAY AS NEEDED (EACH CONTAINER SHOULD YIELD 2 DROPS) STORE IN ORIGINAL FOIL POUCH (Patient taking differently: Place 1 drop into both eyes daily. (EACH CONTAINER SHOULD YIELD 2 DROPS) STORE IN ORIGINAL FOIL POUCH)   Magnesium 500 MG TABS Take 1 tablet by mouth daily.   meclizine (ANTIVERT) 12.5 MG tablet Take 1 tablet (12.5 mg total) by mouth 3 (three) times daily as needed for dizziness. (Patient taking differently: Take 12.5 mg by mouth 3 (three) times daily as needed (vertigo).)   Menthol, Topical Analgesic, (BIOFREEZE EX) Apply 1 application topically daily as needed (pain).   metroNIDAZOLE (METROCREAM) 0.75 % cream Apply 1 application. topically as needed.   naproxen (NAPROSYN) 500 MG tablet Take 1 tablet (500 mg total) by mouth 2 (two) times daily with a meal.   neomycin-polymyxin b-dexamethasone (MAXITROL) 3.5-10000-0.1 OINT Place 1 application into both eyes at bedtime as needed. (Patient taking differently: Place 1 application  into both eyes at bedtime as needed (Dry eyes).)   nitroGLYCERIN (NITROLINGUAL) 0.4 MG/SPRAY spray Place 1 spray under the tongue every 5 (five) minutes as needed for chest pain.   NON FORMULARY Take 1 tablet by mouth daily as needed (Stomach Bloat). FD GUARD   ondansetron (ZOFRAN) 4 MG tablet Take 1 tablet (4 mg total) by mouth every 8 (eight) hours as needed for nausea or vomiting.   prednisoLONE acetate (PRED FORTE) 1 % ophthalmic suspension Place 1 drop into the right eye 3 (three) times daily.   Probiotic Product (PROBIOTIC DAILY PO) Take 1 capsule by mouth daily. VSL #3   Rimegepant Sulfate (NURTEC) 75 MG TBDP Take 1 tablet by mouth as needed.   rizatriptan (MAXALT-MLT) 10 MG disintegrating tablet Take 1 tablet (10 mg total) by mouth as needed for migraine. May repeat in 2 hours if needed. (ok to send out this medication to the patient, the patient can tolerate this  medication and will be monitored, if this is not acceptable please cancel this script and send in a new erx with a note, thanks)   UNABLE TO FIND Place 1 application rectally daily as needed. Alice Name: lidocaine 5% pramoxine 1% ointment per rectum as needed.   zolpidem (AMBIEN) 10 MG tablet Take 1 tablet (10 mg total) by mouth at bedtime as needed for sleep.   [DISCONTINUED] levothyroxine (SYNTHROID) 25 MCG tablet Take 1 tablet (25 mcg total) by mouth daily before breakfast.   No facility-administered encounter medications on file as of 11/04/2022.   ALLERGIES: Allergies  Allergen Reactions   Nutmeg Oil (Myristica Oil) Anaphylaxis    Nut oil- skin irritation. Nut oil- skin irritation.   Adhesive [Tape] Other (See Comments)    Blisters skin   Aspirin Other (See Comments)    sickle cell trait--  Not recommended unless emergency   Imitrex [Sumatriptan] Hives   Keflex [Cephalexin] Other (See Comments)    G6PD   Levaquin [Levofloxacin In D5w]  Altered mental status Affects G6PD   Other     Nut Oil- skin irritation    Statins Other (See Comments)    Myopathy - elevated CK Myopathy - elevated CK Myopathy-elevated CK   Sulfa Antibiotics Other (See Comments)    Patient has sickle cell trait Sickle cell trait.   Sulfonamide Derivatives Other (See Comments)    Patient has sickle cell trait   Latex Rash    VACCINATION STATUS: Immunization History  Administered Date(s) Administered   Fluad Quad(high Dose 65+) 06/05/2019, 07/22/2020, 06/16/2021, 06/30/2022   Influenza Split 07/12/2012   Influenza,inj,Quad PF,6+ Mos 08/03/2014, 07/19/2015, 08/11/2016, 06/24/2017, 07/04/2018   Moderna Covid-19 Vaccine Bivalent Booster 8yr & up 01/31/2021, 06/30/2021, 01/30/2022   Moderna SARS-COV2 Booster Vaccination 06/30/2022   Moderna Sars-Covid-2 Vaccination 11/16/2019, 12/15/2019, 08/02/2020   Pneumococcal Conjugate-13 02/15/2018   Pneumococcal Polysaccharide-23  08/03/2014, 01/12/2020   Rsv, Bivalent, Protein Subunit Rsvpref,pf (Evans Lance 06/30/2022   Tdap 08/25/2012, 08/03/2014   Zoster Recombinat (Shingrix) 01/16/2020, 03/21/2020   Zoster, Live 08/25/2012    HPI Joanne MATAYAis 72y.o. female who presents today with a medical history as above. she is being seen in follow-up  for mild  hypothyroidism on low-dose levothyroxine 25 mcg p.o. daily before breakfast.   Her previous mild hypercalcemia has resolved.  She is also on vitamin D deficiency.  She is  on regular supplement of magnesium [phone] mg po q day.   She continues to have stable calcium level after she was advised to discontinue hydrochlorothiazide.    She has no new complaints today.  She continues to feel better.  She denies any prior history of parathyroid/pituitary/adrenal dysfunction. -She does have a family history of some thyroid dysfunction in her cousins.   She is on multiple vitamin supplements/medications including inhalers for asthma.  Review of Systems Limited as above.  Objective:       11/04/2022    1:55 PM 10/29/2022   11:06 AM 10/27/2022    9:05 AM  Vitals with BMI  Height '5\' 7"'$  '5\' 7"'$  '5\' 7"'$   Weight 212 lbs 6 oz 213 lbs 1 oz 208 lbs  BMI 33.26 365.99335.70 Systolic 117793 1939 Diastolic 68 65 64  Pulse 88 90 84    BP 116/68   Pulse 88   Ht '5\' 7"'$  (1.702 m)   Wt 212 lb 6.4 oz (96.3 kg)   BMI 33.27 kg/m   Wt Readings from Last 3 Encounters:  11/04/22 212 lb 6.4 oz (96.3 kg)  10/29/22 213 lb 1.3 oz (96.7 kg)  10/27/22 208 lb (94.3 kg)    Physical Exam    CMP ( most recent) CMP     Component Value Date/Time   NA 134 (L) 10/28/2022 0856   NA 138 01/20/2022 1433   K 3.8 10/28/2022 0856   K 4.3 07/12/2012 0755   CL 103 10/28/2022 0856   CL 100 07/12/2012 0755   CO2 22 10/28/2022 0856   GLUCOSE 117 (H) 10/28/2022 0856   BUN 17 10/28/2022 0856   BUN 13 01/20/2022 1433   CREATININE 1.24 (H) 10/28/2022 0856   CREATININE 1.23 (H)  12/11/2020 1023   CALCIUM 9.5 10/28/2022 0856   CALCIUM 11.0 07/12/2012 0755   PROT 7.3 10/28/2022 0856   PROT 6.8 09/08/2021 1514   PROT 8.1 07/12/2012 0755   ALBUMIN 4.0 10/28/2022 0856   ALBUMIN 4.5 09/08/2021 1514   AST 44 (H) 10/28/2022 0856   AST 42 07/12/2012 0755  ALT 34 10/28/2022 0856   ALKPHOS 42 10/28/2022 0856   ALKPHOS 32 07/12/2012 0755   BILITOT 0.9 10/28/2022 0856   BILITOT 0.7 09/08/2021 1514   BILITOT 1.0 07/12/2012 0755   GFRNONAA 47 (L) 10/28/2022 0856   GFRNONAA 36 (L) 07/22/2020 1441   GFRAA 41 (L) 07/22/2020 1441     Diabetic Labs (most recent): Lab Results  Component Value Date   HGBA1C 5.2 01/20/2022   HGBA1C 5.1 12/11/2020   HGBA1C 5.4 06/14/2019   MICROALBUR 16.6 01/04/2017     Lipid Panel ( most recent) Lipid Panel     Component Value Date/Time   CHOL 189 10/28/2022 0857   CHOL 205 (H) 01/20/2022 1433   CHOL 254 07/12/2012 0755   TRIG 160 (H) 10/28/2022 0857   TRIG 325 07/12/2012 0755   HDL 26 (L) 10/28/2022 0857   HDL 26 (L) 01/20/2022 1433   CHOLHDL 7.3 10/28/2022 0857   VLDL 32 10/28/2022 0857   LDLCALC 131 (H) 10/28/2022 0857   LDLCALC 139 (H) 01/20/2022 1433   LDLCALC 156 (H) 12/11/2020 1023   LABVLDL 40 01/20/2022 1433      Lab Results  Component Value Date   TSH 2.248 10/28/2022   TSH 2.306 04/27/2022   TSH 2.200 09/08/2021   TSH 1.400 03/24/2021   TSH 1.770 09/23/2020   TSH 1.710 05/24/2020   TSH 5.12 (H) 01/12/2020   TSH 1.66 11/24/2016   TSH 3.900 07/29/2015   TSH 2.917 08/03/2014   FREET4 1.19 (H) 10/28/2022   FREET4 1.04 04/27/2022   FREET4 1.22 09/08/2021   FREET4 1.36 03/24/2021   FREET4 1.46 09/23/2020   FREET4 1.31 05/24/2020   FREET4 1.3 01/12/2020   FREET4 1.19 08/03/2014      Assessment & Plan:   1. Hypercalcemia  2.  Hypomagnesemia -She maintains normocalcemia  after her HCTZ was discontinued.    She will not need 24-hour urine calcium measurement for now.  Her current calcium level at 10 is  consistent with normocalcemia.  She is advised to resume and continue magnesium supplement at 500 mg p.o. daily at breakfast.  She has doubled her magnesium intermittently to treat muscle cramps, advised to do so occasionally.   3.  Hypothyroidism -Her previsit thyroid function tests are consistent with slight over-replacement.  She is advised to discontinue levothyroxine for now.    -Her neck exam is negative for goiter, no need for thyroid imaging for now. Her supraclavicular fullness is due to increased intrathoracic pressure due to asthma/COPD.   She has dyslipidemia, statin intolerance.  She is advised to continue her ezetimibe 10 mg p.o. daily at bedtime.  Her LDL is 160 increasing from 139.  For the majority of the interval, she was not following the whole food plant-based diet I recommended.  She may still need individualized whole food plant-based diet which can work with her IBS working with her GI providers.  If she cannot control her LDL, she will be considered for Repatha on subsequent visits.  - she is advised to maintain close follow up with Lindell Spar, MD for primary care needs.   I spent  21  minutes in the care of the patient today including review of labs from Thyroid Function, CMP, and other relevant labs ; imaging/biopsy records (current and previous including abstractions from other facilities); face-to-face time discussing  her lab results and symptoms, medications doses, her options of short and long term treatment based on the latest standards of care / guidelines;  and documenting the encounter.  Arieliz Latino Mink  participated in the discussions, expressed understanding, and voiced agreement with the above plans.  All questions were answered to her satisfaction. she is encouraged to contact clinic should she have any questions or concerns prior to her return visit.    Follow up plan: Return in about 6 months (around 05/05/2023) for Fasting Labs  in AM B4  8.   Glade Lloyd, MD [facility] [address] [place], [place] [address] Phone: [phone]  Fax: [phone]     11/04/2022, 3:18 PM  This note was partially dictated with voice recognition software. Similar sounding words can be transcribed inadequately or may not  be corrected upon review.

## 2022-11-06 DIAGNOSIS — G8929 Other chronic pain: Secondary | ICD-10-CM | POA: Diagnosis not present

## 2022-11-06 DIAGNOSIS — M75102 Unspecified rotator cuff tear or rupture of left shoulder, not specified as traumatic: Secondary | ICD-10-CM | POA: Diagnosis not present

## 2022-11-06 DIAGNOSIS — M48061 Spinal stenosis, lumbar region without neurogenic claudication: Secondary | ICD-10-CM | POA: Diagnosis not present

## 2022-11-06 DIAGNOSIS — M1812 Unilateral primary osteoarthritis of first carpometacarpal joint, left hand: Secondary | ICD-10-CM | POA: Diagnosis not present

## 2022-11-06 DIAGNOSIS — M75101 Unspecified rotator cuff tear or rupture of right shoulder, not specified as traumatic: Secondary | ICD-10-CM | POA: Diagnosis not present

## 2022-11-06 DIAGNOSIS — M5137 Other intervertebral disc degeneration, lumbosacral region: Secondary | ICD-10-CM | POA: Diagnosis not present

## 2022-11-09 DIAGNOSIS — G8929 Other chronic pain: Secondary | ICD-10-CM | POA: Diagnosis not present

## 2022-11-09 DIAGNOSIS — M5137 Other intervertebral disc degeneration, lumbosacral region: Secondary | ICD-10-CM | POA: Diagnosis not present

## 2022-11-09 DIAGNOSIS — M75101 Unspecified rotator cuff tear or rupture of right shoulder, not specified as traumatic: Secondary | ICD-10-CM | POA: Diagnosis not present

## 2022-11-09 DIAGNOSIS — M75102 Unspecified rotator cuff tear or rupture of left shoulder, not specified as traumatic: Secondary | ICD-10-CM | POA: Diagnosis not present

## 2022-11-09 DIAGNOSIS — M1812 Unilateral primary osteoarthritis of first carpometacarpal joint, left hand: Secondary | ICD-10-CM | POA: Diagnosis not present

## 2022-11-09 DIAGNOSIS — M48061 Spinal stenosis, lumbar region without neurogenic claudication: Secondary | ICD-10-CM | POA: Diagnosis not present

## 2022-11-11 DIAGNOSIS — M1812 Unilateral primary osteoarthritis of first carpometacarpal joint, left hand: Secondary | ICD-10-CM | POA: Diagnosis not present

## 2022-11-11 DIAGNOSIS — M48061 Spinal stenosis, lumbar region without neurogenic claudication: Secondary | ICD-10-CM | POA: Diagnosis not present

## 2022-11-11 DIAGNOSIS — M5137 Other intervertebral disc degeneration, lumbosacral region: Secondary | ICD-10-CM | POA: Diagnosis not present

## 2022-11-11 DIAGNOSIS — M75102 Unspecified rotator cuff tear or rupture of left shoulder, not specified as traumatic: Secondary | ICD-10-CM | POA: Diagnosis not present

## 2022-11-11 DIAGNOSIS — M75101 Unspecified rotator cuff tear or rupture of right shoulder, not specified as traumatic: Secondary | ICD-10-CM | POA: Diagnosis not present

## 2022-11-11 DIAGNOSIS — G8929 Other chronic pain: Secondary | ICD-10-CM | POA: Diagnosis not present

## 2022-11-12 ENCOUNTER — Telehealth: Payer: Self-pay | Admitting: Orthopaedic Surgery

## 2022-11-12 NOTE — Telephone Encounter (Signed)
Patient called lvm stating she has an appointment 11/17/22 with Dr. Luna Glasgow.  She is stating transportation still hasn't been booked, she stated that she called [employer] and she said it hasn't been done.  Pt's # [phone]

## 2022-11-16 DIAGNOSIS — M75102 Unspecified rotator cuff tear or rupture of left shoulder, not specified as traumatic: Secondary | ICD-10-CM | POA: Diagnosis not present

## 2022-11-16 DIAGNOSIS — M5137 Other intervertebral disc degeneration, lumbosacral region: Secondary | ICD-10-CM | POA: Diagnosis not present

## 2022-11-16 DIAGNOSIS — G8929 Other chronic pain: Secondary | ICD-10-CM | POA: Diagnosis not present

## 2022-11-16 DIAGNOSIS — M48061 Spinal stenosis, lumbar region without neurogenic claudication: Secondary | ICD-10-CM | POA: Diagnosis not present

## 2022-11-16 DIAGNOSIS — M75101 Unspecified rotator cuff tear or rupture of right shoulder, not specified as traumatic: Secondary | ICD-10-CM | POA: Diagnosis not present

## 2022-11-16 DIAGNOSIS — M1812 Unilateral primary osteoarthritis of first carpometacarpal joint, left hand: Secondary | ICD-10-CM | POA: Diagnosis not present

## 2022-11-17 ENCOUNTER — Ambulatory Visit (INDEPENDENT_AMBULATORY_CARE_PROVIDER_SITE_OTHER): Payer: Medicare Other | Admitting: Orthopaedic Surgery

## 2022-11-17 ENCOUNTER — Encounter: Payer: Self-pay | Admitting: Orthopaedic Surgery

## 2022-11-17 VITALS — BP 114/88 | HR 91

## 2022-11-17 DIAGNOSIS — G8929 Other chronic pain: Secondary | ICD-10-CM

## 2022-11-17 DIAGNOSIS — M25571 Pain in right ankle and joints of right foot: Secondary | ICD-10-CM | POA: Diagnosis not present

## 2022-11-17 NOTE — Progress Notes (Signed)
My foot is sore.  Her shoulders and hands are doing well. She is doing the exercises given by home PT.  Her lower legs have spasms often, at night and during the day.  This has been going on for years.  She takes Magnesium for this.  I have suggested also foods high in potassium such as dried apricots and avocado.  I have also suggested mustard.  She has used compression hose and that has helped.  Her right ankle is tender over anterior talofibular ligament but otherwise negative. She has full ROM.  NV intact.  Encounter Diagnosis  Name Primary?   Chronic pain of right ankle Yes   Do as above.  Return in six weeks.  Call if any problem.  Precautions discussed.  Electronically Signed Sanjuana Kava, MD 2/13/20242:50 PM

## 2022-11-18 ENCOUNTER — Other Ambulatory Visit: Payer: Self-pay

## 2022-11-18 ENCOUNTER — Telehealth: Payer: Self-pay | Admitting: Internal Medicine

## 2022-11-18 DIAGNOSIS — M75102 Unspecified rotator cuff tear or rupture of left shoulder, not specified as traumatic: Secondary | ICD-10-CM | POA: Diagnosis not present

## 2022-11-18 DIAGNOSIS — M75101 Unspecified rotator cuff tear or rupture of right shoulder, not specified as traumatic: Secondary | ICD-10-CM | POA: Diagnosis not present

## 2022-11-18 DIAGNOSIS — M48061 Spinal stenosis, lumbar region without neurogenic claudication: Secondary | ICD-10-CM | POA: Diagnosis not present

## 2022-11-18 DIAGNOSIS — M1812 Unilateral primary osteoarthritis of first carpometacarpal joint, left hand: Secondary | ICD-10-CM | POA: Diagnosis not present

## 2022-11-18 DIAGNOSIS — M5137 Other intervertebral disc degeneration, lumbosacral region: Secondary | ICD-10-CM | POA: Diagnosis not present

## 2022-11-18 DIAGNOSIS — G8929 Other chronic pain: Secondary | ICD-10-CM | POA: Diagnosis not present

## 2022-11-18 MED ORDER — NITROGLYCERIN 0.4 MG/SPRAY TL SOLN: 1.0000 | 3 refills | Status: DC | PRN

## 2022-11-18 NOTE — Telephone Encounter (Signed)
Refills sent to pharmacy. 

## 2022-11-18 NOTE — Telephone Encounter (Signed)
Patient needs refill on   nitroGLYCERIN (NITROLINGUAL) 0.4 MG/SPRAY spray   [facility] - [place], [place] - [address] [facility] [address] [place], [place] [address] Phone: [phone]  Fax: [phone]

## 2022-11-20 NOTE — Telephone Encounter (Signed)
No notes required.

## 2022-11-24 DIAGNOSIS — M48061 Spinal stenosis, lumbar region without neurogenic claudication: Secondary | ICD-10-CM | POA: Diagnosis not present

## 2022-11-24 DIAGNOSIS — M75102 Unspecified rotator cuff tear or rupture of left shoulder, not specified as traumatic: Secondary | ICD-10-CM | POA: Diagnosis not present

## 2022-11-24 DIAGNOSIS — G8929 Other chronic pain: Secondary | ICD-10-CM | POA: Diagnosis not present

## 2022-11-24 DIAGNOSIS — M75101 Unspecified rotator cuff tear or rupture of right shoulder, not specified as traumatic: Secondary | ICD-10-CM | POA: Diagnosis not present

## 2022-11-24 DIAGNOSIS — M5137 Other intervertebral disc degeneration, lumbosacral region: Secondary | ICD-10-CM | POA: Diagnosis not present

## 2022-11-24 DIAGNOSIS — M1812 Unilateral primary osteoarthritis of first carpometacarpal joint, left hand: Secondary | ICD-10-CM | POA: Diagnosis not present

## 2022-11-25 ENCOUNTER — Ambulatory Visit: Payer: Medicare Other | Admitting: Internal Medicine

## 2022-12-02 DIAGNOSIS — M75101 Unspecified rotator cuff tear or rupture of right shoulder, not specified as traumatic: Secondary | ICD-10-CM | POA: Diagnosis not present

## 2022-12-02 DIAGNOSIS — M75102 Unspecified rotator cuff tear or rupture of left shoulder, not specified as traumatic: Secondary | ICD-10-CM | POA: Diagnosis not present

## 2022-12-02 DIAGNOSIS — G8929 Other chronic pain: Secondary | ICD-10-CM | POA: Diagnosis not present

## 2022-12-02 DIAGNOSIS — M48061 Spinal stenosis, lumbar region without neurogenic claudication: Secondary | ICD-10-CM | POA: Diagnosis not present

## 2022-12-02 DIAGNOSIS — M5137 Other intervertebral disc degeneration, lumbosacral region: Secondary | ICD-10-CM | POA: Diagnosis not present

## 2022-12-02 DIAGNOSIS — M1812 Unilateral primary osteoarthritis of first carpometacarpal joint, left hand: Secondary | ICD-10-CM | POA: Diagnosis not present

## 2022-12-03 ENCOUNTER — Encounter: Payer: Self-pay | Admitting: Radiology

## 2022-12-23 ENCOUNTER — Other Ambulatory Visit: Payer: Self-pay

## 2022-12-23 ENCOUNTER — Telehealth: Payer: Self-pay | Admitting: Internal Medicine

## 2022-12-23 MED ORDER — IRBESARTAN 150 MG PO TABS: 75.0000 mg | ORAL_TABLET | Freq: Every day | ORAL | 3 refills | Status: DC

## 2022-12-23 NOTE — Telephone Encounter (Signed)
Patient called needs med refill before her next visit. Patient said something happening with computer over there running 2 days behind.  Patient asked to do 2 scripts to prevent from running out . Any questions can contact patent [phone].  irbesartan (AVAPRO) 150 MG tablet [id number]   Pharmacy  [facility] [place], [place] [address] [facility] [address] [place], [place] [address] Phone: [phone]  Fax: [phone]

## 2022-12-23 NOTE — Telephone Encounter (Signed)
Faxed to [facility]

## 2022-12-29 ENCOUNTER — Ambulatory Visit (INDEPENDENT_AMBULATORY_CARE_PROVIDER_SITE_OTHER): Payer: Medicare Other | Admitting: Orthopaedic Surgery

## 2022-12-29 ENCOUNTER — Encounter: Payer: Self-pay | Admitting: Orthopaedic Surgery

## 2022-12-29 VITALS — BP 126/71 | HR 93 | Ht 67.0 in | Wt 220.0 lb

## 2022-12-29 DIAGNOSIS — M2550 Pain in unspecified joint: Secondary | ICD-10-CM

## 2022-12-29 NOTE — Progress Notes (Signed)
I have pains in several joints but they are better.  She has varying multiple joint pains that vary with activity and the weather.  She has no new trauma, no numbness.  She is trying to find an anti-inflammatory diet that works for her.  She has sensitivity to various foods.  Overall, she has less joint pains.  She has normal gait today, slight tenderness of the right shoulder, no effusion, NV intact, normal ROM of the shoulders and neck.  Encounter Diagnosis  Name Primary?   Pain in joint, multiple sites Yes   She knows the activities to do and exercises to do.  She will try to find a diet that works for her.  I will see her as needed.  Call if any problem.  Precautions discussed.  Electronically Signed Sanjuana Kava, MD 3/26/20249:58 PM

## 2023-01-29 ENCOUNTER — Ambulatory Visit (INDEPENDENT_AMBULATORY_CARE_PROVIDER_SITE_OTHER): Payer: Medicare Other | Admitting: Internal Medicine

## 2023-01-29 ENCOUNTER — Encounter: Payer: Self-pay | Admitting: Internal Medicine

## 2023-01-29 VITALS — BP 118/62 | HR 86 | Ht 67.0 in | Wt 223.0 lb

## 2023-01-29 DIAGNOSIS — K760 Fatty (change of) liver, not elsewhere classified: Secondary | ICD-10-CM

## 2023-01-29 DIAGNOSIS — R7989 Other specified abnormal findings of blood chemistry: Secondary | ICD-10-CM

## 2023-01-29 DIAGNOSIS — K219 Gastro-esophageal reflux disease without esophagitis: Secondary | ICD-10-CM

## 2023-01-29 MED ORDER — DEXLANSOPRAZOLE 60 MG PO CPDR: 60.0000 mg | DELAYED_RELEASE_CAPSULE | Freq: Every day | ORAL | 3 refills | Status: DC
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
## 2023-01-29 NOTE — Patient Instructions (Addendum)
A referral was placed to [facility] they will call you to schedule an appointment.  Follow up in 1 year   We have sent the following medications to your pharmacy for you to pick up at your convenience: Dexilant  If your blood pressure at your visit was 140/90 or greater, please contact your primary care physician to follow up on this.  _______________________________________________________  If you are age 76 or older, your body mass index should be between 23-30. Your Body mass index is 34.93 kg/m. If this is out of the aforementioned range listed, please consider follow up with your Primary Care Provider.  If you are age 83 or younger, your body mass index should be between 19-25. Your Body mass index is 34.93 kg/m. If this is out of the aformentioned range listed, please consider follow up with your Primary Care Provider.   ________________________________________________________  The [facility] providers would like to encourage you to use [facility] to communicate with providers for non-urgent requests or questions.  Due to long hold times on the telephone, sending your provider a message by [facility] may be a faster and more efficient way to get a response.  Please allow 48 business hours for a response.  Please remember that this is for non-urgent requests.  _______________________________________________________    Thank you for entrusting me with your care and for choosing [facility], Dr. Eulah Pont

## 2023-02-01 ENCOUNTER — Ambulatory Visit (INDEPENDENT_AMBULATORY_CARE_PROVIDER_SITE_OTHER): Payer: Medicare Other | Admitting: Internal Medicine

## 2023-02-01 ENCOUNTER — Encounter: Payer: Self-pay | Admitting: Internal Medicine

## 2023-02-01 VITALS — BP 128/77 | HR 85 | Resp 94 | Ht 67.0 in | Wt 225.6 lb

## 2023-02-01 DIAGNOSIS — F5104 Psychophysiologic insomnia: Secondary | ICD-10-CM

## 2023-02-01 DIAGNOSIS — R7303 Prediabetes: Secondary | ICD-10-CM | POA: Diagnosis not present

## 2023-02-01 DIAGNOSIS — F419 Anxiety disorder, unspecified: Secondary | ICD-10-CM | POA: Diagnosis not present

## 2023-02-01 DIAGNOSIS — I10 Essential (primary) hypertension: Secondary | ICD-10-CM

## 2023-02-01 DIAGNOSIS — E782 Mixed hyperlipidemia: Secondary | ICD-10-CM | POA: Diagnosis not present

## 2023-02-01 DIAGNOSIS — E039 Hypothyroidism, unspecified: Secondary | ICD-10-CM | POA: Diagnosis not present

## 2023-02-01 MED ORDER — IRBESARTAN 150 MG PO TABS: 75.0000 mg | ORAL_TABLET | Freq: Every day | ORAL | 0 refills | Status: DC

## 2023-02-01 NOTE — Assessment & Plan Note (Signed)
  BP Readings from Last 1 Encounters:  02/01/23 128/77   Well-controlled with Irbesartan 75 mg QD Counseled for compliance with the medications Advised DASH diet and moderate exercise/walking as tolerated

## 2023-02-01 NOTE — Assessment & Plan Note (Signed)
BMI Readings from Last 3 Encounters:  02/01/23 35.33 kg/m  01/29/23 34.93 kg/m  12/29/22 34.46 kg/m   Associated with HTN, HLD, OA and GAD/MDD Advised to continue low carb diet

## 2023-02-01 NOTE — Assessment & Plan Note (Signed)
H/o panic disorder as well Well-controlled with Xanax Xanax 1 mg BID PRN, takes it only for panic episodes

## 2023-02-01 NOTE — Patient Instructions (Signed)
Please continue taking medications as prescribed.  Please continue to follow low salt diet and ambulate as tolerated. 

## 2023-02-01 NOTE — Assessment & Plan Note (Signed)
On fenofibrate ?Check lipid profile ?

## 2023-02-01 NOTE — Progress Notes (Signed)
Established Patient Office Visit  Subjective:  Patient ID: [name], female    DOB: [birth year]  Age: 72 y.o. MRN: [id number]  CC:  Chief Complaint  Patient presents with   Hypertension    Follow up. Patient is concerned for weight gain. She states she has a headache almost every day now    HPI Joanne PION is a 72 y.o. female with past medical history of HTN, GERD, IBS-C, NASH, anal fissure, hypothyroidism, OA, DDD of lumbar spine, anxiety/panic disorder, insomnia, left sided blind eye, G6PD mutation and sickle cell trait who presents for f/u of her chronic medical conditions.  HTN: BP is well-controlled. Takes medications regularly. Patient denies dizziness, chest pain, dyspnea or palpitations.  She has daily headache, which is generalized, and worse with stress. She has tried Tylenol for it with some relief. Denies any focal numbness or tingling.  She reports that her headache episode started when she was taken off of levothyroxine.  She has also noted weight gain since then.  She has Xanax for severe anxiety/panic episode, but does not require it frequently.  She denies anhedonia, SI or HI. She takes Ambien for insomnia.  She has very limited vision in the right eye after cataract surgery.  She has been having difficulty ambulating even at home, but denies any recent fall.      Past Medical History:  Diagnosis Date   Anal fissure    Anxiety    Arthritis    Asthma    C. difficile diarrhea 09/17/2021   Cataract    Phreesia 01/05/2021   Chest pain    a. 02/27/00 Cath: nl cors, EF 60%;  b. 10/2010 MV: nl LV, no ischemia/infarct;  c. 03/2014 Admit c/p, r/o->grief from husbands death.   Chronic RUQ pain Feb 27, 2008   EUS slightly dilated CBD (7.25mm), otherwise nl   Depression    Diverticulosis    Eczema    Exposure to chemical inhalation mid 1970s   Fatty liver    G6PD deficiency    Gallstones    GERD (gastroesophageal reflux disease)    Glaucoma    History of pulmonary  embolus (PE)    Treated with Eliquis Feb 27, 2015   HTN (hypertension)    Hypercholesteremia    a. intolerant to statins.   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 She has limited vision in the right eye.   Migraines    inner ocular   Ocular migraine    Palpitations    Pleurisy    Pneumonia 07/16/2021   Patient was exposed to a harsh chemical in the 1970's that created scar tissue in her lungs. She has had pneumonia several times in the past.   PONV (postoperative nausea and vomiting)    Pulmonary emboli (HCC)    Recurrent upper respiratory infection (URI)    Renal cyst    Retinal cyst    Sickle cell trait (HCC)    Tubular adenoma of colon 09/17/2011   Urticaria     Past Surgical History:  Procedure Laterality Date   ABDOMINAL HYSTERECTOMY     ADENOIDECTOMY     ANGIOPLASTY     APPENDECTOMY     BIOPSY N/A 03/14/2013   Procedure: SMALL BOWEL AND GASTRIC BIOPSIES (Procedure #1);  Surgeon: West Bali, MD;  Location: [facility];  Service: Endoscopy;  Laterality: N/A;   CARDIAC  CATHETERIZATION Left 02/11/2000   CATARACT EXTRACTION Left    CATARACT EXTRACTION W/PHACO Right 03/19/2021   Procedure: CATARACT EXTRACTION PHACO AND INTRAOCULAR LENS PLACEMENT (IOC) RIGHT 6.75 00:50.4;  Surgeon: Lockie Mola, MD;  Location: [facility];  Service: Ophthalmology;  Laterality: Right;   CHOLECYSTECTOMY  12/2007   COLONOSCOPY  12/22/2010   NWG:NFAOZH, cecal adenomatous polyp   COLONOSCOPY WITH PROPOFOL N/A 03/31/2016   Dr. Darrick Penna: four sessile polyps rectum/sigmoid colon, diverticulosi, ext/int hemorrhoids. hyperplastic polyps, next tcs 5 years.   COLONOSCOPY WITH PROPOFOL N/A 04/21/2021   anal fissure, non-bleeding internal hemorrhoids, right colon diverticulosis, one 2 mm polyp in ascending, three 4-6 mm polyps in descending colon. Tubular adenomas. 5 year surveillance.   complete  hysterectomy     ENTEROSCOPY N/A 03/14/2013   YQM:VHQI gastritis/ulcers has healed   ESOPHAGOGASTRODUODENOSCOPY  09/22/2011   ONG:EXBM gastritis/Duodenitis   EXCISIONAL HEMORRHOIDECTOMY     FLEXIBLE SIGMOIDOSCOPY N/A 03/14/2013   SLF:3 colon polyp removed/moderate sized internal hemorrhoids   FLEXIBLE SIGMOIDOSCOPY N/A 06/09/2016   Procedure: FLEXIBLE SIGMOIDOSCOPY;  Surgeon: West Bali, MD;  Location: [facility];  Service: Endoscopy;  Laterality: N/A;  rectal polyps times 2   FLEXIBLE SIGMOIDOSCOPY N/A 03/18/2018   Procedure: FLEXIBLE SIGMOIDOSCOPY;  Surgeon: West Bali, MD;  Location: [facility];  Service: Endoscopy;  Laterality: N/A;  9:15am   FOOT SURGERY     bunion removal left   GIVENS CAPSULE STUDY N/A 02/10/2013   Procedure: GIVENS CAPSULE STUDY;  Surgeon: West Bali, MD;  Location: [facility];  Service: Endoscopy;  Laterality: N/A;  730   HEEL SPUR EXCISION     right    HEMORRHOID BANDING N/A 03/14/2013   Procedure: HEMORRHOID BANDING (Procedure #3)  3 bands applied [id number] Exp 02/02/2014 ;  Surgeon: West Bali, MD;  Location: [facility];  Service: Endoscopy;  Laterality: N/A;   HEMORRHOID SURGERY N/A 04/24/2016   Procedure: EXTENSIVE HEMORRHOIDECTOMY;  Surgeon: Ancil Linsey, MD;  Location: [facility];  Service: General;  Laterality: N/A;   LAPAROSCOPY     adhesions   POLYPECTOMY N/A 03/14/2013   VOZ:DGUY Gastritis . ULCERS SEEN ON MAY 6 HAVE HEALED   POLYPECTOMY  03/31/2016   Procedure: POLYPECTOMY;  Surgeon: West Bali, MD;  Location: [facility];  Service: Endoscopy;;  sigmoid colon polyps x2, rectal polyps x2   POLYPECTOMY  04/21/2021   Procedure: POLYPECTOMY;  Surgeon: Lanelle Bal, DO;  Location: [facility];  Service: Endoscopy;;   RECTAL EXAM UNDER ANESTHESIA N/A 07/23/2021   Procedure: RECTAL EXAM UNDER ANESTHESIA;  Surgeon: Romie Levee, MD;  Location: [facility];  Service: General;  Laterality: N/A;    SPHINCTEROTOMY N/A 07/23/2021   Procedure: CHEMICAL SPHINCTEROTOMY;  Surgeon: Romie Levee, MD;  Location: [facility];  Service: General;  Laterality: N/A;   TONSILLECTOMY     TUBAL LIGATION      Family History  Problem Relation Age of Onset   Colon cancer Mother        58s   Urticaria Mother    Heart disease Mother    Colon polyps Mother    Cancer Father        oral   Crohn's disease Sister    Cancer Sister        around heart   Colon polyps Sister    Diabetes Brother    Other Brother        intestinal sugery   Colon  polyps Brother    Colon cancer Maternal Grandmother    Colon cancer Maternal Grandfather    Colon cancer Paternal Grandfather    Anesthesia problems Neg Hx     Social History   Socioeconomic History   Marital status: Widowed    Spouse name: Not on file   Number of children: 1   Years of education: Not on file   Highest education level: Not on file  Occupational History   Occupation: homemaker    Employer: UNEMPLOYED  Tobacco Use   Smoking status: Former    Packs/day: 0.50    Years: 30.00    Additional pack years: 0.00    Total pack years: 15.00    Types: Cigarettes    Quit date: 10/15/1996    Years since quitting: 26.3   Smokeless tobacco: Never   Tobacco comments:    Stopped smoking ~ 2000  Vaping Use   Vaping Use: Never used  Substance and Sexual Activity   Alcohol use: Yes    Alcohol/week: 4.0 standard drinks of alcohol    Types: 4 Glasses of wine per week    Comment: 1 drinks every couple of weeks   Drug use: No   Sexual activity: Yes    Birth control/protection: Surgical  Other Topics Concern   Not on file  Social History Narrative   Does not routinely exercise. Husband passed in JUN 2015 due to prostate ca.   Social Determinants of Health   Financial Resource Strain: Low Risk  (05/18/2022)   Overall Financial Resource Strain (CARDIA)    Difficulty of Paying Living Expenses: Not hard at all  Food Insecurity: No  Food Insecurity (05/18/2022)   Hunger Vital Sign    Worried About Running Out of Food in the Last Year: Never true    Ran Out of Food in the Last Year: Never true  Transportation Needs: Unmet Transportation Needs (08/13/2022)   PRAPARE - Administrator, Civil Service (Medical): Yes    Lack of Transportation (Non-Medical): Yes  Physical Activity: Insufficiently Active (05/18/2022)   Exercise Vital Sign    Days of Exercise per Week: 3 days    Minutes of Exercise per Session: 20 min  Stress: No Stress Concern Present (05/18/2022)   [employer] of Occupational Health - Occupational Stress Questionnaire    Feeling of Stress : Not at all  Social Connections: Socially Isolated (05/18/2022)   Social Connection and Isolation Panel [NHANES]    Frequency of Communication with Friends and Family: Once a week    Frequency of Social Gatherings with Friends and Family: Once a week    Attends Religious Services: Never    Database administrator or Organizations: No    Attends Banker Meetings: Never    Marital Status: Widowed  Intimate Partner Violence: Not At Risk (05/18/2022)   Humiliation, Afraid, Rape, and Kick questionnaire    Fear of Current or Ex-Partner: No    Emotionally Abused: No    Physically Abused: No    Sexually Abused: No    Outpatient Medications Prior to Visit  Medication Sig Dispense Refill   albuterol (PROVENTIL) (2.5 MG/3ML) 0.083% nebulizer solution Take 3 mLs (2.5 mg total) by nebulization every 4 (four) hours as needed for wheezing or shortness of breath. 150 mL 3   albuterol (VENTOLIN HFA) 108 (90 Base) MCG/ACT inhaler Inhale 2 puffs into the lungs every 4 (four) hours as needed for wheezing. 54 g 3   ALPRAZolam (XANAX) 1  MG tablet Take 1 tablet (1 mg total) by mouth 2 (two) times daily as needed for anxiety. (Patient taking differently: Take 0.5-1 mg by mouth at bedtime.) 180 tablet 3   brimonidine (ALPHAGAN) 0.2 % ophthalmic solution Place 1 drop  into both eyes 2 (two) times daily. 15 mL 3   budesonide-formoterol (SYMBICORT) 160-4.5 MCG/ACT inhaler Inhale 2 puffs into the lungs as needed. 10.2 g 3   Cholecalciferol (VITAMIN D3) 50 MCG (2000 UT) TABS Take 2,000 Units by mouth daily.     dexlansoprazole (DEXILANT) 60 MG capsule Take 1 capsule (60 mg total) by mouth daily. 90 capsule 3   diclofenac Sodium (VOLTAREN) 1 % GEL Apply 4 g topically 4 (four) times daily. 100 g 2   dicyclomine (BENTYL) 10 MG capsule Take one capsule before meals and at bedtime AS NEEDED for abdominal pain and frequent stool. HOLD for constipation (Patient taking differently: Take 10 mg by mouth daily as needed (IBS).) 120 capsule 1   DUREZOL 0.05 % EMUL Place 1 drop into the right eye daily.     estradiol (ESTRACE VAGINAL) 0.1 MG/GM vaginal cream Place 1 Applicatorful vaginally daily as needed. 42.5 g 3   fenofibrate (TRICOR) 145 MG tablet Take 1 tablet (145 mg total) by mouth daily. 90 tablet 3   hyoscyamine (ANASPAZ) 0.125 MG TBDP disintergrating tablet Place 1 tablet (0.125 mg total) under the tongue daily as needed (IBS). Place 0.125 mg under the tongue daily as needed (IBS). 30 tablet 0   latanoprost (XALATAN) 0.005 % ophthalmic solution Place 1 drop into both eyes at bedtime. 9 mL 3   Lidocaine-Hydrocortisone Ace 3-2.5 % KIT APPLY TO RECTUM QID for 2 weeks then AS NEEDED FOR RECTAL PAIN OR BLEEDING 1 kit 11   Lifitegrast (XIIDRA) 5 % SOLN INSTILL 1 DROP IN BOTH EYES EVERY DAY AS NEEDED (EACH CONTAINER SHOULD YIELD 2 DROPS) STORE IN ORIGINAL FOIL POUCH (Patient taking differently: Place 1 drop into both eyes daily. (EACH CONTAINER SHOULD YIELD 2 DROPS) STORE IN ORIGINAL FOIL POUCH) 60 each 3   Magnesium 500 MG TABS Take 1 tablet by mouth daily.     meclizine (ANTIVERT) 12.5 MG tablet Take 1 tablet (12.5 mg total) by mouth 3 (three) times daily as needed for dizziness. (Patient taking differently: Take 12.5 mg by mouth 3 (three) times daily as needed (vertigo).) 30  tablet 3   Menthol, Topical Analgesic, (BIOFREEZE EX) Apply 1 application topically daily as needed (pain).     metroNIDAZOLE (METROCREAM) 0.75 % cream Apply 1 application. topically as needed. 45 g 3   naproxen (NAPROSYN) 500 MG tablet Take 1 tablet (500 mg total) by mouth 2 (two) times daily with a meal. 60 tablet 5   neomycin-polymyxin b-dexamethasone (MAXITROL) 3.5-10000-0.1 OINT Place 1 application into both eyes at bedtime as needed. (Patient taking differently: Place 1 application  into both eyes at bedtime as needed (Dry eyes).) 10.5 g 3   nitroGLYCERIN (NITROLINGUAL) 0.4 MG/SPRAY spray Place 1 spray under the tongue every 5 (five) minutes as needed for chest pain. 12 g 3   NON FORMULARY Take 1 tablet by mouth daily as needed (Stomach Bloat). FD GUARD     ondansetron (ZOFRAN) 4 MG tablet Take 1 tablet (4 mg total) by mouth every 8 (eight) hours as needed for nausea or vomiting. 30 tablet 1   prednisoLONE acetate (PRED FORTE) 1 % ophthalmic suspension Place 1 drop into the right eye 3 (three) times daily.     Probiotic  Product (PROBIOTIC DAILY PO) Take 1 capsule by mouth daily. VSL #3     Rimegepant Sulfate (NURTEC) 75 MG TBDP Take 1 tablet by mouth as needed. (Patient not taking: Reported on 01/29/2023)     rizatriptan (MAXALT-MLT) 10 MG disintegrating tablet Take 1 tablet (10 mg total) by mouth as needed for migraine. May repeat in 2 hours if needed. (ok to send out this medication to the patient, the patient can tolerate this medication and will be monitored, if this is not acceptable please cancel this script and send in a new erx with a note, thanks) 9 tablet 6   UNABLE TO FIND Place 1 application rectally daily as needed. [facility]  Med Name: lidocaine 5% pramoxine 1% ointment per rectum as needed.     zolpidem (AMBIEN) 10 MG tablet Take 1 tablet (10 mg total) by mouth at bedtime as needed for sleep. 90 tablet 1   baclofen (LIORESAL) 10 MG tablet Take 1 tablet (10 mg total) by  mouth 2 (two) times daily. 30 each 0   irbesartan (AVAPRO) 150 MG tablet Take 0.5 tablets (75 mg total) by mouth daily. 45 tablet 3   No facility-administered medications prior to visit.    Allergies  Allergen Reactions   Nutmeg Oil (Myristica Oil) Anaphylaxis    Nut oil- skin irritation. Nut oil- skin irritation.   Adhesive [Tape] Other (See Comments)    Blisters skin   Aspirin Other (See Comments)    sickle cell trait--  Not recommended unless emergency   Imitrex [Sumatriptan] Hives   Keflex [Cephalexin] Other (See Comments)    G6PD   Levaquin [Levofloxacin In D5w]     Altered mental status Affects G6PD   Other     Nut Oil- skin irritation    Statins Other (See Comments)    Myopathy - elevated CK Myopathy - elevated CK Myopathy-elevated CK   Sulfa Antibiotics Other (See Comments)    Patient has sickle cell trait Sickle cell trait.   Sulfonamide Derivatives Other (See Comments)    Patient has sickle cell trait   Latex Rash    ROS Review of Systems  Constitutional:  Positive for unexpected weight change. Negative for chills and fever.  HENT:  Negative for congestion, sinus pressure, sinus pain and sore throat.   Eyes:  Positive for photophobia, pain and visual disturbance. Negative for discharge.  Respiratory:  Negative for cough, shortness of breath and wheezing.   Cardiovascular:  Negative for chest pain and palpitations.  Gastrointestinal:  Positive for constipation. Negative for abdominal pain, blood in stool, diarrhea, nausea and vomiting.  Endocrine: Negative for polydipsia and polyuria.  Genitourinary:  Negative for dysuria and hematuria.  Musculoskeletal:  Positive for arthralgias, back pain, neck pain and neck stiffness.  Skin:  Negative for rash.  Neurological:  Positive for headaches. Negative for dizziness and weakness.  Psychiatric/Behavioral:  Negative for agitation and behavioral problems.       Objective:    Physical Exam Vitals reviewed.   Constitutional:      General: She is not in acute distress.    Appearance: She is not diaphoretic.  HENT:     Head: Normocephalic and atraumatic.     Nose: Nose normal.     Mouth/Throat:     Mouth: Mucous membranes are moist.  Eyes:     General: No scleral icterus.    Extraocular Movements: Extraocular movements intact.  Cardiovascular:     Rate and Rhythm: Normal rate and regular rhythm.  Pulses: Normal pulses.     Heart sounds: Normal heart sounds. No murmur heard. Pulmonary:     Breath sounds: Normal breath sounds. No wheezing or rales.  Musculoskeletal:        General: Tenderness (Lower lumbar spine area) present.     Left hand: Decreased strength of thumb/finger opposition.     Cervical back: Neck supple. No tenderness.     Right lower leg: No edema.     Left lower leg: No edema.  Skin:    General: Skin is warm.     Findings: No rash.  Neurological:     General: No focal deficit present.     Mental Status: She is alert and oriented to person, place, and time.     Sensory: No sensory deficit.     Motor: No weakness.  Psychiatric:        Mood and Affect: Mood normal.        Behavior: Behavior normal.     BP 128/77 (BP Location: Right Arm, Patient Position: Sitting, Cuff Size: Large)   Pulse 85   Resp (!) 94   Ht 5\' 7"  (1.702 m)   Wt 225 lb 9.6 oz (102.3 kg)   BMI 35.33 kg/m  Wt Readings from Last 3 Encounters:  02/01/23 225 lb 9.6 oz (102.3 kg)  01/29/23 223 lb (101.2 kg)  12/29/22 220 lb (99.8 kg)    Lab Results  Component Value Date   TSH 4.580 (H) 02/01/2023   Lab Results  Component Value Date   WBC 8.4 09/14/2022   HGB 13.2 09/14/2022   HCT 39.5 09/14/2022   MCV 96.1 09/14/2022   PLT 195 09/14/2022   Lab Results  Component Value Date   NA 137 02/01/2023   K 4.4 02/01/2023   CO2 22 02/01/2023   GLUCOSE 98 02/01/2023   BUN 22 02/01/2023   CREATININE 1.24 (H) 02/01/2023   BILITOT 0.6 02/01/2023   ALKPHOS 61 02/01/2023   AST 34  02/01/2023   ALT 25 02/01/2023   PROT 6.7 02/01/2023   ALBUMIN 4.5 02/01/2023   CALCIUM 10.3 02/01/2023   ANIONGAP 9 10/28/2022   EGFR 47 (L) 02/01/2023   Lab Results  Component Value Date   CHOL 189 10/28/2022   Lab Results  Component Value Date   HDL 26 (L) 10/28/2022   Lab Results  Component Value Date   LDLCALC 131 (H) 10/28/2022   Lab Results  Component Value Date   TRIG 160 (H) 10/28/2022   Lab Results  Component Value Date   CHOLHDL 7.3 10/28/2022   Lab Results  Component Value Date   HGBA1C 5.6 02/01/2023      Assessment & Plan:   Problem List Items Addressed This Visit       Cardiovascular and Mediastinum   Essential hypertension, benign     BP Readings from Last 1 Encounters:  02/01/23 128/77  Well-controlled with Irbesartan 75 mg QD Counseled for compliance with the medications Advised DASH diet and moderate exercise/walking as tolerated      Relevant Medications   irbesartan (AVAPRO) 150 MG tablet     Endocrine   Hypothyroidism - Primary    Lab Results  Component Value Date   TSH 2.248 10/28/2022  Was on Levothyroxine, was recently stopped due to borderline elevated free T4 by Dr Fransico Him, but has had weight gain and fatigue since stopping it Follows up with Dr Fransico Him Check TSH and free T4 - plan to start Levothyroxine 25 mcg again  based of blood tests      Relevant Orders   TSH + free T4 (Completed)   CMP14+EGFR (Completed)     Other   Anxiety (Chronic)    H/o panic disorder as well Well-controlled with Xanax Xanax 1 mg BID PRN, takes it only for panic episodes      Chronic insomnia (Chronic)    Well-controlled Takes Ambien 10 mg qHS PRN      Morbid obesity (HCC)    BMI Readings from Last 3 Encounters:  02/01/23 35.33 kg/m  01/29/23 34.93 kg/m  12/29/22 34.46 kg/m  Associated with HTN, HLD, OA and GAD/MDD Advised to continue low carb diet       Hyperlipidemia    On fenofibrate Check lipid profile      Relevant  Medications   irbesartan (AVAPRO) 150 MG tablet   Prediabetes    Lab Results  Component Value Date   HGBA1C 5.6 02/01/2023  Continue to follow low carb diet      Relevant Orders   Hemoglobin A1c (Completed)   CMP14+EGFR (Completed)   Meds ordered this encounter  Medications   irbesartan (AVAPRO) 150 MG tablet    Sig: Take 0.5 tablets (75 mg total) by mouth daily.    Dispense:  30 tablet    Refill:  0    Follow-up: Return in about 4 months (around 06/03/2023) for HTN and anxiety.    Anabel Halon, MD

## 2023-02-01 NOTE — Assessment & Plan Note (Signed)
Well-controlled Takes Ambien 10 mg qHS PRN

## 2023-02-01 NOTE — Assessment & Plan Note (Addendum)
Lab Results  Component Value Date   TSH 2.248 10/28/2022   Was on Levothyroxine, was recently stopped due to borderline elevated free T4 by Dr Fransico Him, but has had weight gain and fatigue since stopping it Follows up with Dr Fransico Him Check TSH and free T4 - plan to start Levothyroxine 25 mcg again based of blood tests

## 2023-02-02 ENCOUNTER — Telehealth: Payer: Self-pay | Admitting: Internal Medicine

## 2023-02-02 LAB — HEMOGLOBIN A1C
Est. average glucose Bld gHb Est-mCnc: 114 mg/dL
Hgb A1c MFr Bld: 5.6 % (ref 4.8–5.6)

## 2023-02-02 LAB — CMP14+EGFR
ALT: 25 IU/L (ref 0–32)
AST: 34 IU/L (ref 0–40)
Albumin/Globulin Ratio: 2 (ref 1.2–2.2)
Albumin: 4.5 g/dL (ref 3.9–4.9)
Alkaline Phosphatase: 61 IU/L (ref 44–121)
BUN/Creatinine Ratio: 18 (ref 12–28)
BUN: 22 mg/dL (ref 8–27)
Bilirubin Total: 0.6 mg/dL (ref 0.0–1.2)
CO2: 22 mmol/L (ref 20–29)
Calcium: 10.3 mg/dL (ref 8.7–10.3)
Chloride: 98 mmol/L (ref 96–106)
Creatinine, Ser: 1.24 mg/dL — ABNORMAL HIGH (ref 0.57–1.00)
Globulin, Total: 2.2 g/dL (ref 1.5–4.5)
Glucose: 98 mg/dL (ref 70–99)
Potassium: 4.4 mmol/L (ref 3.5–5.2)
Sodium: 137 mmol/L (ref 134–144)
Total Protein: 6.7 g/dL (ref 6.0–8.5)
eGFR: 47 mL/min/{1.73_m2} — ABNORMAL LOW (ref >59)

## 2023-02-02 LAB — TSH+FREE T4
Free T4: 1.08 ng/dL (ref 0.82–1.77)
TSH: 4.58 u[IU]/mL — ABNORMAL HIGH (ref 0.450–4.500)

## 2023-02-02 NOTE — Telephone Encounter (Signed)
Patient called regarding blood work results in [employer] call back # [phone]. Does patient need to go back on her thyroid medicine.

## 2023-02-02 NOTE — Telephone Encounter (Signed)
Dr patel has not reviewed labs, once done will call patient

## 2023-02-03 ENCOUNTER — Other Ambulatory Visit: Payer: Self-pay | Admitting: Internal Medicine

## 2023-02-03 DIAGNOSIS — E039 Hypothyroidism, unspecified: Secondary | ICD-10-CM

## 2023-02-03 MED ORDER — LEVOTHYROXINE SODIUM 25 MCG PO TABS: 25.0000 ug | ORAL_TABLET | Freq: Every day | ORAL | 1 refills | Status: DC

## 2023-02-03 NOTE — Telephone Encounter (Signed)
Called pt gave her lab results.

## 2023-02-05 DIAGNOSIS — R7303 Prediabetes: Secondary | ICD-10-CM | POA: Insufficient documentation

## 2023-02-05 NOTE — Assessment & Plan Note (Signed)
Lab Results  Component Value Date   HGBA1C 5.6 02/01/2023   Continue to follow low carb diet

## 2023-02-08 DIAGNOSIS — H04123 Dry eye syndrome of bilateral lacrimal glands: Secondary | ICD-10-CM | POA: Diagnosis not present

## 2023-02-08 DIAGNOSIS — H401133 Primary open-angle glaucoma, bilateral, severe stage: Secondary | ICD-10-CM | POA: Diagnosis not present

## 2023-02-08 DIAGNOSIS — Z961 Presence of intraocular lens: Secondary | ICD-10-CM | POA: Diagnosis not present

## 2023-03-22 ENCOUNTER — Telehealth: Payer: Self-pay | Admitting: Neurology

## 2023-03-22 NOTE — Telephone Encounter (Signed)
Pt asking for front desk Eula Fried. To call back to discuss transportaion for appt on 04/15/23 with Dr. Teresa Coombs.

## 2023-03-24 ENCOUNTER — Telehealth: Payer: Self-pay | Admitting: Internal Medicine

## 2023-03-24 NOTE — Telephone Encounter (Signed)
Patient called was schedule an appointment come into office 06.24.2024 and transportation has not yet contacted patient for arrangements. Can not do [facility] they will not help her.  Patient has always used [facility].  Please advise.

## 2023-03-24 NOTE — Telephone Encounter (Signed)
Patient transportation has been arranged for patient by FO

## 2023-03-29 ENCOUNTER — Encounter: Payer: Self-pay | Admitting: Internal Medicine

## 2023-03-29 ENCOUNTER — Ambulatory Visit (INDEPENDENT_AMBULATORY_CARE_PROVIDER_SITE_OTHER): Payer: Medicare Other | Admitting: Internal Medicine

## 2023-03-29 VITALS — BP 92/62 | HR 98 | Ht 67.0 in | Wt 216.0 lb

## 2023-03-29 DIAGNOSIS — I10 Essential (primary) hypertension: Secondary | ICD-10-CM | POA: Diagnosis not present

## 2023-03-29 DIAGNOSIS — J209 Acute bronchitis, unspecified: Secondary | ICD-10-CM | POA: Diagnosis not present

## 2023-03-29 DIAGNOSIS — J4530 Mild persistent asthma, uncomplicated: Secondary | ICD-10-CM | POA: Diagnosis not present

## 2023-03-29 MED ORDER — BUDESONIDE-FORMOTEROL FUMARATE 160-4.5 MCG/ACT IN AERO: 2.0000 | INHALATION_SPRAY | RESPIRATORY_TRACT | 3 refills | Status: DC | PRN

## 2023-03-29 MED ORDER — GUAIFENESIN-CODEINE 100-10 MG/5ML PO SYRP: 5.0000 mL | ORAL_SOLUTION | Freq: Three times a day (TID) | ORAL | 0 refills | Status: DC | PRN

## 2023-03-29 MED ORDER — METHYLPREDNISOLONE 4 MG PO TBPK: ORAL_TABLET | ORAL | 0 refills | Status: DC

## 2023-03-29 MED ORDER — ALBUTEROL SULFATE (2.5 MG/3ML) 0.083% IN NEBU: 2.5000 mg | INHALATION_SOLUTION | RESPIRATORY_TRACT | 3 refills | Status: DC | PRN

## 2023-03-29 MED ORDER — AZITHROMYCIN 250 MG PO TABS: ORAL_TABLET | ORAL | 0 refills | Status: AC

## 2023-03-29 NOTE — Assessment & Plan Note (Signed)
  BP Readings from Last 1 Encounters:  03/29/23 92/62   Well-controlled, actually low normal with Irbesartan 75 mg every day -advised to hold irbesartan if she has dizziness Counseled for compliance with the medications Advised DASH diet and moderate exercise/walking as tolerated

## 2023-03-29 NOTE — Progress Notes (Signed)
Acute Office Visit  Subjective:    Patient ID: [name], female    DOB: 1951/07/12, 72 y.o.   MRN: [id number]  Chief Complaint  Patient presents with   Cough    Patient has had an ongoing cough for a couple of months    HPI Patient is in today for complaint of cough, postnasal drip, sore throat and mild worsening of her dyspnea for the last 2 months.  She has tried OTC Tylenol cold and sinus without much relief.  She is using Symbicort and as needed albuterol nebulizer for asthma.  She has tried Trelegy for asthma, but did not have adequate response.  She prefers to continue Symbicort for asthma.  Her BP was low normal today.  She has had episodes of dizziness recently.  Denies any chest pain or palpitations.  Past Medical History:  Diagnosis Date   Anal fissure    Anxiety    Arthritis    Asthma    C. difficile diarrhea 09/17/2021   Cataract    Phreesia 01/05/2021   Chest pain    a. 02/2000 Cath: nl cors, EF 60%;  b. 10/2010 MV: nl LV, no ischemia/infarct;  c. 03/2014 Admit c/p, r/o->grief from husbands death.   Chronic RUQ pain 05/01/2008   EUS slightly dilated CBD (7.76mm), otherwise nl   Depression    Diverticulosis    Eczema    Exposure to chemical inhalation mid 1970s   Fatty liver    G6PD deficiency    Gallstones    GERD (gastroesophageal reflux disease)    Glaucoma    History of pulmonary embolus (PE)    Treated with Eliquis May 02, 2015   HTN (hypertension)    Hypercholesteremia    a. intolerant to statins.   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 She has limited vision in the right eye.   Migraines    inner ocular   Ocular migraine    Palpitations    Pleurisy    Pneumonia 07/16/2021   Patient was exposed to a harsh chemical in the 1970's that created scar tissue in her lungs. She has had pneumonia several times in the past.   PONV (postoperative nausea and vomiting)     Pulmonary emboli (HCC)    Recurrent upper respiratory infection (URI)    Renal cyst    Retinal cyst    Sickle cell trait (HCC)    Tubular adenoma of colon 09/17/2011   Urticaria     Past Surgical History:  Procedure Laterality Date   ABDOMINAL HYSTERECTOMY     ADENOIDECTOMY     ANGIOPLASTY     APPENDECTOMY     BIOPSY N/A 03/14/2013   Procedure: SMALL BOWEL AND GASTRIC BIOPSIES (Procedure #1);  Surgeon: West Bali, MD;  Location: [facility];  Service: Endoscopy;  Laterality: N/A;   CARDIAC CATHETERIZATION Left 02/11/2000   CATARACT EXTRACTION Left    CATARACT EXTRACTION W/PHACO Right 03/19/2021   Procedure: CATARACT EXTRACTION PHACO AND INTRAOCULAR LENS PLACEMENT (IOC) RIGHT 6.75 00:50.4;  Surgeon: Lockie Mola, MD;  Location: [facility];  Service: Ophthalmology;  Laterality: Right;   CHOLECYSTECTOMY  12/2007   COLONOSCOPY  12/22/2010   AOZ:HYQMVH, cecal adenomatous polyp   COLONOSCOPY WITH PROPOFOL N/A 03/31/2016   Dr. Darrick Penna: four sessile polyps rectum/sigmoid colon, diverticulosi, ext/int hemorrhoids. hyperplastic polyps, next tcs 5 years.   COLONOSCOPY  WITH PROPOFOL N/A 04/21/2021   anal fissure, non-bleeding internal hemorrhoids, right colon diverticulosis, one 2 mm polyp in ascending, three 4-6 mm polyps in descending colon. Tubular adenomas. 5 year surveillance.   complete hysterectomy     ENTEROSCOPY N/A 03/14/2013   HCW:CBJS gastritis/ulcers has healed   ESOPHAGOGASTRODUODENOSCOPY  09/22/2011   EGB:TDVV gastritis/Duodenitis   EXCISIONAL HEMORRHOIDECTOMY     FLEXIBLE SIGMOIDOSCOPY N/A 03/14/2013   SLF:3 colon polyp removed/moderate sized internal hemorrhoids   FLEXIBLE SIGMOIDOSCOPY N/A 06/09/2016   Procedure: FLEXIBLE SIGMOIDOSCOPY;  Surgeon: West Bali, MD;  [facility]: [facility];  Service: Endoscopy;  Laterality: N/A;  rectal polyps times 2   FLEXIBLE SIGMOIDOSCOPY N/A 03/18/2018   Procedure: FLEXIBLE SIGMOIDOSCOPY;  Surgeon: West Bali,  MD;  Location: [facility];  Service: Endoscopy;  Laterality: N/A;  9:15am   FOOT SURGERY     bunion removal left   GIVENS CAPSULE STUDY N/A 02/10/2013   Procedure: GIVENS CAPSULE STUDY;  Surgeon: West Bali, MD;  Location: [facility];  Service: Endoscopy;  Laterality: N/A;  730   HEEL SPUR EXCISION     right    HEMORRHOID BANDING N/A 03/14/2013   Procedure: HEMORRHOID BANDING (Procedure #3)  3 bands applied [id number]#[id number] Exp 02/02/2014 ;  Surgeon: West Bali, MD;  Location: [facility];  Service: Endoscopy;  Laterality: N/A;   HEMORRHOID SURGERY N/A 04/24/2016   Procedure: EXTENSIVE HEMORRHOIDECTOMY;  Surgeon: Ancil Linsey, MD;  Location: [facility];  Service: General;  Laterality: N/A;   LAPAROSCOPY     adhesions   POLYPECTOMY N/A 03/14/2013   IRS:WNIO Gastritis . ULCERS SEEN ON MAY 6 HAVE HEALED   POLYPECTOMY  03/31/2016   Procedure: POLYPECTOMY;  Surgeon: West Bali, MD;  Location: [facility];  Service: Endoscopy;;  sigmoid colon polyps x2, rectal polyps x2   POLYPECTOMY  04/21/2021   Procedure: POLYPECTOMY;  Surgeon: Lanelle Bal, DO;  Location: [facility];  Service: Endoscopy;;   RECTAL EXAM UNDER ANESTHESIA N/A 07/23/2021   Procedure: RECTAL EXAM UNDER ANESTHESIA;  Surgeon: Romie Levee, MD;  Location[facility];  Service: General;  Laterality: N/A;   SPHINCTEROTOMY N/A 07/23/2021   Procedure: CHEMICAL SPHINCTEROTOMY;  Surgeon: Romie Levee, MD;  Location: [facility];  Service: General;  Laterality: N/A;   TONSILLECTOMY     TUBAL LIGATION      Family History  Problem Relation Age of Onset   Colon cancer Mother        55s   Urticaria Mother    Heart disease Mother    Colon polyps Mother    Cancer Father        oral   Crohn's disease Sister    Cancer Sister        around heart   Colon polyps Sister    Diabetes Brother    Other Brother        intestinal sugery   Colon polyps Brother    Colon cancer Maternal  Grandmother    Colon cancer Maternal Grandfather    Colon cancer Paternal Grandfather    Anesthesia problems Neg Hx     Social History   Socioeconomic History   Marital status: Widowed    Spouse name: Not on file   Number of children: 1   Years of education: Not on file   Highest education level: Not on file  Occupational History   Occupation: homemaker    Employer: UNEMPLOYED  Tobacco Use  Smoking status: Former    Packs/day: 0.50    Years: 30.00    Additional pack years: 0.00    Total pack years: 15.00    Types: Cigarettes    Quit date: 10/15/1996    Years since quitting: 26.4   Smokeless tobacco: Never   Tobacco comments:    Stopped smoking ~ 2000  Vaping Use   Vaping Use: Never used  Substance and Sexual Activity   Alcohol use: Yes    Alcohol/week: 4.0 standard drinks of alcohol    Types: 4 Glasses of wine per week    Comment: 1 drinks every couple of weeks   Drug use: No   Sexual activity: Yes    Birth control/protection: Surgical  Other Topics Concern   Not on file  Social History Narrative   Does not routinely exercise. Husband passed in JUN 2015 due to prostate ca.   Social Determinants of Health   Financial Resource Strain: Low Risk  (05/18/2022)   Overall Financial Resource Strain (CARDIA)    Difficulty of Paying Living Expenses: Not hard at all  Food Insecurity: No Food Insecurity (05/18/2022)   Hunger Vital Sign    Worried About Running Out of Food in the Last Year: Never true    Ran Out of Food in the Last Year: Never true  Transportation Needs: Unmet Transportation Needs (08/13/2022)   PRAPARE - Administrator, Civil Service (Medical): Yes    Lack of Transportation (Non-Medical): Yes  Physical Activity: Insufficiently Active (05/18/2022)   Exercise Vital Sign    Days of Exercise per Week: 3 days    Minutes of Exercise per Session: 20 min  Stress: No Stress Concern Present (05/18/2022)   [employer] of Occupational Health -  Occupational Stress Questionnaire    Feeling of Stress : Not at all  Social Connections: Socially Isolated (05/18/2022)   Social Connection and Isolation Panel [NHANES]    Frequency of Communication with Friends and Family: Once a week    Frequency of Social Gatherings with Friends and Family: Once a week    Attends Religious Services: Never    Database administrator or Organizations: No    Attends Banker Meetings: Never    Marital Status: Widowed  Intimate Partner Violence: Not At Risk (05/18/2022)   Humiliation, Afraid, Rape, and Kick questionnaire    Fear of Current or Ex-Partner: No    Emotionally Abused: No    Physically Abused: No    Sexually Abused: No    Outpatient Medications Prior to Visit  Medication Sig Dispense Refill   levothyroxine (SYNTHROID) 25 MCG tablet Take 1 tablet (25 mcg total) by mouth daily. 90 tablet 1   albuterol (VENTOLIN HFA) 108 (90 Base) MCG/ACT inhaler Inhale 2 puffs into the lungs every 4 (four) hours as needed for wheezing. 54 g 3   ALPRAZolam (XANAX) 1 MG tablet Take 1 tablet (1 mg total) by mouth 2 (two) times daily as needed for anxiety. (Patient taking differently: Take 0.5-1 mg by mouth at bedtime.) 180 tablet 3   brimonidine (ALPHAGAN) 0.2 % ophthalmic solution Place 1 drop into both eyes 2 (two) times daily. 15 mL 3   Cholecalciferol (VITAMIN D3) 50 MCG (2000 UT) TABS Take 2,000 Units by mouth daily.     dexlansoprazole (DEXILANT) 60 MG capsule Take 1 capsule (60 mg total) by mouth daily. 90 capsule 3   diclofenac Sodium (VOLTAREN) 1 % GEL Apply 4 g topically 4 (four) times daily.  100 g 2   dicyclomine (BENTYL) 10 MG capsule Take one capsule before meals and at bedtime AS NEEDED for abdominal pain and frequent stool. HOLD for constipation (Patient taking differently: Take 10 mg by mouth daily as needed (IBS).) 120 capsule 1   DUREZOL 0.05 % EMUL Place 1 drop into the right eye daily.     estradiol (ESTRACE VAGINAL) 0.1 MG/GM vaginal  cream Place 1 Applicatorful vaginally daily as needed. 42.5 g 3   fenofibrate (TRICOR) 145 MG tablet Take 1 tablet (145 mg total) by mouth daily. 90 tablet 3   hyoscyamine (ANASPAZ) 0.125 MG TBDP disintergrating tablet Place 1 tablet (0.125 mg total) under the tongue daily as needed (IBS). Place 0.125 mg under the tongue daily as needed (IBS). 30 tablet 0   irbesartan (AVAPRO) 150 MG tablet Take 0.5 tablets (75 mg total) by mouth daily. 30 tablet 0   latanoprost (XALATAN) 0.005 % ophthalmic solution Place 1 drop into both eyes at bedtime. 9 mL 3   Lidocaine-Hydrocortisone Ace 3-2.5 % KIT APPLY TO RECTUM QID for 2 weeks then AS NEEDED FOR RECTAL PAIN OR BLEEDING 1 kit 11   Lifitegrast (XIIDRA) 5 % SOLN INSTILL 1 DROP IN BOTH EYES EVERY DAY AS NEEDED (EACH CONTAINER SHOULD YIELD 2 DROPS) STORE IN ORIGINAL FOIL POUCH (Patient taking differently: Place 1 drop into both eyes daily. (EACH CONTAINER SHOULD YIELD 2 DROPS) STORE IN ORIGINAL FOIL POUCH) 60 each 3   Magnesium 500 MG TABS Take 1 tablet by mouth daily.     meclizine (ANTIVERT) 12.5 MG tablet Take 1 tablet (12.5 mg total) by mouth 3 (three) times daily as needed for dizziness. (Patient taking differently: Take 12.5 mg by mouth 3 (three) times daily as needed (vertigo).) 30 tablet 3   Menthol, Topical Analgesic, (BIOFREEZE EX) Apply 1 application topically daily as needed (pain).     metroNIDAZOLE (METROCREAM) 0.75 % cream Apply 1 application. topically as needed. 45 g 3   naproxen (NAPROSYN) 500 MG tablet Take 1 tablet (500 mg total) by mouth 2 (two) times daily with a meal. 60 tablet 5   neomycin-polymyxin b-dexamethasone (MAXITROL) 3.5-10000-0.1 OINT Place 1 application into both eyes at bedtime as needed. (Patient taking differently: Place 1 application  into both eyes at bedtime as needed (Dry eyes).) 10.5 g 3   nitroGLYCERIN (NITROLINGUAL) 0.4 MG/SPRAY spray Place 1 spray under the tongue every 5 (five) minutes as needed for chest pain. 12 g 3    NON FORMULARY Take 1 tablet by mouth daily as needed (Stomach Bloat). FD GUARD     ondansetron (ZOFRAN) 4 MG tablet Take 1 tablet (4 mg total) by mouth every 8 (eight) hours as needed for nausea or vomiting. 30 tablet 1   prednisoLONE acetate (PRED FORTE) 1 % ophthalmic suspension Place 1 drop into the right eye 3 (three) times daily.     Probiotic Product (PROBIOTIC DAILY PO) Take 1 capsule by mouth daily. VSL #3     Rimegepant Sulfate (NURTEC) 75 MG TBDP Take 1 tablet by mouth as needed. (Patient not taking: Reported on 01/29/2023)     rizatriptan (MAXALT-MLT) 10 MG disintegrating tablet Take 1 tablet (10 mg total) by mouth as needed for migraine. May repeat in 2 hours if needed. (ok to send out this medication to the patient, the patient can tolerate this medication and will be monitored, if this is not acceptable please cancel this script and send in a new erx with a note, thanks) 9 tablet  6   UNABLE TO FIND Place 1 application rectally daily as needed. [facility]  Med Name: lidocaine 5% pramoxine 1% ointment per rectum as needed.     zolpidem (AMBIEN) 10 MG tablet Take 1 tablet (10 mg total) by mouth at bedtime as needed for sleep. 90 tablet 1   albuterol (PROVENTIL) (2.5 MG/3ML) 0.083% nebulizer solution Take 3 mLs (2.5 mg total) by nebulization every 4 (four) hours as needed for wheezing or shortness of breath. 150 mL 3   budesonide-formoterol (SYMBICORT) 160-4.5 MCG/ACT inhaler Inhale 2 puffs into the lungs as needed. 10.2 g 3   No facility-administered medications prior to visit.    Allergies  Allergen Reactions   Nutmeg Oil (Myristica Oil) Anaphylaxis    Nut oil- skin irritation. Nut oil- skin irritation.   Adhesive [Tape] Other (See Comments)    Blisters skin   Aspirin Other (See Comments)    sickle cell trait--  Not recommended unless emergency   Imitrex [Sumatriptan] Hives   Keflex [Cephalexin] Other (See Comments)    G6PD   Levaquin [Levofloxacin In D5w]      Altered mental status Affects G6PD   Other     Nut Oil- skin irritation    Statins Other (See Comments)    Myopathy - elevated CK Myopathy - elevated CK Myopathy-elevated CK   Sulfa Antibiotics Other (See Comments)    Patient has sickle cell trait Sickle cell trait.   Sulfonamide Derivatives Other (See Comments)    Patient has sickle cell trait   Latex Rash    Review of Systems  Constitutional:  Positive for unexpected weight change. Negative for chills and fever.  HENT:  Positive for congestion, postnasal drip and sore throat. Negative for sinus pressure and sinus pain.   Eyes:  Positive for photophobia, pain and visual disturbance. Negative for discharge.  Respiratory:  Positive for cough and shortness of breath.   Cardiovascular:  Negative for chest pain and palpitations.  Gastrointestinal:  Positive for constipation. Negative for abdominal pain, blood in stool, diarrhea, nausea and vomiting.  Endocrine: Negative for polydipsia and polyuria.  Genitourinary:  Negative for dysuria and hematuria.  Musculoskeletal:  Positive for arthralgias, back pain, neck pain and neck stiffness.  Skin:  Negative for rash.  Neurological:  Positive for dizziness and headaches. Negative for weakness.  Psychiatric/Behavioral:  Negative for agitation and behavioral problems.        Objective:    Physical Exam Vitals reviewed.  Constitutional:      General: She is not in acute distress.    Appearance: She is not diaphoretic.  HENT:     Head: Normocephalic and atraumatic.     Nose: Congestion present.     Mouth/Throat:     Mouth: Mucous membranes are moist.  Eyes:     General: No scleral icterus.    Extraocular Movements: Extraocular movements intact.  Cardiovascular:     Rate and Rhythm: Normal rate and regular rhythm.     Pulses: Normal pulses.     Heart sounds: Normal heart sounds. No murmur heard. Pulmonary:     Breath sounds: Wheezing (Mild, diffuse) present. No rales.   Musculoskeletal:        General: Tenderness (Lower lumbar spine area) present.     Left hand: Decreased strength of thumb/finger opposition.     Cervical back: Neck supple. No tenderness.     Right lower leg: No edema.     Left lower leg: No edema.  Skin:  General: Skin is warm.     Findings: No rash.  Neurological:     General: No focal deficit present.     Mental Status: She is alert and oriented to person, place, and time.     Sensory: No sensory deficit.     Motor: No weakness.  Psychiatric:        Mood and Affect: Mood normal.        Behavior: Behavior normal.     BP 92/62 (BP Location: Right Arm, Patient Position: Sitting, Cuff Size: Large)   Pulse 98   Ht 5\' 7"  (1.702 m)   Wt 216 lb (98 kg)   SpO2 91%   BMI 33.83 kg/m  Wt Readings from Last 3 Encounters:  03/29/23 216 lb (98 kg)  02/01/23 225 lb 9.6 oz (102.3 kg)  01/29/23 223 lb (101.2 kg)        Assessment & Plan:   Problem List Items Addressed This Visit       Cardiovascular and Mediastinum   Essential hypertension, benign     BP Readings from Last 1 Encounters:  03/29/23 92/62  Well-controlled, actually low normal with Irbesartan 75 mg every day -advised to hold irbesartan if she has dizziness Counseled for compliance with the medications Advised DASH diet and moderate exercise/walking as tolerated        Respiratory   Asthma    Likely has asthma exacerbation or acute bronchitis Started Medrol dosepak On Symbicort, had trial of Trelegy as she had severe symptoms despite using Symbicort, but did not respond well Albuterol PRN for dyspnea or wheezing Refilled albuterol nebulizer as well as she responds better to nebulization      Relevant Medications   albuterol (PROVENTIL) (2.5 MG/3ML) 0.083% nebulizer solution   methylPREDNISolone (MEDROL DOSEPAK) 4 MG TBPK tablet   budesonide-formoterol (SYMBICORT) 160-4.5 MCG/ACT inhaler   Acute bronchitis - Primary    Started empiric azithromycin as  she has some symptoms of sinusitis as well Started Medrol Dosepak Continue Symbicort and as needed albuterol nebulization for dyspnea or wheezing Cheratussin PRN for cough      Relevant Medications   guaiFENesin-codeine (ROBITUSSIN AC) 100-10 MG/5ML syrup   methylPREDNISolone (MEDROL DOSEPAK) 4 MG TBPK tablet   azithromycin (ZITHROMAX) 250 MG tablet     Meds ordered this encounter  Medications   albuterol (PROVENTIL) (2.5 MG/3ML) 0.083% nebulizer solution    Sig: Take 3 mLs (2.5 mg total) by nebulization every 4 (four) hours as needed for wheezing or shortness of breath.    Dispense:  150 mL    Refill:  3    FAX ZO:XWRU BY [facility]IL- [id number]   guaiFENesin-codeine (ROBITUSSIN AC) 100-10 MG/5ML syrup    Sig: Take 5 mLs by mouth 3 (three) times daily as needed for cough.    Dispense:  120 mL    Refill:  0   methylPREDNISolone (MEDROL DOSEPAK) 4 MG TBPK tablet    Sig: Take as package instructions.    Dispense:  1 each    Refill:  0   azithromycin (ZITHROMAX) 250 MG tablet    Sig: Take 2 tablets on day 1, then 1 tablet daily on days 2 through 5    Dispense:  6 tablet    Refill:  0   DISCONTD: budesonide-formoterol (SYMBICORT) 160-4.5 MCG/ACT inhaler    Sig: Inhale 2 puffs into the lungs as needed.    Dispense:  10.2 g    Refill:  3    FAX JY:NWGN BY MAIL- [id number]  budesonide-formoterol (SYMBICORT) 160-4.5 MCG/ACT inhaler    Sig: Inhale 2 puffs into the lungs as needed.    Dispense:  10.2 g    Refill:  3    FAX AV:WUJW BY MAIL- [id number]     Anabel Halon, MD

## 2023-03-29 NOTE — Assessment & Plan Note (Addendum)
Started empiric azithromycin as she has some symptoms of sinusitis as well Started Medrol Dosepak Continue Symbicort and as needed albuterol nebulization for dyspnea or wheezing Cheratussin PRN for cough

## 2023-03-29 NOTE — Assessment & Plan Note (Addendum)
Likely has asthma exacerbation or acute bronchitis Started Medrol dosepak On Symbicort, had trial of Trelegy as she had severe symptoms despite using Symbicort, but did not respond well Albuterol PRN for dyspnea or wheezing Refilled albuterol nebulizer as well as she responds better to nebulization

## 2023-03-29 NOTE — Patient Instructions (Addendum)
Please start taking Azithromycin and Prednisone as prescribed for bronchitis.  Please take Cheratussin as needed for cough.  Please continue using Symbicort and as needed Albuterol nebulizer for asthma.

## 2023-04-13 ENCOUNTER — Telehealth: Payer: Self-pay | Admitting: Internal Medicine

## 2023-04-13 ENCOUNTER — Other Ambulatory Visit: Payer: Self-pay

## 2023-04-13 MED ORDER — FENOFIBRATE 145 MG PO TABS: 145.0000 mg | ORAL_TABLET | Freq: Every day | ORAL | 3 refills | Status: DC

## 2023-04-13 NOTE — Telephone Encounter (Signed)
Refills sent to pharmacy. 

## 2023-04-13 NOTE — Telephone Encounter (Signed)
Prescription Request  04/13/2023  LOV: 03/29/2023  What is the name of the medication or equipment? fenofibrate (TRICOR) 145 MG tablet   PT WANTS TO MAKE SURE SHE HAS REFILLS ON MED   Have you contacted your pharmacy to request a refill? Yes   Which pharmacy would you like this sent to?  [facility] - [place], [place] - [address] [facility] [address] [place] [address] Phone: [phone] Fax: [phone]    Patient notified that their request is being sent to the clinical staff for review and that they should receive a response within 2 business days.   Please advise at [facility] [phone]

## 2023-04-15 ENCOUNTER — Ambulatory Visit (INDEPENDENT_AMBULATORY_CARE_PROVIDER_SITE_OTHER): Payer: Medicare Other | Admitting: Neurology

## 2023-04-15 ENCOUNTER — Encounter: Payer: Self-pay | Admitting: Neurology

## 2023-04-15 VITALS — BP 134/69 | HR 101 | Ht 67.0 in | Wt 210.5 lb

## 2023-04-15 DIAGNOSIS — G43009 Migraine without aura, not intractable, without status migrainosus: Secondary | ICD-10-CM | POA: Diagnosis not present

## 2023-04-15 MED ORDER — GABAPENTIN 100 MG PO CAPS: 100.0000 mg | ORAL_CAPSULE | Freq: Every day | ORAL | 0 refills | Status: DC

## 2023-04-15 NOTE — Progress Notes (Signed)
[facility]  PATIENT: [name] DOB: 11/07/1950  REQUESTING CLINICIAN: Anabel Halon, MD HISTORY FROM: Patient  REASON FOR VISIT: Migraines   HISTORICAL  CHIEF COMPLAINT:  Chief Complaint  Patient presents with   Migraine    Rm12, alone, 2 migraines in past 30 days, no problems/concerns   INTERVAL HISTORY 04/15/2023:  Patient presents today for follow-up, she is alone.  Last visit was in January and since then she continues to have headaches.  She reports on average 2 headaches per month for which Maxalt helps a lot.  She is not on any preventive medication.  She reports decreased vision, she is following with ophthalmology.  She has glaucoma.  She does report sometimes she is straining and believes this is triggering her headaches.  No other complaint no other concerns.   INTERVAL HISTORY 10/14/22:  Patient presents today for follow-up, last visit was in September.  At that time we have started her on Maxalt for abortive medication and Nurtec for preventive medication.  Due to previous report of patient being allergic to Nurtec, the Nurtec was not renewed.  She reports since September she had a total of 5 migraines in which the Maxalt has helped.  She reports Maxalt controlled her headaches.  She does not need the Nurtec as preventive medication anymore. She has not had any headaches for the past 2 months    INTERVAL HISTORY 06/24/22 Patient presents today for follow-up, last visit was in March.  Since then, she reports that her back pain has improved after getting back injections.  When it comes to the headaches, she denies any change in frequency.  At last visit I have started her on Nurtec every other day but patient is not sure if she is taking it.  She stated she takes a lot of medication and she is unable to tell me if she take Nurtec or not.  Currently when she is has a headache she does not take any medication.  Her MRI brain was completed, did not show  any acute intracranial abnormality.   HISTORY OF PRESENT ILLNESS:  This is a 72 year old woman with multiple medical conditions including chronic pain, hypertension, hyperlipidemia, glaucoma, hypothyroidism, migraine headaches who is presenting with complaint of right-sided weakness.  Patient reports history of right arm and right leg weakness. She presented to her orthopedist Dr. Hilda Lias and due to the complaint of right-sided arm and leg weakness, she was referred to Neurology.  At that same time she was also given a right shoulder ESI and had MRI of her shoulder.  MRI shoulder showed evidence of tears and arthritis and patient also reported that after the injection she felt much better.  At that time she did not have any neck pain.  Currently she reported the shoulder pain resolved, she is not experiencing neck pain, she is able to lift her right arm above her shoulder and this is a marked improvement.  For her right leg weakness pain, she attributed to chronic low back pain, she states that both legs sometimes do get weak. She denies any fall recently.    Patient also complained of migraine.  She reported a history of migraine, in the past she has tried Imitrex as abortive medication, as tired over-the-counter medications but lately her migraines have reappear.  Currently she is having 4 headaches days per week.  She is not on any abortive or preventive medication.  When she has the headaches, she reports putting ice or wrap  her head with something cold and going to sleep.   She does also note that sometimes she get confused, sometimes has word finding difficulty but she is able to hold conversation, she is still independent in all activities of daily living and all IADLs  She reports that she lives alone, she does not have any family here, her son is in [place]   OTHER MEDICAL CONDITIONS: Chronic pain, hypertension, glaucoma, hyperlipidemia, hypothyroidism    REVIEW OF SYSTEMS: Full 14 system  review of systems performed and negative with exception of: as noted in the HPI   ALLERGIES: Allergies  Allergen Reactions   Nutmeg Oil (Myristica Oil) Anaphylaxis    Nut oil- skin irritation. Nut oil- skin irritation.   Adhesive [Tape] Other (See Comments)    Blisters skin   Aspirin Other (See Comments)    sickle cell trait--  Not recommended unless emergency   Imitrex [Sumatriptan] Hives   Keflex [Cephalexin] Other (See Comments)    G6PD   Levaquin [Levofloxacin In D5w]     Altered mental status Affects G6PD   Other     Nut Oil- skin irritation    Statins Other (See Comments)    Myopathy - elevated CK Myopathy - elevated CK Myopathy-elevated CK   Sulfa Antibiotics Other (See Comments)    Patient has sickle cell trait Sickle cell trait.   Sulfonamide Derivatives Other (See Comments)    Patient has sickle cell trait   Latex Rash    HOME MEDICATIONS: Outpatient Medications Prior to Visit  Medication Sig Dispense Refill   albuterol (PROVENTIL) (2.5 MG/3ML) 0.083% nebulizer solution Take 3 mLs (2.5 mg total) by nebulization every 4 (four) hours as needed for wheezing or shortness of breath. 150 mL 3   albuterol (VENTOLIN HFA) 108 (90 Base) MCG/ACT inhaler Inhale 2 puffs into the lungs every 4 (four) hours as needed for wheezing. 54 g 3   ALPRAZolam (XANAX) 1 MG tablet Take 1 tablet (1 mg total) by mouth 2 (two) times daily as needed for anxiety. (Patient taking differently: Take 0.5-1 mg by mouth at bedtime.) 180 tablet 3   brimonidine (ALPHAGAN) 0.2 % ophthalmic solution Place 1 drop into both eyes 2 (two) times daily. 15 mL 3   budesonide-formoterol (SYMBICORT) 160-4.5 MCG/ACT inhaler Inhale 2 puffs into the lungs as needed. 10.2 g 3   Cholecalciferol (VITAMIN D3) 50 MCG (2000 UT) TABS Take 2,000 Units by mouth daily.     dexlansoprazole (DEXILANT) 60 MG capsule Take 1 capsule (60 mg total) by mouth daily. 90 capsule 3   dicyclomine (BENTYL) 10 MG capsule Take one capsule  before meals and at bedtime AS NEEDED for abdominal pain and frequent stool. HOLD for constipation (Patient taking differently: Take 10 mg by mouth daily as needed (IBS).) 120 capsule 1   DUREZOL 0.05 % EMUL Place 1 drop into the right eye daily.     estradiol (ESTRACE VAGINAL) 0.1 MG/GM vaginal cream Place 1 Applicatorful vaginally daily as needed. 42.5 g 3   fenofibrate (TRICOR) 145 MG tablet Take 1 tablet (145 mg total) by mouth daily. 90 tablet 3   guaiFENesin-codeine (ROBITUSSIN AC) 100-10 MG/5ML syrup Take 5 mLs by mouth 3 (three) times daily as needed for cough. 120 mL 0   hyoscyamine (ANASPAZ) 0.125 MG TBDP disintergrating tablet Place 1 tablet (0.125 mg total) under the tongue daily as needed (IBS). Place 0.125 mg under the tongue daily as needed (IBS). 30 tablet 0   irbesartan (AVAPRO) 150 MG tablet  Take 0.5 tablets (75 mg total) by mouth daily. 30 tablet 0   latanoprost (XALATAN) 0.005 % ophthalmic solution Place 1 drop into both eyes at bedtime. 9 mL 3   levothyroxine (SYNTHROID) 25 MCG tablet Take 1 tablet (25 mcg total) by mouth daily. 90 tablet 1   Lidocaine-Hydrocortisone Ace 3-2.5 % KIT APPLY TO RECTUM QID for 2 weeks then AS NEEDED FOR RECTAL PAIN OR BLEEDING 1 kit 11   Lifitegrast (XIIDRA) 5 % SOLN INSTILL 1 DROP IN BOTH EYES EVERY DAY AS NEEDED (EACH CONTAINER SHOULD YIELD 2 DROPS) STORE IN ORIGINAL FOIL POUCH (Patient taking differently: Place 1 drop into both eyes daily. (EACH CONTAINER SHOULD YIELD 2 DROPS) STORE IN ORIGINAL FOIL POUCH) 60 each 3   Magnesium 500 MG TABS Take 1 tablet by mouth daily.     meclizine (ANTIVERT) 12.5 MG tablet Take 1 tablet (12.5 mg total) by mouth 3 (three) times daily as needed for dizziness. (Patient taking differently: Take 12.5 mg by mouth 3 (three) times daily as needed (vertigo).) 30 tablet 3   Menthol, Topical Analgesic, (BIOFREEZE EX) Apply 1 application topically daily as needed (pain).     methylPREDNISolone (MEDROL DOSEPAK) 4 MG TBPK  tablet Take as package instructions. 1 each 0   metroNIDAZOLE (METROCREAM) 0.75 % cream Apply 1 application. topically as needed. 45 g 3   naproxen (NAPROSYN) 500 MG tablet Take 1 tablet (500 mg total) by mouth 2 (two) times daily with a meal. 60 tablet 5   neomycin-polymyxin b-dexamethasone (MAXITROL) 3.5-10000-0.1 OINT Place 1 application into both eyes at bedtime as needed. (Patient taking differently: Place 1 application  into both eyes at bedtime as needed (Dry eyes).) 10.5 g 3   nitroGLYCERIN (NITROLINGUAL) 0.4 MG/SPRAY spray Place 1 spray under the tongue every 5 (five) minutes as needed for chest pain. 12 g 3   NON FORMULARY Take 1 tablet by mouth daily as needed (Stomach Bloat). FD GUARD     ondansetron (ZOFRAN) 4 MG tablet Take 1 tablet (4 mg total) by mouth every 8 (eight) hours as needed for nausea or vomiting. 30 tablet 1   prednisoLONE acetate (PRED FORTE) 1 % ophthalmic suspension Place 1 drop into the right eye 3 (three) times daily.     Probiotic Product (PROBIOTIC DAILY PO) Take 1 capsule by mouth daily. VSL #3     Rimegepant Sulfate (NURTEC) 75 MG TBDP Take 1 tablet by mouth as needed.     rizatriptan (MAXALT-MLT) 10 MG disintegrating tablet Take 1 tablet (10 mg total) by mouth as needed for migraine. May repeat in 2 hours if needed. (ok to send out this medication to the patient, the patient can tolerate this medication and will be monitored, if this is not acceptable please cancel this script and send in a new erx with a note, thanks) 9 tablet 6   UNABLE TO FIND Place 1 application rectally daily as needed. [facility]  Med Name: lidocaine 5% pramoxine 1% ointment per rectum as needed.     zolpidem (AMBIEN) 10 MG tablet Take 1 tablet (10 mg total) by mouth at bedtime as needed for sleep. 90 tablet 1   diclofenac Sodium (VOLTAREN) 1 % GEL Apply 4 g topically 4 (four) times daily. (Patient not taking: Reported on 04/15/2023) 100 g 2   No facility-administered medications  prior to visit.    PAST MEDICAL HISTORY: Past Medical History:  Diagnosis Date   Anal fissure    Anxiety    Arthritis  Asthma    C. difficile diarrhea 09/17/2021   Cataract    Phreesia 01/05/2021   Chest pain    a. 02/2000 Cath: nl cors, EF 60%;  b. 10/2010 MV: nl LV, no ischemia/infarct;  c. 03/2014 Admit c/p, r/o->grief from husbands death.   Chronic RUQ pain April 24, 2008   EUS slightly dilated CBD (7.47mm), otherwise nl   Depression    Diverticulosis    Eczema    Exposure to chemical inhalation mid 1970s   Fatty liver    G6PD deficiency    Gallstones    GERD (gastroesophageal reflux disease)    Glaucoma    History of pulmonary embolus (PE)    Treated with Eliquis 04-25-2015   HTN (hypertension)    Hypercholesteremia    a. intolerant to statins.   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 She has limited vision in the right eye.   Migraines    inner ocular   Ocular migraine    Palpitations    Pleurisy    Pneumonia 07/16/2021   Patient was exposed to a harsh chemical in the 1970's that created scar tissue in her lungs. She has had pneumonia several times in the past.   PONV (postoperative nausea and vomiting)    Pulmonary emboli (HCC)    Recurrent upper respiratory infection (URI)    Renal cyst    Retinal cyst    Sickle cell trait (HCC)    Tubular adenoma of colon 09/17/2011   Urticaria     PAST SURGICAL HISTORY: Past Surgical History:  Procedure Laterality Date   ABDOMINAL HYSTERECTOMY     ADENOIDECTOMY     ANGIOPLASTY     APPENDECTOMY     BIOPSY N/A 03/14/2013   Procedure: SMALL BOWEL AND GASTRIC BIOPSIES (Procedure #1);  Surgeon: West Bali, MD;  Location: [facility];  Service: Endoscopy;  Laterality: N/A;   CARDIAC CATHETERIZATION Left 02/11/2000   CATARACT EXTRACTION Left    CATARACT EXTRACTION W/PHACO Right 03/19/2021   Procedure: CATARACT EXTRACTION PHACO AND INTRAOCULAR  LENS PLACEMENT (IOC) RIGHT 6.75 00:50.4;  Surgeon: Lockie Mola, MD;  Location: [facility];  Service: Ophthalmology;  Laterality: Right;   CHOLECYSTECTOMY  12/2007   COLONOSCOPY  12/22/2010   WUJ:WJXBJY, cecal adenomatous polyp   COLONOSCOPY WITH PROPOFOL N/A 03/31/2016   Dr. Darrick Penna: four sessile polyps rectum/sigmoid colon, diverticulosi, ext/int hemorrhoids. hyperplastic polyps, next tcs 5 years.   COLONOSCOPY WITH PROPOFOL N/A 04/21/2021   anal fissure, non-bleeding internal hemorrhoids, right colon diverticulosis, one 2 mm polyp in ascending, three 4-6 mm polyps in descending colon. Tubular adenomas. 5 year surveillance.   complete hysterectomy     ENTEROSCOPY N/A 03/14/2013   NWG:NFAO gastritis/ulcers has healed   ESOPHAGOGASTRODUODENOSCOPY  09/22/2011   ZHY:QMVH gastritis/Duodenitis   EXCISIONAL HEMORRHOIDECTOMY     FLEXIBLE SIGMOIDOSCOPY N/A 03/14/2013   SLF:3 colon polyp removed/moderate sized internal hemorrhoids   FLEXIBLE SIGMOIDOSCOPY N/A 06/09/2016   Procedure: FLEXIBLE SIGMOIDOSCOPY;  Surgeon: West Bali, MD;  Location: [facility];  Service: Endoscopy;  Laterality: N/A;  rectal polyps times 2   FLEXIBLE SIGMOIDOSCOPY N/A 03/18/2018   Procedure: FLEXIBLE SIGMOIDOSCOPY;  Surgeon: West Bali, MD;  Location: [facility];  Service: Endoscopy;  Laterality: N/A;  9:15am   FOOT SURGERY     bunion removal left   GIVENS CAPSULE STUDY N/A 02/10/2013   Procedure:  GIVENS CAPSULE STUDY;  Surgeon: West Bali, MD;  Location: [facility];  Service: Endoscopy;  Laterality: N/A;  730   HEEL SPUR EXCISION     right    HEMORRHOID BANDING N/A 03/14/2013   Procedure: HEMORRHOID BANDING (Procedure #3)  3 bands applied [id number] Exp 02/02/2014 ;  Surgeon: West Bali, MD;  Location: [facility];  Service: Endoscopy;  Laterality: N/A;   HEMORRHOID SURGERY N/A 04/24/2016   Procedure: EXTENSIVE HEMORRHOIDECTOMY;  Surgeon: Ancil Linsey, MD;  Location: [facility];   Service: General;  Laterality: N/A;   LAPAROSCOPY     adhesions   POLYPECTOMY N/A 03/14/2013   GNF:AOZH Gastritis . ULCERS SEEN ON MAY 6 HAVE HEALED   POLYPECTOMY  03/31/2016   Procedure: POLYPECTOMY;  Surgeon: West Bali, MD;  Location: [facility];  Service: Endoscopy;;  sigmoid colon polyps x2, rectal polyps x2   POLYPECTOMY  04/21/2021   Procedure: POLYPECTOMY;  Surgeon: Lanelle Bal, DO;  Location: [facility];  Service: Endoscopy;;   RECTAL EXAM UNDER ANESTHESIA N/A 07/23/2021   Procedure: RECTAL EXAM UNDER ANESTHESIA;  Surgeon: Romie Levee, MD;  Location: [facility];  Service: General;  Laterality: N/A;   SPHINCTEROTOMY N/A 07/23/2021   Procedure: CHEMICAL SPHINCTEROTOMY;  Surgeon: Romie Levee, MD;  Location: [facility] [place];  Service: General;  Laterality: N/A;   TONSILLECTOMY     TUBAL LIGATION      FAMILY HISTORY: Family History  Problem Relation Age of Onset   Colon cancer Mother        78s   Urticaria Mother    Heart disease Mother    Colon polyps Mother    Cancer Father        oral   Crohn's disease Sister    Cancer Sister        around heart   Colon polyps Sister    Diabetes Brother    Other Brother        intestinal sugery   Colon polyps Brother    Colon cancer Maternal Grandmother    Colon cancer Maternal Grandfather    Colon cancer Paternal Grandfather    Anesthesia problems Neg Hx     SOCIAL HISTORY: Social History   Socioeconomic History   Marital status: Widowed    Spouse name: Not on file   Number of children: 1   Years of education: Not on file   Highest education level: Not on file  Occupational History   Occupation: homemaker    Employer: UNEMPLOYED  Tobacco Use   Smoking status: Former    Current packs/day: 0.00    Average packs/day: 0.5 packs/day for 30.0 years (15.0 ttl pk-yrs)    Types: Cigarettes    Start date: 10/15/1966    Quit date: 10/15/1996    Years since quitting: 26.5    Smokeless tobacco: Never   Tobacco comments:    Stopped smoking ~ 2000  Vaping Use   Vaping status: Never Used  Substance and Sexual Activity   Alcohol use: Not Currently    Alcohol/week: 4.0 standard drinks of alcohol    Types: 4 Glasses of wine per week    Comment: 1 drinks every couple of weeks   Drug use: No   Sexual activity: Not Currently    Birth control/protection: Surgical  Other Topics Concern   Not on file  Social History Narrative   Does not routinely exercise. Husband passed in JUN 2015 due to prostate ca.  Social Determinants of Health   Financial Resource Strain: Low Risk  (05/18/2022)   Overall Financial Resource Strain (CARDIA)    Difficulty of Paying Living Expenses: Not hard at all  Food Insecurity: No Food Insecurity (05/18/2022)   Hunger Vital Sign    Worried About Running Out of Food in the Last Year: Never true    Ran Out of Food in the Last Year: Never true  Transportation Needs: Unmet Transportation Needs (08/13/2022)   PRAPARE - Administrator, Civil Service (Medical): Yes    Lack of Transportation (Non-Medical): Yes  Physical Activity: Insufficiently Active (05/18/2022)   Exercise Vital Sign    Days of Exercise per Week: 3 days    Minutes of Exercise per Session: 20 min  Stress: No Stress Concern Present (05/18/2022)   [employer] of Occupational Health - Occupational Stress Questionnaire    Feeling of Stress : Not at all  Social Connections: Socially Isolated (05/18/2022)   Social Connection and Isolation Panel [NHANES]    Frequency of Communication with Friends and Family: Once a week    Frequency of Social Gatherings with Friends and Family: Once a week    Attends Religious Services: Never    Database administrator or Organizations: No    Attends Banker Meetings: Never    Marital Status: Widowed  Intimate Partner Violence: Not At Risk (05/18/2022)   Humiliation, Afraid, Rape, and Kick questionnaire    Fear of  Current or Ex-Partner: No    Emotionally Abused: No    Physically Abused: No    Sexually Abused: No    PHYSICAL EXAM    GENERAL EXAM/CONSTITUTIONAL: Vitals:  Vitals:   04/15/23 1315  BP: 134/69  Pulse: (!) 101  Weight: 210 lb 8 oz (95.5 kg)  Height: 5\' 7"  (1.702 m)    Body mass index is 32.97 kg/m. Wt Readings from Last 3 Encounters:  04/15/23 210 lb 8 oz (95.5 kg)  03/29/23 216 lb (98 kg)  02/01/23 225 lb 9.6 oz (102.3 kg)   Patient is in no distress; well developed, nourished and groomed; neck is supple  EYES: Loss of peripheral vision bilaterally ,reports vision loss with the left. Extraocular movements intacts   MUSCULOSKELETAL: Gait, strength, tone, movements noted in Neurologic exam below  NEUROLOGIC: MENTAL STATUS:     05/18/2022    3:33 PM  MMSE - Mini Mental State Exam  Not completed: Unable to complete   awake, alert, oriented to person, place and time recent and remote memory intact normal attention and concentration language fluent, comprehension intact, naming intact fund of knowledge appropriate  CRANIAL NERVE:  2nd, 3rd, 4th, 6th - Loss of peripheral vision on the right., no vision on the left. extraocular muscles intact, no nystagmus 5th - facial sensation symmetric 7th - facial strength symmetric 8th - hearing intact 9th - palate elevates symmetrically, uvula midline 11th - shoulder shrug symmetric 12th - tongue protrusion midline  MOTOR:  normal bulk and tone, full strength in the BUE, BLE. RUE shoulder abduction is limited by pain.   SENSORY:  normal and symmetric to light touch, vibration  COORDINATION:  finger-nose-finger, fine finger movements normal  GAIT/STATION:  Normal, negative romberg      DIAGNOSTIC DATA (LABS, IMAGING, TESTING) - I reviewed patient records, labs, notes, testing and imaging myself where available.  Lab Results  Component Value Date   WBC 8.4 09/14/2022   HGB 13.2 09/14/2022   HCT 39.5  09/14/2022  MCV 96.1 09/14/2022   PLT 195 09/14/2022      Component Value Date/Time   NA 137 02/01/2023 1411   K 4.4 02/01/2023 1411   K 4.3 07/12/2012 0755   CL 98 02/01/2023 1411   CL 100 07/12/2012 0755   CO2 22 02/01/2023 1411   GLUCOSE 98 02/01/2023 1411   GLUCOSE 117 (H) 10/28/2022 0856   BUN 22 02/01/2023 1411   CREATININE 1.24 (H) 02/01/2023 1411   CREATININE 1.23 (H) 12/11/2020 1023   CALCIUM 10.3 02/01/2023 1411   CALCIUM 11.0 07/12/2012 0755   PROT 6.7 02/01/2023 1411   PROT 8.1 07/12/2012 0755   ALBUMIN 4.5 02/01/2023 1411   AST 34 02/01/2023 1411   AST 42 07/12/2012 0755   ALT 25 02/01/2023 1411   ALKPHOS 61 02/01/2023 1411   ALKPHOS 32 07/12/2012 0755   BILITOT 0.6 02/01/2023 1411   BILITOT 1.0 07/12/2012 0755   GFRNONAA 47 (L) 10/28/2022 0856   GFRNONAA 36 (L) 07/22/2020 1441   GFRAA 41 (L) 07/22/2020 1441   Lab Results  Component Value Date   CHOL 189 10/28/2022   HDL 26 (L) 10/28/2022   LDLCALC 131 (H) 10/28/2022   TRIG 160 (H) 10/28/2022   CHOLHDL 7.3 10/28/2022   Lab Results  Component Value Date   HGBA1C 5.6 02/01/2023   Lab Results  Component Value Date   VITAMINB12 681 11/24/2016   Lab Results  Component Value Date   TSH 4.580 (H) 02/01/2023    MRI Brain 01/05/22 1. 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 2. 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  ASSESSMENT AND PLAN  72 y.o. year old female who is presenting for headache follow-up.  Her headaches are well-controlled with Maxalt, on average 1-2 headaches per month which Maxalt helps.  Plan for now to continue patient on Maxalt.  We will also start her on low-dose gabapentin at night due to neck muscle strain causing her headaches.  Follow-up in 6 months or sooner if worse.   1. Migraine without aura and without status migrainosus, not intractable     Patient Instructions  Continue Maxalt as needed  for the headaches. If Maxalt no longer effective, will try Nurtec Trial of low dose Gabapentin 100 mg nightly  Continue your other medications  Follow up in 6 months or sooner if worse   No orders of the defined types were placed in this encounter.   Meds ordered this encounter  Medications   gabapentin (NEURONTIN) 100 MG capsule    Sig: Take 1 capsule (100 mg total) by mouth at bedtime.    Dispense:  30 capsule    Refill:  0    Return in about 6 months (around 10/16/2023).    Windell Norfolk, MD 04/15/2023, 2:05 PM  [facility] [address], [address] [place], [place] [address] [phone]

## 2023-04-15 NOTE — Patient Instructions (Addendum)
Continue Maxalt as needed for the headaches. If Maxalt no longer effective, will try Nurtec Trial of low dose Gabapentin 100 mg nightly  Continue your other medications  Follow up in 6 months or sooner if worse

## 2023-04-21 ENCOUNTER — Other Ambulatory Visit: Payer: Self-pay

## 2023-04-21 MED ORDER — FENOFIBRATE 145 MG PO TABS: 145.0000 mg | ORAL_TABLET | Freq: Every day | ORAL | 3 refills | Status: DC

## 2023-04-21 NOTE — Telephone Encounter (Signed)
 Refaxed to pharmacy.

## 2023-04-21 NOTE — Telephone Encounter (Signed)
Patient called still has not yet received this medication yet , patient contacted [facility] and did not have this yet.  Need refills, asked to resend qty: 90 pills

## 2023-04-28 ENCOUNTER — Other Ambulatory Visit (HOSPITAL_COMMUNITY)
Admission: RE | Admit: 2023-04-28 | Discharge: 2023-04-28 | Disposition: A | Discharge status: Home / Self Care | Payer: Medicare Other | Source: Ambulatory Visit | Attending: "Endocrinology | Admitting: "Endocrinology

## 2023-04-28 DIAGNOSIS — E039 Hypothyroidism, unspecified: Secondary | ICD-10-CM | POA: Insufficient documentation

## 2023-04-28 LAB — LIPID PANEL
Cholesterol: 194 mg/dL (ref 0–200)
HDL: 33 mg/dL — ABNORMAL LOW (ref >40)
LDL Cholesterol: 124 mg/dL — ABNORMAL HIGH (ref 0–99)
Total CHOL/HDL Ratio: 5.9 RATIO
Triglycerides: 184 mg/dL — ABNORMAL HIGH (ref <150)
VLDL: 37 mg/dL (ref 0–40)

## 2023-04-28 LAB — TSH: TSH: 2.855 u[IU]/mL (ref 0.350–4.500)

## 2023-04-28 LAB — MAGNESIUM: Magnesium: 1.5 mg/dL — ABNORMAL LOW (ref 1.7–2.4)

## 2023-04-28 LAB — T4, FREE: Free T4: 0.96 ng/dL (ref 0.61–1.12)

## 2023-05-05 ENCOUNTER — Ambulatory Visit (INDEPENDENT_AMBULATORY_CARE_PROVIDER_SITE_OTHER): Payer: Medicare Other | Admitting: "Endocrinology

## 2023-05-05 ENCOUNTER — Encounter: Payer: Self-pay | Admitting: "Endocrinology

## 2023-05-05 DIAGNOSIS — E782 Mixed hyperlipidemia: Secondary | ICD-10-CM | POA: Diagnosis not present

## 2023-05-05 DIAGNOSIS — E039 Hypothyroidism, unspecified: Secondary | ICD-10-CM

## 2023-05-05 MED ORDER — LEVOTHYROXINE SODIUM 25 MCG PO TABS
25.0000 ug | ORAL_TABLET | Freq: Every day | ORAL | 1 refills | Status: DC
Start: 2023-05-05 — End: 2023-11-10

## 2023-05-05 NOTE — Progress Notes (Signed)
05/05/2023, 6:44 PM  Endocrinology follow-up note   Subjective:    Patient ID: [name], female    DOB: 1950-10-13, PCP Anabel Halon, MD   Past Medical History:  Diagnosis Date   Anal fissure    Anxiety    Arthritis    Asthma    C. difficile diarrhea 09/17/2021   Cataract    Phreesia 01/05/2021   Chest pain    a. 02/2000 Cath: nl cors, EF 60%;  b. 10/2010 MV: nl LV, no ischemia/infarct;  c. 03/2014 Admit c/p, r/o->grief from husbands death.   Chronic RUQ pain 2008/05/25   EUS slightly dilated CBD (7.55mm), otherwise nl   Depression    Diverticulosis    Eczema    Exposure to chemical inhalation mid 1970s   Fatty liver    G6PD deficiency    Gallstones    GERD (gastroesophageal reflux disease)    Glaucoma    History of pulmonary embolus (PE)    Treated with Eliquis May 26, 2015   HTN (hypertension)    Hypercholesteremia    a. intolerant to statins.   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 She has limited vision in the right eye.   Migraines    inner ocular   Ocular migraine    Palpitations    Pleurisy    Pneumonia 07/16/2021   Patient was exposed to a harsh chemical in the 1970's that created scar tissue in her lungs. She has had pneumonia several times in the past.   PONV (postoperative nausea and vomiting)    Pulmonary emboli (HCC)    Recurrent upper respiratory infection (URI)    Renal cyst    Retinal cyst    Sickle cell trait (HCC)    Tubular adenoma of colon 09/17/2011   Urticaria    Past Surgical History:  Procedure Laterality Date   ABDOMINAL HYSTERECTOMY     ADENOIDECTOMY     ANGIOPLASTY     APPENDECTOMY     BIOPSY N/A 03/14/2013   Procedure: SMALL BOWEL AND GASTRIC BIOPSIES (Procedure #1);  Surgeon: West Bali, MD;  Location: [facility];  Service: Endoscopy;  Laterality: N/A;   CARDIAC CATHETERIZATION Left  02/11/2000   CATARACT EXTRACTION Left    CATARACT EXTRACTION W/PHACO Right 03/19/2021   Procedure: CATARACT EXTRACTION PHACO AND INTRAOCULAR LENS PLACEMENT (IOC) RIGHT 6.75 00:50.4;  Surgeon: Lockie Mola, MD;  Location: [facility];  Service: Ophthalmology;  Laterality: Right;   CHOLECYSTECTOMY  12/2007   COLONOSCOPY  12/22/2010   ZOX:WRUEAV, cecal adenomatous polyp   COLONOSCOPY WITH PROPOFOL N/A 03/31/2016   Dr. Darrick Penna: four sessile polyps rectum/sigmoid colon, diverticulosi, ext/int hemorrhoids. hyperplastic polyps, next tcs 5 years.   COLONOSCOPY WITH PROPOFOL N/A 04/21/2021   anal fissure, non-bleeding internal hemorrhoids, right colon diverticulosis, one 2 mm polyp in ascending, three 4-6 mm polyps in descending colon. Tubular adenomas. 5 year surveillance.   complete hysterectomy     ENTEROSCOPY N/A 03/14/2013   WUJ:WJXB gastritis/ulcers has healed   ESOPHAGOGASTRODUODENOSCOPY  09/22/2011   JYN:WGNF gastritis/Duodenitis   EXCISIONAL HEMORRHOIDECTOMY     FLEXIBLE  SIGMOIDOSCOPY N/A 03/14/2013   SLF:3 colon polyp removed/moderate sized internal hemorrhoids   FLEXIBLE SIGMOIDOSCOPY N/A 06/09/2016   Procedure: FLEXIBLE SIGMOIDOSCOPY;  Surgeon: West Bali, MD;  Location: [facility];  Service: Endoscopy;  Laterality: N/A;  rectal polyps times 2   FLEXIBLE SIGMOIDOSCOPY N/A 03/18/2018   Procedure: FLEXIBLE SIGMOIDOSCOPY;  Surgeon: West Bali, MD;  Location: [facility];  Service: Endoscopy;  Laterality: N/A;  9:15am   FOOT SURGERY     bunion removal left   GIVENS CAPSULE STUDY N/A 02/10/2013   Procedure: GIVENS CAPSULE STUDY;  Surgeon: West Bali, MD;  Location: [facility];  Service: Endoscopy;  Laterality: N/A;  730   HEEL SPUR EXCISION     right    HEMORRHOID BANDING N/A 03/14/2013   Procedure: HEMORRHOID BANDING (Procedure #3)  3 bands applied [id number] Exp 02/02/2014 ;  Surgeon: West Bali, MD;  Location: [facility];  Service: Endoscopy;   Laterality: N/A;   HEMORRHOID SURGERY N/A 04/24/2016   Procedure: EXTENSIVE HEMORRHOIDECTOMY;  Surgeon: Ancil Linsey, MD;  Location: [facility];  Service: General;  Laterality: N/A;   LAPAROSCOPY     adhesions   POLYPECTOMY N/A 03/14/2013   GNF:AOZH Gastritis . ULCERS SEEN ON MAY 6 HAVE HEALED   POLYPECTOMY  03/31/2016   Procedure: POLYPECTOMY;  Surgeon: West Bali, MD;  Location: [facility];  Service: Endoscopy;;  sigmoid colon polyps x2, rectal polyps x2   POLYPECTOMY  04/21/2021   Procedure: POLYPECTOMY;  Surgeon: Lanelle Bal, DO;  Location: [facility];  Service: Endoscopy;;   RECTAL EXAM UNDER ANESTHESIA N/A 07/23/2021   Procedure: RECTAL EXAM UNDER ANESTHESIA;  Surgeon: Romie Levee, MD;  Location[facility];  Service: General;  Laterality: N/A;   SPHINCTEROTOMY N/A 07/23/2021   Procedure: CHEMICAL SPHINCTEROTOMY;  Surgeon: Romie Levee, MD;  Location: [facility];  Service: General;  Laterality: N/A;   TONSILLECTOMY     TUBAL LIGATION     Social History   Socioeconomic History   Marital status: Widowed    Spouse name: Not on file   Number of children: 1   Years of education: Not on file   Highest education level: Not on file  Occupational History   Occupation: homemaker    Employer: UNEMPLOYED  Tobacco Use   Smoking status: Former    Current packs/day: 0.00    Average packs/day: 0.5 packs/day for 30.0 years (15.0 ttl pk-yrs)    Types: Cigarettes    Start date: 10/15/1966    Quit date: 10/15/1996    Years since quitting: 26.5   Smokeless tobacco: Never   Tobacco comments:    Stopped smoking ~ 2000  Vaping Use   Vaping status: Never Used  Substance and Sexual Activity   Alcohol use: Not Currently    Alcohol/week: 4.0 standard drinks of alcohol    Types: 4 Glasses of wine per week    Comment: 1 drinks every couple of weeks   Drug use: No   Sexual activity: Not Currently    Birth control/protection: Surgical  Other  Topics Concern   Not on file  Social History Narrative   Does not routinely exercise. Husband passed in JUN 2015 due to prostate ca.   Social Determinants of Health   Financial Resource Strain: Low Risk  (05/18/2022)   Overall Financial Resource Strain (CARDIA)    Difficulty of Paying Living Expenses: Not hard at all  Food Insecurity: No Food Insecurity (  05/18/2022)   Hunger Vital Sign    Worried About Running Out of Food in the Last Year: Never true    Ran Out of Food in the Last Year: Never true  Transportation Needs: Unmet Transportation Needs (08/13/2022)   PRAPARE - Transportation    Lack of Transportation (Medical): Yes    Lack of Transportation (Non-Medical): Yes  Physical Activity: Insufficiently Active (05/18/2022)   Exercise Vital Sign    Days of Exercise per Week: 3 days    Minutes of Exercise per Session: 20 min  Stress: No Stress Concern Present (05/18/2022)   [employer] of Occupational Health - Occupational Stress Questionnaire    Feeling of Stress : Not at all  Social Connections: Socially Isolated (05/18/2022)   Social Connection and Isolation Panel [NHANES]    Frequency of Communication with Friends and Family: Once a week    Frequency of Social Gatherings with Friends and Family: Once a week    Attends Religious Services: Never    Database administrator or Organizations: No    Attends Banker Meetings: Never    Marital Status: Widowed   Family History  Problem Relation Age of Onset   Colon cancer Mother        60s   Urticaria Mother    Heart disease Mother    Colon polyps Mother    Cancer Father        oral   Crohn's disease Sister    Cancer Sister        around heart   Colon polyps Sister    Diabetes Brother    Other Brother        intestinal sugery   Colon polyps Brother    Colon cancer Maternal Grandmother    Colon cancer Maternal Grandfather    Colon cancer Paternal Grandfather    Anesthesia problems Neg Hx    Outpatient  Encounter Medications as of 05/05/2023  Medication Sig   albuterol (PROVENTIL) (2.5 MG/3ML) 0.083% nebulizer solution Take 3 mLs (2.5 mg total) by nebulization every 4 (four) hours as needed for wheezing or shortness of breath.   albuterol (VENTOLIN HFA) 108 (90 Base) MCG/ACT inhaler Inhale 2 puffs into the lungs every 4 (four) hours as needed for wheezing.   ALPRAZolam (XANAX) 1 MG tablet Take 1 tablet (1 mg total) by mouth 2 (two) times daily as needed for anxiety. (Patient taking differently: Take 0.5-1 mg by mouth at bedtime.)   brimonidine (ALPHAGAN) 0.2 % ophthalmic solution Place 1 drop into both eyes 2 (two) times daily.   budesonide-formoterol (SYMBICORT) 160-4.5 MCG/ACT inhaler Inhale 2 puffs into the lungs as needed.   Cholecalciferol (VITAMIN D3) 50 MCG (2000 UT) TABS Take 2,000 Units by mouth daily.   dexlansoprazole (DEXILANT) 60 MG capsule Take 1 capsule (60 mg total) by mouth daily.   diclofenac Sodium (VOLTAREN) 1 % GEL Apply 4 g topically 4 (four) times daily. (Patient not taking: Reported on 04/15/2023)   dicyclomine (BENTYL) 10 MG capsule Take one capsule before meals and at bedtime AS NEEDED for abdominal pain and frequent stool. HOLD for constipation (Patient taking differently: Take 10 mg by mouth daily as needed (IBS).)   DUREZOL 0.05 % EMUL Place 1 drop into the right eye daily.   estradiol (ESTRACE VAGINAL) 0.1 MG/GM vaginal cream Place 1 Applicatorful vaginally daily as needed.   fenofibrate (TRICOR) 145 MG tablet Take 1 tablet (145 mg total) by mouth daily.   gabapentin (NEURONTIN) 100 MG capsule Take  1 capsule (100 mg total) by mouth at bedtime.   guaiFENesin-codeine (ROBITUSSIN AC) 100-10 MG/5ML syrup Take 5 mLs by mouth 3 (three) times daily as needed for cough.   hyoscyamine (ANASPAZ) 0.125 MG TBDP disintergrating tablet Place 1 tablet (0.125 mg total) under the tongue daily as needed (IBS). Place 0.125 mg under the tongue daily as needed (IBS).   irbesartan (AVAPRO)  150 MG tablet Take 0.5 tablets (75 mg total) by mouth daily.   latanoprost (XALATAN) 0.005 % ophthalmic solution Place 1 drop into both eyes at bedtime.   levothyroxine (SYNTHROID) 25 MCG tablet Take 1 tablet (25 mcg total) by mouth daily.   Lidocaine-Hydrocortisone Ace 3-2.5 % KIT APPLY TO RECTUM QID for 2 weeks then AS NEEDED FOR RECTAL PAIN OR BLEEDING   Lifitegrast (XIIDRA) 5 % SOLN INSTILL 1 DROP IN BOTH EYES EVERY DAY AS NEEDED (EACH CONTAINER SHOULD YIELD 2 DROPS) STORE IN ORIGINAL FOIL POUCH (Patient taking differently: Place 1 drop into both eyes daily. (EACH CONTAINER SHOULD YIELD 2 DROPS) STORE IN ORIGINAL FOIL POUCH)   Magnesium 500 MG TABS Take 1 tablet by mouth 2 (two) times daily with a meal.   meclizine (ANTIVERT) 12.5 MG tablet Take 1 tablet (12.5 mg total) by mouth 3 (three) times daily as needed for dizziness. (Patient taking differently: Take 12.5 mg by mouth 3 (three) times daily as needed (vertigo).)   Menthol, Topical Analgesic, (BIOFREEZE EX) Apply 1 application topically daily as needed (pain).   methylPREDNISolone (MEDROL DOSEPAK) 4 MG TBPK tablet Take as package instructions.   metroNIDAZOLE (METROCREAM) 0.75 % cream Apply 1 application. topically as needed.   naproxen (NAPROSYN) 500 MG tablet Take 1 tablet (500 mg total) by mouth 2 (two) times daily with a meal.   neomycin-polymyxin b-dexamethasone (MAXITROL) 3.5-10000-0.1 OINT Place 1 application into both eyes at bedtime as needed. (Patient taking differently: Place 1 application  into both eyes at bedtime as needed (Dry eyes).)   nitroGLYCERIN (NITROLINGUAL) 0.4 MG/SPRAY spray Place 1 spray under the tongue every 5 (five) minutes as needed for chest pain.   NON FORMULARY Take 1 tablet by mouth daily as needed (Stomach Bloat). FD GUARD   ondansetron (ZOFRAN) 4 MG tablet Take 1 tablet (4 mg total) by mouth every 8 (eight) hours as needed for nausea or vomiting.   prednisoLONE acetate (PRED FORTE) 1 % ophthalmic suspension  Place 1 drop into the right eye 3 (three) times daily.   Probiotic Product (PROBIOTIC DAILY PO) Take 1 capsule by mouth daily. VSL #3   Rimegepant Sulfate (NURTEC) 75 MG TBDP Take 1 tablet by mouth as needed.   rizatriptan (MAXALT-MLT) 10 MG disintegrating tablet Take 1 tablet (10 mg total) by mouth as needed for migraine. May repeat in 2 hours if needed. (ok to send out this medication to the patient, the patient can tolerate this medication and will be monitored, if this is not acceptable please cancel this script and send in a new erx with a note, thanks)   UNABLE TO FIND Place 1 application rectally daily as needed. [facility]  Med Name: lidocaine 5% pramoxine 1% ointment per rectum as needed.   zolpidem (AMBIEN) 10 MG tablet Take 1 tablet (10 mg total) by mouth at bedtime as needed for sleep.   [DISCONTINUED] levothyroxine (SYNTHROID) 25 MCG tablet Take 1 tablet (25 mcg total) by mouth daily.   No facility-administered encounter medications on file as of 05/05/2023.   ALLERGIES: Allergies  Allergen Reactions   Nutmeg Oil (  Myristica Oil) Anaphylaxis    Nut oil- skin irritation. Nut oil- skin irritation.   Adhesive [Tape] Other (See Comments)    Blisters skin   Aspirin Other (See Comments)    sickle cell trait--  Not recommended unless emergency   Imitrex [Sumatriptan] Hives   Keflex [Cephalexin] Other (See Comments)    G6PD   Levaquin [Levofloxacin In D5w]     Altered mental status Affects G6PD   Other     Nut Oil- skin irritation    Statins Other (See Comments)    Myopathy - elevated CK Myopathy - elevated CK Myopathy-elevated CK   Sulfa Antibiotics Other (See Comments)    Patient has sickle cell trait Sickle cell trait.   Sulfonamide Derivatives Other (See Comments)    Patient has sickle cell trait   Latex Rash    VACCINATION STATUS: Immunization History  Administered Date(s) Administered   Fluad Quad(high Dose 65+) 06/05/2019, 07/22/2020, 06/16/2021,  06/30/2022   Influenza Split 07/12/2012   Influenza,inj,Quad PF,6+ Mos 08/03/2014, 07/19/2015, 08/11/2016, 06/24/2017, 07/04/2018   Moderna Covid-19 Vaccine Bivalent Booster 23yrs & up 01/31/2021, 06/30/2021, 01/30/2022   Moderna SARS-COV2 Booster Vaccination 06/30/2022   Moderna Sars-Covid-2 Vaccination 11/16/2019, 12/15/2019, 08/02/2020   Pneumococcal Conjugate-13 02/15/2018   Pneumococcal Polysaccharide-23 08/03/2014, 01/12/2020   Rsv, Bivalent, Protein Subunit Rsvpref,pf Verdis Frederickson) 06/30/2022   Tdap 08/25/2012, 08/03/2014   Zoster Recombinant(Shingrix) 01/16/2020, 03/21/2020   Zoster, Live 08/25/2012    HPI   BRYLAN FEBLES is 72 y.o. female who presents today with a medical history as above. she is being seen in follow-up  for mild hypothyroidism on low-dose levothyroxine 25 mg p.o. daily before breakfast.  She reports compliance and consistency, her previsit thyroid function tests are consistent with appropriate replacement.  Her previous mild hypercalcemia has resolved.  She is also on vitamin D deficiency.  She is  on regular supplement of magnesium ([phone] 808-1567 mg po q day.  Her previsit labs show inadequate correction for hypomagnesemia.   She continues to have stable calcium level after she was advised to discontinue hydrochlorothiazide.    She has no new complaints today.  She continues to feel better.  She denies any prior history of parathyroid/pituitary/adrenal dysfunction. -She does have a family history of some thyroid dysfunction in her cousins.   She is on multiple vitamin supplements/medications including inhalers for asthma.  Review of Systems Limited as above.  Objective:       05/05/2023    1:36 PM 04/15/2023    1:15 PM 03/29/2023    9:44 AM  Vitals with BMI  Height 5\' 7"  5\' 7"  5\' 7"   Weight 217 lbs 210 lbs 8 oz 216 lbs  BMI 33.98 32.96 33.82  Systolic 126 134 92  Diastolic 74 69 62  Pulse 92 101 98    BP 126/74   Pulse 92   Ht 5\' 7"  (1.702 m)   Wt  217 lb (98.4 kg)   BMI 33.99 kg/m   Wt Readings from Last 3 Encounters:  05/05/23 217 lb (98.4 kg)  04/15/23 210 lb 8 oz (95.5 kg)  03/29/23 216 lb (98 kg)    Physical Exam    CMP ( most recent) CMP     Component Value Date/Time   NA 137 02/01/2023 1411   K 4.4 02/01/2023 1411   K 4.3 07/12/2012 0755   CL 98 02/01/2023 1411   CL 100 07/12/2012 0755   CO2 22 02/01/2023 1411   GLUCOSE 98 02/01/2023 1411   GLUCOSE  117 (H) 10/28/2022 0856   BUN 22 02/01/2023 1411   CREATININE 1.24 (H) 02/01/2023 1411   CREATININE 1.23 (H) 12/11/2020 1023   CALCIUM 10.3 02/01/2023 1411   CALCIUM 11.0 07/12/2012 0755   PROT 6.7 02/01/2023 1411   PROT 8.1 07/12/2012 0755   ALBUMIN 4.5 02/01/2023 1411   AST 34 02/01/2023 1411   AST 42 07/12/2012 0755   ALT 25 02/01/2023 1411   ALKPHOS 61 02/01/2023 1411   ALKPHOS 32 07/12/2012 0755   BILITOT 0.6 02/01/2023 1411   BILITOT 1.0 07/12/2012 0755   GFRNONAA 47 (L) 10/28/2022 0856   GFRNONAA 36 (L) 07/22/2020 1441   GFRAA 41 (L) 07/22/2020 1441     Diabetic Labs (most recent): Lab Results  Component Value Date   HGBA1C 5.6 02/01/2023   HGBA1C 5.2 01/20/2022   HGBA1C 5.1 12/11/2020   MICROALBUR 16.6 01/04/2017     Lipid Panel ( most recent) Lipid Panel     Component Value Date/Time   CHOL 194 04/28/2023 0830   CHOL 205 (H) 01/20/2022 1433   CHOL 254 07/12/2012 0755   TRIG 184 (H) 04/28/2023 0830   TRIG 325 07/12/2012 0755   HDL 33 (L) 04/28/2023 0830   HDL 26 (L) 01/20/2022 1433   CHOLHDL 5.9 04/28/2023 0830   VLDL 37 04/28/2023 0830   LDLCALC 124 (H) 04/28/2023 0830   LDLCALC 139 (H) 01/20/2022 1433   LDLCALC 156 (H) 12/11/2020 1023   LABVLDL 40 01/20/2022 1433      Lab Results  Component Value Date   TSH 2.855 04/28/2023   TSH 4.580 (H) 02/01/2023   TSH 2.248 10/28/2022   TSH 2.306 04/27/2022   TSH 2.200 09/08/2021   TSH 1.400 03/24/2021   TSH 1.770 09/23/2020   TSH 1.710 05/24/2020   TSH 5.12 (H) 01/12/2020    TSH 1.66 11/24/2016   FREET4 0.96 04/28/2023   FREET4 1.08 02/01/2023   FREET4 1.19 (H) 10/28/2022   FREET4 1.04 04/27/2022   FREET4 1.22 09/08/2021   FREET4 1.36 03/24/2021   FREET4 1.46 09/23/2020   FREET4 1.31 05/24/2020   FREET4 1.3 01/12/2020   FREET4 1.19 08/03/2014      Assessment & Plan:   1. Hypercalcemia  2.  Hypomagnesemia   3.  Hypothyroidism   4.  Hyperlipidemia   5.  Obesity -She maintains normocalcemia  after her HCTZ was discontinued.     Her current calcium level at 10 is consistent with normocalcemia.  She is advised to resume her magnesium 200 mg p.o. twice daily-with breakfast and supper.  3.  Hypothyroidism -Her previsit thyroid function tests are consistent with appropriate replacement.  She is advised to continue levothyroxine 25 mcg p.o. daily before breakfast.  - We discussed about the correct intake of her thyroid hormone, on empty stomach at fasting, with water, separated by at least 30 minutes from breakfast and other medications,  and separated by more than 4 hours from calcium, iron, multivitamins, acid reflux medications (PPIs). -Patient is made aware of the fact that thyroid hormone replacement is needed for life, dose to be adjusted by periodic monitoring of thyroid function tests.   -Her neck exam is negative for goiter, no need for thyroid imaging for now. Her supraclavicular fullness is due to increased intrathoracic pressure due to asthma/COPD.   She has dyslipidemia, obesity, statin intolerance.  She is advised to continue her ezetimibe 10 mg p.o. daily at bedtime.  Her LDL is still high at 124 also slightly improving from  160.  Her engagement with whole food plant-based diet is not optimal, however would like to engage in the interest of avoiding statin treatment.  She does not tolerate statin medication.  If her next measurement of LDL is above 100, this patient should be considered for Repatha.  - she is advised to maintain close follow up with  Anabel Halon, MD for primary care needs.   I spent  26  minutes in the care of the patient today including review of labs from Thyroid Function, CMP, and other relevant labs ; imaging/biopsy records (current and previous including abstractions from other facilities); face-to-face time discussing  her lab results and symptoms, medications doses, her options of short and long term treatment based on the latest standards of care / guidelines;   and documenting the encounter.  Nyveah Jungmann Dedmon  participated in the discussions, expressed understanding, and voiced agreement with the above plans.  All questions were answered to her satisfaction. she is encouraged to contact clinic should she have any questions or concerns prior to her return visit.   Follow up plan: Return in about 6 months (around 11/05/2023) for Fasting Labs  in AM B4 8, A1c -NV.   Marquis Lunch, MD [facility] [address] [place], [place] [address] Phone: [phone]  Fax: [phone]     05/05/2023, 6:44 PM  This note was partially dictated with voice recognition software. Similar sounding words can be transcribed inadequately or may not  be corrected upon review.

## 2023-05-05 NOTE — Patient Instructions (Signed)

## 2023-06-03 ENCOUNTER — Ambulatory Visit (INDEPENDENT_AMBULATORY_CARE_PROVIDER_SITE_OTHER): Payer: Medicare Other | Admitting: Internal Medicine

## 2023-06-03 ENCOUNTER — Encounter: Payer: Self-pay | Admitting: Internal Medicine

## 2023-06-03 VITALS — BP 111/72 | HR 78 | Ht 67.0 in | Wt 215.0 lb

## 2023-06-03 DIAGNOSIS — I10 Essential (primary) hypertension: Secondary | ICD-10-CM

## 2023-06-03 DIAGNOSIS — H401132 Primary open-angle glaucoma, bilateral, moderate stage: Secondary | ICD-10-CM

## 2023-06-03 DIAGNOSIS — J4530 Mild persistent asthma, uncomplicated: Secondary | ICD-10-CM

## 2023-06-03 DIAGNOSIS — F5104 Psychophysiologic insomnia: Secondary | ICD-10-CM

## 2023-06-03 DIAGNOSIS — E039 Hypothyroidism, unspecified: Secondary | ICD-10-CM

## 2023-06-03 DIAGNOSIS — F419 Anxiety disorder, unspecified: Secondary | ICD-10-CM

## 2023-06-03 DIAGNOSIS — E785 Hyperlipidemia, unspecified: Secondary | ICD-10-CM | POA: Diagnosis not present

## 2023-06-03 DIAGNOSIS — M5137 Other intervertebral disc degeneration, lumbosacral region: Secondary | ICD-10-CM

## 2023-06-03 DIAGNOSIS — Z23 Encounter for immunization: Secondary | ICD-10-CM | POA: Diagnosis not present

## 2023-06-03 DIAGNOSIS — H547 Unspecified visual loss: Secondary | ICD-10-CM | POA: Insufficient documentation

## 2023-06-03 MED ORDER — HYDROCOD POLI-CHLORPHE POLI ER 10-8 MG/5ML PO SUER
5.0000 mL | Freq: Two times a day (BID) | ORAL | 0 refills | Status: DC | PRN
Start: 2023-06-03 — End: 2023-10-04

## 2023-06-03 MED ORDER — HYDROCODONE-ACETAMINOPHEN 5-325 MG PO TABS
1.0000 | ORAL_TABLET | Freq: Three times a day (TID) | ORAL | 0 refills | Status: DC | PRN
Start: 2023-06-03 — End: 2023-09-16

## 2023-06-03 NOTE — Assessment & Plan Note (Signed)
H/o panic disorder as well Well-controlled with Xanax Xanax 1 mg BID PRN, takes it only for panic episodes

## 2023-06-03 NOTE — Assessment & Plan Note (Signed)
Recently had asthma exacerbation or acute bronchitis Completed Medrol dosepak On Symbicort, had trial of Trelegy as she had severe symptoms despite using Symbicort, but did not respond well Albuterol PRN for dyspnea or wheezing Refilled albuterol nebulizer as well as she responds better to nebulization Tussionex PRN for cough

## 2023-06-03 NOTE — Assessment & Plan Note (Signed)
Followed by ophthalmology Has latanoprost eyedrops Her recent changes in vision could be due to macular degeneration - needs an ophthalmology evaluation

## 2023-06-03 NOTE — Assessment & Plan Note (Signed)
On fenofibrate ?Check lipid profile ?

## 2023-06-03 NOTE — Patient Instructions (Signed)
Please take Tussionex for cough.  Please continue to take medications as prescribed.  Please continue to follow low carb diet and ambulate as tolerated.

## 2023-06-03 NOTE — Assessment & Plan Note (Signed)
  BP Readings from Last 1 Encounters:  06/03/23 111/72   Well-controlled, actually low normal with Irbesartan 75 mg every day -advised to hold irbesartan if she has dizziness Counseled for compliance with the medications Advised DASH diet and moderate exercise/walking as tolerated

## 2023-06-03 NOTE — Assessment & Plan Note (Addendum)
Lab Results  Component Value Date   TSH 2.855 04/28/2023   Was on Levothyroxine, was recently stopped due to borderline elevated free T4 by Dr Fransico Him, but has had weight gain and fatigue since stopping it,  restarted Levothyroxine 25 mcg again in the last visit Follows up with Dr Fransico Him Checked TSH and free T4

## 2023-06-03 NOTE — Assessment & Plan Note (Signed)
Avoid heavy lifting and frequent bending Advised to use back brace and/or heating pad as needed Norco as needed for severe pain

## 2023-06-03 NOTE — Progress Notes (Signed)
Established Patient Office Visit  Subjective:  Patient ID: [name], female    DOB: 29-Mar-1951  Age: 72 y.o. MRN: [id number]  CC:  Chief Complaint  Patient presents with   Follow-up    4 month  Patient is having soreness in her right elbow for about 2-3wks now. She also states she is having lower back pain as well     HPI [name] is a 72 y.o. female with past medical history of HTN, GERD, IBS-C, NASH, anal fissure, hypothyroidism, OA, DDD of lumbar spine, anxiety/panic disorder, insomnia, left sided blind eye, G6PD mutation and sickle cell trait who presents for f/u of her chronic medical conditions.  HTN: BP is well-controlled. Takes medications regularly. Patient denies dizziness, chest pain, dyspnea or palpitations.  She has noticed an improvement in daily headache recently, which is generalized, and worse with stress.  She takes Maxalt as needed for migraine.  She had neurology evaluation and and was given gabapentin, but she did not try it after reading its side effects.  She has tried Tylenol for it with some relief. Denies any focal numbness or tingling.  She reports that her headache episode started when she was taken off of levothyroxine, which she was placed on again in the last visit. She has Xanax for severe anxiety/panic episode, but does not require it frequently.  She denies anhedonia, SI or HI. She takes Ambien for insomnia.  She reports recent worsening of low back pain since 05/20/23, which is intermittent, dull, nonradiating and is worse with movement.  She has history of DDD of lumbosacral spine.  She has tried taking Norco for severe pain with relief.  Denies any recent injury or fall.  Denies saddle anesthesia, urinary or stool incontinence.  She has very limited vision in the right eye after cataract surgery.  She has been having difficulty ambulating even at home, but denies any recent fall.  She has recently noticed peripheral vision loss and has gray  spot in the center.  She is followed by ophthalmology.  Denies any eye pain or discharge currently.      Past Medical History:  Diagnosis Date   Anal fissure    Anxiety    Arthritis    Asthma    C. difficile diarrhea 09/17/2021   Cataract    Phreesia 01/05/2021   Chest pain    a. 02/2000 Cath: nl cors, EF 60%;  b. 10/2010 MV: nl LV, no ischemia/infarct;  c. 03/2014 Admit c/p, r/o->grief from husbands death.   Chronic RUQ pain 07-Jun-2008   EUS slightly dilated CBD (7.54mm), otherwise nl   Depression    Diverticulosis    Eczema    Exposure to chemical inhalation mid 1970s   Fatty liver    G6PD deficiency    Gallstones    GERD (gastroesophageal reflux disease)    Glaucoma    History of pulmonary embolus (PE)    Treated with Eliquis June 08, 2015   HTN (hypertension)    Hypercholesteremia    a. intolerant to statins.   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 She has limited vision in the right eye.   Migraines    inner ocular   Ocular migraine    Palpitations    Pleurisy    Pneumonia 07/16/2021   Patient was exposed to a harsh chemical in the 1970's that created  scar tissue in her lungs. She has had pneumonia several times in the past.   PONV (postoperative nausea and vomiting)    Pulmonary emboli (HCC)    Recurrent upper respiratory infection (URI)    Renal cyst    Retinal cyst    Sickle cell trait (HCC)    Tubular adenoma of colon 09/17/2011   Urticaria     Past Surgical History:  Procedure Laterality Date   ABDOMINAL HYSTERECTOMY     ADENOIDECTOMY     ANGIOPLASTY     APPENDECTOMY     BIOPSY N/A 03/14/2013   Procedure: SMALL BOWEL AND GASTRIC BIOPSIES (Procedure #1);  Surgeon: West Bali, MD;  Location: [facility];  Service: Endoscopy;  Laterality: N/A;   CARDIAC CATHETERIZATION Left 02/11/2000   CATARACT EXTRACTION Left    CATARACT EXTRACTION W/PHACO Right 03/19/2021   Procedure:  CATARACT EXTRACTION PHACO AND INTRAOCULAR LENS PLACEMENT (IOC) RIGHT 6.75 00:50.4;  Surgeon: Lockie Mola, MD;  Location: [facility];  Service: Ophthalmology;  Laterality: Right;   CHOLECYSTECTOMY  12/2007   COLONOSCOPY  12/22/2010   QIH:[id number], cecal adenomatous polyp   COLONOSCOPY WITH PROPOFOL N/A 03/31/2016   Dr. Darrick Penna: four sessile polyps rectum/sigmoid colon, diverticulosi, ext/int hemorrhoids. hyperplastic polyps, next tcs 5 years.   COLONOSCOPY WITH PROPOFOL N/A 04/21/2021   anal fissure, non-bleeding internal hemorrhoids, right colon diverticulosis, one 2 mm polyp in ascending, three 4-6 mm polyps in descending colon. Tubular adenomas. 5 year surveillance.   complete hysterectomy     ENTEROSCOPY N/A 03/14/2013   DGL:OVFI gastritis/ulcers has healed   ESOPHAGOGASTRODUODENOSCOPY  09/22/2011   EPP:IRJJ gastritis/Duodenitis   EXCISIONAL HEMORRHOIDECTOMY     FLEXIBLE SIGMOIDOSCOPY N/A 03/14/2013   SLF:3 colon polyp removed/moderate sized internal hemorrhoids   FLEXIBLE SIGMOIDOSCOPY N/A 06/09/2016   Procedure: FLEXIBLE SIGMOIDOSCOPY;  Surgeon: West Bali, MD;  Location: [facility];  Service: Endoscopy;  Laterality: N/A;  rectal polyps times 2   FLEXIBLE SIGMOIDOSCOPY N/A 03/18/2018   Procedure: FLEXIBLE SIGMOIDOSCOPY;  Surgeon: West Bali, MD;  Location: [facility];  Service: Endoscopy;  Laterality: N/A;  9:15am   FOOT SURGERY     bunion removal left   GIVENS CAPSULE STUDY N/A 02/10/2013   Procedure: GIVENS CAPSULE STUDY;  Surgeon: West Bali, MD;  Location: [facility];  Service: Endoscopy;  Laterality: N/A;  730   HEEL SPUR EXCISION     right    HEMORRHOID BANDING N/A 03/14/2013   Procedure: HEMORRHOID BANDING (Procedure #3)  3 bands applied OAC#[id number] Exp 02/02/2014 ;  Surgeon: West Bali, MD;  Location: [facility];  Service: Endoscopy;  Laterality: N/A;   HEMORRHOID SURGERY N/A 04/24/2016   Procedure: EXTENSIVE HEMORRHOIDECTOMY;  Surgeon:  Ancil Linsey, MD;  Location: [facility];  Service: General;  Laterality: N/A;   LAPAROSCOPY     adhesions   POLYPECTOMY N/A 03/14/2013   SWF:UXNA Gastritis . ULCERS SEEN ON MAY 6 HAVE HEALED   POLYPECTOMY  03/31/2016   Procedure: POLYPECTOMY;  Surgeon: West Bali, MD;  Location: [facility];  Service: Endoscopy;;  sigmoid colon polyps x2, rectal polyps x2   POLYPECTOMY  04/21/2021   Procedure: POLYPECTOMY;  Surgeon: Lanelle Bal, DO;  Location: [facility];  Service: Endoscopy;;   RECTAL EXAM UNDER ANESTHESIA N/A 07/23/2021   Procedure: RECTAL EXAM UNDER ANESTHESIA;  Surgeon: Romie Levee, MD;  Location: [facility];  Service: General;  Laterality: N/A;   SPHINCTEROTOMY N/A 07/23/2021   Procedure:  CHEMICAL SPHINCTEROTOMY;  Surgeon: Romie Levee, MD;  Location: [facility];  Service: General;  Laterality: N/A;   TONSILLECTOMY     TUBAL LIGATION      Family History  Problem Relation Age of Onset   Colon cancer Mother        18s   Urticaria Mother    Heart disease Mother    Colon polyps Mother    Cancer Father        oral   Crohn's disease Sister    Cancer Sister        around heart   Colon polyps Sister    Diabetes Brother    Other Brother        intestinal sugery   Colon polyps Brother    Colon cancer Maternal Grandmother    Colon cancer Maternal Grandfather    Colon cancer Paternal Grandfather    Anesthesia problems Neg Hx     Social History   Socioeconomic History   Marital status: Widowed    Spouse name: Not on file   Number of children: 1   Years of education: Not on file   Highest education level: Not on file  Occupational History   Occupation: homemaker    Employer: UNEMPLOYED  Tobacco Use   Smoking status: Former    Current packs/day: 0.00    Average packs/day: 0.5 packs/day for 30.0 years (15.0 ttl pk-yrs)    Types: Cigarettes    Start date: 10/15/1966    Quit date: 10/15/1996    Years since quitting: 26.6    Smokeless tobacco: Never   Tobacco comments:    Stopped smoking ~ 2000  Vaping Use   Vaping status: Never Used  Substance and Sexual Activity   Alcohol use: Not Currently    Alcohol/week: 4.0 standard drinks of alcohol    Types: 4 Glasses of wine per week    Comment: 1 drinks every couple of weeks   Drug use: No   Sexual activity: Not Currently    Birth control/protection: Surgical  Other Topics Concern   Not on file  Social History Narrative   Does not routinely exercise. Husband passed in JUN 2015 due to prostate ca.   Social Determinants of Health   Financial Resource Strain: Low Risk  (05/18/2022)   Overall Financial Resource Strain (CARDIA)    Difficulty of Paying Living Expenses: Not hard at all  Food Insecurity: No Food Insecurity (05/18/2022)   Hunger Vital Sign    Worried About Running Out of Food in the Last Year: Never true    Ran Out of Food in the Last Year: Never true  Transportation Needs: Unmet Transportation Needs (08/13/2022)   PRAPARE - Administrator, Civil Service (Medical): Yes    Lack of Transportation (Non-Medical): Yes  Physical Activity: Insufficiently Active (05/18/2022)   Exercise Vital Sign    Days of Exercise per Week: 3 days    Minutes of Exercise per Session: 20 min  Stress: No Stress Concern Present (05/18/2022)   [employer] of Occupational Health - Occupational Stress Questionnaire    Feeling of Stress : Not at all  Social Connections: Socially Isolated (05/18/2022)   Social Connection and Isolation Panel [NHANES]    Frequency of Communication with Friends and Family: Once a week    Frequency of Social Gatherings with Friends and Family: Once a week    Attends Religious Services: Never    Database administrator or Organizations: No  Attends Banker Meetings: Never    Marital Status: Widowed  Intimate Partner Violence: Not At Risk (05/18/2022)   Humiliation, Afraid, Rape, and Kick questionnaire    Fear of  Current or Ex-Partner: No    Emotionally Abused: No    Physically Abused: No    Sexually Abused: No    Outpatient Medications Prior to Visit  Medication Sig Dispense Refill   albuterol (PROVENTIL) (2.5 MG/3ML) 0.083% nebulizer solution Take 3 mLs (2.5 mg total) by nebulization every 4 (four) hours as needed for wheezing or shortness of breath. 150 mL 3   albuterol (VENTOLIN HFA) 108 (90 Base) MCG/ACT inhaler Inhale 2 puffs into the lungs every 4 (four) hours as needed for wheezing. 54 g 3   ALPRAZolam (XANAX) 1 MG tablet Take 1 tablet (1 mg total) by mouth 2 (two) times daily as needed for anxiety. (Patient taking differently: Take 0.5-1 mg by mouth at bedtime.) 180 tablet 3   brimonidine (ALPHAGAN) 0.2 % ophthalmic solution Place 1 drop into both eyes 2 (two) times daily. 15 mL 3   budesonide-formoterol (SYMBICORT) 160-4.5 MCG/ACT inhaler Inhale 2 puffs into the lungs as needed. 10.2 g 3   Cholecalciferol (VITAMIN D3) 50 MCG (2000 UT) TABS Take 2,000 Units by mouth daily.     dexlansoprazole (DEXILANT) 60 MG capsule Take 1 capsule (60 mg total) by mouth daily. 90 capsule 3   diclofenac Sodium (VOLTAREN) 1 % GEL Apply 4 g topically 4 (four) times daily. 100 g 2   DUREZOL 0.05 % EMUL Place 1 drop into the right eye daily.     estradiol (ESTRACE VAGINAL) 0.1 MG/GM vaginal cream Place 1 Applicatorful vaginally daily as needed. 42.5 g 3   fenofibrate (TRICOR) 145 MG tablet Take 1 tablet (145 mg total) by mouth daily. 90 tablet 3   hyoscyamine (ANASPAZ) 0.125 MG TBDP disintergrating tablet Place 1 tablet (0.125 mg total) under the tongue daily as needed (IBS). Place 0.125 mg under the tongue daily as needed (IBS). 30 tablet 0   irbesartan (AVAPRO) 150 MG tablet Take 0.5 tablets (75 mg total) by mouth daily. 30 tablet 0   latanoprost (XALATAN) 0.005 % ophthalmic solution Place 1 drop into both eyes at bedtime. 9 mL 3   levothyroxine (SYNTHROID) 25 MCG tablet Take 1 tablet (25 mcg total) by mouth  daily. 90 tablet 1   Lidocaine-Hydrocortisone Ace 3-2.5 % KIT APPLY TO RECTUM QID for 2 weeks then AS NEEDED FOR RECTAL PAIN OR BLEEDING 1 kit 11   Lifitegrast (XIIDRA) 5 % SOLN INSTILL 1 DROP IN BOTH EYES EVERY DAY AS NEEDED (EACH CONTAINER SHOULD YIELD 2 DROPS) STORE IN ORIGINAL FOIL POUCH (Patient taking differently: Place 1 drop into both eyes daily. (EACH CONTAINER SHOULD YIELD 2 DROPS) STORE IN ORIGINAL FOIL POUCH) 60 each 3   Magnesium 500 MG TABS Take 1 tablet by mouth 2 (two) times daily with a meal.     meclizine (ANTIVERT) 12.5 MG tablet Take 1 tablet (12.5 mg total) by mouth 3 (three) times daily as needed for dizziness. (Patient taking differently: Take 12.5 mg by mouth 3 (three) times daily as needed (vertigo).) 30 tablet 3   Menthol, Topical Analgesic, (BIOFREEZE EX) Apply 1 application topically daily as needed (pain).     metroNIDAZOLE (METROCREAM) 0.75 % cream Apply 1 application. topically as needed. 45 g 3   naproxen (NAPROSYN) 500 MG tablet Take 1 tablet (500 mg total) by mouth 2 (two) times daily with a meal. 60  tablet 5   neomycin-polymyxin b-dexamethasone (MAXITROL) 3.5-10000-0.1 OINT Place 1 application into both eyes at bedtime as needed. (Patient taking differently: Place 1 application  into both eyes at bedtime as needed (Dry eyes).) 10.5 g 3   nitroGLYCERIN (NITROLINGUAL) 0.4 MG/SPRAY spray Place 1 spray under the tongue every 5 (five) minutes as needed for chest pain. 12 g 3   NON FORMULARY Take 1 tablet by mouth daily as needed (Stomach Bloat). FD GUARD     ondansetron (ZOFRAN) 4 MG tablet Take 1 tablet (4 mg total) by mouth every 8 (eight) hours as needed for nausea or vomiting. 30 tablet 1   prednisoLONE acetate (PRED FORTE) 1 % ophthalmic suspension Place 1 drop into the right eye 3 (three) times daily.     Probiotic Product (PROBIOTIC DAILY PO) Take 1 capsule by mouth daily. VSL #3     Rimegepant Sulfate (NURTEC) 75 MG TBDP Take 1 tablet by mouth as needed.      rizatriptan (MAXALT-MLT) 10 MG disintegrating tablet Take 1 tablet (10 mg total) by mouth as needed for migraine. May repeat in 2 hours if needed. (ok to send out this medication to the patient, the patient can tolerate this medication and will be monitored, if this is not acceptable please cancel this script and send in a new erx with a note, thanks) 9 tablet 6   UNABLE TO FIND Place 1 application rectally daily as needed. [facility]  Med Name: lidocaine 5% pramoxine 1% ointment per rectum as needed.     zolpidem (AMBIEN) 10 MG tablet Take 1 tablet (10 mg total) by mouth at bedtime as needed for sleep. 90 tablet 1   methylPREDNISolone (MEDROL DOSEPAK) 4 MG TBPK tablet Take as package instructions. 1 each 0   dicyclomine (BENTYL) 10 MG capsule Take one capsule before meals and at bedtime AS NEEDED for abdominal pain and frequent stool. HOLD for constipation (Patient not taking: Reported on 06/03/2023) 120 capsule 1   gabapentin (NEURONTIN) 100 MG capsule Take 1 capsule (100 mg total) by mouth at bedtime. 30 capsule 0   guaiFENesin-codeine (ROBITUSSIN AC) 100-10 MG/5ML syrup Take 5 mLs by mouth 3 (three) times daily as needed for cough. (Patient not taking: Reported on 06/03/2023) 120 mL 0   No facility-administered medications prior to visit.    Allergies  Allergen Reactions   Nutmeg Oil (Myristica Oil) Anaphylaxis    Nut oil- skin irritation. Nut oil- skin irritation.   Adhesive [Tape] Other (See Comments)    Blisters skin   Aspirin Other (See Comments)    sickle cell trait--  Not recommended unless emergency   Imitrex [Sumatriptan] Hives   Keflex [Cephalexin] Other (See Comments)    G6PD   Levaquin [Levofloxacin In D5w]     Altered mental status Affects G6PD   Other     Nut Oil- skin irritation    Statins Other (See Comments)    Myopathy - elevated CK Myopathy - elevated CK Myopathy-elevated CK   Sulfa Antibiotics Other (See Comments)    Patient has sickle cell  trait Sickle cell trait.   Sulfonamide Derivatives Other (See Comments)    Patient has sickle cell trait   Latex Rash    ROS Review of Systems  Constitutional:  Negative for chills and fever.  HENT:  Negative for congestion, sinus pressure, sinus pain and sore throat.   Eyes:  Positive for photophobia, pain and visual disturbance. Negative for discharge.  Respiratory:  Positive for cough. Negative for  shortness of breath and wheezing.   Cardiovascular:  Negative for chest pain and palpitations.  Gastrointestinal:  Positive for constipation. Negative for abdominal pain, blood in stool, diarrhea, nausea and vomiting.  Endocrine: Negative for polydipsia and polyuria.  Genitourinary:  Negative for dysuria and hematuria.  Musculoskeletal:  Positive for arthralgias, back pain, neck pain and neck stiffness.  Skin:  Negative for rash.  Neurological:  Positive for headaches. Negative for dizziness and weakness.  Psychiatric/Behavioral:  Negative for agitation and behavioral problems.       Objective:    Physical Exam Vitals reviewed.  Constitutional:      General: She is not in acute distress.    Appearance: She is not diaphoretic.  HENT:     Head: Normocephalic and atraumatic.     Nose: Nose normal.     Mouth/Throat:     Mouth: Mucous membranes are moist.  Eyes:     General: No scleral icterus.    Extraocular Movements: Extraocular movements intact.  Cardiovascular:     Rate and Rhythm: Normal rate and regular rhythm.     Heart sounds: Normal heart sounds. No murmur heard. Pulmonary:     Breath sounds: Normal breath sounds. No wheezing or rales.  Musculoskeletal:        General: Tenderness (Lower lumbar spine area) present.     Left hand: Decreased strength of thumb/finger opposition.     Cervical back: Neck supple. No tenderness.     Right lower leg: No edema.     Left lower leg: No edema.  Skin:    General: Skin is warm.     Findings: No rash.  Neurological:      General: No focal deficit present.     Mental Status: She is alert and oriented to person, place, and time.     Sensory: No sensory deficit.     Motor: No weakness.  Psychiatric:        Mood and Affect: Mood normal.        Behavior: Behavior normal.     BP 111/72   Pulse 78   Ht 5\' 7"  (1.702 m)   Wt 215 lb (97.5 kg)   SpO2 94%   BMI 33.67 kg/m  Wt Readings from Last 3 Encounters:  06/03/23 215 lb (97.5 kg)  05/05/23 217 lb (98.4 kg)  04/15/23 210 lb 8 oz (95.5 kg)    Lab Results  Component Value Date   TSH 2.855 04/28/2023   Lab Results  Component Value Date   WBC 8.4 09/14/2022   HGB 13.2 09/14/2022   HCT 39.5 09/14/2022   MCV 96.1 09/14/2022   PLT 195 09/14/2022   Lab Results  Component Value Date   NA 137 02/01/2023   K 4.4 02/01/2023   CO2 22 02/01/2023   GLUCOSE 98 02/01/2023   BUN 22 02/01/2023   CREATININE 1.24 (H) 02/01/2023   BILITOT 0.6 02/01/2023   ALKPHOS 61 02/01/2023   AST 34 02/01/2023   ALT 25 02/01/2023   PROT 6.7 02/01/2023   ALBUMIN 4.5 02/01/2023   CALCIUM 10.3 02/01/2023   ANIONGAP 9 10/28/2022   EGFR 47 (L) 02/01/2023   Lab Results  Component Value Date   CHOL 194 04/28/2023   Lab Results  Component Value Date   HDL 33 (L) 04/28/2023   Lab Results  Component Value Date   LDLCALC 124 (H) 04/28/2023   Lab Results  Component Value Date   TRIG 184 (H) 04/28/2023   Lab Results  Component Value Date   CHOLHDL 5.9 04/28/2023   Lab Results  Component Value Date   HGBA1C 5.6 02/01/2023      Assessment & Plan:   Problem List Items Addressed This Visit       Cardiovascular and Mediastinum   Essential hypertension, benign - Primary     BP Readings from Last 1 Encounters:  06/03/23 111/72   Well-controlled, actually low normal with Irbesartan 75 mg every day -advised to hold irbesartan if she has dizziness Counseled for compliance with the medications Advised DASH diet and moderate exercise/walking as tolerated         Respiratory   Asthma    Recently had asthma exacerbation or acute bronchitis Completed Medrol dosepak On Symbicort, had trial of Trelegy as she had severe symptoms despite using Symbicort, but did not respond well Albuterol PRN for dyspnea or wheezing Refilled albuterol nebulizer as well as she responds better to nebulization Tussionex PRN for cough      Relevant Medications   chlorpheniramine-HYDROcodone (TUSSIONEX) 10-8 MG/5ML     Endocrine   Hypothyroidism    Lab Results  Component Value Date   TSH 2.855 04/28/2023   Was on Levothyroxine, was recently stopped due to borderline elevated free T4 by Dr Fransico Him, but has had weight gain and fatigue since stopping it,  restarted Levothyroxine 25 mcg again in the last visit Follows up with Dr Fransico Him Checked TSH and free T4        Musculoskeletal and Integument   DDD (degenerative disc disease), lumbosacral    Avoid heavy lifting and frequent bending Advised to use back brace and/or heating pad as needed Norco as needed for severe pain      Relevant Medications   HYDROcodone-acetaminophen (NORCO) 5-325 MG tablet     Other   Anxiety (Chronic)    H/o panic disorder as well Well-controlled with Xanax Xanax 1 mg BID PRN, takes it only for panic episodes      Chronic insomnia (Chronic)    Well-controlled Takes Ambien 10 mg qHS PRN      Hyperlipidemia    On fenofibrate Check lipid profile      Primary open angle glaucoma (POAG) of both eyes    Followed by ophthalmology Has latanoprost eyedrops Her recent changes in vision could be due to macular degeneration - needs an ophthalmology evaluation      Other Visit Diagnoses     Need for vaccination       Encounter for immunization       Relevant Orders   Flu Vaccine Trivalent High Dose (Fluad) (Completed)       Meds ordered this encounter  Medications   chlorpheniramine-HYDROcodone (TUSSIONEX) 10-8 MG/5ML    Sig: Take 5 mLs by mouth every 12 (twelve) hours as  needed for cough.    Dispense:  115 mL    Refill:  0   HYDROcodone-acetaminophen (NORCO) 5-325 MG tablet    Sig: Take 1 tablet by mouth every 8 (eight) hours as needed for moderate pain.    Dispense:  20 tablet    Refill:  0    Follow-up: Return in about 4 months (around 10/03/2023) for HTN and insomnia.    Anabel Halon, MD

## 2023-06-03 NOTE — Assessment & Plan Note (Signed)
Well-controlled Takes Ambien 10 mg qHS PRN

## 2023-06-04 ENCOUNTER — Encounter: Payer: Self-pay | Admitting: Cardiology

## 2023-06-04 ENCOUNTER — Ambulatory Visit: Payer: Medicare Other | Attending: Cardiology | Admitting: Cardiology

## 2023-06-04 ENCOUNTER — Other Ambulatory Visit (HOSPITAL_COMMUNITY): Payer: Self-pay | Admitting: Internal Medicine

## 2023-06-04 VITALS — BP 122/62 | HR 85 | Ht 67.0 in | Wt 215.0 lb

## 2023-06-04 DIAGNOSIS — Z87898 Personal history of other specified conditions: Secondary | ICD-10-CM | POA: Insufficient documentation

## 2023-06-04 DIAGNOSIS — E782 Mixed hyperlipidemia: Secondary | ICD-10-CM | POA: Insufficient documentation

## 2023-06-04 DIAGNOSIS — I10 Essential (primary) hypertension: Secondary | ICD-10-CM | POA: Insufficient documentation

## 2023-06-04 DIAGNOSIS — Z1231 Encounter for screening mammogram for malignant neoplasm of breast: Secondary | ICD-10-CM

## 2023-06-04 MED ORDER — EZETIMIBE 10 MG PO TABS: 10.0000 mg | ORAL_TABLET | Freq: Every day | ORAL | 3 refills | Status: DC

## 2023-06-04 NOTE — Progress Notes (Signed)
Cardiology Office Note  Date: 06/04/2023   ID: DEMII LEDER, DOB 05/29/1951, MRN [id number]  History of Present Illness: [name] is a 72 y.o. female last seen in August 2023.  She is here for a routine visit.  Reports intermittent chest discomfort as described previously, no change in intensity or pattern.  She usually takes Xanax when this happens, rarely has to use nitroglycerin.  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  We went over her medications.  Blood pressure looks good today on Avapro 75 mg daily.  She does track this at home and states that it is somewhat lower at times with systolics in the 110s.  No frank hypotension however.  She also continues on Tricor with history of statin intolerance and elevated CK in the past.  Her recent LDL had come down to 124 from 139 last year.  We discussed trial of Zetia 10 mg daily.  ECG today shows normal sinus rhythm with Q in lead III.  Echocardiogram in August of last year showed vigorous LVEF at 70 to 75% without regional wall motion abnormalities.  Physical Exam: VS:  BP 122/62 (BP Location: Left Arm, Patient Position: Sitting, Cuff Size: Normal)   Pulse 85   Ht 5\' 7"  (1.702 m)   Wt 215 lb (97.5 kg)   SpO2 93%   BMI 33.67 kg/m , BMI Body mass index is 33.67 kg/m.  Wt Readings from Last 3 Encounters:  06/04/23 215 lb (97.5 kg)  06/03/23 215 lb (97.5 kg)  05/05/23 217 lb (98.4 kg)    General: Patient appears comfortable at rest. HEENT: Conjunctiva and lids normal. Neck: Supple, no elevated JVP or carotid bruits. Lungs: Clear to auscultation, nonlabored breathing at rest. Cardiac: Regular rate and rhythm, no S3 or significant systolic murmur. Extremities: No pitting edema.  ECG:  An ECG dated 10/23/2021 was personally reviewed today and demonstrated:  Sinus rhythm with nonspecific T wave changes.  Labwork: 09/14/2022: Hemoglobin 13.2; Platelets 195 02/01/2023: ALT 25; AST 34; BUN  22; Creatinine, Ser 1.24; Potassium 4.4; Sodium 137 04/28/2023: Magnesium 1.5; TSH 2.855     Component Value Date/Time   CHOL 194 04/28/2023 0830   CHOL 205 (H) 01/20/2022 1433   CHOL 254 07/12/2012 0755   TRIG 184 (H) 04/28/2023 0830   TRIG 325 07/12/2012 0755   HDL 33 (L) 04/28/2023 0830   HDL 26 (L) 01/20/2022 1433   CHOLHDL 5.9 04/28/2023 0830   VLDL 37 04/28/2023 0830   LDLCALC 124 (H) 04/28/2023 0830   LDLCALC 139 (H) 01/20/2022 1433   LDLCALC 156 (H) 12/11/2020 1023   Other Studies Reviewed Today:  Echocardiogram 05/21/2022:  1. Left ventricular ejection fraction, by estimation, is 70 to 75%. The  left ventricle has hyperdynamic function. The left ventricle has no  regional wall motion abnormalities. There is mild left ventricular  hypertrophy. Left ventricular diastolic  parameters are consistent with Grade I diastolic dysfunction (impaired  relaxation). The average left ventricular global longitudinal strain is  -19.7 %. The global longitudinal strain is normal.   2. Right ventricular systolic function is normal. The right ventricular  size is normal. Tricuspid regurgitation signal is inadequate for assessing  PA pressure.   3. The mitral valve is normal in structure. No evidence of mitral valve  regurgitation. No evidence of mitral stenosis.   4. The aortic valve was not well visualized. Aortic valve regurgitation  is not visualized. No aortic stenosis is present.  5. The inferior vena cava is normal in size with greater than 50%  respiratory variability, suggesting right atrial pressure of 3 mmHg.   Assessment and Plan:  1.  Essential hypertension.  Blood pressure looks good today, would continue to track measurements at home.  Continue Avapro 75 mg daily.  2.  Mixed hyperlipidemia.  History of statin intolerance and prior CK elevation.  LDL down to 124 in July.  Add Zetia 10 mg daily.  She will otherwise continue on Tricor.  3.  History of chest pain.  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  Disposition:  Follow up  1 year.  Signed, Jonelle Sidle, M.D., F.A.C.C. [facility]

## 2023-06-04 NOTE — Patient Instructions (Signed)
Medication Instructions:   START Zetia 10 mg daily  Labwork: None today  Testing/Procedures: None today  Follow-Up: 1 year Dr.McDowell  Any Other Special Instructions Will Be Listed Below (If Applicable).  If you need a refill on your cardiac medications before your next appointment, please call your pharmacy.

## 2023-06-11 ENCOUNTER — Ambulatory Visit (INDEPENDENT_AMBULATORY_CARE_PROVIDER_SITE_OTHER): Payer: Medicare Other

## 2023-06-11 VITALS — BP 126/75 | HR 82 | Ht 67.0 in | Wt 218.0 lb

## 2023-06-11 DIAGNOSIS — Z780 Asymptomatic menopausal state: Secondary | ICD-10-CM | POA: Diagnosis not present

## 2023-06-11 DIAGNOSIS — Z0000 Encounter for general adult medical examination without abnormal findings: Secondary | ICD-10-CM

## 2023-06-11 NOTE — Progress Notes (Signed)
Subjective:   [name] is a 72 y.o. female who presents for Medicare Annual (Subsequent) preventive examination.  Visit Complete: In person  Patient Medicare AWV questionnaire was completed by the patient on 06/11/2023; I have confirmed that all information answered by patient is correct and no changes since this date.  Review of Systems     [name] , Thank you for taking time to come for your Medicare Wellness Visit. I appreciate your ongoing commitment to your health goals. Please review the following plan we discussed and let me know if I can assist you in the future.   These are the goals we discussed:  Goals      Patient Stated     None.     Patient Stated     Get tree taken down        This is a list of the screening recommended for you and due dates:  Health Maintenance  Topic Date Due   COVID-19 Vaccine (8 - 2023-24 season) 06/06/2023   Medicare Annual Wellness Visit  06/10/2024   Mammogram  07/06/2024   DTaP/Tdap/Td vaccine (3 - Td or Tdap) 08/03/2024   Colon Cancer Screening  04/22/2031   Pneumonia Vaccine  Completed   Flu Shot  Completed   DEXA scan (bone density measurement)  Completed   Hepatitis C Screening  Completed   Zoster (Shingles) Vaccine  Completed   HPV Vaccine  Aged Out    Cardiac Risk Factors include: hypertension;advanced age (>9men, >66 women)     Objective:    Today's Vitals   06/11/23 1302  BP: 126/75  Pulse: 82  SpO2: 93%  Weight: 218 lb (98.9 kg)  Height: 5\' 7"  (1.702 m)   Body mass index is 34.14 kg/m.     06/11/2023    1:13 PM 05/18/2022    3:31 PM 02/11/2022   12:44 PM 07/23/2021    9:51 AM 05/22/2021    1:19 PM 04/21/2021    8:05 AM 04/17/2021   11:57 AM  Advanced Directives  Does Patient Have a Medical Advance Directive? Yes Yes Yes Yes Yes Yes Yes  Type of Estate agent of [place];Living will   Healthcare Power of [place];Living will Healthcare Power of [place];Living will  Healthcare Power of [employer] of [place];Living will  Does patient want to make changes to medical advance directive? No - Patient declined No - Patient declined No - Patient declined  No - Guardian declined  No - Patient declined  Copy of Healthcare Power of Attorney in Chart? Yes - validated most recent copy scanned in chart (See row information)   Yes - validated most recent copy scanned in chart (See row information) No - copy requested  No - copy requested    Current Medications (verified) Outpatient Encounter Medications as of 06/11/2023  Medication Sig   albuterol (PROVENTIL) (2.5 MG/3ML) 0.083% nebulizer solution Take 3 mLs (2.5 mg total) by nebulization every 4 (four) hours as needed for wheezing or shortness of breath.   albuterol (VENTOLIN HFA) 108 (90 Base) MCG/ACT inhaler Inhale 2 puffs into the lungs every 4 (four) hours as needed for wheezing.   ALPRAZolam (XANAX) 1 MG tablet Take 1 tablet (1 mg total) by mouth 2 (two) times daily as needed for anxiety. (Patient taking differently: Take 0.5-1 mg by mouth at bedtime.)   brimonidine (ALPHAGAN) 0.2 % ophthalmic solution Place 1 drop into both eyes 2 (two) times daily.   budesonide-formoterol (SYMBICORT) 160-4.5 MCG/ACT  inhaler Inhale 2 puffs into the lungs as needed.   chlorpheniramine-HYDROcodone (TUSSIONEX) 10-8 MG/5ML Take 5 mLs by mouth every 12 (twelve) hours as needed for cough.   Cholecalciferol (VITAMIN D3) 50 MCG (2000 UT) TABS Take 2,000 Units by mouth daily.   dexlansoprazole (DEXILANT) 60 MG capsule Take 1 capsule (60 mg total) by mouth daily.   diclofenac Sodium (VOLTAREN) 1 % GEL Apply 4 g topically 4 (four) times daily.   DUREZOL 0.05 % EMUL Place 1 drop into the right eye daily.   estradiol (ESTRACE VAGINAL) 0.1 MG/GM vaginal cream Place 1 Applicatorful vaginally daily as needed.   ezetimibe (ZETIA) 10 MG tablet Take 1 tablet (10 mg total) by mouth daily.   fenofibrate (TRICOR) 145 MG tablet Take 1  tablet (145 mg total) by mouth daily.   HYDROcodone-acetaminophen (NORCO) 5-325 MG tablet Take 1 tablet by mouth every 8 (eight) hours as needed for moderate pain.   hyoscyamine (ANASPAZ) 0.125 MG TBDP disintergrating tablet Place 1 tablet (0.125 mg total) under the tongue daily as needed (IBS). Place 0.125 mg under the tongue daily as needed (IBS).   irbesartan (AVAPRO) 150 MG tablet Take 0.5 tablets (75 mg total) by mouth daily.   latanoprost (XALATAN) 0.005 % ophthalmic solution Place 1 drop into both eyes at bedtime.   levothyroxine (SYNTHROID) 25 MCG tablet Take 1 tablet (25 mcg total) by mouth daily.   Lidocaine-Hydrocortisone Ace 3-2.5 % KIT APPLY TO RECTUM QID for 2 weeks then AS NEEDED FOR RECTAL PAIN OR BLEEDING   Lifitegrast (XIIDRA) 5 % SOLN INSTILL 1 DROP IN BOTH EYES EVERY DAY AS NEEDED (EACH CONTAINER SHOULD YIELD 2 DROPS) STORE IN ORIGINAL FOIL POUCH (Patient taking differently: Place 1 drop into both eyes daily. (EACH CONTAINER SHOULD YIELD 2 DROPS) STORE IN ORIGINAL FOIL POUCH)   Magnesium 500 MG TABS Take 1 tablet by mouth 2 (two) times daily with a meal.   meclizine (ANTIVERT) 12.5 MG tablet Take 1 tablet (12.5 mg total) by mouth 3 (three) times daily as needed for dizziness. (Patient taking differently: Take 12.5 mg by mouth 3 (three) times daily as needed (vertigo).)   Menthol, Topical Analgesic, (BIOFREEZE EX) Apply 1 application topically daily as needed (pain).   metroNIDAZOLE (METROCREAM) 0.75 % cream Apply 1 application. topically as needed.   naproxen (NAPROSYN) 500 MG tablet Take 1 tablet (500 mg total) by mouth 2 (two) times daily with a meal.   neomycin-polymyxin b-dexamethasone (MAXITROL) 3.5-10000-0.1 OINT Place 1 application into both eyes at bedtime as needed. (Patient taking differently: Place 1 application  into both eyes at bedtime as needed (Dry eyes).)   nitroGLYCERIN (NITROLINGUAL) 0.4 MG/SPRAY spray Place 1 spray under the tongue every 5 (five) minutes as  needed for chest pain.   NON FORMULARY Take 1 tablet by mouth daily as needed (Stomach Bloat). FD GUARD   ondansetron (ZOFRAN) 4 MG tablet Take 1 tablet (4 mg total) by mouth every 8 (eight) hours as needed for nausea or vomiting.   prednisoLONE acetate (PRED FORTE) 1 % ophthalmic suspension Place 1 drop into the right eye 3 (three) times daily.   Probiotic Product (PROBIOTIC DAILY PO) Take 1 capsule by mouth daily. VSL #3   Rimegepant Sulfate (NURTEC) 75 MG TBDP Take 1 tablet by mouth as needed.   rizatriptan (MAXALT-MLT) 10 MG disintegrating tablet Take 1 tablet (10 mg total) by mouth as needed for migraine. May repeat in 2 hours if needed. (ok to send out this medication to the  patient, the patient can tolerate this medication and will be monitored, if this is not acceptable please cancel this script and send in a new erx with a note, thanks)   UNABLE TO FIND Place 1 application rectally daily as needed. [facility]  Med Name: lidocaine 5% pramoxine 1% ointment per rectum as needed.   zolpidem (AMBIEN) 10 MG tablet Take 1 tablet (10 mg total) by mouth at bedtime as needed for sleep.   No facility-administered encounter medications on file as of 06/11/2023.    Allergies (verified) Nutmeg oil (myristica oil), Adhesive [tape], Aspirin, Imitrex [sumatriptan], Keflex [cephalexin], Levaquin [levofloxacin in d5w], Other, Statins, Sulfa antibiotics, Sulfonamide derivatives, and Latex   History: Past Medical History:  Diagnosis Date   Anal fissure    Anxiety    Arthritis    Asthma    C. difficile diarrhea 09/17/2021   Cataract    Phreesia 01/05/2021   Chest pain    a. 02/2000 Cath: nl cors, EF 60%;  b. 10/2010 MV: nl LV, no ischemia/infarct;  c. 03/2014 Admit c/p, r/o->grief from husbands death.   Chronic RUQ pain 07/04/2008   EUS slightly dilated CBD (7.44mm), otherwise nl   Depression    Diverticulosis    Eczema    Exposure to chemical inhalation mid 1970s   Fatty liver    G6PD  deficiency    Gallstones    GERD (gastroesophageal reflux disease)    Glaucoma    History of pulmonary embolus (PE)    Treated with Eliquis 07-05-2015   HTN (hypertension)    Hypercholesteremia    a. intolerant to statins.   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 She has limited vision in the right eye.   Migraines    inner ocular   Ocular migraine    Palpitations    Pleurisy    Pneumonia 07/16/2021   Patient was exposed to a harsh chemical in the 1970's that created scar tissue in her lungs. She has had pneumonia several times in the past.   PONV (postoperative nausea and vomiting)    Pulmonary emboli (HCC)    Recurrent upper respiratory infection (URI)    Renal cyst    Retinal cyst    Sickle cell trait (HCC)    Tubular adenoma of colon 09/17/2011   Urticaria    Past Surgical History:  Procedure Laterality Date   ABDOMINAL HYSTERECTOMY     ADENOIDECTOMY     ANGIOPLASTY     APPENDECTOMY     BIOPSY N/A 03/14/2013   Procedure: SMALL BOWEL AND GASTRIC BIOPSIES (Procedure #1);  Surgeon: West Bali, MD;  Location: [facility];  Service: Endoscopy;  Laterality: N/A;   CARDIAC CATHETERIZATION Left 02/11/2000   CATARACT EXTRACTION Left    CATARACT EXTRACTION W/PHACO Right 03/19/2021   Procedure: CATARACT EXTRACTION PHACO AND INTRAOCULAR LENS PLACEMENT (IOC) RIGHT 6.75 00:50.4;  Surgeon: Lockie Mola, MD;  [facility];  Service: Ophthalmology;  Laterality: Right;   CHOLECYSTECTOMY  12/2007   COLONOSCOPY  12/22/2010   ZOX:WRUEAV, cecal adenomatous polyp   COLONOSCOPY WITH PROPOFOL N/A 03/31/2016   Dr. Darrick Penna: four sessile polyps rectum/sigmoid colon, diverticulosi, ext/int hemorrhoids. hyperplastic polyps, next tcs 5 years.   COLONOSCOPY WITH PROPOFOL N/A 04/21/2021   anal fissure, non-bleeding internal hemorrhoids, right colon diverticulosis, one 2 mm polyp in  ascending, three 4-6 mm polyps in descending  colon. Tubular adenomas. 5 year surveillance.   complete hysterectomy     ENTEROSCOPY N/A 03/14/2013   WUJ:WJXB gastritis/ulcers has healed   ESOPHAGOGASTRODUODENOSCOPY  09/22/2011   JYN:WGNF gastritis/Duodenitis   EXCISIONAL HEMORRHOIDECTOMY     FLEXIBLE SIGMOIDOSCOPY N/A 03/14/2013   SLF:3 colon polyp removed/moderate sized internal hemorrhoids   FLEXIBLE SIGMOIDOSCOPY N/A 06/09/2016   Procedure: FLEXIBLE SIGMOIDOSCOPY;  Surgeon: West Bali, MD;  Location: [facility];  Service: Endoscopy;  Laterality: N/A;  rectal polyps times 2   FLEXIBLE SIGMOIDOSCOPY N/A 03/18/2018   Procedure: FLEXIBLE SIGMOIDOSCOPY;  Surgeon: West Bali, MD;  Location: [facility];  Service: Endoscopy;  Laterality: N/A;  9:15am   FOOT SURGERY     bunion removal left   GIVENS CAPSULE STUDY N/A 02/10/2013   Procedure: GIVENS CAPSULE STUDY;  Surgeon: West Bali, MD;  Location: [facility];  Service: Endoscopy;  Laterality: N/A;  730   HEEL SPUR EXCISION     right    HEMORRHOID BANDING N/A 03/14/2013   Procedure: HEMORRHOID BANDING (Procedure #3)  3 bands applied [id number] Exp 02/02/2014 ;  Surgeon: West Bali, MD;  Location: [facility];  Service: Endoscopy;  Laterality: N/A;   HEMORRHOID SURGERY N/A 04/24/2016   Procedure: EXTENSIVE HEMORRHOIDECTOMY;  Surgeon: Ancil Linsey, MD;  Location: [facility];  Service: General;  Laterality: N/A;   LAPAROSCOPY     adhesions   POLYPECTOMY N/A 03/14/2013   ONG:EXBM Gastritis . ULCERS SEEN ON MAY 6 HAVE HEALED   POLYPECTOMY  03/31/2016   Procedure: POLYPECTOMY;  Surgeon: West Bali, MD;  Location: [facility];  Service: Endoscopy;;  sigmoid colon polyps x2, rectal polyps x2   POLYPECTOMY  04/21/2021   Procedure: POLYPECTOMY;  Surgeon: Lanelle Bal, DO;  Location: [facility];  Service: Endoscopy;;   RECTAL EXAM UNDER ANESTHESIA N/A 07/23/2021   Procedure: RECTAL EXAM UNDER ANESTHESIA;  Surgeon:  Romie Levee, MD;  Location: [facility];  Service: General;  Laterality: N/A;   SPHINCTEROTOMY N/A 07/23/2021   Procedure: CHEMICAL SPHINCTEROTOMY;  Surgeon: Romie Levee, MD;  Location: [facility];  Service: General;  Laterality: N/A;   TONSILLECTOMY     TUBAL LIGATION     Family History  Problem Relation Age of Onset   Colon cancer Mother        60s   Urticaria Mother    Heart disease Mother    Colon polyps Mother    Cancer Father        oral   Crohn's disease Sister    Cancer Sister        around heart   Colon polyps Sister    Diabetes Brother    Other Brother        intestinal sugery   Colon polyps Brother    Colon cancer Maternal Grandmother    Colon cancer Maternal Grandfather    Colon cancer Paternal Grandfather    Anesthesia problems Neg Hx    Social History   Socioeconomic History   Marital status: Widowed    Spouse name: Not on file   Number of children: 1   Years of education: Not on file   Highest education level: Not on file  Occupational History   Occupation: homemaker    Employer: UNEMPLOYED  Tobacco Use   Smoking status: Former    Current packs/day: 0.00    Average packs/day: 0.5 packs/day for 30.0 years (15.0 ttl pk-yrs)    Types: Cigarettes  Start date: 10/15/1966    Quit date: 10/15/1996    Years since quitting: 26.6   Smokeless tobacco: Never   Tobacco comments:    Stopped smoking ~ 2000  Vaping Use   Vaping status: Never Used  Substance and Sexual Activity   Alcohol use: Not Currently    Alcohol/week: 4.0 standard drinks of alcohol    Types: 4 Glasses of wine per week    Comment: 1 drinks every couple of weeks   Drug use: No   Sexual activity: Not Currently    Birth control/protection: Surgical  Other Topics Concern   Not on file  Social History Narrative   Does not routinely exercise. Husband passed in JUN 2015 due to prostate ca.   Social Determinants of Health   Financial Resource Strain:  Low Risk  (06/11/2023)   Overall Financial Resource Strain (CARDIA)    Difficulty of Paying Living Expenses: Not hard at all  Food Insecurity: No Food Insecurity (06/11/2023)   Hunger Vital Sign    Worried About Running Out of Food in the Last Year: Never true    Ran Out of Food in the Last Year: Never true  Transportation Needs: No Transportation Needs (06/11/2023)   PRAPARE - Administrator, Civil Service (Medical): No    Lack of Transportation (Non-Medical): No  Physical Activity: Sufficiently Active (06/11/2023)   Exercise Vital Sign    Days of Exercise per Week: 6 days    Minutes of Exercise per Session: 30 min  Stress: No Stress Concern Present (06/11/2023)   [employer] of Occupational Health - Occupational Stress Questionnaire    Feeling of Stress : Not at all  Social Connections: Socially Isolated (06/11/2023)   Social Connection and Isolation Panel [NHANES]    Frequency of Communication with Friends and Family: Three times a week    Frequency of Social Gatherings with Friends and Family: Once a week    Attends Religious Services: Never    Database administrator or Organizations: No    Attends Banker Meetings: Never    Marital Status: Widowed    Tobacco Counseling Counseling given: Not Answered Tobacco comments: Stopped smoking ~ 2000   Clinical Intake:     Pain : No/denies pain     BMI - recorded: 34.14 Nutritional Status: BMI > 30  Obese Nutritional Risks: None Diabetes: No  How often do you need to have someone help you when you read instructions, pamphlets, or other written materials from your doctor or pharmacy?: 1 - Never What is the last grade level you completed in school?: 2 years of college         Activities of Daily Living    06/11/2023    1:03 PM  In your present state of health, do you have any difficulty performing the following activities:  Hearing? 0  Vision? 1  Difficulty concentrating or making decisions? 0   Walking or climbing stairs? 0  Dressing or bathing? 0  Doing errands, shopping? 0  Preparing Food and eating ? N  Using the Toilet? N  In the past six months, have you accidently leaked urine? N  Do you have problems with loss of bowel control? N  Managing your Medications? N  Managing your Finances? N  Housekeeping or managing your Housekeeping? N    Patient Care Team: Anabel Halon, MD as PCP - General (Internal Medicine) Jonelle Sidle, MD as PCP - Cardiology (Cardiology) West Bali, MD (Inactive) (  Gastroenterology)  Indicate any recent Medical Services you may have received from other than [facility] providers in the past year (date may be approximate).     Assessment:   This is a routine wellness examination for [name].  Hearing/Vision screen No results found.   Goals Addressed             This Visit's Progress    Patient Stated       Get tree taken down      Depression Screen    06/11/2023    1:15 PM 06/11/2023    1:11 PM 06/03/2023    1:47 PM 03/29/2023    9:45 AM 02/01/2023    1:33 PM 10/29/2022   11:07 AM 10/01/2022   10:35 AM  PHQ 2/9 Scores  PHQ - 2 Score 0 0 0 0 0 0 0  PHQ- 9 Score   0   0     Fall Risk    06/11/2023    1:13 PM 06/03/2023    1:47 PM 03/29/2023    9:45 AM 02/01/2023    1:33 PM 10/29/2022   11:07 AM  Fall Risk   Falls in the past year? 0 0 0 0 0  Number falls in past yr: 0  0 0 0  Injury with Fall? 0  0 0 0  Risk for fall due to : No Fall Risks    No Fall Risks  Follow up Falls evaluation completed    Falls evaluation completed    MEDICARE RISK AT HOME: Medicare Risk at Home Any stairs in or around the home?: Yes If so, are there any without handrails?: No Home free of loose throw rugs in walkways, pet beds, electrical cords, etc?: Yes Adequate lighting in your home to reduce risk of falls?: Yes Life alert?: Yes Use of a cane, walker or w/c?: Yes Grab bars in the bathroom?: Yes Shower chair or bench in shower?:  Yes Elevated toilet seat or a handicapped toilet?: No  TIMED UP AND GO:  Was the test performed?  Yes  Length of time to ambulate 10 feet: 20 sec Gait slow and steady without use of assistive device    Cognitive Function:    05/18/2022    3:33 PM  MMSE - Mini Mental State Exam  Not completed: Unable to complete        06/11/2023    1:15 PM 05/18/2022    3:34 PM 04/02/2021   10:50 AM  6CIT Screen  What Year? 0 points 0 points 0 points  What month? 0 points 0 points 0 points  What time? 0 points 0 points 0 points  Count back from 20 0 points 0 points 0 points  Months in reverse 0 points 0 points 0 points  Repeat phrase 0 points 0 points 0 points  Total Score 0 points 0 points 0 points    Immunizations Immunization History  Administered Date(s) Administered   Fluad Quad(high Dose 65+) 06/05/2019, 07/22/2020, 06/16/2021, 06/30/2022   Fluad Trivalent(High Dose 65+) 06/03/2023   Influenza Split 07/12/2012   Influenza,inj,Quad PF,6+ Mos 08/03/2014, 07/19/2015, 08/11/2016, 06/24/2017, 07/04/2018   Moderna Covid-19 Vaccine Bivalent Booster 26yrs & up 01/31/2021, 06/30/2021, 01/30/2022   Moderna SARS-COV2 Booster Vaccination 06/30/2022   Moderna Sars-Covid-2 Vaccination 11/16/2019, 12/15/2019, 08/02/2020   Pneumococcal Conjugate-13 02/15/2018   Pneumococcal Polysaccharide-23 08/03/2014, 01/12/2020   Rsv, Bivalent, Protein Subunit Rsvpref,pf Verdis Frederickson) 06/30/2022   Tdap 08/25/2012, 08/03/2014   Zoster Recombinant(Shingrix) 01/16/2020, 03/21/2020   Zoster, Live 08/25/2012  TDAP status: Up to date  Flu Vaccine status: Up to date  Pneumococcal vaccine status: Up to date  Covid-19 vaccine status: Completed vaccines  Qualifies for Shingles Vaccine? Yes   Zostavax completed Yes   Shingrix Completed?: Yes  Screening Tests Health Maintenance  Topic Date Due   COVID-19 Vaccine (8 - 2023-24 season) 06/06/2023   Medicare Annual Wellness (AWV)  06/10/2024   MAMMOGRAM   07/06/2024   DTaP/Tdap/Td (3 - Td or Tdap) 08/03/2024   Colonoscopy  04/22/2031   Pneumonia Vaccine 59+ Years old  Completed   INFLUENZA VACCINE  Completed   DEXA SCAN  Completed   Hepatitis C Screening  Completed   Zoster Vaccines- Shingrix  Completed   HPV VACCINES  Aged Out    Health Maintenance  Health Maintenance Due  Topic Date Due   COVID-19 Vaccine (8 - 2023-24 season) 06/06/2023    Colorectal cancer screening: Type of screening: Colonoscopy. Completed 04/21/2021. Repeat every 10 years  Mammogram status: Completed 07/06/2022. Repeat every year  Bone Density status: Completed 05/18/2018. Results reflect: Bone density results: OSTEOPOROSIS. Repeat every 2 years.  Lung Cancer Screening: (Low Dose CT Chest recommended if Age 53-80 years, 20 pack-year currently smoking OR have quit w/in 15years.) does not qualify.   Lung Cancer Screening Referral:   Additional Screening:  Hepatitis C Screening: does qualify; Completed 05/07/2017   Vision Screening: Recommended annual ophthalmology exams for early detection of glaucoma and other disorders of the eye. Is the patient up to date with their annual eye exam?  Yes  Who is the provider or what is the name of the office in which the patient attends annual eye exams? [facility] If pt is not established with a provider, would they like to be referred to a provider to establish care? No .   Dental Screening: Recommended annual dental exams for proper oral hygiene  Diabetic Foot Exam: N/A  Community Resource Referral / Chronic Care Management: CRR required this visit?  No   CCM required this visit?  No     Plan:     I have personally reviewed and noted the following in the patient's chart:   Medical and social history Use of alcohol, tobacco or illicit drugs  Current medications and supplements including opioid prescriptions. Patient is currently taking opioid prescriptions. Information provided to patient regarding non-opioid  alternatives. Patient advised to discuss non-opioid treatment plan with their provider. Functional ability and status Nutritional status Physical activity Advanced directives List of other physicians Hospitalizations, surgeries, and ER visits in previous 12 months Vitals Screenings to include cognitive, depression, and falls Referrals and appointments  In addition, I have reviewed and discussed with patient certain preventive protocols, quality metrics, and best practice recommendations. A written personalized care plan for preventive services as well as general preventive health recommendations were provided to patient.     Telford Nab, CMA   06/11/2023   After Visit Summary: (Mail) Due to this being a telephonic visit, the after visit summary with patients personalized plan was offered to patient via mail   Nurse Notes:  [name] , Thank you for taking time to come for your Medicare Wellness Visit. I appreciate your ongoing commitment to your health goals. Please review the following plan we discussed and let me know if I can assist you in the future.   These are the goals we discussed:  Goals      Patient Stated     None.     Patient Stated  Get tree taken down        This is a list of the screening recommended for you and due dates:  Health Maintenance  Topic Date Due   COVID-19 Vaccine (8 - 2023-24 season) 06/06/2023   Medicare Annual Wellness Visit  06/10/2024   Mammogram  07/06/2024   DTaP/Tdap/Td vaccine (3 - Td or Tdap) 08/03/2024   Colon Cancer Screening  04/22/2031   Pneumonia Vaccine  Completed   Flu Shot  Completed   DEXA scan (bone density measurement)  Completed   Hepatitis C Screening  Completed   Zoster (Shingles) Vaccine  Completed   HPV Vaccine  Aged Out

## 2023-06-11 NOTE — Patient Instructions (Signed)

## 2023-06-18 ENCOUNTER — Other Ambulatory Visit: Payer: Self-pay | Admitting: Ophthalmology

## 2023-06-18 DIAGNOSIS — H34831 Tributary (branch) retinal vein occlusion, right eye, with macular edema: Secondary | ICD-10-CM | POA: Diagnosis not present

## 2023-06-18 DIAGNOSIS — H5340 Unspecified visual field defects: Secondary | ICD-10-CM

## 2023-06-18 DIAGNOSIS — H547 Unspecified visual loss: Secondary | ICD-10-CM

## 2023-06-18 DIAGNOSIS — Z961 Presence of intraocular lens: Secondary | ICD-10-CM | POA: Diagnosis not present

## 2023-06-18 DIAGNOSIS — H401133 Primary open-angle glaucoma, bilateral, severe stage: Secondary | ICD-10-CM | POA: Diagnosis not present

## 2023-06-21 ENCOUNTER — Ambulatory Visit
Admission: RE | Admit: 2023-06-21 | Discharge: 2023-06-21 | Disposition: A | Discharge status: Home / Self Care | Payer: Medicare Other | Source: Ambulatory Visit | Attending: Ophthalmology | Admitting: Ophthalmology

## 2023-06-21 DIAGNOSIS — Z8673 Personal history of transient ischemic attack (TIA), and cerebral infarction without residual deficits: Secondary | ICD-10-CM | POA: Diagnosis not present

## 2023-06-21 DIAGNOSIS — H547 Unspecified visual loss: Secondary | ICD-10-CM | POA: Diagnosis not present

## 2023-06-21 DIAGNOSIS — H5340 Unspecified visual field defects: Secondary | ICD-10-CM | POA: Insufficient documentation

## 2023-06-21 NOTE — Progress Notes (Signed)
Pt aware she can come back for contrast if needed after read by radiologist.

## 2023-06-23 ENCOUNTER — Ambulatory Visit
Admission: RE | Admit: 2023-06-23 | Discharge: 2023-06-23 | Disposition: A | Discharge status: Home / Self Care | Payer: Medicare Other | Source: Ambulatory Visit | Attending: Ophthalmology | Admitting: Ophthalmology

## 2023-06-23 DIAGNOSIS — H34831 Tributary (branch) retinal vein occlusion, right eye, with macular edema: Secondary | ICD-10-CM | POA: Insufficient documentation

## 2023-06-23 DIAGNOSIS — I659 Occlusion and stenosis of unspecified precerebral artery: Secondary | ICD-10-CM | POA: Diagnosis not present

## 2023-07-07 ENCOUNTER — Ambulatory Visit (HOSPITAL_COMMUNITY)
Admission: RE | Admit: 2023-07-07 | Discharge: 2023-07-07 | Disposition: A | Discharge status: Home / Self Care | Payer: Medicare Other | Source: Ambulatory Visit | Attending: Internal Medicine | Admitting: Internal Medicine

## 2023-07-07 DIAGNOSIS — Z23 Encounter for immunization: Secondary | ICD-10-CM | POA: Diagnosis not present

## 2023-07-07 DIAGNOSIS — Z780 Asymptomatic menopausal state: Secondary | ICD-10-CM | POA: Diagnosis not present

## 2023-07-08 ENCOUNTER — Ambulatory Visit (HOSPITAL_COMMUNITY): Payer: Medicare Other

## 2023-07-08 ENCOUNTER — Ambulatory Visit (HOSPITAL_COMMUNITY)
Admission: RE | Admit: 2023-07-08 | Discharge: 2023-07-08 | Disposition: A | Discharge status: Home / Self Care | Payer: Medicare Other | Source: Ambulatory Visit | Attending: Internal Medicine | Admitting: Internal Medicine

## 2023-07-08 ENCOUNTER — Encounter (HOSPITAL_COMMUNITY): Payer: Self-pay

## 2023-07-08 DIAGNOSIS — Z1231 Encounter for screening mammogram for malignant neoplasm of breast: Secondary | ICD-10-CM | POA: Diagnosis not present

## 2023-07-09 ENCOUNTER — Other Ambulatory Visit (HOSPITAL_COMMUNITY): Payer: Medicare Other

## 2023-07-12 ENCOUNTER — Other Ambulatory Visit (HOSPITAL_COMMUNITY): Payer: Self-pay | Admitting: Internal Medicine

## 2023-07-12 DIAGNOSIS — R928 Other abnormal and inconclusive findings on diagnostic imaging of breast: Secondary | ICD-10-CM

## 2023-07-22 ENCOUNTER — Other Ambulatory Visit: Payer: Self-pay | Admitting: Internal Medicine

## 2023-07-22 ENCOUNTER — Telehealth: Payer: Self-pay | Admitting: Internal Medicine

## 2023-07-22 DIAGNOSIS — F5104 Psychophysiologic insomnia: Secondary | ICD-10-CM

## 2023-07-22 MED ORDER — ZOLPIDEM TARTRATE 10 MG PO TABS: 10.0000 mg | ORAL_TABLET | Freq: Every evening | ORAL | 1 refills | Status: DC | PRN

## 2023-07-22 NOTE — Telephone Encounter (Signed)
Patient advised.

## 2023-07-22 NOTE — Telephone Encounter (Signed)
Patient is calling says she is needing an order of zolpidem (AMBIEN) 10 MG tablet [[id number]] sent to [facility]. Please advise Thank you

## 2023-08-03 ENCOUNTER — Ambulatory Visit (HOSPITAL_COMMUNITY)
Admission: RE | Admit: 2023-08-03 | Discharge: 2023-08-03 | Disposition: A | Discharge status: Home / Self Care | Payer: Medicare Other | Source: Ambulatory Visit | Attending: Internal Medicine | Admitting: Internal Medicine

## 2023-08-03 ENCOUNTER — Ambulatory Visit (HOSPITAL_COMMUNITY): Payer: Medicare Other

## 2023-08-03 DIAGNOSIS — N6001 Solitary cyst of right breast: Secondary | ICD-10-CM | POA: Diagnosis not present

## 2023-08-03 DIAGNOSIS — N6312 Unspecified lump in the right breast, upper inner quadrant: Secondary | ICD-10-CM | POA: Diagnosis not present

## 2023-08-03 DIAGNOSIS — R928 Other abnormal and inconclusive findings on diagnostic imaging of breast: Secondary | ICD-10-CM | POA: Diagnosis not present

## 2023-08-10 ENCOUNTER — Encounter: Payer: Self-pay | Admitting: Internal Medicine

## 2023-08-10 ENCOUNTER — Ambulatory Visit (INDEPENDENT_AMBULATORY_CARE_PROVIDER_SITE_OTHER): Payer: Medicare Other | Admitting: Internal Medicine

## 2023-08-10 VITALS — BP 124/70 | HR 87 | Ht 67.0 in | Wt 214.6 lb

## 2023-08-10 DIAGNOSIS — K219 Gastro-esophageal reflux disease without esophagitis: Secondary | ICD-10-CM

## 2023-08-10 DIAGNOSIS — R197 Diarrhea, unspecified: Secondary | ICD-10-CM

## 2023-08-10 DIAGNOSIS — R7989 Other specified abnormal findings of blood chemistry: Secondary | ICD-10-CM | POA: Diagnosis not present

## 2023-08-10 DIAGNOSIS — K760 Fatty (change of) liver, not elsewhere classified: Secondary | ICD-10-CM | POA: Diagnosis not present

## 2023-08-10 DIAGNOSIS — R1310 Dysphagia, unspecified: Secondary | ICD-10-CM | POA: Diagnosis not present

## 2023-08-10 DIAGNOSIS — Z9049 Acquired absence of other specified parts of digestive tract: Secondary | ICD-10-CM | POA: Diagnosis not present

## 2023-08-10 MED ORDER — DEXLANSOPRAZOLE 60 MG PO CPDR
60.0000 mg | DELAYED_RELEASE_CAPSULE | Freq: Every day | ORAL | 3 refills | Status: DC
Start: 2023-08-10 — End: 2024-03-02

## 2023-08-10 NOTE — Patient Instructions (Addendum)
We have sent the following medications to your pharmacy for you to pick up at your convenience: Dexilant   A referral was placed for [facility] they will contact you to schedule an appointment.  [facility] at [facility] Address: [address] [place], [place], [place] [address] Phone: [phone]  If your blood pressure at your visit was 140/90 or greater, please contact your primary care physician to follow up on this.  _______________________________________________________  If you are age 53 or older, your body mass index should be between 23-30. Your Body mass index is 33.61 kg/m. If this is out of the aforementioned range listed, please consider follow up with your Primary Care Provider.  If you are age 32 or younger, your body mass index should be between 19-25. Your Body mass index is 33.61 kg/m. If this is out of the aformentioned range listed, please consider follow up with your Primary Care Provider.   ________________________________________________________  The [facility] providers would like to encourage you to use [facility] to communicate with providers for non-urgent requests or questions.  Due to long hold times on the telephone, sending your provider a message by [facility] may be a faster and more efficient way to get a response.  Please allow 48 business hours for a response.  Please remember that this is for non-urgent requests.  _______________________________________________________  Thank you for entrusting me with your care and for choosing [facility], Dr. Eulah Pont

## 2023-08-10 NOTE — Progress Notes (Signed)
Chief Complaint: Fatty liver, GERD  HPI : 72 year old female with history of colon polyps, asthma, PE, hypothyroidism, IBS, hemorrhoids s/p hemorrhoidectomy in 2017, prior anal fissure s/p sphincterotomy in 07/2021, fatty liver, and GERD presents for follow of up of fatty liver and GERD  Interval History: Her rectum is feeling a lot better overall. Sometimes her body doesn't want to have a BM. She will have some lower abdominal cramping if she is more constipated. When she has taken Miralax in the past, she would have too much diarrhea. Her stools are no longer loose like in the past. She doesn't have a gallbladder so different foods cause different bowel habits. If she eats something heavy, then she can feel the food moving through her body. If she has more frequent stools, she will use Imodium to help her be able go out and do her daily activities. When she eats more fiber, she will get more constipated. She is on Dexilant PRN for GERD, which seems to work. She has been limiting her Dexilant use due to some concern about side effects. She does think that foods get stuck in the bottom of her chest at times.  Wt Readings from Last 3 Encounters:  08/10/23 214 lb 9.6 oz (97.3 kg)  06/11/23 218 lb (98.9 kg)  06/04/23 215 lb (97.5 kg)   Current Outpatient Medications  Medication Sig Dispense Refill   albuterol (PROVENTIL) (2.5 MG/3ML) 0.083% nebulizer solution Take 3 mLs (2.5 mg total) by nebulization every 4 (four) hours as needed for wheezing or shortness of breath. 150 mL 3   albuterol (VENTOLIN HFA) 108 (90 Base) MCG/ACT inhaler Inhale 2 puffs into the lungs every 4 (four) hours as needed for wheezing. 54 g 3   ALPRAZolam (XANAX) 1 MG tablet Take 1 tablet (1 mg total) by mouth 2 (two) times daily as needed for anxiety. (Patient taking differently: Take 0.5-1 mg by mouth at bedtime.) 180 tablet 3   brimonidine (ALPHAGAN) 0.2 % ophthalmic solution Place 1 drop into both eyes 2 (two) times daily. 15  mL 3   budesonide-formoterol (SYMBICORT) 160-4.5 MCG/ACT inhaler Inhale 2 puffs into the lungs as needed. 10.2 g 3   chlorpheniramine-HYDROcodone (TUSSIONEX) 10-8 MG/5ML Take 5 mLs by mouth every 12 (twelve) hours as needed for cough. 115 mL 0   Cholecalciferol (VITAMIN D3) 50 MCG (2000 UT) TABS Take 2,000 Units by mouth daily.     dexlansoprazole (DEXILANT) 60 MG capsule Take 1 capsule (60 mg total) by mouth daily. 90 capsule 3   diclofenac Sodium (VOLTAREN) 1 % GEL Apply 4 g topically 4 (four) times daily. 100 g 2   DUREZOL 0.05 % EMUL Place 1 drop into the right eye daily.     estradiol (ESTRACE VAGINAL) 0.1 MG/GM vaginal cream Place 1 Applicatorful vaginally daily as needed. 42.5 g 3   ezetimibe (ZETIA) 10 MG tablet Take 1 tablet (10 mg total) by mouth daily. 90 tablet 3   fenofibrate (TRICOR) 145 MG tablet Take 1 tablet (145 mg total) by mouth daily. 90 tablet 3   HYDROcodone-acetaminophen (NORCO) 5-325 MG tablet Take 1 tablet by mouth every 8 (eight) hours as needed for moderate pain. 20 tablet 0   hyoscyamine (ANASPAZ) 0.125 MG TBDP disintergrating tablet Place 1 tablet (0.125 mg total) under the tongue daily as needed (IBS). Place 0.125 mg under the tongue daily as needed (IBS). 30 tablet 0   irbesartan (AVAPRO) 150 MG tablet Take 0.5 tablets (75 mg total) by mouth  daily. 30 tablet 0   latanoprost (XALATAN) 0.005 % ophthalmic solution Place 1 drop into both eyes at bedtime. 9 mL 3   levothyroxine (SYNTHROID) 25 MCG tablet Take 1 tablet (25 mcg total) by mouth daily. 90 tablet 1   Lidocaine-Hydrocortisone Ace 3-2.5 % KIT APPLY TO RECTUM QID for 2 weeks then AS NEEDED FOR RECTAL PAIN OR BLEEDING 1 kit 11   Lifitegrast (XIIDRA) 5 % SOLN INSTILL 1 DROP IN BOTH EYES EVERY DAY AS NEEDED (EACH CONTAINER SHOULD YIELD 2 DROPS) STORE IN ORIGINAL FOIL POUCH (Patient taking differently: Place 1 drop into both eyes daily. (EACH CONTAINER SHOULD YIELD 2 DROPS) STORE IN ORIGINAL FOIL POUCH) 60 each 3    Magnesium 500 MG TABS Take 1 tablet by mouth 2 (two) times daily with a meal.     meclizine (ANTIVERT) 12.5 MG tablet Take 1 tablet (12.5 mg total) by mouth 3 (three) times daily as needed for dizziness. (Patient taking differently: Take 12.5 mg by mouth 3 (three) times daily as needed (vertigo).) 30 tablet 3   Menthol, Topical Analgesic, (BIOFREEZE EX) Apply 1 application topically daily as needed (pain).     metroNIDAZOLE (METROCREAM) 0.75 % cream Apply 1 application. topically as needed. 45 g 3   naproxen (NAPROSYN) 500 MG tablet Take 1 tablet (500 mg total) by mouth 2 (two) times daily with a meal. 60 tablet 5   neomycin-polymyxin b-dexamethasone (MAXITROL) 3.5-10000-0.1 OINT Place 1 application into both eyes at bedtime as needed. (Patient taking differently: Place 1 application  into both eyes at bedtime as needed (Dry eyes).) 10.5 g 3   nitroGLYCERIN (NITROLINGUAL) 0.4 MG/SPRAY spray Place 1 spray under the tongue every 5 (five) minutes as needed for chest pain. 12 g 3   NON FORMULARY Take 1 tablet by mouth daily as needed (Stomach Bloat). FD GUARD     ondansetron (ZOFRAN) 4 MG tablet Take 1 tablet (4 mg total) by mouth every 8 (eight) hours as needed for nausea or vomiting. 30 tablet 1   prednisoLONE acetate (PRED FORTE) 1 % ophthalmic suspension Place 1 drop into the right eye 3 (three) times daily.     Probiotic Product (PROBIOTIC DAILY PO) Take 1 capsule by mouth daily. VSL #3     Rimegepant Sulfate (NURTEC) 75 MG TBDP Take 1 tablet by mouth as needed.     rizatriptan (MAXALT-MLT) 10 MG disintegrating tablet Take 1 tablet (10 mg total) by mouth as needed for migraine. May repeat in 2 hours if needed. (ok to send out this medication to the patient, the patient can tolerate this medication and will be monitored, if this is not acceptable please cancel this script and send in a new erx with a note, thanks) 9 tablet 6   UNABLE TO FIND Place 1 application rectally daily as needed. [facility]  Med Name: lidocaine 5% pramoxine 1% ointment per rectum as needed.     zolpidem (AMBIEN) 10 MG tablet Take 1 tablet (10 mg total) by mouth at bedtime as needed for sleep. 90 tablet 1   No current facility-administered medications for this visit.   Physical Exam: BP 124/70   Pulse 87   Ht 5\' 7"  (1.702 m)   Wt 214 lb 9.6 oz (97.3 kg)   SpO2 98%   BMI 33.61 kg/m  Constitutional: Pleasant,well-developed, female in no acute distress. HEENT: Normocephalic and atraumatic. Conjunctivae are normal. No scleral icterus. Cardiovascular: Normal rate, regular rhythm.  Pulmonary/chest: Effort normal and breath sounds normal. No  wheezing, rales or rhonchi. Abdominal: Soft, nondistended, nontender. Bowel sounds active throughout. There are no masses palpable. No hepatomegaly. Extremities: No edema Neurological: Alert and oriented to person place and time. Skin: Skin is warm and dry. No rashes noted. Psychiatric: Normal mood and affect. Behavior is normal.  Labs 07/2012: TTG IGA negative.  Labs 09/2021: C dif toxin positive on GI profile. TSH nml. CMP with mildly elevated Cr of 1.17.   Labs 02/2022: GI profile positive for norovirus. C dif negative. O&P negative.  Labs 04/2022: CMP with elevated Cr of 1.1, elevated AST of 65, elevated T bili of 1.4. TSH and FT4 nml.   Labs 09/2022: CBC nml. CRP nml. ESR nml.   Labs 10/2022: CMP with mildly low sodium of 134, elevated Cr of 1.24. and elevated AST of 44. TSH nml. Free T4 mildly elevated at 1.19.   Labs 01/2023: CMP with mildly elevated Cr of 1.24. HbA1C 5.6%.  Labs 04/2023: TSH nml. Free T4 nml. Magnesium low at 1.5.   CT A/P w/contrast 12/30/20: IMPRESSION: 2 cm benign cyst in lower pole of right kidney, corresponding to the lesion seen on recent MRI. No evidence of renal neoplasm, urolithiasis, or hydronephrosis.  Moderate hepatic steatosis.  Aortic Atherosclerosis (ICD10-I70.0).  Ab U/S with elastography  09/09/22: IMPRESSION: ULTRASOUND RUQ: Echogenic liver.  Postcholecystectomy. ULTRASOUND HEPATIC ELASTOGRAPHY: Median kPa:  7.4 Diagnostic category: < or = 9 kPa: in the absence of other known clinical signs, rules out cACLD  SBE 03/14/13: Mild gastritis.  Flex sig 03/18/18: - Anal fissure found on perianal exam. - Hemorrhoids found on perianal exam. - The examination was otherwise normal.  Colonoscopy 04/21/21: - Anal fissure found on perianal exam. - Non-bleeding internal hemorrhoids. - Diverticulosis in the right colon. - One 2 mm polyp in the ascending colon, removed with a cold biopsy forceps. Resected and retrieved. - Three 4 to 6 mm polyps in the descending colon, removed with a cold snare. Resected and retrieved. Path: A. COLON, ASCENDING, POLYPECTOMY:  - Tubular adenoma.  - No high-grade dysplasia or carcinoma.  B. COLON, DESCENDING, POLYPECTOMY:  - Tubular adenoma (1).  - No high-grade dysplasia or carcinoma.  - Hyperplastic polyp (1).  EGD 02/25/22: - Normal esophagus. Biopsied. - Gastritis. Biopsied. - Two duodenal polyps. Resected and retrieved. Path: 1. Surgical [P], small bowel polyps - DUODENAL MUCOSA WITH PEPTIC INJURY AND BRUNNER GLAND HYPERPLASIA. - NO DYSPLASIA OR MALIGNANCY. 2. Surgical [P], gastric - ANTRAL AND OXYNTIC MUCOSA WITH HYPEREMIA. - NO HELICOBACTER PYLORI IDENTIFIED. 3. Surgical [P], esophageal - UNREMARKABLE SQUAMOUS MUCOSA. - NO EOSINOPHILIC ESOPHAGITIS.  ASSESSMENT AND PLAN: GERD Dysphagia Hepatic steatosis Elevated LFTs History of cholecystectomy History of diarrhea and fecal incontinence, improved with pelvic floor PT and low FODMAP diet. Now occasional constipation History of C dif  Patient overall seems to be doing okay. She has had more issues with constipation but taking Miralax seems to swing her too far into the diarrhea end of the spectrum. Fiber seems to cause more constipation issues. Thus I encouraged her to continue to  listen to her body and to avoid certain food triggers for her symptoms. Patient was previously referred to [facility] weight loss clinic but for some reason this referral was closed. Thus will place another referral. I did express my hesitancy about her starting a GLP-1 agonist since I do think that the patient is sensitive to GI issues and may not tolerate the GI side effects of the GLP-1 agonists well. Patient is on Dexilant PRN  for GERD, which seems to work when taken. Patient has described some issues with dysphagia recently, which may be related to uncontrolled GERD. Thus I asked her to take the Dexilant more consistently to see if this helps with her dysphagia. Patient's last EGD in 02/2022 did not show any obvious signs of stricture but if she does not respond to the Dexilant alone, could consider further work up with repeat EGD or esophageal manometry in the future - Continue low FODMAP diet - Continue Imodium PRN  - Continue Dexilant. Will refill. Patient will plan to take more consistently - Previously completed pelvic floor PT - Previously referred to [facility] weight [facility] clinic. Patient could reach out to the weight loss directly to get scheduled if she is still interested - Repeat colonoscopy for polyp surveillance due in 04/2026 - RTC 6 months  Eulah Pont, MD  I spent 43 minutes of time, including in depth chart review, independent review of results as outlined above, communicating results with the patient directly, face-to-face time with the patient, coordinating care, ordering studies and medications as appropriate, and documentation.

## 2023-08-25 ENCOUNTER — Ambulatory Visit: Payer: Self-pay | Admitting: Internal Medicine

## 2023-08-25 ENCOUNTER — Telehealth: Payer: Self-pay

## 2023-08-25 NOTE — Telephone Encounter (Signed)
Chief Complaint: cough  Symptoms: cough, intermittent SOB Frequency: intermittent, worse in morning Pertinent Negatives: Patient denies fever, CP Disposition: [] ED /[] Urgent Care (no appt availability in office) / [] Appointment(In office/virtual)/ []  [facility]/ [] Home Care/ [x] Refused Recommended Disposition /[] [facility]/ []  Follow-up with PCP Additional Notes: Pt was in home with mold, has had cough since staying in home. Yellow sticky phlegm.  Using nebs x4/day. Using Tylenol cough medicine. Cannot come to clinic today d/t lack of transportation. Advised ED via 911, pt declines.  Pt advised to call back or go to ED if develops fever. Pt agreeable. Will be seen in 2 days in clinic.    Copied from CRM [id number]. Topic: Clinical - Red Word Triage >> Aug 25, 2023 10:28 AM Raven B wrote: Red Word that prompted transfer to Nurse Triage: PT has had cough for 6 weeks & now having trouble breathing. Reason for Disposition  [1] MILD difficulty breathing (e.g., minimal/no SOB at rest, SOB with walking, pulse <100) AND [2] still present when not coughing  Answer Assessment - Initial Assessment Questions 1. ONSET: "When did the cough begin?"      Oct 16 2. SEVERITY: "How bad is the cough today?"      moderate 3. SPUTUM: "Describe the color of your sputum" (none, dry cough; clear, white, yellow, green)     Yellow, sticky 4. HEMOPTYSIS: "Are you coughing up any blood?" If so ask: "How much?" (flecks, streaks, tablespoons, etc.)     Denies 5. DIFFICULTY BREATHING: "Are you having difficulty breathing?" If Yes, ask: "How bad is it?" (e.g., mild, moderate, severe)    - MILD: No SOB at rest, mild SOB with walking, speaks normally in sentences, can lie down, no retractions, pulse < 100.    - MODERATE: SOB at rest, SOB with minimal exertion and prefers to sit, cannot lie down flat, speaks in phrases, mild retractions, audible wheezing, pulse 100-120.    - SEVERE: Very SOB at rest,  speaks in single words, struggling to breathe, sitting hunched forward, retractions, pulse > 120      moderate 6. FEVER: "Do you have a fever?" If Yes, ask: "What is your temperature, how was it measured, and when did it start?"     denies 7. CARDIAC HISTORY: "Do you have any history of heart disease?" (e.g., heart attack, congestive heart failure)      denies 8. LUNG HISTORY: "Do you have any history of lung disease?"  (e.g., pulmonary embolus, asthma, emphysema)     PE L lung, pneumonia, asthma,  9. PE RISK FACTORS: "Do you have a history of blood clots?" (or: recent major surgery, recent prolonged travel, bedridden)     Hx of blood clots d/t coughing, per pt. 2017 10. OTHER SYMPTOMS: "Do you have any other symptoms?" (e.g., runny nose, wheezing, chest pain)       Runny nose 12. TRAVEL: "Have you traveled out of the country in the last month?" (e.g., travel history, exposures)       denies  Protocols used: Cough - Acute Productive-A-AH

## 2023-08-25 NOTE — Telephone Encounter (Signed)
Copied from CRM [id number]. Topic: General - Transportation >> Aug 25, 2023  3:52 PM Hector Shade B wrote: Reason for CRM patient has been scheduled for appt on Friday: transportation [facility]) stated the transportation was requested but the cost center information was not included: transportation # is [phone]

## 2023-08-26 NOTE — Telephone Encounter (Signed)
[facility] contacted with CC #

## 2023-08-27 ENCOUNTER — Ambulatory Visit (INDEPENDENT_AMBULATORY_CARE_PROVIDER_SITE_OTHER): Payer: Medicare Other | Admitting: Internal Medicine

## 2023-08-27 ENCOUNTER — Encounter: Payer: Self-pay | Admitting: Internal Medicine

## 2023-08-27 VITALS — BP 129/75 | HR 96 | Ht 67.0 in | Wt 215.6 lb

## 2023-08-27 DIAGNOSIS — I10 Essential (primary) hypertension: Secondary | ICD-10-CM | POA: Diagnosis not present

## 2023-08-27 DIAGNOSIS — J209 Acute bronchitis, unspecified: Secondary | ICD-10-CM

## 2023-08-27 DIAGNOSIS — J4531 Mild persistent asthma with (acute) exacerbation: Secondary | ICD-10-CM

## 2023-08-27 MED ORDER — TRELEGY ELLIPTA 100-62.5-25 MCG/ACT IN AEPB
1.0000 | INHALATION_SPRAY | Freq: Every day | RESPIRATORY_TRACT | 11 refills | Status: DC
Start: 2023-08-27 — End: 2023-10-04

## 2023-08-27 MED ORDER — METHYLPREDNISOLONE 4 MG PO TBPK
ORAL_TABLET | ORAL | 0 refills | Status: DC
Start: 2023-08-27 — End: 2023-10-04

## 2023-08-27 MED ORDER — IRBESARTAN 150 MG PO TABS
75.0000 mg | ORAL_TABLET | Freq: Every day | ORAL | 0 refills | Status: DC
Start: 2023-08-27 — End: 2023-10-04

## 2023-08-27 NOTE — Progress Notes (Signed)
Acute Office Visit  Subjective:    Patient ID: [name], female    DOB: 01-Mar-1951, 72 y.o.   MRN: [id number]  Chief Complaint  Patient presents with   Cough    Cough with trouble breathing since October 16th    HPI Patient is in today for, complaint of recent worsening of cough and dyspnea for the last 1 month.  She went to beach with her friends about her month ago, and had to stay at a place where there was mold.  She started having cough after returning, has tried Tussionex for it.  She has been using Symbicort regularly and albuterol nebulizer about 4 times in a day for the last 1 week due to worsening of dyspnea and wheezing.  Denies any fever or chills.  Past Medical History:  Diagnosis Date   Anal fissure    Anxiety    Arthritis    Asthma    C. difficile diarrhea 09/17/2021   Cataract    Phreesia 01/05/2021   Chest pain    a. 02/2000 Cath: nl cors, EF 60%;  b. 10/2010 MV: nl LV, no ischemia/infarct;  c. 03/2014 Admit c/p, r/o->grief from husbands death.   Chronic RUQ pain Sep 25, 2008   EUS slightly dilated CBD (7.35mm), otherwise nl   Depression    Diverticulosis    Eczema    Exposure to chemical inhalation mid 1970s   Fatty liver    G6PD deficiency    Gallstones    GERD (gastroesophageal reflux disease)    Glaucoma    History of pulmonary embolus (PE)    Treated with Eliquis 2015/09/26   HTN (hypertension)    Hypercholesteremia    a. intolerant to statins.   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 She has limited vision in the right eye.   Migraines    inner ocular   Ocular migraine    Palpitations    Pleurisy    Pneumonia 07/16/2021   Patient was exposed to a harsh chemical in the 1970's that created scar tissue in her lungs. She has had pneumonia several times in the past.   PONV (postoperative nausea and vomiting)    Pulmonary emboli (HCC)    Recurrent upper  respiratory infection (URI)    Renal cyst    Retinal cyst    Sickle cell trait (HCC)    Tubular adenoma of colon 09/17/2011   Urticaria     Past Surgical History:  Procedure Laterality Date   ABDOMINAL HYSTERECTOMY     ADENOIDECTOMY     ANGIOPLASTY     APPENDECTOMY     BIOPSY N/A 03/14/2013   Procedure: SMALL BOWEL AND GASTRIC BIOPSIES (Procedure #1);  Surgeon: West Bali, MD;  Location: [facility];  Service: Endoscopy;  Laterality: N/A;   CARDIAC CATHETERIZATION Left 02/11/2000   CATARACT EXTRACTION Left    CATARACT EXTRACTION W/PHACO Right 03/19/2021   Procedure: CATARACT EXTRACTION PHACO AND INTRAOCULAR LENS PLACEMENT (IOC) RIGHT 6.75 00:50.4;  Surgeon: Lockie Mola, MD;  Location: [facility];  Service: Ophthalmology;  Laterality: Right;   CHOLECYSTECTOMY  12/2007   COLONOSCOPY  12/22/2010   ZDG:UYQIHK, cecal adenomatous polyp   COLONOSCOPY WITH PROPOFOL N/A 03/31/2016   Dr. Darrick Penna: four sessile polyps rectum/sigmoid colon, diverticulosi, ext/int hemorrhoids. hyperplastic polyps, next tcs 5 years.   COLONOSCOPY WITH PROPOFOL N/A 04/21/2021   anal  fissure, non-bleeding internal hemorrhoids, right colon diverticulosis, one 2 mm polyp in ascending, three 4-6 mm polyps in descending colon. Tubular adenomas. 5 year surveillance.   complete hysterectomy     ENTEROSCOPY N/A 03/14/2013   ION:GEXB gastritis/ulcers has healed   ESOPHAGOGASTRODUODENOSCOPY  09/22/2011   MWU:XLKG gastritis/Duodenitis   EXCISIONAL HEMORRHOIDECTOMY     FLEXIBLE SIGMOIDOSCOPY N/A 03/14/2013   SLF:3 colon polyp removed/moderate sized internal hemorrhoids   FLEXIBLE SIGMOIDOSCOPY N/A 06/09/2016   Procedure: FLEXIBLE SIGMOIDOSCOPY;  Surgeon: West Bali, MD;  Location: [facility];  Service: Endoscopy;  Laterality: N/A;  rectal polyps times 2   FLEXIBLE SIGMOIDOSCOPY N/A 03/18/2018   Procedure: FLEXIBLE SIGMOIDOSCOPY;  Surgeon: West Bali, MD;  Location: [facility];  Service:  Endoscopy;  Laterality: N/A;  9:15am   FOOT SURGERY     bunion removal left   GIVENS CAPSULE STUDY N/A 02/10/2013   Procedure: GIVENS CAPSULE STUDY;  Surgeon: West Bali, MD;  Location: [facility];  Service: Endoscopy;  Laterality: N/A;  730   HEEL SPUR EXCISION     right    HEMORRHOID BANDING N/A 03/14/2013   Procedure: HEMORRHOID BANDING (Procedure #3)  3 bands applied [id number]#[id number] Exp 02/02/2014 ;  Surgeon: West Bali, MD;  Location: [facility];  Service: Endoscopy;  Laterality: N/A;   HEMORRHOID SURGERY N/A 04/24/2016   Procedure: EXTENSIVE HEMORRHOIDECTOMY;  Surgeon: Ancil Linsey, MD;  Location: [facility];  Service: General;  Laterality: N/A;   LAPAROSCOPY     adhesions   POLYPECTOMY N/A 03/14/2013   YQI:HKVQ Gastritis . ULCERS SEEN ON MAY 6 HAVE HEALED   POLYPECTOMY  03/31/2016   Procedure: POLYPECTOMY;  Surgeon: West Bali, MD;  Location: [facility];  Service: Endoscopy;;  sigmoid colon polyps x2, rectal polyps x2   POLYPECTOMY  04/21/2021   Procedure: POLYPECTOMY;  Surgeon: Lanelle Bal, DO;  Location: [facility];  Service: Endoscopy;;   RECTAL EXAM UNDER ANESTHESIA N/A 07/23/2021   Procedure: RECTAL EXAM UNDER ANESTHESIA;  Surgeon: Romie Levee, MD;  Location: [facility];  Service: General;  Laterality: N/A;   SPHINCTEROTOMY N/A 07/23/2021   Procedure: CHEMICAL SPHINCTEROTOMY;  Surgeon: Romie Levee, MD;  Location[facility];  Service: General;  Laterality: N/A;   TONSILLECTOMY     TUBAL LIGATION      Family History  Problem Relation Age of Onset   Colon cancer Mother        77s   Urticaria Mother    Heart disease Mother    Colon polyps Mother    Cancer Father        oral   Crohn's disease Sister    Cancer Sister        around heart   Colon polyps Sister    Diabetes Brother    Other Brother        intestinal sugery   Colon polyps Brother    Colon cancer Maternal Grandmother    Colon cancer Maternal  Grandfather    Colon cancer Paternal Grandfather    Anesthesia problems Neg Hx     Social History   Socioeconomic History   Marital status: Widowed    Spouse name: Not on file   Number of children: 1   Years of education: Not on file   Highest education level: Not on file  Occupational History   Occupation: homemaker    Employer: UNEMPLOYED  Tobacco Use   Smoking status: Former  Current packs/day: 0.00    Average packs/day: 0.5 packs/day for 30.0 years (15.0 ttl pk-yrs)    Types: Cigarettes    Start date: 10/15/1966    Quit date: 10/15/1996    Years since quitting: 26.8   Smokeless tobacco: Never   Tobacco comments:    Stopped smoking ~ 2000  Vaping Use   Vaping status: Never Used  Substance and Sexual Activity   Alcohol use: Not Currently    Alcohol/week: 4.0 standard drinks of alcohol    Types: 4 Glasses of wine per week    Comment: 1 drinks every couple of weeks   Drug use: No   Sexual activity: Not Currently    Birth control/protection: Surgical  Other Topics Concern   Not on file  Social History Narrative   Does not routinely exercise. Husband passed in JUN 2015 due to prostate ca.   Social Determinants of Health   Financial Resource Strain: Low Risk  (06/11/2023)   Overall Financial Resource Strain (CARDIA)    Difficulty of Paying Living Expenses: Not hard at all  Food Insecurity: No Food Insecurity (06/11/2023)   Hunger Vital Sign    Worried About Running Out of Food in the Last Year: Never true    Ran Out of Food in the Last Year: Never true  Transportation Needs: No Transportation Needs (06/11/2023)   PRAPARE - Administrator, Civil Service (Medical): No    Lack of Transportation (Non-Medical): No  Physical Activity: Sufficiently Active (06/11/2023)   Exercise Vital Sign    Days of Exercise per Week: 6 days    Minutes of Exercise per Session: 30 min  Stress: No Stress Concern Present (06/11/2023)   [employer] of Occupational Health -  Occupational Stress Questionnaire    Feeling of Stress : Not at all  Social Connections: Socially Isolated (06/11/2023)   Social Connection and Isolation Panel [NHANES]    Frequency of Communication with Friends and Family: Three times a week    Frequency of Social Gatherings with Friends and Family: Once a week    Attends Religious Services: Never    Database administrator or Organizations: No    Attends Banker Meetings: Never    Marital Status: Widowed  Intimate Partner Violence: Not At Risk (06/11/2023)   Humiliation, Afraid, Rape, and Kick questionnaire    Fear of Current or Ex-Partner: No    Emotionally Abused: No    Physically Abused: No    Sexually Abused: No    Outpatient Medications Prior to Visit  Medication Sig Dispense Refill   albuterol (PROVENTIL) (2.5 MG/3ML) 0.083% nebulizer solution Take 3 mLs (2.5 mg total) by nebulization every 4 (four) hours as needed for wheezing or shortness of breath. 150 mL 3   albuterol (VENTOLIN HFA) 108 (90 Base) MCG/ACT inhaler Inhale 2 puffs into the lungs every 4 (four) hours as needed for wheezing. 54 g 3   ALPRAZolam (XANAX) 1 MG tablet Take 1 tablet (1 mg total) by mouth 2 (two) times daily as needed for anxiety. (Patient taking differently: Take 0.5-1 mg by mouth at bedtime.) 180 tablet 3   brimonidine (ALPHAGAN) 0.2 % ophthalmic solution Place 1 drop into both eyes 2 (two) times daily. 15 mL 3   budesonide-formoterol (SYMBICORT) 160-4.5 MCG/ACT inhaler Inhale 2 puffs into the lungs as needed. 10.2 g 3   chlorpheniramine-HYDROcodone (TUSSIONEX) 10-8 MG/5ML Take 5 mLs by mouth every 12 (twelve) hours as needed for cough. 115 mL 0  Cholecalciferol (VITAMIN D3) 50 MCG (2000 UT) TABS Take 2,000 Units by mouth daily.     dexlansoprazole (DEXILANT) 60 MG capsule Take 1 capsule (60 mg total) by mouth daily. 90 capsule 3   diclofenac Sodium (VOLTAREN) 1 % GEL Apply 4 g topically 4 (four) times daily. 100 g 2   DUREZOL 0.05 % EMUL  Place 1 drop into the right eye daily.     estradiol (ESTRACE VAGINAL) 0.1 MG/GM vaginal cream Place 1 Applicatorful vaginally daily as needed. 42.5 g 3   ezetimibe (ZETIA) 10 MG tablet Take 1 tablet (10 mg total) by mouth daily. 90 tablet 3   fenofibrate (TRICOR) 145 MG tablet Take 1 tablet (145 mg total) by mouth daily. 90 tablet 3   HYDROcodone-acetaminophen (NORCO) 5-325 MG tablet Take 1 tablet by mouth every 8 (eight) hours as needed for moderate pain. 20 tablet 0   hyoscyamine (ANASPAZ) 0.125 MG TBDP disintergrating tablet Place 1 tablet (0.125 mg total) under the tongue daily as needed (IBS). Place 0.125 mg under the tongue daily as needed (IBS). 30 tablet 0   latanoprost (XALATAN) 0.005 % ophthalmic solution Place 1 drop into both eyes at bedtime. 9 mL 3   levothyroxine (SYNTHROID) 25 MCG tablet Take 1 tablet (25 mcg total) by mouth daily. 90 tablet 1   Lidocaine-Hydrocortisone Ace 3-2.5 % KIT APPLY TO RECTUM QID for 2 weeks then AS NEEDED FOR RECTAL PAIN OR BLEEDING 1 kit 11   Lifitegrast (XIIDRA) 5 % SOLN INSTILL 1 DROP IN BOTH EYES EVERY DAY AS NEEDED (EACH CONTAINER SHOULD YIELD 2 DROPS) STORE IN ORIGINAL FOIL POUCH (Patient taking differently: Place 1 drop into both eyes daily. (EACH CONTAINER SHOULD YIELD 2 DROPS) STORE IN ORIGINAL FOIL POUCH) 60 each 3   Magnesium 500 MG TABS Take 1 tablet by mouth 2 (two) times daily with a meal.     meclizine (ANTIVERT) 12.5 MG tablet Take 1 tablet (12.5 mg total) by mouth 3 (three) times daily as needed for dizziness. (Patient taking differently: Take 12.5 mg by mouth 3 (three) times daily as needed (vertigo).) 30 tablet 3   Menthol, Topical Analgesic, (BIOFREEZE EX) Apply 1 application topically daily as needed (pain).     metroNIDAZOLE (METROCREAM) 0.75 % cream Apply 1 application. topically as needed. 45 g 3   naproxen (NAPROSYN) 500 MG tablet Take 1 tablet (500 mg total) by mouth 2 (two) times daily with a meal. 60 tablet 5   neomycin-polymyxin  b-dexamethasone (MAXITROL) 3.5-10000-0.1 OINT Place 1 application into both eyes at bedtime as needed. (Patient taking differently: Place 1 application  into both eyes at bedtime as needed (Dry eyes).) 10.5 g 3   nitroGLYCERIN (NITROLINGUAL) 0.4 MG/SPRAY spray Place 1 spray under the tongue every 5 (five) minutes as needed for chest pain. 12 g 3   NON FORMULARY Take 1 tablet by mouth daily as needed (Stomach Bloat). FD GUARD     ondansetron (ZOFRAN) 4 MG tablet Take 1 tablet (4 mg total) by mouth every 8 (eight) hours as needed for nausea or vomiting. 30 tablet 1   prednisoLONE acetate (PRED FORTE) 1 % ophthalmic suspension Place 1 drop into the right eye 3 (three) times daily.     Probiotic Product (PROBIOTIC DAILY PO) Take 1 capsule by mouth daily. VSL #3     Rimegepant Sulfate (NURTEC) 75 MG TBDP Take 1 tablet by mouth as needed.     rizatriptan (MAXALT-MLT) 10 MG disintegrating tablet Take 1 tablet (10 mg total)  by mouth as needed for migraine. May repeat in 2 hours if needed. (ok to send out this medication to the patient, the patient can tolerate this medication and will be monitored, if this is not acceptable please cancel this script and send in a new erx with a note, thanks) 9 tablet 6   UNABLE TO FIND Place 1 application rectally daily as needed. [facility]  Med Name: lidocaine 5% pramoxine 1% ointment per rectum as needed.     zolpidem (AMBIEN) 10 MG tablet Take 1 tablet (10 mg total) by mouth at bedtime as needed for sleep. 90 tablet 1   irbesartan (AVAPRO) 150 MG tablet Take 0.5 tablets (75 mg total) by mouth daily. 30 tablet 0   No facility-administered medications prior to visit.    Allergies  Allergen Reactions   Nutmeg Oil (Myristica Oil) Anaphylaxis    Nut oil- skin irritation. Nut oil- skin irritation.   Adhesive [Tape] Other (See Comments)    Blisters skin   Aspirin Other (See Comments)    sickle cell trait--  Not recommended unless emergency   Imitrex  [Sumatriptan] Hives   Keflex [Cephalexin] Other (See Comments)    G6PD   Levaquin [Levofloxacin In D5w]     Altered mental status Affects G6PD   Other     Nut Oil- skin irritation    Statins Other (See Comments)    Myopathy - elevated CK Myopathy - elevated CK Myopathy-elevated CK   Sulfa Antibiotics Other (See Comments)    Patient has sickle cell trait Sickle cell trait.   Sulfonamide Derivatives Other (See Comments)    Patient has sickle cell trait   Latex Rash    Review of Systems  Constitutional:  Positive for unexpected weight change. Negative for chills and fever.  HENT:  Positive for congestion. Negative for sinus pressure and sinus pain.   Eyes:  Positive for photophobia, pain and visual disturbance. Negative for discharge.  Respiratory:  Positive for cough and shortness of breath.   Cardiovascular:  Negative for chest pain and palpitations.  Gastrointestinal:  Positive for constipation. Negative for abdominal pain, blood in stool, diarrhea, nausea and vomiting.  Endocrine: Negative for polydipsia and polyuria.  Genitourinary:  Negative for dysuria and hematuria.  Musculoskeletal:  Positive for arthralgias, back pain, neck pain and neck stiffness.  Skin:  Negative for rash.  Neurological:  Positive for headaches. Negative for weakness.  Psychiatric/Behavioral:  Negative for agitation and behavioral problems.        Objective:    Physical Exam Vitals reviewed.  Constitutional:      General: She is not in acute distress.    Appearance: She is not diaphoretic.  HENT:     Head: Normocephalic and atraumatic.     Nose: Congestion present.     Mouth/Throat:     Mouth: Mucous membranes are moist.  Eyes:     General: No scleral icterus.    Extraocular Movements: Extraocular movements intact.  Cardiovascular:     Rate and Rhythm: Normal rate and regular rhythm.     Pulses: Normal pulses.     Heart sounds: Normal heart sounds. No murmur heard. Pulmonary:      Breath sounds: Wheezing (Mild, diffuse) present. No rales.  Musculoskeletal:        General: Tenderness (Lower lumbar spine area) present.     Left hand: Decreased strength of thumb/finger opposition.     Cervical back: Neck supple. No tenderness.     Right lower leg: No  edema.     Left lower leg: No edema.  Skin:    General: Skin is warm.     Findings: No rash.  Neurological:     General: No focal deficit present.     Mental Status: She is alert and oriented to person, place, and time.     Sensory: No sensory deficit.     Motor: No weakness.  Psychiatric:        Mood and Affect: Mood normal.        Behavior: Behavior normal.     BP 129/75 (BP Location: Right Arm, Patient Position: Sitting, Cuff Size: Large)   Pulse 96   Ht 5\' 7"  (1.702 m)   Wt 215 lb 9.6 oz (97.8 kg)   SpO2 90%   BMI 33.77 kg/m  Wt Readings from Last 3 Encounters:  08/27/23 215 lb 9.6 oz (97.8 kg)  08/10/23 214 lb 9.6 oz (97.3 kg)  06/11/23 218 lb (98.9 kg)        Assessment & Plan:   Problem List Items Addressed This Visit       Cardiovascular and Mediastinum   Essential hypertension, benign   Relevant Medications   irbesartan (AVAPRO) 150 MG tablet     Respiratory   Asthma    Likely has asthma exacerbation/acute bronchitis Started Medrol dosepak On Symbicort, switched to Trelegy - today, she reports that she had done better with it, sample provided Albuterol PRN for dyspnea or wheezing Has albuterol nebulizer as well as she responds better to nebulization Tussionex PRN for cough      Relevant Medications   Fluticasone-Umeclidin-Vilant (TRELEGY ELLIPTA) 100-62.5-25 MCG/ACT AEPB   methylPREDNISolone (MEDROL DOSEPAK) 4 MG TBPK tablet   Acute bronchitis - Primary    Started Medrol Dosepak Started Trelegy for asthma instead of Symbicort, sample provided Continue as needed albuterol nebulization for dyspnea or wheezing Tussionex PRN for cough      Relevant Medications    methylPREDNISolone (MEDROL DOSEPAK) 4 MG TBPK tablet     Meds ordered this encounter  Medications   Fluticasone-Umeclidin-Vilant (TRELEGY ELLIPTA) 100-62.5-25 MCG/ACT AEPB    Sig: Inhale 1 puff into the lungs daily.    Dispense:  1 each    Refill:  11    Discontinue Symbicort   irbesartan (AVAPRO) 150 MG tablet    Sig: Take 0.5 tablets (75 mg total) by mouth daily.    Dispense:  15 tablet    Refill:  0   methylPREDNISolone (MEDROL DOSEPAK) 4 MG TBPK tablet    Sig: Take as package instructions.    Dispense:  1 each    Refill:  0     Emila Steinhauser Concha Se, MD

## 2023-08-27 NOTE — Assessment & Plan Note (Signed)
Likely has asthma exacerbation/acute bronchitis Started Medrol dosepak On Symbicort, switched to Trelegy - today, she reports that she had done better with it, sample provided Albuterol PRN for dyspnea or wheezing Has albuterol nebulizer as well as she responds better to nebulization Tussionex PRN for cough

## 2023-08-27 NOTE — Assessment & Plan Note (Addendum)
Started Medrol Dosepak Started Trelegy for asthma instead of Symbicort, sample provided Continue as needed albuterol nebulization for dyspnea or wheezing Tussionex PRN for cough

## 2023-08-27 NOTE — Patient Instructions (Addendum)
Please start taking Prednisone as prescribed.  Start using Trelegy instead of Symbicort.  Please continue to take other medications as prescribed.

## 2023-09-16 ENCOUNTER — Encounter: Payer: Self-pay | Admitting: Orthopaedic Surgery

## 2023-09-16 ENCOUNTER — Other Ambulatory Visit (INDEPENDENT_AMBULATORY_CARE_PROVIDER_SITE_OTHER): Payer: Medicare Other

## 2023-09-16 ENCOUNTER — Ambulatory Visit: Payer: Medicare Other | Admitting: Orthopaedic Surgery

## 2023-09-16 ENCOUNTER — Telehealth: Payer: Self-pay

## 2023-09-16 VITALS — Ht 67.0 in | Wt 271.0 lb

## 2023-09-16 DIAGNOSIS — M25571 Pain in right ankle and joints of right foot: Secondary | ICD-10-CM | POA: Diagnosis not present

## 2023-09-16 DIAGNOSIS — M79671 Pain in right foot: Secondary | ICD-10-CM

## 2023-09-16 DIAGNOSIS — G8929 Other chronic pain: Secondary | ICD-10-CM

## 2023-09-16 MED ORDER — HYDROCODONE-ACETAMINOPHEN 5-325 MG PO TABS
1.0000 | ORAL_TABLET | Freq: Four times a day (QID) | ORAL | 0 refills | Status: DC | PRN
Start: 2023-09-16 — End: 2023-09-16

## 2023-09-16 MED ORDER — HYDROCODONE-ACETAMINOPHEN 5-325 MG PO TABS: 1.0000 | ORAL_TABLET | Freq: Four times a day (QID) | ORAL | 0 refills | Status: AC | PRN

## 2023-09-16 MED ORDER — HYDROCODONE-ACETAMINOPHEN 5-325 MG PO TABS
ORAL_TABLET | ORAL | 0 refills | Status: DC
Start: 2023-09-16 — End: 2023-10-04

## 2023-09-16 NOTE — Telephone Encounter (Signed)
Done

## 2023-09-16 NOTE — Progress Notes (Signed)
Dg right an

## 2023-09-16 NOTE — Addendum Note (Signed)
Addended by: Earnstine Regal on: 09/16/2023 10:49 AM   Modules accepted: Orders

## 2023-09-16 NOTE — Progress Notes (Signed)
She dropped a can of soup on the right foot.  She has had increasing pain of the right foot since then with also some heel pain.  She has a limp.  She has no redness or bruising.  Most of the pain is in the mid foot.  Right foot has tenderness of metatarsal head areas, more medially.  ROM of ankle is full but tender.  There is some slight plantar heel pain.  Limp to the right.  NV intact.  X-rays were done of the right foot and ankle, reported separately.  Encounter Diagnoses  Name Primary?   Acute right ankle pain Yes   Acute foot pain, right    X-rays were negative for fracture.  I will give CAM walker and contrast bath instructions.  I have reviewed the [place] Controlled Substance Reporting System web site prior to prescribing narcotic medicine for this patient.  Return in one month.  Call and cancel if doing well.  Call if any problem.  Precautions discussed.  Electronically Signed Darreld Mclean, MD 12/12/202410:43 AM

## 2023-09-22 ENCOUNTER — Other Ambulatory Visit: Payer: Self-pay | Admitting: Internal Medicine

## 2023-09-22 DIAGNOSIS — R110 Nausea: Secondary | ICD-10-CM

## 2023-10-04 ENCOUNTER — Telehealth: Payer: Self-pay

## 2023-10-04 ENCOUNTER — Ambulatory Visit (INDEPENDENT_AMBULATORY_CARE_PROVIDER_SITE_OTHER): Payer: Medicare Other | Admitting: Internal Medicine

## 2023-10-04 ENCOUNTER — Encounter: Payer: Self-pay | Admitting: Internal Medicine

## 2023-10-04 VITALS — BP 130/74 | HR 91 | Ht 67.0 in | Wt 210.6 lb

## 2023-10-04 DIAGNOSIS — M51372 Other intervertebral disc degeneration, lumbosacral region with discogenic back pain and lower extremity pain: Secondary | ICD-10-CM

## 2023-10-04 DIAGNOSIS — J4530 Mild persistent asthma, uncomplicated: Secondary | ICD-10-CM

## 2023-10-04 DIAGNOSIS — I10 Essential (primary) hypertension: Secondary | ICD-10-CM

## 2023-10-04 DIAGNOSIS — I209 Angina pectoris, unspecified: Secondary | ICD-10-CM | POA: Insufficient documentation

## 2023-10-04 DIAGNOSIS — E039 Hypothyroidism, unspecified: Secondary | ICD-10-CM | POA: Diagnosis not present

## 2023-10-04 DIAGNOSIS — R42 Dizziness and giddiness: Secondary | ICD-10-CM | POA: Insufficient documentation

## 2023-10-04 DIAGNOSIS — J4531 Mild persistent asthma with (acute) exacerbation: Secondary | ICD-10-CM | POA: Diagnosis not present

## 2023-10-04 DIAGNOSIS — J0101 Acute recurrent maxillary sinusitis: Secondary | ICD-10-CM | POA: Insufficient documentation

## 2023-10-04 DIAGNOSIS — I2089 Other forms of angina pectoris: Secondary | ICD-10-CM | POA: Insufficient documentation

## 2023-10-04 MED ORDER — NITROGLYCERIN 0.4 MG/SPRAY TL SOLN
1.0000 | 3 refills | Status: AC | PRN
Start: 2023-10-04 — End: ?

## 2023-10-04 MED ORDER — HYDROCODONE-ACETAMINOPHEN 5-325 MG PO TABS: 1.0000 | ORAL_TABLET | ORAL | 0 refills | Status: DC | PRN

## 2023-10-04 MED ORDER — AZITHROMYCIN 250 MG PO TABS
ORAL_TABLET | ORAL | 0 refills | Status: AC
Start: 2023-10-04 — End: 2023-10-09

## 2023-10-04 MED ORDER — MECLIZINE HCL 12.5 MG PO TABS
12.5000 mg | ORAL_TABLET | Freq: Three times a day (TID) | ORAL | 3 refills | Status: DC | PRN
Start: 2023-10-04 — End: 2024-02-06

## 2023-10-04 MED ORDER — HYDROCODONE-ACETAMINOPHEN 5-325 MG PO TABS: 1.0000 | ORAL_TABLET | Freq: Three times a day (TID) | ORAL | 0 refills | Status: DC | PRN

## 2023-10-04 MED ORDER — TRELEGY ELLIPTA 100-62.5-25 MCG/ACT IN AEPB
1.0000 | INHALATION_SPRAY | Freq: Every day | RESPIRATORY_TRACT | 4 refills | Status: DC
Start: 2023-10-04 — End: 2024-02-06

## 2023-10-04 MED ORDER — IRBESARTAN 150 MG PO TABS
75.0000 mg | ORAL_TABLET | Freq: Every day | ORAL | 1 refills | Status: DC
Start: 2023-10-04 — End: 2023-12-23

## 2023-10-04 MED ORDER — HYDROCOD POLI-CHLORPHE POLI ER 10-8 MG/5ML PO SUER: 5.0000 mL | Freq: Two times a day (BID) | ORAL | 0 refills | Status: DC | PRN

## 2023-10-04 MED ORDER — MECLIZINE HCL 12.5 MG PO TABS: 12.5000 mg | ORAL_TABLET | Freq: Three times a day (TID) | ORAL | 3 refills | Status: DC | PRN

## 2023-10-04 NOTE — Addendum Note (Signed)
Addended byTrena Platt on: 10/04/2023 06:14 PM   Modules accepted: Orders

## 2023-10-04 NOTE — Assessment & Plan Note (Addendum)
  BP Readings from Last 1 Encounters:  10/04/23 130/74   Well-controlled with Irbesartan 75 mg every day -advised to hold irbesartan if she has dizziness Counseled for compliance with the medications Advised DASH diet and moderate exercise/walking as tolerated

## 2023-10-04 NOTE — Patient Instructions (Signed)
Please start taking Azithromycin and use nasal spray for sinusitis.  Please continue to take medications as prescribed.  Please continue to follow low carb diet and ambulate as tolerated.

## 2023-10-04 NOTE — Addendum Note (Signed)
Addended byTrena Platt on: 10/04/2023 06:10 PM   Modules accepted: Orders

## 2023-10-04 NOTE — Assessment & Plan Note (Signed)
Has nitroglycerin as needed for pain, has not required it recently - refilled upon patient request Followed by cardiology

## 2023-10-04 NOTE — Telephone Encounter (Signed)
Copied from CRM [id number]. Topic: Clinical - Medication Question >> Oct 04, 2023  3:07 PM Shelah Lewandowsky wrote: Reason for CRM: Patient needs to have premeds sent to [facility] before dental appointment, please call [phone]  with any questions

## 2023-10-04 NOTE — Assessment & Plan Note (Signed)
Has intermittent dizziness, improves with meclizine as needed -refilled meclizine

## 2023-10-04 NOTE — Assessment & Plan Note (Signed)
Has had asthma exacerbation/acute bronchitis recently Completed Medrol dosepak Better with Trelegy now Albuterol PRN for dyspnea or wheezing Has albuterol nebulizer as well as she responds better to nebulization Tussionex PRN for cough

## 2023-10-04 NOTE — Assessment & Plan Note (Signed)
Started empiric azithromycin as she has persistent symptoms despite symptomatic treatment Nasal saline spray as needed for nasal congestion Advised to use humidifier and/or vaporizer for nasal congestion

## 2023-10-04 NOTE — Progress Notes (Signed)
Established Patient Office Visit  Subjective:  Patient ID: [name], female    DOB: 01-Sep-1951  Age: 72 y.o. MRN: [id number]  CC:  Chief Complaint  Patient presents with   Hypertension    Four month follow up    Insomnia    Four month follow up    Sinusitis    Sinus infection     HPI [name] is a 72 y.o. female with past medical history of HTN, GERD, IBS-C, NASH, anal fissure, hypothyroidism, OA, DDD of lumbar spine, anxiety/panic disorder, insomnia, left sided blind eye, G6PD mutation and sickle cell trait who presents for f/u of her chronic medical conditions.  HTN: BP is well-controlled. Takes medications regularly. Patient denies dizziness, chest pain, dyspnea or palpitations.  Asthma: She has started using Trelegy instead of Symbicort, and has felt better now.  Denies any dyspnea or wheezing currently.  She was recently treated with Medrol Dosepak for acute bronchitis.  Today, she reports nasal congestion, postnasal drip, sinus pressure related left sided facial pain and headache, and sore throat for the last 5 days.  Denies any fever or chills.  She has noticed an improvement in daily headache recently, which is generalized, and worse with stress.  She takes Maxalt as needed for migraine.  She had neurology evaluation and and was given gabapentin, but she did not try it after reading its side effects.  She has tried Tylenol for it with some relief. Denies any focal numbness or tingling. She has Xanax for severe anxiety/panic episode, but does not require it frequently.  She denies anhedonia, SI or HI. She takes Ambien for insomnia.  She has history of DDD of lumbosacral spine.  She has tried taking Norco for severe pain with relief.  Denies any recent injury or fall.  Denies saddle anesthesia, urinary or stool incontinence.  She has very limited vision in the right eye after cataract surgery (tunnel vision).  She has been having difficulty ambulating even at home,  but denies any recent fall.  She has recently noticed peripheral vision loss and has gray spot in the center.  She is followed by ophthalmology.  Denies any eye pain or discharge currently.      Past Medical History:  Diagnosis Date   Anal fissure    Anxiety    Arthritis    Asthma    C. difficile diarrhea 09/17/2021   Cataract    Phreesia 01/05/2021   Chest pain    a. 02/2000 Cath: nl cors, EF 60%;  b. 10/22/2010 MV: nl LV, no ischemia/infarct;  c. 03/2014 Admit c/p, r/o->grief from husbands death.   Chronic RUQ pain 10/23/2007   EUS slightly dilated CBD (7.33mm), otherwise nl   Depression    Diverticulosis    Eczema    Exposure to chemical inhalation mid 1970s   Fatty liver    G6PD deficiency    Gallstones    GERD (gastroesophageal reflux disease)    Glaucoma    History of pulmonary embolus (PE)    Treated with Eliquis 10-22-14   HTN (hypertension)    Hypercholesteremia    a. intolerant to statins.   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 She has limited vision in the right eye.   Migraines    inner ocular   Ocular migraine    Palpitations    Pleurisy  Pneumonia 07/16/2021   Patient was exposed to a harsh chemical in the 1970's that created scar tissue in her lungs. She has had pneumonia several times in the past.   PONV (postoperative nausea and vomiting)    Pulmonary emboli (HCC)    Recurrent upper respiratory infection (URI)    Renal cyst    Retinal cyst    Sickle cell trait (HCC)    Tubular adenoma of colon 09/17/2011   Urticaria     Past Surgical History:  Procedure Laterality Date   ABDOMINAL HYSTERECTOMY     ADENOIDECTOMY     ANGIOPLASTY     APPENDECTOMY     BIOPSY N/A 03/14/2013   Procedure: SMALL BOWEL AND GASTRIC BIOPSIES (Procedure #1);  Surgeon: West Bali, MD;  Location: [facility];  Service: Endoscopy;  Laterality: N/A;   CARDIAC CATHETERIZATION Left 02/11/2000    CATARACT EXTRACTION Left    CATARACT EXTRACTION W/PHACO Right 03/19/2021   Procedure: CATARACT EXTRACTION PHACO AND INTRAOCULAR LENS PLACEMENT (IOC) RIGHT 6.75 00:50.4;  Surgeon: Lockie Mola, MD;  Location: [facility];  Service: Ophthalmology;  Laterality: Right;   CHOLECYSTECTOMY  12/2007   COLONOSCOPY  12/22/2010   XLK:[id number], cecal adenomatous polyp   COLONOSCOPY WITH PROPOFOL N/A 03/31/2016   Dr. Darrick Penna: four sessile polyps rectum/sigmoid colon, diverticulosi, ext/int hemorrhoids. hyperplastic polyps, next tcs 5 years.   COLONOSCOPY WITH PROPOFOL N/A 04/21/2021   anal fissure, non-bleeding internal hemorrhoids, right colon diverticulosis, one 2 mm polyp in ascending, three 4-6 mm polyps in descending colon. Tubular adenomas. 5 year surveillance.   complete hysterectomy     ENTEROSCOPY N/A 03/14/2013   VOZ:DGUY gastritis/ulcers has healed   ESOPHAGOGASTRODUODENOSCOPY  09/22/2011   QIH:KVQQ gastritis/Duodenitis   EXCISIONAL HEMORRHOIDECTOMY     FLEXIBLE SIGMOIDOSCOPY N/A 03/14/2013   SLF:3 colon polyp removed/moderate sized internal hemorrhoids   FLEXIBLE SIGMOIDOSCOPY N/A 06/09/2016   Procedure: FLEXIBLE SIGMOIDOSCOPY;  Surgeon: West Bali, MD;  Location: [facility];  Service: Endoscopy;  Laterality: N/A;  rectal polyps times 2   FLEXIBLE SIGMOIDOSCOPY N/A 03/18/2018   Procedure: FLEXIBLE SIGMOIDOSCOPY;  Surgeon: West Bali, MD;  Location: [facility];  Service: Endoscopy;  Laterality: N/A;  9:15am   FOOT SURGERY     bunion removal left   GIVENS CAPSULE STUDY N/A 02/10/2013   Procedure: GIVENS CAPSULE STUDY;  Surgeon: West Bali, MD;  Location: [facility];  Service: Endoscopy;  Laterality: N/A;  730   HEEL SPUR EXCISION     right    HEMORRHOID BANDING N/A 03/14/2013   Procedure: HEMORRHOID BANDING (Procedure #3)  3 bands applied [id number] Exp 02/02/2014 ;  Surgeon: West Bali, MD;  Location: [facility];  Service: Endoscopy;  Laterality: N/A;    HEMORRHOID SURGERY N/A 04/24/2016   Procedure: EXTENSIVE HEMORRHOIDECTOMY;  Surgeon: Ancil Linsey, MD;  Location: [facility];  Service: General;  Laterality: N/A;   LAPAROSCOPY     adhesions   POLYPECTOMY N/A 03/14/2013   PIR:JJOA Gastritis . ULCERS SEEN ON MAY 6 HAVE HEALED   POLYPECTOMY  03/31/2016   Procedure: POLYPECTOMY;  Surgeon: West Bali, MD;  Location: [facility];  Service: Endoscopy;;  sigmoid colon polyps x2, rectal polyps x2   POLYPECTOMY  04/21/2021   Procedure: POLYPECTOMY;  Surgeon: Lanelle Bal, DO;  Location: [facility];  Service: Endoscopy;;   RECTAL EXAM UNDER ANESTHESIA N/A 07/23/2021   Procedure: RECTAL EXAM UNDER ANESTHESIA;  Surgeon: Romie Levee, MD;  Location: [facility];  Service: General;  Laterality: N/A;   SPHINCTEROTOMY N/A 07/23/2021   Procedure: CHEMICAL SPHINCTEROTOMY;  Surgeon: Romie Levee, MD;  Location: [facility] [place];  Service: General;  Laterality: N/A;   TONSILLECTOMY     TUBAL LIGATION      Family History  Problem Relation Age of Onset   Colon cancer Mother        66s   Urticaria Mother    Heart disease Mother    Colon polyps Mother    Cancer Father        oral   Crohn's disease Sister    Cancer Sister        around heart   Colon polyps Sister    Diabetes Brother    Other Brother        intestinal sugery   Colon polyps Brother    Colon cancer Maternal Grandmother    Colon cancer Maternal Grandfather    Colon cancer Paternal Grandfather    Anesthesia problems Neg Hx     Social History   Socioeconomic History   Marital status: Widowed    Spouse name: Not on file   Number of children: 1   Years of education: Not on file   Highest education level: Not on file  Occupational History   Occupation: homemaker    Employer: UNEMPLOYED  Tobacco Use   Smoking status: Former    Current packs/day: 0.00    Average packs/day: 0.5 packs/day for 30.0 years (15.0 ttl pk-yrs)    Types:  Cigarettes    Start date: 10/15/1966    Quit date: 10/15/1996    Years since quitting: 26.9   Smokeless tobacco: Never   Tobacco comments:    Stopped smoking ~ 2000  Vaping Use   Vaping status: Never Used  Substance and Sexual Activity   Alcohol use: Not Currently    Alcohol/week: 4.0 standard drinks of alcohol    Types: 4 Glasses of wine per week    Comment: 1 drinks every couple of weeks   Drug use: No   Sexual activity: Not Currently    Birth control/protection: Surgical  Other Topics Concern   Not on file  Social History Narrative   Does not routinely exercise. Husband passed in JUN 2015 due to prostate ca.   Social Drivers of Corporate investment banker Strain: Low Risk  (06/11/2023)   Overall Financial Resource Strain (CARDIA)    Difficulty of Paying Living Expenses: Not hard at all  Food Insecurity: No Food Insecurity (06/11/2023)   Hunger Vital Sign    Worried About Running Out of Food in the Last Year: Never true    Ran Out of Food in the Last Year: Never true  Transportation Needs: No Transportation Needs (06/11/2023)   PRAPARE - Administrator, Civil Service (Medical): No    Lack of Transportation (Non-Medical): No  Physical Activity: Sufficiently Active (06/11/2023)   Exercise Vital Sign    Days of Exercise per Week: 6 days    Minutes of Exercise per Session: 30 min  Stress: No Stress Concern Present (06/11/2023)   [employer] of Occupational Health - Occupational Stress Questionnaire    Feeling of Stress : Not at all  Social Connections: Socially Isolated (06/11/2023)   Social Connection and Isolation Panel [NHANES]    Frequency of Communication with Friends and Family: Three times a week    Frequency of Social Gatherings with Friends and Family: Once a week  Attends Religious Services: Never    Active Member of Clubs or Organizations: No    Attends Banker Meetings: Never    Marital Status: Widowed  Intimate Partner Violence: Not At  Risk (06/11/2023)   Humiliation, Afraid, Rape, and Kick questionnaire    Fear of Current or Ex-Partner: No    Emotionally Abused: No    Physically Abused: No    Sexually Abused: No    Outpatient Medications Prior to Visit  Medication Sig Dispense Refill   albuterol (PROVENTIL) (2.5 MG/3ML) 0.083% nebulizer solution Take 3 mLs (2.5 mg total) by nebulization every 4 (four) hours as needed for wheezing or shortness of breath. 150 mL 3   albuterol (VENTOLIN HFA) 108 (90 Base) MCG/ACT inhaler Inhale 2 puffs into the lungs every 4 (four) hours as needed for wheezing. 54 g 3   ALPRAZolam (XANAX) 1 MG tablet Take 1 tablet (1 mg total) by mouth 2 (two) times daily as needed for anxiety. (Patient taking differently: Take 0.5-1 mg by mouth at bedtime.) 180 tablet 3   brimonidine (ALPHAGAN) 0.2 % ophthalmic solution Place 1 drop into both eyes 2 (two) times daily. 15 mL 3   Cholecalciferol (VITAMIN D3) 50 MCG (2000 UT) TABS Take 2,000 Units by mouth daily.     dexlansoprazole (DEXILANT) 60 MG capsule Take 1 capsule (60 mg total) by mouth daily. 90 capsule 3   diclofenac Sodium (VOLTAREN) 1 % GEL Apply 4 g topically 4 (four) times daily. 100 g 2   DUREZOL 0.05 % EMUL Place 1 drop into the right eye daily.     estradiol (ESTRACE VAGINAL) 0.1 MG/GM vaginal cream Place 1 Applicatorful vaginally daily as needed. 42.5 g 3   ezetimibe (ZETIA) 10 MG tablet Take 1 tablet (10 mg total) by mouth daily. 90 tablet 3   fenofibrate (TRICOR) 145 MG tablet Take 1 tablet (145 mg total) by mouth daily. 90 tablet 3   hyoscyamine (ANASPAZ) 0.125 MG TBDP disintergrating tablet Place 1 tablet (0.125 mg total) under the tongue daily as needed (IBS). Place 0.125 mg under the tongue daily as needed (IBS). 30 tablet 0   latanoprost (XALATAN) 0.005 % ophthalmic solution Place 1 drop into both eyes at bedtime. 9 mL 3   levothyroxine (SYNTHROID) 25 MCG tablet Take 1 tablet (25 mcg total) by mouth daily. 90 tablet 1    Lidocaine-Hydrocortisone Ace 3-2.5 % KIT APPLY TO RECTUM QID for 2 weeks then AS NEEDED FOR RECTAL PAIN OR BLEEDING 1 kit 11   Lifitegrast (XIIDRA) 5 % SOLN INSTILL 1 DROP IN BOTH EYES EVERY DAY AS NEEDED (EACH CONTAINER SHOULD YIELD 2 DROPS) STORE IN ORIGINAL FOIL POUCH (Patient taking differently: Place 1 drop into both eyes daily. (EACH CONTAINER SHOULD YIELD 2 DROPS) STORE IN ORIGINAL FOIL POUCH) 60 each 3   Magnesium 500 MG TABS Take 1 tablet by mouth 2 (two) times daily with a meal.     Menthol, Topical Analgesic, (BIOFREEZE EX) Apply 1 application topically daily as needed (pain).     metroNIDAZOLE (METROCREAM) 0.75 % cream Apply 1 application. topically as needed. 45 g 3   naproxen (NAPROSYN) 500 MG tablet Take 1 tablet (500 mg total) by mouth 2 (two) times daily with a meal. 60 tablet 5   neomycin-polymyxin b-dexamethasone (MAXITROL) 3.5-10000-0.1 OINT Place 1 application into both eyes at bedtime as needed. (Patient taking differently: Place 1 application  into both eyes at bedtime as needed (Dry eyes).) 10.5 g 3   NON FORMULARY  Take 1 tablet by mouth daily as needed (Stomach Bloat). FD GUARD     ondansetron (ZOFRAN) 4 MG tablet TAKE ONE TABLET BY MOUTH EVERY 8 HOURS AS NEEDED FOR NAUSEA OR VOMITING 30 tablet 1   prednisoLONE acetate (PRED FORTE) 1 % ophthalmic suspension Place 1 drop into the right eye 3 (three) times daily.     Probiotic Product (PROBIOTIC DAILY PO) Take 1 capsule by mouth daily. VSL #3     Rimegepant Sulfate (NURTEC) 75 MG TBDP Take 1 tablet by mouth as needed.     rizatriptan (MAXALT-MLT) 10 MG disintegrating tablet Take 1 tablet (10 mg total) by mouth as needed for migraine. May repeat in 2 hours if needed. (ok to send out this medication to the patient, the patient can tolerate this medication and will be monitored, if this is not acceptable please cancel this script and send in a new erx with a note, thanks) 9 tablet 6   UNABLE TO FIND Place 1 application rectally daily  as needed. [facility]  Med Name: lidocaine 5% pramoxine 1% ointment per rectum as needed.     zolpidem (AMBIEN) 10 MG tablet Take 1 tablet (10 mg total) by mouth at bedtime as needed for sleep. 90 tablet 1   budesonide-formoterol (SYMBICORT) 160-4.5 MCG/ACT inhaler Inhale 2 puffs into the lungs as needed. 10.2 g 3   chlorpheniramine-HYDROcodone (TUSSIONEX) 10-8 MG/5ML Take 5 mLs by mouth every 12 (twelve) hours as needed for cough. 115 mL 0   Fluticasone-Umeclidin-Vilant (TRELEGY ELLIPTA) 100-62.5-25 MCG/ACT AEPB Inhale 1 puff into the lungs daily. 1 each 11   HYDROcodone-acetaminophen (NORCO/VICODIN) 5-325 MG tablet One tablet every four hours for pain. 30 tablet 0   irbesartan (AVAPRO) 150 MG tablet Take 0.5 tablets (75 mg total) by mouth daily. 15 tablet 0   meclizine (ANTIVERT) 12.5 MG tablet Take 1 tablet (12.5 mg total) by mouth 3 (three) times daily as needed for dizziness. (Patient taking differently: Take 12.5 mg by mouth 3 (three) times daily as needed (vertigo).) 30 tablet 3   methylPREDNISolone (MEDROL DOSEPAK) 4 MG TBPK tablet Take as package instructions. 1 each 0   nitroGLYCERIN (NITROLINGUAL) 0.4 MG/SPRAY spray Place 1 spray under the tongue every 5 (five) minutes as needed for chest pain. 12 g 3   No facility-administered medications prior to visit.    Allergies  Allergen Reactions   Nutmeg Oil (Myristica Oil) Anaphylaxis    Nut oil- skin irritation. Nut oil- skin irritation.   Adhesive [Tape] Other (See Comments)    Blisters skin   Aspirin Other (See Comments)    sickle cell trait--  Not recommended unless emergency   Imitrex [Sumatriptan] Hives   Keflex [Cephalexin] Other (See Comments)    G6PD   Levaquin [Levofloxacin In D5w]     Altered mental status Affects G6PD   Other     Nut Oil- skin irritation    Statins Other (See Comments)    Myopathy - elevated CK Myopathy - elevated CK Myopathy-elevated CK   Sulfa Antibiotics Other (See Comments)     Patient has sickle cell trait Sickle cell trait.   Sulfonamide Derivatives Other (See Comments)    Patient has sickle cell trait   Latex Rash    ROS Review of Systems  Constitutional:  Negative for chills and fever.  HENT:  Positive for congestion, sinus pressure, sinus pain and sore throat.   Eyes:  Positive for photophobia, pain and visual disturbance. Negative for discharge.  Respiratory:  Positive  for cough. Negative for shortness of breath and wheezing.   Cardiovascular:  Negative for chest pain and palpitations.  Gastrointestinal:  Positive for constipation. Negative for abdominal pain, blood in stool, diarrhea, nausea and vomiting.  Endocrine: Negative for polydipsia and polyuria.  Genitourinary:  Negative for dysuria and hematuria.  Musculoskeletal:  Positive for arthralgias, back pain, neck pain and neck stiffness.  Skin:  Negative for rash.  Neurological:  Positive for headaches. Negative for weakness.  Psychiatric/Behavioral:  Negative for agitation and behavioral problems.       Objective:    Physical Exam Vitals reviewed.  Constitutional:      General: She is not in acute distress.    Appearance: She is not diaphoretic.  HENT:     Head: Normocephalic and atraumatic.     Nose: Congestion present.     Left Sinus: Maxillary sinus tenderness present.     Mouth/Throat:     Mouth: Mucous membranes are moist.  Eyes:     General: No scleral icterus.    Extraocular Movements: Extraocular movements intact.  Cardiovascular:     Rate and Rhythm: Normal rate and regular rhythm.     Heart sounds: Normal heart sounds. No murmur heard. Pulmonary:     Breath sounds: Normal breath sounds. No wheezing or rales.  Musculoskeletal:        General: Tenderness (Lower lumbar spine area) present.     Left hand: Decreased strength of thumb/finger opposition.     Cervical back: Neck supple. No tenderness.     Right lower leg: No edema.     Left lower leg: No edema.  Skin:     General: Skin is warm.     Findings: No rash.  Neurological:     General: No focal deficit present.     Mental Status: She is alert and oriented to person, place, and time.     Sensory: No sensory deficit.     Motor: No weakness.  Psychiatric:        Mood and Affect: Mood normal.        Behavior: Behavior normal.     BP 130/74 (BP Location: Left Arm, Patient Position: Sitting, Cuff Size: Normal)   Pulse 91   Ht 5\' 7"  (1.702 m)   Wt 210 lb 9.6 oz (95.5 kg)   SpO2 93%   BMI 32.98 kg/m  Wt Readings from Last 3 Encounters:  10/04/23 210 lb 9.6 oz (95.5 kg)  09/16/23 271 lb (122.9 kg)  08/27/23 215 lb 9.6 oz (97.8 kg)    Lab Results  Component Value Date   TSH 2.855 04/28/2023   Lab Results  Component Value Date   WBC 8.4 09/14/2022   HGB 13.2 09/14/2022   HCT 39.5 09/14/2022   MCV 96.1 09/14/2022   PLT 195 09/14/2022   Lab Results  Component Value Date   NA 137 02/01/2023   K 4.4 02/01/2023   CO2 22 02/01/2023   GLUCOSE 98 02/01/2023   BUN 22 02/01/2023   CREATININE 1.24 (H) 02/01/2023   BILITOT 0.6 02/01/2023   ALKPHOS 61 02/01/2023   AST 34 02/01/2023   ALT 25 02/01/2023   PROT 6.7 02/01/2023   ALBUMIN 4.5 02/01/2023   CALCIUM 10.3 02/01/2023   ANIONGAP 9 10/28/2022   EGFR 47 (L) 02/01/2023   Lab Results  Component Value Date   CHOL 194 04/28/2023   Lab Results  Component Value Date   HDL 33 (L) 04/28/2023   Lab Results  Component  Value Date   LDLCALC 124 (H) 04/28/2023   Lab Results  Component Value Date   TRIG 184 (H) 04/28/2023   Lab Results  Component Value Date   CHOLHDL 5.9 04/28/2023   Lab Results  Component Value Date   HGBA1C 5.6 02/01/2023      Assessment & Plan:   Problem List Items Addressed This Visit       Cardiovascular and Mediastinum   Essential hypertension, benign - Primary    BP Readings from Last 1 Encounters:  10/04/23 130/74   Well-controlled with Irbesartan 75 mg every day -advised to hold irbesartan if  she has dizziness Counseled for compliance with the medications Advised DASH diet and moderate exercise/walking as tolerated      Relevant Medications   nitroGLYCERIN (NITROLINGUAL) 0.4 MG/SPRAY spray   irbesartan (AVAPRO) 150 MG tablet   Angina pectoris (HCC)   Has nitroglycerin as needed for pain, has not required it recently - refilled upon patient request Followed by cardiology       Relevant Medications   nitroGLYCERIN (NITROLINGUAL) 0.4 MG/SPRAY spray   irbesartan (AVAPRO) 150 MG tablet     Respiratory   Asthma   Has had asthma exacerbation/acute bronchitis recently Completed Medrol dosepak Better with Trelegy now Albuterol PRN for dyspnea or wheezing Has albuterol nebulizer as well as she responds better to nebulization Tussionex PRN for cough      Relevant Medications   chlorpheniramine-HYDROcodone (TUSSIONEX) 10-8 MG/5ML   Fluticasone-Umeclidin-Vilant (TRELEGY ELLIPTA) 100-62.5-25 MCG/ACT AEPB   Acute recurrent maxillary sinusitis   Started empiric azithromycin as she has persistent symptoms despite symptomatic treatment Nasal saline spray as needed for nasal congestion Advised to use humidifier and/or vaporizer for nasal congestion      Relevant Medications   azithromycin (ZITHROMAX) 250 MG tablet   chlorpheniramine-HYDROcodone (TUSSIONEX) 10-8 MG/5ML     Endocrine   Hypothyroidism   Lab Results  Component Value Date   TSH 2.855 04/28/2023   Was on Levothyroxine 25 mcg once daily Follows up with Dr Fransico Him Checked TSH and free T4        Musculoskeletal and Integument   DDD (degenerative disc disease), lumbosacral   Avoid heavy lifting and frequent bending Advised to use back brace and/or heating pad as needed Norco as needed for severe pain      Relevant Medications   HYDROcodone-acetaminophen (NORCO/VICODIN) 5-325 MG tablet     Other   Vertigo   Has intermittent dizziness, improves with meclizine as needed -refilled meclizine      Relevant  Medications   meclizine (ANTIVERT) 12.5 MG tablet    Meds ordered this encounter  Medications   azithromycin (ZITHROMAX) 250 MG tablet    Sig: Take 2 tablets on day 1, then 1 tablet daily on days 2 through 5    Dispense:  6 tablet    Refill:  0   nitroGLYCERIN (NITROLINGUAL) 0.4 MG/SPRAY spray    Sig: Place 1 spray under the tongue every 5 (five) minutes as needed for chest pain.    Dispense:  12 g    Refill:  3   DISCONTD: meclizine (ANTIVERT) 12.5 MG tablet    Sig: Take 1 tablet (12.5 mg total) by mouth 3 (three) times daily as needed for dizziness.    Dispense:  30 tablet    Refill:  3    FAX TO: [facility] [phone]   DISCONTD: HYDROcodone-acetaminophen (NORCO/VICODIN) 5-325 MG tablet    Sig: Take 1 tablet by mouth every  4 (four) hours as needed for moderate pain (pain score 4-6). One tablet every four hours for pain.    Dispense:  30 tablet    Refill:  0   chlorpheniramine-HYDROcodone (TUSSIONEX) 10-8 MG/5ML    Sig: Take 5 mLs by mouth every 12 (twelve) hours as needed for cough.    Dispense:  115 mL    Refill:  0   irbesartan (AVAPRO) 150 MG tablet    Sig: Take 0.5 tablets (75 mg total) by mouth daily.    Dispense:  45 tablet    Refill:  1   DISCONTD: HYDROcodone-acetaminophen (NORCO/VICODIN) 5-325 MG tablet    Sig: Take 1 tablet by mouth every 4 (four) hours as needed for moderate pain (pain score 4-6).    Dispense:  30 tablet    Refill:  0   Fluticasone-Umeclidin-Vilant (TRELEGY ELLIPTA) 100-62.5-25 MCG/ACT AEPB    Sig: Inhale 1 puff into the lungs daily.    Dispense:  3 each    Refill:  4    Discontinue Symbicort   meclizine (ANTIVERT) 12.5 MG tablet    Sig: Take 1 tablet (12.5 mg total) by mouth 3 (three) times daily as needed for dizziness.    Dispense:  30 tablet    Refill:  3    FAX TO: [facility] [phone]   HYDROcodone-acetaminophen (NORCO/VICODIN) 5-325 MG tablet    Sig: Take 1 tablet by mouth every 4 (four) hours as needed  for moderate pain (pain score 4-6).    Dispense:  30 tablet    Refill:  0    Follow-up: Return in about 4 months (around 02/02/2024) for HTN and asthma.    Anabel Halon, MD

## 2023-10-04 NOTE — Assessment & Plan Note (Signed)
Lab Results  Component Value Date   TSH 2.855 04/28/2023   Was on Levothyroxine 25 mcg once daily Follows up with Dr Fransico Him Checked TSH and free T4

## 2023-10-04 NOTE — Assessment & Plan Note (Signed)
Avoid heavy lifting and frequent bending Advised to use back brace and/or heating pad as needed Norco as needed for severe pain

## 2023-10-11 ENCOUNTER — Telehealth: Payer: Self-pay | Admitting: Neurology

## 2023-10-11 NOTE — Telephone Encounter (Signed)
 Pt asking to speak with [name] in regards to transportation setup. Reached out to [name] who stated she will call her back

## 2023-10-14 ENCOUNTER — Ambulatory Visit: Payer: Medicare Other | Admitting: Orthopaedic Surgery

## 2023-10-25 ENCOUNTER — Other Ambulatory Visit: Payer: Self-pay

## 2023-10-25 ENCOUNTER — Telehealth: Payer: Self-pay

## 2023-10-25 ENCOUNTER — Other Ambulatory Visit: Payer: Self-pay | Admitting: Internal Medicine

## 2023-10-25 ENCOUNTER — Ambulatory Visit: Payer: Self-pay | Admitting: Internal Medicine

## 2023-10-25 DIAGNOSIS — J0101 Acute recurrent maxillary sinusitis: Secondary | ICD-10-CM

## 2023-10-25 MED ORDER — AZITHROMYCIN 250 MG PO TABS
ORAL_TABLET | ORAL | 0 refills | Status: AC
Start: 2023-10-25 — End: 2023-10-30

## 2023-10-25 MED ORDER — ALBUTEROL SULFATE HFA 108 (90 BASE) MCG/ACT IN AERS: 2.0000 | INHALATION_SPRAY | RESPIRATORY_TRACT | 3 refills | Status: DC | PRN

## 2023-10-25 NOTE — Telephone Encounter (Signed)
Copied from CRM [id number]. Topic: Clinical - Red Word Triage >> Oct 25, 2023  9:57 AM Dimitri Ped wrote: [facility] that prompted transfer to Nurse Triage: patient is calling cause doctor told her to call back concerning her sinus infection . I did send a message to the doctor concerning her needing to call back and letting him know how everything was going but patient mentioned breathing issues  and short breath   Chief Complaint: Shortness of breath Symptoms: Shortness of breath, generalized malaise, fever,  Frequency: This past week Pertinent Negatives: Patient denies chest pain except when coughing and it's only midsternal at that time. Disposition: [x] ED /[] Urgent Care (no appt availability in office) / [] Appointment(In office/virtual)/ []  [facility]/ [] Home Care/ [] Refused Recommended Disposition /[] [facility] [place] [facility]/ []  Follow-up with PCP Additional Notes: Patient called and advised that she was dusting last week, has asthma, and might have triggered her asthma this past week.  She states that she feels like she may have a cold or sinus infection.  She is blowing yellow mucous out of her nose.  Patient did two home covid tests and they were negative.  Patient states she felt better after taking recent antibiotics but then dusting at home might have made her feel worse. She was treated for a cough and bronchitis 08/27/2023 and also reevaluated on 10/04/2023.  She states that two days she was out of breath taking groceries inside.  Patient denies chest pain except when coughing and it's only midsternal at that time. A day or two before that she went to the mailbox and was very out of breath at that time.  She started to feel weak.  She went inside and sat down to rest.  Patient states that she had a fever 2 days ago of 101.2 and now it is around 99.1-100.1 since.  She has been taking Tylenol.  Patient is talking in full sentences during triage but it does sound like she getting short of  breath at the end of each sentence.  She states that she states she feels generally unwell.  She also has been experiencing nausea and has been taking nausea medication. She states that she feels weak and unwell. She states that she has been trying what she can at home including nebulizers and Vicks vapor rub on her chest.  She is advised that is it is recommended at this time based on symptoms to go to the emergency room for further evaluation.  Patient states that she has a pulse oximetry at home and yesterday it read 139 heart rate/89-92% Oxygen.  Today just prior to calling us and speaking with this Triage RN it read 87% Oxygen with a heart rate of 118.  Sitting, resting, while speaking with this RN, it was around 93% Oxygen and 102 pulse rate but it seems apparent that when ambulating the patient's oxygen levels are dropping in the 80s and her heart rate is increasing in the 110-140 range.  Patient is advised that with her symptoms, and then having a history of a pulmonary embolism, not getting better with treatment, it's recommended that she goes to the emergency room at this time.  Patient states that she will make a phone call to be able to go to the ER at this time.  She is also advised that if she gets worse or cannot find a ride, to call 911.  Patient verbalized understanding and wanted her PCP to know these updates on how she was doing.  Reason for Disposition  [1] MODERATE difficulty breathing (e.g., speaks in phrases, SOB even at rest, pulse 100-120) AND [2] NEW-onset or WORSE than normal  Answer Assessment - Initial Assessment Questions 1. RESPIRATORY STATUS: "Describe your breathing?" (e.g., wheezing, shortness of breath, unable to speak, severe coughing)      Patient states that she was out of breath walking to the mailbox 2. ONSET: "When did this breathing problem begin?"      This past week after dusting 3. PATTERN "Does the difficult breathing come and go, or has it been constant since  it started?"      Constant 4. SEVERITY: "How bad is your breathing?" (e.g., mild, moderate, severe)    - MILD: No SOB at rest, mild SOB with walking, speaks normally in sentences, can lie down, no retractions, pulse < 100.    - MODERATE: SOB at rest, SOB with minimal exertion and prefers to sit, cannot lie down flat, speaks in phrases, mild retractions, audible wheezing, pulse 100-120.    - SEVERE: Very SOB at rest, speaks in single words, struggling to breathe, sitting hunched forward, retractions, pulse > 120      93% and 102 pulse rate--sitting during triage 5. RECURRENT SYMPTOM: "Have you had difficulty breathing before?" If Yes, ask: "When was the last time?" and "What happened that time?"      No she states that this is different.  She feels weak now.   6. CARDIAC HISTORY: "Do you have any history of heart disease?" (e.g., heart attack, angina, bypass surgery, angioplasty)      Angina 7. 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 CAUSE: "What do you think is causing the breathing problem?"      Unknown 9. OTHER SYMPTOMS: "Do you have any other symptoms? (e.g., dizziness, runny nose, cough, chest pain, fever)     Runny nose and chest pain on at sternum with cough and sounds like a barking cough 10. O2 SATURATION MONITOR:  "Do you use an oxygen saturation monitor (pulse oximeter) at home?" If Yes, ask: "What is your reading (oxygen level) today?" "What is your usual oxygen saturation reading?" (e.g., 95%)       93% and 102 pulse rate--sitting during triage.  Patient states that it was 87% and 118 pulse rate prior to calling 12. TRAVEL: "Have you traveled out of the country in the last month?" (e.g., travel history, exposures)       No  Protocols used: Breathing Difficulty-A-AH

## 2023-10-25 NOTE — Telephone Encounter (Signed)
Copied from CRM [id number]. Topic: General - Other >> Oct 25, 2023  9:50 AM Alphonzo Lemmings O wrote: Reason for EAV:[id number] is calling cause she  saw doctor the last time and if the infection didn't go away to give him a call and to let him know how she was doing and that he would order her some more prescription . I'm doing ok and it came back and its the same time . My sinus in my chest . It knocks down but it want knock it out all the way . [phone] call back number . Patient doesn't know if the 5 day preacription is enough thinking it needs to be a longer prescription

## 2023-10-25 NOTE — Telephone Encounter (Signed)
Pt states she received it.

## 2023-10-26 ENCOUNTER — Telehealth: Payer: Self-pay | Admitting: Neurology

## 2023-10-26 NOTE — Telephone Encounter (Signed)
Appointment needed to be r/s

## 2023-10-28 ENCOUNTER — Ambulatory Visit: Payer: Medicare Other | Admitting: Neurology

## 2023-10-28 NOTE — Telephone Encounter (Signed)
Copied from CRM [id number]. Topic: General - Other >> Oct 28, 2023  3:38 PM Louie Casa B wrote: Reason for CRM: patient is calling in because she wants to know if she go to the hospital she said she wants the dr patel to call her directly please call patient [phone]

## 2023-10-29 ENCOUNTER — Telehealth: Payer: Self-pay | Admitting: Cardiology

## 2023-10-29 NOTE — Telephone Encounter (Signed)
Pt states that she was feeling lightlessness on Thursday morning and was walking in her home and she states that she awoke on the floor and that she voided on herself. She reports that since Sat her hr has been elevated. She states that he Hr normally runs around 92. And on Sat it was 139. She was unable to walk d/t being SOB and dizzy. On yesterday she took Meclizine on yesterday and last night. Current BP is 127/ 82, Hr 98. Pt called PCP office and was told to call our office and request an appt and monitor. Please advise.

## 2023-10-29 NOTE — Telephone Encounter (Signed)
STAT if HR is under 50 or over 120 (normal HR is 60-100 beats per minute)  What is your heart rate? 132 highest it been 92 is the lowest   Do you have a log of your heart rate readings (document readings)?    Do you have any other symptoms? Dizziness, legs feels heavy, weakness, sob, pain in stomach

## 2023-10-29 NOTE — Telephone Encounter (Signed)
Called pt to notify of Dr. Antoine Poche recombination to be seen in the ER for eval. Pt states that she lives alone and does not have family in the states. She states that she will call a neighbor when they get home today. Suggested that pt call EMS. Was told that she does not want to come to [facility] and would rather be seen in [place].

## 2023-11-03 ENCOUNTER — Other Ambulatory Visit (HOSPITAL_COMMUNITY)
Admission: RE | Admit: 2023-11-03 | Discharge: 2023-11-03 | Disposition: A | Discharge status: Home / Self Care | Payer: Medicare Other | Source: Ambulatory Visit | Attending: "Endocrinology | Admitting: "Endocrinology

## 2023-11-03 ENCOUNTER — Ambulatory Visit (INDEPENDENT_AMBULATORY_CARE_PROVIDER_SITE_OTHER): Payer: Medicare Other | Admitting: Neurology

## 2023-11-03 ENCOUNTER — Encounter: Payer: Self-pay | Admitting: Neurology

## 2023-11-03 VITALS — BP 126/82 | HR 88 | Ht 67.0 in | Wt 204.0 lb

## 2023-11-03 DIAGNOSIS — E782 Mixed hyperlipidemia: Secondary | ICD-10-CM | POA: Insufficient documentation

## 2023-11-03 DIAGNOSIS — G43009 Migraine without aura, not intractable, without status migrainosus: Secondary | ICD-10-CM

## 2023-11-03 DIAGNOSIS — E039 Hypothyroidism, unspecified: Secondary | ICD-10-CM | POA: Diagnosis not present

## 2023-11-03 LAB — LIPID PANEL
Cholesterol: 176 mg/dL (ref 0–200)
HDL: 22 mg/dL — ABNORMAL LOW (ref >40)
LDL Cholesterol: 119 mg/dL — ABNORMAL HIGH (ref 0–99)
Total CHOL/HDL Ratio: 8 {ratio}
Triglycerides: 173 mg/dL — ABNORMAL HIGH (ref <150)
VLDL: 35 mg/dL (ref 0–40)

## 2023-11-03 LAB — COMPREHENSIVE METABOLIC PANEL
ALT: 17 U/L (ref 0–44)
AST: 29 U/L (ref 15–41)
Albumin: 3.8 g/dL (ref 3.5–5.0)
Alkaline Phosphatase: 29 U/L — ABNORMAL LOW (ref 38–126)
Anion gap: 12 (ref 5–15)
BUN: 15 mg/dL (ref 8–23)
CO2: 21 mmol/L — ABNORMAL LOW (ref 22–32)
Calcium: 9.7 mg/dL (ref 8.9–10.3)
Chloride: 103 mmol/L (ref 98–111)
Creatinine, Ser: 1.31 mg/dL — ABNORMAL HIGH (ref 0.44–1.00)
GFR, Estimated: 44 mL/min — ABNORMAL LOW (ref >60)
Glucose, Bld: 120 mg/dL — ABNORMAL HIGH (ref 70–99)
Potassium: 3.7 mmol/L (ref 3.5–5.1)
Sodium: 136 mmol/L (ref 135–145)
Total Bilirubin: 1.8 mg/dL — ABNORMAL HIGH (ref 0.0–1.2)
Total Protein: 7.1 g/dL (ref 6.5–8.1)

## 2023-11-03 LAB — MAGNESIUM: Magnesium: 1.5 mg/dL — ABNORMAL LOW (ref 1.7–2.4)

## 2023-11-03 LAB — T4, FREE: Free T4: 1.2 ng/dL — ABNORMAL HIGH (ref 0.61–1.12)

## 2023-11-03 LAB — TSH: TSH: 1.393 u[IU]/mL (ref 0.350–4.500)

## 2023-11-03 NOTE — Progress Notes (Signed)
[facility]  PATIENT: [name] DOB: 02-07-1951  REQUESTING CLINICIAN: Anabel Halon, MD HISTORY FROM: Patient  REASON FOR VISIT: Migraines   HISTORICAL  CHIEF COMPLAINT:  Chief Complaint  Patient presents with   Follow-up    Pt in 13, here alone  Pt is here for migraine follow up. Pt states she is having 4 migraines a month.    INTERVAL HISTORY 11/03/2023:  Patient presents today for follow up. Last visit was 6 months ago.  Since then she has been doing well in terms of the headaches.  She tells me since July of last year until now she has taken the Maxalt only 4 times, last one was this morning.   For the past 6 weeks has been having a cough, a week and a half ago woke up with fever, weakness, dyspnea on exertion, could not walk more than 50 feet.  She could not do anything.  She took couple of COVID test which were negative.  Last Thursday, she did have a fall in the middle of the night, she woke up to turn her humidifier on and next thing she is waking up on the floor with urinary incontinence.  Denies any injury, no tongue biting.  She is seeing her cardiologist tomorrow.    INTERVAL HISTORY 04/15/2023:  Patient presents today for follow-up, she is alone.  Last visit was in January and since then she continues to have headaches.  She reports on average 2 headaches per month for which Maxalt helps a lot.  She is not on any preventive medication.  She reports decreased vision, she is following with ophthalmology.  She has glaucoma.  She does report sometimes she is straining and believes this is triggering her headaches.  No other complaint no other concerns.   INTERVAL HISTORY 10/14/22:  Patient presents today for follow-up, last visit was in September.  At that time we have started her on Maxalt for abortive medication and Nurtec for preventive medication.  Due to previous report of patient being allergic to Nurtec, the Nurtec was not renewed.  She  reports since September she had a total of 5 migraines in which the Maxalt has helped.  She reports Maxalt controlled her headaches.  She does not need the Nurtec as preventive medication anymore. She has not had any headaches for the past 2 months    INTERVAL HISTORY 06/24/22 Patient presents today for follow-up, last visit was in March.  Since then, she reports that her back pain has improved after getting back injections.  When it comes to the headaches, she denies any change in frequency.  At last visit I have started her on Nurtec every other day but patient is not sure if she is taking it.  She stated she takes a lot of medication and she is unable to tell me if she take Nurtec or not.  Currently when she is has a headache she does not take any medication.  Her MRI brain was completed, did not show any acute intracranial abnormality.   HISTORY OF PRESENT ILLNESS:  This is a 73 year old woman with multiple medical conditions including chronic pain, hypertension, hyperlipidemia, glaucoma, hypothyroidism, migraine headaches who is presenting with complaint of right-sided weakness.  Patient reports history of right arm and right leg weakness. She presented to her orthopedist Dr. Hilda Lias and due to the complaint of right-sided arm and leg weakness, she was referred to Neurology.  At that same time she was also given a  right shoulder ESI and had MRI of her shoulder.  MRI shoulder showed evidence of tears and arthritis and patient also reported that after the injection she felt much better.  At that time she did not have any neck pain.  Currently she reported the shoulder pain resolved, she is not experiencing neck pain, she is able to lift her right arm above her shoulder and this is a marked improvement.  For her right leg weakness pain, she attributed to chronic low back pain, she states that both legs sometimes do get weak. She denies any fall recently.    Patient also complained of migraine.  She  reported a history of migraine, in the past she has tried Imitrex as abortive medication, as tired over-the-counter medications but lately her migraines have reappear.  Currently she is having 4 headaches days per week.  She is not on any abortive or preventive medication.  When she has the headaches, she reports putting ice or wrap her head with something cold and going to sleep.   She does also note that sometimes she get confused, sometimes has word finding difficulty but she is able to hold conversation, she is still independent in all activities of daily living and all IADLs  She reports that she lives alone, she does not have any family here, her son is in [place]   OTHER MEDICAL CONDITIONS: Chronic pain, hypertension, glaucoma, hyperlipidemia, hypothyroidism    REVIEW OF SYSTEMS: Full 14 system review of systems performed and negative with exception of: as noted in the HPI   ALLERGIES: Allergies  Allergen Reactions   Nutmeg Oil (Myristica Oil) Anaphylaxis    Nut oil- skin irritation. Nut oil- skin irritation.   Adhesive [Tape] Other (See Comments)    Blisters skin   Aspirin Other (See Comments)    sickle cell trait--  Not recommended unless emergency   Imitrex [Sumatriptan] Hives   Keflex [Cephalexin] Other (See Comments)    G6PD   Levaquin [Levofloxacin In D5w]     Altered mental status Affects G6PD   Other     Nut Oil- skin irritation    Statins Other (See Comments)    Myopathy - elevated CK Myopathy - elevated CK Myopathy-elevated CK   Sulfa Antibiotics Other (See Comments)    Patient has sickle cell trait Sickle cell trait.   Sulfonamide Derivatives Other (See Comments)    Patient has sickle cell trait   Latex Rash    HOME MEDICATIONS: Outpatient Medications Prior to Visit  Medication Sig Dispense Refill   albuterol (PROVENTIL) (2.5 MG/3ML) 0.083% nebulizer solution Take 3 mLs (2.5 mg total) by nebulization every 4 (four) hours as needed for wheezing or  shortness of breath. 150 mL 3   albuterol (VENTOLIN HFA) 108 (90 Base) MCG/ACT inhaler Inhale 2 puffs into the lungs every 4 (four) hours as needed for wheezing. 54 g 3   ALPRAZolam (XANAX) 1 MG tablet Take 1 tablet (1 mg total) by mouth 2 (two) times daily as needed for anxiety. (Patient taking differently: Take 0.5-1 mg by mouth at bedtime.) 180 tablet 3   brimonidine (ALPHAGAN) 0.2 % ophthalmic solution Place 1 drop into both eyes 2 (two) times daily. 15 mL 3   chlorpheniramine-HYDROcodone (TUSSIONEX) 10-8 MG/5ML Take 5 mLs by mouth every 12 (twelve) hours as needed for cough. 115 mL 0   Cholecalciferol (VITAMIN D3) 50 MCG (2000 UT) TABS Take 2,000 Units by mouth daily.     dexlansoprazole (DEXILANT) 60 MG capsule Take 1  capsule (60 mg total) by mouth daily. 90 capsule 3   diclofenac Sodium (VOLTAREN) 1 % GEL Apply 4 g topically 4 (four) times daily. 100 g 2   estradiol (ESTRACE VAGINAL) 0.1 MG/GM vaginal cream Place 1 Applicatorful vaginally daily as needed. 42.5 g 3   fenofibrate (TRICOR) 145 MG tablet Take 1 tablet (145 mg total) by mouth daily. 90 tablet 3   Fluticasone-Umeclidin-Vilant (TRELEGY ELLIPTA) 100-62.5-25 MCG/ACT AEPB Inhale 1 puff into the lungs daily. 3 each 4   HYDROcodone-acetaminophen (NORCO/VICODIN) 5-325 MG tablet Take 1 tablet by mouth every 8 (eight) hours as needed for moderate pain (pain score 4-6). 30 tablet 0   hyoscyamine (ANASPAZ) 0.125 MG TBDP disintergrating tablet Place 1 tablet (0.125 mg total) under the tongue daily as needed (IBS). Place 0.125 mg under the tongue daily as needed (IBS). 30 tablet 0   irbesartan (AVAPRO) 150 MG tablet Take 0.5 tablets (75 mg total) by mouth daily. 45 tablet 1   latanoprost (XALATAN) 0.005 % ophthalmic solution Place 1 drop into both eyes at bedtime. 9 mL 3   levothyroxine (SYNTHROID) 25 MCG tablet Take 1 tablet (25 mcg total) by mouth daily. 90 tablet 1   Lidocaine-Hydrocortisone Ace 3-2.5 % KIT APPLY TO RECTUM QID for 2 weeks  then AS NEEDED FOR RECTAL PAIN OR BLEEDING 1 kit 11   Lifitegrast (XIIDRA) 5 % SOLN INSTILL 1 DROP IN BOTH EYES EVERY DAY AS NEEDED (EACH CONTAINER SHOULD YIELD 2 DROPS) STORE IN ORIGINAL FOIL POUCH (Patient taking differently: Place 1 drop into both eyes daily. (EACH CONTAINER SHOULD YIELD 2 DROPS) STORE IN ORIGINAL FOIL POUCH) 60 each 3   Magnesium 500 MG TABS Take 1 tablet by mouth 2 (two) times daily with a meal.     meclizine (ANTIVERT) 12.5 MG tablet Take 1 tablet (12.5 mg total) by mouth 3 (three) times daily as needed for dizziness. 30 tablet 3   Menthol, Topical Analgesic, (BIOFREEZE EX) Apply 1 application topically daily as needed (pain).     metroNIDAZOLE (METROCREAM) 0.75 % cream Apply 1 application. topically as needed. 45 g 3   naproxen (NAPROSYN) 500 MG tablet Take 1 tablet (500 mg total) by mouth 2 (two) times daily with a meal. 60 tablet 5   neomycin-polymyxin b-dexamethasone (MAXITROL) 3.5-10000-0.1 OINT Place 1 application into both eyes at bedtime as needed. (Patient taking differently: Place 1 application  into both eyes at bedtime as needed (Dry eyes).) 10.5 g 3   nitroGLYCERIN (NITROLINGUAL) 0.4 MG/SPRAY spray Place 1 spray under the tongue every 5 (five) minutes as needed for chest pain. 12 g 3   NON FORMULARY Take 1 tablet by mouth daily as needed (Stomach Bloat). FD GUARD     ondansetron (ZOFRAN) 4 MG tablet TAKE ONE TABLET BY MOUTH EVERY 8 HOURS AS NEEDED FOR NAUSEA OR VOMITING 30 tablet 1   prednisoLONE acetate (PRED FORTE) 1 % ophthalmic suspension Place 1 drop into the right eye 3 (three) times daily.     Probiotic Product (PROBIOTIC DAILY PO) Take 1 capsule by mouth daily. VSL #3     Rimegepant Sulfate (NURTEC) 75 MG TBDP Take 1 tablet by mouth as needed.     rizatriptan (MAXALT-MLT) 10 MG disintegrating tablet Take 1 tablet (10 mg total) by mouth as needed for migraine. May repeat in 2 hours if needed. (ok to send out this medication to the patient, the patient can  tolerate this medication and will be monitored, if this is not acceptable please cancel  this script and send in a new erx with a note, thanks) 9 tablet 6   UNABLE TO FIND Place 1 application rectally daily as needed. [facility]  Med Name: lidocaine 5% pramoxine 1% ointment per rectum as needed.     zolpidem (AMBIEN) 10 MG tablet Take 1 tablet (10 mg total) by mouth at bedtime as needed for sleep. 90 tablet 1   DUREZOL 0.05 % EMUL Place 1 drop into the right eye daily. (Patient not taking: Reported on 11/03/2023)     ezetimibe (ZETIA) 10 MG tablet Take 1 tablet (10 mg total) by mouth daily. 90 tablet 3   No facility-administered medications prior to visit.    PAST MEDICAL HISTORY: Past Medical History:  Diagnosis Date   Anal fissure    Anxiety    Arthritis    Asthma    C. difficile diarrhea 09/17/2021   Cataract    Phreesia 01/05/2021   Chest pain    a. 02/2000 Cath: nl cors, EF 60%;  b. 10/2010 MV: nl LV, no ischemia/infarct;  c. 03/2014 Admit c/p, r/o->grief from husbands death.   Chronic RUQ pain November 28, 2007   EUS slightly dilated CBD (7.67mm), otherwise nl   Depression    Diverticulosis    Eczema    Exposure to chemical inhalation mid 1970s   Fatty liver    G6PD deficiency    Gallstones    GERD (gastroesophageal reflux disease)    Glaucoma    History of pulmonary embolus (PE)    Treated with Eliquis 2014-11-27   HTN (hypertension)    Hypercholesteremia    a. intolerant to statins.   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 She has limited vision in the right eye.   Migraines    inner ocular   Ocular migraine    Palpitations    Pleurisy    Pneumonia 07/16/2021   Patient was exposed to a harsh chemical in the 1970's that created scar tissue in her lungs. She has had pneumonia several times in the past.   PONV (postoperative nausea and vomiting)    Pulmonary emboli (HCC)    Recurrent  upper respiratory infection (URI)    Renal cyst    Retinal cyst    Sickle cell trait (HCC)    Tubular adenoma of colon 09/17/2011   Urticaria     PAST SURGICAL HISTORY: Past Surgical History:  Procedure Laterality Date   ABDOMINAL HYSTERECTOMY     ADENOIDECTOMY     ANGIOPLASTY     APPENDECTOMY     BIOPSY N/A 03/14/2013   Procedure: SMALL BOWEL AND GASTRIC BIOPSIES (Procedure #1);  Surgeon: West Bali, MD;  Location: [facility];  Service: Endoscopy;  Laterality: N/A;   CARDIAC CATHETERIZATION Left 02/11/2000   CATARACT EXTRACTION Left    CATARACT EXTRACTION W/PHACO Right 03/19/2021   Procedure: CATARACT EXTRACTION PHACO AND INTRAOCULAR LENS PLACEMENT (IOC) RIGHT 6.75 00:50.4;  Surgeon: Lockie Mola, MD;  Location: [facility];  Service: Ophthalmology;  Laterality: Right;   CHOLECYSTECTOMY  12/2007   COLONOSCOPY  12/22/2010   NUU:VOZDGU, cecal adenomatous polyp   COLONOSCOPY WITH PROPOFOL N/A 03/31/2016   Dr. Darrick Penna: four sessile polyps rectum/sigmoid colon, diverticulosi, ext/int hemorrhoids. hyperplastic polyps, next tcs 5 years.   COLONOSCOPY WITH PROPOFOL N/A 04/21/2021   anal fissure, non-bleeding internal hemorrhoids, right colon diverticulosis, one 2 mm polyp in ascending,  three 4-6 mm polyps in descending colon. Tubular adenomas. 5 year surveillance.   complete hysterectomy     ENTEROSCOPY N/A 03/14/2013   ZOX:WRUE gastritis/ulcers has healed   ESOPHAGOGASTRODUODENOSCOPY  09/22/2011   AVW:UJWJ gastritis/Duodenitis   EXCISIONAL HEMORRHOIDECTOMY     FLEXIBLE SIGMOIDOSCOPY N/A 03/14/2013   SLF:3 colon polyp removed/moderate sized internal hemorrhoids   FLEXIBLE SIGMOIDOSCOPY N/A 06/09/2016   Procedure: FLEXIBLE SIGMOIDOSCOPY;  Surgeon: West Bali, MD;  Location: [facility];  Service: Endoscopy;  Laterality: N/A;  rectal polyps times 2   FLEXIBLE SIGMOIDOSCOPY N/A 03/18/2018   Procedure: FLEXIBLE SIGMOIDOSCOPY;  Surgeon: West Bali, MD;  Location:  [facility];  Service: Endoscopy;  Laterality: N/A;  9:15am   FOOT SURGERY     bunion removal left   GIVENS CAPSULE STUDY N/A 02/10/2013   Procedure: GIVENS CAPSULE STUDY;  Surgeon: West Bali, MD;  Location: [facility];  Service: Endoscopy;  Laterality: N/A;  730   HEEL SPUR EXCISION     right    HEMORRHOID BANDING N/A 03/14/2013   Procedure: HEMORRHOID BANDING (Procedure #3)  3 bands applied [id number] Exp 02/02/2014 ;  Surgeon: West Bali, MD;  Location: [facility];  Service: Endoscopy;  Laterality: N/A;   HEMORRHOID SURGERY N/A 04/24/2016   Procedure: EXTENSIVE HEMORRHOIDECTOMY;  Surgeon: Ancil Linsey, MD;  Location: [facility];  Service: General;  Laterality: N/A;   LAPAROSCOPY     adhesions   POLYPECTOMY N/A 03/14/2013   ZHY:QMVH Gastritis . ULCERS SEEN ON MAY 6 HAVE HEALED   POLYPECTOMY  03/31/2016   Procedure: POLYPECTOMY;  Surgeon: West Bali, MD;  Location: [facility];  Service: Endoscopy;;  sigmoid colon polyps x2, rectal polyps x2   POLYPECTOMY  04/21/2021   Procedure: POLYPECTOMY;  Surgeon: Lanelle Bal, DO;  Location: [facility];  Service: Endoscopy;;   RECTAL EXAM UNDER ANESTHESIA N/A 07/23/2021   Procedure: RECTAL EXAM UNDER ANESTHESIA;  Surgeon: Romie Levee, MD;  Location: [facility];  Service: General;  Laterality: N/A;   SPHINCTEROTOMY N/A 07/23/2021   Procedure: CHEMICAL SPHINCTEROTOMY;  Surgeon: Romie Levee, MD;  Location: [facility];  Service: General;  Laterality: N/A;   TONSILLECTOMY     TUBAL LIGATION      FAMILY HISTORY: Family History  Problem Relation Age of Onset   Colon cancer Mother        82s   Urticaria Mother    Heart disease Mother    Colon polyps Mother    Cancer Father        oral   Crohn's disease Sister    Cancer Sister        around heart   Colon polyps Sister    Diabetes Brother    Other Brother        intestinal sugery   Colon polyps Brother    Colon cancer Maternal  Grandmother    Colon cancer Maternal Grandfather    Colon cancer Paternal Grandfather    Anesthesia problems Neg Hx     SOCIAL HISTORY: Social History   Socioeconomic History   Marital status: Widowed    Spouse name: Not on file   Number of children: 1   Years of education: Not on file   Highest education level: Not on file  Occupational History   Occupation: homemaker    Employer: UNEMPLOYED  Tobacco Use   Smoking status: Former    Current packs/day: 0.00    Average packs/day:  0.5 packs/day for 30.0 years (15.0 ttl pk-yrs)    Types: Cigarettes    Start date: 10/15/1966    Quit date: 10/15/1996    Years since quitting: 27.0   Smokeless tobacco: Never   Tobacco comments:    Stopped smoking ~ 2000  Vaping Use   Vaping status: Never Used  Substance and Sexual Activity   Alcohol use: Not Currently    Alcohol/week: 4.0 standard drinks of alcohol    Types: 4 Glasses of wine per week    Comment: 1 drinks every couple of weeks   Drug use: No   Sexual activity: Not Currently    Birth control/protection: Surgical  Other Topics Concern   Not on file  Social History Narrative   Does not routinely exercise. Husband passed in JUN 2015 due to prostate ca.   Social Drivers of Corporate investment banker Strain: Low Risk  (06/11/2023)   Overall Financial Resource Strain (CARDIA)    Difficulty of Paying Living Expenses: Not hard at all  Food Insecurity: No Food Insecurity (06/11/2023)   Hunger Vital Sign    Worried About Running Out of Food in the Last Year: Never true    Ran Out of Food in the Last Year: Never true  Transportation Needs: No Transportation Needs (06/11/2023)   PRAPARE - Administrator, Civil Service (Medical): No    Lack of Transportation (Non-Medical): No  Physical Activity: Sufficiently Active (06/11/2023)   Exercise Vital Sign    Days of Exercise per Week: 6 days    Minutes of Exercise per Session: 30 min  Stress: No Stress Concern Present (06/11/2023)    [employer] of Occupational Health - Occupational Stress Questionnaire    Feeling of Stress : Not at all  Social Connections: Socially Isolated (06/11/2023)   Social Connection and Isolation Panel [NHANES]    Frequency of Communication with Friends and Family: Three times a week    Frequency of Social Gatherings with Friends and Family: Once a week    Attends Religious Services: Never    Database administrator or Organizations: No    Attends Banker Meetings: Never    Marital Status: Widowed  Intimate Partner Violence: Not At Risk (06/11/2023)   Humiliation, Afraid, Rape, and Kick questionnaire    Fear of Current or Ex-Partner: No    Emotionally Abused: No    Physically Abused: No    Sexually Abused: No    PHYSICAL EXAM    GENERAL EXAM/CONSTITUTIONAL: Vitals:  Vitals:   11/03/23 1416  BP: 126/82  Pulse: 88  Weight: 204 lb (92.5 kg)  Height: 5\' 7"  (1.702 m)    Body mass index is 31.95 kg/m. Wt Readings from Last 3 Encounters:  11/03/23 204 lb (92.5 kg)  10/04/23 210 lb 9.6 oz (95.5 kg)  09/16/23 271 lb (122.9 kg)   Patient is in no distress; well developed, nourished and groomed; neck is supple  EYES: Loss of peripheral vision bilaterally ,reports vision loss with the left. Extraocular movements intacts   MUSCULOSKELETAL: Gait, strength, tone, movements noted in Neurologic exam below  NEUROLOGIC: MENTAL STATUS:     05/18/2022    3:33 PM  MMSE - Mini Mental State Exam  Not completed: Unable to complete   awake, alert, oriented to person, place and time recent and remote memory intact normal attention and concentration language fluent, comprehension intact, naming intact fund of knowledge appropriate  CRANIAL NERVE:  2nd, 3rd, 4th, 6th - Loss  of peripheral vision on the right., no vision on the left. extraocular muscles intact, no nystagmus 5th - facial sensation symmetric 7th - facial strength symmetric 8th - hearing intact 9th - palate  elevates symmetrically, uvula midline 11th - shoulder shrug symmetric 12th - tongue protrusion midline  MOTOR:  normal bulk and tone, full strength in the BUE, BLE. RUE shoulder abduction is limited by pain.   SENSORY:  normal and symmetric to light touch, vibration  COORDINATION:  finger-nose-finger, fine finger movements normal  GAIT/STATION:  Normal, negative romberg      DIAGNOSTIC DATA (LABS, IMAGING, TESTING) - I reviewed patient records, labs, notes, testing and imaging myself where available.  Lab Results  Component Value Date   WBC 8.4 09/14/2022   HGB 13.2 09/14/2022   HCT 39.5 09/14/2022   MCV 96.1 09/14/2022   PLT 195 09/14/2022      Component Value Date/Time   NA 136 11/03/2023 1246   NA 137 02/01/2023 1411   K 3.7 11/03/2023 1246   K 4.3 07/12/2012 0755   CL 103 11/03/2023 1246   CL 100 07/12/2012 0755   CO2 21 (L) 11/03/2023 1246   GLUCOSE 120 (H) 11/03/2023 1246   BUN 15 11/03/2023 1246   BUN 22 02/01/2023 1411   CREATININE 1.31 (H) 11/03/2023 1246   CREATININE 1.23 (H) 12/11/2020 1023   CALCIUM 9.7 11/03/2023 1246   CALCIUM 11.0 07/12/2012 0755   PROT 7.1 11/03/2023 1246   PROT 6.7 02/01/2023 1411   PROT 8.1 07/12/2012 0755   ALBUMIN 3.8 11/03/2023 1246   ALBUMIN 4.5 02/01/2023 1411   AST 29 11/03/2023 1246   AST 42 07/12/2012 0755   ALT 17 11/03/2023 1246   ALKPHOS 29 (L) 11/03/2023 1246   ALKPHOS 32 07/12/2012 0755   BILITOT 1.8 (H) 11/03/2023 1246   BILITOT 0.6 02/01/2023 1411   BILITOT 1.0 07/12/2012 0755   GFRNONAA 44 (L) 11/03/2023 1246   GFRNONAA 36 (L) 07/22/2020 1441   GFRAA 41 (L) 07/22/2020 1441   Lab Results  Component Value Date   CHOL 176 11/03/2023   HDL 22 (L) 11/03/2023   LDLCALC 119 (H) 11/03/2023   TRIG 173 (H) 11/03/2023   CHOLHDL 8.0 11/03/2023   Lab Results  Component Value Date   HGBA1C 5.6 02/01/2023   Lab Results  Component Value Date   VITAMINB12 681 11/24/2016   Lab Results  Component Value  Date   TSH 1.393 11/03/2023    MRI Brain 01/05/22 1. 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 2. 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  ASSESSMENT AND PLAN  73 y.o. year old female who is presenting for headache follow-up.  Her headaches are well-controlled with Maxalt, took Maxalt 4 times during the past 6 months. Plan for now is to continue patient on Maxalt.  Follow-up in 6 months as scheduled or sooner if worse.   1. Migraine without aura and without status migrainosus, not intractable     Patient Instructions  Continue current medications  Continue to follow up with your doctors  Return as scheduled in 6 months or sooner if worse   No orders of the defined types were placed in this encounter.   No orders of the defined types were placed in this encounter.   No follow-ups on file.    Windell Norfolk, MD 11/03/2023, 3:08 PM  [facility] [address], [address] [place], [place] [address] [phone]

## 2023-11-03 NOTE — Patient Instructions (Addendum)
Continue current medications  Continue to follow up with your doctors  Return as scheduled in 6 months or sooner if worse

## 2023-11-04 ENCOUNTER — Encounter (HOSPITAL_COMMUNITY): Payer: Self-pay

## 2023-11-04 ENCOUNTER — Other Ambulatory Visit (HOSPITAL_COMMUNITY): Payer: Self-pay

## 2023-11-04 ENCOUNTER — Telehealth (HOSPITAL_COMMUNITY): Payer: Self-pay | Admitting: Pharmacy Technician

## 2023-11-04 ENCOUNTER — Emergency Department (HOSPITAL_COMMUNITY): Payer: Medicare Other

## 2023-11-04 ENCOUNTER — Observation Stay (HOSPITAL_COMMUNITY)
Admission: EM | Admit: 2023-11-04 | Discharge: 2023-11-05 | Disposition: A | Discharge status: Home / Self Care | Payer: Medicare Other | Attending: Family Medicine | Admitting: Family Medicine

## 2023-11-04 ENCOUNTER — Encounter: Payer: Self-pay | Admitting: Nurse Practitioner

## 2023-11-04 ENCOUNTER — Other Ambulatory Visit: Payer: Self-pay

## 2023-11-04 ENCOUNTER — Other Ambulatory Visit (HOSPITAL_COMMUNITY): Payer: Self-pay | Admitting: *Deleted

## 2023-11-04 ENCOUNTER — Inpatient Hospital Stay (HOSPITAL_COMMUNITY): Payer: Medicare Other

## 2023-11-04 ENCOUNTER — Ambulatory Visit: Payer: Medicare Other | Admitting: Nurse Practitioner

## 2023-11-04 VITALS — BP 135/83 | HR 89 | Ht 67.0 in | Wt 203.2 lb

## 2023-11-04 DIAGNOSIS — M48061 Spinal stenosis, lumbar region without neurogenic claudication: Secondary | ICD-10-CM | POA: Diagnosis not present

## 2023-11-04 DIAGNOSIS — I10 Essential (primary) hypertension: Secondary | ICD-10-CM | POA: Diagnosis not present

## 2023-11-04 DIAGNOSIS — J45909 Unspecified asthma, uncomplicated: Secondary | ICD-10-CM | POA: Diagnosis not present

## 2023-11-04 DIAGNOSIS — M47897 Other spondylosis, lumbosacral region: Secondary | ICD-10-CM | POA: Diagnosis not present

## 2023-11-04 DIAGNOSIS — F32A Depression, unspecified: Secondary | ICD-10-CM | POA: Diagnosis not present

## 2023-11-04 DIAGNOSIS — R069 Unspecified abnormalities of breathing: Secondary | ICD-10-CM | POA: Diagnosis not present

## 2023-11-04 DIAGNOSIS — K7581 Nonalcoholic steatohepatitis (NASH): Secondary | ICD-10-CM | POA: Diagnosis not present

## 2023-11-04 DIAGNOSIS — K219 Gastro-esophageal reflux disease without esophagitis: Secondary | ICD-10-CM | POA: Diagnosis present

## 2023-11-04 DIAGNOSIS — I82452 Acute embolism and thrombosis of left peroneal vein: Secondary | ICD-10-CM | POA: Diagnosis not present

## 2023-11-04 DIAGNOSIS — I2609 Other pulmonary embolism with acute cor pulmonale: Secondary | ICD-10-CM | POA: Diagnosis not present

## 2023-11-04 DIAGNOSIS — Z1589 Genetic susceptibility to other disease: Secondary | ICD-10-CM

## 2023-11-04 DIAGNOSIS — R0602 Shortness of breath: Secondary | ICD-10-CM

## 2023-11-04 DIAGNOSIS — F419 Anxiety disorder, unspecified: Secondary | ICD-10-CM | POA: Diagnosis present

## 2023-11-04 DIAGNOSIS — I3139 Other pericardial effusion (noninflammatory): Secondary | ICD-10-CM | POA: Diagnosis not present

## 2023-11-04 DIAGNOSIS — Z79899 Other long term (current) drug therapy: Secondary | ICD-10-CM | POA: Insufficient documentation

## 2023-11-04 DIAGNOSIS — E785 Hyperlipidemia, unspecified: Secondary | ICD-10-CM | POA: Diagnosis present

## 2023-11-04 DIAGNOSIS — R55 Syncope and collapse: Secondary | ICD-10-CM

## 2023-11-04 DIAGNOSIS — H401130 Primary open-angle glaucoma, bilateral, stage unspecified: Secondary | ICD-10-CM | POA: Diagnosis present

## 2023-11-04 DIAGNOSIS — F5104 Psychophysiologic insomnia: Secondary | ICD-10-CM | POA: Diagnosis present

## 2023-11-04 DIAGNOSIS — K589 Irritable bowel syndrome without diarrhea: Secondary | ICD-10-CM | POA: Diagnosis present

## 2023-11-04 DIAGNOSIS — G4700 Insomnia, unspecified: Secondary | ICD-10-CM | POA: Diagnosis not present

## 2023-11-04 DIAGNOSIS — Z91040 Latex allergy status: Secondary | ICD-10-CM | POA: Insufficient documentation

## 2023-11-04 DIAGNOSIS — R000 Tachycardia, unspecified: Secondary | ICD-10-CM

## 2023-11-04 DIAGNOSIS — E039 Hypothyroidism, unspecified: Secondary | ICD-10-CM | POA: Diagnosis not present

## 2023-11-04 DIAGNOSIS — I2699 Other pulmonary embolism without acute cor pulmonale: Principal | ICD-10-CM | POA: Diagnosis present

## 2023-11-04 DIAGNOSIS — Z1152 Encounter for screening for COVID-19: Secondary | ICD-10-CM | POA: Insufficient documentation

## 2023-11-04 DIAGNOSIS — R0603 Acute respiratory distress: Secondary | ICD-10-CM | POA: Diagnosis not present

## 2023-11-04 DIAGNOSIS — Z87898 Personal history of other specified conditions: Secondary | ICD-10-CM

## 2023-11-04 DIAGNOSIS — D550 Anemia due to glucose-6-phosphate dehydrogenase [G6PD] deficiency: Secondary | ICD-10-CM | POA: Diagnosis not present

## 2023-11-04 DIAGNOSIS — Z87891 Personal history of nicotine dependence: Secondary | ICD-10-CM | POA: Diagnosis not present

## 2023-11-04 DIAGNOSIS — R1010 Upper abdominal pain, unspecified: Secondary | ICD-10-CM

## 2023-11-04 DIAGNOSIS — R059 Cough, unspecified: Secondary | ICD-10-CM | POA: Diagnosis not present

## 2023-11-04 DIAGNOSIS — Z6831 Body mass index (BMI) 31.0-31.9, adult: Secondary | ICD-10-CM | POA: Diagnosis not present

## 2023-11-04 DIAGNOSIS — F4320 Adjustment disorder, unspecified: Secondary | ICD-10-CM | POA: Diagnosis present

## 2023-11-04 DIAGNOSIS — M7989 Other specified soft tissue disorders: Secondary | ICD-10-CM | POA: Diagnosis not present

## 2023-11-04 DIAGNOSIS — M51379 Other intervertebral disc degeneration, lumbosacral region without mention of lumbar back pain or lower extremity pain: Secondary | ICD-10-CM | POA: Diagnosis present

## 2023-11-04 DIAGNOSIS — R0600 Dyspnea, unspecified: Secondary | ICD-10-CM | POA: Diagnosis not present

## 2023-11-04 LAB — CBC WITH DIFFERENTIAL/PLATELET
Abs Immature Granulocytes: 0.02 10*3/uL (ref 0.00–0.07)
Basophils Absolute: 0.1 10*3/uL (ref 0.0–0.1)
Basophils Relative: 1 %
Eosinophils Absolute: 0.4 10*3/uL (ref 0.0–0.5)
Eosinophils Relative: 6 %
HCT: 37.6 % (ref 36.0–46.0)
Hemoglobin: 12.3 g/dL (ref 12.0–15.0)
Immature Granulocytes: 0 %
Lymphocytes Relative: 25 %
Lymphs Abs: 1.6 10*3/uL (ref 0.7–4.0)
MCH: 32.4 pg (ref 26.0–34.0)
MCHC: 32.7 g/dL (ref 30.0–36.0)
MCV: 98.9 fL (ref 80.0–100.0)
Monocytes Absolute: 0.5 10*3/uL (ref 0.1–1.0)
Monocytes Relative: 8 %
Neutro Abs: 4 10*3/uL (ref 1.7–7.7)
Neutrophils Relative %: 60 %
Platelets: 216 10*3/uL (ref 150–400)
RBC: 3.8 MIL/uL — ABNORMAL LOW (ref 3.87–5.11)
RDW: 13.3 % (ref 11.5–15.5)
WBC: 6.5 10*3/uL (ref 4.0–10.5)
nRBC: 0 % (ref 0.0–0.2)

## 2023-11-04 LAB — BASIC METABOLIC PANEL
Anion gap: 9 (ref 5–15)
BUN: 17 mg/dL (ref 8–23)
CO2: 19 mmol/L — ABNORMAL LOW (ref 22–32)
Calcium: 8.9 mg/dL (ref 8.9–10.3)
Chloride: 106 mmol/L (ref 98–111)
Creatinine, Ser: 1.16 mg/dL — ABNORMAL HIGH (ref 0.44–1.00)
GFR, Estimated: 50 mL/min — ABNORMAL LOW (ref >60)
Glucose, Bld: 110 mg/dL — ABNORMAL HIGH (ref 70–99)
Potassium: 3.9 mmol/L (ref 3.5–5.1)
Sodium: 134 mmol/L — ABNORMAL LOW (ref 135–145)

## 2023-11-04 LAB — HEPARIN LEVEL (UNFRACTIONATED): Heparin Unfractionated: 0.8 [IU]/mL — ABNORMAL HIGH (ref 0.30–0.70)

## 2023-11-04 LAB — ECHOCARDIOGRAM COMPLETE
AR max vel: 2.23 cm2
AV Area VTI: 2.21 cm2
AV Area mean vel: 2 cm2
AV Mean grad: 3.9 mm[Hg]
AV Peak grad: 8.3 mm[Hg]
Ao pk vel: 1.44 m/s
Area-P 1/2: 2.76 cm2
Height: 67 in
S' Lateral: 2.5 cm
Weight: 3251.2 [oz_av]

## 2023-11-04 LAB — MAGNESIUM: Magnesium: 1.5 mg/dL — ABNORMAL LOW (ref 1.7–2.4)

## 2023-11-04 LAB — BRAIN NATRIURETIC PEPTIDE: B Natriuretic Peptide: 120 pg/mL — ABNORMAL HIGH (ref 0.0–100.0)

## 2023-11-04 LAB — TROPONIN I (HIGH SENSITIVITY)
Troponin I (High Sensitivity): 12 ng/L (ref <18)
Troponin I (High Sensitivity): 10 ng/L (ref <18)

## 2023-11-04 LAB — RESP PANEL BY RT-PCR (RSV, FLU A&B, COVID)  RVPGX2
Influenza A by PCR: NEGATIVE
Influenza B by PCR: NEGATIVE
Resp Syncytial Virus by PCR: NEGATIVE
SARS Coronavirus 2 by RT PCR: NEGATIVE

## 2023-11-04 LAB — D-DIMER, QUANTITATIVE: D-Dimer, Quant: 2.19 ug{FEU}/mL — ABNORMAL HIGH (ref 0.00–0.50)

## 2023-11-04 LAB — MRSA NEXT GEN BY PCR, NASAL: MRSA by PCR Next Gen: NOT DETECTED

## 2023-11-04 LAB — PHOSPHORUS: Phosphorus: 2.9 mg/dL (ref 2.5–4.6)

## 2023-11-04 MED ORDER — HEPARIN BOLUS VIA INFUSION
5000.0000 [IU] | Freq: Once | INTRAVENOUS | Status: AC
  Administered 2023-11-04: 5000 [IU] via INTRAVENOUS

## 2023-11-04 MED ORDER — SODIUM CHLORIDE 0.9% FLUSH
3.0000 mL | Freq: Two times a day (BID) | INTRAVENOUS | Status: DC
  Administered 2023-11-05: 3 mL via INTRAVENOUS

## 2023-11-04 MED ORDER — ONDANSETRON HCL 4 MG PO TABS: 4.0000 mg | ORAL_TABLET | Freq: Four times a day (QID) | ORAL | Status: DC | PRN

## 2023-11-04 MED ORDER — NITROGLYCERIN 0.4 MG/SPRAY TL SOLN: 1.0000 | Status: DC | PRN

## 2023-11-04 MED ORDER — IOHEXOL 350 MG/ML SOLN
75.0000 mL | Freq: Once | INTRAVENOUS | Status: AC | PRN
  Administered 2023-11-04: 75 mL via INTRAVENOUS

## 2023-11-04 MED ORDER — OXYCODONE HCL 5 MG PO TABS
5.0000 mg | ORAL_TABLET | ORAL | Status: DC | PRN
  Administered 2023-11-04: 5 mg via ORAL
  Filled 2023-11-04 (×3): qty 1

## 2023-11-04 MED ORDER — NITROGLYCERIN 0.4 MG SL SUBL: 0.4000 mg | SUBLINGUAL_TABLET | SUBLINGUAL | Status: DC | PRN

## 2023-11-04 MED ORDER — ACETAMINOPHEN 325 MG PO TABS
650.0000 mg | ORAL_TABLET | Freq: Four times a day (QID) | ORAL | Status: DC | PRN
Start: 2023-11-04 — End: 2023-11-05
  Filled 2023-11-04: qty 2

## 2023-11-04 MED ORDER — SENNOSIDES-DOCUSATE SODIUM 8.6-50 MG PO TABS: 1.0000 | ORAL_TABLET | Freq: Every evening | ORAL | Status: DC | PRN

## 2023-11-04 MED ORDER — FLUTICASONE FUROATE-VILANTEROL 100-25 MCG/ACT IN AEPB
1.0000 | INHALATION_SPRAY | Freq: Every day | RESPIRATORY_TRACT | Status: DC
  Administered 2023-11-05: 1 via RESPIRATORY_TRACT
  Filled 2023-11-04: qty 28

## 2023-11-04 MED ORDER — IPRATROPIUM BROMIDE 0.02 % IN SOLN: 0.5000 mg | Freq: Four times a day (QID) | RESPIRATORY_TRACT | Status: DC | PRN

## 2023-11-04 MED ORDER — RIMEGEPANT SULFATE 75 MG PO TBDP: 1.0000 | ORAL_TABLET | ORAL | Status: DC | PRN

## 2023-11-04 MED ORDER — LEVALBUTEROL HCL 0.63 MG/3ML IN NEBU: 0.6300 mg | INHALATION_SOLUTION | Freq: Four times a day (QID) | RESPIRATORY_TRACT | Status: DC | PRN

## 2023-11-04 MED ORDER — RIZATRIPTAN BENZOATE 10 MG PO TBDP: 10.0000 mg | ORAL_TABLET | Freq: Once | ORAL | Status: DC | PRN

## 2023-11-04 MED ORDER — CHLORHEXIDINE GLUCONATE CLOTH 2 % EX PADS
6.0000 | MEDICATED_PAD | Freq: Every day | CUTANEOUS | Status: DC
  Administered 2023-11-04 – 2023-11-05 (×2): 6 via TOPICAL

## 2023-11-04 MED ORDER — HYDRALAZINE HCL 20 MG/ML IJ SOLN: 10.0000 mg | INTRAMUSCULAR | Status: DC | PRN

## 2023-11-04 MED ORDER — LEVOTHYROXINE SODIUM 25 MCG PO TABS
25.0000 ug | ORAL_TABLET | Freq: Every day | ORAL | Status: DC
  Administered 2023-11-05: 25 ug via ORAL
  Filled 2023-11-04: qty 1

## 2023-11-04 MED ORDER — LEVOTHYROXINE SODIUM 50 MCG PO TABS: 25.0000 ug | ORAL_TABLET | Freq: Every day | ORAL | Status: DC

## 2023-11-04 MED ORDER — ONDANSETRON HCL 4 MG/2ML IJ SOLN
4.0000 mg | Freq: Four times a day (QID) | INTRAMUSCULAR | Status: DC | PRN
  Administered 2023-11-04 – 2023-11-05 (×2): 4 mg via INTRAVENOUS
  Filled 2023-11-04 (×2): qty 2

## 2023-11-04 MED ORDER — BISACODYL 5 MG PO TBEC: 5.0000 mg | DELAYED_RELEASE_TABLET | Freq: Every day | ORAL | Status: DC | PRN

## 2023-11-04 MED ORDER — LIFITEGRAST 5 % OP SOLN: 1.0000 [drp] | Freq: Every day | OPHTHALMIC | Status: DC

## 2023-11-04 MED ORDER — ALPRAZOLAM 0.5 MG PO TABS
1.0000 mg | ORAL_TABLET | Freq: Two times a day (BID) | ORAL | Status: DC | PRN
  Administered 2023-11-04: 1 mg via ORAL
  Filled 2023-11-04: qty 2

## 2023-11-04 MED ORDER — ACETAMINOPHEN 650 MG RE SUPP: 650.0000 mg | Freq: Four times a day (QID) | RECTAL | Status: DC | PRN

## 2023-11-04 MED ORDER — EZETIMIBE 10 MG PO TABS: 10.0000 mg | ORAL_TABLET | Freq: Every day | ORAL | Status: DC

## 2023-11-04 MED ORDER — SODIUM CHLORIDE 0.9% FLUSH
3.0000 mL | Freq: Two times a day (BID) | INTRAVENOUS | Status: DC
  Administered 2023-11-04 – 2023-11-05 (×3): 3 mL via INTRAVENOUS

## 2023-11-04 MED ORDER — FLEET ENEMA RE ENEM: 1.0000 | ENEMA | Freq: Once | RECTAL | Status: DC | PRN

## 2023-11-04 MED ORDER — UMECLIDINIUM BROMIDE 62.5 MCG/ACT IN AEPB
1.0000 | INHALATION_SPRAY | Freq: Every day | RESPIRATORY_TRACT | Status: DC
  Administered 2023-11-05: 1 via RESPIRATORY_TRACT
  Filled 2023-11-04: qty 7

## 2023-11-04 MED ORDER — HEPARIN (PORCINE) 25000 UT/250ML-% IV SOLN
1300.0000 [IU]/h | INTRAVENOUS | Status: DC
  Administered 2023-11-04: 1400 [IU]/h via INTRAVENOUS
  Administered 2023-11-05: 1300 [IU]/h via INTRAVENOUS
  Filled 2023-11-04 (×2): qty 250

## 2023-11-04 MED ORDER — HEPARIN SODIUM (PORCINE) 5000 UNIT/ML IJ SOLN: 5000.0000 [IU] | Freq: Three times a day (TID) | INTRAMUSCULAR | Status: DC

## 2023-11-04 MED ORDER — IRBESARTAN 75 MG PO TABS
75.0000 mg | ORAL_TABLET | Freq: Every day | ORAL | Status: DC
  Administered 2023-11-05: 75 mg via ORAL
  Filled 2023-11-04: qty 1

## 2023-11-04 MED ORDER — TRAZODONE HCL 50 MG PO TABS: 25.0000 mg | ORAL_TABLET | Freq: Every evening | ORAL | Status: DC | PRN

## 2023-11-04 MED ORDER — ZOLPIDEM TARTRATE 5 MG PO TABS: 10.0000 mg | ORAL_TABLET | Freq: Every evening | ORAL | Status: DC | PRN

## 2023-11-04 MED ORDER — FENOFIBRATE 160 MG PO TABS
160.0000 mg | ORAL_TABLET | Freq: Every day | ORAL | Status: DC
  Administered 2023-11-05: 160 mg via ORAL
  Filled 2023-11-04: qty 1

## 2023-11-04 MED ORDER — ZOLPIDEM TARTRATE 5 MG PO TABS
5.0000 mg | ORAL_TABLET | Freq: Every evening | ORAL | Status: DC | PRN
  Administered 2023-11-04: 5 mg via ORAL
  Filled 2023-11-04: qty 1

## 2023-11-04 MED ORDER — HYDROMORPHONE HCL 1 MG/ML IJ SOLN
0.5000 mg | INTRAMUSCULAR | Status: DC | PRN
  Administered 2023-11-05 (×4): 1 mg via INTRAVENOUS
  Filled 2023-11-04 (×4): qty 1

## 2023-11-04 NOTE — TOC CM/SW Note (Signed)
Transition of Care [facility]) - Inpatient Brief Assessment   Patient Details  Name: [name] MRN: [id number] Date of Birth: 1951-05-15  Transition of [facility]) CM/SW Contact:    Villa Herb, LCSWA Phone Number: 11/04/2023, 1:24 PM   Clinical Narrative: [facility] consulted for PCP needs. CSW completed chart review and notes that pt has PCP listed that is following in community. Pt placed on 2L in ED. TOC to follow for possible needs.   Transition of Care Asessment: Insurance and Status: Insurance coverage has been reviewed Patient has primary care physician: Yes Home environment has been reviewed: From home Prior level of function:: Independent Prior/Current Home Services: No current home services Social Drivers of Health Review: SDOH reviewed no interventions necessary Readmission risk has been reviewed: Yes Transition of care needs: no transition of care needs at this time

## 2023-11-04 NOTE — Progress Notes (Signed)
Cardiology Office Note:  .   Date:  11/04/2023  ID:  [name], DOB 1951-02-14, MRN [id number] PCP: Joanne Halon, MD  [name] Cardiologist:  Joanne Dell, MD    History of Present Illness: .   [name] is a 73 y.o. female with a PMH of chest pain, hypertension, mixed hyperlipidemia, history of PE, history of remote stroke, vision loss, sickle cell trait ([place]), G6PD deficiency, who presents today for palpitations, syncope evaluation.   Last seen by [name] on August 30th, 2024.  She noted intermittent chest discomfort, there is no change in pattern or intensity.  She noted rarely having to use nitroglycerin, was taking Xanax when experiencing chest discomfort.  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  She denied any hypotension at the time.  She contacted our office last week noting lightheadedness last Thursday morning when walking in her home, she woke up on the floor and had urinary incontinence.  Since Saturday, she noted her rehab and elevated with HR up to 139 bpm.  Unable to walk due to being short of breath and dizzy.  Vital signs are stable at the time of the phone call with nurse.  [name] recommended ER visit for syncope due to unclear etiology.  Patient was advised to call EMS as she did not have anyone at the time to drive her to ER.  She presents today for evaluation.  She has not been seen in the ED as advised.  She is not feeling well.  Has not been around anyone sick as she has been in her home for the last 3 weeks, she says she did have minimal temperature elevation in the upper 90s before her syncopal episode.  She gets very short winded with short distances.  Says she has not felt right since last Thursday when she had her syncopal episode.  Tells me that she was in the middle fixing a humidifier and the next thing she knows she was on the floor.  Says prior to her  syncopal episode she felt pain along her upper abdomen/breast line, says it felt like a tight band around this area, and felt that she was hyperventilating.  She tells me she has a past history of PE, says this was about 2 years ago.  She had blood work done yesterday and saw her neurologist.  She also tells me she has had a past history of stroke but unsure when this occurred.  Before April 08, 2023, says she had an incident where she lost peripheral vision along her right eye, MRI was performed that showed remote linear lacunar infarct of the posteroinferior right cerebellum, otherwise normal MRI, nothing acute. Does have some chronic vision loss, she says she is only able to see about 10 inches in front of her, otherwise vision is blurry. Has been having headaches for the past few days, says this usually occurs in the AM. Denies any slurred speech, unilateral weakness.    ROS: Negative. See HPI.   Studies Reviewed: Marland Kitchen    EKG: EKG Interpretation Date/Time:  Thursday November 04 2023 08:12:58 EST Ventricular Rate:  87 PR Interval:  148 QRS Duration:  74 QT Interval:  390 QTC Calculation: 469 R Axis:   121  Text Interpretation: Normal sinus rhythm Low voltage QRS Left posterior fascicular block Cannot rule out Anterior infarct (cited on or before 04-Nov-2023) T wave abnormality, consider inferolateral ischemia When  compared with ECG of 04-Jun-2023 13:09, Significant changes have occurred Confirmed by [name] [phone]) on 11/04/2023 8:26:53 AM   Carotid duplex 06/2023: IMPRESSION: Color duplex indicates minimal homogeneous plaque, with no hemodynamically significant stenosis by duplex criteria in the extracranial cerebrovascular circulation.  Echo 05/2022:  1. Left ventricular ejection fraction, by estimation, is 70 to 75%. The  left ventricle has hyperdynamic function. The left ventricle has no  regional wall motion abnormalities. There is mild left ventricular  hypertrophy. Left  ventricular diastolic  parameters are consistent with Grade I diastolic dysfunction (impaired  relaxation). The average left ventricular global longitudinal strain is  -19.7 %. The global longitudinal strain is normal.   2. Right ventricular systolic function is normal. The right ventricular  size is normal. Tricuspid regurgitation signal is inadequate for assessing  PA pressure.   3. The mitral valve is normal in structure. No evidence of mitral valve  regurgitation. No evidence of mitral stenosis.   4. The aortic valve was not well visualized. Aortic valve regurgitation  is not visualized. No aortic stenosis is present.   5. The inferior vena cava is normal in size with greater than 50%  respiratory variability, suggesting right atrial pressure of 3 mmHg.   Comparison(s): Echocardiogram done 08/23/19 showed an EF of 65-70% Nuclear stress test done 02/05/21 showed an EF of >65%.  Lexiscan 02/2021:  There was no ST segment deviation noted during stress. The left ventricular ejection fraction is hyperdynamic (>65%). This is a low risk study. The study is normal. There are no perfusion defects  Risk Assessment/Calculations:           The 10-year ASCVD risk score (Arnett DK, et al., 2019) is: 17%   Values used to calculate the score:     Age: 73 years     Sex: Female     Is Non-Hispanic African American: No     Diabetic: No     Tobacco smoker: No     Systolic Blood Pressure: 135 mmHg     Is BP treated: Yes     HDL Cholesterol: 22 mg/dL     Total Cholesterol: 176 mg/dL  Physical Exam:   VS:  BP 135/83   Pulse 89   Ht 5\' 7"  (1.702 m)   Wt 203 lb 3.2 oz (92.2 kg)   SpO2 98%   BMI 31.83 kg/m    Wt Readings from Last 3 Encounters:  11/04/23 203 lb 3.2 oz (92.2 kg)  11/03/23 204 lb (92.5 kg)  10/04/23 210 lb 9.6 oz (95.5 kg)    GEN: Obese, 73 y.o. female in acute distress NECK: No JVD; No carotid bruits CARDIAC: S1/S2, RRR, no murmurs, rubs, gallops RESPIRATORY: Tachypnea,  dyspnea at rest and with minimal exertion clear and diminished to auscultation without rales, wheezing or rhonchi  EXTREMITIES:  No edema; No deformity   ASSESSMENT AND PLAN: .    Shortness of breath Syncope and collapse History of chest pain/upper abdominal pain Tachycardia   Patient is a 73 year old female with past medical history of chest pain, hypertension, mixed hyperlipidemia, palpitations, history of PE, history of remote stroke, vision loss, sickle cell trait ([place]), G6PD deficiency, who presents today for syncope and palpitations evaluation.   See HPI noted above.  Has not been doing well recently.  She is ill-appearing today in the office, she is showing NYHA class IV symptoms and short of breath at rest and with minimal exertion today.  Orthostatics overall negative.  BP on arrival initially  98/60.  Orthostatics as noted below.   Lying: 123/83, 84 bpm Sitting: 119/81, 87 bpm Standing: 134/80, 88 bpm Standing x 3 minutes: 125/84, 91 bpm Repeat BP: 135/83, 89 bpm   Etiology of syncopal episode is unclear.  She will require ED evaluation based on her presentation and symptoms.  I told her due to her past history of PE and not being on blood thinner therapy at this current time, I am concerned for PE.  Patient is unable to drive herself due to vision loss.  Recommended ED evaluation, patient verbalized understanding is agreeable. Care team stayed with patient and O2 was provided for comfort at 2 liters. This NP gave report to EMS who verbalized understanding of report.       Dispo: Will follow-up with patient after hospital d/c in 1-2 weeks.   Signed, Joanne Dory, NP

## 2023-11-04 NOTE — Assessment & Plan Note (Signed)
-   Stable will monitor her home narcotics -Will be mindful analgesics as patient will be on heparin drip high risk of bleeding

## 2023-11-04 NOTE — Assessment & Plan Note (Signed)
-  No signs of exacerbation, will continue to monitor, continue inhalers supplemental oxygen as needed

## 2023-11-04 NOTE — Final Consult Note (Signed)
[facility] Consultation Oncology  Name: [name]      MRN: [id number]    Location: [facility]  Date: 11/04/2023 Time:4:03 PM   REFERRING PHYSICIAN: Dr. Flossie Dibble  REASON FOR CONSULT: Recurrent pulmonary embolism    HISTORY OF PRESENT ILLNESS: Ms. Parkhill is a very pleasant 73 year old female seen in consultation today at the request of Dr. Flossie Dibble for recurrent pulmonary embolism.  For the last 10 days, she has been experiencing dyspnea on exertion.  She was evaluated at cardiology clinic and was sent to the ER for further evaluation.  CT angiogram in the ER showed multiple bilateral pulmonary emboli with CT evidence of right heart strain.  She denies any pleuritic chest pains.  She denies any recent immobilization.  She had unprovoked pulmonary embolism in October 2016 and was treated with Eliquis for 6 months.  She tolerated it very well at that time.  She is currently on heparin drip.  She also has a history of sickle cell trait and G6PD deficiency.  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  She is widowed and lives by herself at home.  She is independent of all ADLs.  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  No family history of thrombosis.  Father had head and neck cancer with history of exposure to radiation.  Sister had lung cancer.  PAST MEDICAL HISTORY:   Past Medical History:  Diagnosis Date   Anal fissure    Anxiety    Arthritis    Asthma    C. difficile diarrhea 09/17/2021   Cataract    Phreesia 01/05/2021   Chest pain    a. 02/2000 Cath: nl cors, EF 60%;  b. 10/2010 MV: nl LV, no ischemia/infarct;  c. 03/2014 Admit c/p, r/o->grief from husbands death.   Chronic RUQ pain November 28, 2007   EUS slightly dilated CBD (7.66mm), otherwise nl   Depression    Diverticulosis    Eczema    Exposure to chemical inhalation mid 1970s   Fatty liver    G6PD deficiency    Gallstones    GERD  (gastroesophageal reflux disease)    Glaucoma    History of pulmonary embolus (PE)    Treated with Eliquis 27-Nov-2014   HTN (hypertension)    Hypercholesteremia    a. intolerant to statins.   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 She has limited vision in the right eye.   Migraines    inner ocular   Ocular migraine    Palpitations    Pleurisy    Pneumonia 07/16/2021   Patient was exposed to a harsh chemical in the 1970's that created scar tissue in her lungs. She has had pneumonia several times in the past.   PONV (postoperative nausea and vomiting)    Pulmonary emboli (HCC)    Recurrent upper respiratory infection (URI)    Renal cyst    Retinal cyst    Sickle cell trait (HCC)    Tubular adenoma of colon 09/17/2011   Urticaria     ALLERGIES: Allergies  Allergen Reactions   Nutmeg Oil (Myristica Oil) Anaphylaxis    Nut oil- skin irritation   Adhesive [Tape] Other (See Comments)    Blisters skin   Aspirin Other (See Comments)    sickle cell trait--  Not recommended unless emergency   Imitrex [Sumatriptan] Hives   Keflex [Cephalexin] Other (See Comments)    G6PD   Levaquin [Levofloxacin In D5w]     Altered mental status Affects G6PD   Other     Nut Oil- skin irritation    Statins Other (See Comments)    Myopathy - elevated CK   Sulfa Antibiotics Other (See Comments)    Patient has sickle cell trait   Sulfonamide Derivatives Other (See Comments)    Patient has sickle cell trait   Latex Rash      MEDICATIONS: I have reviewed the patient's current medications.     PAST SURGICAL HISTORY Past Surgical History:  Procedure Laterality Date   ABDOMINAL HYSTERECTOMY     ADENOIDECTOMY     ANGIOPLASTY     APPENDECTOMY     BIOPSY N/A 03/14/2013   Procedure: SMALL BOWEL AND GASTRIC BIOPSIES (Procedure #1);  Surgeon: West Bali, MD;  Location: [facility];  Service: Endoscopy;   Laterality: N/A;   CARDIAC CATHETERIZATION Left 02/11/2000   CATARACT EXTRACTION Left    CATARACT EXTRACTION W/PHACO Right 03/19/2021   Procedure: CATARACT EXTRACTION PHACO AND INTRAOCULAR LENS PLACEMENT (IOC) RIGHT 6.75 00:50.4;  Surgeon: Lockie Mola, MD;  [facility];  Service: Ophthalmology;  Laterality: Right;   CHOLECYSTECTOMY  12/2007   COLONOSCOPY  12/22/2010   ZOX:WRUEAV, cecal adenomatous polyp   COLONOSCOPY WITH PROPOFOL N/A 03/31/2016   Dr. Darrick Penna: four sessile polyps rectum/sigmoid colon, diverticulosi, ext/int hemorrhoids. hyperplastic polyps, next tcs 5 years.   COLONOSCOPY WITH PROPOFOL N/A 04/21/2021   anal fissure, non-bleeding internal hemorrhoids, right colon diverticulosis, one 2 mm polyp in ascending, three 4-6 mm polyps in descending colon. Tubular adenomas. 5 year surveillance.   complete hysterectomy     ENTEROSCOPY N/A 03/14/2013   WUJ:WJXB gastritis/ulcers has healed   ESOPHAGOGASTRODUODENOSCOPY  09/22/2011   JYN:WGNF gastritis/Duodenitis   EXCISIONAL HEMORRHOIDECTOMY     FLEXIBLE SIGMOIDOSCOPY N/A 03/14/2013   SLF:3 colon polyp removed/moderate sized internal hemorrhoids   FLEXIBLE SIGMOIDOSCOPY N/A 06/09/2016   Procedure: FLEXIBLE SIGMOIDOSCOPY;  Surgeon: West Bali, MD;  [facility];  Service: Endoscopy;  Laterality: N/A;  rectal polyps times 2   FLEXIBLE SIGMOIDOSCOPY N/A 03/18/2018   Procedure: FLEXIBLE SIGMOIDOSCOPY;  Surgeon: West Bali, MD;  [facility];  Service: Endoscopy;  Laterality: N/A;  9:15am   FOOT SURGERY     bunion removal left   GIVENS CAPSULE STUDY N/A 02/10/2013   Procedure: GIVENS CAPSULE STUDY;  Surgeon: West Bali, MD;  [facility];  Service: Endoscopy;  Laterality: N/A;  730   HEEL SPUR EXCISION     right    HEMORRHOID BANDING N/A 03/14/2013   Procedure: HEMORRHOID BANDING (Procedure #3)  3 bands applied [id number] Exp 02/02/2014 ;  Surgeon: West Bali,  MD;  Location: [facility];  Service: Endoscopy;  Laterality: N/A;   HEMORRHOID SURGERY N/A 04/24/2016   Procedure: EXTENSIVE HEMORRHOIDECTOMY;  Surgeon: Ancil Linsey, MD;  Location: [facility];  Service: General;  Laterality: N/A;   LAPAROSCOPY     adhesions   POLYPECTOMY N/A 03/14/2013   ONG:EXBM Gastritis . ULCERS SEEN ON MAY 6 HAVE HEALED   POLYPECTOMY  03/31/2016   Procedure: POLYPECTOMY;  Surgeon: West Bali, MD;  [facility];  Service: Endoscopy;;  sigmoid colon polyps x2, rectal polyps x2   POLYPECTOMY  04/21/2021   Procedure: POLYPECTOMY;  Surgeon: Lanelle Bal, DO;  [facility];  Service: Endoscopy;;   RECTAL EXAM UNDER ANESTHESIA N/A 07/23/2021   Procedure: RECTAL EXAM UNDER ANESTHESIA;  Surgeon: Romie Levee, MD;  Location: [facility];  Service: General;  Laterality: N/A;   SPHINCTEROTOMY N/A 07/23/2021   Procedure: CHEMICAL SPHINCTEROTOMY;  Surgeon: Romie Levee, MD;  Location: [facility];  Service: General;  Laterality: N/A;   TONSILLECTOMY     TUBAL LIGATION      FAMILY HISTORY: Family History  Problem Relation Age of Onset   Colon cancer Mother        86s   Urticaria Mother    Heart disease Mother    Colon polyps Mother    Cancer Father        oral   Crohn's disease Sister    Cancer Sister        around heart   Colon polyps Sister    Diabetes Brother    Other Brother        intestinal sugery   Colon polyps Brother    Colon cancer Maternal Grandmother    Colon cancer Maternal Grandfather    Colon cancer Paternal Grandfather    Anesthesia problems Neg Hx     SOCIAL HISTORY:  reports that she quit smoking about 27 years ago. Her smoking use included cigarettes. She started smoking about 57 years ago. She has a 15 pack-year smoking history. She has never used smokeless tobacco. She reports that she does not currently use alcohol after a past usage of about 4.0 standard drinks of alcohol per week.  She reports that she does not use drugs.  PERFORMANCE STATUS: The patient's performance status is 1 - Symptomatic but completely ambulatory  PHYSICAL EXAM: Most Recent Vital Signs: Blood pressure (!) 120/93, pulse 93, temperature 98.2 F (36.8 C), temperature source Oral, resp. rate (!) 26, height 5\' 7"  (1.702 m), weight 203 lb 3.2 oz (92.2 kg), SpO2 92%. BP (!) 120/93   Pulse 93   Temp 98.2 F (36.8 C) (Oral)   Resp (!) 26   Ht 5\' 7"  (1.702 m)   Wt 203 lb 3.2 oz (92.2 kg)   SpO2 92%   BMI 31.83 kg/m  General appearance: alert, cooperative, and appears stated age Lungs: clear to auscultation bilaterally Heart: regular rate and rhythm Abdomen: soft, non-tender; bowel sounds normal; no masses,  no organomegaly Extremities:  No edema or cyanosis. Lymph nodes: Cervical, supraclavicular, and axillary nodes normal.  LABORATORY DATA:  Results for orders placed or performed during the hospital encounter of 11/04/23 (from the past 48 hours)  Basic metabolic panel     Status: Abnormal   Collection Time: 11/04/23  9:55 AM  Result Value Ref Range   Sodium 134 (L) 135 - 145 mmol/L   Potassium 3.9 3.5 - 5.1 mmol/L   Chloride 106 98 - 111 mmol/L   CO2 19 (L) 22 - 32 mmol/L   Glucose, Bld 110 (H) 70 - 99 mg/dL    Comment: Glucose reference range applies only to samples taken after fasting for at least 8 hours.   BUN 17 8 - 23 mg/dL   Creatinine, Ser 4.54 (H) 0.44 - 1.00 mg/dL   Calcium 8.9 8.9 - 09.8 mg/dL   GFR, Estimated 50 (L) >60 mL/min    Comment: (NOTE) Calculated using the CKD-EPI Creatinine Equation (2021)    Anion gap 9 5 - 15    Comment: Performed at [facility], [address] [place], [place] [address]  Brain natriuretic peptide     Status: Abnormal   Collection Time: 11/04/23  9:55 AM  Result Value Ref Range   B Natriuretic Peptide 120.0 (H) 0.0 - 100.0 pg/mL    Comment: Performed at [facility], [address], [place], [place] [address]  Troponin I (High Sensitivity)      Status: None   Collection Time: 11/04/23  9:55 AM  Result Value Ref Range   Troponin I (High Sensitivity) 12 <18 ng/L    Comment: (NOTE) Elevated high sensitivity troponin I (hsTnI) values and significant  changes across serial measurements may suggest ACS but many other  chronic and acute conditions are known to elevate hsTnI results.  Refer to the "Links" section for chest pain algorithms and additional  guidance. Performed at [facility], [address] [place], [place] [address]   CBC with Differential     Status: Abnormal   Collection Time: 11/04/23  9:55 AM  Result Value Ref Range   WBC 6.5 4.0 - 10.5 K/uL   RBC 3.80 (L) 3.87 - 5.11 MIL/uL   Hemoglobin 12.3 12.0 - 15.0 g/dL   HCT 09.8 11.9 - 14.7 %   MCV 98.9 80.0 - 100.0 fL   MCH 32.4 26.0 - 34.0 pg   MCHC 32.7 30.0 - 36.0 g/dL   RDW 82.9 56.2 - 13.0 %   Platelets 216 150 - 400 K/uL   nRBC 0.0 0.0 - 0.2 %   Neutrophils Relative % 60 %   Neutro Abs 4.0 1.7 - 7.7 K/uL   Lymphocytes Relative 25 %   Lymphs Abs 1.6 0.7 - 4.0 K/uL   Monocytes Relative 8 %   Monocytes Absolute 0.5 0.1 - 1.0 K/uL   Eosinophils Relative 6 %   Eosinophils Absolute 0.4 0.0 - 0.5 K/uL   Basophils Relative 1 %   Basophils Absolute 0.1 0.0 - 0.1 K/uL   Immature Granulocytes 0 %   Abs Immature Granulocytes 0.02 0.00 - 0.07 K/uL    Comment: Performed at [facility], [address] [place], [place] [address]  Resp panel by RT-PCR (RSV, Flu A&B, Covid) Anterior Nasal Swab     Status: None   Collection Time: 11/04/23  9:55 AM   Specimen: Anterior Nasal Swab  Result Value Ref Range   SARS Coronavirus 2 by RT PCR NEGATIVE NEGATIVE    Comment: (NOTE) SARS-CoV-2 target nucleic acids are NOT DETECTED.  The SARS-CoV-2 RNA is generally detectable in upper respiratory specimens during the acute phase of infection. The lowest concentration of SARS-CoV-2 viral copies this assay can detect is 138 copies/mL. A negative result does not preclude  SARS-Cov-2 infection and should not be used as the sole basis for treatment or other patient management decisions. A negative result may occur with  improper specimen collection/handling, submission of specimen other than nasopharyngeal swab, presence of viral mutation(s) within the areas targeted by this assay, and inadequate number of viral copies(<138 copies/mL). A negative result must be combined with clinical observations, patient history, and epidemiological information. The expected result is Negative.  Fact Sheet for Patients:  BloggerCourse.com  Fact Sheet for Healthcare Providers:  SeriousBroker.it  This test is no t yet approved or cleared by the [place] FDA and  has been authorized for detection and/or diagnosis of SARS-CoV-2 by FDA under an Emergency Use Authorization (EUA). This EUA will remain  in effect (meaning this test can be used) for the duration of the COVID-19 declaration under Section 564(b)(1) of the Act, 21  U.S.C.section 360bbb-3(b)(1), unless the authorization is terminated  or revoked sooner.       Influenza A by PCR NEGATIVE NEGATIVE   Influenza B by PCR NEGATIVE NEGATIVE    Comment: (NOTE) The Xpert Xpress SARS-CoV-2/FLU/RSV plus assay is intended as an aid in the diagnosis of influenza from Nasopharyngeal swab specimens and should not be used as a sole basis for treatment. Nasal washings and aspirates are unacceptable for Xpert Xpress SARS-CoV-2/FLU/RSV testing.  Fact Sheet for Patients: BloggerCourse.com  Fact Sheet for Healthcare Providers: SeriousBroker.it  This test is not yet approved or cleared by the [place] FDA and has been authorized for detection and/or diagnosis of SARS-CoV-2 by FDA under an Emergency Use Authorization (EUA). This EUA will remain in effect (meaning this test can be used) for the duration of the COVID-19  declaration under Section 564(b)(1) of the Act, 21 U.S.C. section 360bbb-3(b)(1), unless the authorization is terminated or revoked.     Resp Syncytial Virus by PCR NEGATIVE NEGATIVE    Comment: (NOTE) Fact Sheet for Patients: BloggerCourse.com  Fact Sheet for Healthcare Providers: SeriousBroker.it  This test is not yet approved or cleared by the [place] FDA and has been authorized for detection and/or diagnosis of SARS-CoV-2 by FDA under an Emergency Use Authorization (EUA). This EUA will remain in effect (meaning this test can be used) for the duration of the COVID-19 declaration under Section 564(b)(1) of the Act, 21 U.S.C. section 360bbb-3(b)(1), unless the authorization is terminated or revoked.  Performed at [facility], [address], [place], [place] [address]   D-dimer, quantitative     Status: Abnormal   Collection Time: 11/04/23  9:55 AM  Result Value Ref Range   D-Dimer, Quant 2.19 (H) 0.00 - 0.50 ug/mL-FEU    Comment: (NOTE) At the manufacturer cut-off value of 0.5 g/mL FEU, this assay has a negative predictive value of 95-100%.This assay is intended for use in conjunction with a clinical pretest probability (PTP) assessment model to exclude pulmonary embolism (PE) and deep venous thrombosis (DVT) in outpatients suspected of PE or DVT. Results should be correlated with clinical presentation. Performed at [facility], [address], [place], [place] [address]   Magnesium     Status: Abnormal   Collection Time: 11/04/23  9:55 AM  Result Value Ref Range   Magnesium 1.5 (L) 1.7 - 2.4 mg/dL    Comment: Performed at [facility], [address] [place], [place] [address]  Phosphorus     Status: None   Collection Time: 11/04/23  9:55 AM  Result Value Ref Range   Phosphorus 2.9 2.5 - 4.6 mg/dL    Comment: Performed at [facility], [address] [place], [place] [address]  Troponin I (High Sensitivity)      Status: None   Collection Time: 11/04/23 11:21 AM  Result Value Ref Range   Troponin I (High Sensitivity) 10 <18 ng/L    Comment: (NOTE) Elevated high sensitivity troponin I (hsTnI) values and significant  changes across serial measurements may suggest ACS but many other  chronic and acute conditions are known to elevate hsTnI results.  Refer to the "Links" section for chest pain algorithms and additional  guidance. Performed at [facility], [address], [place], [place] [address]    *Note: Due to a large number of results and/or encounters for the requested time period, some results have not been displayed. A complete set of results can be found in Results Review.      RADIOGRAPHY: ECHOCARDIOGRAM  COMPLETE Result Date: 11/04/2023    ECHOCARDIOGRAM REPORT   Patient Name:   TYTIANNA GREENLEY Date of Exam: 11/04/2023 Medical Rec #:  [id number]        Height:       67.0 in Accession #:    [id number]       Weight:       203.2 lb Date of Birth:  [birth year]         BSA:          2.036 m Patient Age:    73 years         BP:           120/60 mmHg Patient Gender: F                HR:           86 bpm. [facility] Procedure: 2D Echo, Cardiac Doppler and Color Doppler STAT ECHO Indications:    Pulmonary Embolus I26.09  History:        Patient has prior history of Echocardiogram examinations, most                 recent 05/21/2022. Risk Factors:Former Smoker, Dyslipidemia and                 Hypertension. Morbidly obese, Hx pulmonary embolism.  Sonographer:    Celesta Gentile RCS Referring Phys: [id number] MICHAEL C BUTLER IMPRESSIONS  1. Left ventricular ejection fraction, by estimation, is 70 to 75%. The left ventricle has hyperdynamic function. The left ventricle has no regional wall motion abnormalities. There is mild left ventricular hypertrophy. Left ventricular diastolic parameters are consistent with Grade I diastolic dysfunction (impaired relaxation).  2. Right ventricular systolic function is  moderately reduced. The right ventricular size is moderately enlarged. Tricuspid regurgitation signal is inadequate for assessing PA pressure.  3. A small pericardial effusion is present. The pericardial effusion is circumferential.  4. The mitral valve is normal in structure. No evidence of mitral valve regurgitation. No evidence of mitral stenosis.  5. The aortic valve is tricuspid. Aortic valve regurgitation is not visualized. No aortic stenosis is present.  6. The inferior vena cava is normal in size with greater than 50% respiratory variability, suggesting right atrial pressure of 3 mmHg. FINDINGS  Left Ventricle: Left ventricular ejection fraction, by estimation, is 70 to 75%. The left ventricle has hyperdynamic function. The left ventricle has no regional wall motion abnormalities. The left ventricular internal cavity size was normal in size. There is mild left ventricular hypertrophy. Left ventricular diastolic parameters are consistent with Grade I diastolic dysfunction (impaired relaxation). Normal left ventricular filling pressure. Right Ventricle: The right ventricular size is moderately enlarged. Right vetricular wall thickness was not well visualized. Right ventricular systolic function is moderately reduced. Tricuspid regurgitation signal is inadequate for assessing PA pressure. Left Atrium: Left atrial size was normal in size. Right Atrium: Right atrial size was not well visualized. Pericardium: A small pericardial effusion is present. The pericardial effusion is circumferential. Mitral Valve: The mitral valve is normal in structure. No evidence of mitral valve regurgitation. No evidence of mitral valve stenosis. Tricuspid Valve: The tricuspid valve is not well visualized. Tricuspid valve regurgitation is not demonstrated. No evidence of tricuspid stenosis. Aortic Valve: The aortic valve is tricuspid. Aortic valve regurgitation is not visualized. No aortic stenosis is present. Aortic valve mean  gradient measures 3.9 mmHg. Aortic valve peak gradient measures 8.3 mmHg. Aortic valve area, by VTI measures 2.21  cm. Pulmonic Valve: The pulmonic valve was not well visualized. Pulmonic valve regurgitation is not visualized. No evidence of pulmonic stenosis. Aorta: The aortic root is normal in size and structure. Venous: The inferior vena cava is normal in size with greater than 50% respiratory variability, suggesting right atrial pressure of 3 mmHg. IAS/Shunts: No atrial level shunt detected by color flow Doppler.  LEFT VENTRICLE PLAX 2D LVIDd:         4.10 cm   Diastology LVIDs:         2.50 cm   LV e' medial:    6.20 cm/s LV PW:         1.10 cm   LV E/e' medial:  10.2 LV IVS:        1.10 cm   LV e' lateral:   7.51 cm/s LVOT diam:     1.90 cm   LV E/e' lateral: 8.4 LV SV:         56 LV SV Index:   28 LVOT Area:     2.84 cm  RIGHT VENTRICLE RV S prime:     12.80 cm/s TAPSE (M-mode): 2.2 cm LEFT ATRIUM             Index        RIGHT ATRIUM           Index LA diam:        4.00 cm 1.96 cm/m   RA Area:     24.00 cm LA Vol (A2C):   42.3 ml 20.78 ml/m  RA Volume:   88.50 ml  43.47 ml/m LA Vol (A4C):   50.2 ml 24.66 ml/m LA Biplane Vol: 49.7 ml 24.41 ml/m  AORTIC VALVE AV Area (Vmax):    2.23 cm AV Area (Vmean):   2.00 cm AV Area (VTI):     2.21 cm AV Vmax:           143.81 cm/s AV Vmean:          90.609 cm/s AV VTI:            0.255 m AV Peak Grad:      8.3 mmHg AV Mean Grad:      3.9 mmHg LVOT Vmax:         113.00 cm/s LVOT Vmean:        63.800 cm/s LVOT VTI:          0.199 m LVOT/AV VTI ratio: 0.78  AORTA Ao Root diam: 3.30 cm MITRAL VALVE MV Area (PHT): 2.76 cm     SHUNTS MV Decel Time: 275 msec     Systemic VTI:  0.20 m MV E velocity: 63.40 cm/s   Systemic Diam: 1.90 cm MV A velocity: 100.00 cm/s MV E/A ratio:  0.63 Dina Rich MD Electronically signed by Dina Rich MD Signature Date/Time: 11/04/2023/1:11:43 PM    Final    CT Angio Chest PE W/Cm &/Or Wo Cm Result Date: 11/04/2023 CLINICAL  DATA:  Pulmonary embolism (PE) suspected, high prob. Respiratory distress, shortness of breath EXAM: CT ANGIOGRAPHY CHEST WITH CONTRAST TECHNIQUE: Multidetector CT imaging of the chest was performed using the standard protocol during bolus administration of intravenous contrast. Multiplanar CT image reconstructions and MIPs were obtained to evaluate the vascular anatomy. RADIATION DOSE REDUCTION: This exam was performed according to the departmental dose-optimization program which includes automated exposure control, adjustment of the mA and/or kV according to patient size and/or use of iterative reconstruction technique. CONTRAST:  75mL OMNIPAQUE IOHEXOL 350 MG/ML SOLN COMPARISON:  Chest x-ray today.  CT 07/26/2015 FINDINGS: Cardiovascular: Numerous bilateral filling defects within the pulmonary arteries compatible with pulmonary emboli. Evidence of right heart strain with elevated RV/LV ratio of 1.45. Heart is borderline in size. Small pericardial effusion. Aorta normal caliber. Mediastinum/Nodes: No mediastinal, hilar, or axillary adenopathy. Trachea and esophagus are unremarkable. Thyroid unremarkable. Lungs/Pleura: No confluent opacities or effusions. Upper Abdomen: No acute findings Musculoskeletal: Chest wall soft tissues are unremarkable. No acute bony abnormality. Review of the MIP images confirms the above findings. IMPRESSION: Multiple bilateral pulmonary emboli. CT evidence of right heart strain (RV/LV Ratio = 1.45) consistent with at least submassive (intermediate risk) PE. The presence of right heart strain has been associated with an increased risk of morbidity and mortality. Please refer to the "Code PE Focused" order set in EPIC. Borderline heart size.  Small pericardial effusion. These results were called by telephone at the time of interpretation on 11/04/2023 at 11:28 am to provider [facility] , who verbally acknowledged these results. Electronically Signed   By: Charlett Nose M.D.   On:  11/04/2023 11:29   DG Chest Port 1 View Result Date: 11/04/2023 CLINICAL DATA:  Shortness of breath. EXAM: PORTABLE CHEST 1 VIEW COMPARISON:  05/12/2022. FINDINGS: Bilateral lung fields are clear. Bilateral costophrenic angles are clear. Normal cardio-mediastinal silhouette. No acute osseous abnormalities. The soft tissues are within normal limits. IMPRESSION: No active disease. Electronically Signed   By: Jules Schick M.D.   On: 11/04/2023 10:42         ASSESSMENT and PLAN:  1.  Recurrent unprovoked pulmonary embolism: - First episode of unprovoked PE on 07/26/2015 with CT scan showing filling defect in the lower lobe branch of the left pulmonary artery.  Ultrasound Doppler of lower extremities with no DVT.  She was treated with 6 months of Eliquis which was discontinued. - Dyspnea on exertion for the last 10 days.  CT angiogram on 11/04/2023 showed multiple bilateral pulmonary emboli with CT evidence of right heart strain consistent with at least submassive pulmonary embolism.  An ultrasound Doppler of the lower extremities is pending. - Also has history of sickle cell trait ([place] ancestry), and G6PD deficiency.  Previous history of blood transfusion at the time of delivery. - She is currently on heparin drip and is stable.  Recommend switching to Eliquis at the time of discharge. - Due to 2 episodes of unprovoked pulmonary embolism, recommend indefinite anticoagulation. - She will follow-up with me in the clinic in 3 months.  We will workup for lupus anticoagulant, anticardiolipin antibodies, antibeta 2 glycoprotein 1 antibodies.  If she is found to have antiphospholipid syndrome, will switch to Coumadin. - Will discuss with Dr. Flossie Dibble.  All questions were answered. The patient knows to call the clinic with any problems, questions or concerns. We can certainly see the patient much sooner if necessary.    Doreatha Massed

## 2023-11-04 NOTE — Progress Notes (Signed)
PHARMACY - ANTICOAGULATION CONSULT NOTE  Pharmacy Consult for heparin Indication: pulmonary embolus  Allergies  Allergen Reactions   Nutmeg Oil (Myristica Oil) Anaphylaxis    Nut oil- skin irritation   Adhesive [Tape] Other (See Comments)    Blisters skin   Aspirin Other (See Comments)    sickle cell trait--  Not recommended unless emergency   Imitrex [Sumatriptan] Hives   Keflex [Cephalexin] Other (See Comments)    G6PD   Levaquin [Levofloxacin In D5w]     Altered mental status Affects G6PD   Other     Nut Oil- skin irritation    Statins Other (See Comments)    Myopathy - elevated CK   Sulfa Antibiotics Other (See Comments)    Patient has sickle cell trait   Sulfonamide Derivatives Other (See Comments)    Patient has sickle cell trait   Latex Rash   Patient Measurements: Height: 5\' 7"  (170.2 cm) Weight: 92.2 kg (203 lb 3.2 oz) IBW/kg (Calculated) : 61.6 Heparin Dosing Weight: 82 kg  Vital Signs: Temp: 98 F (36.7 C) (01/30 1850) Temp Source: Oral (01/30 1850) BP: 144/53 (01/30 1750) Pulse Rate: 96 (01/30 1850)  Labs: Recent Labs    11/03/23 1246 11/04/23 0955 11/04/23 1121 11/04/23 1840  HGB  --  12.3  --   --   HCT  --  37.6  --   --   PLT  --  216  --   --   HEPARINUNFRC  --   --   --  0.80*  CREATININE 1.31* 1.16*  --   --   TROPONINIHS  --  12 10  --    Estimated Creatinine Clearance: 51.8 mL/min (A) (by C-G formula based on SCr of 1.16 mg/dL (H)).  Medical History: Past Medical History:  Diagnosis Date   Anal fissure    Anxiety    Arthritis    Asthma    C. difficile diarrhea 09/17/2021   Cataract    Phreesia 01/05/2021   Chest pain    a. 02/2000 Cath: nl cors, EF 60%;  b. 10/2010 MV: nl LV, no ischemia/infarct;  c. 03/2014 Admit c/p, r/o->grief from husbands death.   Chronic RUQ pain 11/25/2007   EUS slightly dilated CBD (7.46mm), otherwise nl   Depression    Diverticulosis    Eczema    Exposure to chemical inhalation mid 1970s   Fatty liver     G6PD deficiency    Gallstones    GERD (gastroesophageal reflux disease)    Glaucoma    History of pulmonary embolus (PE)    Treated with Eliquis 11/24/2014   HTN (hypertension)    Hypercholesteremia    a. intolerant to statins.   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 She has limited vision in the right eye.   Migraines    inner ocular   Ocular migraine    Palpitations    Pleurisy    Pneumonia 07/16/2021   Patient was exposed to a harsh chemical in the 1970's that created scar tissue in her lungs. She has had pneumonia several times in the past.   PONV (postoperative nausea and vomiting)    Pulmonary emboli (HCC)    Recurrent upper respiratory infection (URI)    Renal cyst    Retinal cyst    Sickle cell trait (HCC)    Tubular adenoma of colon 09/17/2011  Urticaria    Medications:  Medications Prior to Admission  Medication Sig Dispense Refill Last Dose/Taking   albuterol (PROVENTIL) (2.5 MG/3ML) 0.083% nebulizer solution Take 3 mLs (2.5 mg total) by nebulization every 4 (four) hours as needed for wheezing or shortness of breath. 150 mL 3 10/31/2023   albuterol (VENTOLIN HFA) 108 (90 Base) MCG/ACT inhaler Inhale 2 puffs into the lungs every 4 (four) hours as needed for wheezing. 54 g 3 11/03/2023 Morning   ALPRAZolam (XANAX) 1 MG tablet Take 1 tablet (1 mg total) by mouth 2 (two) times daily as needed for anxiety. (Patient taking differently: Take 0.5-1 mg by mouth at bedtime.) 180 tablet 3 11/03/2023 Bedtime   brimonidine (ALPHAGAN) 0.2 % ophthalmic solution Place 1 drop into both eyes 2 (two) times daily. 15 mL 3 11/04/2023 Morning   chlorpheniramine-HYDROcodone (TUSSIONEX) 10-8 MG/5ML Take 5 mLs by mouth every 12 (twelve) hours as needed for cough. 115 mL 0 11/02/2023   Cholecalciferol (VITAMIN D3) 50 MCG (2000 UT) TABS Take 2,000 Units by mouth daily.   11/03/2023 Bedtime   dexlansoprazole  (DEXILANT) 60 MG capsule Take 1 capsule (60 mg total) by mouth daily. 90 capsule 3 Past Week   fenofibrate (TRICOR) 145 MG tablet Take 1 tablet (145 mg total) by mouth daily. 90 tablet 3 11/03/2023 Bedtime   Fluticasone-Umeclidin-Vilant (TRELEGY ELLIPTA) 100-62.5-25 MCG/ACT AEPB Inhale 1 puff into the lungs daily. 3 each 4 Past Week   irbesartan (AVAPRO) 150 MG tablet Take 0.5 tablets (75 mg total) by mouth daily. 45 tablet 1 11/03/2023 Bedtime   latanoprost (XALATAN) 0.005 % ophthalmic solution Place 1 drop into both eyes at bedtime. 9 mL 3 11/03/2023 Bedtime   levothyroxine (SYNTHROID) 25 MCG tablet Take 1 tablet (25 mcg total) by mouth daily. 90 tablet 1 11/04/2023 Morning   Magnesium 500 MG TABS Take 1 tablet by mouth 2 (two) times daily with a meal.   11/03/2023 Bedtime   meclizine (ANTIVERT) 12.5 MG tablet Take 1 tablet (12.5 mg total) by mouth 3 (three) times daily as needed for dizziness. 30 tablet 3 11/03/2023 Morning   nitroGLYCERIN (NITROLINGUAL) 0.4 MG/SPRAY spray Place 1 spray under the tongue every 5 (five) minutes as needed for chest pain. 12 g 3 Taking As Needed   NON FORMULARY Take 1 tablet by mouth daily as needed (Stomach Bloat). FD GUARD   11/03/2023 Morning   Nutritional Supplements (IBS SUPPORT PO) Take 1 tablet by mouth daily as needed (stomach aches). FD Guard and IB Guard Peppermint/Fennel seeds   11/03/2023 Bedtime   ondansetron (ZOFRAN) 4 MG tablet TAKE ONE TABLET BY MOUTH EVERY 8 HOURS AS NEEDED FOR NAUSEA OR VOMITING 30 tablet 1 11/03/2023 Morning   Phenyleph-Doxylamine-DM-APAP (TYLENOL COLD/FLU/COUGH NIGHT PO) Take 2 tablets by mouth daily as needed (cough/cold).   11/03/2023 Morning   Probiotic Product (PROBIOTIC DAILY PO) Take 1 capsule by mouth daily. VSL #3   11/03/2023 Bedtime   rizatriptan (MAXALT-MLT) 10 MG disintegrating tablet Take 1 tablet (10 mg total) by mouth as needed for migraine. May repeat in 2 hours if needed. (ok to send out this medication to the patient, the  patient can tolerate this medication and will be monitored, if this is not acceptable please cancel this script and send in a new erx with a note, thanks) 9 tablet 6 11/03/2023 Morning   sodium chloride (OCEAN) 0.65 % SOLN nasal spray Place 1 spray into both nostrils as needed for congestion.   11/03/2023 Bedtime   zolpidem (AMBIEN) 10  MG tablet Take 1 tablet (10 mg total) by mouth at bedtime as needed for sleep. 90 tablet 1 11/03/2023 Bedtime   Assessment: Pharmacy consulted to dose heparin in patient with pulmonary embolism.  CT shows submassive PE w/ right heart strain. Patient is not on anticoagulation prior to admission. CBC WNL, D-Dimer 2.19  Date              HL          Rate 1/30 1840     0.80         Supratherapeutic; 1400 units/hr  Goal of Therapy:  Heparin level 0.3-0.7 units/ml Monitor platelets by anticoagulation protocol: Yes   Plan:  HL supratherapeutic  Decrease heparin rate to 1300 units/hr  Check next HL 8 hours after rate adjustment  Monitor CBC daily and for signs/symptoms of bleeding   Littie Deeds, PharmD Pharmacy Resident  11/04/2023 7:07 PM

## 2023-11-04 NOTE — Progress Notes (Signed)
*  PRELIMINARY RESULTS* Echocardiogram 2D Echocardiogram has been performed.  Stacey Drain 11/04/2023, 1:02 PM

## 2023-11-04 NOTE — Hospital Course (Addendum)
[name] is a 73 year old female with extensive history of HTN, HLD, Hypothyroidism, chronic back pain (spinal stenosis, DDD) insomnia, asthma, anxiety, depression, sickle cell trait, history of PE previously treated... . Presented today from her doctor office for chief complaint of shortness of breath.  Reporting progressive shortness of breath x 1 month and mild tightness in her chest with cough.  Cough is sometimes productive with yellowish sputum. She had an episode of syncopal episode a week ago,  Saw her cardiologist today who referred her to ED for further evaluation with a history of PE, syncope and now shortness of breath.   ED evaluation/course: Blood pressure 121/60, pulse 88, temperature 98.2 F (36.8 C), RR 20,  weight 92.2 kg, SpO2 96%.  On room air  CBC CMP reviewed all within normal limits with exception of sodium 134, CO2 of 19, creatinine 1.16, Troponin 12, 10, BNP 120, magnesium 1.5  [id number] bilateral pulmonary emboli. CT evidence of right heart strain (RV/LV Ratio = 1.45) consistent with at least submassive (intermediate risk) PE  Patient was started on heparin drip EDP Dr. Charm Barges has discussed the case with PCCM Dr. Kerin Perna recommend urgent echocardiogram Does not recommend emergent thrombectomy and transfer

## 2023-11-04 NOTE — Assessment & Plan Note (Addendum)
-   History of back pain including spinal stenosis -Currently stable -Continue home dose analgesics with as needed IV meds

## 2023-11-04 NOTE — ED Triage Notes (Signed)
Pt arrived via [facility] from [facility], where EMS were called in response to a respiratory distress. Pt placed on 2L O2 PTA. Pt reports recent syncopal episode, and worsening SOB over the past week.

## 2023-11-04 NOTE — Consult Note (Signed)
   PCCM transfer request    Consulting physician: Gardner Candle  Sending facility: [facility]  Reason for transfer: Submassive PE  Brief case summary: 73 year old woman with prior PE, now with 1 month of progressive SOBOE. Hemodynamically stable and on room air. CTPE positive for bilateral PE. Moderate RV dilatation confirmed on echo.   Recommendations made prior to transfer: as patient on room air with normal vitals and some chronicity, likely to do well with anticoagulation alone. No clear cut indication for clot removal.  May be difficult to de-bulk subacute clot.   Transfer accepted: no, patient can stay at [facility] and transition to [facility] in 24h. Advised to call back if patient starts to deteriorates.  Hospitalist in agreement in with plan.     [name] 11/04/23 1:40 PM [facility]  For contact information, see Amion. If no response to pager, please call PCCM consult pager. After hours, 7PM- 7AM, please call Elink.

## 2023-11-04 NOTE — Assessment & Plan Note (Signed)
-   Patient not on statins, will continue home medication of Zetia, fenofibrate

## 2023-11-04 NOTE — Assessment & Plan Note (Signed)
-  Continue home dose Synthroid

## 2023-11-04 NOTE — H&P (Signed)
History and Physical   Patient: [name]                            PCP: Anabel Halon, MD                    DOB: 1950-12-10            DOA: 11/04/2023 ZOX:[id number]             DOS: 11/04/2023, 2:52 PM  Anabel Halon, MD  Patient coming from:   HOME  I have personally reviewed patient's medical records, in electronic medical records, including:  [place] [facility], and care everywhere.    Chief Complaint:   Chief Complaint  Patient presents with   Shortness of Breath    History of present illness:    [name] is a 73 year old female with extensive history of HTN, HLD, Hypothyroidism, chronic back pain (spinal stenosis, DDD) insomnia, asthma, anxiety, depression, sickle cell trait, history of PE previously treated... . Presented today from her doctor office for chief complaint of shortness of breath.  Reporting progressive shortness of breath x 1 month and mild tightness in her chest with cough.  Cough is sometimes productive with yellowish sputum. She had an episode of syncopal episode a week ago,  Saw her cardiologist today who referred her to ED for further evaluation with a history of PE, syncope and now shortness of breath.   ED evaluation/course: Blood pressure 121/60, pulse 88, temperature 98.2 F (36.8 C), RR 20,  weight 92.2 kg, SpO2 96%.  On room air  CBC CMP reviewed all within normal limits with exception of sodium 134, CO2 of 19, creatinine 1.16, Troponin 12, 10, BNP 120, magnesium 1.5  WJX:[id number] bilateral pulmonary emboli. CT evidence of right heart strain (RV/LV Ratio = 1.45) consistent with at least submassive (intermediate risk) PE  Patient was started on heparin drip EDP Dr. Charm Barges has discussed the case with PCCM Dr. Kerin Perna recommend urgent echocardiogram Does not recommend emergent thrombectomy and transfer     Patient Denies having: Fever, Chills, Chest Pain, Abd pain, N/V/D, headache, dizziness, lightheadedness,  Dysuria,  Joint pain, rash, open wounds    Review of Systems: As per HPI, otherwise 10 point review of systems were negative.   ----------------------------------------------------------------------------------------------------------------------  Allergies  Allergen Reactions   Nutmeg Oil (Myristica Oil) Anaphylaxis    Nut oil- skin irritation   Adhesive [Tape] Other (See Comments)    Blisters skin   Aspirin Other (See Comments)    sickle cell trait--  Not recommended unless emergency   Imitrex [Sumatriptan] Hives   Keflex [Cephalexin] Other (See Comments)    G6PD   Levaquin [Levofloxacin In D5w]     Altered mental status Affects G6PD   Other     Nut Oil- skin irritation    Statins Other (See Comments)    Myopathy - elevated CK   Sulfa Antibiotics Other (See Comments)    Patient has sickle cell trait   Sulfonamide Derivatives Other (See Comments)    Patient has sickle cell trait   Latex Rash    Home MEDs:  Prior to Admission medications   Medication Sig Start Date End Date Taking? Authorizing Provider  albuterol (PROVENTIL) (2.5 MG/3ML) 0.083% nebulizer solution Take 3 mLs (2.5 mg total) by nebulization every 4 (four) hours as needed for wheezing or shortness of breath. 03/29/23   Anabel Halon, MD  albuterol (  VENTOLIN HFA) 108 (90 Base) MCG/ACT inhaler Inhale 2 puffs into the lungs every 4 (four) hours as needed for wheezing. 10/25/23   Anabel Halon, MD  ALPRAZolam Prudy Feeler) 1 MG tablet Take 1 tablet (1 mg total) by mouth 2 (two) times daily as needed for anxiety. Patient taking differently: Take 0.5-1 mg by mouth at bedtime. 11/25/20   Salley Scarlet, MD  brimonidine (ALPHAGAN) 0.2 % ophthalmic solution Place 1 drop into both eyes 2 (two) times daily. 11/25/20   Salley Scarlet, MD  chlorpheniramine-HYDROcodone (TUSSIONEX) 10-8 MG/5ML Take 5 mLs by mouth every 12 (twelve) hours as needed for cough. 10/04/23   Anabel Halon, MD  Cholecalciferol (VITAMIN D3) 50 MCG (2000 UT)  TABS Take 2,000 Units by mouth daily.    [provider]  dexlansoprazole (DEXILANT) 60 MG capsule Take 1 capsule (60 mg total) by mouth daily. 08/10/23   Imogene Burn, MD  diclofenac Sodium (VOLTAREN) 1 % GEL Apply 4 g topically 4 (four) times daily. 10/01/22   Anabel Halon, MD  DUREZOL 0.05 % EMUL Place 1 drop into the right eye daily. 02/17/21   [provider]  estradiol (ESTRACE VAGINAL) 0.1 MG/GM vaginal cream Place 1 Applicatorful vaginally daily as needed. 01/28/22   Anabel Halon, MD  ezetimibe (ZETIA) 10 MG tablet Take 1 tablet (10 mg total) by mouth daily. 06/04/23 09/02/23  Jonelle Sidle, MD  fenofibrate (TRICOR) 145 MG tablet Take 1 tablet (145 mg total) by mouth daily. 04/21/23   Anabel Halon, MD  Fluticasone-Umeclidin-Vilant (TRELEGY ELLIPTA) 100-62.5-25 MCG/ACT AEPB Inhale 1 puff into the lungs daily. 10/04/23   Anabel Halon, MD  HYDROcodone-acetaminophen (NORCO/VICODIN) 5-325 MG tablet Take 1 tablet by mouth every 8 (eight) hours as needed for moderate pain (pain score 4-6). 10/04/23   Anabel Halon, MD  hyoscyamine (ANASPAZ) 0.125 MG TBDP disintergrating tablet Place 1 tablet (0.125 mg total) under the tongue daily as needed (IBS). Place 0.125 mg under the tongue daily as needed (IBS). 09/03/21   Anabel Halon, MD  irbesartan (AVAPRO) 150 MG tablet Take 0.5 tablets (75 mg total) by mouth daily. 10/04/23   Anabel Halon, MD  latanoprost (XALATAN) 0.005 % ophthalmic solution Place 1 drop into both eyes at bedtime. 11/25/20   Salley Scarlet, MD  levothyroxine (SYNTHROID) 25 MCG tablet Take 1 tablet (25 mcg total) by mouth daily. 05/05/23   Roma Kayser, MD  Lidocaine-Hydrocortisone Ace 3-2.5 % KIT APPLY TO RECTUM QID for 2 weeks then AS NEEDED FOR RECTAL PAIN OR BLEEDING 04/21/21   Marletta Lor, Hennie Duos, DO  Lifitegrast (XIIDRA) 5 % SOLN INSTILL 1 DROP IN BOTH EYES EVERY DAY AS NEEDED (EACH CONTAINER SHOULD YIELD 2 DROPS) STORE IN ORIGINAL FOIL  POUCH Patient taking differently: Place 1 drop into both eyes daily. [facility] CONTAINER SHOULD YIELD 2 DROPS) STORE IN ORIGINAL FOIL POUCH 11/25/20   Salley Scarlet, MD  Magnesium 500 MG TABS Take 1 tablet by mouth 2 (two) times daily with a meal.    [provider]  meclizine (ANTIVERT) 12.5 MG tablet Take 1 tablet (12.5 mg total) by mouth 3 (three) times daily as needed for dizziness. 10/04/23   Anabel Halon, MD  Menthol, Topical Analgesic, (BIOFREEZE EX) Apply 1 application topically daily as needed (pain).    [provider]  metroNIDAZOLE (METROCREAM) 0.75 % cream Apply 1 application. topically as needed. 01/29/22   Anabel Halon, MD  naproxen (NAPROSYN)  500 MG tablet Take 1 tablet (500 mg total) by mouth 2 (two) times daily with a meal. 10/06/22   Darreld Mclean, MD  neomycin-polymyxin b-dexamethasone (MAXITROL) 3.5-10000-0.1 OINT Place 1 application into both eyes at bedtime as needed. Patient taking differently: Place 1 application  into both eyes at bedtime as needed (Dry eyes). 01/12/20   Turney, Velna Hatchet, MD  nitroGLYCERIN (NITROLINGUAL) 0.4 MG/SPRAY spray Place 1 spray under the tongue every 5 (five) minutes as needed for chest pain. 10/04/23   Anabel Halon, MD  NON FORMULARY Take 1 tablet by mouth daily as needed (Stomach Bloat). FD GUARD    [provider]  ondansetron (ZOFRAN) 4 MG tablet TAKE ONE TABLET BY MOUTH EVERY 8 HOURS AS NEEDED FOR NAUSEA OR VOMITING 09/23/23   Anabel Halon, MD  prednisoLONE acetate (PRED FORTE) 1 % ophthalmic suspension Place 1 drop into the right eye 3 (three) times daily. 11/05/21   [provider]  Probiotic Product (PROBIOTIC DAILY PO) Take 1 capsule by mouth daily. VSL #3    [provider]  Rimegepant Sulfate (NURTEC) 75 MG TBDP Take 1 tablet by mouth as needed.    [provider]  rizatriptan (MAXALT-MLT) 10 MG disintegrating tablet Take 1 tablet (10 mg total) by mouth as needed for migraine. May  repeat in 2 hours if needed. (ok to send out this medication to the patient, the patient can tolerate this medication and will be monitored, if this is not acceptable please cancel this script and send in a new erx with a note, thanks) 10/21/22   Windell Norfolk, MD  UNABLE TO FIND Place 1 application rectally daily as needed. [facility]  Med Name: lidocaine 5% pramoxine 1% ointment per rectum as needed.    [provider]  zolpidem (AMBIEN) 10 MG tablet Take 1 tablet (10 mg total) by mouth at bedtime as needed for sleep. 07/22/23   Anabel Halon, MD    PRN MEDs: acetaminophen **OR** acetaminophen, ALPRAZolam, bisacodyl, hydrALAZINE, HYDROmorphone (DILAUDID) injection, ipratropium, levalbuterol, nitroGLYCERIN, ondansetron **OR** ondansetron (ZOFRAN) IV, oxyCODONE, Rimegepant Sulfate, rizatriptan, senna-docusate, sodium phosphate, zolpidem  Past Medical History:  Diagnosis Date   Anal fissure    Anxiety    Arthritis    Asthma    C. difficile diarrhea 09/17/2021   Cataract    Phreesia 01/05/2021   Chest pain    a. 02/2000 Cath: nl cors, EF 60%;  b. 10/2010 MV: nl LV, no ischemia/infarct;  c. 03/2014 Admit c/p, r/o->grief from husbands death.   Chronic RUQ pain 2007-12-09   EUS slightly dilated CBD (7.40mm), otherwise nl   Depression    Diverticulosis    Eczema    Exposure to chemical inhalation mid 1970s   Fatty liver    G6PD deficiency    Gallstones    GERD (gastroesophageal reflux disease)    Glaucoma    History of pulmonary embolus (PE)    Treated with Eliquis 12-08-14   HTN (hypertension)    Hypercholesteremia    a. intolerant to statins.   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 She has limited vision in the right eye.   Migraines    inner ocular   Ocular migraine    Palpitations    Pleurisy    Pneumonia 07/16/2021   Patient was exposed to a harsh chemical in the 1970's that  created scar tissue in her lungs. She has had pneumonia several times in the past.   PONV (postoperative nausea and vomiting)    Pulmonary emboli (HCC)    Recurrent upper respiratory infection (URI)    Renal cyst    Retinal cyst    Sickle cell trait (HCC)    Tubular adenoma of colon 09/17/2011   Urticaria     Past Surgical History:  Procedure Laterality Date   ABDOMINAL HYSTERECTOMY     ADENOIDECTOMY     ANGIOPLASTY     APPENDECTOMY     BIOPSY N/A 03/14/2013   Procedure: SMALL BOWEL AND GASTRIC BIOPSIES (Procedure #1);  Surgeon: West Bali, MD;  Location: [facility];  Service: Endoscopy;  Laterality: N/A;   CARDIAC CATHETERIZATION Left 02/11/2000   CATARACT EXTRACTION Left    CATARACT EXTRACTION W/PHACO Right 03/19/2021   Procedure: CATARACT EXTRACTION PHACO AND INTRAOCULAR LENS PLACEMENT (IOC) RIGHT 6.75 00:50.4;  Surgeon: Lockie Mola, MD;  Location: [facility];  Service: Ophthalmology;  Laterality: Right;   CHOLECYSTECTOMY  12/2007   COLONOSCOPY  12/22/2010   WUJ:WJXBJY, cecal adenomatous polyp   COLONOSCOPY WITH PROPOFOL N/A 03/31/2016   Dr. Darrick Penna: four sessile polyps rectum/sigmoid colon, diverticulosi, ext/int hemorrhoids. hyperplastic polyps, next tcs 5 years.   COLONOSCOPY WITH PROPOFOL N/A 04/21/2021   anal fissure, non-bleeding internal hemorrhoids, right colon diverticulosis, one 2 mm polyp in ascending, three 4-6 mm polyps in descending colon. Tubular adenomas. 5 year surveillance.   complete hysterectomy     ENTEROSCOPY N/A 03/14/2013   NWG:NFAO gastritis/ulcers has healed   ESOPHAGOGASTRODUODENOSCOPY  09/22/2011   ZHY:QMVH gastritis/Duodenitis   EXCISIONAL HEMORRHOIDECTOMY     FLEXIBLE SIGMOIDOSCOPY N/A 03/14/2013   SLF:3 colon polyp removed/moderate sized internal hemorrhoids   FLEXIBLE SIGMOIDOSCOPY N/A 06/09/2016   Procedure: FLEXIBLE SIGMOIDOSCOPY;  Surgeon: West Bali, MD;  Location: [facility];  Service: Endoscopy;  Laterality: N/A;   rectal polyps times 2   FLEXIBLE SIGMOIDOSCOPY N/A 03/18/2018   Procedure: FLEXIBLE SIGMOIDOSCOPY;  Surgeon: West Bali, MD;  Location: [facility];  Service: Endoscopy;  Laterality: N/A;  9:15am   FOOT SURGERY     bunion removal left   GIVENS CAPSULE STUDY N/A 02/10/2013   Procedure: GIVENS CAPSULE STUDY;  Surgeon: West Bali, MD;  Location: [facility];  Service: Endoscopy;  Laterality: N/A;  730   HEEL SPUR EXCISION     right    HEMORRHOID BANDING N/A 03/14/2013   Procedure: HEMORRHOID BANDING (Procedure #3)  3 bands applied QIO#[id number] Exp 02/02/2014 ;  Surgeon: West Bali, MD;  Location: [facility];  Service: Endoscopy;  Laterality: N/A;   HEMORRHOID SURGERY N/A 04/24/2016   Procedure: EXTENSIVE HEMORRHOIDECTOMY;  Surgeon: Ancil Linsey, MD;  Location: [facility];  Service: General;  Laterality: N/A;   LAPAROSCOPY     adhesions   POLYPECTOMY N/A 03/14/2013   XLK:GMWN Gastritis . ULCERS SEEN ON MAY 6 HAVE HEALED   POLYPECTOMY  03/31/2016   Procedure: POLYPECTOMY;  Surgeon: West Bali, MD;  Location: [facility];  Service: Endoscopy;;  sigmoid colon polyps x2, rectal polyps x2   POLYPECTOMY  04/21/2021   Procedure: POLYPECTOMY;  Surgeon: Lanelle Bal, DO;  Location: [facility];  Service: Endoscopy;;   RECTAL EXAM UNDER ANESTHESIA N/A 07/23/2021   Procedure: RECTAL EXAM UNDER ANESTHESIA;  Surgeon: Romie Levee, MD;  Location: [facility];  Service: General;  Laterality: N/A;   SPHINCTEROTOMY N/A 07/23/2021   Procedure:  CHEMICAL SPHINCTEROTOMY;  Surgeon: Romie Levee, MD;  Location: [facility];  Service: General;  Laterality: N/A;   TONSILLECTOMY     TUBAL LIGATION       reports that she quit smoking about 27 years ago. Her smoking use included cigarettes. She started smoking about 57 years ago. She has a 15 pack-year smoking history. She has never used smokeless tobacco. She reports that she does not currently use alcohol  after a past usage of about 4.0 standard drinks of alcohol per week. She reports that she does not use drugs.   Family History  Problem Relation Age of Onset   Colon cancer Mother        98s   Urticaria Mother    Heart disease Mother    Colon polyps Mother    Cancer Father        oral   Crohn's disease Sister    Cancer Sister        around heart   Colon polyps Sister    Diabetes Brother    Other Brother        intestinal sugery   Colon polyps Brother    Colon cancer Maternal Grandmother    Colon cancer Maternal Grandfather    Colon cancer Paternal Grandfather    Anesthesia problems Neg Hx     Physical Exam:   Vitals:   11/04/23 1130 11/04/23 1200 11/04/23 1230 11/04/23 1330  BP: 135/70 120/60 121/60 (!) 120/93  Pulse: 84 83 88 93  Resp:  20  (!) 26  Temp:      TempSrc:      SpO2: 95% 95% 96% 92%  Weight:      Height:       Constitutional: NAD, calm, comfortable Eyes: PERRL, lids and conjunctivae normal ENMT: Mucous membranes are moist. Posterior pharynx clear of any exudate or lesions.Normal dentition.  Neck: normal, supple, no masses, no thyromegaly Respiratory: clear to auscultation bilaterally, no wheezing, no crackles. Normal respiratory effort. No accessory muscle use.  Cardiovascular: Regular rate and rhythm, no murmurs / rubs / gallops. No extremity edema. 2+ pedal pulses. No carotid bruits.  Abdomen: no tenderness, no masses palpated. No hepatosplenomegaly. Bowel sounds positive.  Musculoskeletal: no clubbing / cyanosis. No joint deformity upper and lower extremities. Good ROM, no contractures. Normal muscle tone.  Neurologic: CN II-XII grossly intact. Sensation intact, DTR normal. Strength 5/5 in all 4.  Psychiatric: Normal judgment and insight. Alert and oriented x 3. Normal mood.  Skin: no rashes, lesions, ulcers. No induration     Labs on admission:    I have personally reviewed following labs and imaging studies  CBC: Recent Labs  Lab  11/04/23 0955  WBC 6.5  NEUTROABS 4.0  HGB 12.3  HCT 37.6  MCV 98.9  PLT 216   Basic Metabolic Panel: Recent Labs  Lab 11/03/23 1246 11/04/23 0955  NA 136 134*  K 3.7 3.9  CL 103 106  CO2 21* 19*  GLUCOSE 120* 110*  BUN 15 17  CREATININE 1.31* 1.16*  CALCIUM 9.7 8.9  MG 1.5* 1.5*  PHOS  --  2.9   GFR: Estimated Creatinine Clearance: 51.8 mL/min (A) (by C-G formula based on SCr of 1.16 mg/dL (H)). Liver Function Tests: Recent Labs  Lab 11/03/23 1246  AST 29  ALT 17  ALKPHOS 29*  BILITOT 1.8*  PROT 7.1  ALBUMIN 3.8    Recent Labs    11/03/23 1246  CHOL 176  HDL 22*  LDLCALC  119*  TRIG 173*  CHOLHDL 8.0   Thyroid Function Tests: Recent Labs    11/03/23 1246  TSH 1.393  FREET4 1.20*   Anemia Panel: No results for input(s): "VITAMINB12", "FOLATE", "FERRITIN", "TIBC", "IRON", "RETICCTPCT" in the last 72 hours. Urine analysis:    Component Value Date/Time   COLORURINE YELLOW 03/17/2017 0845   APPEARANCEUR CLEAR 03/17/2017 0845   LABSPEC 1.015 03/17/2017 0845   PHURINE 5.0 03/17/2017 0845   GLUCOSEU NEGATIVE 03/17/2017 0845   HGBUR NEGATIVE 03/17/2017 0845   BILIRUBINUR negative 10/29/2022 1116   KETONESUR negative 10/29/2022 1116   KETONESUR NEGATIVE 03/17/2017 0845   PROTEINUR NEGATIVE 03/17/2017 0845   UROBILINOGEN 0.2 10/29/2022 1116   UROBILINOGEN 0.2 07/28/2015 0717   NITRITE Negative 10/29/2022 1116   NITRITE NEGATIVE 03/17/2017 0845   LEUKOCYTESUR Trace (A) 10/29/2022 1116    Last A1C:  Lab Results  Component Value Date   HGBA1C 5.6 02/01/2023     Radiologic Exams on Admission:   ECHOCARDIOGRAM COMPLETE Result Date: 11/04/2023    ECHOCARDIOGRAM REPORT   Patient Name:   DONOVAN GATCHEL Date of Exam: 11/04/2023 Medical Rec #:  [id number]        Height:       67.0 in Accession #:    [id number]       Weight:       203.2 lb Date of Birth:  1951-09-25         BSA:          2.036 m Patient Age:    73 years         BP:           120/60 mmHg  Patient Gender: F                HR:           86 bpm. [facility] Procedure: 2D Echo, Cardiac Doppler and Color Doppler STAT ECHO Indications:    Pulmonary Embolus I26.09  History:        Patient has prior history of Echocardiogram examinations, most                 recent 05/21/2022. Risk Factors:Former Smoker, Dyslipidemia and                 Hypertension. Morbidly obese, Hx pulmonary embolism.  Sonographer:    Celesta Gentile RCS Referring Phys: [id number] MICHAEL C BUTLER IMPRESSIONS  1. Left ventricular ejection fraction, by estimation, is 70 to 75%. The left ventricle has hyperdynamic function. The left ventricle has no regional wall motion abnormalities. There is mild left ventricular hypertrophy. Left ventricular diastolic parameters are consistent with Grade I diastolic dysfunction (impaired relaxation).  2. Right ventricular systolic function is moderately reduced. The right ventricular size is moderately enlarged. Tricuspid regurgitation signal is inadequate for assessing PA pressure.  3. A small pericardial effusion is present. The pericardial effusion is circumferential.  4. The mitral valve is normal in structure. No evidence of mitral valve regurgitation. No evidence of mitral stenosis.  5. The aortic valve is tricuspid. Aortic valve regurgitation is not visualized. No aortic stenosis is present.  6. The inferior vena cava is normal in size with greater than 50% respiratory variability, suggesting right atrial pressure of 3 mmHg. FINDINGS  Left Ventricle: Left ventricular ejection fraction, by estimation, is 70 to 75%. The left ventricle has hyperdynamic function. The left ventricle has no regional wall motion abnormalities. The left ventricular internal cavity size was  normal in size. There is mild left ventricular hypertrophy. Left ventricular diastolic parameters are consistent with Grade I diastolic dysfunction (impaired relaxation). Normal left ventricular filling pressure. Right  Ventricle: The right ventricular size is moderately enlarged. Right vetricular wall thickness was not well visualized. Right ventricular systolic function is moderately reduced. Tricuspid regurgitation signal is inadequate for assessing PA pressure. Left Atrium: Left atrial size was normal in size. Right Atrium: Right atrial size was not well visualized. Pericardium: A small pericardial effusion is present. The pericardial effusion is circumferential. Mitral Valve: The mitral valve is normal in structure. No evidence of mitral valve regurgitation. No evidence of mitral valve stenosis. Tricuspid Valve: The tricuspid valve is not well visualized. Tricuspid valve regurgitation is not demonstrated. No evidence of tricuspid stenosis. Aortic Valve: The aortic valve is tricuspid. Aortic valve regurgitation is not visualized. No aortic stenosis is present. Aortic valve mean gradient measures 3.9 mmHg. Aortic valve peak gradient measures 8.3 mmHg. Aortic valve area, by VTI measures 2.21 cm. Pulmonic Valve: The pulmonic valve was not well visualized. Pulmonic valve regurgitation is not visualized. No evidence of pulmonic stenosis. Aorta: The aortic root is normal in size and structure. Venous: The inferior vena cava is normal in size with greater than 50% respiratory variability, suggesting right atrial pressure of 3 mmHg. IAS/Shunts: No atrial level shunt detected by color flow Doppler.  LEFT VENTRICLE PLAX 2D LVIDd:         4.10 cm   Diastology LVIDs:         2.50 cm   LV e' medial:    6.20 cm/s LV PW:         1.10 cm   LV E/e' medial:  10.2 LV IVS:        1.10 cm   LV e' lateral:   7.51 cm/s LVOT diam:     1.90 cm   LV E/e' lateral: 8.4 LV SV:         56 LV SV Index:   28 LVOT Area:     2.84 cm  RIGHT VENTRICLE RV S prime:     12.80 cm/s TAPSE (M-mode): 2.2 cm LEFT ATRIUM             Index        RIGHT ATRIUM           Index LA diam:        4.00 cm 1.96 cm/m   RA Area:     24.00 cm LA Vol (A2C):   42.3 ml 20.78 ml/m   RA Volume:   88.50 ml  43.47 ml/m LA Vol (A4C):   50.2 ml 24.66 ml/m LA Biplane Vol: 49.7 ml 24.41 ml/m  AORTIC VALVE AV Area (Vmax):    2.23 cm AV Area (Vmean):   2.00 cm AV Area (VTI):     2.21 cm AV Vmax:           143.81 cm/s AV Vmean:          90.609 cm/s AV VTI:            0.255 m AV Peak Grad:      8.3 mmHg AV Mean Grad:      3.9 mmHg LVOT Vmax:         113.00 cm/s LVOT Vmean:        63.800 cm/s LVOT VTI:          0.199 m LVOT/AV VTI ratio: 0.78  AORTA Ao Root diam: 3.30 cm MITRAL VALVE  MV Area (PHT): 2.76 cm     SHUNTS MV Decel Time: 275 msec     Systemic VTI:  0.20 m MV E velocity: 63.40 cm/s   Systemic Diam: 1.90 cm MV A velocity: 100.00 cm/s MV E/A ratio:  0.63 Dina Rich MD Electronically signed by Dina Rich MD Signature Date/Time: 11/04/2023/1:11:43 PM    Final    CT Angio Chest PE W/Cm &/Or Wo Cm Result Date: 11/04/2023 CLINICAL DATA:  Pulmonary embolism (PE) suspected, high prob. Respiratory distress, shortness of breath EXAM: CT ANGIOGRAPHY CHEST WITH CONTRAST TECHNIQUE: Multidetector CT imaging of the chest was performed using the standard protocol during bolus administration of intravenous contrast. Multiplanar CT image reconstructions and MIPs were obtained to evaluate the vascular anatomy. RADIATION DOSE REDUCTION: This exam was performed according to the departmental dose-optimization program which includes automated exposure control, adjustment of the mA and/or kV according to patient size and/or use of iterative reconstruction technique. CONTRAST:  75mL OMNIPAQUE IOHEXOL 350 MG/ML SOLN COMPARISON:  Chest x-ray today.  CT 07/26/2015 FINDINGS: Cardiovascular: Numerous bilateral filling defects within the pulmonary arteries compatible with pulmonary emboli. Evidence of right heart strain with elevated RV/LV ratio of 1.45. Heart is borderline in size. Small pericardial effusion. Aorta normal caliber. Mediastinum/Nodes: No mediastinal, hilar, or axillary adenopathy. Trachea and  esophagus are unremarkable. Thyroid unremarkable. Lungs/Pleura: No confluent opacities or effusions. Upper Abdomen: No acute findings Musculoskeletal: Chest wall soft tissues are unremarkable. No acute bony abnormality. Review of the MIP images confirms the above findings. IMPRESSION: Multiple bilateral pulmonary emboli. CT evidence of right heart strain (RV/LV Ratio = 1.45) consistent with at least submassive (intermediate risk) PE. The presence of right heart strain has been associated with an increased risk of morbidity and mortality. Please refer to the "Code PE Focused" order set in EPIC. Borderline heart size.  Small pericardial effusion. These results were called by telephone at the time of interpretation on 11/04/2023 at 11:28 am to provider [facility] , who verbally acknowledged these results. Electronically Signed   By: Charlett Nose M.D.   On: 11/04/2023 11:29   DG Chest Port 1 View Result Date: 11/04/2023 CLINICAL DATA:  Shortness of breath. EXAM: PORTABLE CHEST 1 VIEW COMPARISON:  05/12/2022. FINDINGS: Bilateral lung fields are clear. Bilateral costophrenic angles are clear. Normal cardio-mediastinal silhouette. No acute osseous abnormalities. The soft tissues are within normal limits. IMPRESSION: No active disease. Electronically Signed   By: Jules Schick M.D.   On: 11/04/2023 10:42    EKG:   Independently reviewed.  Orders placed or performed during the hospital encounter of 11/04/23   ED EKG   ED EKG   EKG 12-Lead   EKG 12-Lead   EKG 12-Lead   *Note: Due to a large number of results and/or encounters for the requested time period, some results have not been displayed. A complete set of results can be found in Results Review.   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   Hypothyroidism   Asthma   Chronic insomnia   GERD (gastroesophageal reflux disease)   Irritable bowel syndrome   Essential hypertension, benign   Morbid obesity (HCC)   Hyperlipidemia   Nonalcoholic steatohepatitis (NASH)   Primary open angle glaucoma (POAG) of both eyes   Spinal stenosis at L4-L5 level   Assessment and Plan: *  Recurrent pulmonary embolism (HCC) -Hemodynamically stable, denies any chest pain, satting 96% on room air, blood pressure stable -Continue heparin drip -CTA reviewed, revealing bilateral multiple recurrent pulmonary embolism-with possible right heart strain  -Consulted pulmonologist Dr. Michae Kava  Discussed the case in detail does not recommend transfer or thrombectomy at this point. 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 - Stable follow-up with PCP as an outpatient   DDD (degenerative disc disease), lumbosacral - Stable will monitor her home narcotics -Will be mindful analgesics as patient will be on heparin drip high risk of bleeding  Depression - Mood stable, continue home medication -Currently not on any SSRIs   Anxiety - Home medication reviewed will be continued accordingly currently patient is on 1 mg Xanax twice daily as needed  GERD (gastroesophageal reflux disease) - Continue PPI  Chronic insomnia - Stable continue home medication of Ambien  Asthma -No signs of exacerbation, will continue to monitor, continue inhalers supplemental oxygen as needed  Spinal stenosis at L4-L5 level - History of back pain  including spinal stenosis -Currently stable -Continue home dose analgesics with as needed IV meds  Primary open angle glaucoma (POAG) of both eyes - Stable continue home meds  Nonalcoholic steatohepatitis (NASH) - Holding any hepatotoxic medications (including statins) Monitoring LFTs  Hyperlipidemia - Patient not on statins, will continue home medication of Zetia, fenofibrate  Morbid obesity (HCC) Body mass index is 31.83 kg/m. -Discussed regarding healthy diet, exercise  Essential hypertension, benign - BP stable-current home medications reviewed no BP meds on board -we will verify -As needed hydralazine for now   Irritable bowel syndrome - Reviewing home medication will resume accordingly Currently stable      Consults called: PCCM Dr. Michae Kava at [facility] -------------------------------------------------------------------------------------------------------------------------------------------- DVT prophylaxis:  TED hose Start: 11/04/23 1253 SCDs Start: 11/04/23 1253   Code Status:   Code Status: Full Code   Admission status: Patient will be admitted as Inpatient, with a greater than 2 midnight length of stay. Level of care: ICU   Family Communication:  none at bedside  (The above findings and plan of care has been discussed with patient in detail, the patient expressed understanding and agreement of above plan)  --------------------------------------------------------------------------------------------------------------------------------------------------  Disposition Plan:  Anticipated 1-2 days Status is: Inpatient Remains inpatient appropriate because: Needing heparin drip, close observation, and evaluation for acute multiple bilateral pulmonary embolism     ----------------------------------------------------------------------------------------------------------------------------------------------------  Critical care time spent:  24  Min.  Was spent seeing  and evaluating the patient, reviewing all medical records, drawn plan of care.  SIGNED: Kendell Bane, MD, FHM. FAAFP. [place] [facility], Pager  (Please use [email or web address] to page/ or secure chat through epic) If 7PM-7AM, please contact night-coverage [email or web address],  11/04/2023, 2:52 PM

## 2023-11-04 NOTE — Assessment & Plan Note (Signed)
-   Stable continue home medication of Ambien

## 2023-11-04 NOTE — Assessment & Plan Note (Signed)
-   Reviewing home medication will resume accordingly Currently stable

## 2023-11-04 NOTE — Assessment & Plan Note (Signed)
-   Holding any hepatotoxic medications (including statins) Monitoring LFTs

## 2023-11-04 NOTE — Assessment & Plan Note (Signed)
Body mass index is 31.83 kg/m. -Discussed regarding healthy diet, exercise

## 2023-11-04 NOTE — Telephone Encounter (Signed)
Patient Product/process development scientist completed.    The patient is insured through [employer].     Ran test claim for Eliquis 5 mg and the current 30 day co-pay is $109.37.   This test claim was processed through [facility]- copay amounts may vary at other pharmacies due to pharmacy/plan contracts, or as the patient moves through the different stages of their insurance plan.     Roland Earl, CPHT Pharmacy Technician III Certified Patient Advocate [facility] Patient Advocate Team Direct Number: [phone]  Fax: [phone]

## 2023-11-04 NOTE — Assessment & Plan Note (Signed)
Stable; continue home meds

## 2023-11-04 NOTE — Assessment & Plan Note (Signed)
-   Stable follow-up with PCP as an outpatient

## 2023-11-04 NOTE — Patient Instructions (Addendum)
Medication Instructions:  Your physician recommends that you continue on your current medications as directed. Please refer to the Current Medication list given to you today.  Labwork: None   Testing/Procedures: None   Follow-Up: Your physician recommends that you schedule a follow-up appointment in: Sent to [facility] ED by EMS  1-2 weeks after d/c  Any Other Special Instructions Will Be Listed Below (If Applicable).  If you need a refill on your cardiac medications before your next appointment, please call your pharmacy.

## 2023-11-04 NOTE — Assessment & Plan Note (Signed)
-   Mood stable, continue home medication -Currently not on any SSRIs
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
## 2023-11-04 NOTE — Assessment & Plan Note (Addendum)
-  Hemodynamically stable, denies any chest pain, satting 96% on room air, blood pressure stable -Continue heparin drip -CTA reviewed, revealing bilateral multiple recurrent pulmonary embolism-with possible right heart strain  -Consulted pulmonologist Dr. Michae Kava  Discussed the case in detail does not recommend transfer or thrombectomy at this point. Reviewed echo as below Recommended to continue heparin and transition to DOAC in 24 hours  - 2D echocardiogram: ED JF 70-75%, mild LVH, grade 1 diastolic dysfunction, right ventricle systolic function is moderately reduced, right ventricular size moderately enlarged, small pericardial effusion, valves appear normal

## 2023-11-04 NOTE — Plan of Care (Signed)

## 2023-11-04 NOTE — ED Provider Notes (Signed)
[place] [facility]ENC[facility] DEPARTMENT AT [facility] Provider Note   CSN: [id number] Arrival date & time: 11/04/23  7829     History  Chief Complaint  Patient presents with   Shortness of Breath    [name] is a 73 y.o. female.  She is brought in by ambulance from her doctor's office for evaluation of shortness of breath.  She said she has been short of breath for a month, dyspnea on exertion, cough productive of some yellow sputum, intermittent fevers.  Has some tightness in her chest with exertion and cough.  History of PE, not on anticoagulation.  Had an episode of syncope a week ago, unclear if was related to shortness of breath.  Saw cardiology today who referred her here for further evaluation of her shortness of breath.  The history is provided by the patient.  Shortness of Breath Severity:  Moderate Onset quality:  Gradual Duration:  1 month Timing:  Intermittent Progression:  Worsening Relieved by:  Nothing Worsened by:  Activity Ineffective treatments:  Rest Associated symptoms: chest pain, cough, fever and sputum production   Associated symptoms: no abdominal pain, no hemoptysis, no vomiting and no wheezing        Home Medications Prior to Admission medications   Medication Sig Start Date End Date Taking? Authorizing Provider  albuterol (PROVENTIL) (2.5 MG/3ML) 0.083% nebulizer solution Take 3 mLs (2.5 mg total) by nebulization every 4 (four) hours as needed for wheezing or shortness of breath. 03/29/23   Anabel Halon, MD  albuterol (VENTOLIN HFA) 108 (90 Base) MCG/ACT inhaler Inhale 2 puffs into the lungs every 4 (four) hours as needed for wheezing. 10/25/23   Anabel Halon, MD  ALPRAZolam Prudy Feeler) 1 MG tablet Take 1 tablet (1 mg total) by mouth 2 (two) times daily as needed for anxiety. Patient taking differently: Take 0.5-1 mg by mouth at bedtime. 11/25/20   Salley Scarlet, MD  brimonidine (ALPHAGAN) 0.2 % ophthalmic solution Place 1 drop into  both eyes 2 (two) times daily. 11/25/20   Salley Scarlet, MD  chlorpheniramine-HYDROcodone (TUSSIONEX) 10-8 MG/5ML Take 5 mLs by mouth every 12 (twelve) hours as needed for cough. 10/04/23   Anabel Halon, MD  Cholecalciferol (VITAMIN D3) 50 MCG (2000 UT) TABS Take 2,000 Units by mouth daily.    [provider]  dexlansoprazole (DEXILANT) 60 MG capsule Take 1 capsule (60 mg total) by mouth daily. 08/10/23   Imogene Burn, MD  diclofenac Sodium (VOLTAREN) 1 % GEL Apply 4 g topically 4 (four) times daily. 10/01/22   Anabel Halon, MD  DUREZOL 0.05 % EMUL Place 1 drop into the right eye daily. 02/17/21   [provider]  estradiol (ESTRACE VAGINAL) 0.1 MG/GM vaginal cream Place 1 Applicatorful vaginally daily as needed. 01/28/22   Anabel Halon, MD  ezetimibe (ZETIA) 10 MG tablet Take 1 tablet (10 mg total) by mouth daily. 06/04/23 09/02/23  Jonelle Sidle, MD  fenofibrate (TRICOR) 145 MG tablet Take 1 tablet (145 mg total) by mouth daily. 04/21/23   Anabel Halon, MD  Fluticasone-Umeclidin-Vilant (TRELEGY ELLIPTA) 100-62.5-25 MCG/ACT AEPB Inhale 1 puff into the lungs daily. 10/04/23   Anabel Halon, MD  HYDROcodone-acetaminophen (NORCO/VICODIN) 5-325 MG tablet Take 1 tablet by mouth every 8 (eight) hours as needed for moderate pain (pain score 4-6). 10/04/23   Anabel Halon, MD  hyoscyamine (ANASPAZ) 0.125 MG TBDP disintergrating tablet Place 1 tablet (0.125 mg total) under the tongue  daily as needed (IBS). Place 0.125 mg under the tongue daily as needed (IBS). 09/03/21   Anabel Halon, MD  irbesartan (AVAPRO) 150 MG tablet Take 0.5 tablets (75 mg total) by mouth daily. 10/04/23   Anabel Halon, MD  latanoprost (XALATAN) 0.005 % ophthalmic solution Place 1 drop into both eyes at bedtime. 11/25/20   Salley Scarlet, MD  levothyroxine (SYNTHROID) 25 MCG tablet Take 1 tablet (25 mcg total) by mouth daily. 05/05/23   Roma Kayser, MD  Lidocaine-Hydrocortisone Ace  3-2.5 % KIT APPLY TO RECTUM QID for 2 weeks then AS NEEDED FOR RECTAL PAIN OR BLEEDING 04/21/21   Marletta Lor, Hennie Duos, DO  Lifitegrast (XIIDRA) 5 % SOLN INSTILL 1 DROP IN BOTH EYES EVERY DAY AS NEEDED (EACH CONTAINER SHOULD YIELD 2 DROPS) STORE IN ORIGINAL FOIL POUCH Patient taking differently: Place 1 drop into both eyes daily. [facility] CONTAINER SHOULD YIELD 2 DROPS) STORE IN ORIGINAL FOIL POUCH 11/25/20   Salley Scarlet, MD  Magnesium 500 MG TABS Take 1 tablet by mouth 2 (two) times daily with a meal.    [provider]  meclizine (ANTIVERT) 12.5 MG tablet Take 1 tablet (12.5 mg total) by mouth 3 (three) times daily as needed for dizziness. 10/04/23   Anabel Halon, MD  Menthol, Topical Analgesic, (BIOFREEZE EX) Apply 1 application topically daily as needed (pain).    [provider]  metroNIDAZOLE (METROCREAM) 0.75 % cream Apply 1 application. topically as needed. 01/29/22   Anabel Halon, MD  naproxen (NAPROSYN) 500 MG tablet Take 1 tablet (500 mg total) by mouth 2 (two) times daily with a meal. 10/06/22   Darreld Mclean, MD  neomycin-polymyxin b-dexamethasone (MAXITROL) 3.5-10000-0.1 OINT Place 1 application into both eyes at bedtime as needed. Patient taking differently: Place 1 application  into both eyes at bedtime as needed (Dry eyes). 01/12/20   Reedley, Velna Hatchet, MD  nitroGLYCERIN (NITROLINGUAL) 0.4 MG/SPRAY spray Place 1 spray under the tongue every 5 (five) minutes as needed for chest pain. 10/04/23   Anabel Halon, MD  NON FORMULARY Take 1 tablet by mouth daily as needed (Stomach Bloat). FD GUARD    [provider]  ondansetron (ZOFRAN) 4 MG tablet TAKE ONE TABLET BY MOUTH EVERY 8 HOURS AS NEEDED FOR NAUSEA OR VOMITING 09/23/23   Anabel Halon, MD  prednisoLONE acetate (PRED FORTE) 1 % ophthalmic suspension Place 1 drop into the right eye 3 (three) times daily. 11/05/21   [provider]  Probiotic Product (PROBIOTIC DAILY PO) Take 1 capsule by mouth  daily. VSL #3    [provider]  Rimegepant Sulfate (NURTEC) 75 MG TBDP Take 1 tablet by mouth as needed.    [provider]  rizatriptan (MAXALT-MLT) 10 MG disintegrating tablet Take 1 tablet (10 mg total) by mouth as needed for migraine. May repeat in 2 hours if needed. (ok to send out this medication to the patient, the patient can tolerate this medication and will be monitored, if this is not acceptable please cancel this script and send in a new erx with a note, thanks) 10/21/22   Windell Norfolk, MD  UNABLE TO FIND Place 1 application rectally daily as needed. [facility]  Med Name: lidocaine 5% pramoxine 1% ointment per rectum as needed.    [provider]  zolpidem (AMBIEN) 10 MG tablet Take 1 tablet (10 mg total) by mouth at bedtime as needed for sleep. 07/22/23   Anabel Halon, MD  Allergies    Nutmeg oil (myristica oil), Adhesive [tape], Aspirin, Imitrex [sumatriptan], Keflex [cephalexin], Levaquin [levofloxacin in d5w], Other, Statins, Sulfa antibiotics, Sulfonamide derivatives, and Latex    Review of Systems   Review of Systems  Constitutional:  Positive for fever.  Eyes:  Positive for visual disturbance.  Respiratory:  Positive for cough, sputum production and shortness of breath. Negative for hemoptysis and wheezing.   Cardiovascular:  Positive for chest pain.  Gastrointestinal:  Positive for nausea. Negative for abdominal pain and vomiting.    Physical Exam Updated Vital Signs BP (!) 138/105   Pulse 99   Temp 98 F (36.7 C) (Oral)   Resp 17   Ht 5\' 7"  (1.702 m)   Wt 92.2 kg   SpO2 90%   BMI 31.83 kg/m  Physical Exam Vitals and nursing note reviewed.  Constitutional:      General: She is not in acute distress.    Appearance: She is well-developed.  HENT:     Head: Normocephalic and atraumatic.  Eyes:     Conjunctiva/sclera: Conjunctivae normal.  Cardiovascular:     Rate and Rhythm: Normal rate and regular rhythm.      Heart sounds: No murmur heard. Pulmonary:     Effort: Pulmonary effort is normal. No respiratory distress.     Breath sounds: Normal breath sounds.  Abdominal:     Palpations: Abdomen is soft.     Tenderness: There is no abdominal tenderness.  Musculoskeletal:        General: No swelling.     Cervical back: Neck supple.     Right lower leg: No tenderness. No edema.     Left lower leg: No tenderness. No edema.  Skin:    General: Skin is warm and dry.     Capillary Refill: Capillary refill takes less than 2 seconds.  Neurological:     General: No focal deficit present.     Mental Status: She is alert.     ED Results / Procedures / Treatments   Labs (all labs ordered are listed, but only abnormal results are displayed) Labs Reviewed  BASIC METABOLIC PANEL - Abnormal; Notable for the following components:      Result Value   Sodium 134 (*)    CO2 19 (*)    Glucose, Bld 110 (*)    Creatinine, Ser 1.16 (*)    GFR, Estimated 50 (*)    All other components within normal limits  BRAIN NATRIURETIC PEPTIDE - Abnormal; Notable for the following components:   B Natriuretic Peptide 120.0 (*)    All other components within normal limits  CBC WITH DIFFERENTIAL/PLATELET - Abnormal; Notable for the following components:   RBC 3.80 (*)    All other components within normal limits  D-DIMER, QUANTITATIVE - Abnormal; Notable for the following components:   D-Dimer, Quant 2.19 (*)    All other components within normal limits  MAGNESIUM - Abnormal; Notable for the following components:   Magnesium 1.5 (*)    All other components within normal limits  RESP PANEL BY RT-PCR (RSV, FLU A&B, COVID)  RVPGX2  EXPECTORATED SPUTUM ASSESSMENT W GRAM STAIN, RFLX TO RESP C  MRSA NEXT GEN BY PCR, NASAL  PHOSPHORUS  HEPARIN LEVEL (UNFRACTIONATED)  CBC  HEPARIN LEVEL (UNFRACTIONATED)  BASIC METABOLIC PANEL  PROTIME-INR  APTT  TROPONIN I (HIGH SENSITIVITY)  TROPONIN I (HIGH SENSITIVITY)    EKG EKG  Interpretation Date/Time:  Thursday November 04 2023 09:47:06 EST Ventricular Rate:  82 PR  Interval:  158 QRS Duration:  97 QT Interval:  446 QTC Calculation: 521 R Axis:   70  Text Interpretation: Sinus rhythm Low voltage, precordial leads Nonspecific T abnormalities, diffuse leads Prolonged QT interval No significant change since prior 8/24 Confirmed by Meridee Score [phone]) on 11/04/2023 10:02:24 AM  Radiology US Venous Img Lower Bilateral (DVT) Result Date: 11/04/2023 CLINICAL DATA:  73 year old female with pulmonary embolism and lower extremity swelling EXAM: BILATERAL LOWER EXTREMITY VENOUS DOPPLER ULTRASOUND TECHNIQUE: Gray-scale sonography with graded compression, as well as color Doppler and duplex ultrasound were performed to evaluate the lower extremity deep venous systems from the level of the common femoral vein and including the common femoral, femoral, profunda femoral, popliteal and calf veins including the posterior tibial, peroneal and gastrocnemius veins when visible. The superficial great saphenous vein was also interrogated. Spectral Doppler was utilized to evaluate flow at rest and with distal augmentation maneuvers in the common femoral, femoral and popliteal veins. COMPARISON:  None Available. FINDINGS: RIGHT LOWER EXTREMITY Common Femoral Vein: No evidence of thrombus. Normal compressibility, respiratory phasicity and response to augmentation. Saphenofemoral Junction: No evidence of thrombus. Normal compressibility and flow on color Doppler imaging. Profunda Femoral Vein: No evidence of thrombus. Normal compressibility and flow on color Doppler imaging. Femoral Vein: No evidence of thrombus. Normal compressibility, respiratory phasicity and response to augmentation. Popliteal Vein: No evidence of thrombus. Normal compressibility, respiratory phasicity and response to augmentation. Calf Veins: Noncompressible right gastrocs and soleal veins. Occlusive thrombus with no flow on the  duplex. Occlusive thrombus of the peroneal vein. Superficial Great Saphenous Vein: No evidence of thrombus. Normal compressibility and flow on color Doppler imaging. Other Findings:  None. LEFT LOWER EXTREMITY Common Femoral Vein: No evidence of thrombus. Normal compressibility, respiratory phasicity and response to augmentation. Saphenofemoral Junction: No evidence of thrombus. Normal compressibility and flow on color Doppler imaging. Profunda Femoral Vein: No evidence of thrombus. Normal compressibility and flow on color Doppler imaging. Femoral Vein: No evidence of thrombus. Normal compressibility, respiratory phasicity and response to augmentation. Popliteal Vein: No evidence of thrombus. Normal compressibility, respiratory phasicity and response to augmentation. Calf Veins: Occlusive thrombus of left peroneal vein. Superficial Great Saphenous Vein: No evidence of thrombus. Normal compressibility and flow on color Doppler imaging. Other Findings:  None. IMPRESSION: Directed duplex of the right lower extremity positive for distal DVT, including both muscular (gastrocs/soleal) veins and axial (peroneal) vein. Directed duplex of the left lower extremity positive for distal DVT, involving the peroneal vein. Signed, Yvone Neu. Miachel Roux, RPVI [facility] Electronically Signed   By: Gilmer Mor D.O.   On: 11/04/2023 16:12   ECHOCARDIOGRAM COMPLETE Result Date: 11/04/2023    ECHOCARDIOGRAM REPORT   Patient Name:   KIT BRUBACHER Date of Exam: 11/04/2023 Medical Rec #:  [id number]        Height:       67.0 in Accession #:    [id number]       Weight:       203.2 lb Date of Birth:  11-Nov-1950         BSA:          2.036 m Patient Age:    73 years         BP:           120/60 mmHg Patient Gender: F                HR:  86 bpm. [facility] Procedure: 2D Echo, Cardiac Doppler and Color Doppler STAT ECHO Indications:    Pulmonary  Embolus I26.09  History:        Patient has prior history of Echocardiogram examinations, most                 recent 05/21/2022. Risk Factors:Former Smoker, Dyslipidemia and                 Hypertension. Morbidly obese, Hx pulmonary embolism.  Sonographer:    Celesta Gentile RCS Referring Phys: [id number] Maguadalupe Lata C Chakira Jachim IMPRESSIONS  1. Left ventricular ejection fraction, by estimation, is 70 to 75%. The left ventricle has hyperdynamic function. The left ventricle has no regional wall motion abnormalities. There is mild left ventricular hypertrophy. Left ventricular diastolic parameters are consistent with Grade I diastolic dysfunction (impaired relaxation).  2. Right ventricular systolic function is moderately reduced. The right ventricular size is moderately enlarged. Tricuspid regurgitation signal is inadequate for assessing PA pressure.  3. A small pericardial effusion is present. The pericardial effusion is circumferential.  4. The mitral valve is normal in structure. No evidence of mitral valve regurgitation. No evidence of mitral stenosis.  5. The aortic valve is tricuspid. Aortic valve regurgitation is not visualized. No aortic stenosis is present.  6. The inferior vena cava is normal in size with greater than 50% respiratory variability, suggesting right atrial pressure of 3 mmHg. FINDINGS  Left Ventricle: Left ventricular ejection fraction, by estimation, is 70 to 75%. The left ventricle has hyperdynamic function. The left ventricle has no regional wall motion abnormalities. The left ventricular internal cavity size was normal in size. There is mild left ventricular hypertrophy. Left ventricular diastolic parameters are consistent with Grade I diastolic dysfunction (impaired relaxation). Normal left ventricular filling pressure. Right Ventricle: The right ventricular size is moderately enlarged. Right vetricular wall thickness was not well visualized. Right ventricular systolic function is moderately reduced.  Tricuspid regurgitation signal is inadequate for assessing PA pressure. Left Atrium: Left atrial size was normal in size. Right Atrium: Right atrial size was not well visualized. Pericardium: A small pericardial effusion is present. The pericardial effusion is circumferential. Mitral Valve: The mitral valve is normal in structure. No evidence of mitral valve regurgitation. No evidence of mitral valve stenosis. Tricuspid Valve: The tricuspid valve is not well visualized. Tricuspid valve regurgitation is not demonstrated. No evidence of tricuspid stenosis. Aortic Valve: The aortic valve is tricuspid. Aortic valve regurgitation is not visualized. No aortic stenosis is present. Aortic valve mean gradient measures 3.9 mmHg. Aortic valve peak gradient measures 8.3 mmHg. Aortic valve area, by VTI measures 2.21 cm. Pulmonic Valve: The pulmonic valve was not well visualized. Pulmonic valve regurgitation is not visualized. No evidence of pulmonic stenosis. Aorta: The aortic root is normal in size and structure. Venous: The inferior vena cava is normal in size with greater than 50% respiratory variability, suggesting right atrial pressure of 3 mmHg. IAS/Shunts: No atrial level shunt detected by color flow Doppler.  LEFT VENTRICLE PLAX 2D LVIDd:         4.10 cm   Diastology LVIDs:         2.50 cm   LV e' medial:    6.20 cm/s LV PW:         1.10 cm   LV E/e' medial:  10.2 LV IVS:        1.10 cm   LV e' lateral:   7.51 cm/s LVOT diam:  1.90 cm   LV E/e' lateral: 8.4 LV SV:         56 LV SV Index:   28 LVOT Area:     2.84 cm  RIGHT VENTRICLE RV S prime:     12.80 cm/s TAPSE (M-mode): 2.2 cm LEFT ATRIUM             Index        RIGHT ATRIUM           Index LA diam:        4.00 cm 1.96 cm/m   RA Area:     24.00 cm LA Vol (A2C):   42.3 ml 20.78 ml/m  RA Volume:   88.50 ml  43.47 ml/m LA Vol (A4C):   50.2 ml 24.66 ml/m LA Biplane Vol: 49.7 ml 24.41 ml/m  AORTIC VALVE AV Area (Vmax):    2.23 cm AV Area (Vmean):   2.00 cm  AV Area (VTI):     2.21 cm AV Vmax:           143.81 cm/s AV Vmean:          90.609 cm/s AV VTI:            0.255 m AV Peak Grad:      8.3 mmHg AV Mean Grad:      3.9 mmHg LVOT Vmax:         113.00 cm/s LVOT Vmean:        63.800 cm/s LVOT VTI:          0.199 m LVOT/AV VTI ratio: 0.78  AORTA Ao Root diam: 3.30 cm MITRAL VALVE MV Area (PHT): 2.76 cm     SHUNTS MV Decel Time: 275 msec     Systemic VTI:  0.20 m MV E velocity: 63.40 cm/s   Systemic Diam: 1.90 cm MV A velocity: 100.00 cm/s MV E/A ratio:  0.63 Dina Rich MD Electronically signed by Dina Rich MD Signature Date/Time: 11/04/2023/1:11:43 PM    Final    CT Angio Chest PE W/Cm &/Or Wo Cm Result Date: 11/04/2023 CLINICAL DATA:  Pulmonary embolism (PE) suspected, high prob. Respiratory distress, shortness of breath EXAM: CT ANGIOGRAPHY CHEST WITH CONTRAST TECHNIQUE: Multidetector CT imaging of the chest was performed using the standard protocol during bolus administration of intravenous contrast. Multiplanar CT image reconstructions and MIPs were obtained to evaluate the vascular anatomy. RADIATION DOSE REDUCTION: This exam was performed according to the departmental dose-optimization program which includes automated exposure control, adjustment of the mA and/or kV according to patient size and/or use of iterative reconstruction technique. CONTRAST:  75mL OMNIPAQUE IOHEXOL 350 MG/ML SOLN COMPARISON:  Chest x-ray today.  CT 07/26/2015 FINDINGS: Cardiovascular: Numerous bilateral filling defects within the pulmonary arteries compatible with pulmonary emboli. Evidence of right heart strain with elevated RV/LV ratio of 1.45. Heart is borderline in size. Small pericardial effusion. Aorta normal caliber. Mediastinum/Nodes: No mediastinal, hilar, or axillary adenopathy. Trachea and esophagus are unremarkable. Thyroid unremarkable. Lungs/Pleura: No confluent opacities or effusions. Upper Abdomen: No acute findings Musculoskeletal: Chest wall soft tissues  are unremarkable. No acute bony abnormality. Review of the MIP images confirms the above findings. IMPRESSION: Multiple bilateral pulmonary emboli. CT evidence of right heart strain (RV/LV Ratio = 1.45) consistent with at least submassive (intermediate risk) PE. The presence of right heart strain has been associated with an increased risk of morbidity and mortality. Please refer to the "Code PE Focused" order set in EPIC. Borderline heart size.  Small pericardial effusion. These  results were called by telephone at the time of interpretation on 11/04/2023 at 11:28 am to provider [facility] , who verbally acknowledged these results. Electronically Signed   By: Charlett Nose M.D.   On: 11/04/2023 11:29   DG Chest Port 1 View Result Date: 11/04/2023 CLINICAL DATA:  Shortness of breath. EXAM: PORTABLE CHEST 1 VIEW COMPARISON:  05/12/2022. FINDINGS: Bilateral lung fields are clear. Bilateral costophrenic angles are clear. Normal cardio-mediastinal silhouette. No acute osseous abnormalities. The soft tissues are within normal limits. IMPRESSION: No active disease. Electronically Signed   By: Jules Schick M.D.   On: 11/04/2023 10:42    Procedures .Critical Care  Performed by: Terrilee Files, MD Authorized by: Terrilee Files, MD   Critical care provider statement:    Critical care time (minutes):  45   Critical care time was exclusive of:  Separately billable procedures and treating other patients   Critical care was necessary to treat or prevent imminent or life-threatening deterioration of the following conditions:  Respiratory failure   Critical care was time spent personally by me on the following activities:  Development of treatment plan with patient or surrogate, discussions with consultants, evaluation of patient's response to treatment, examination of patient, obtaining history from patient or surrogate, ordering and performing treatments and interventions, ordering and review of laboratory  studies, ordering and review of radiographic studies, pulse oximetry, re-evaluation of patient's condition and review of old charts   I assumed direction of critical care for this patient from another provider in my specialty: no       Medications Ordered in ED Medications  heparin ADULT infusion 100 units/mL (25000 units/213mL) (1,400 Units/hr Intravenous New Bag/Given 11/04/23 1200)  sodium chloride flush (NS) 0.9 % injection 3 mL (has no administration in time range)  sodium chloride flush (NS) 0.9 % injection 3 mL (has no administration in time range)  acetaminophen (TYLENOL) tablet 650 mg (has no administration in time range)    Or  acetaminophen (TYLENOL) suppository 650 mg (has no administration in time range)  oxyCODONE (Oxy IR/ROXICODONE) immediate release tablet 5 mg (has no administration in time range)  HYDROmorphone (DILAUDID) injection 0.5-1 mg (has no administration in time range)  senna-docusate (Senokot-S) tablet 1 tablet (has no administration in time range)  bisacodyl (DULCOLAX) EC tablet 5 mg (has no administration in time range)  sodium phosphate (FLEET) enema 1 enema (has no administration in time range)  ondansetron (ZOFRAN) tablet 4 mg (has no administration in time range)    Or  ondansetron (ZOFRAN) injection 4 mg (has no administration in time range)  ipratropium (ATROVENT) nebulizer solution 0.5 mg (has no administration in time range)  levalbuterol (XOPENEX) nebulizer solution 0.63 mg (has no administration in time range)  hydrALAZINE (APRESOLINE) injection 10 mg (has no administration in time range)  Rimegepant Sulfate TBDP 75 mg (has no administration in time range)  rizatriptan (MAXALT-MLT) disintegrating tablet 10 mg (has no administration in time range)  ezetimibe (ZETIA) tablet 10 mg (10 mg Oral Not Given 11/04/23 1702)  fenofibrate tablet 160 mg (160 mg Oral Not Given 11/04/23 1702)  nitroGLYCERIN (NITROLINGUAL) 0.4 MG/SPRAY spray 1 spray (has no  administration in time range)  irbesartan (AVAPRO) tablet 75 mg (75 mg Oral Not Given 11/04/23 1702)  levothyroxine (SYNTHROID) tablet 25 mcg (25 mcg Oral Not Given 11/04/23 1702)  ALPRAZolam (XANAX) tablet 1 mg (has no administration in time range)  fluticasone furoate-vilanterol (BREO ELLIPTA) 100-25 MCG/ACT 1 puff (1 puff Inhalation Not Given  11/04/23 1426)    And  umeclidinium bromide (INCRUSE ELLIPTA) 62.5 MCG/ACT 1 puff (1 puff Inhalation Not Given 11/04/23 1426)  Lifitegrast 5 % SOLN 1 drop (1 drop Both Eyes Not Given 11/04/23 1701)  zolpidem (AMBIEN) tablet 5 mg (has no administration in time range)  Chlorhexidine Gluconate Cloth 2 % PADS 6 each (has no administration in time range)  iohexol (OMNIPAQUE) 350 MG/ML injection 75 mL (75 mLs Intravenous Contrast Given 11/04/23 1056)  heparin bolus via infusion 5,000 Units (5,000 Units Intravenous Bolus from Bag 11/04/23 1201)    ED Course/ Medical Decision Making/ A&P Clinical Course as of 11/04/23 1734  Thu Nov 04, 2023  1003 Chest x-ray without clear infiltrate.  Awaiting radiology reading. [MB]  1134 Received a call from radiology that patient has bilateral PE.  Patient said she was on Eliquis in 2017 for 6 months for her last PE. [MB]  1203 Discussed with critical care Dr. Michae Kava.  He reviewed the CT and does not think she is a thrombolytic candidate and doubts she is an embolectomy candidate as it sounds possibly not acute.  He is recommending getting a cardiac echo.  Feel she can stay at [facility] on standard anticoagulation.  He said to message him once the echo was done to help with further recommendations. [MB]  1252 Discussed with [facility] hospitalist Dr. Flossie Dibble who will evaluate patient for admission.  Patient bedside echo ongoing now. [MB]    Clinical Course User Index [MB] Terrilee Files, MD                                 Medical Decision Making Amount and/or Complexity of Data Reviewed Labs: ordered. Radiology:  ordered.  Risk Prescription drug management. Decision regarding hospitalization.   This patient complains of shortness of breath dyspnea on exertion; this involves an extensive number of treatment Options and is a complaint that carries with it a high risk of complications and morbidity. The differential includes pneumonia, CHF, COVID, flu, PE, pneumothorax, anemia  I ordered, reviewed and interpreted labs, which included CBC normal chemistries with low bicarb elevated creatinine, magnesium low, troponins flat, COVID flu negative, BNP mildly elevated I ordered medication IV heparin and reviewed PMP when indicated. I ordered imaging studies which included chest x-ray and CT angio and I independently    visualized and interpreted imaging which showed bilateral PE with evidence of right heart strain Previous records obtained and reviewed in epic including cardiology note from today I consulted critical care Dr. Michae Kava and [facility] hospitalist Dr. Flossie Dibble and discussed lab and imaging findings and discussed disposition.  Cardiac monitoring reviewed, sinus rhythm Social determinants considered, social isolation Critical Interventions: Initiation of IV heparin for PE and discussion with critical care  After the interventions stated above, I reevaluated the patient and found patient to be breathing fairly comfortably and not requiring supplemental oxygen Admission and further testing considered, she would benefit from mission to the hospital for further management.  She is in agreement with plan for admission.         Final Clinical Impression(s) / ED Diagnoses Final diagnoses:  Acute pulmonary embolism, unspecified pulmonary embolism type, unspecified whether acute cor pulmonale present [facility])    Rx / DC Orders ED Discharge Orders     None         Terrilee Files, MD 11/04/23 1737

## 2023-11-04 NOTE — Assessment & Plan Note (Signed)
-   BP stable-current home medications reviewed no BP meds on board -we will verify -As needed hydralazine for now

## 2023-11-04 NOTE — Assessment & Plan Note (Signed)
-   Home medication reviewed will be continued accordingly currently patient is on 1 mg Xanax twice daily as needed

## 2023-11-04 NOTE — Assessment & Plan Note (Signed)
Continue PPI ?

## 2023-11-05 DIAGNOSIS — I2699 Other pulmonary embolism without acute cor pulmonale: Secondary | ICD-10-CM | POA: Diagnosis not present

## 2023-11-05 LAB — CBC
HCT: 38.3 % (ref 36.0–46.0)
Hemoglobin: 12.4 g/dL (ref 12.0–15.0)
MCH: 32.4 pg (ref 26.0–34.0)
MCHC: 32.4 g/dL (ref 30.0–36.0)
MCV: 100 fL (ref 80.0–100.0)
Platelets: 207 10*3/uL (ref 150–400)
RBC: 3.83 MIL/uL — ABNORMAL LOW (ref 3.87–5.11)
RDW: 13.2 % (ref 11.5–15.5)
WBC: 6.2 10*3/uL (ref 4.0–10.5)
nRBC: 0 % (ref 0.0–0.2)

## 2023-11-05 LAB — GLUCOSE, CAPILLARY: Glucose-Capillary: 102 mg/dL — ABNORMAL HIGH (ref 70–99)

## 2023-11-05 LAB — BASIC METABOLIC PANEL
Anion gap: 10 (ref 5–15)
BUN: 14 mg/dL (ref 8–23)
CO2: 20 mmol/L — ABNORMAL LOW (ref 22–32)
Calcium: 9.5 mg/dL (ref 8.9–10.3)
Chloride: 106 mmol/L (ref 98–111)
Creatinine, Ser: 1.18 mg/dL — ABNORMAL HIGH (ref 0.44–1.00)
GFR, Estimated: 49 mL/min — ABNORMAL LOW (ref >60)
Glucose, Bld: 97 mg/dL (ref 70–99)
Potassium: 3.8 mmol/L (ref 3.5–5.1)
Sodium: 136 mmol/L (ref 135–145)

## 2023-11-05 LAB — HEPARIN LEVEL (UNFRACTIONATED)
Heparin Unfractionated: 0.66 [IU]/mL (ref 0.30–0.70)
Heparin Unfractionated: 0.55 [IU]/mL (ref 0.30–0.70)

## 2023-11-05 LAB — PROTIME-INR
INR: 1.1 (ref 0.8–1.2)
Prothrombin Time: 14.8 s (ref 11.4–15.2)

## 2023-11-05 LAB — APTT: aPTT: 71 s — ABNORMAL HIGH (ref 24–36)

## 2023-11-05 MED ORDER — APIXABAN 5 MG PO TABS
10.0000 mg | ORAL_TABLET | Freq: Two times a day (BID) | ORAL | Status: DC
  Administered 2023-11-05: 10 mg via ORAL
  Filled 2023-11-05: qty 2

## 2023-11-05 MED ORDER — APIXABAN (ELIQUIS) VTE STARTER PACK (10MG AND 5MG): ORAL_TABLET | ORAL | 0 refills | Status: DC

## 2023-11-05 MED ORDER — APIXABAN 5 MG PO TABS: 5.0000 mg | ORAL_TABLET | Freq: Two times a day (BID) | ORAL | Status: DC

## 2023-11-05 NOTE — Care Management Important Message (Deleted)
Important Message  Patient Details  Name: [name] MRN: [id number] Date of Birth: March 19, 1951   Important Message Given:  Yes - Medicare IM     Villa Herb, LCSWA 11/05/2023, 2:44 PM

## 2023-11-05 NOTE — Evaluation (Signed)
Occupational Therapy Evaluation Patient Details Name: [name] MRN: [id number] DOB: 02-May-1951 Today's Date: 11/05/2023   History of Present Illness [name] is a 73 year old female with extensive history of HTN, HLD,  Hypothyroidism, chronic back pain (spinal stenosis, DDD) insomnia, asthma, anxiety, depression, sickle cell trait, history of PE previously treated... . Presented today from her doctor office for chief complaint of shortness of breath.  Reporting progressive shortness of breath x 1 month and mild tightness in her chest with cough.  Cough is sometimes productive with yellowish sputum.  She had an episode of syncopal episode a week ago,   Saw her cardiologist today who referred her to ED for further evaluation with a history of PE, syncope and now shortness of breath.   Clinical Impression   Pt in bed upon OT arrival. Agreeable to OT evaluation. Pt completes bed mobility independently and with ease. She completes all transfers with supervision for safety. Pt able to give self sponge bath while standing without LOB- supervision for safety. Pt spO2 dropping into low 80s with movement but quickly recovers to 96%.  Pt left in chair with call bell in reach. Pt does not require any further OT services due to level of function. RN updated on o2 status and mobility status.       If plan is discharge home, recommend the following: Assist for transportation    Functional Status Assessment  Patient has had a recent decline in their functional status and demonstrates the ability to make significant improvements in function in a reasonable and predictable amount of time.  Equipment Recommendations  None recommended by OT       Precautions / Restrictions Precautions Precautions: Fall Restrictions Weight Bearing Restrictions Per Provider Order: No      Mobility Bed Mobility Overal bed mobility: Independent             General bed mobility comments: goes from supine  to seated EOB independently    Transfers Overall transfer level: Independent Equipment used: None               General transfer comment: sit to stand and t/f to chair supervision for safety      Balance Overall balance assessment: Independent                                         ADL either performed or assessed with clinical judgement   ADL Overall ADL's : Modified independent                                       General ADL Comments: Pt gave self sponge bath while standing with supervision, dressed self with supervision.     Vision Baseline Vision/History: 2 Legally blind Ability to See in Adequate Light: 1 Impaired Patient Visual Report: No change from baseline Vision Assessment?:  (Vision impaired at baseline) Additional Comments: Pt reports only being able to see 10 feet in front of her and has trouble seeing the ground. 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     Perception Perception: Not tested       Praxis Praxis: Not tested       Pertinent Vitals/Pain Pain Assessment Pain Assessment: No/denies pain     Extremity/Trunk Assessment Upper Extremity Assessment Upper Extremity Assessment:  Overall [facility] for tasks assessed   Lower Extremity Assessment Lower Extremity Assessment: Defer to PT evaluation   Cervical / Trunk Assessment Cervical / Trunk Assessment: Normal   Communication Communication Communication: No apparent difficulties   Cognition Arousal: Alert Behavior During Therapy: WFL for tasks assessed/performed Overall Cognitive Status: Within Functional Limits for tasks assessed                                                  Home Living Family/patient expects to be discharged to:: Private residence Living Arrangements: Alone   Type of Home: House Home Access: Stairs to enter [employer] of Steps: 2 Entrance Stairs-Rails: None (Pt reports their is a post that she can  hold on to if needed) Home Layout: One level     Bathroom Shower/Tub: Chief Strategy Officer: Standard Bathroom Accessibility: Yes How Accessible: Accessible via walker Home Equipment: Rolling Walker (2 wheels);Shower seat;Grab bars - tub/shower;Wheelchair - manual          Prior Functioning/Environment Prior Level of Function : Independent/Modified Independent             Mobility Comments: prior to SOB episode pt was able to ambulate anywhere without difficulty. SInce SOB episodes she could only ambulate 25-50 feet without fatigue. ADLs Comments: pt independent with ADLs. Pt reports low vision at baseline and unable to drive. Friends assist her with driving to grocery store etc.        OT Problem List: Decreased activity tolerance      OT Treatment/Interventions:      OT Goals(Current goals can be found in the care plan section) Acute Rehab OT Goals Patient Stated Goal: to return home OT Goal Formulation: With patient Time For Goal Achievement: 11/19/23 Potential to Achieve Goals: Good  OT Frequency:         AM-PAC OT "6 Clicks" Daily Activity     Outcome Measure Help from another person eating meals?: None Help from another person taking care of personal grooming?: None Help from another person toileting, which includes using toliet, bedpan, or urinal?: None Help from another person bathing (including washing, rinsing, drying)?: A Little Help from another person to put on and taking off regular upper body clothing?: None Help from another person to put on and taking off regular lower body clothing?: None 6 Click Score: 23   End of Session Nurse Communication: Mobility status;Other (comment) (spO2 status)  Activity Tolerance: Patient tolerated treatment well Patient left: in chair;with call bell/phone within reach  OT Visit Diagnosis: History of falling (Z91.81)                Time: 0981-1914 OT Time Calculation (min): 36 min Charges:  OT  General Charges $OT Visit: 1 Visit OT Evaluation $OT Eval Low Complexity: 1 Low OT Treatments $Self Care/Home Management : 8-22 mins    Bevelyn Ngo, OTR/L 11/05/2023, 9:35 AM

## 2023-11-05 NOTE — Care Management Obs Status (Signed)
MEDICARE OBSERVATION STATUS NOTIFICATION   Patient Details  Name: [name] MRN: [id number] Date of Birth: [birth year]   Medicare Observation Status Notification Given:  Yes    Villa Herb, LCSWA 11/05/2023, 2:44 PM

## 2023-11-05 NOTE — Care Management CC44 (Signed)
Condition Code 44 Documentation Completed  Patient Details  Name: [name] MRN: [id number] Date of Birth: 02-13-1951   Condition Code 44 given:  Yes Patient signature on Condition Code 44 notice:  Yes Documentation of 2 MD's agreement:  Yes Code 44 added to claim:  Yes    Villa Herb, LCSWA 11/05/2023, 2:45 PM

## 2023-11-05 NOTE — Discharge Summary (Signed)
Physician Discharge Summary   Patient: [name] MRN: [id number] DOB: 1951-05-04  Admit date:     11/04/2023  Discharge date: 11/05/23  Discharge Physician: Kendell Bane   PCP: Anabel Halon, MD   Recommendations at discharge:   Follow with PCP in 1 to-2 weeks Follow-up with Dr. 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 . Presented today from her doctor office for chief complaint of shortness of breath.  Reporting progressive shortness of breath x 1 month and mild tightness in her chest with cough.  Cough is sometimes productive with yellowish sputum. She had an episode of syncopal episode a week ago,  Saw her cardiologist today who referred her to ED for further evaluation with a history of PE, syncope and now shortness of breath.   ED evaluation/course: Blood pressure 121/60, pulse 88, temperature 98.2 F (36.8 C), RR 20,  weight 92.2 kg, SpO2 96%.  On room air  CBC CMP reviewed all within normal limits with exception of sodium 134, CO2 of 19, creatinine 1.16, Troponin 12, 10, BNP 120, magnesium 1.5  WUJ:[id number] bilateral pulmonary emboli. CT evidence of right  heart strain (RV/LV Ratio = 1.45) consistent with at least submassive (intermediate risk) PE  Patient was started on heparin drip EDP Dr. Charm Barges has discussed the case with PCCM Dr. Kerin Perna recommend urgent echocardiogram Does not recommend emergent thrombectomy and transfer  ------------------------------------------------------------------------------------------------------------------------ * Recurrent pulmonary embolism (HCC) -Patient was observed closely in ICU setting, remained hemodynamically stable  Denies any chest pain, satting 96% on room air, blood pressure stable -Continue heparin drip -CTA reviewed, revealing bilateral multiple recurrent pulmonary embolism-with possible right heart strain  -Consulted pulmonologist Dr. Michae Kava  Discussed the case in detail does not recommend transfer or thrombectomy at this point. Reviewed echo as below Recommended to continue heparin and transition to DOAC in 24 hours No events overnight patient successfully transition to Eliquis orally on 11/05/2023  - 2D echocardiogram: ED JF 70-75%, mild LVH, grade 1 diastolic dysfunction, right ventricle systolic function is moderately reduced, right ventricular size moderately enlarged, small pericardial effusion, valves appear normal  -Discussed with hematologist Dr. Ellin Saba, agree with current treatment plan including Eliquis.  Patient will follow-up with him on outpatient setting.    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 - Stable follow-up with PCP as an outpatient   DDD (degenerative disc disease), lumbosacral - Stable will monitor her home narcotics -Will be mindful analgesics as patient will be on heparin drip high risk of bleeding  Depression - Mood stable, continue home medication -Currently not on any SSRIs   Anxiety - Home medication reviewed will be continued accordingly currently patient is on 1 mg Xanax twice daily as needed  GERD  (gastroesophageal reflux disease) - Continue PPI  Chronic insomnia - Stable continue home medication of Ambien  Asthma -No signs of exacerbation, will continue to monitor, continue inhalers supplemental oxygen as needed  Spinal stenosis at L4-L5 level - History of back pain including spinal stenosis -Currently stable -Continue home dose analgesics with as needed IV meds  Primary open angle glaucoma (POAG) of both eyes - Stable continue home meds  Nonalcoholic steatohepatitis (NASH) - Holding any hepatotoxic medications (including statins) Monitoring LFTs  Hyperlipidemia - Patient not on statins, will continue home medication of Zetia, fenofibrate  Morbid obesity (HCC) Body mass index is 31.83 kg/m. -Discussed regarding healthy diet, exercise  Essential hypertension, benign - BP stable-current home medications reviewed no BP meds on board -we will verify    Irritable bowel syndrome - Reviewing home medication will resume accordingly Currently stable     Consultants: Pulmonary critical care Dr. Michae Kava, hematologist Dr. Ellin Saba Procedures performed: CTA, echocardiogram Disposition: Home Diet recommendation:  Discharge Diet Orders (From admission, onward)     Start     Ordered   11/05/23 0000  Diet - low sodium heart healthy        11/05/23 1244           Regular diet DISCHARGE MEDICATION: Allergies as of 11/05/2023       Reactions   Nutmeg Oil (myristica Oil) Anaphylaxis   Nut oil- skin irritation   Adhesive [tape] Other (See Comments)   Blisters skin   Aspirin Other (See Comments)   sickle cell trait--  Not recommended unless emergency   Imitrex [sumatriptan] Hives   Keflex [cephalexin] Other (See Comments)   G6PD   Levaquin [levofloxacin In D5w]    Altered mental status Affects G6PD   Other    Nut Oil- skin irritation   Statins Other (See Comments)   Myopathy - elevated CK   Sulfa Antibiotics Other (See Comments)   Patient has sickle  cell trait   Sulfonamide Derivatives Other (See Comments)   Patient has sickle cell trait   Latex Rash        Medication List     STOP taking these medications    sodium chloride 0.65 % Soln nasal spray Commonly known as: OCEAN       TAKE these medications    albuterol (2.5 MG/3ML) 0.083% nebulizer solution Commonly known as: PROVENTIL Take 3 mLs (2.5 mg total) by nebulization every 4 (four) hours as needed for wheezing or shortness of breath. What changed: Another medication with the same name was removed. Continue taking this medication, and follow the directions you see here.   ALPRAZolam 1 MG tablet Commonly known as: XANAX Take 1 tablet (1 mg total) by mouth 2 (two) times daily as needed for anxiety. What changed:  how much to take when to take this   Apixaban Starter Pack (10mg  and 5mg ) Commonly known as: ELIQUIS STARTER PACK Take as directed on package: start with two-5mg  tablets twice daily for 7 days. On day 8, switch to one-5mg  tablet twice daily.   brimonidine 0.2 % ophthalmic solution Commonly known as: ALPHAGAN Place 1 drop into both eyes 2 (two) times daily.   chlorpheniramine-HYDROcodone 10-8 MG/5ML Commonly known as: TUSSIONEX Take 5 mLs by mouth every 12 (twelve) hours as needed for cough.   dexlansoprazole 60 MG capsule Commonly known as: Dexilant Take 1 capsule (60 mg total) by mouth daily.   fenofibrate 145 MG tablet Commonly known as: TRICOR Take 1 tablet (145 mg  total) by mouth daily.   IBS SUPPORT PO Take 1 tablet by mouth daily as needed (stomach aches). FD Guard and IB Guard Peppermint/Fennel seeds   irbesartan 150 MG tablet Commonly known as: AVAPRO Take 0.5 tablets (75 mg total) by mouth daily.   latanoprost 0.005 % ophthalmic solution Commonly known as: XALATAN Place 1 drop into both eyes at bedtime.   levothyroxine 25 MCG tablet Commonly known as: SYNTHROID Take 1 tablet (25 mcg total) by mouth daily.   Magnesium 500 MG  Tabs Take 1 tablet by mouth 2 (two) times daily with a meal.   meclizine 12.5 MG tablet Commonly known as: ANTIVERT Take 1 tablet (12.5 mg total) by mouth 3 (three) times daily as needed for dizziness.   nitroGLYCERIN 0.4 MG/SPRAY spray Commonly known as: NITROLINGUAL Place 1 spray under the tongue every 5 (five) minutes as needed for chest pain.   NON FORMULARY Take 1 tablet by mouth daily as needed (Stomach Bloat). FD GUARD   ondansetron 4 MG tablet Commonly known as: ZOFRAN TAKE ONE TABLET BY MOUTH EVERY 8 HOURS AS NEEDED FOR NAUSEA OR VOMITING   PROBIOTIC DAILY PO Take 1 capsule by mouth daily. VSL #3   rizatriptan 10 MG disintegrating tablet Commonly known as: MAXALT-MLT Take 1 tablet (10 mg total) by mouth as needed for migraine. May repeat in 2 hours if needed. (ok to send out this medication to the patient, the patient can tolerate this medication and will be monitored, if this is not acceptable please cancel this script and send in a new erx with a note, thanks)   Trelegy Ellipta 100-62.5-25 MCG/ACT Aepb Generic drug: Fluticasone-Umeclidin-Vilant Inhale 1 puff into the lungs daily.   TYLENOL COLD/FLU/COUGH NIGHT PO Take 2 tablets by mouth daily as needed (cough/cold).   Vitamin D3 50 MCG (2000 UT) Tabs Take 2,000 Units by mouth daily.   zolpidem 10 MG tablet Commonly known as: AMBIEN Take 1 tablet (10 mg total) by mouth at bedtime as needed for sleep.        Discharge Exam: Filed Weights   11/04/23 1610 11/05/23 0500  Weight: 92.2 kg 94.4 kg        General:  AAO x 3,  cooperative, no distress;   HEENT:  Normocephalic, PERRL, otherwise with in Normal limits   Neuro:  CNII-XII intact. , normal motor and sensation, reflexes intact   Lungs:   Clear to auscultation BL, Respirations unlabored,  No wheezes / crackles  Cardio:    S1/S2, RRR, No murmure, No Rubs or Gallops   Abdomen:  Soft, non-tender, bowel sounds active all four quadrants, no guarding or  peritoneal signs.  Muscular  skeletal:  Limited exam -global generalized weaknesses - in bed, able to move all 4 extremities,   2+ pulses,  symmetric, No pitting edema  Skin:  Dry, warm to touch, negative for any Rashes,  Wounds: Please see nursing documentation          Condition at discharge: good  The results of significant diagnostics from this hospitalization (including imaging, microbiology, ancillary and laboratory) are listed below for reference.   Imaging Studies: US Venous Img Lower Bilateral (DVT) Result Date: 11/04/2023 CLINICAL DATA:  73 year old female with pulmonary embolism and lower extremity swelling EXAM: BILATERAL LOWER EXTREMITY VENOUS DOPPLER ULTRASOUND TECHNIQUE: Gray-scale sonography with graded compression, as well as color Doppler and duplex ultrasound were performed to evaluate the lower extremity deep venous systems from the level of the common femoral vein and including the  common femoral, femoral, profunda femoral, popliteal and calf veins including the posterior tibial, peroneal and gastrocnemius veins when visible. The superficial great saphenous vein was also interrogated. Spectral Doppler was utilized to evaluate flow at rest and with distal augmentation maneuvers in the common femoral, femoral and popliteal veins. COMPARISON:  None Available. FINDINGS: RIGHT LOWER EXTREMITY Common Femoral Vein: No evidence of thrombus. Normal compressibility, respiratory phasicity and response to augmentation. Saphenofemoral Junction: No evidence of thrombus. Normal compressibility and flow on color Doppler imaging. Profunda Femoral Vein: No evidence of thrombus. Normal compressibility and flow on color Doppler imaging. Femoral Vein: No evidence of thrombus. Normal compressibility, respiratory phasicity and response to augmentation. Popliteal Vein: No evidence of thrombus. Normal compressibility, respiratory phasicity and response to augmentation. Calf Veins: Noncompressible  right gastrocs and soleal veins. Occlusive thrombus with no flow on the duplex. Occlusive thrombus of the peroneal vein. Superficial Great Saphenous Vein: No evidence of thrombus. Normal compressibility and flow on color Doppler imaging. Other Findings:  None. LEFT LOWER EXTREMITY Common Femoral Vein: No evidence of thrombus. Normal compressibility, respiratory phasicity and response to augmentation. Saphenofemoral Junction: No evidence of thrombus. Normal compressibility and flow on color Doppler imaging. Profunda Femoral Vein: No evidence of thrombus. Normal compressibility and flow on color Doppler imaging. Femoral Vein: No evidence of thrombus. Normal compressibility, respiratory phasicity and response to augmentation. Popliteal Vein: No evidence of thrombus. Normal compressibility, respiratory phasicity and response to augmentation. Calf Veins: Occlusive thrombus of left peroneal vein. Superficial Great Saphenous Vein: No evidence of thrombus. Normal compressibility and flow on color Doppler imaging. Other Findings:  None. IMPRESSION: Directed duplex of the right lower extremity positive for distal DVT, including both muscular (gastrocs/soleal) veins and axial (peroneal) vein. Directed duplex of the left lower extremity positive for distal DVT, involving the peroneal vein. Signed, Yvone Neu. Miachel Roux, RPVI [facility] Electronically Signed   By: Gilmer Mor D.O.   On: 11/04/2023 16:12   ECHOCARDIOGRAM COMPLETE Result Date: 11/04/2023    ECHOCARDIOGRAM REPORT   Patient Name:   [name] Date of Exam: 11/04/2023 Medical Rec #:  [id number]        Height:       67.0 in Accession #:    [id number]       Weight:       203.2 lb Date of Birth:  [birth year]         BSA:          2.036 m Patient Age:    73 years         BP:           120/60 mmHg Patient Gender: F                HR:           86 bpm. [facility] Procedure: 2D Echo,  Cardiac Doppler and Color Doppler STAT ECHO Indications:    Pulmonary Embolus I26.09  History:        Patient has prior history of Echocardiogram examinations, most                 recent 05/21/2022. Risk Factors:Former Smoker, Dyslipidemia and                 Hypertension. Morbidly obese, Hx pulmonary embolism.  Sonographer:    Celesta Gentile RCS Referring Phys: [id number] MICHAEL C BUTLER IMPRESSIONS  1. Left ventricular ejection fraction, by estimation, is 70  to 75%. The left ventricle has hyperdynamic function. The left ventricle has no regional wall motion abnormalities. There is mild left ventricular hypertrophy. Left ventricular diastolic parameters are consistent with Grade I diastolic dysfunction (impaired relaxation).  2. Right ventricular systolic function is moderately reduced. The right ventricular size is moderately enlarged. Tricuspid regurgitation signal is inadequate for assessing PA pressure.  3. A small pericardial effusion is present. The pericardial effusion is circumferential.  4. The mitral valve is normal in structure. No evidence of mitral valve regurgitation. No evidence of mitral stenosis.  5. The aortic valve is tricuspid. Aortic valve regurgitation is not visualized. No aortic stenosis is present.  6. The inferior vena cava is normal in size with greater than 50% respiratory variability, suggesting right atrial pressure of 3 mmHg. FINDINGS  Left Ventricle: Left ventricular ejection fraction, by estimation, is 70 to 75%. The left ventricle has hyperdynamic function. The left ventricle has no regional wall motion abnormalities. The left ventricular internal cavity size was normal in size. There is mild left ventricular hypertrophy. Left ventricular diastolic parameters are consistent with Grade I diastolic dysfunction (impaired relaxation). Normal left ventricular filling pressure. Right Ventricle: The right ventricular size is moderately enlarged. Right vetricular wall thickness was not well  visualized. Right ventricular systolic function is moderately reduced. Tricuspid regurgitation signal is inadequate for assessing PA pressure. Left Atrium: Left atrial size was normal in size. Right Atrium: Right atrial size was not well visualized. Pericardium: A small pericardial effusion is present. The pericardial effusion is circumferential. Mitral Valve: The mitral valve is normal in structure. No evidence of mitral valve regurgitation. No evidence of mitral valve stenosis. Tricuspid Valve: The tricuspid valve is not well visualized. Tricuspid valve regurgitation is not demonstrated. No evidence of tricuspid stenosis. Aortic Valve: The aortic valve is tricuspid. Aortic valve regurgitation is not visualized. No aortic stenosis is present. Aortic valve mean gradient measures 3.9 mmHg. Aortic valve peak gradient measures 8.3 mmHg. Aortic valve area, by VTI measures 2.21 cm. Pulmonic Valve: The pulmonic valve was not well visualized. Pulmonic valve regurgitation is not visualized. No evidence of pulmonic stenosis. Aorta: The aortic root is normal in size and structure. Venous: The inferior vena cava is normal in size with greater than 50% respiratory variability, suggesting right atrial pressure of 3 mmHg. IAS/Shunts: No atrial level shunt detected by color flow Doppler.  LEFT VENTRICLE PLAX 2D LVIDd:         4.10 cm   Diastology LVIDs:         2.50 cm   LV e' medial:    6.20 cm/s LV PW:         1.10 cm   LV E/e' medial:  10.2 LV IVS:        1.10 cm   LV e' lateral:   7.51 cm/s LVOT diam:     1.90 cm   LV E/e' lateral: 8.4 LV SV:         56 LV SV Index:   28 LVOT Area:     2.84 cm  RIGHT VENTRICLE RV S prime:     12.80 cm/s TAPSE (M-mode): 2.2 cm LEFT ATRIUM             Index        RIGHT ATRIUM           Index LA diam:        4.00 cm 1.96 cm/m   RA Area:     24.00 cm LA Vol (A2C):  42.3 ml 20.78 ml/m  RA Volume:   88.50 ml  43.47 ml/m LA Vol (A4C):   50.2 ml 24.66 ml/m LA Biplane Vol: 49.7 ml 24.41 ml/m   AORTIC VALVE AV Area (Vmax):    2.23 cm AV Area (Vmean):   2.00 cm AV Area (VTI):     2.21 cm AV Vmax:           143.81 cm/s AV Vmean:          90.609 cm/s AV VTI:            0.255 m AV Peak Grad:      8.3 mmHg AV Mean Grad:      3.9 mmHg LVOT Vmax:         113.00 cm/s LVOT Vmean:        63.800 cm/s LVOT VTI:          0.199 m LVOT/AV VTI ratio: 0.78  AORTA Ao Root diam: 3.30 cm MITRAL VALVE MV Area (PHT): 2.76 cm     SHUNTS MV Decel Time: 275 msec     Systemic VTI:  0.20 m MV E velocity: 63.40 cm/s   Systemic Diam: 1.90 cm MV A velocity: 100.00 cm/s MV E/A ratio:  0.63 Dina Rich MD Electronically signed by Dina Rich MD Signature Date/Time: 11/04/2023/1:11:43 PM    Final    CT Angio Chest PE W/Cm &/Or Wo Cm Result Date: 11/04/2023 CLINICAL DATA:  Pulmonary embolism (PE) suspected, high prob. Respiratory distress, shortness of breath EXAM: CT ANGIOGRAPHY CHEST WITH CONTRAST TECHNIQUE: Multidetector CT imaging of the chest was performed using the standard protocol during bolus administration of intravenous contrast. Multiplanar CT image reconstructions and MIPs were obtained to evaluate the vascular anatomy. RADIATION DOSE REDUCTION: This exam was performed according to the departmental dose-optimization program which includes automated exposure control, adjustment of the mA and/or kV according to patient size and/or use of iterative reconstruction technique. CONTRAST:  75mL OMNIPAQUE IOHEXOL 350 MG/ML SOLN COMPARISON:  Chest x-ray today.  CT 07/26/2015 FINDINGS: Cardiovascular: Numerous bilateral filling defects within the pulmonary arteries compatible with pulmonary emboli. Evidence of right heart strain with elevated RV/LV ratio of 1.45. Heart is borderline in size. Small pericardial effusion. Aorta normal caliber. Mediastinum/Nodes: No mediastinal, hilar, or axillary adenopathy. Trachea and esophagus are unremarkable. Thyroid unremarkable. Lungs/Pleura: No confluent opacities or effusions. Upper  Abdomen: No acute findings Musculoskeletal: Chest wall soft tissues are unremarkable. No acute bony abnormality. Review of the MIP images confirms the above findings. IMPRESSION: Multiple bilateral pulmonary emboli. CT evidence of right heart strain (RV/LV Ratio = 1.45) consistent with at least submassive (intermediate risk) PE. The presence of right heart strain has been associated with an increased risk of morbidity and mortality. Please refer to the "Code PE Focused" order set in EPIC. Borderline heart size.  Small pericardial effusion. These results were called by telephone at the time of interpretation on 11/04/2023 at 11:28 am to provider [facility] , who verbally acknowledged these results. Electronically Signed   By: Charlett Nose M.D.   On: 11/04/2023 11:29   DG Chest Port 1 View Result Date: 11/04/2023 CLINICAL DATA:  Shortness of breath. EXAM: PORTABLE CHEST 1 VIEW COMPARISON:  05/12/2022. FINDINGS: Bilateral lung fields are clear. Bilateral costophrenic angles are clear. Normal cardio-mediastinal silhouette. No acute osseous abnormalities. The soft tissues are within normal limits. IMPRESSION: No active disease. Electronically Signed   By: Jules Schick M.D.   On: 11/04/2023 10:42    Microbiology: Results for  orders placed or performed during the hospital encounter of 11/04/23  Resp panel by RT-PCR (RSV, Flu A&B, Covid) Anterior Nasal Swab     Status: None   Collection Time: 11/04/23  9:55 AM   Specimen: Anterior Nasal Swab  Result Value Ref Range Status   SARS Coronavirus 2 by RT PCR NEGATIVE NEGATIVE Final    Comment: (NOTE) SARS-CoV-2 target nucleic acids are NOT DETECTED.  The SARS-CoV-2 RNA is generally detectable in upper respiratory specimens during the acute phase of infection. The lowest concentration of SARS-CoV-2 viral copies this assay can detect is 138 copies/mL. A negative result does not preclude SARS-Cov-2 infection and should not be used as the sole basis for  treatment or other patient management decisions. A negative result may occur with  improper specimen collection/handling, submission of specimen other than nasopharyngeal swab, presence of viral mutation(s) within the areas targeted by this assay, and inadequate number of viral copies(<138 copies/mL). A negative result must be combined with clinical observations, patient history, and epidemiological information. The expected result is Negative.  Fact Sheet for Patients:  BloggerCourse.com  Fact Sheet for Healthcare Providers:  SeriousBroker.it  This test is no t yet approved or cleared by the [place] FDA and  has been authorized for detection and/or diagnosis of SARS-CoV-2 by FDA under an Emergency Use Authorization (EUA). This EUA will remain  in effect (meaning this test can be used) for the duration of the COVID-19 declaration under Section 564(b)(1) of the Act, 21 U.S.C.section 360bbb-3(b)(1), unless the authorization is terminated  or revoked sooner.       Influenza A by PCR NEGATIVE NEGATIVE Final   Influenza B by PCR NEGATIVE NEGATIVE Final    Comment: (NOTE) The Xpert Xpress SARS-CoV-2/FLU/RSV plus assay is intended as an aid in the diagnosis of influenza from Nasopharyngeal swab specimens and should not be used as a sole basis for treatment. Nasal washings and aspirates are unacceptable for Xpert Xpress SARS-CoV-2/FLU/RSV testing.  Fact Sheet for Patients: BloggerCourse.com  Fact Sheet for Healthcare Providers: SeriousBroker.it  This test is not yet approved or cleared by the [place] FDA and has been authorized for detection and/or diagnosis of SARS-CoV-2 by FDA under an Emergency Use Authorization (EUA). This EUA will remain in effect (meaning this test can be used) for the duration of the COVID-19 declaration under Section 564(b)(1) of the Act, 21  U.S.C. section 360bbb-3(b)(1), unless the authorization is terminated or revoked.     Resp Syncytial Virus by PCR NEGATIVE NEGATIVE Final    Comment: (NOTE) Fact Sheet for Patients: BloggerCourse.com  Fact Sheet for Healthcare Providers: SeriousBroker.it  This test is not yet approved or cleared by the [place] FDA and has been authorized for detection and/or diagnosis of SARS-CoV-2 by FDA under an Emergency Use Authorization (EUA). This EUA will remain in effect (meaning this test can be used) for the duration of the COVID-19 declaration under Section 564(b)(1) of the Act, 21 U.S.C. section 360bbb-3(b)(1), unless the authorization is terminated or revoked.  Performed at [facility], [address], [place], [place] [address]   MRSA Next Gen by PCR, Nasal     Status: None   Collection Time: 11/04/23  4:20 PM   Specimen: Nasal Mucosa; Nasal Swab  Result Value Ref Range Status   MRSA by PCR Next Gen NOT DETECTED NOT DETECTED Final    Comment: (NOTE) The GeneXpert MRSA Assay (FDA approved for NASAL specimens only), is one component of a comprehensive MRSA colonization surveillance program.  It is not intended to diagnose MRSA infection nor to guide or monitor treatment for MRSA infections. Test performance is not FDA approved in patients less than 35 years old. Performed at [facility], [address], [place], [place] [address]    *Note: Due to a large number of results and/or encounters for the requested time period, some results have not been displayed. A complete set of results can be found in Results Review.    Labs: CBC: Recent Labs  Lab 11/04/23 0955 11/05/23 0453  WBC 6.5 6.2  NEUTROABS 4.0  --   HGB 12.3 12.4  HCT 37.6 38.3  MCV 98.9 100.0  PLT 216 207   Basic Metabolic Panel: Recent Labs  Lab 11/03/23 1246 11/04/23 0955 11/05/23 0453  NA 136 134* 136  K 3.7 3.9 3.8  CL 103 106 106  CO2 21* 19*  20*  GLUCOSE 120* 110* 97  BUN 15 17 14   CREATININE 1.31* 1.16* 1.18*  CALCIUM 9.7 8.9 9.5  MG 1.5* 1.5*  --   PHOS  --  2.9  --    Liver Function Tests: Recent Labs  Lab 11/03/23 1246  AST 29  ALT 17  ALKPHOS 29*  BILITOT 1.8*  PROT 7.1  ALBUMIN 3.8   CBG: Recent Labs  Lab 11/05/23 0807  GLUCAP 102*    Discharge time spent: greater than 30 minutes.  Signed: Kendell Bane, MD [facility] 11/05/2023

## 2023-11-05 NOTE — Progress Notes (Signed)
PHARMACY - ANTICOAGULATION CONSULT NOTE  Pharmacy Consult for heparin Indication: pulmonary embolus  Patient Measurements: Height: 5\' 7"  (170.2 cm) Weight: 94.4 kg (208 lb 1.8 oz) IBW/kg (Calculated) : 61.6 Heparin Dosing Weight: 82 kg  Vital Signs: Temp: 98.1 F (36.7 C) (01/31 0300) Temp Source: Oral (01/31 0300) BP: 163/75 (01/31 0400) Pulse Rate: 91 (01/31 0400)  Labs: Recent Labs    11/03/23 1246 11/04/23 0955 11/04/23 1121 11/04/23 1840 11/05/23 0453  HGB  --  12.3  --   --  12.4  HCT  --  37.6  --   --  38.3  PLT  --  216  --   --  207  HEPARINUNFRC  --   --   --  0.80* 0.66  CREATININE 1.31* 1.16*  --   --  1.18*  TROPONINIHS  --  12 10  --   --    Estimated Creatinine Clearance: 51.6 mL/min (A) (by C-G formula based on SCr of 1.18 mg/dL (H)).  Assessment: Pharmacy consulted to dose heparin in patient with pulmonary embolism.  CT shows submassive PE w/ right heart strain. Patient is not on anticoagulation prior to admission. CBC WNL, D-Dimer 2.19  Date              HL          Rate 1/30 1840     0.80         Supratherapeutic; 1400 units/hr 1/31 0453     0.66   Therapeutic, no issues reported   Goal of Therapy:  Heparin level 0.3-0.7 units/ml Monitor platelets by anticoagulation protocol: Yes   Plan:  Continue heparin rate at 1300 units/hr  Check confirmatory heparin level in 8 hours after rate adjustment  Monitor CBC daily and for signs/symptoms of bleeding   Arabella Merles, PharmD. Clinical Pharmacist 11/05/2023 5:53 AM

## 2023-11-05 NOTE — Care Management Important Message (Signed)
Important Message  Patient Details  Name: [name] MRN: [id number] Date of Birth: 23-Jul-1951   Important Message Given:  Yes - Medicare IM     Corey Harold 11/05/2023, 2:04 PM

## 2023-11-05 NOTE — Progress Notes (Signed)
PT Cancellation Note  Patient Details Name: [name] MRN: [id number] DOB: Feb 17, 1951   Cancelled Treatment:    Reason Eval/Treat Not Completed: PT screened, no needs identified, will sign off. Patient independent for bed mobility, transfers and walking in room with Supervision.    2:44 PM, 11/05/23 Ocie Bob, MPT Physical Therapist with [facility] 336 [phone] office [phone] mobile phone

## 2023-11-08 ENCOUNTER — Other Ambulatory Visit: Payer: Self-pay | Admitting: Neurology

## 2023-11-10 ENCOUNTER — Ambulatory Visit (INDEPENDENT_AMBULATORY_CARE_PROVIDER_SITE_OTHER): Payer: Medicare Other | Admitting: "Endocrinology

## 2023-11-10 ENCOUNTER — Encounter: Payer: Self-pay | Admitting: "Endocrinology

## 2023-11-10 DIAGNOSIS — E782 Mixed hyperlipidemia: Secondary | ICD-10-CM

## 2023-11-10 DIAGNOSIS — R7303 Prediabetes: Secondary | ICD-10-CM | POA: Diagnosis not present

## 2023-11-10 DIAGNOSIS — E039 Hypothyroidism, unspecified: Secondary | ICD-10-CM

## 2023-11-10 LAB — POCT GLYCOSYLATED HEMOGLOBIN (HGB A1C): HbA1c, POC (prediabetic range): 5.1 % — AB (ref 5.7–6.4)

## 2023-11-10 NOTE — Addendum Note (Signed)
 Addended by: Ernst Heap on: 11/10/2023 03:25 PM   Modules accepted: Orders

## 2023-11-10 NOTE — Progress Notes (Addendum)
 11/10/2023, 2:38 PM  Endocrinology follow-up note   Subjective:    Patient ID: [name], female    DOB: 1950-11-09, PCP Tobie Suzzane POUR, MD   Past Medical History:  Diagnosis Date   Anal fissure    Anxiety    Arthritis    Asthma    C. difficile diarrhea 09/17/2021   Cataract    Phreesia 01/05/2021   Chest pain    a. 02/2000 Cath: nl cors, EF 60%;  b. 10/2010 MV: nl LV, no ischemia/infarct;  c. 03/2014 Admit c/p, r/o->grief from husbands death.   Chronic RUQ pain 06-16-2008   EUS slightly dilated CBD (7.44mm), otherwise nl   Depression    Diverticulosis    Eczema    Exposure to chemical inhalation mid 1970s   Fatty liver    G6PD deficiency    Gallstones    GERD (gastroesophageal reflux disease)    Glaucoma    History of pulmonary embolus (PE)    Treated with Eliquis  2015/06/17   HTN (hypertension)    Hypercholesteremia    a. intolerant to statins.   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 She has limited vision in the right eye.   Migraines    inner ocular   Ocular migraine    Palpitations    Pleurisy    Pneumonia 07/16/2021   Patient was exposed to a harsh chemical in the 1970's that created scar tissue in her lungs. She has had pneumonia several times in the past.   PONV (postoperative nausea and vomiting)    Pulmonary emboli (HCC)    Recurrent upper respiratory infection (URI)    Renal cyst    Retinal cyst    Sickle cell trait (HCC)    Tubular adenoma of colon 09/17/2011   Urticaria    Past Surgical History:  Procedure Laterality Date   ABDOMINAL HYSTERECTOMY     ADENOIDECTOMY     ANGIOPLASTY     APPENDECTOMY     BIOPSY N/A 03/14/2013   Procedure: SMALL BOWEL AND GASTRIC BIOPSIES (Procedure #1);  Surgeon: Margo LITTIE Haddock, MD;  Location: [facility];  Service: Endoscopy;  Laterality: N/A;   CARDIAC CATHETERIZATION Left  02/11/2000   CATARACT EXTRACTION Left    CATARACT EXTRACTION W/PHACO Right 03/19/2021   Procedure: CATARACT EXTRACTION PHACO AND INTRAOCULAR LENS PLACEMENT (IOC) RIGHT 6.75 00:50.4;  Surgeon: Mittie Gaskin, MD;  Location: [facility];  Service: Ophthalmology;  Laterality: Right;   CHOLECYSTECTOMY  12/2007   COLONOSCOPY  12/22/2010   MFM:[id number], cecal adenomatous polyp   COLONOSCOPY WITH PROPOFOL  N/A 03/31/2016   Dr. Haddock: four sessile polyps rectum/sigmoid colon, diverticulosi, ext/int hemorrhoids. hyperplastic polyps, next tcs 5 years.   COLONOSCOPY WITH PROPOFOL  N/A 04/21/2021   anal fissure, non-bleeding internal hemorrhoids, right colon diverticulosis, one 2 mm polyp in ascending, three 4-6 mm polyps in descending colon. Tubular adenomas. 5 year surveillance.   complete hysterectomy     ENTEROSCOPY N/A 03/14/2013   DOQ:fpoi gastritis/ulcers has healed   ESOPHAGOGASTRODUODENOSCOPY  09/22/2011   DOQ:Fpoi gastritis/Duodenitis   EXCISIONAL HEMORRHOIDECTOMY     FLEXIBLE  SIGMOIDOSCOPY N/A 03/14/2013   SLF:3 colon polyp removed/moderate sized internal hemorrhoids   FLEXIBLE SIGMOIDOSCOPY N/A 06/09/2016   Procedure: FLEXIBLE SIGMOIDOSCOPY;  Surgeon: Margo LITTIE Haddock, MD;  Location: [facility];  Service: Endoscopy;  Laterality: N/A;  rectal polyps times 2   FLEXIBLE SIGMOIDOSCOPY N/A 03/18/2018   Procedure: FLEXIBLE SIGMOIDOSCOPY;  Surgeon: Haddock Margo LITTIE, MD;  Location: [facility];  Service: Endoscopy;  Laterality: N/A;  9:15am   FOOT SURGERY     bunion removal left   GIVENS CAPSULE STUDY N/A 02/10/2013   Procedure: GIVENS CAPSULE STUDY;  Surgeon: Margo LITTIE Haddock, MD;  Location: [facility];  Service: Endoscopy;  Laterality: N/A;  730   HEEL SPUR EXCISION     right    HEMORRHOID BANDING N/A 03/14/2013   Procedure: HEMORRHOID BANDING (Procedure #3)  3 bands applied Onu#[id number] Exp 02/02/2014 ;  Surgeon: Margo LITTIE Haddock, MD;  Location: [facility];  Service: Endoscopy;   Laterality: N/A;   HEMORRHOID SURGERY N/A 04/24/2016   Procedure: EXTENSIVE HEMORRHOIDECTOMY;  Surgeon: Selinda Artist Moats, MD;  Location: [facility];  Service: General;  Laterality: N/A;   LAPAROSCOPY     adhesions   POLYPECTOMY N/A 03/14/2013   DOQ:FPOI Gastritis . ULCERS SEEN ON MAY 6 HAVE HEALED   POLYPECTOMY  03/31/2016   Procedure: POLYPECTOMY;  Surgeon: Margo LITTIE Haddock, MD;  Location: [facility];  Service: Endoscopy;;  sigmoid colon polyps x2, rectal polyps x2   POLYPECTOMY  04/21/2021   Procedure: POLYPECTOMY;  Surgeon: Cindie Carlin POUR, DO;  Location: [facility];  Service: Endoscopy;;   RECTAL EXAM UNDER ANESTHESIA N/A 07/23/2021   Procedure: RECTAL EXAM UNDER ANESTHESIA;  Surgeon: Debby Hila, MD;  Location: [facility];  Service: General;  Laterality: N/A;   SPHINCTEROTOMY N/A 07/23/2021   Procedure: CHEMICAL SPHINCTEROTOMY;  Surgeon: Debby Hila, MD;  Location: [facility];  Service: General;  Laterality: N/A;   TONSILLECTOMY     TUBAL LIGATION     Social History   Socioeconomic History   Marital status: Widowed    Spouse name: Not on file   Number of children: 1   Years of education: Not on file   Highest education level: Not on file  Occupational History   Occupation: homemaker    Employer: UNEMPLOYED  Tobacco Use   Smoking status: Former    Current packs/day: 0.00    Average packs/day: 0.5 packs/day for 30.0 years (15.0 ttl pk-yrs)    Types: Cigarettes    Start date: 10/15/1966    Quit date: 10/15/1996    Years since quitting: 27.0   Smokeless tobacco: Never   Tobacco comments:    Stopped smoking ~ 2000  Vaping Use   Vaping status: Never Used  Substance and Sexual Activity   Alcohol use: Not Currently    Alcohol/week: 4.0 standard drinks of alcohol    Types: 4 Glasses of wine per week    Comment: 1 drinks every couple of weeks   Drug use: No   Sexual activity: Not Currently    Birth control/protection: Surgical  Other  Topics Concern   Not on file  Social History Narrative   Does not routinely exercise. Husband passed in JUN 2015 due to prostate ca.   Social Drivers of Corporate investment banker Strain: Low Risk  (06/11/2023)   Overall Financial Resource Strain (CARDIA)    Difficulty of Paying Living Expenses: Not hard at all  Food Insecurity: No Food Insecurity (  11/04/2023)   Hunger Vital Sign    Worried About Running Out of Food in the Last Year: Never true    Ran Out of Food in the Last Year: Never true  Transportation Needs: No Transportation Needs (11/04/2023)   PRAPARE - Administrator, Civil Service (Medical): No    Lack of Transportation (Non-Medical): No  Physical Activity: Sufficiently Active (06/11/2023)   Exercise Vital Sign    Days of Exercise per Week: 6 days    Minutes of Exercise per Session: 30 min  Stress: No Stress Concern Present (06/11/2023)   [employer] of Occupational Health - Occupational Stress Questionnaire    Feeling of Stress : Not at all  Social Connections: Socially Isolated (11/04/2023)   Social Connection and Isolation Panel [NHANES]    Frequency of Communication with Friends and Family: Three times a week    Frequency of Social Gatherings with Friends and Family: Once a week    Attends Religious Services: Never    Database administrator or Organizations: No    Attends Banker Meetings: Never    Marital Status: Widowed   Family History  Problem Relation Age of Onset   Colon cancer Mother        67s   Urticaria Mother    Heart disease Mother    Colon polyps Mother    Cancer Father        oral   Crohn's disease Sister    Cancer Sister        around heart   Colon polyps Sister    Diabetes Brother    Other Brother        intestinal sugery   Colon polyps Brother    Colon cancer Maternal Grandmother    Colon cancer Maternal Grandfather    Colon cancer Paternal Grandfather    Anesthesia problems Neg Hx    Outpatient Encounter  Medications as of 11/10/2023  Medication Sig   albuterol  (PROVENTIL ) (2.5 MG/3ML) 0.083% nebulizer solution Take 3 mLs (2.5 mg total) by nebulization every 4 (four) hours as needed for wheezing or shortness of breath.   ALPRAZolam  (XANAX ) 1 MG tablet Take 1 tablet (1 mg total) by mouth 2 (two) times daily as needed for anxiety. (Patient taking differently: Take 0.5-1 mg by mouth at bedtime.)   APIXABAN  (ELIQUIS ) VTE STARTER PACK (10MG  AND 5MG ) Take as directed on package: start with two-5mg  tablets twice daily for 7 days. On day 8, switch to one-5mg  tablet twice daily.   brimonidine  (ALPHAGAN ) 0.2 % ophthalmic solution Place 1 drop into both eyes 2 (two) times daily.   chlorpheniramine-HYDROcodone  (TUSSIONEX) 10-8 MG/5ML Take 5 mLs by mouth every 12 (twelve) hours as needed for cough.   Cholecalciferol  (VITAMIN D3) 50 MCG (2000 UT) TABS Take 2,000 Units by mouth daily.   dexlansoprazole  (DEXILANT ) 60 MG capsule Take 1 capsule (60 mg total) by mouth daily.   fenofibrate  (TRICOR ) 145 MG tablet Take 1 tablet (145 mg total) by mouth daily.   Fluticasone -Umeclidin-Vilant (TRELEGY ELLIPTA ) 100-62.5-25 MCG/ACT AEPB Inhale 1 puff into the lungs daily.   irbesartan  (AVAPRO ) 150 MG tablet Take 0.5 tablets (75 mg total) by mouth daily.   latanoprost  (XALATAN ) 0.005 % ophthalmic solution Place 1 drop into both eyes at bedtime.   Magnesium  500 MG TABS Take 1 tablet by mouth 3 (three) times daily with meals.   meclizine  (ANTIVERT ) 12.5 MG tablet Take 1 tablet (12.5 mg total) by mouth 3 (three) times daily as  needed for dizziness.   nitroGLYCERIN  (NITROLINGUAL ) 0.4 MG/SPRAY spray Place 1 spray under the tongue every 5 (five) minutes as needed for chest pain.   NON FORMULARY Take 1 tablet by mouth daily as needed (Stomach Bloat). FD GUARD   Nutritional Supplements (IBS SUPPORT PO) Take 1 tablet by mouth daily as needed (stomach aches). FD Guard and IB Guard Peppermint/Fennel seeds   ondansetron  (ZOFRAN ) 4 MG tablet  TAKE ONE TABLET BY MOUTH EVERY 8 HOURS AS NEEDED FOR NAUSEA OR VOMITING   Phenyleph-Doxylamine-DM-APAP (TYLENOL  COLD/FLU/COUGH NIGHT PO) Take 2 tablets by mouth daily as needed (cough/cold).   Probiotic Product (PROBIOTIC DAILY PO) Take 1 capsule by mouth daily. VSL #3   rizatriptan  (MAXALT -MLT) 10 MG disintegrating tablet DISSOLVE ONE TABLET ON TONGUE AS NEEDED FOR MIGRAINE. MAY REPEAT IN 2 HOURS IF NEEDED   zolpidem  (AMBIEN ) 10 MG tablet Take 1 tablet (10 mg total) by mouth at bedtime as needed for sleep.   [DISCONTINUED] levothyroxine  (SYNTHROID ) 25 MCG tablet Take 1 tablet (25 mcg total) by mouth daily.   No facility-administered encounter medications on file as of 11/10/2023.   ALLERGIES: Allergies  Allergen Reactions   Nutmeg Oil (Myristica Oil) Anaphylaxis    Nut oil- skin irritation   Adhesive [Tape] Other (See Comments)    Blisters skin   Aspirin Other (See Comments)    sickle cell trait--  Not recommended unless emergency   Imitrex  [Sumatriptan ] Hives   Keflex  [Cephalexin ] Other (See Comments)    G6PD   Levaquin  [Levofloxacin  In D5w]     Altered mental status Affects G6PD   Other     Nut Oil- skin irritation    Statins Other (See Comments)    Myopathy - elevated CK   Sulfa Antibiotics Other (See Comments)    Patient has sickle cell trait   Sulfonamide Derivatives Other (See Comments)    Patient has sickle cell trait   Latex Rash    VACCINATION STATUS: Immunization History  Administered Date(s) Administered   Fluad Quad(high Dose 65+) 06/05/2019, 07/22/2020, 06/16/2021, 06/30/2022   Fluad Trivalent(High Dose 65+) 06/03/2023   Influenza Split 07/12/2012   Influenza,inj,Quad PF,6+ Mos 08/03/2014, 07/19/2015, 08/11/2016, 06/24/2017, 07/04/2018   Moderna Covid-19 Vaccine  Bivalent Booster 70yrs & up 01/31/2021, 06/30/2021, 01/30/2022   Moderna SARS-COV2 Booster Vaccination 06/30/2022   Moderna Sars-Covid-2 Vaccination 11/16/2019, 12/15/2019, 08/02/2020   Pneumococcal  Conjugate-13 02/15/2018   Pneumococcal Polysaccharide-23 08/03/2014, 01/12/2020   Rsv, Bivalent, Protein Subunit Rsvpref,pf Mar[place]) 06/30/2022   Tdap 08/25/2012, 08/03/2014   Unspecified SARS-COV-2 Vaccination 07/07/2023   Zoster Recombinant(Shingrix ) 01/16/2020, 03/21/2020   Zoster, Live 08/25/2012    HPI   DAMARYS SPEIR is 73 y.o. female who presents today with a medical history as above. she is being seen in follow-up  for history of hypercalcemia, hypothyroidism, hypomagnesemia.  In the interval, she was hospitalized for shortness of breath which revealed pulmonary embolism, currently on anticoagulation.    She has lost significant amount of weight since the process, dropping her BMI by almost 3 points since last visit.   Her recent labs show normocalcemia, mild hypomagnesemia, improved lipid panel. She is also on vitamin D  deficiency.  She is  on regular supplement of magnesium  500 milligrams p.o. twice daily.     Her previsit labs show inadequate correction for hypomagnesemia.   She continues to have stable calcium level after she was advised to discontinue hydrochlorothiazide .    She has no new complaints today.  She denies any prior history of parathyroid/pituitary/adrenal dysfunction. -  She does have a family history of some thyroid  dysfunction in her cousins.   She is on multiple vitamin supplements/medications including inhalers for asthma.  Review of Systems Limited as above.  Objective:       11/10/2023    1:52 PM 11/05/2023   10:00 AM 11/05/2023    9:00 AM  Vitals with BMI  Height 5' 7    Weight 198 lbs 6 oz    BMI 31.07    Systolic 114 106 846  Diastolic 76 73 125  Pulse 84 97 98    BP 114/76   Pulse 84   Ht 5' 7 (1.702 m)   Wt 198 lb 6.4 oz (90 kg)   BMI 31.07 kg/m   Wt Readings from Last 3 Encounters:  11/10/23 198 lb 6.4 oz (90 kg)  11/05/23 208 lb 1.8 oz (94.4 kg)  11/04/23 203 lb 3.2 oz (92.2 kg)    Physical Exam    CMP ( most  recent) CMP     Component Value Date/Time   NA 136 11/05/2023 0453   NA 137 02/01/2023 1411   K 3.8 11/05/2023 0453   K 4.3 07/12/2012 0755   CL 106 11/05/2023 0453   CL 100 07/12/2012 0755   CO2 20 (L) 11/05/2023 0453   GLUCOSE 97 11/05/2023 0453   BUN 14 11/05/2023 0453   BUN 22 02/01/2023 1411   CREATININE 1.18 (H) 11/05/2023 0453   CREATININE 1.23 (H) 12/11/2020 1023   CALCIUM 9.5 11/05/2023 0453   CALCIUM 11.0 07/12/2012 0755   PROT 7.1 11/03/2023 1246   PROT 6.7 02/01/2023 1411   PROT 8.1 07/12/2012 0755   ALBUMIN 3.8 11/03/2023 1246   ALBUMIN 4.5 02/01/2023 1411   AST 29 11/03/2023 1246   AST 42 07/12/2012 0755   ALT 17 11/03/2023 1246   ALKPHOS 29 (L) 11/03/2023 1246   ALKPHOS 32 07/12/2012 0755   BILITOT 1.8 (H) 11/03/2023 1246   BILITOT 0.6 02/01/2023 1411   BILITOT 1.0 07/12/2012 0755   GFRNONAA 49 (L) 11/05/2023 0453   GFRNONAA 36 (L) 07/22/2020 1441   GFRAA 41 (L) 07/22/2020 1441     Diabetic Labs (most recent): Lab Results  Component Value Date   HGBA1C 5.6 02/01/2023   HGBA1C 5.2 01/20/2022   HGBA1C 5.1 12/11/2020   MICROALBUR 16.6 01/04/2017     Lipid Panel ( most recent) Lipid Panel     Component Value Date/Time   CHOL 176 11/03/2023 1246   CHOL 205 (H) 01/20/2022 1433   CHOL 254 07/12/2012 0755   TRIG 173 (H) 11/03/2023 1246   TRIG 325 07/12/2012 0755   HDL 22 (L) 11/03/2023 1246   HDL 26 (L) 01/20/2022 1433   CHOLHDL 8.0 11/03/2023 1246   VLDL 35 11/03/2023 1246   LDLCALC 119 (H) 11/03/2023 1246   LDLCALC 139 (H) 01/20/2022 1433   LDLCALC 156 (H) 12/11/2020 1023   LABVLDL 40 01/20/2022 1433      Lab Results  Component Value Date   TSH 1.393 11/03/2023   TSH 2.855 04/28/2023   TSH 4.580 (H) 02/01/2023   TSH 2.248 10/28/2022   TSH 2.306 04/27/2022   TSH 2.200 09/08/2021   TSH 1.400 03/24/2021   TSH 1.770 09/23/2020   TSH 1.710 05/24/2020   TSH 5.12 (H) 01/12/2020   FREET4 1.20 (H) 11/03/2023   FREET4 0.96 04/28/2023    FREET4 1.08 02/01/2023   FREET4 1.19 (H) 10/28/2022   FREET4 1.04 04/27/2022   FREET4 1.22 09/08/2021  FREET4 1.36 03/24/2021   FREET4 1.46 09/23/2020   FREET4 1.31 05/24/2020   FREET4 1.3 01/12/2020      Assessment & Plan:   1. Hypercalcemia- fluctuating 2.  Hypomagnesemia  3.  Hypothyroidism  4.  Hyperlipidemia    -She presents with normocalemia with normal albumin.   Her current calcium level at 10.3 is consistent with normocalcemia.  She is advised to increase her magnesium  - magnesium  500 mg p.o. 3 times daily AC.   She will need continued followup with ca/PTH, Mg, Phos  Regarding hypothyroidism -Her previsit thyroid  function tests are consistent with slight over replacement.  Her recent weight loss has probably resolved this problem.  She is advised to discontinue levothyroxine  for now.   -Decision will be made after her next thyroid  function test.  -Her neck exam is negative for goiter, no need for thyroid  imaging for now.   She has dyslipidemia, obesity, statin intolerance.  She presents with some improvement in her lipid panel.  She is on Tricor  145 mg p.o. daily.     She does not tolerate statin medication.   - she is advised to maintain close follow up with Tobie Suzzane POUR, MD for primary care needs.   I spent  25  minutes in the care of the patient today including review of labs from Thyroid  Function, CMP, and other relevant labs ; imaging/biopsy records (current and previous including abstractions from other facilities); face-to-face time discussing  her lab results and symptoms, medications doses, her options of short and long term treatment based on the latest standards of care / guidelines;   and documenting the encounter.  Gretchen Weinfeld Delbuono  participated in the discussions, expressed understanding, and voiced agreement with the above plans.  All questions were answered to her satisfaction. she is encouraged to contact clinic should she have any questions or  concerns prior to her return visit.    Follow up plan: Return in about 6 months (around 05/09/2024) for F/U with Pre-visit Labs.   Ranny Earl, MD [facility] [address] [place], [place] [address] Phone: [phone]  Fax: [phone]     11/10/2023, 2:38 PM  This note was partially dictated with voice recognition software. Similar sounding words can be transcribed inadequately or may not  be corrected upon review.

## 2023-11-17 ENCOUNTER — Ambulatory Visit (INDEPENDENT_AMBULATORY_CARE_PROVIDER_SITE_OTHER): Payer: Medicare Other | Admitting: Internal Medicine

## 2023-11-17 ENCOUNTER — Encounter: Payer: Self-pay | Admitting: Internal Medicine

## 2023-11-17 VITALS — BP 124/62 | HR 86 | Ht 67.0 in | Wt 200.0 lb

## 2023-11-17 DIAGNOSIS — F419 Anxiety disorder, unspecified: Secondary | ICD-10-CM

## 2023-11-17 DIAGNOSIS — J4530 Mild persistent asthma, uncomplicated: Secondary | ICD-10-CM | POA: Diagnosis not present

## 2023-11-17 DIAGNOSIS — Z09 Encounter for follow-up examination after completed treatment for conditions other than malignant neoplasm: Secondary | ICD-10-CM | POA: Diagnosis not present

## 2023-11-17 DIAGNOSIS — I2699 Other pulmonary embolism without acute cor pulmonale: Secondary | ICD-10-CM

## 2023-11-17 DIAGNOSIS — E039 Hypothyroidism, unspecified: Secondary | ICD-10-CM

## 2023-11-17 DIAGNOSIS — M5137 Other intervertebral disc degeneration, lumbosacral region with discogenic back pain only: Secondary | ICD-10-CM

## 2023-11-17 MED ORDER — ALPRAZOLAM 1 MG PO TABS: 1.0000 mg | ORAL_TABLET | Freq: Two times a day (BID) | ORAL | 0 refills | Status: DC | PRN

## 2023-11-17 MED ORDER — APIXABAN 5 MG PO TABS: 5.0000 mg | ORAL_TABLET | Freq: Two times a day (BID) | ORAL | 5 refills | Status: DC

## 2023-11-17 NOTE — Assessment & Plan Note (Signed)
Has had asthma exacerbation/acute bronchitis recently Completed Medrol dosepak Better with Trelegy now Albuterol PRN for dyspnea or wheezing Has albuterol nebulizer as well as she responds better to nebulization Tussionex PRN for cough Referred to pulmonology

## 2023-11-17 NOTE — Assessment & Plan Note (Signed)
Avoid heavy lifting and frequent bending Advised to use back brace and/or heating pad as needed Norco as needed for severe pain

## 2023-11-17 NOTE — Assessment & Plan Note (Signed)
Hospital chart reviewed, including discharge summary Medications reconciled and reviewed with the patient in detail

## 2023-11-17 NOTE — Assessment & Plan Note (Signed)
H/o panic disorder as well Well-controlled with Xanax Xanax 1 mg BID PRN, takes it only for panic episodes

## 2023-11-17 NOTE — Patient Instructions (Addendum)
Please continue taking Eliquis as prescribed.  Please continue to take other medications as prescribed.  Please continue to follow low salt diet and ambulate as tolerated.

## 2023-11-17 NOTE — Assessment & Plan Note (Signed)
Lab Results  Component Value Date   TSH 1.393 11/03/2023   Was on Levothyroxine 25 mcg once daily, recently discontinued due to elevated free T4 Follows up with Dr Fransico Him Checked TSH and free T4

## 2023-11-17 NOTE — Progress Notes (Signed)
Established Patient Office Visit  Subjective:  Patient ID: [name], female    DOB: 08-04-1951  Age: 73 y.o. MRN: [id number]  CC:  Chief Complaint  Patient presents with   Care Management    Follow up from hospital , d/c on 11/05/23, pt reports feeling weak and dizzy , reports trouble picking up hydrocodone at last visit, states it was never sent to her pharmacy [facility] [place]).     HPI [name] is a 73 y.o. female with past medical history of HTN, GERD, IBS-C, NASH, anal fissure, hypothyroidism, OA, DDD of lumbar spine, anxiety/panic disorder, insomnia, left sided blind eye, G6PD mutation and sickle cell trait who presents for follow-up after recent hospitalization from 11/04/23-11/05/23 for acute PE.  She presented from her cardiology office for chief complaint of shortness of breath. Reporting progressive shortness of breath x 1 month and mild tightness in her chest with cough.  She had cough with yellowish expectoration, for which she was treated with antibiotic in the outpatient setting considering acute bronchitis.  She had an episode of syncope a week prior to ER visit.  Her CTA chest showed multiple bilateral pulmonary emboli with right heart strain.  Urgent echocardiogram was done, which showed moderately reduced RV systolic function.  She was placed on heparin drip, pulmonology was consulted.  She was later switched to oral DOAC - Eliquis.  Denies any episode of bleeding currently.  She still complains of chest pain episodes, which are dull, intermittent, but denies overt dyspnea or palpitations.  No report of fall/syncope since being discharged.  She still feels weak, but denies any fever, chills, hemoptysis, night sweats or LAD.    Past Medical History:  Diagnosis Date   Anal fissure    Anxiety    Arthritis    Asthma    C. difficile diarrhea 09/17/2021   Cataract    Phreesia 01/05/2021   Chest pain    a. 02/2000 Cath: nl cors, EF 60%;  b. 10/2010 MV: nl LV, no  ischemia/infarct;  c. 03/2014 Admit c/p, r/o->grief from husbands death.   Chronic RUQ pain 2007/12/12   EUS slightly dilated CBD (7.81mm), otherwise nl   Depression    Diverticulosis    Eczema    Exposure to chemical inhalation mid 1970s   Fatty liver    G6PD deficiency    Gallstones    GERD (gastroesophageal reflux disease)    Glaucoma    History of pulmonary embolus (PE)    Treated with Eliquis 2014-12-11   HTN (hypertension)    Hypercholesteremia    a. intolerant to statins.   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 She has limited vision in the right eye.   Migraines    inner ocular   Ocular migraine    Palpitations    Pleurisy    Pneumonia 07/16/2021   Patient was exposed to a harsh chemical in the 1970's that created scar tissue in her lungs. She has had pneumonia several times in the past.   PONV (postoperative nausea and vomiting)    Pulmonary emboli (HCC)    Recurrent upper respiratory infection (URI)    Renal cyst    Retinal cyst    Sickle cell trait (HCC)    Tubular adenoma of colon 09/17/2011   Urticaria     Past Surgical History:  Procedure Laterality Date  ABDOMINAL HYSTERECTOMY     ADENOIDECTOMY     ANGIOPLASTY     APPENDECTOMY     BIOPSY N/A 03/14/2013   Procedure: SMALL BOWEL AND GASTRIC BIOPSIES (Procedure #1);  Surgeon: West Bali, MD;  Location: [facility];  Service: Endoscopy;  Laterality: N/A;   CARDIAC CATHETERIZATION Left 02/11/2000   CATARACT EXTRACTION Left    CATARACT EXTRACTION W/PHACO Right 03/19/2021   Procedure: CATARACT EXTRACTION PHACO AND INTRAOCULAR LENS PLACEMENT (IOC) RIGHT 6.75 00:50.4;  Surgeon: Lockie Mola, MD;  Location: [facility];  Service: Ophthalmology;  Laterality: Right;   CHOLECYSTECTOMY  12/2007   COLONOSCOPY  12/22/2010   DGU:YQIHKV, cecal adenomatous polyp   COLONOSCOPY WITH PROPOFOL N/A 03/31/2016   Dr. Darrick Penna: four  sessile polyps rectum/sigmoid colon, diverticulosi, ext/int hemorrhoids. hyperplastic polyps, next tcs 5 years.   COLONOSCOPY WITH PROPOFOL N/A 04/21/2021   anal fissure, non-bleeding internal hemorrhoids, right colon diverticulosis, one 2 mm polyp in ascending, three 4-6 mm polyps in descending colon. Tubular adenomas. 5 year surveillance.   complete hysterectomy     ENTEROSCOPY N/A 03/14/2013   QQV:ZDGL gastritis/ulcers has healed   ESOPHAGOGASTRODUODENOSCOPY  09/22/2011   OVF:IEPP gastritis/Duodenitis   EXCISIONAL HEMORRHOIDECTOMY     FLEXIBLE SIGMOIDOSCOPY N/A 03/14/2013   SLF:3 colon polyp removed/moderate sized internal hemorrhoids   FLEXIBLE SIGMOIDOSCOPY N/A 06/09/2016   Procedure: FLEXIBLE SIGMOIDOSCOPY;  Surgeon: West Bali, MD;  [facility];  Service: Endoscopy;  Laterality: N/A;  rectal polyps times 2   FLEXIBLE SIGMOIDOSCOPY N/A 03/18/2018   Procedure: FLEXIBLE SIGMOIDOSCOPY;  Surgeon: West Bali, MD;  [facility]: [facility];  Service: Endoscopy;  Laterality: N/A;  9:15am   FOOT SURGERY     bunion removal left   GIVENS CAPSULE STUDY N/A 02/10/2013   Procedure: GIVENS CAPSULE STUDY;  Surgeon: West Bali, MD;  Location: [facility];  Service: Endoscopy;  Laterality: N/A;  730   HEEL SPUR EXCISION     right    HEMORRHOID BANDING N/A 03/14/2013   Procedure: HEMORRHOID BANDING (Procedure #3)  3 bands applied IRJ#[id number] Exp 02/02/2014 ;  Surgeon: West Bali, MD;  Location: [facility];  Service: Endoscopy;  Laterality: N/A;   HEMORRHOID SURGERY N/A 04/24/2016   Procedure: EXTENSIVE HEMORRHOIDECTOMY;  Surgeon: Ancil Linsey, MD;  Location: [facility];  Service: General;  Laterality: N/A;   LAPAROSCOPY     adhesions   POLYPECTOMY N/A 03/14/2013   YTK:ZSWF Gastritis . ULCERS SEEN ON MAY 6 HAVE HEALED   POLYPECTOMY  03/31/2016   Procedure: POLYPECTOMY;  Surgeon: West Bali, MD;  [facility];  Service: Endoscopy;;  sigmoid colon polyps x2,  rectal polyps x2   POLYPECTOMY  04/21/2021   Procedure: POLYPECTOMY;  Surgeon: Lanelle Bal, DO;  Location: [facility];  Service: Endoscopy;;   RECTAL EXAM UNDER ANESTHESIA N/A 07/23/2021   Procedure: RECTAL EXAM UNDER ANESTHESIA;  Surgeon: Romie Levee, MD;  Location: [facility];  Service: General;  Laterality: N/A;   SPHINCTEROTOMY N/A 07/23/2021   Procedure: CHEMICAL SPHINCTEROTOMY;  Surgeon: Romie Levee, MD;  Location: [facility];  Service: General;  Laterality: N/A;   TONSILLECTOMY     TUBAL LIGATION      Family History  Problem Relation Age of Onset   Colon cancer Mother        31s   Urticaria Mother    Heart disease Mother    Colon polyps Mother    Cancer Father  oral   Crohn's disease Sister    Cancer Sister        around heart   Colon polyps Sister    Diabetes Brother    Other Brother        intestinal sugery   Colon polyps Brother    Colon cancer Maternal Grandmother    Colon cancer Maternal Grandfather    Colon cancer Paternal Grandfather    Anesthesia problems Neg Hx     Social History   Socioeconomic History   Marital status: Widowed    Spouse name: Not on file   Number of children: 1   Years of education: Not on file   Highest education level: Not on file  Occupational History   Occupation: homemaker    Employer: UNEMPLOYED  Tobacco Use   Smoking status: Former    Current packs/day: 0.00    Average packs/day: 0.5 packs/day for 30.0 years (15.0 ttl pk-yrs)    Types: Cigarettes    Start date: 10/15/1966    Quit date: 10/15/1996    Years since quitting: 27.1   Smokeless tobacco: Never   Tobacco comments:    Stopped smoking ~ 2000  Vaping Use   Vaping status: Never Used  Substance and Sexual Activity   Alcohol use: Not Currently    Alcohol/week: 4.0 standard drinks of alcohol    Types: 4 Glasses of wine per week    Comment: 1 drinks every couple of weeks   Drug use: No   Sexual activity: Not  Currently    Birth control/protection: Surgical  Other Topics Concern   Not on file  Social History Narrative   Does not routinely exercise. Husband passed in JUN 2015 due to prostate ca.   Social Drivers of Corporate investment banker Strain: Low Risk  (06/11/2023)   Overall Financial Resource Strain (CARDIA)    Difficulty of Paying Living Expenses: Not hard at all  Food Insecurity: No Food Insecurity (11/04/2023)   Hunger Vital Sign    Worried About Running Out of Food in the Last Year: Never true    Ran Out of Food in the Last Year: Never true  Transportation Needs: No Transportation Needs (11/04/2023)   PRAPARE - Administrator, Civil Service (Medical): No    Lack of Transportation (Non-Medical): No  Physical Activity: Sufficiently Active (06/11/2023)   Exercise Vital Sign    Days of Exercise per Week: 6 days    Minutes of Exercise per Session: 30 min  Stress: No Stress Concern Present (06/11/2023)   [employer] of Occupational Health - Occupational Stress Questionnaire    Feeling of Stress : Not at all  Social Connections: Socially Isolated (11/04/2023)   Social Connection and Isolation Panel [NHANES]    Frequency of Communication with Friends and Family: Three times a week    Frequency of Social Gatherings with Friends and Family: Once a week    Attends Religious Services: Never    Database administrator or Organizations: No    Attends Banker Meetings: Never    Marital Status: Widowed  Intimate Partner Violence: Not At Risk (11/04/2023)   Humiliation, Afraid, Rape, and Kick questionnaire    Fear of Current or Ex-Partner: No    Emotionally Abused: No    Physically Abused: No    Sexually Abused: No    Outpatient Medications Prior to Visit  Medication Sig Dispense Refill   albuterol (PROVENTIL) (2.5 MG/3ML) 0.083% nebulizer solution Take 3 mLs (2.5  mg total) by nebulization every 4 (four) hours as needed for wheezing or shortness of breath. 150 mL  3   APIXABAN (ELIQUIS) VTE STARTER PACK (10MG  AND 5MG ) Take as directed on package: start with two-5mg  tablets twice daily for 7 days. On day 8, switch to one-5mg  tablet twice daily. 74 each 0   brimonidine (ALPHAGAN) 0.2 % ophthalmic solution Place 1 drop into both eyes 2 (two) times daily. 15 mL 3   chlorpheniramine-HYDROcodone (TUSSIONEX) 10-8 MG/5ML Take 5 mLs by mouth every 12 (twelve) hours as needed for cough. 115 mL 0   Cholecalciferol (VITAMIN D3) 50 MCG (2000 UT) TABS Take 2,000 Units by mouth daily.     dexlansoprazole (DEXILANT) 60 MG capsule Take 1 capsule (60 mg total) by mouth daily. 90 capsule 3   fenofibrate (TRICOR) 145 MG tablet Take 1 tablet (145 mg total) by mouth daily. 90 tablet 3   Fluticasone-Umeclidin-Vilant (TRELEGY ELLIPTA) 100-62.5-25 MCG/ACT AEPB Inhale 1 puff into the lungs daily. 3 each 4   HYDROcodone-acetaminophen (NORCO/VICODIN) 5-325 MG tablet Take 1 tablet by mouth every 8 (eight) hours as needed for moderate pain (pain score 4-6) or severe pain (pain score 7-10).     irbesartan (AVAPRO) 150 MG tablet Take 0.5 tablets (75 mg total) by mouth daily. 45 tablet 1   latanoprost (XALATAN) 0.005 % ophthalmic solution Place 1 drop into both eyes at bedtime. 9 mL 3   Magnesium 500 MG TABS Take 1 tablet by mouth 3 (three) times daily with meals.     meclizine (ANTIVERT) 12.5 MG tablet Take 1 tablet (12.5 mg total) by mouth 3 (three) times daily as needed for dizziness. 30 tablet 3   nitroGLYCERIN (NITROLINGUAL) 0.4 MG/SPRAY spray Place 1 spray under the tongue every 5 (five) minutes as needed for chest pain. 12 g 3   NON FORMULARY Take 1 tablet by mouth daily as needed (Stomach Bloat). FD GUARD     Nutritional Supplements (IBS SUPPORT PO) Take 1 tablet by mouth daily as needed (stomach aches). FD Guard and IB Guard Peppermint/Fennel seeds     ondansetron (ZOFRAN) 4 MG tablet TAKE ONE TABLET BY MOUTH EVERY 8 HOURS AS NEEDED FOR NAUSEA OR VOMITING 30 tablet 1    Phenyleph-Doxylamine-DM-APAP (TYLENOL COLD/FLU/COUGH NIGHT PO) Take 2 tablets by mouth daily as needed (cough/cold).     Probiotic Product (PROBIOTIC DAILY PO) Take 1 capsule by mouth daily. VSL #3     rizatriptan (MAXALT-MLT) 10 MG disintegrating tablet DISSOLVE ONE TABLET ON TONGUE AS NEEDED FOR MIGRAINE. MAY REPEAT IN 2 HOURS IF NEEDED 9 tablet 6   zolpidem (AMBIEN) 10 MG tablet Take 1 tablet (10 mg total) by mouth at bedtime as needed for sleep. 90 tablet 1   ALPRAZolam (XANAX) 1 MG tablet Take 1 tablet (1 mg total) by mouth 2 (two) times daily as needed for anxiety. (Patient taking differently: Take 0.5-1 mg by mouth at bedtime.) 180 tablet 3   No facility-administered medications prior to visit.    Allergies  Allergen Reactions   Nutmeg Oil (Myristica Oil) Anaphylaxis    Nut oil- skin irritation   Adhesive [Tape] Other (See Comments)    Blisters skin   Aspirin Other (See Comments)    sickle cell trait--  Not recommended unless emergency   Imitrex [Sumatriptan] Hives   Keflex [Cephalexin] Other (See Comments)    G6PD   Levaquin [Levofloxacin In D5w]     Altered mental status Affects G6PD   Other  Nut Oil- skin irritation    Statins Other (See Comments)    Myopathy - elevated CK   Sulfa Antibiotics Other (See Comments)    Patient has sickle cell trait   Sulfonamide Derivatives Other (See Comments)    Patient has sickle cell trait   Latex Rash    ROS Review of Systems  Constitutional:  Negative for chills and fever.  HENT:  Positive for congestion and sinus pressure. Negative for sore throat.   Eyes:  Positive for photophobia, pain and visual disturbance. Negative for discharge.  Respiratory:  Positive for cough. Negative for shortness of breath and wheezing.   Cardiovascular:  Positive for chest pain. Negative for palpitations.  Gastrointestinal:  Positive for constipation. Negative for abdominal pain, blood in stool, diarrhea, nausea and vomiting.  Endocrine:  Negative for polydipsia and polyuria.  Genitourinary:  Negative for dysuria and hematuria.  Musculoskeletal:  Positive for arthralgias, back pain, neck pain and neck stiffness.  Skin:  Negative for rash.  Neurological:  Positive for headaches. Negative for weakness.  Psychiatric/Behavioral:  Negative for agitation and behavioral problems.       Objective:    Physical Exam Vitals reviewed.  Constitutional:      General: She is not in acute distress.    Appearance: She is not diaphoretic.  HENT:     Head: Normocephalic and atraumatic.     Nose: Congestion present.     Mouth/Throat:     Mouth: Mucous membranes are moist.  Eyes:     General: No scleral icterus.    Extraocular Movements: Extraocular movements intact.  Cardiovascular:     Rate and Rhythm: Normal rate and regular rhythm.     Heart sounds: Normal heart sounds. No murmur heard. Pulmonary:     Breath sounds: Normal breath sounds. No wheezing or rales.  Musculoskeletal:        General: Tenderness (Lower lumbar spine area) present.     Left hand: Decreased strength of thumb/finger opposition.     Cervical back: Neck supple. No tenderness.     Right lower leg: No edema.     Left lower leg: No edema.  Skin:    General: Skin is warm.     Findings: No rash.  Neurological:     General: No focal deficit present.     Mental Status: She is alert and oriented to person, place, and time.     Sensory: No sensory deficit.     Motor: No weakness.  Psychiatric:        Mood and Affect: Mood is anxious.        Behavior: Behavior normal.     BP 124/62   Pulse 86   Ht 5\' 7"  (1.702 m)   Wt 200 lb (90.7 kg)   SpO2 93%   BMI 31.32 kg/m  Wt Readings from Last 3 Encounters:  11/17/23 200 lb (90.7 kg)  11/10/23 198 lb 6.4 oz (90 kg)  11/05/23 208 lb 1.8 oz (94.4 kg)    Lab Results  Component Value Date   TSH 1.393 11/03/2023   Lab Results  Component Value Date   WBC 6.2 11/05/2023   HGB 12.4 11/05/2023   HCT 38.3  11/05/2023   MCV 100.0 11/05/2023   PLT 207 11/05/2023   Lab Results  Component Value Date   NA 136 11/05/2023   K 3.8 11/05/2023   CO2 20 (L) 11/05/2023   GLUCOSE 97 11/05/2023   BUN 14 11/05/2023   CREATININE 1.18 (H)  11/05/2023   BILITOT 1.8 (H) 11/03/2023   ALKPHOS 29 (L) 11/03/2023   AST 29 11/03/2023   ALT 17 11/03/2023   PROT 7.1 11/03/2023   ALBUMIN 3.8 11/03/2023   CALCIUM 9.5 11/05/2023   ANIONGAP 10 11/05/2023   EGFR 47 (L) 02/01/2023   Lab Results  Component Value Date   CHOL 176 11/03/2023   Lab Results  Component Value Date   HDL 22 (L) 11/03/2023   Lab Results  Component Value Date   LDLCALC 119 (H) 11/03/2023   Lab Results  Component Value Date   TRIG 173 (H) 11/03/2023   Lab Results  Component Value Date   CHOLHDL 8.0 11/03/2023   Lab Results  Component Value Date   HGBA1C 5.1 (A) 11/10/2023      Assessment & Plan:   Problem List Items Addressed This Visit       Cardiovascular and Mediastinum   Recurrent pulmonary embolism (HCC) - Primary   Was treated with IV heparin drip, followed by Eliquis starter pack Continue Eliquis 5 mg twice daily Check CBC today Planned to see Heme./Onc.  Considering recurrent pulmonary embolism, history of G6PD mutation and sickle cell trait Referred to pulmonology for recurrent PE and asthma      Relevant Medications   apixaban (ELIQUIS) 5 MG TABS tablet   Other Relevant Orders   Ambulatory referral to Pulmonology   CBC with Differential/Platelet   Basic Metabolic Panel (BMET)     Respiratory   Asthma   Has had asthma exacerbation/acute bronchitis recently Completed Medrol dosepak Better with Trelegy now Albuterol PRN for dyspnea or wheezing Has albuterol nebulizer as well as she responds better to nebulization Tussionex PRN for cough Referred to pulmonology      Relevant Orders   Ambulatory referral to Pulmonology     Endocrine   Hypothyroidism   Lab Results  Component Value Date    TSH 1.393 11/03/2023   Was on Levothyroxine 25 mcg once daily, recently discontinued due to elevated free T4 Follows up with Dr Fransico Him Checked TSH and free T4        Musculoskeletal and Integument   DDD (degenerative disc disease), lumbosacral   Avoid heavy lifting and frequent bending Advised to use back brace and/or heating pad as needed Norco as needed for severe pain      Relevant Medications   HYDROcodone-acetaminophen (NORCO/VICODIN) 5-325 MG tablet     Other   Anxiety (Chronic)   H/o panic disorder as well Well-controlled with Xanax Xanax 1 mg BID PRN, takes it only for panic episodes      Relevant Medications   ALPRAZolam (XANAX) 1 MG tablet   Hospital discharge follow-up   Hospital chart reviewed, including discharge summary Medications reconciled and reviewed with the patient in detail       Meds ordered this encounter  Medications   apixaban (ELIQUIS) 5 MG TABS tablet    Sig: Take 1 tablet (5 mg total) by mouth 2 (two) times daily.    Dispense:  60 tablet    Refill:  5    Patient has started pack from local pharmacy, will need 5 mg dose after completing started pack.   ALPRAZolam (XANAX) 1 MG tablet    Sig: Take 1 tablet (1 mg total) by mouth 2 (two) times daily as needed for anxiety.    Dispense:  180 tablet    Refill:  0    Follow-up: Return if symptoms worsen or fail to improve.  Anabel Halon, MD

## 2023-11-17 NOTE — Assessment & Plan Note (Signed)
Was treated with IV heparin drip, followed by Eliquis starter pack Continue Eliquis 5 mg twice daily Check CBC today Planned to see Heme./Onc.  Considering recurrent pulmonary embolism, history of G6PD mutation and sickle cell trait Referred to pulmonology for recurrent PE and asthma

## 2023-11-18 ENCOUNTER — Ambulatory Visit (INDEPENDENT_AMBULATORY_CARE_PROVIDER_SITE_OTHER): Payer: Medicare Other | Admitting: Internal Medicine

## 2023-11-18 ENCOUNTER — Telehealth: Payer: Self-pay | Admitting: Internal Medicine

## 2023-11-18 ENCOUNTER — Encounter: Payer: Self-pay | Admitting: Internal Medicine

## 2023-11-18 ENCOUNTER — Other Ambulatory Visit: Payer: Self-pay

## 2023-11-18 VITALS — BP 133/74 | HR 90 | Ht 67.0 in | Wt 199.0 lb

## 2023-11-18 DIAGNOSIS — I2699 Other pulmonary embolism without acute cor pulmonale: Secondary | ICD-10-CM | POA: Diagnosis not present

## 2023-11-18 LAB — CBC WITH DIFFERENTIAL/PLATELET
Basophils Absolute: 0.1 10*3/uL (ref 0.0–0.2)
Basos: 1 %
EOS (ABSOLUTE): 0.3 10*3/uL (ref 0.0–0.4)
Eos: 5 %
Hematocrit: 39.8 % (ref 34.0–46.6)
Hemoglobin: 13 g/dL (ref 11.1–15.9)
Immature Grans (Abs): 0 10*3/uL (ref 0.0–0.1)
Immature Granulocytes: 0 %
Lymphocytes Absolute: 2.1 10*3/uL (ref 0.7–3.1)
Lymphs: 34 %
MCH: 31 pg (ref 26.6–33.0)
MCHC: 32.7 g/dL (ref 31.5–35.7)
MCV: 95 fL (ref 79–97)
Monocytes Absolute: 0.5 10*3/uL (ref 0.1–0.9)
Monocytes: 7 %
Neutrophils Absolute: 3.3 10*3/uL (ref 1.4–7.0)
Neutrophils: 53 %
Platelets: 246 10*3/uL (ref 150–450)
RBC: 4.19 x10E6/uL (ref 3.77–5.28)
RDW: 11.9 % (ref 11.7–15.4)
WBC: 6.3 10*3/uL (ref 3.4–10.8)

## 2023-11-18 LAB — BASIC METABOLIC PANEL
BUN/Creatinine Ratio: 15 (ref 12–28)
BUN: 16 mg/dL (ref 8–27)
CO2: 17 mmol/L — ABNORMAL LOW (ref 20–29)
Calcium: 10.3 mg/dL (ref 8.7–10.3)
Chloride: 105 mmol/L (ref 96–106)
Creatinine, Ser: 1.1 mg/dL — ABNORMAL HIGH (ref 0.57–1.00)
Glucose: 103 mg/dL — ABNORMAL HIGH (ref 70–99)
Potassium: 4.4 mmol/L (ref 3.5–5.2)
Sodium: 141 mmol/L (ref 134–144)
eGFR: 54 mL/min/{1.73_m2} — ABNORMAL LOW (ref >59)

## 2023-11-18 MED ORDER — APIXABAN 5 MG PO TABS
5.0000 mg | ORAL_TABLET | Freq: Two times a day (BID) | ORAL | 1 refills | Status: DC
Start: 2023-11-18 — End: 2024-01-27

## 2023-11-18 NOTE — Assessment & Plan Note (Signed)
Dx 11/04/23 with R ht strain and bilateral DVT in setting of prior PE and longstand chronic non-specfic resp cc -  11/18/2023   Walked on RA   x  3  lap(s) =  approx 450  ft  @ mod pace, stopped due to end of study  with lowest 02 sats 97% talking the entire time with peak HR 104   Based on the hx I attempted to obtain today, even in retrospect it's hard to tell when she started having DVT (which were asymptomatic) or PE ( which is not typically a chronic dx) so I'm not sure now which of her chronic symptoms will resolve but one of them, doe, seems much improved over what she described chronically suggesting a component of decondtioning vs underlying CTEPH from 1st episode of PE so rec   Lifelong doac as planned   If any interruption of doe needed for elective procedure, would repeat echo and venous dopplers to assess risk of PE / R ht failure vs need for procedure.   If symptoms of cough or dyspnea persist beyond 3 months of doac, I would like another chance to eval her for question of CTEPH (which would require v/q this time)  or for that matter any other chronic pulmonary disorder but need to fully treat the acute one 1st.  Discussed in detail all the  indications, usual  risks and alternatives  relative to the benefits with patient who agrees to proceed with Rx as outlined.      Each maintenance medication was reviewed in detail including emphasizing most importantly the difference between maintenance and prns and under what circumstances the prns are to be triggered using an action plan format where appropriate.  Total time for H and P, chart review, counseling,  directly observing portions of ambulatory 02 saturation study/ and generating customized AVS unique to this office visit / same day charting = 60 min complex post hosp f/u pt

## 2023-11-18 NOTE — Telephone Encounter (Signed)
Patient came in the office requesting  3 month supply at a time due [facility].  Per patient she takes bid. Patient does not want to run short.  She said it takes at least 21 days the time [facility] receives it and send to her.   apixaban (ELIQUIS) 5 MG TABS tablet [[id number]]   Pharmacy  [facility] - [place], [place] - [address] [facility] [address] [place], [place] [address] Phone: [phone]  Fax: [phone]    Patient requesting a call back at [phone] once this has been sent to pharmacy.

## 2023-11-18 NOTE — Patient Instructions (Addendum)
Let Dr Kirtland Bouchard refill your medications.  GERD (REFLUX)  is an extremely common cause of respiratory symptoms just like yours , many times with no obvious heartburn at all.    It can be treated with medication, but also with lifestyle changes including elevation of the head of your bed (ideally with 6 -8inch blocks under the headboard of your bed),  Smoking cessation, avoidance of late meals, excessive alcohol, and avoid fatty foods, chocolate, peppermint, colas, red wine, and acidic juices such as orange juice.  NO MINT OR MENTHOL PRODUCTS SO NO COUGH DROPS - LUDENS  USE SUGARLESS CANDY INSTEAD (Jolley ranchers or [employer] or Environmental manager) or even ice chips will also do - the key is to swallow to prevent all throat clearing. NO OIL BASED VITAMINS - use powdered substitutes.  Avoid fish oil when coughing.   You will need a repeat Echo and venous dopplers before any elective invasive procedures so we can clear you for elective procedures at least for the next 3 months    Pulmonary follow up is for unexplained breathlessness or cough

## 2023-11-18 NOTE — Progress Notes (Signed)
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 . Presented today from her doctor office for chief complaint of shortness of breath.  Reporting progressive shortness of breath x 1 month and mild tightness in her chest with cough.  Cough is sometimes productive with yellowish sputum. She had an episode of syncopal episode a week ago,  Saw her cardiologist today who referred her to ED for further evaluation with a history of PE, syncope and now shortness of breath.     ED evaluation/course: Blood pressure 121/60, pulse 88, temperature 98.2 F (36.8 C), RR 20,  weight 92.2 kg, SpO2 96%.  On room air   CBC CMP reviewed all within normal limits with exception of sodium 134, CO2 of 19, creatinine 1.16, Troponin 12, 10, BNP 120, magnesium 1.5   XBM:[id number] bilateral pulmonary emboli. CT evidence of right heart strain (RV/LV Ratio = 1.45) consistent with at  least submassive (intermediate risk) PE   Patient was started on heparin drip EDP Dr. Charm Barges has discussed the case with PCCM Dr. Kerin Perna recommend urgent echocardiogram Does not recommend emergent thrombectomy and transfer   ------------------------------------------------------------------------------------------------------------------------ * Recurrent pulmonary embolism (HCC) -Patient was observed closely in ICU setting, remained hemodynamically stable  Denies any chest pain, satting 96% on room air, blood pressure stable -Continue heparin drip -CTA reviewed, revealing bilateral multiple recurrent pulmonary embolism-with possible right heart strain   -Consulted pulmonologist Dr. Michae Kava  Discussed the case in detail does not recommend transfer or thrombectomy at this point. Reviewed echo as below Recommended to continue heparin and transition to DOAC in 24 hours No events overnight patient successfully transition to Eliquis orally on 11/05/2023   - 2D echocardiogram: ED JF 70-75%, mild LVH, grade 1 diastolic dysfunction, right ventricle systolic function is moderately reduced, right ventricular size moderately enlarged, small pericardial effusion, valves appear normal   -Discussed with hematologist Dr. Ellin Saba, agree with current treatment plan including Eliquis.  Patient will follow-up with him on outpatient setting.      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 - Stable follow-up with PCP as an outpatient     DDD (degenerative disc disease), lumbosacral - Stable will monitor her home narcotics -Will be mindful analgesics as patient will be on heparin drip high risk of bleeding   Depression - Mood stable, continue home medication -Currently not on any SSRIs  Anxiety - Home medication reviewed will be continued accordingly currently patient is on 1 mg Xanax twice daily as needed   GERD (gastroesophageal reflux disease) - Continue  PPI   Chronic insomnia - Stable continue home medication of Ambien   Asthma -No signs of exacerbation, will continue to monitor, continue inhalers supplemental oxygen as needed   Spinal stenosis at L4-L5 level - History of back pain including spinal stenosis -Currently stable -Continue home dose analgesics with as needed IV meds   Primary open angle glaucoma (POAG) of both eyes - Stable continue home meds   Nonalcoholic steatohepatitis (NASH) - Holding any hepatotoxic medications (including statins) Monitoring LFTs   Hyperlipidemia - Patient not on statins, will continue home medication of Zetia, fenofibrate   Morbid obesity (HCC) Body mass index is 31.83 kg/m. -Discussed regarding healthy diet, exercise   Essential hypertension, benign - BP stable-current home medications reviewed no BP meds on board -we will verify       History of Present Illness  11/18/2023  Pulmonary/ 1st office eval/ [name] / [place] [facility]  Chief Complaint  Patient presents with   Establish Care   Recurrent Pulmonary Embolisms   Dyspnea:  not back to baseline x years but presently sob room to room at home  Cough: x one year dry  Sleep: on side bed is flat / one pillow  SABA use: not needing now  02: none    No obvious day to day or daytime pattern/variability or assoc excess/ purulent sputum or mucus plugs or hemoptysis or  chest tightness, subjective wheeze or overt sinus or hb symptoms.    Also denies any obvious fluctuation of symptoms with weather or environmental changes or other aggravating or alleviating factors except as outlined above   No unusual exposure hx or h/o childhood pna/ asthma or knowledge of premature birth.  Current Allergies, Complete Past Medical History, Past Surgical History, Family History, and Social History were reviewed in [employer] record.  ROS  The following are not active complaints unless bolded Hoarseness, sore throat,  dysphagia, dental problems, itching, sneezing,  nasal congestion or discharge of excess mucus or purulent secretions, ear ache,   fever, chills, sweats, unintended wt loss or wt gain, classically pleuritic or exertional cp,  orthopnea pnd or arm/hand swelling  or leg swelling, presyncope, palpitations, abdominal pain, anorexia, nausea, vomiting, diarrhea  or change in bowel habits or change in bladder habits, change in stools or change in urine, dysuria, hematuria,  rash, arthralgias, visual complaints, headache, numbness, weakness or ataxia or problems with walking or coordination,  change in mood or  memory.            Outpatient Medications Prior to Visit  Medication Sig Dispense Refill   albuterol (PROVENTIL) (2.5 MG/3ML) 0.083% nebulizer solution Take 3 mLs (2.5 mg total) by nebulization every 4 (four) hours as needed for wheezing or shortness of breath. 150 mL 3   ALPRAZolam (XANAX) 1 MG tablet Take 1 tablet (1 mg total) by mouth 2 (two) times daily as needed for anxiety. 180 tablet 0   brimonidine (ALPHAGAN) 0.2 % ophthalmic solution Place 1 drop into both eyes 2 (two) times daily. 15 mL 3   chlorpheniramine-HYDROcodone (TUSSIONEX) 10-8 MG/5ML Take 5 mLs by mouth every 12 (twelve) hours as needed for cough. 115 mL 0   Cholecalciferol (VITAMIN D3) 50 MCG (2000 UT) TABS Take 2,000 Units by mouth daily.     dexlansoprazole (DEXILANT) 60 MG capsule Take 1 capsule (  60 mg total) by mouth daily. 90 capsule 3   fenofibrate (TRICOR) 145 MG tablet Take 1 tablet (145 mg total) by mouth daily. 90 tablet 3   Fluticasone-Umeclidin-Vilant (TRELEGY ELLIPTA) 100-62.5-25 MCG/ACT AEPB Inhale 1 puff into the lungs daily. 3 each 4   HYDROcodone-acetaminophen (NORCO/VICODIN) 5-325 MG tablet Take 1 tablet by mouth every 8 (eight) hours as needed for moderate pain (pain score 4-6) or severe pain (pain score 7-10).     irbesartan (AVAPRO) 150 MG tablet Take 0.5 tablets (75 mg total) by mouth daily. 45 tablet 1    latanoprost (XALATAN) 0.005 % ophthalmic solution Place 1 drop into both eyes at bedtime. 9 mL 3   Magnesium 500 MG TABS Take 1 tablet by mouth 3 (three) times daily with meals.     meclizine (ANTIVERT) 12.5 MG tablet Take 1 tablet (12.5 mg total) by mouth 3 (three) times daily as needed for dizziness. 30 tablet 3   nitroGLYCERIN (NITROLINGUAL) 0.4 MG/SPRAY spray Place 1 spray under the tongue every 5 (five) minutes as needed for chest pain. 12 g 3   NON FORMULARY Take 1 tablet by mouth daily as needed (Stomach Bloat). FD GUARD     Nutritional Supplements (IBS SUPPORT PO) Take 1 tablet by mouth daily as needed (stomach aches). FD Guard and IB Guard Peppermint/Fennel seeds     ondansetron (ZOFRAN) 4 MG tablet TAKE ONE TABLET BY MOUTH EVERY 8 HOURS AS NEEDED FOR NAUSEA OR VOMITING 30 tablet 1   Phenyleph-Doxylamine-DM-APAP (TYLENOL COLD/FLU/COUGH NIGHT PO) Take 2 tablets by mouth daily as needed (cough/cold).     Probiotic Product (PROBIOTIC DAILY PO) Take 1 capsule by mouth daily. VSL #3     rizatriptan (MAXALT-MLT) 10 MG disintegrating tablet DISSOLVE ONE TABLET ON TONGUE AS NEEDED FOR MIGRAINE. MAY REPEAT IN 2 HOURS IF NEEDED 9 tablet 6   zolpidem (AMBIEN) 10 MG tablet Take 1 tablet (10 mg total) by mouth at bedtime as needed for sleep. 90 tablet 1   apixaban (ELIQUIS) 5 MG TABS tablet Take 1 tablet (5 mg total) by mouth 2 (two) times daily. 60 tablet 5   APIXABAN (ELIQUIS) VTE STARTER PACK (10MG  AND 5MG ) Take as directed on package: start with two-5mg  tablets twice daily for 7 days. On day 8, switch to one-5mg  tablet twice daily. 74 each 0   No facility-administered medications prior to visit.    Past Medical History:  Diagnosis Date   Anal fissure    Anxiety    Arthritis    Asthma    C. difficile diarrhea 09/17/2021   Cataract    [employer]ia 01/05/2021   Chest pain    a. 02/2000 Cath: nl cors, EF 60%;  b. 10/2010 MV: nl LV, no ischemia/infarct;  c. 03/2014 Admit c/p, r/o->grief from husbands  death.   Chronic RUQ pain 12/09/2007   EUS slightly dilated CBD (7.58mm), otherwise nl   Depression    Diverticulosis    Eczema    Exposure to chemical inhalation mid 1970s   Fatty liver    G6PD deficiency    Gallstones    GERD (gastroesophageal reflux disease)    Glaucoma    History of pulmonary embolus (PE)    Treated with Eliquis 2014/12/08   HTN (hypertension)    Hypercholesteremia    a. intolerant to statins.   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  She has limited vision in the right eye.   Migraines    inner ocular   Ocular migraine    Palpitations    Pleurisy    Pneumonia 07/16/2021   Patient was exposed to a harsh chemical in the 1970's that created scar tissue in her lungs. She has had pneumonia several times in the past.   PONV (postoperative nausea and vomiting)    Pulmonary emboli (HCC)    Recurrent upper respiratory infection (URI)    Renal cyst    Retinal cyst    Sickle cell trait (HCC)    Tubular adenoma of colon 09/17/2011   Urticaria       Objective:     BP 133/74   Pulse 90   Ht 5\' 7"  (1.702 m)   Wt 199 lb (90.3 kg)   SpO2 94%   BMI 31.17 kg/m   SpO2: 94 % RA  Amb wf jumping from symptom to symptoms dating back years instead of focusing on present ccs   HEENT : Oropharynx  clear      Nasal turbinates nl    NECK :  without  apparent JVD/ palpable Nodes/TM    LUNGS: no acc muscle use,  Nl contour chest which is clear to A and P bilaterally without cough on insp or exp maneuvers   CV:  RRR  no s3 or murmur or increase in P2, and no edema   ABD:  soft and nontender   MS:  Gait nl   ext warm without deformities Or obvious joint restrictions  calf tenderness, cyanosis or clubbing    SKIN: warm and dry without lesions    NEURO:  alert, approp, nl sensorium with  no motor or cerebellar deficits apparent.       I personally reviewed images and agree with  radiology impression as follows:   Chest CTa  11/04/23     Multiple bilateral pulmonary emboli. CT evidence of right heart strain (RV/LV Ratio = 1.45) consistent with at least submassive (intermediate risk) PE.   Borderline heart size.  Small pericardial effusion.    Assessment   Bilateral pulmonary embolism (HCC) Dx 11/04/23 with R ht strain and bilateral DVT in setting of prior PE and longstand chronic non-specfic resp cc -  11/18/2023   Walked on RA   x  3  lap(s) =  approx 450  ft  @ mod pace, stopped due to end of study  with lowest 02 sats 97% talking the entire time with peak HR 104   Based on the hx I attempted to obtain today, even in retrospect it's hard to tell when she started having DVT (which were asymptomatic) or PE ( which is not typically a chronic dx) so I'm not sure now which of her chronic symptoms will resolve but one of them, doe, seems much improved over what she described chronically suggesting a component of decondtioning vs underlying CTEPH from 1st episode of PE so rec   Lifelong doac as planned   If any interruption of doe needed for elective procedure, would repeat echo and venous dopplers to assess risk of PE / R ht failure vs need for procedure.   If symptoms of cough or dyspnea persist beyond 3 months of doac, I would like another chance to eval her for question of CTEPH (which would require v/q this time)  or for that matter any other chronic pulmonary disorder but need to fully treat the acute one 1st.  Discussed in detail  all the  indications, usual  risks and alternatives  relative to the benefits with patient who agrees to proceed with Rx as outlined.      Each maintenance medication was reviewed in detail including emphasizing most importantly the difference between maintenance and prns and under what circumstances the prns are to be triggered using an action plan format where appropriate.  Total time for H and P, chart review, counseling,  directly  observing portions of ambulatory 02 saturation study/ and generating customized AVS unique to this office visit / same day charting = 60 min complex post hosp f/u pt                    Sandrea Hughs, MD 11/18/2023

## 2023-11-18 NOTE — Addendum Note (Signed)
Addended byTrena Platt on: 11/18/2023 04:14 PM   Modules accepted: Orders

## 2023-11-19 NOTE — Telephone Encounter (Signed)
Pt informed

## 2023-12-17 ENCOUNTER — Encounter: Payer: Self-pay | Admitting: Nurse Practitioner

## 2023-12-17 ENCOUNTER — Ambulatory Visit: Payer: Medicare Other | Attending: Nurse Practitioner | Admitting: Nurse Practitioner

## 2023-12-17 VITALS — BP 116/64 | HR 87 | Ht 67.0 in | Wt 208.0 lb

## 2023-12-17 DIAGNOSIS — R531 Weakness: Secondary | ICD-10-CM

## 2023-12-17 DIAGNOSIS — I2699 Other pulmonary embolism without acute cor pulmonale: Secondary | ICD-10-CM | POA: Diagnosis not present

## 2023-12-17 DIAGNOSIS — Z87898 Personal history of other specified conditions: Secondary | ICD-10-CM | POA: Diagnosis not present

## 2023-12-17 DIAGNOSIS — R079 Chest pain, unspecified: Secondary | ICD-10-CM | POA: Diagnosis not present

## 2023-12-17 DIAGNOSIS — I10 Essential (primary) hypertension: Secondary | ICD-10-CM

## 2023-12-17 DIAGNOSIS — I824Y9 Acute embolism and thrombosis of unspecified deep veins of unspecified proximal lower extremity: Secondary | ICD-10-CM

## 2023-12-17 DIAGNOSIS — I2089 Other forms of angina pectoris: Secondary | ICD-10-CM

## 2023-12-17 DIAGNOSIS — E782 Mixed hyperlipidemia: Secondary | ICD-10-CM

## 2023-12-17 DIAGNOSIS — M79606 Pain in leg, unspecified: Secondary | ICD-10-CM

## 2023-12-17 DIAGNOSIS — Z8673 Personal history of transient ischemic attack (TIA), and cerebral infarction without residual deficits: Secondary | ICD-10-CM

## 2023-12-17 MED ORDER — RANOLAZINE ER 500 MG PO TB12: 500.0000 mg | ORAL_TABLET | Freq: Two times a day (BID) | ORAL | 1 refills | Status: DC

## 2023-12-17 NOTE — Progress Notes (Signed)
 Cardiology Office Note:  .   Date: 12/17/2023 ID:  [name], DOB 01-24-1951, MRN [id number] PCP: Anabel Halon, MD  [facility] Providers Cardiologist:  Nona Dell, MD    History of Present Illness: .   [name] is a 73 y.o. female with a PMH of chest pain, hypertension, mixed hyperlipidemia, history of PE, history of remote stroke, vision loss, sickle cell trait ([place]), G6PD deficiency, who presents today for palpitations, syncope evaluation.   Last seen by Dr. Diona Browner on August 30th, 2024.  She noted intermittent chest discomfort, there is no change in pattern or intensity.  She noted rarely having to use nitroglycerin, was taking Xanax when experiencing chest discomfort.  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  She denied any hypotension at the time.  She contacted our office last week noting lightheadedness last Thursday morning when walking in her home, she woke up on the floor and had urinary incontinence.  Since Saturday, she noted her rehab and elevated with HR up to 139 bpm.  Unable to walk due to being short of breath and dizzy.  Vital signs are stable at the time of the phone call with nurse.  Dr. Jenene Slicker recommended ER visit for syncope due to unclear etiology.  Patient was advised to call EMS as she did not have anyone at the time to drive her to ER.  9/56/2130 -presents for follow-up.  Had not been seen in the ED as advised.  Chief concern was shortness of breath with walking very short distances, and not feel right since previous Thursday after syncopal episode.  Prior to episode, she was in the process of fixing a humidifier, then the next thing she remembers is that she was on the floor. Prior to the episode, felt pain along her upper abdomen/breast line,  felt like a tight band around this area, and felt that she was hyperventilating. Pt noted a past history of PE, says this was about 2  years ago.  She had blood work done yesterday and saw her neurologist.  York Spaniel she had a past history of stroke but unsure when this occurred.  Before April 08, 2023, says she had an incident where she lost peripheral vision along her right eye, MRI was performed that showed remote linear lacunar infarct of the posteroinferior right cerebellum, otherwise normal MRI, nothing acute. Does have some chronic vision loss, she says she is only able to see about 10 inches in front of her, otherwise vision is blurry. Noted headaches for the past few days, usually occurred in the AM. Denied any slurred speech, unilateral weakness.  At this office visit, I was highly suspicious of pulmonary embolism and she was sent to nearest emergency department via EMS.  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  CTA revealed multiple bilateral pulmonary emboli with evidence of right heart strain, consistent with at least submassive PE.Marland Kitchen  Also noted to have positive DVT along right lower extremity.  Was observed closely in ICU setting, remained hemodynamically stable.  Thrombectomy was not recommended..  Transition to Eliquis.  2D echo revealed EF 70 to 75%, mild LVH, grade 1 DD, moderately reduced right ventricular systolic function, small pericardial effusion, moderately enlarged right ventricular size.  Recommended to follow-up with heme/unk in outpatient setting.  12/17/2023 - Today she presents for hospital follow-up.  Patient is a difficult historian due to being frequently distracted during interview.  She states she overall feels  somewhat better, but admits to weakness.  Admits to constant, chest pain that started a few months ago, noted to be mid to left side of chest, says this moves to her left side.  Says it is also radiates through to her back.  Has been worsening recently.  Describes current chest pain as an ache, 4 or 5 on 0-10 pain scale.  Also says left arm has some numbness occasionally.  Says it feels like her "heart  is trying to do something, like working hard."  She notices typically when doing exertional activities, says this is "really aggravating."  Says has been a long time since she has had a heart cath procedure performed and is wondering if her symptoms are due to a blockage.  Tells me she has not been sleeping well recently, admits to leg cramps along both legs.  Back of her left leg is swollen, tender. Denies any palpitations, syncope, presyncope, dizziness, orthopnea, PND, significant weight changes, acute bleeding, or claudication.   ROS: Negative. See HPI.   Studies Reviewed: Marland Kitchen    EKG: EKG Interpretation Date/Time:  Friday December 17 2023 11:16:32 EDT Ventricular Rate:  82 PR Interval:  156 QRS Duration:  72 QT Interval:  384 QTC Calculation: 448 R Axis:   72  Text Interpretation: Normal sinus rhythm Cannot rule out Inferior infarct , age undetermined When compared with ECG of 04-Nov-2023 09:47, PREVIOUS ECG IS PRESENT Confirmed by Sharlene Dory [phone]) on 12/17/2023 11:18:23 AM    Ultrasound venous lower extremity bilateral 10/2023: IMPRESSION: Directed duplex of the right lower extremity positive for distal DVT, including both muscular (gastrocs/soleal) veins and axial (peroneal) vein.   Directed duplex of the left lower extremity positive for distal DVT, involving the peroneal vein.  Echo 10/2023: 1. Left ventricular ejection fraction, by estimation, is 70 to 75%. The  left ventricle has hyperdynamic function. The left ventricle has no  regional wall motion abnormalities. There is mild left ventricular  hypertrophy. Left ventricular diastolic  parameters are consistent with Grade I diastolic dysfunction (impaired  relaxation).   2. Right ventricular systolic function is moderately reduced. The right  ventricular size is moderately enlarged. Tricuspid regurgitation signal is  inadequate for assessing PA pressure.   3. A small pericardial effusion is present. The pericardial  effusion is  circumferential.   4. The mitral valve is normal in structure. No evidence of mitral valve  regurgitation. No evidence of mitral stenosis.   5. The aortic valve is tricuspid. Aortic valve regurgitation is not  visualized. No aortic stenosis is present.   6. The inferior vena cava is normal in size with greater than 50%  respiratory variability, suggesting right atrial pressure of 3 mmHg.    Carotid duplex 06/2023: IMPRESSION: Color duplex indicates minimal homogeneous plaque, with no hemodynamically significant stenosis by duplex criteria in the extracranial cerebrovascular circulation.  Echo 05/2022:  1. Left ventricular ejection fraction, by estimation, is 70 to 75%. The  left ventricle has hyperdynamic function. The left ventricle has no  regional wall motion abnormalities. There is mild left ventricular  hypertrophy. Left ventricular diastolic  parameters are consistent with Grade I diastolic dysfunction (impaired  relaxation). The average left ventricular global longitudinal strain is  -19.7 %. The global longitudinal strain is normal.   2. Right ventricular systolic function is normal. The right ventricular  size is normal. Tricuspid regurgitation signal is inadequate for assessing  PA pressure.   3. The mitral valve is normal in structure. No evidence of  mitral valve  regurgitation. No evidence of mitral stenosis.   4. The aortic valve was not well visualized. Aortic valve regurgitation  is not visualized. No aortic stenosis is present.   5. The inferior vena cava is normal in size with greater than 50%  respiratory variability, suggesting right atrial pressure of 3 mmHg.   Comparison(s): Echocardiogram done 08/23/19 showed an EF of 65-70% Nuclear stress test done 02/05/21 showed an EF of >65%.  Lexiscan 02/2021:  There was no ST segment deviation noted during stress. The left ventricular ejection fraction is hyperdynamic (>65%). This is a low risk study. The  study is normal. There are no perfusion defects  Risk Assessment/Calculations:           The 10-year ASCVD risk score (Arnett DK, et al., 2019) is: 12.8%   Values used to calculate the score:     Age: 64 years     Sex: Female     Is Non-Hispanic African American: No     Diabetic: No     Tobacco smoker: No     Systolic Blood Pressure: 116 mmHg     Is BP treated: Yes     HDL Cholesterol: 22 mg/dL     Total Cholesterol: 176 mg/dL  Physical Exam:   VS:  BP 116/64   Pulse 87   Ht 5\' 7"  (1.702 m)   Wt 208 lb (94.3 kg)   SpO2 94%   BMI 32.58 kg/m    Wt Readings from Last 3 Encounters:  12/17/23 208 lb (94.3 kg)  11/18/23 199 lb (90.3 kg)  11/17/23 200 lb (90.7 kg)    GEN: Obese, 73 y.o. female in acute distress, appears anxious NECK: No JVD; No carotid bruits CARDIAC: S1/S2, RRR, no murmurs, rubs, gallops RESPIRATORY: Regular, overall unlabored, clear and diminished to auscultation without rales, wheezing or rhonchi  EXTREMITIES:  No edema; No deformity   ASSESSMENT AND PLAN: .     Acute PE/DVT, leg pain Recently diagnosed during most recent hospital stay - see workup above.  She has been tolerating Eliquis well.  She tells me she did start with Eliquis starter pack.  Denies any bleeding issues.  Does admit to some leg cramps/pains, left leg tender/swollen.  I do not see any swelling on exam.  I reassured her that this sensation if this is due to DVT will get better over time.  No medication changes at this time.  She will require lifelong OAC due to having recurrent PE and now history of DVT. Care and ED precautions discussed.  Will obtain CBC, CMET, magnesium.  Chest pain of uncertain etiology, hx of microvascular angina Etiology unclear.  Recent CT of chest showed evidence of right heart strain due to numerous bilateral PE.  She is not in acute distress on exam.  Aorta was noted to be in normal caliber on CT imaging.  Will arrange Limited echocardiogram for further evaluation.  Her past history of chest pain was suspected to be due to microvascular angina.  Discussed case with DOD cardiologist, Dr. Dina Rich, and he agreed with starting Ranexa 500 mg BID, will arrange EKG visit.  History of BP dropping in past, therefore wanted to avoid Imdur/amlodipine.  Continue rest of medication regimen.  Care needed precautions discussed.  Obtaining labs as mentioned above.  HTN Blood pressure stable. Discussed to monitor BP at home at least 2 hours after medications and sitting for 5-10 minutes.  Continue current medication regimen.  Obtaining labs as mentioned previously.  Mixed HLD LDL 119 from January 2025.  This is being managed by Dr. Fransico Him. Cannot tolerate statins.  Not addressed, but in the future plan to discuss lipid clinic referral with her.  Weakness This is multifactorial in etiology.  Most likely also due to deconditioning.  Arranging labs as previously mentioned.  Continue follow-up with PCP for further evaluation and management.   Hx of syncope Most likely in the setting of acute PE/DVT-see #1.  Denies any recurrent episodes.  No medication changes at this time.  Arranging labs.  Continue follow-up with PCP.  History of CVA Denies any deficits.  Continue current medication regimen.  Continue follow-up with PCP.  Dispo: Follow-up with me/APP in 4 to 6 weeks or sooner anything changes.  Signed, Sharlene Dory, NP

## 2023-12-17 NOTE — Patient Instructions (Addendum)
 Medication Instructions:  Your physician has recommended you make the following change in your medication:  Please start Ranexa 500 Mg twice daily   Labwork: In 1-2 weeks at [facility]   Testing/Procedures: Your physician has requested that you have an echocardiogram. Echocardiography is a painless test that uses sound waves to create images of your heart. It provides your doctor with information about the size and shape of your heart and how well your heart's chambers and valves are working. This procedure takes approximately one hour. There are no restrictions for this procedure. Please do NOT wear cologne, perfume, aftershave, or lotions (deodorant is allowed). Please arrive 15 minutes prior to your appointment time.  Please note: We ask at that you not bring children with you during ultrasound (echo/ vascular) testing. Due to room size and safety concerns, children are not allowed in the ultrasound rooms during exams. Our front office staff cannot provide observation of children in our lobby area while testing is being conducted. An adult accompanying a patient to their appointment will only be allowed in the ultrasound room at the discretion of the ultrasound technician under special circumstances. We apologize for any inconvenience.  Follow-Up: Your physician recommends that you schedule a follow-up appointment in:  1-2 weeks for EKG after start Ranexa  4-6 weeks with Provider   Any Other Special Instructions Will Be Listed Below (If Applicable).  If you need a refill on your cardiac medications before your next appointment, please call your pharmacy.

## 2023-12-18 ENCOUNTER — Emergency Department (HOSPITAL_COMMUNITY)
Admission: EM | Admit: 2023-12-18 | Discharge: 2023-12-18 | Disposition: A | Discharge status: Home / Self Care | Attending: Emergency Medicine | Admitting: Emergency Medicine

## 2023-12-18 ENCOUNTER — Emergency Department (HOSPITAL_COMMUNITY)

## 2023-12-18 ENCOUNTER — Other Ambulatory Visit: Payer: Self-pay

## 2023-12-18 ENCOUNTER — Encounter (HOSPITAL_COMMUNITY): Payer: Self-pay

## 2023-12-18 DIAGNOSIS — Z91040 Latex allergy status: Secondary | ICD-10-CM | POA: Diagnosis not present

## 2023-12-18 DIAGNOSIS — Z7901 Long term (current) use of anticoagulants: Secondary | ICD-10-CM | POA: Insufficient documentation

## 2023-12-18 DIAGNOSIS — R0789 Other chest pain: Secondary | ICD-10-CM | POA: Insufficient documentation

## 2023-12-18 DIAGNOSIS — R0689 Other abnormalities of breathing: Secondary | ICD-10-CM | POA: Diagnosis not present

## 2023-12-18 DIAGNOSIS — R531 Weakness: Secondary | ICD-10-CM | POA: Insufficient documentation

## 2023-12-18 DIAGNOSIS — R0602 Shortness of breath: Secondary | ICD-10-CM | POA: Insufficient documentation

## 2023-12-18 DIAGNOSIS — I639 Cerebral infarction, unspecified: Secondary | ICD-10-CM | POA: Diagnosis not present

## 2023-12-18 DIAGNOSIS — I10 Essential (primary) hypertension: Secondary | ICD-10-CM | POA: Diagnosis not present

## 2023-12-18 DIAGNOSIS — R456 Violent behavior: Secondary | ICD-10-CM | POA: Diagnosis not present

## 2023-12-18 DIAGNOSIS — R55 Syncope and collapse: Secondary | ICD-10-CM | POA: Diagnosis not present

## 2023-12-18 LAB — CBC WITH DIFFERENTIAL/PLATELET
Abs Immature Granulocytes: 0.02 10*3/uL (ref 0.00–0.07)
Basophils Absolute: 0.1 10*3/uL (ref 0.0–0.1)
Basophils Relative: 1 %
Eosinophils Absolute: 0.3 10*3/uL (ref 0.0–0.5)
Eosinophils Relative: 4 %
HCT: 31.9 % — ABNORMAL LOW (ref 36.0–46.0)
Hemoglobin: 10.8 g/dL — ABNORMAL LOW (ref 12.0–15.0)
Immature Granulocytes: 0 %
Lymphocytes Relative: 26 %
Lymphs Abs: 1.9 10*3/uL (ref 0.7–4.0)
MCH: 32 pg (ref 26.0–34.0)
MCHC: 33.9 g/dL (ref 30.0–36.0)
MCV: 94.4 fL (ref 80.0–100.0)
Monocytes Absolute: 0.5 10*3/uL (ref 0.1–1.0)
Monocytes Relative: 7 %
Neutro Abs: 4.6 10*3/uL (ref 1.7–7.7)
Neutrophils Relative %: 62 %
Platelets: 172 10*3/uL (ref 150–400)
RBC: 3.38 MIL/uL — ABNORMAL LOW (ref 3.87–5.11)
RDW: 13.4 % (ref 11.5–15.5)
WBC: 7.3 10*3/uL (ref 4.0–10.5)
nRBC: 0 % (ref 0.0–0.2)

## 2023-12-18 LAB — COMPREHENSIVE METABOLIC PANEL
ALT: 17 U/L (ref 0–44)
AST: 28 U/L (ref 15–41)
Albumin: 3.6 g/dL (ref 3.5–5.0)
Alkaline Phosphatase: 38 U/L (ref 38–126)
Anion gap: 8 (ref 5–15)
BUN: 17 mg/dL (ref 8–23)
CO2: 21 mmol/L — ABNORMAL LOW (ref 22–32)
Calcium: 9 mg/dL (ref 8.9–10.3)
Chloride: 105 mmol/L (ref 98–111)
Creatinine, Ser: 1.01 mg/dL — ABNORMAL HIGH (ref 0.44–1.00)
GFR, Estimated: 60 mL/min — ABNORMAL LOW (ref >60)
Glucose, Bld: 114 mg/dL — ABNORMAL HIGH (ref 70–99)
Potassium: 3.6 mmol/L (ref 3.5–5.1)
Sodium: 134 mmol/L — ABNORMAL LOW (ref 135–145)
Total Bilirubin: 1 mg/dL (ref 0.0–1.2)
Total Protein: 6.5 g/dL (ref 6.5–8.1)

## 2023-12-18 LAB — URINALYSIS, ROUTINE W REFLEX MICROSCOPIC
Bacteria, UA: NONE SEEN
Bilirubin Urine: NEGATIVE
Glucose, UA: NEGATIVE mg/dL
Hgb urine dipstick: NEGATIVE
Ketones, ur: NEGATIVE mg/dL
Nitrite: NEGATIVE
Protein, ur: NEGATIVE mg/dL
Specific Gravity, Urine: 1.012 (ref 1.005–1.030)
pH: 6 (ref 5.0–8.0)

## 2023-12-18 LAB — MAGNESIUM: Magnesium: 1.6 mg/dL — ABNORMAL LOW (ref 1.7–2.4)

## 2023-12-18 LAB — RESP PANEL BY RT-PCR (RSV, FLU A&B, COVID)  RVPGX2
Influenza A by PCR: NEGATIVE
Influenza B by PCR: NEGATIVE
Resp Syncytial Virus by PCR: NEGATIVE
SARS Coronavirus 2 by RT PCR: NEGATIVE

## 2023-12-18 LAB — D-DIMER, QUANTITATIVE: D-Dimer, Quant: 0.39 ug{FEU}/mL (ref 0.00–0.50)

## 2023-12-18 LAB — TROPONIN I (HIGH SENSITIVITY)
Troponin I (High Sensitivity): 7 ng/L (ref <18)
Troponin I (High Sensitivity): 6 ng/L (ref <18)

## 2023-12-18 MED ORDER — SODIUM CHLORIDE 0.9 % IV BOLUS
500.0000 mL | Freq: Once | INTRAVENOUS | Status: AC
  Administered 2023-12-18: 500 mL via INTRAVENOUS

## 2023-12-18 MED ORDER — OXYCODONE-ACETAMINOPHEN 5-325 MG PO TABS: 1.0000 | ORAL_TABLET | Freq: Four times a day (QID) | ORAL | 0 refills | Status: DC | PRN

## 2023-12-18 NOTE — ED Triage Notes (Addendum)
 Patient brought from home by [facility] for generalized weakness, "feeling like legs were bricks", and shortness of breath. Patient has an ache in her chest as well. Patient has a history of PE and other embolism.

## 2023-12-18 NOTE — ED Provider Notes (Signed)
 [place] EMERGENCY DEPARTMENT AT [facility] Provider Note   CSN: [id number] Arrival date & time: 12/18/23  1308     History {Add pertinent medical, surgical, social history, OB history to HPI:1} Chief Complaint  Patient presents with   Shortness of Breath   Weakness   Chest Pain    [name] is a 73 y.o. female.  Patient complains of left-sided chest discomfort.  She has a history of PEs and saw cardiology yesterday.   Shortness of Breath Associated symptoms: chest pain   Weakness Associated symptoms: chest pain and shortness of breath   Chest Pain Associated symptoms: shortness of breath and weakness        Home Medications Prior to Admission medications   Medication Sig Start Date End Date Taking? Authorizing Provider  albuterol (PROVENTIL) (2.5 MG/3ML) 0.083% nebulizer solution Take 3 mLs (2.5 mg total) by nebulization every 4 (four) hours as needed for wheezing or shortness of breath. 03/29/23   Anabel Halon, MD  ALPRAZolam Prudy Feeler) 1 MG tablet Take 1 tablet (1 mg total) by mouth 2 (two) times daily as needed for anxiety. 11/17/23   Anabel Halon, MD  apixaban (ELIQUIS) 5 MG TABS tablet Take 1 tablet (5 mg total) by mouth 2 (two) times daily. 11/18/23   Anabel Halon, MD  brimonidine (ALPHAGAN) 0.2 % ophthalmic solution Place 1 drop into both eyes 2 (two) times daily. 11/25/20   Salley Scarlet, MD  chlorpheniramine-HYDROcodone (TUSSIONEX) 10-8 MG/5ML Take 5 mLs by mouth every 12 (twelve) hours as needed for cough. 10/04/23   Anabel Halon, MD  Cholecalciferol (VITAMIN D3) 50 MCG (2000 UT) TABS Take 2,000 Units by mouth daily.    [provider]  dexlansoprazole (DEXILANT) 60 MG capsule Take 1 capsule (60 mg total) by mouth daily. 08/10/23   Imogene Burn, MD  fenofibrate (TRICOR) 145 MG tablet Take 1 tablet (145 mg total) by mouth daily. 04/21/23   Anabel Halon, MD  Fluticasone-Umeclidin-Vilant (TRELEGY ELLIPTA) 100-62.5-25 MCG/ACT  AEPB Inhale 1 puff into the lungs daily. 10/04/23   Anabel Halon, MD  irbesartan (AVAPRO) 150 MG tablet Take 0.5 tablets (75 mg total) by mouth daily. 10/04/23   Anabel Halon, MD  latanoprost (XALATAN) 0.005 % ophthalmic solution Place 1 drop into both eyes at bedtime. 11/25/20   Salley Scarlet, MD  Magnesium 500 MG TABS Take 1 tablet by mouth 3 (three) times daily with meals.    [provider]  meclizine (ANTIVERT) 12.5 MG tablet Take 1 tablet (12.5 mg total) by mouth 3 (three) times daily as needed for dizziness. 10/04/23   Anabel Halon, MD  nitroGLYCERIN (NITROLINGUAL) 0.4 MG/SPRAY spray Place 1 spray under the tongue every 5 (five) minutes as needed for chest pain. 10/04/23   Anabel Halon, MD  NON FORMULARY Take 1 tablet by mouth daily as needed (Stomach Bloat). FD GUARD    [provider]  Nutritional Supplements (IBS SUPPORT PO) Take 1 tablet by mouth daily as needed (stomach aches). FD Guard and IB Guard Peppermint/Fennel seeds    [provider]  ondansetron (ZOFRAN) 4 MG tablet TAKE ONE TABLET BY MOUTH EVERY 8 HOURS AS NEEDED FOR NAUSEA OR VOMITING 09/23/23   Anabel Halon, MD  oxyCODONE-acetaminophen (PERCOCET/ROXICET) 5-325 MG tablet Take 1 tablet by mouth every 6 (six) hours as needed for severe pain (pain score 7-10). 12/18/23   Bethann Berkshire, MD  Phenyleph-Doxylamine-DM-APAP (TYLENOL COLD/FLU/COUGH NIGHT PO) Take  2 tablets by mouth daily as needed (cough/cold).    [provider]  Probiotic Product (PROBIOTIC DAILY PO) Take 1 capsule by mouth daily. VSL #3    [provider]  ranolazine (RANEXA) 500 MG 12 hr tablet Take 1 tablet (500 mg total) by mouth 2 (two) times daily. 12/17/23   Sharlene Dory, NP  rizatriptan (MAXALT-MLT) 10 MG disintegrating tablet DISSOLVE ONE TABLET ON TONGUE AS NEEDED FOR MIGRAINE. MAY REPEAT IN 2 HOURS IF NEEDED 11/08/23   Windell Norfolk, MD  zolpidem (AMBIEN) 10 MG tablet Take 1 tablet (10 mg total)  by mouth at bedtime as needed for sleep. 07/22/23   Anabel Halon, MD      Allergies    Nutmeg oil (myristica oil), Adhesive [tape], Aspirin, Imitrex [sumatriptan], Keflex [cephalexin], Levaquin [levofloxacin in d5w], Other, Statins, Sulfa antibiotics, Sulfonamide derivatives, and Latex    Review of Systems   Review of Systems  Respiratory:  Positive for shortness of breath.   Cardiovascular:  Positive for chest pain.  Neurological:  Positive for weakness.    Physical Exam Updated Vital Signs BP 139/89 (BP Location: Right Arm)   Pulse 84   Temp 98.7 F (37.1 C) (Oral)   Resp (!) 21   Ht 5\' 7"  (1.702 m)   Wt 91.6 kg   SpO2 98%   BMI 31.64 kg/m  Physical Exam  ED Results / Procedures / Treatments   Labs (all labs ordered are listed, but only abnormal results are displayed) Labs Reviewed  CBC WITH DIFFERENTIAL/PLATELET - Abnormal; Notable for the following components:      Result Value   RBC 3.38 (*)    Hemoglobin 10.8 (*)    HCT 31.9 (*)    All other components within normal limits  COMPREHENSIVE METABOLIC PANEL - Abnormal; Notable for the following components:   Sodium 134 (*)    CO2 21 (*)    Glucose, Bld 114 (*)    Creatinine, Ser 1.01 (*)    GFR, Estimated 60 (*)    All other components within normal limits  URINALYSIS, ROUTINE W REFLEX MICROSCOPIC - Abnormal; Notable for the following components:   Leukocytes,Ua TRACE (*)    All other components within normal limits  MAGNESIUM - Abnormal; Notable for the following components:   Magnesium 1.6 (*)    All other components within normal limits  RESP PANEL BY RT-PCR (RSV, FLU A&B, COVID)  RVPGX2  D-DIMER, QUANTITATIVE  TROPONIN I (HIGH SENSITIVITY)  TROPONIN I (HIGH SENSITIVITY)    EKG EKG Interpretation Date/Time:  Saturday December 18 2023 09:19:52 EDT Ventricular Rate:  87 PR Interval:  173 QRS Duration:  96 QT Interval:  375 QTC Calculation: 452 R Axis:   22  Text Interpretation: Sinus rhythm Low  voltage, precordial leads Nonspecific T abnormalities, anterior leads Confirmed by Bethann Berkshire [phone]) on 12/18/2023 10:39:26 AM  Radiology DG Chest Port 1 View Result Date: 12/18/2023 CLINICAL DATA:  Generalized weakness.  Short of breath. EXAM: PORTABLE CHEST 1 VIEW COMPARISON:  11/04/2023. FINDINGS: Cardiac silhouette normal in size and configuration. Normal mediastinal and hilar contours. Clear lungs.  No convincing pleural effusion.  No pneumothorax. Skeletal structures are grossly intact. IMPRESSION: No active disease. Electronically Signed   By: Amie Portland M.D.   On: 12/18/2023 10:32   CT Head Wo Contrast Result Date: 12/18/2023 CLINICAL DATA:  Syncope/presyncope with cerebrovascular cause suspected EXAM: CT HEAD WITHOUT CONTRAST TECHNIQUE: Contiguous axial images were obtained from the base of the skull through  the vertex without intravenous contrast. RADIATION DOSE REDUCTION: This exam was performed according to the departmental dose-optimization program which includes automated exposure control, adjustment of the mA and/or kV according to patient size and/or use of iterative reconstruction technique. COMPARISON:  Brain MRI 06/21/2023 FINDINGS: Brain: No evidence of acute infarction, hemorrhage, hydrocephalus, extra-axial collection or mass lesion/mass effect. Small remote right cerebellar infarct better seen on prior MRI. Vascular: No hyperdense vessel or unexpected calcification. Skull: Normal. Negative for fracture or focal lesion. Sinuses/Orbits: No acute finding. IMPRESSION: No acute or interval finding. Electronically Signed   By: Tiburcio Pea M.D.   On: 12/18/2023 10:16    Procedures Procedures  {Document cardiac monitor, telemetry assessment procedure when appropriate:1}  Medications Ordered in ED Medications  sodium chloride 0.9 % bolus 500 mL (0 mLs Intravenous Stopped 12/18/23 1130)    ED Course/ Medical Decision Making/ A&P   {   Click here for ABCD2, HEART and other  calculatorsREFRESH Note before signing :1}                              Medical Decision Making Amount and/or Complexity of Data Reviewed Labs: ordered. Radiology: ordered.  Risk Prescription drug management.  Patient with chest wall pain.  She is given Percocets and will follow-up with her cardiologist and her PCP  {Document critical care time when appropriate:1} {Document review of labs and clinical decision tools ie heart score, Chads2Vasc2 etc:1}  {Document your independent review of radiology images, and any outside records:1} {Document your discussion with family members, caretakers, and with consultants:1} {Document social determinants of health affecting pt's care:1} {Document your decision making why or why not admission, treatments were needed:1} Final Clinical Impression(s) / ED Diagnoses Final diagnoses:  Weakness  Chest wall pain    Rx / DC Orders ED Discharge Orders          Ordered    oxyCODONE-acetaminophen (PERCOCET/ROXICET) 5-325 MG tablet  Every 6 hours PRN,   Status:  Discontinued        12/18/23 1306    oxyCODONE-acetaminophen (PERCOCET/ROXICET) 5-325 MG tablet  Every 6 hours PRN        12/18/23 1307

## 2023-12-18 NOTE — Discharge Instructions (Signed)
 Increase your magnesium to at least 1000 mg a day.  Try Tylenol for pain and if that does not improve your discomfort you can take the Percocets.  Follow-up with your family doctor if not improving

## 2023-12-21 ENCOUNTER — Other Ambulatory Visit: Payer: Self-pay | Admitting: Nurse Practitioner

## 2023-12-21 MED ORDER — RANOLAZINE ER 500 MG PO TB12: 500.0000 mg | ORAL_TABLET | Freq: Two times a day (BID) | ORAL | 1 refills | Status: DC

## 2023-12-23 ENCOUNTER — Ambulatory Visit (INDEPENDENT_AMBULATORY_CARE_PROVIDER_SITE_OTHER): Admitting: Internal Medicine

## 2023-12-23 ENCOUNTER — Encounter: Payer: Self-pay | Admitting: Internal Medicine

## 2023-12-23 ENCOUNTER — Ambulatory Visit (HOSPITAL_COMMUNITY)
Admission: RE | Admit: 2023-12-23 | Discharge: 2023-12-23 | Disposition: A | Discharge status: Home / Self Care | Source: Ambulatory Visit | Attending: Internal Medicine | Admitting: Internal Medicine

## 2023-12-23 VITALS — BP 110/66 | HR 95 | Ht 67.0 in | Wt 205.6 lb

## 2023-12-23 DIAGNOSIS — M79605 Pain in left leg: Secondary | ICD-10-CM | POA: Diagnosis not present

## 2023-12-23 DIAGNOSIS — Z86718 Personal history of other venous thrombosis and embolism: Secondary | ICD-10-CM | POA: Diagnosis not present

## 2023-12-23 DIAGNOSIS — I824Z2 Acute embolism and thrombosis of unspecified deep veins of left distal lower extremity: Secondary | ICD-10-CM | POA: Insufficient documentation

## 2023-12-23 DIAGNOSIS — F5104 Psychophysiologic insomnia: Secondary | ICD-10-CM | POA: Diagnosis not present

## 2023-12-23 DIAGNOSIS — I2699 Other pulmonary embolism without acute cor pulmonale: Secondary | ICD-10-CM | POA: Diagnosis not present

## 2023-12-23 DIAGNOSIS — Z09 Encounter for follow-up examination after completed treatment for conditions other than malignant neoplasm: Secondary | ICD-10-CM | POA: Diagnosis not present

## 2023-12-23 DIAGNOSIS — I10 Essential (primary) hypertension: Secondary | ICD-10-CM

## 2023-12-23 DIAGNOSIS — M7122 Synovial cyst of popliteal space [Baker], left knee: Secondary | ICD-10-CM | POA: Diagnosis not present

## 2023-12-23 MED ORDER — ZOLPIDEM TARTRATE 10 MG PO TABS
10.0000 mg | ORAL_TABLET | Freq: Every evening | ORAL | 1 refills | Status: DC | PRN
Start: 2023-12-23 — End: 2024-06-07

## 2023-12-23 MED ORDER — IRBESARTAN 150 MG PO TABS: 75.0000 mg | ORAL_TABLET | Freq: Every day | ORAL | 1 refills | Status: DC

## 2023-12-23 NOTE — Assessment & Plan Note (Signed)
 Continue Eliquis 5 mg twice daily Planned to see Heme./Onc.  Considering recurrent pulmonary embolism, history of G6PD mutation and sickle cell trait Evaluated by pulmonology for recurrent PE and asthma

## 2023-12-23 NOTE — Assessment & Plan Note (Signed)
 Recent hospitalization for acute PE and DVT Due to new onset left popliteal area and calf area pain - check [place] LE STAT to rule out new DVT, although it would be less likely while on Eliquis If she develops new DVT while on Eliquis, will need to switch oral DOAC

## 2023-12-23 NOTE — Assessment & Plan Note (Signed)
  BP Readings from Last 1 Encounters:  12/23/23 110/66   Well-controlled with Irbesartan 75 mg every day -advised to hold irbesartan if she has dizziness Counseled for compliance with the medications Advised DASH diet and moderate exercise/walking as tolerated

## 2023-12-23 NOTE — Assessment & Plan Note (Signed)
 ER chart reviewed, including EKG and imaging Chest pain resolved now Continue Ranexa

## 2023-12-23 NOTE — Assessment & Plan Note (Signed)
Well-controlled Takes Ambien 10 mg qHS PRN

## 2023-12-23 NOTE — Progress Notes (Signed)
 Established Patient Office Visit  Subjective:  Patient ID: [name], female    DOB: [birth year]  Age: 73 y.o. MRN: [id number]  CC:  Chief Complaint  Patient presents with   Follow-up    ER f/u from 12/18/23, thinks she has a blood clot on left leg has pain since yesterday     HPI [name] is a 73 y.o. female with past medical history of HTN, GERD, IBS-C, NASH, anal fissure, hypothyroidism, OA, DDD of lumbar spine, anxiety/panic disorder, insomnia, left sided blind eye, G6PD mutation and sickle cell trait who presents for f/u of recent ER visit on 12/18/23.  She went to ER on 12/18/2023 for left-sided chest discomfort, which was constant and nonradiating.  She had mild palpitations as well, which have resolved now.  Her EKG did not show any acute ischemic changes.  She was recently placed on Ranexa for chest pain episodes.  Today, she denies any chest pain, dyspnea or palpitations.  Her BP is WNL.  She has left calf pain, which is dull, constant since yesterday.  She had bilateral LE DVT and acute PE in 01/25 and was admitted in the hospital due to dyspnea due to PE.  She has been taking Eliquis 5 mg twice daily since then.  Past Medical History:  Diagnosis Date   Anal fissure    Anxiety    Arthritis    Asthma    C. difficile diarrhea 09/17/2021   Cataract    Phreesia 01/05/2021   Chest pain    a. 02/2000 Cath: nl cors, EF 60%;  b. 10/2010 MV: nl LV, no ischemia/infarct;  c. 03/2014 Admit c/p, r/o->grief from husbands death.   Chronic RUQ pain 2007/12/27   EUS slightly dilated CBD (7.90mm), otherwise nl   Depression    Diverticulosis    Eczema    Exposure to chemical inhalation mid 1970s   Fatty liver    G6PD deficiency    Gallstones    GERD (gastroesophageal reflux disease)    Glaucoma    History of pulmonary embolus (PE)    Treated with Eliquis December 27, 2014   HTN (hypertension)    Hypercholesteremia    a. intolerant to statins.   Hypothyroidism    IBS (irritable bowel  syndrome)    Kidney stone    Legally blind    Patient is completely blind in the left eye. She has limited vision in the right eye.   Migraines    inner ocular   Ocular migraine    Palpitations    Pleurisy    Pneumonia 07/16/2021   Patient was exposed to a harsh chemical in the 1970's that created scar tissue in her lungs. She has had pneumonia several times in the past.   PONV (postoperative nausea and vomiting)    Pulmonary emboli (HCC)    Recurrent upper respiratory infection (URI)    Renal cyst    Retinal cyst    Sickle cell trait (HCC)    Tubular adenoma of colon 09/17/2011   Urticaria     Past Surgical History:  Procedure Laterality Date   ABDOMINAL HYSTERECTOMY     ADENOIDECTOMY     ANGIOPLASTY     APPENDECTOMY     BIOPSY N/A 03/14/2013   Procedure: SMALL BOWEL AND GASTRIC BIOPSIES (Procedure #1);  Surgeon: West Bali, MD;  Location: [facility];  Service: Endoscopy;  Laterality: N/A;   CARDIAC CATHETERIZATION Left 02/11/2000   CATARACT EXTRACTION Left    CATARACT EXTRACTION W/PHACO  Right 03/19/2021   Procedure: CATARACT EXTRACTION PHACO AND INTRAOCULAR LENS PLACEMENT (IOC) RIGHT 6.75 00:50.4;  Surgeon: Lockie Mola, MD;  Location[facility];  Service: Ophthalmology;  Laterality: Right;   CHOLECYSTECTOMY  12/2007   COLONOSCOPY  12/22/2010   ZOX:WRUEAV, cecal adenomatous polyp   COLONOSCOPY WITH PROPOFOL N/A 03/31/2016   Dr. Darrick Penna: four sessile polyps rectum/sigmoid colon, diverticulosi, ext/int hemorrhoids. hyperplastic polyps, next tcs 5 years.   COLONOSCOPY WITH PROPOFOL N/A 04/21/2021   anal fissure, non-bleeding internal hemorrhoids, right colon diverticulosis, one 2 mm polyp in ascending, three 4-6 mm polyps in descending colon. Tubular adenomas. 5 year surveillance.   complete hysterectomy     ENTEROSCOPY N/A 03/14/2013   WUJ:WJXB gastritis/ulcers has healed   ESOPHAGOGASTRODUODENOSCOPY  09/22/2011   JYN:WGNF gastritis/Duodenitis    EXCISIONAL HEMORRHOIDECTOMY     FLEXIBLE SIGMOIDOSCOPY N/A 03/14/2013   SLF:3 colon polyp removed/moderate sized internal hemorrhoids   FLEXIBLE SIGMOIDOSCOPY N/A 06/09/2016   Procedure: FLEXIBLE SIGMOIDOSCOPY;  Surgeon: West Bali, MD;  [facility];  Service: Endoscopy;  Laterality: N/A;  rectal polyps times 2   FLEXIBLE SIGMOIDOSCOPY N/A 03/18/2018   Procedure: FLEXIBLE SIGMOIDOSCOPY;  Surgeon: West Bali, MD;  [facility]: [facility];  Service: Endoscopy;  Laterality: N/A;  9:15am   FOOT SURGERY     bunion removal left   GIVENS CAPSULE STUDY N/A 02/10/2013   Procedure: GIVENS CAPSULE STUDY;  Surgeon: West Bali, MD;  Location: [facility];  Service: Endoscopy;  Laterality: N/A;  730   HEEL SPUR EXCISION     right    HEMORRHOID BANDING N/A 03/14/2013   Procedure: HEMORRHOID BANDING (Procedure #3)  3 bands applied [id number] Exp 02/02/2014 ;  Surgeon: West Bali, MD;  Location: [facility];  Service: Endoscopy;  Laterality: N/A;   HEMORRHOID SURGERY N/A 04/24/2016   Procedure: EXTENSIVE HEMORRHOIDECTOMY;  Surgeon: Ancil Linsey, MD;  Location: [facility];  Service: General;  Laterality: N/A;   LAPAROSCOPY     adhesions   POLYPECTOMY N/A 03/14/2013   ONG:EXBM Gastritis . ULCERS SEEN ON MAY 6 HAVE HEALED   POLYPECTOMY  03/31/2016   Procedure: POLYPECTOMY;  Surgeon: West Bali, MD;  [facility];  Service: Endoscopy;;  sigmoid colon polyps x2, rectal polyps x2   POLYPECTOMY  04/21/2021   Procedure: POLYPECTOMY;  Surgeon: Lanelle Bal, DO;  [facility];  Service: Endoscopy;;   RECTAL EXAM UNDER ANESTHESIA N/A 07/23/2021   Procedure: RECTAL EXAM UNDER ANESTHESIA;  Surgeon: Romie Levee, MD;  Location: [facility];  Service: General;  Laterality: N/A;   SPHINCTEROTOMY N/A 07/23/2021   Procedure: CHEMICAL SPHINCTEROTOMY;  Surgeon: Romie Levee, MD;  Location: [facility];  Service: General;  Laterality:  N/A;   TONSILLECTOMY     TUBAL LIGATION      Family History  Problem Relation Age of Onset   Colon cancer Mother        31s   Urticaria Mother    Heart disease Mother    Colon polyps Mother    Cancer Father        oral   Crohn's disease Sister    Cancer Sister        around heart   Colon polyps Sister    Diabetes Brother    Other Brother        intestinal sugery   Colon polyps Brother    Colon cancer Maternal Grandmother    Colon cancer  Maternal Grandfather    Colon cancer Paternal Grandfather    Anesthesia problems Neg Hx     Social History   Socioeconomic History   Marital status: Widowed    Spouse name: Not on file   Number of children: 1   Years of education: Not on file   Highest education level: Not on file  Occupational History   Occupation: homemaker    Employer: UNEMPLOYED  Tobacco Use   Smoking status: Former    Current packs/day: 0.00    Average packs/day: 0.5 packs/day for 30.0 years (15.0 ttl pk-yrs)    Types: Cigarettes    Start date: 10/15/1966    Quit date: 10/15/1996    Years since quitting: 27.2   Smokeless tobacco: Never   Tobacco comments:    Stopped smoking ~ 2000  Vaping Use   Vaping status: Never Used  Substance and Sexual Activity   Alcohol use: Not Currently    Alcohol/week: 4.0 standard drinks of alcohol    Types: 4 Glasses of wine per week    Comment: 1 drinks every couple of weeks   Drug use: No   Sexual activity: Not Currently    Birth control/protection: Surgical  Other Topics Concern   Not on file  Social History Narrative   Does not routinely exercise. Husband passed in JUN 2015 due to prostate ca.   Social Drivers of Corporate investment banker Strain: Low Risk  (06/11/2023)   Overall Financial Resource Strain (CARDIA)    Difficulty of Paying Living Expenses: Not hard at all  Food Insecurity: No Food Insecurity (11/04/2023)   Hunger Vital Sign    Worried About Running Out of Food in the Last Year: Never true    Ran  Out of Food in the Last Year: Never true  Transportation Needs: No Transportation Needs (11/04/2023)   PRAPARE - Administrator, Civil Service (Medical): No    Lack of Transportation (Non-Medical): No  Physical Activity: Sufficiently Active (06/11/2023)   Exercise Vital Sign    Days of Exercise per Week: 6 days    Minutes of Exercise per Session: 30 min  Stress: No Stress Concern Present (06/11/2023)   [employer] of Occupational Health - Occupational Stress Questionnaire    Feeling of Stress : Not at all  Social Connections: Socially Isolated (11/04/2023)   Social Connection and Isolation Panel [NHANES]    Frequency of Communication with Friends and Family: Three times a week    Frequency of Social Gatherings with Friends and Family: Once a week    Attends Religious Services: Never    Database administrator or Organizations: No    Attends Banker Meetings: Never    Marital Status: Widowed  Intimate Partner Violence: Not At Risk (11/04/2023)   Humiliation, Afraid, Rape, and Kick questionnaire    Fear of Current or Ex-Partner: No    Emotionally Abused: No    Physically Abused: No    Sexually Abused: No    Outpatient Medications Prior to Visit  Medication Sig Dispense Refill   albuterol (PROVENTIL) (2.5 MG/3ML) 0.083% nebulizer solution Take 3 mLs (2.5 mg total) by nebulization every 4 (four) hours as needed for wheezing or shortness of breath. 150 mL 3   ALPRAZolam (XANAX) 1 MG tablet Take 1 tablet (1 mg total) by mouth 2 (two) times daily as needed for anxiety. 180 tablet 0   apixaban (ELIQUIS) 5 MG TABS tablet Take 1 tablet (5 mg total) by mouth  2 (two) times daily. 180 tablet 1   brimonidine (ALPHAGAN) 0.2 % ophthalmic solution Place 1 drop into both eyes 2 (two) times daily. 15 mL 3   chlorpheniramine-HYDROcodone (TUSSIONEX) 10-8 MG/5ML Take 5 mLs by mouth every 12 (twelve) hours as needed for cough. 115 mL 0   Cholecalciferol (VITAMIN D3) 50 MCG (2000 UT)  TABS Take 2,000 Units by mouth daily.     dexlansoprazole (DEXILANT) 60 MG capsule Take 1 capsule (60 mg total) by mouth daily. 90 capsule 3   fenofibrate (TRICOR) 145 MG tablet Take 1 tablet (145 mg total) by mouth daily. 90 tablet 3   Fluticasone-Umeclidin-Vilant (TRELEGY ELLIPTA) 100-62.5-25 MCG/ACT AEPB Inhale 1 puff into the lungs daily. 3 each 4   latanoprost (XALATAN) 0.005 % ophthalmic solution Place 1 drop into both eyes at bedtime. 9 mL 3   Magnesium 500 MG TABS Take 1 tablet by mouth 3 (three) times daily with meals.     meclizine (ANTIVERT) 12.5 MG tablet Take 1 tablet (12.5 mg total) by mouth 3 (three) times daily as needed for dizziness. 30 tablet 3   nitroGLYCERIN (NITROLINGUAL) 0.4 MG/SPRAY spray Place 1 spray under the tongue every 5 (five) minutes as needed for chest pain. 12 g 3   NON FORMULARY Take 1 tablet by mouth daily as needed (Stomach Bloat). FD GUARD     Nutritional Supplements (IBS SUPPORT PO) Take 1 tablet by mouth daily as needed (stomach aches). FD Guard and IB Guard Peppermint/Fennel seeds     ondansetron (ZOFRAN) 4 MG tablet TAKE ONE TABLET BY MOUTH EVERY 8 HOURS AS NEEDED FOR NAUSEA OR VOMITING 30 tablet 1   oxyCODONE-acetaminophen (PERCOCET/ROXICET) 5-325 MG tablet Take 1 tablet by mouth every 6 (six) hours as needed for severe pain (pain score 7-10). 15 tablet 0   Phenyleph-Doxylamine-DM-APAP (TYLENOL COLD/FLU/COUGH NIGHT PO) Take 2 tablets by mouth daily as needed (cough/cold).     Probiotic Product (PROBIOTIC DAILY PO) Take 1 capsule by mouth daily. VSL #3     ranolazine (RANEXA) 500 MG 12 hr tablet Take 1 tablet (500 mg total) by mouth 2 (two) times daily. 90 tablet 1   rizatriptan (MAXALT-MLT) 10 MG disintegrating tablet DISSOLVE ONE TABLET ON TONGUE AS NEEDED FOR MIGRAINE. MAY REPEAT IN 2 HOURS IF NEEDED 9 tablet 6   irbesartan (AVAPRO) 150 MG tablet Take 0.5 tablets (75 mg total) by mouth daily. 45 tablet 1   zolpidem (AMBIEN) 10 MG tablet Take 1 tablet (10  mg total) by mouth at bedtime as needed for sleep. 90 tablet 1   No facility-administered medications prior to visit.    Allergies  Allergen Reactions   Nutmeg Oil (Myristica Oil) Anaphylaxis    Nut oil- skin irritation   Adhesive [Tape] Other (See Comments)    Blisters skin   Aspirin Other (See Comments)    sickle cell trait--  Not recommended unless emergency   Imitrex [Sumatriptan] Hives   Keflex [Cephalexin] Other (See Comments)    G6PD   Levaquin [Levofloxacin In D5w]     Altered mental status Affects G6PD   Other     Nut Oil- skin irritation    Statins Other (See Comments)    Myopathy - elevated CK   Sulfa Antibiotics Other (See Comments)    Patient has sickle cell trait   Sulfonamide Derivatives Other (See Comments)    Patient has sickle cell trait   Latex Rash    ROS Review of Systems  Constitutional:  Negative for chills  and fever.  HENT:  Positive for congestion and sinus pressure. Negative for sore throat.   Eyes:  Positive for photophobia, pain and visual disturbance. Negative for discharge.  Respiratory:  Positive for cough. Negative for shortness of breath and wheezing.   Cardiovascular:  Negative for chest pain and palpitations.  Gastrointestinal:  Positive for constipation. Negative for abdominal pain, blood in stool, diarrhea, nausea and vomiting.  Endocrine: Negative for polydipsia and polyuria.  Genitourinary:  Negative for dysuria and hematuria.  Musculoskeletal:  Positive for arthralgias, back pain, neck pain and neck stiffness.       Left popliteal and calf area pain  Skin:  Negative for rash.  Neurological:  Positive for headaches. Negative for weakness.  Psychiatric/Behavioral:  Negative for agitation and behavioral problems.       Objective:    Physical Exam Vitals reviewed.  Constitutional:      General: She is not in acute distress.    Appearance: She is not diaphoretic.  HENT:     Head: Normocephalic and atraumatic.     Nose:  Congestion present.     Mouth/Throat:     Mouth: Mucous membranes are moist.  Eyes:     General: No scleral icterus.    Extraocular Movements: Extraocular movements intact.  Cardiovascular:     Rate and Rhythm: Normal rate and regular rhythm.     Heart sounds: Normal heart sounds. No murmur heard. Pulmonary:     Breath sounds: Normal breath sounds. No wheezing or rales.  Musculoskeletal:        General: Tenderness (Lower lumbar spine area) present.     Left hand: Decreased strength of thumb/finger opposition.     Cervical back: Neck supple. No tenderness.     Right lower leg: No edema.     Left lower leg: No edema.     Comments: Left popliteal area and calf area warmth and tenderness  Skin:    General: Skin is warm.     Findings: No rash.  Neurological:     General: No focal deficit present.     Mental Status: She is alert and oriented to person, place, and time.     Sensory: No sensory deficit.     Motor: No weakness.  Psychiatric:        Mood and Affect: Mood is anxious.        Behavior: Behavior normal.     BP 110/66   Pulse 95   Ht 5\' 7"  (1.702 m)   Wt 205 lb 9.6 oz (93.3 kg)   SpO2 93%   BMI 32.20 kg/m  Wt Readings from Last 3 Encounters:  12/23/23 205 lb 9.6 oz (93.3 kg)  12/18/23 202 lb (91.6 kg)  12/17/23 208 lb (94.3 kg)    Lab Results  Component Value Date   TSH 1.393 11/03/2023   Lab Results  Component Value Date   WBC 7.3 12/18/2023   HGB 10.8 (L) 12/18/2023   HCT 31.9 (L) 12/18/2023   MCV 94.4 12/18/2023   PLT 172 12/18/2023   Lab Results  Component Value Date   NA 134 (L) 12/18/2023   K 3.6 12/18/2023   CO2 21 (L) 12/18/2023   GLUCOSE 114 (H) 12/18/2023   BUN 17 12/18/2023   CREATININE 1.01 (H) 12/18/2023   BILITOT 1.0 12/18/2023   ALKPHOS 38 12/18/2023   AST 28 12/18/2023   ALT 17 12/18/2023   PROT 6.5 12/18/2023   ALBUMIN 3.6 12/18/2023   CALCIUM 9.0 12/18/2023  ANIONGAP 8 12/18/2023   EGFR 54 (L) 11/17/2023   Lab Results   Component Value Date   CHOL 176 11/03/2023   Lab Results  Component Value Date   HDL 22 (L) 11/03/2023   Lab Results  Component Value Date   LDLCALC 119 (H) 11/03/2023   Lab Results  Component Value Date   TRIG 173 (H) 11/03/2023   Lab Results  Component Value Date   CHOLHDL 8.0 11/03/2023   Lab Results  Component Value Date   HGBA1C 5.1 (A) 11/10/2023      Assessment & Plan:   Problem List Items Addressed This Visit       Cardiovascular and Mediastinum   Essential hypertension, benign    BP Readings from Last 1 Encounters:  12/23/23 110/66   Well-controlled with Irbesartan 75 mg every day -advised to hold irbesartan if she has dizziness Counseled for compliance with the medications Advised DASH diet and moderate exercise/walking as tolerated      Relevant Medications   irbesartan (AVAPRO) 150 MG tablet   Recurrent pulmonary embolism (HCC)   Continue Eliquis 5 mg twice daily Planned to see Heme./Onc.  Considering recurrent pulmonary embolism, history of G6PD mutation and sickle cell trait Evaluated by pulmonology for recurrent PE and asthma      Relevant Medications   irbesartan (AVAPRO) 150 MG tablet   Acute deep vein thrombosis (DVT) of distal vein of left lower extremity (HCC) - Primary   Recent hospitalization for acute PE and DVT Due to new onset left popliteal area and calf area pain - check [place] LE STAT to rule out new DVT, although it would be less likely while on Eliquis If she develops new DVT while on Eliquis, will need to switch oral DOAC      Relevant Medications   irbesartan (AVAPRO) 150 MG tablet   Other Relevant Orders   US Venous Img Lower Unilateral Left (Completed)     Other   Chronic insomnia (Chronic)   Well-controlled Takes Ambien 10 mg qHS PRN      Relevant Medications   zolpidem (AMBIEN) 10 MG tablet   Encounter for examination following treatment at hospital   ER chart reviewed, including EKG and imaging Chest pain  resolved now Continue Ranexa       Meds ordered this encounter  Medications   irbesartan (AVAPRO) 150 MG tablet    Sig: Take 0.5 tablets (75 mg total) by mouth daily.    Dispense:  45 tablet    Refill:  1   zolpidem (AMBIEN) 10 MG tablet    Sig: Take 1 tablet (10 mg total) by mouth at bedtime as needed for sleep.    Dispense:  90 tablet    Refill:  1    FAX ZO:XWRU BY MAIL- [facility] - [phone]    Follow-up: Return if symptoms worsen or fail to improve.    Anabel Halon, MD

## 2023-12-23 NOTE — Patient Instructions (Signed)
 You are being scheduled to get [place] of lower extremity to rule out new DVT.  Please continue taking Eliquis as prescribed.

## 2023-12-27 DIAGNOSIS — H04123 Dry eye syndrome of bilateral lacrimal glands: Secondary | ICD-10-CM | POA: Diagnosis not present

## 2023-12-27 DIAGNOSIS — H43813 Vitreous degeneration, bilateral: Secondary | ICD-10-CM | POA: Diagnosis not present

## 2023-12-27 DIAGNOSIS — H401133 Primary open-angle glaucoma, bilateral, severe stage: Secondary | ICD-10-CM | POA: Diagnosis not present

## 2023-12-27 DIAGNOSIS — Z961 Presence of intraocular lens: Secondary | ICD-10-CM | POA: Diagnosis not present

## 2023-12-28 ENCOUNTER — Telehealth (HOSPITAL_COMMUNITY): Payer: Self-pay | Admitting: Licensed Clinical Social Worker

## 2023-12-28 ENCOUNTER — Other Ambulatory Visit (HOSPITAL_COMMUNITY)
Admission: RE | Admit: 2023-12-28 | Discharge: 2023-12-28 | Disposition: A | Discharge status: Home / Self Care | Source: Ambulatory Visit | Attending: Nurse Practitioner | Admitting: Nurse Practitioner

## 2023-12-28 DIAGNOSIS — I10 Essential (primary) hypertension: Secondary | ICD-10-CM | POA: Diagnosis not present

## 2023-12-28 LAB — COMPREHENSIVE METABOLIC PANEL
ALT: 14 U/L (ref 0–44)
AST: 21 U/L (ref 15–41)
Albumin: 3.8 g/dL (ref 3.5–5.0)
Alkaline Phosphatase: 52 U/L (ref 38–126)
Anion gap: 11 (ref 5–15)
BUN: 15 mg/dL (ref 8–23)
CO2: 24 mmol/L (ref 22–32)
Calcium: 9.5 mg/dL (ref 8.9–10.3)
Chloride: 102 mmol/L (ref 98–111)
Creatinine, Ser: 1.19 mg/dL — ABNORMAL HIGH (ref 0.44–1.00)
GFR, Estimated: 49 mL/min — ABNORMAL LOW (ref >60)
Glucose, Bld: 136 mg/dL — ABNORMAL HIGH (ref 70–99)
Potassium: 4.1 mmol/L (ref 3.5–5.1)
Sodium: 137 mmol/L (ref 135–145)
Total Bilirubin: 0.6 mg/dL (ref 0.0–1.2)
Total Protein: 7 g/dL (ref 6.5–8.1)

## 2023-12-28 LAB — CBC
HCT: 36.2 % (ref 36.0–46.0)
Hemoglobin: 12 g/dL (ref 12.0–15.0)
MCH: 31.9 pg (ref 26.0–34.0)
MCHC: 33.1 g/dL (ref 30.0–36.0)
MCV: 96.3 fL (ref 80.0–100.0)
Platelets: 243 10*3/uL (ref 150–400)
RBC: 3.76 MIL/uL — ABNORMAL LOW (ref 3.87–5.11)
RDW: 13.5 % (ref 11.5–15.5)
WBC: 6.2 10*3/uL (ref 4.0–10.5)
nRBC: 0 % (ref 0.0–0.2)

## 2023-12-28 LAB — MAGNESIUM: Magnesium: 1.8 mg/dL (ref 1.7–2.4)

## 2023-12-28 NOTE — Telephone Encounter (Signed)
 CSW contacted patient to discuss transportation needs. Patient shares that she uses Ambulance person and has no issues with obtaining transport for her Cardiology appointments although has been challenged with the Oncology appointments. She states she has two upcoming appointments and will need transport. Patient shared that the office will need to provide "a code for [facility]". CSW encouraged patient to contact the Oncology office to discuss further and return call if not successful. Patient agreeable and saved CSW number in contacts. CSW available as needed. Lasandra Beech, LCSW, CCSW-MCS [phone]

## 2023-12-29 ENCOUNTER — Telehealth: Payer: Self-pay | Admitting: Cardiology

## 2023-12-29 NOTE — Telephone Encounter (Signed)
 Patient is requesting to speak with directly with someone from the [facility] office. Vicky if possible. She declined providing additional information and says this is a matter that I'm not able to assist with. Please advise.

## 2023-12-30 ENCOUNTER — Telehealth (HOSPITAL_COMMUNITY): Payer: Self-pay | Admitting: Licensed Clinical Social Worker

## 2023-12-30 NOTE — Telephone Encounter (Signed)
 H&V Care Navigation CSW Progress Note  Clinical Social Worker contacted patient by phone to assist with transportation.  Patient is participating in a Managed Medicaid Plan:  No  CSW contacted patient who states she has an upcoming appointment at the [facility] office and is unable to find transportation. Patient is well know to [facility] who transport her to most appointments. CSW contacted [facility] and provided needed information for appointment. Patient grateful for the assistance and waiver reviewed. CSW available as needed. Joanne Beech, LCSW, CCSW-MCS [phone]   SDOH Screenings   Food Insecurity: No Food Insecurity (11/04/2023)  Housing: Low Risk  (11/04/2023)  Transportation Needs: Unmet Transportation Needs (12/30/2023)  Utilities: Not At Risk (11/04/2023)  Alcohol Screen: Low Risk  (06/11/2023)  Depression (PHQ2-9): Low Risk  (12/23/2023)  Financial Resource Strain: Low Risk  (06/11/2023)  Physical Activity: Sufficiently Active (06/11/2023)  Social Connections: Socially Isolated (11/04/2023)  Stress: No Stress Concern Present (06/11/2023)  Tobacco Use: Medium Risk (12/23/2023)  Health Literacy: Adequate Health Literacy (06/11/2023)   12/30/2023  [name] DOB: 08-12-1951 MRN: [id number]   RIDER WAIVER AND RELEASE OF LIABILITY  For the purposes of helping with transportation needs, [facility] partners with outside transportation providers (taxi companies, [place], Catering manager.) to give [employer] patients or other approved people the choice of on-demand rides [employer]") to our buildings for non-emergency visits.  By using [employer], I, the person signing this document, on behalf of myself and/or any legal minors (in my care using the [employer]), agree:  Science writer given to me are supplied by independent, outside transportation providers who do not work for, or have any affiliation with, [employer]. [place] is not a transportation company. [facility] has no  control over the quality or safety of the rides I get using [employer]. [place] has no control over whether any outside ride will happen on time or not. [facility] gives no guarantee on the reliability, quality, safety, or availability on any rides, or that no mistakes will happen. I know and accept that traveling by vehicle (car, truck, SVU, Zenaida Niece, bus, taxi, etc.) has risks of serious injuries such as disability, being paralyzed, and death. I know and agree the risk of using [employer] is mine alone, and not [employer]. Transport Services are provided "as is" and as are available. The transportation providers are in charge for all inspections and care of the vehicles used to provide these rides. I agree not to take legal action against [facility], its agents, employees, officers, directors, representatives, insurers, attorneys, assigns, successors, subsidiaries, and affiliates at any time for any reasons related directly or indirectly to using [employer]. I also agree not to take legal action against [facility] or its affiliates for any injury, death, or damage to property caused by or related to using [employer]. I have read this Waiver and Release of Liability, and I understand the terms used in it and their legal meaning. This Waiver is freely and voluntarily given with the understanding that my right (or any legal minors) to legal action against [facility] relating to [employer] is knowingly given up to use these services.   I attest that I read the Ride Waiver and Release of Liability to [name], gave [name] the opportunity to ask questions and answered the questions asked (if any). I affirm that [name] then provided consent for assistance with transportation.     [name]

## 2023-12-30 NOTE — Telephone Encounter (Signed)
 Patient calling to speak with [name]  She stated she is supposed to be setting up [facility] for appointment next week and it has not been done yet.  ]Advised patient I would route note to her and other front office staff to get this taken care of for her.

## 2024-01-05 ENCOUNTER — Ambulatory Visit: Attending: Nurse Practitioner

## 2024-01-05 ENCOUNTER — Ambulatory Visit: Admitting: *Deleted

## 2024-01-05 DIAGNOSIS — R072 Precordial pain: Secondary | ICD-10-CM | POA: Diagnosis not present

## 2024-01-05 DIAGNOSIS — I10 Essential (primary) hypertension: Secondary | ICD-10-CM | POA: Insufficient documentation

## 2024-01-05 DIAGNOSIS — R079 Chest pain, unspecified: Secondary | ICD-10-CM

## 2024-01-05 LAB — ECHOCARDIOGRAM LIMITED
Area-P 1/2: 3.08 cm2
Calc EF: 63.3 %
S' Lateral: 3.1 cm
Single Plane A2C EF: 68.5 %
Single Plane A4C EF: 56.3 %

## 2024-01-05 NOTE — Progress Notes (Signed)
 Presents for EKG per last office visit and starting ranexa.  2. Chest pain of uncertain etiology, hx of microvascular angina Etiology unclear.  Recent CT of chest showed evidence of right heart strain due to numerous bilateral PE.  She is not in acute distress on exam.  Aorta was noted to be in normal caliber on CT imaging.  Will arrange Limited echocardiogram for further evaluation. Her past history of chest pain was suspected to be due to microvascular angina.  Discussed case with DOD cardiologist, Dr. Dina Rich, and he agreed with starting Ranexa 500 mg BID, will arrange EKG visit.  History of BP dropping in past, therefore wanted to avoid Imdur/amlodipine.  Continue rest of medication regimen.  Care needed precautions discussed.  Obtaining labs as mentioned above  Medications reviewed. Reports taking all doses of medications as prescribed without side effects. Denies chest pain or SOB. Reports 2 days ago, she sat up very fast to administer her eye drops and she felt a little lightheaded. Reports not other lightheadedness since that time and she has been changing positions slowly. EKG done and routed to provider for review.

## 2024-01-05 NOTE — Patient Instructions (Signed)
 Your physician recommends that you continue on your current medications as directed. Please refer to the Current Medication list given to you today.  Continue same follow up plan.

## 2024-01-07 ENCOUNTER — Encounter: Payer: Self-pay | Admitting: Family Medicine

## 2024-01-07 ENCOUNTER — Ambulatory Visit (INDEPENDENT_AMBULATORY_CARE_PROVIDER_SITE_OTHER): Admitting: Family Medicine

## 2024-01-07 ENCOUNTER — Ambulatory Visit: Payer: Self-pay | Admitting: *Deleted

## 2024-01-07 VITALS — BP 116/71 | HR 86 | Resp 18 | Ht 67.0 in | Wt 207.0 lb

## 2024-01-07 DIAGNOSIS — M7122 Synovial cyst of popliteal space [Baker], left knee: Secondary | ICD-10-CM

## 2024-01-07 DIAGNOSIS — I10 Essential (primary) hypertension: Secondary | ICD-10-CM | POA: Diagnosis not present

## 2024-01-07 DIAGNOSIS — M5410 Radiculopathy, site unspecified: Secondary | ICD-10-CM

## 2024-01-07 MED ORDER — OXYCODONE-ACETAMINOPHEN 5-325 MG PO TABS: 1.0000 | ORAL_TABLET | Freq: Three times a day (TID) | ORAL | 0 refills | Status: AC | PRN

## 2024-01-07 NOTE — Assessment & Plan Note (Signed)
 Pain , swel;ling tenderness, urgent referral to Ortho and once dAILY PERCOCET  PRESCRIBED

## 2024-01-07 NOTE — Patient Instructions (Signed)
 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

## 2024-01-07 NOTE — Assessment & Plan Note (Signed)
 Omce daily percocet x 15 days, urgent referral  to Ortho

## 2024-01-07 NOTE — Telephone Encounter (Signed)
 Copied from CRM [id number]. Topic: Clinical - Red Word Triage >> Jan 07, 2024  8:11 AM Gery Pray wrote: Red Word that prompted transfer to Nurse Triage: Patient that she is having excruciating pain in her left leg. She states that the pain is an 8 on a scale of 1-10 with 10 being the worse. Patient has a baker's cyst on the left leg that is causing the pain. She states that her leg is trying to buckle underneath her. Patient states that she is having to walk with a cane due to inability to put weight on her left leg due to the pain. Patient is taking HYDROcodone fand Tylenol for the pain. Reason for Disposition  [1] MODERATE pain (e.g., interferes with normal activities, limping) AND [2] present > 3 days  Answer Assessment - Initial Assessment Questions 1. LOCATION and RADIATION: "Where is the pain located?"      I'm having pain in my left knee.   I have a Baker's cyst.  I'm on blood thinners.  I have a blood clot.   Jan 30 I saw the dr and ended up in the hospital.   I had PE in both lungs and my leg.   I ended up in the hospital.  I just thought I had a cold at that time.  March 20 I was sent to [facility] for an ultrasound of my left leg from the dr's office via ambulance.   I had a blood clots in both of my legs. 2. QUALITY: "What does the pain feel like?"  (e.g., sharp, dull, aching, burning)     I'm having pain in left knee from the Backer's cyst.   It's behind my knee.  3. SEVERITY: "How bad is the pain?" "What does it keep you from doing?"   (Scale 1-10; or mild, moderate, severe)   -  MILD (1-3): doesn't interfere with normal activities    -  MODERATE (4-7): interferes with normal activities (e.g., work or school) or awakens from sleep, limping    -  SEVERE (8-10): excruciating pain, unable to do any normal activities, unable to walk     Moderate pain. 4. ONSET: "When did the pain start?" "Does it come and go, or is it there all the time?"     It began 3/19.   I noticed it was sore.   Before I saw Dr. Allena Katz the last time.   5. RECURRENT: "Have you had this pain before?" If Yes, ask: "When, and what happened then?"     Not asked 6. SETTING: "Has there been any recent work, exercise or other activity that involved that part of the body?"      No 7. AGGRAVATING FACTORS: "What makes the knee pain worse?" (e.g., walking, climbing stairs, running)     Baker's cyst. I don't drive.   [facility] transportation is arranged for me.    8. ASSOCIATED SYMPTOMS: "Is there any swelling or redness of the knee?"     No   9. OTHER SYMPTOMS: "Do you have any other symptoms?" (e.g., chest pain, difficulty breathing, fever, calf pain)     This pain has been going on for awhile.  10. PREGNANCY: "Is there any chance you are pregnant?" "When was your last menstrual period?"       Not asked  Protocols used: Knee Pain-A-AH  Chief Complaint: Pain in left knee from Baker's Cyst.  I can't hardly walk.    Symptoms: Pain behind my left knee Frequency:  For a while now Pertinent Negatives: Patient denies injuries Disposition: [] ED /[] Urgent Care (no appt availability in office) / [x] Appointment(In office/virtual)/ []  [place] [facility]/ [] Home Care/ [] Refused Recommended Disposition /[] [place] [facility]/ []  Follow-up with PCP Additional Notes: Needs an appt.   [place] arranges her transportation so I've sent this to [facility] for arrangement for transportation per pt.    "The office makes my transportation arrangements".    Pt agreeable to this plan.

## 2024-01-08 ENCOUNTER — Encounter: Payer: Self-pay | Admitting: Family Medicine

## 2024-01-08 NOTE — Assessment & Plan Note (Signed)
 Controlled, no change in medication

## 2024-01-08 NOTE — Progress Notes (Signed)
   [name]     MRN: [id number]      DOB: May 10, 1951  Chief Complaint  Patient presents with   Leg Pain    Complains of pain and weakness in left leg x2 weeks. Believes it is a cyst. Has taken oxycodone and alternated heat/ice to manage the pain. Pain increases with movement and bearing weight.     HPI [name] is here with above complaint/concern of 2 weeks duration  Was seen on 3/20 b her PCPwith  c/o LLE psain , imaging for DVT showed Baker's cyst Record review also shows that she does  have moderate lumbar stenosis and severe facet arthrosis Denies any new weakness , numbness or incontinence, the pain is just unbearable esp when sitting or lyinf down  ROS See HPI Denies skin break down or rash.   PE  BP 116/71   Pulse 86   Resp 18   Ht 5\' 7"  (1.702 m)   Wt 207 lb (93.9 kg)   SpO2 97%   BMI 32.42 kg/m   Patient alert and oriented and in no cardiopulmonary distress.Pt in pain , unable to sit without pain  HEENT: No facial asymmetry, EOMI,     Neck supple .  Chest: Clear to auscultation bilaterally.  CVS: S1, S2 no murmurs, no S3.Regular rate.   MS: decreased  ROM lumbar spine, and left knee.Tender posterior left knee  Psych: Good eye contact, anxious not  depressed appearing.  CNS: CN 2-12 intact, power,  normal throughout.no focal deficits noted.   Assessment & Plan  Back pain with left-sided radiculopathy Omce daily percocet x 15 days, urgent referral  to Ortho  Baker's cyst of knee, left Pain , swel;ling tenderness, urgent referral to Ortho and once dAILY PERCOCET  PRESCRIBED  Essential hypertension, benign Controlled, no change in medication

## 2024-01-12 ENCOUNTER — Ambulatory Visit (INDEPENDENT_AMBULATORY_CARE_PROVIDER_SITE_OTHER): Admitting: Orthopaedic Surgery

## 2024-01-12 ENCOUNTER — Encounter: Payer: Self-pay | Admitting: Orthopaedic Surgery

## 2024-01-12 VITALS — BP 152/89 | HR 52 | Ht 67.0 in | Wt 206.0 lb

## 2024-01-12 DIAGNOSIS — G8929 Other chronic pain: Secondary | ICD-10-CM

## 2024-01-12 DIAGNOSIS — M25562 Pain in left knee: Secondary | ICD-10-CM

## 2024-01-12 NOTE — Progress Notes (Signed)
 My knee has hurt  She has had pulmonary embolization since I last saw her.  She was very ill.  She is much better now and is on Eliquis.  She had recent knee pain on the left.  Doppler study was done and showed baker's cyst on the left.  She had knee pain and swelling then but it has since gone away after the appointment was made.  She is doing well with the left knee now.  She has no giving way.  She has brain fog from the embolization.  Left knee has crepitus, ROM 0 to 110, slight effusion, no real pain, good gait, NV intact, no distal edema.  Encounter Diagnosis  Name Primary?   Chronic pain of left knee Yes   She is to call when the pain gets worse.  I suggested some puzzles to do for her brain fog.  Return prn.  Call if any problem.  Precautions discussed.  Electronically Signed Darreld Mclean, MD 4/9/20259:35 AM

## 2024-01-27 ENCOUNTER — Ambulatory Visit: Admitting: Nurse Practitioner

## 2024-01-27 ENCOUNTER — Ambulatory Visit: Attending: Student | Admitting: Student

## 2024-01-27 ENCOUNTER — Encounter: Payer: Self-pay | Admitting: Student

## 2024-01-27 VITALS — BP 110/60 | HR 80 | Ht 67.0 in | Wt 206.0 lb

## 2024-01-27 DIAGNOSIS — I2585 Chronic coronary microvascular dysfunction: Secondary | ICD-10-CM | POA: Insufficient documentation

## 2024-01-27 DIAGNOSIS — I2699 Other pulmonary embolism without acute cor pulmonale: Secondary | ICD-10-CM | POA: Diagnosis not present

## 2024-01-27 DIAGNOSIS — I10 Essential (primary) hypertension: Secondary | ICD-10-CM | POA: Insufficient documentation

## 2024-01-27 DIAGNOSIS — E782 Mixed hyperlipidemia: Secondary | ICD-10-CM | POA: Insufficient documentation

## 2024-01-27 MED ORDER — APIXABAN 5 MG PO TABS: 5.0000 mg | ORAL_TABLET | Freq: Two times a day (BID) | ORAL | 3 refills | Status: DC

## 2024-01-27 MED ORDER — RANOLAZINE ER 500 MG PO TB12: 500.0000 mg | ORAL_TABLET | Freq: Two times a day (BID) | ORAL | 3 refills | Status: DC

## 2024-01-27 NOTE — Progress Notes (Signed)
 Cardiology Office Note    Date:  01/27/2024  ID:  [name], DOB 11-19-1950, MRN [id number] Cardiologist: Teddie Favre, MD    History of Present Illness:    [name] is a 73 y.o. 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  She was last examined by Lasalle Pointer, NP in 12/2023 reported having constant, left-sided chest pain over the past few months which would radiate to her left arm on occasion. Was concerned about a recurrent blockage at that time. Her case was reviewed with Dr. Amanda Jungling (DOD) and given her history of chest pain and felt to be due to microvascular ischemia, it was recommended to obtain a limited echocardiogram and start Ranexa  500 mg twice daily.  She was evaluated in the ED the following day for chest pain and D-dimer was negative and Hs troponin values were negative at 7 and 6. Pain was reproducible on palpation and was overall consistent with chest wall pain. She was provided with Percocet and discharged home. She did have a nurse visit for a follow-up EKG on 01/05/2024 and this showed NSR, HR 75 with TWI along anterior leads which was similar to prior tracings. Repeat limited echo on 01/05/2024 showed her EF was at 65-70% with no regional WMA. She did have Grade 1 DD, normal RV function and trivial MR.   In talking with the patient and her family member today, she reports her episodes of chest pain resolved approximately 4 to 5 days after starting Ranexa . No recurrence since. Reports her breathing has been stable with no specific dyspnea on exertion, orthopnea or PND. She initially talks about lower extremity edema but  upon further investigation, this is mostly isolated along her left knee and is not along her lower legs. She has been evaluated by orthopedics in the past and declined intervention due to her fear of injections. Remains on Eliquis  for anticoagulation with no reports of active bleeding. Does report having intermittent memory fog since her PE in 10/2023 and she feels like this is possibly due to hypoxia at that time.CT Head in 12/2023 showed no acute intracranial abnormalities.  Studies Reviewed:   EKG: EKG is not ordered today.  NST: 02/2021 There was no ST segment deviation noted during stress. The left ventricular ejection fraction is hyperdynamic (>65%). This is a low risk study. The study is normal. There are no perfusion defects  Limited Echo: 01/2024 IMPRESSIONS     1. Left ventricular ejection fraction, by estimation, is 65 to 70%. The  left ventricle has normal function. The left ventricle has no regional  wall motion abnormalities. Left ventricular diastolic parameters are  consistent with Grade I diastolic  dysfunction (impaired relaxation).   2. Right ventricular systolic function is normal. The right ventricular  size is mildly enlarged.   3. The mitral valve is grossly normal. Trivial mitral valve  regurgitation.   4. The aortic valve is tricuspid. There is mild calcification of the  aortic valve. Aortic valve regurgitation is not visualized.   5. The inferior vena cava is normal in size with greater than 50%  respiratory variability, suggesting right atrial pressure of 3 mmHg.  Comparison(s): A prior study was performed on 11/04/2023. Prior images  reviewed side by side. LVEF vigorous at 65-70%. Mildly dilated RV with  normal contraction.    Physical Exam:   VS:  BP 110/60   Pulse 80   Ht 5\' 7"  (1.702 m)   Wt 206 lb (93.4 kg)   SpO2 98%   BMI 32.26 kg/m    Wt Readings from Last 3 Encounters:  01/27/24 206 lb (93.4 kg)  01/12/24 206 lb (93.4 kg)  01/07/24  207 lb (93.9 kg)     GEN: Well nourished, well developed female appearing in no acute distress NECK: No JVD; No carotid bruits CARDIAC: RRR, no murmurs, rubs, gallops RESPIRATORY:  Clear to auscultation without rales, wheezing or rhonchi  ABDOMEN: Appears non-distended. No obvious abdominal masses. EXTREMITIES: No clubbing or cyanosis. No pitting edema.  Distal pedal pulses are 2+ bilaterally.   Assessment and Plan:   1. 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 Recent limited echo earlier this month showed a preserved EF of 65-70% with no regional WMA.  - Given no recurrent symptoms following initiation of medical therapy, would continue with Ranexa  500mg  BID for now. Previously not started on Imdur due to history of migraine headaches.   2. HTN - BP is well-controlled at 110/60 during today's visit. Continue current medical therapy with Irbesartan  75mg  daily.   3. HLD - LDL was at 119 when checked in 10/2023. She has been intolerant to statins due to myalgias and elevated CK. Remains on Fenofibrate  145mg  daily.   4. PE/DVT - CTA in 10/2023 showed multiple bilateral PE's and initially treated with IV Heparin  then transitioned to Eliquis . Lower extremity dopplers in 10/2023 were also positive for bilateral DVT's and these had resolved by repeat imaging in 12/2023. Previously recommended to be on lifelong anticoagulation given recurrent events and will continue Eliquis  5mg  BID.   Signed, Dorma Gash, PA-C

## 2024-01-27 NOTE — Patient Instructions (Addendum)

## 2024-02-02 ENCOUNTER — Ambulatory Visit (INDEPENDENT_AMBULATORY_CARE_PROVIDER_SITE_OTHER): Admitting: Orthopaedic Surgery

## 2024-02-02 ENCOUNTER — Encounter: Payer: Self-pay | Admitting: Orthopaedic Surgery

## 2024-02-02 ENCOUNTER — Encounter: Payer: Self-pay | Admitting: Internal Medicine

## 2024-02-02 ENCOUNTER — Ambulatory Visit (INDEPENDENT_AMBULATORY_CARE_PROVIDER_SITE_OTHER): Payer: Medicare Other | Admitting: Internal Medicine

## 2024-02-02 VITALS — BP 118/67 | HR 85 | Ht 67.0 in | Wt 205.0 lb

## 2024-02-02 DIAGNOSIS — F5104 Psychophysiologic insomnia: Secondary | ICD-10-CM | POA: Diagnosis not present

## 2024-02-02 DIAGNOSIS — G8929 Other chronic pain: Secondary | ICD-10-CM

## 2024-02-02 DIAGNOSIS — Z792 Long term (current) use of antibiotics: Secondary | ICD-10-CM | POA: Diagnosis not present

## 2024-02-02 DIAGNOSIS — J4530 Mild persistent asthma, uncomplicated: Secondary | ICD-10-CM

## 2024-02-02 DIAGNOSIS — R404 Transient alteration of awareness: Secondary | ICD-10-CM | POA: Diagnosis not present

## 2024-02-02 DIAGNOSIS — M25562 Pain in left knee: Secondary | ICD-10-CM

## 2024-02-02 DIAGNOSIS — I2699 Other pulmonary embolism without acute cor pulmonale: Secondary | ICD-10-CM | POA: Diagnosis not present

## 2024-02-02 DIAGNOSIS — H401130 Primary open-angle glaucoma, bilateral, stage unspecified: Secondary | ICD-10-CM | POA: Diagnosis not present

## 2024-02-02 DIAGNOSIS — E039 Hypothyroidism, unspecified: Secondary | ICD-10-CM

## 2024-02-02 MED ORDER — HYDROCODONE-ACETAMINOPHEN 5-325 MG PO TABS: 1.0000 | ORAL_TABLET | ORAL | 0 refills | Status: AC | PRN

## 2024-02-02 MED ORDER — BRIMONIDINE TARTRATE 0.2 % OP SOLN: 1.0000 [drp] | Freq: Two times a day (BID) | OPHTHALMIC | 3 refills | Status: DC

## 2024-02-02 MED ORDER — AMOXICILLIN 500 MG PO CAPS: ORAL_CAPSULE | ORAL | 0 refills | Status: DC

## 2024-02-02 MED ORDER — BRIMONIDINE TARTRATE 0.2 % OP SOLN: 1.0000 [drp] | Freq: Two times a day (BID) | OPHTHALMIC | 3 refills | Status: AC

## 2024-02-02 MED ORDER — METHYLPREDNISOLONE ACETATE 40 MG/ML IJ SUSP: 40.0000 mg | Freq: Once | INTRAMUSCULAR | Status: DC

## 2024-02-02 MED ORDER — LATANOPROST 0.005 % OP SOLN
1.0000 [drp] | Freq: Every day | OPHTHALMIC | 3 refills | Status: AC
Start: 2024-02-02 — End: ?

## 2024-02-02 MED ORDER — LATANOPROST 0.005 % OP SOLN: 1.0000 [drp] | Freq: Every day | OPHTHALMIC | 3 refills | Status: DC

## 2024-02-02 MED ORDER — METHYLPREDNISOLONE ACETATE 40 MG/ML IJ SUSP
40.0000 mg | Freq: Once | INTRAMUSCULAR | Status: AC
  Administered 2024-02-02: 40 mg via INTRA_ARTICULAR

## 2024-02-02 NOTE — Assessment & Plan Note (Signed)
 Unclear significance, can be mild cognitive decline and/or lack of sleep related confusion Advised to continue to engage in conversations with family members and friends, play puzzles or word games and maintain adequate hydration

## 2024-02-02 NOTE — Patient Instructions (Signed)
Please continue to take medications as prescribed. ? ?Please continue to follow low salt diet and ambulate as tolerated. ?

## 2024-02-02 NOTE — Assessment & Plan Note (Signed)
 Planned to get dental procedure, advised to delay nonurgent dental procedure for now as she should not hold Eliquis  currently She is medically optimized for dental cleaning, does not need to hold Eliquis  for it Amoxicillin  2 g as prophylaxis

## 2024-02-02 NOTE — Assessment & Plan Note (Signed)
 Has had asthma exacerbation/acute bronchitis recently, completed Medrol  dosepak Better with Trelegy now Albuterol  PRN for dyspnea or wheezing Has albuterol  nebulizer as well as she responds better to nebulization Tussionex PRN for cough Referred to pulmonology

## 2024-02-02 NOTE — Assessment & Plan Note (Signed)
 Continue Eliquis 5 mg twice daily Planned to see Heme./Onc.  Considering recurrent pulmonary embolism, history of G6PD mutation and sickle cell trait Evaluated by pulmonology for recurrent PE and asthma

## 2024-02-02 NOTE — Addendum Note (Signed)
 Addended by: [place] Snow T on: 02/02/2024 04:21 PM   Modules accepted: Orders

## 2024-02-02 NOTE — Progress Notes (Signed)
 PROCEDURE NOTE:  The patient requests injections of the left knee , verbal consent was obtained.  The left knee was prepped appropriately after time out was performed.   Sterile technique was observed and injection of 1 cc of DepoMedrol 40 mg with several cc's of plain xylocaine . Anesthesia was provided by ethyl chloride and a 20-gauge needle was used to inject the knee area. The injection was tolerated well.  A band aid dressing was applied.  The patient was advised to apply ice later today and tomorrow to the injection sight as needed.  Encounter Diagnosis  Name Primary?   Chronic pain of left knee Yes   I have reviewed the [place]  Controlled Substance Reporting System web site prior to prescribing narcotic medicine for this patient.  Return prn.  Call if any problem.  Precautions discussed.  Electronically Signed Pleasant Brilliant, MD 4/30/20253:17 PM

## 2024-02-02 NOTE — Assessment & Plan Note (Addendum)
 Usually well-controlled, recently worse due to multiple office visits and increasing stress due to medical conditions Takes Ambien  10 mg qHS PRN Has Xanax  1 mg as needed for anxiety/panic episodes, she does not take Ambien  and Xanax  together

## 2024-02-02 NOTE — Addendum Note (Signed)
 Addended by: Maryland Snow T on: 02/02/2024 04:30 PM   Modules accepted: Orders

## 2024-02-02 NOTE — Assessment & Plan Note (Signed)
 Lab Results  Component Value Date   TSH 1.393 11/03/2023   Was on Levothyroxine 25 mcg once daily, recently discontinued due to elevated free T4 Follows up with Dr Fransico Him Checked TSH and free T4

## 2024-02-02 NOTE — Assessment & Plan Note (Signed)
 Followed by ophthalmology Has latanoprost  and brimonidine  eyedrops, refilled for now Her recent changes in vision could be due to macular degeneration - needs an ophthalmology evaluation

## 2024-02-02 NOTE — Addendum Note (Signed)
 Addended by: [place] Snow T on: 02/02/2024 04:32 PM   Modules accepted: Orders

## 2024-02-02 NOTE — Progress Notes (Signed)
 Established Patient Office Visit  Subjective:  Patient ID: [name], female    DOB: Jan 18, 1951  Age: 73 y.o. MRN: [id number]  CC:  Chief Complaint  Patient presents with   Medical Management of Chronic Issues    4 month f/u , reports sx of brain fog since January 2024, has concerns. Has a dentist appointment needs clearance to approve cleaning also needs an antibiotic.     HPI [name] is a 73 y.o. female with past medical history of HTN, GERD, IBS-C, NASH, anal fissure, hypothyroidism, OA, DDD of lumbar spine, anxiety/panic disorder, insomnia, left sided blind eye, G6PD mutation and sickle cell trait who presents for f/u of her chronic medical conditions.  HTN: BP is well-controlled. Takes medications regularly. Patient denies dizziness, chest pain, dyspnea or palpitations.  PE: She is taking Eliquis  5 mg twice daily currently.  Denies any signs of active bleeding.  Has appt with Hematology clinic for it.  Asthma: She has started using Trelegy instead of Symbicort , and has felt better now.  Denies any dyspnea or wheezing currently.  She was recently treated with Medrol  Dosepak for acute bronchitis. Denies any fever or chills.  She has noticed an improvement in daily headache recently, which is generalized, and worse with stress. She takes Maxalt  as needed for migraine.  She had neurology evaluation and and was given gabapentin , but she did not try it after reading its side effects.  She has tried Tylenol  for it with some relief. Denies any focal numbness or tingling. She has Xanax  for severe anxiety/panic episode, but does not require it frequently.  She denies anhedonia, SI or HI. She takes Ambien  for insomnia ,but still reports insomnia recently.  She has history of DDD of lumbosacral spine.  She has tried taking Norco for severe pain with relief.  Denies any recent injury or fall.  Denies saddle anesthesia, urinary or stool incontinence.  She has very limited vision in  the right eye after cataract surgery (tunnel vision).  She has been having difficulty ambulating even at home, but denies any recent fall.  She has recently noticed peripheral vision loss and has gray spot in the center.  She is followed by ophthalmology.  Denies any eye pain or discharge currently.   She has had change in awareness episodes twice since 01/25. She was not able to think of alphabets while watching TV show on one day, but was able to do them on her phone after few minutes. She has been hospitalized for PE in 01/25 and has been under a lot of stress due to different medical issues, including PE, leg swelling and bilateral knee pain.  She has had insomnia recently as well, which can cause confusion episodes as well.  She reports that she is trying to play puzzles regularly.  Her family members have not changed any change in behavior recently.   Past Medical History:  Diagnosis Date   Anal fissure    Anxiety    Arthritis    Asthma    C. difficile diarrhea 09/17/2021   Cataract    Phreesia 01/05/2021   Chest pain    a. 2000/03/08 Cath: nl cors, EF 60%;  b. 10/2010 MV: nl LV, no ischemia/infarct;  c. 03/2014 Admit c/p, r/o->grief from husbands death.   Chronic RUQ pain Mar 08, 2008   EUS slightly dilated CBD (7.52mm), otherwise nl   Depression    Diverticulosis    Eczema    Exposure to chemical inhalation mid 1970s  Fatty liver    G6PD deficiency    Gallstones    GERD (gastroesophageal reflux disease)    Glaucoma    History of pulmonary embolus (PE)    Treated with Eliquis  2016   HTN (hypertension)    Hypercholesteremia    a. intolerant to statins.   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 She has limited vision in the right eye.   Migraines    inner ocular   Ocular migraine    Palpitations    Pleurisy    Pneumonia 07/16/2021   Patient was exposed to a harsh chemical in the 1970's that  created scar tissue in her lungs. She has had pneumonia several times in the past.   PONV (postoperative nausea and vomiting)    Pulmonary emboli (HCC)    Recurrent upper respiratory infection (URI)    Renal cyst    Retinal cyst    Sickle cell trait (HCC)    Tubular adenoma of colon 09/17/2011   Urticaria     Past Surgical History:  Procedure Laterality Date   ABDOMINAL HYSTERECTOMY     ADENOIDECTOMY     ANGIOPLASTY     APPENDECTOMY     BIOPSY N/A 03/14/2013   Procedure: SMALL BOWEL AND GASTRIC BIOPSIES (Procedure #1);  Surgeon: Alyce Jubilee, MD;  Location: [facility];  Service: Endoscopy;  Laterality: N/A;   CARDIAC CATHETERIZATION Left 02/11/2000   CATARACT EXTRACTION Left    CATARACT EXTRACTION W/PHACO Right 03/19/2021   Procedure: CATARACT EXTRACTION PHACO AND INTRAOCULAR LENS PLACEMENT (IOC) RIGHT 6.75 00:50.4;  Surgeon: Annell Kidney, MD;  Location: [facility];  Service: Ophthalmology;  Laterality: Right;   CHOLECYSTECTOMY  12/2007   COLONOSCOPY  12/22/2010   WJX:BJYNWG, cecal adenomatous polyp   COLONOSCOPY WITH PROPOFOL  N/A 03/31/2016   Dr. Nolene Baumgarten: four sessile polyps rectum/sigmoid colon, diverticulosi, ext/int hemorrhoids. hyperplastic polyps, next tcs 5 years.   COLONOSCOPY WITH PROPOFOL  N/A 04/21/2021   anal fissure, non-bleeding internal hemorrhoids, right colon diverticulosis, one 2 mm polyp in ascending, three 4-6 mm polyps in descending colon. Tubular adenomas. 5 year surveillance.   complete hysterectomy     ENTEROSCOPY N/A 03/14/2013   NFA:OZHY gastritis/ulcers has healed   ESOPHAGOGASTRODUODENOSCOPY  09/22/2011   QMV:HQIO gastritis/Duodenitis   EXCISIONAL HEMORRHOIDECTOMY     FLEXIBLE SIGMOIDOSCOPY N/A 03/14/2013   SLF:3 colon polyp removed/moderate sized internal hemorrhoids   FLEXIBLE SIGMOIDOSCOPY N/A 06/09/2016   Procedure: FLEXIBLE SIGMOIDOSCOPY;  Surgeon: Alyce Jubilee, MD;  Location: [facility];  Service: Endoscopy;  Laterality: N/A;   rectal polyps times 2   FLEXIBLE SIGMOIDOSCOPY N/A 03/18/2018   Procedure: FLEXIBLE SIGMOIDOSCOPY;  Surgeon: Alyce Jubilee, MD;  Location: [facility];  Service: Endoscopy;  Laterality: N/A;  9:15am   FOOT SURGERY     bunion removal left   GIVENS CAPSULE STUDY N/A 02/10/2013   Procedure: GIVENS CAPSULE STUDY;  Surgeon: Alyce Jubilee, MD;  Location: [facility];  Service: Endoscopy;  Laterality: N/A;  730   HEEL SPUR EXCISION     right    HEMORRHOID BANDING N/A 03/14/2013   Procedure: HEMORRHOID BANDING (Procedure #3)  3 bands applied [id number]#[id number] Exp 02/02/2014 ;  Surgeon: Alyce Jubilee, MD;  Location: [facility];  Service: Endoscopy;  Laterality: N/A;   HEMORRHOID SURGERY N/A 04/24/2016   Procedure: EXTENSIVE HEMORRHOIDECTOMY;  Surgeon: Franki Isles,  MD;  Location: [facility];  Service: General;  Laterality: N/A;   LAPAROSCOPY     adhesions   POLYPECTOMY N/A 03/14/2013   ZOX:WRUE Gastritis . ULCERS SEEN ON MAY 6 HAVE HEALED   POLYPECTOMY  03/31/2016   Procedure: POLYPECTOMY;  Surgeon: Alyce Jubilee, MD;  Location: [facility];  Service: Endoscopy;;  sigmoid colon polyps x2, rectal polyps x2   POLYPECTOMY  04/21/2021   Procedure: POLYPECTOMY;  Surgeon: Vinetta Greening, DO;  Location: [facility];  Service: Endoscopy;;   RECTAL EXAM UNDER ANESTHESIA N/A 07/23/2021   Procedure: RECTAL EXAM UNDER ANESTHESIA;  Surgeon: Joyce Nixon, MD;  Location[facility];  Service: General;  Laterality: N/A;   SPHINCTEROTOMY N/A 07/23/2021   Procedure: CHEMICAL SPHINCTEROTOMY;  Surgeon: Joyce Nixon, MD;  Location[facility];  Service: General;  Laterality: N/A;   TONSILLECTOMY     TUBAL LIGATION      Family History  Problem Relation Age of Onset   Colon cancer Mother        49s   Urticaria Mother    Heart disease Mother    Colon polyps Mother    Cancer Father        oral   Crohn's disease Sister    Cancer Sister        around heart   Colon  polyps Sister    Diabetes Brother    Other Brother        intestinal sugery   Colon polyps Brother    Colon cancer Maternal Grandmother    Colon cancer Maternal Grandfather    Colon cancer Paternal Grandfather    Anesthesia problems Neg Hx     Social History   Socioeconomic History   Marital status: Widowed    Spouse name: Not on file   Number of children: 1   Years of education: Not on file   Highest education level: Not on file  Occupational History   Occupation: homemaker    Employer: UNEMPLOYED  Tobacco Use   Smoking status: Former    Current packs/day: 0.00    Average packs/day: 0.5 packs/day for 30.0 years (15.0 ttl pk-yrs)    Types: Cigarettes    Start date: 10/15/1966    Quit date: 10/15/1996    Years since quitting: 27.3   Smokeless tobacco: Never   Tobacco comments:    Stopped smoking ~ 2000  Vaping Use   Vaping status: Never Used  Substance and Sexual Activity   Alcohol use: Not Currently    Alcohol/week: 4.0 standard drinks of alcohol    Types: 4 Glasses of wine per week    Comment: 1 drinks every couple of weeks   Drug use: No   Sexual activity: Not Currently    Birth control/protection: Surgical  Other Topics Concern   Not on file  Social History Narrative   Does not routinely exercise. Husband passed in JUN 2015 due to prostate ca.   Social Drivers of Corporate investment banker Strain: Low Risk  (06/11/2023)   Overall Financial Resource Strain (CARDIA)    Difficulty of Paying Living Expenses: Not hard at all  Food Insecurity: No Food Insecurity (11/04/2023)   Hunger Vital Sign    Worried About Running Out of Food in the Last Year: Never true    Ran Out of Food in the Last Year: Never true  Transportation Needs: Unmet Transportation Needs (12/30/2023)   PRAPARE - Transportation    Lack of Transportation (  Medical): Yes    Lack of Transportation (Non-Medical): Yes  Physical Activity: Sufficiently Active (06/11/2023)   Exercise Vital Sign    Days of  Exercise per Week: 6 days    Minutes of Exercise per Session: 30 min  Stress: No Stress Concern Present (06/11/2023)   [employer] of Occupational Health - Occupational Stress Questionnaire    Feeling of Stress : Not at all  Social Connections: Socially Isolated (11/04/2023)   Social Connection and Isolation Panel [NHANES]    Frequency of Communication with Friends and Family: Three times a week    Frequency of Social Gatherings with Friends and Family: Once a week    Attends Religious Services: Never    Database administrator or Organizations: No    Attends Banker Meetings: Never    Marital Status: Widowed  Intimate Partner Violence: Not At Risk (11/04/2023)   Humiliation, Afraid, Rape, and Kick questionnaire    Fear of Current or Ex-Partner: No    Emotionally Abused: No    Physically Abused: No    Sexually Abused: No    Outpatient Medications Prior to Visit  Medication Sig Dispense Refill   albuterol  (PROVENTIL ) (2.5 MG/3ML) 0.083% nebulizer solution Take 3 mLs (2.5 mg total) by nebulization every 4 (four) hours as needed for wheezing or shortness of breath. 150 mL 3   ALPRAZolam  (XANAX ) 1 MG tablet Take 1 tablet (1 mg total) by mouth 2 (two) times daily as needed for anxiety. 180 tablet 0   apixaban  (ELIQUIS ) 5 MG TABS tablet Take 1 tablet (5 mg total) by mouth 2 (two) times daily. 180 tablet 3   chlorpheniramine-HYDROcodone  (TUSSIONEX) 10-8 MG/5ML Take 5 mLs by mouth every 12 (twelve) hours as needed for cough. 115 mL 0   Cholecalciferol (VITAMIN D3) 50 MCG (2000 UT) TABS Take 2,000 Units by mouth daily.     dexlansoprazole  (DEXILANT ) 60 MG capsule Take 1 capsule (60 mg total) by mouth daily. 90 capsule 3   fenofibrate  (TRICOR ) 145 MG tablet Take 1 tablet (145 mg total) by mouth daily. 90 tablet 3   Fluticasone -Umeclidin-Vilant (TRELEGY ELLIPTA ) 100-62.5-25 MCG/ACT AEPB Inhale 1 puff into the lungs daily. 3 each 4   irbesartan  (AVAPRO ) 150 MG tablet Take 0.5 tablets  (75 mg total) by mouth daily. 45 tablet 1   Magnesium  500 MG TABS Take 1 tablet by mouth 3 (three) times daily with meals.     meclizine  (ANTIVERT ) 12.5 MG tablet Take 1 tablet (12.5 mg total) by mouth 3 (three) times daily as needed for dizziness. 30 tablet 3   nitroGLYCERIN  (NITROLINGUAL ) 0.4 MG/SPRAY spray Place 1 spray under the tongue every 5 (five) minutes as needed for chest pain. 12 g 3   NON FORMULARY Take 1 tablet by mouth daily as needed (Stomach Bloat). FD GUARD     Nutritional Supplements (IBS SUPPORT PO) Take 1 tablet by mouth daily as needed (stomach aches). FD Guard and IB Guard Peppermint/Fennel seeds     ondansetron  (ZOFRAN ) 4 MG tablet TAKE ONE TABLET BY MOUTH EVERY 8 HOURS AS NEEDED FOR NAUSEA OR VOMITING 30 tablet 1   oxyCODONE -acetaminophen  (PERCOCET/ROXICET) 5-325 MG tablet Take 1 tablet by mouth every 6 (six) hours as needed for severe pain (pain score 7-10). 15 tablet 0   Probiotic Product (PROBIOTIC DAILY PO) Take 1 capsule by mouth daily. VSL #3     ranolazine  (RANEXA ) 500 MG 12 hr tablet Take 1 tablet (500 mg total) by mouth 2 (two) times daily.  180 tablet 3   rizatriptan  (MAXALT -MLT) 10 MG disintegrating tablet DISSOLVE ONE TABLET ON TONGUE AS NEEDED FOR MIGRAINE. MAY REPEAT IN 2 HOURS IF NEEDED 9 tablet 6   zolpidem  (AMBIEN ) 10 MG tablet Take 1 tablet (10 mg total) by mouth at bedtime as needed for sleep. 90 tablet 1   brimonidine  (ALPHAGAN ) 0.2 % ophthalmic solution Place 1 drop into both eyes 2 (two) times daily. 15 mL 3   latanoprost  (XALATAN ) 0.005 % ophthalmic solution Place 1 drop into both eyes at bedtime. 9 mL 3   Phenyleph-Doxylamine-DM-APAP (TYLENOL  COLD/FLU/COUGH NIGHT PO) Take 2 tablets by mouth daily as needed (cough/cold).     No facility-administered medications prior to visit.    Allergies  Allergen Reactions   Nutmeg Oil (Myristica Oil) Anaphylaxis    Nut oil- skin irritation   Adhesive [Tape] Other (See Comments)    Blisters skin   Aspirin  Other (See Comments)    sickle cell trait--  Not recommended unless emergency   Imitrex [Sumatriptan] Hives   Keflex  [Cephalexin ] Other (See Comments)    G6PD   Levaquin  [Levofloxacin  In D5w]     Altered mental status Affects G6PD   Other     Nut Oil- skin irritation    Statins Other (See Comments)    Myopathy - elevated CK   Sulfa Antibiotics Other (See Comments)    Patient has sickle cell trait   Sulfonamide Derivatives Other (See Comments)    Patient has sickle cell trait   Latex Rash    ROS Review of Systems  Constitutional:  Negative for chills and fever.  HENT:  Positive for congestion. Negative for sore throat.   Eyes:  Positive for photophobia, pain and visual disturbance. Negative for discharge.  Respiratory:  Positive for cough (Intermittent). Negative for shortness of breath and wheezing.   Cardiovascular:  Negative for chest pain and palpitations.  Gastrointestinal:  Positive for constipation. Negative for abdominal pain, blood in stool, diarrhea, nausea and vomiting.  Endocrine: Negative for polydipsia and polyuria.  Genitourinary:  Negative for dysuria and hematuria.  Musculoskeletal:  Positive for arthralgias, back pain, neck pain and neck stiffness.  Skin:  Negative for rash.  Neurological:  Positive for headaches. Negative for weakness.  Psychiatric/Behavioral:  Negative for agitation and behavioral problems.       Objective:    Physical Exam Vitals reviewed.  Constitutional:      General: She is not in acute distress.    Appearance: She is not diaphoretic.  HENT:     Head: Normocephalic and atraumatic.     Nose: No congestion.     Mouth/Throat:     Mouth: Mucous membranes are moist.  Eyes:     General: No scleral icterus.    Extraocular Movements: Extraocular movements intact.  Cardiovascular:     Rate and Rhythm: Normal rate and regular rhythm.     Heart sounds: Normal heart sounds. No murmur heard. Pulmonary:     Breath sounds: Normal breath  sounds. No wheezing or rales.  Musculoskeletal:        General: Tenderness (Lower lumbar spine area) present.     Left hand: Decreased strength of thumb/finger opposition.     Cervical back: Neck supple. No tenderness.     Right lower leg: No edema.     Left lower leg: No edema.  Skin:    General: Skin is warm.     Findings: No rash.  Neurological:     General: No focal deficit  present.     Mental Status: She is alert and oriented to person, place, and time.     Sensory: No sensory deficit.     Motor: No weakness.  Psychiatric:        Mood and Affect: Mood normal.        Behavior: Behavior normal.     BP 118/67   Pulse 85   Ht 5\' 7"  (1.702 m)   Wt 205 lb (93 kg)   SpO2 94%   BMI 32.11 kg/m  Wt Readings from Last 3 Encounters:  02/02/24 205 lb (93 kg)  01/27/24 206 lb (93.4 kg)  01/12/24 206 lb (93.4 kg)    Lab Results  Component Value Date   TSH 1.393 11/03/2023   Lab Results  Component Value Date   WBC 6.2 12/28/2023   HGB 12.0 12/28/2023   HCT 36.2 12/28/2023   MCV 96.3 12/28/2023   PLT 243 12/28/2023   Lab Results  Component Value Date   NA 137 12/28/2023   K 4.1 12/28/2023   CO2 24 12/28/2023   GLUCOSE 136 (H) 12/28/2023   BUN 15 12/28/2023   CREATININE 1.19 (H) 12/28/2023   BILITOT 0.6 12/28/2023   ALKPHOS 52 12/28/2023   AST 21 12/28/2023   ALT 14 12/28/2023   PROT 7.0 12/28/2023   ALBUMIN 3.8 12/28/2023   CALCIUM 9.5 12/28/2023   ANIONGAP 11 12/28/2023   EGFR 54 (L) 11/17/2023   Lab Results  Component Value Date   CHOL 176 11/03/2023   Lab Results  Component Value Date   HDL 22 (L) 11/03/2023   Lab Results  Component Value Date   LDLCALC 119 (H) 11/03/2023   Lab Results  Component Value Date   TRIG 173 (H) 11/03/2023   Lab Results  Component Value Date   CHOLHDL 8.0 11/03/2023   Lab Results  Component Value Date   HGBA1C 5.1 (A) 11/10/2023      Assessment & Plan:   Problem List Items Addressed This Visit        Cardiovascular and Mediastinum   Recurrent pulmonary embolism (HCC)   Continue Eliquis  5 mg twice daily Planned to see Heme./Onc.  Considering recurrent pulmonary embolism, history of G6PD mutation and sickle cell trait Evaluated by pulmonology for recurrent PE and asthma        Respiratory   Asthma   Has had asthma exacerbation/acute bronchitis recently, completed Medrol  dosepak Better with Trelegy now Albuterol  PRN for dyspnea or wheezing Has albuterol  nebulizer as well as she responds better to nebulization Tussionex PRN for cough Referred to pulmonology        Endocrine   Hypothyroidism   Lab Results  Component Value Date   TSH 1.393 11/03/2023   Was on Levothyroxine  25 mcg once daily, recently discontinued due to elevated free T4 Follows up with Dr [place] Checked TSH and free T4        Other   Chronic insomnia (Chronic)   Usually well-controlled, recently worse due to multiple office visits and increasing stress due to medical conditions Takes Ambien  10 mg qHS PRN Has Xanax  1 mg as needed for anxiety/panic episodes, she does not take Ambien  and Xanax  together      Primary open angle glaucoma (POAG) of both eyes - Primary   Followed by ophthalmology Has latanoprost  and brimonidine  eyedrops, refilled for now Her recent changes in vision could be due to macular degeneration - needs an ophthalmology evaluation      Relevant Medications  brimonidine  (ALPHAGAN ) 0.2 % ophthalmic solution   latanoprost  (XALATAN ) 0.005 % ophthalmic solution   Awareness alteration, transient   Unclear significance, can be mild cognitive decline and/or lack of sleep related confusion Advised to continue to engage in conversations with family members and friends, play puzzles or word games and maintain adequate hydration      Need for antibiotic prophylaxis for dental procedure   Planned to get dental procedure, advised to delay nonurgent dental procedure for now as she should not hold  Eliquis  currently She is medically optimized for dental cleaning, does not need to hold Eliquis  for it Amoxicillin  2 g as prophylaxis      Relevant Medications   amoxicillin  (AMOXIL ) 500 MG capsule     Meds ordered this encounter  Medications   DISCONTD: brimonidine  (ALPHAGAN ) 0.2 % ophthalmic solution    Sig: Place 1 drop into both eyes 2 (two) times daily.    Dispense:  15 mL    Refill:  3    FAX TO: [facility] [phone]   DISCONTD: latanoprost  (XALATAN ) 0.005 % ophthalmic solution    Sig: Place 1 drop into both eyes at bedtime.    Dispense:  9 mL    Refill:  3    FAX TO: [facility] [phone]   brimonidine  (ALPHAGAN ) 0.2 % ophthalmic solution    Sig: Place 1 drop into both eyes 2 (two) times daily.    Dispense:  15 mL    Refill:  3   latanoprost  (XALATAN ) 0.005 % ophthalmic solution    Sig: Place 1 drop into both eyes at bedtime.    Dispense:  9 mL    Refill:  3    FAX TO: [facility], [phone].   amoxicillin  (AMOXIL ) 500 MG capsule    Sig: Take 4 capsules at once by mouth 1 hour prior to the dental procedure.    Dispense:  4 capsule    Refill:  0    Please deliver to the patient.    Follow-up: Return in about 4 months (around 06/03/2024) for HTN and asthma.    Meldon Sport, MD

## 2024-02-03 ENCOUNTER — Other Ambulatory Visit: Payer: Self-pay

## 2024-02-03 DIAGNOSIS — D649 Anemia, unspecified: Secondary | ICD-10-CM

## 2024-02-03 DIAGNOSIS — I2699 Other pulmonary embolism without acute cor pulmonale: Secondary | ICD-10-CM

## 2024-02-03 DIAGNOSIS — I824Z2 Acute embolism and thrombosis of unspecified deep veins of left distal lower extremity: Secondary | ICD-10-CM

## 2024-02-06 ENCOUNTER — Emergency Department (HOSPITAL_COMMUNITY)

## 2024-02-06 ENCOUNTER — Encounter (HOSPITAL_COMMUNITY): Payer: Self-pay | Admitting: Emergency Medicine

## 2024-02-06 ENCOUNTER — Observation Stay (HOSPITAL_COMMUNITY)
Admission: EM | Admit: 2024-02-06 | Discharge: 2024-02-07 | Disposition: A | Discharge status: Home / Self Care | Attending: Internal Medicine | Admitting: Internal Medicine

## 2024-02-06 ENCOUNTER — Other Ambulatory Visit: Payer: Self-pay

## 2024-02-06 DIAGNOSIS — M25569 Pain in unspecified knee: Secondary | ICD-10-CM | POA: Insufficient documentation

## 2024-02-06 DIAGNOSIS — Z91040 Latex allergy status: Secondary | ICD-10-CM | POA: Diagnosis not present

## 2024-02-06 DIAGNOSIS — Z8673 Personal history of transient ischemic attack (TIA), and cerebral infarction without residual deficits: Secondary | ICD-10-CM | POA: Diagnosis not present

## 2024-02-06 DIAGNOSIS — R4182 Altered mental status, unspecified: Secondary | ICD-10-CM | POA: Diagnosis not present

## 2024-02-06 DIAGNOSIS — Z043 Encounter for examination and observation following other accident: Secondary | ICD-10-CM | POA: Diagnosis not present

## 2024-02-06 DIAGNOSIS — Z86718 Personal history of other venous thrombosis and embolism: Secondary | ICD-10-CM | POA: Insufficient documentation

## 2024-02-06 DIAGNOSIS — Z87891 Personal history of nicotine dependence: Secondary | ICD-10-CM | POA: Insufficient documentation

## 2024-02-06 DIAGNOSIS — Z87898 Personal history of other specified conditions: Secondary | ICD-10-CM

## 2024-02-06 DIAGNOSIS — Z79899 Other long term (current) drug therapy: Secondary | ICD-10-CM | POA: Insufficient documentation

## 2024-02-06 DIAGNOSIS — J45909 Unspecified asthma, uncomplicated: Secondary | ICD-10-CM | POA: Insufficient documentation

## 2024-02-06 DIAGNOSIS — I2699 Other pulmonary embolism without acute cor pulmonale: Secondary | ICD-10-CM | POA: Diagnosis present

## 2024-02-06 DIAGNOSIS — D649 Anemia, unspecified: Secondary | ICD-10-CM | POA: Diagnosis not present

## 2024-02-06 DIAGNOSIS — S0990XA Unspecified injury of head, initial encounter: Secondary | ICD-10-CM | POA: Diagnosis not present

## 2024-02-06 DIAGNOSIS — R55 Syncope and collapse: Principal | ICD-10-CM | POA: Diagnosis present

## 2024-02-06 DIAGNOSIS — F32A Depression, unspecified: Secondary | ICD-10-CM | POA: Diagnosis not present

## 2024-02-06 DIAGNOSIS — G4700 Insomnia, unspecified: Secondary | ICD-10-CM | POA: Diagnosis not present

## 2024-02-06 DIAGNOSIS — I10 Essential (primary) hypertension: Secondary | ICD-10-CM | POA: Diagnosis not present

## 2024-02-06 DIAGNOSIS — F330 Major depressive disorder, recurrent, mild: Secondary | ICD-10-CM | POA: Diagnosis not present

## 2024-02-06 DIAGNOSIS — R0602 Shortness of breath: Secondary | ICD-10-CM | POA: Insufficient documentation

## 2024-02-06 DIAGNOSIS — E039 Hypothyroidism, unspecified: Secondary | ICD-10-CM | POA: Diagnosis not present

## 2024-02-06 LAB — COMPREHENSIVE METABOLIC PANEL WITH GFR
ALT: 15 U/L (ref 0–44)
AST: 20 U/L (ref 15–41)
Albumin: 3.9 g/dL (ref 3.5–5.0)
Alkaline Phosphatase: 44 U/L (ref 38–126)
Anion gap: 9 (ref 5–15)
BUN: 13 mg/dL (ref 8–23)
CO2: 25 mmol/L (ref 22–32)
Calcium: 8.8 mg/dL — ABNORMAL LOW (ref 8.9–10.3)
Chloride: 107 mmol/L (ref 98–111)
Creatinine, Ser: 1.07 mg/dL — ABNORMAL HIGH (ref 0.44–1.00)
GFR, Estimated: 56 mL/min — ABNORMAL LOW (ref >60)
Glucose, Bld: 127 mg/dL — ABNORMAL HIGH (ref 70–99)
Potassium: 3.5 mmol/L (ref 3.5–5.1)
Sodium: 141 mmol/L (ref 135–145)
Total Bilirubin: 0.7 mg/dL (ref 0.0–1.2)
Total Protein: 7 g/dL (ref 6.5–8.1)

## 2024-02-06 LAB — PROTIME-INR
INR: 1.4 — ABNORMAL HIGH (ref 0.8–1.2)
Prothrombin Time: 17.5 s — ABNORMAL HIGH (ref 11.4–15.2)

## 2024-02-06 LAB — URINALYSIS, ROUTINE W REFLEX MICROSCOPIC
Bilirubin Urine: NEGATIVE
Glucose, UA: NEGATIVE mg/dL
Hgb urine dipstick: NEGATIVE
Ketones, ur: NEGATIVE mg/dL
Nitrite: NEGATIVE
Protein, ur: NEGATIVE mg/dL
Specific Gravity, Urine: 1.01 (ref 1.005–1.030)
pH: 6 (ref 5.0–8.0)

## 2024-02-06 LAB — CBC WITH DIFFERENTIAL/PLATELET
Abs Immature Granulocytes: 0.02 10*3/uL (ref 0.00–0.07)
Basophils Absolute: 0 10*3/uL (ref 0.0–0.1)
Basophils Relative: 1 %
Eosinophils Absolute: 0.1 10*3/uL (ref 0.0–0.5)
Eosinophils Relative: 2 %
HCT: 35 % — ABNORMAL LOW (ref 36.0–46.0)
Hemoglobin: 11.9 g/dL — ABNORMAL LOW (ref 12.0–15.0)
Immature Granulocytes: 0 %
Lymphocytes Relative: 33 %
Lymphs Abs: 1.7 10*3/uL (ref 0.7–4.0)
MCH: 33.4 pg (ref 26.0–34.0)
MCHC: 34 g/dL (ref 30.0–36.0)
MCV: 98.3 fL (ref 80.0–100.0)
Monocytes Absolute: 0.3 10*3/uL (ref 0.1–1.0)
Monocytes Relative: 5 %
Neutro Abs: 2.9 10*3/uL (ref 1.7–7.7)
Neutrophils Relative %: 59 %
Platelets: 198 10*3/uL (ref 150–400)
RBC: 3.56 MIL/uL — ABNORMAL LOW (ref 3.87–5.11)
RDW: 13 % (ref 11.5–15.5)
WBC: 5 10*3/uL (ref 4.0–10.5)
nRBC: 0 % (ref 0.0–0.2)

## 2024-02-06 LAB — BRAIN NATRIURETIC PEPTIDE: B Natriuretic Peptide: 38 pg/mL (ref 0.0–100.0)

## 2024-02-06 LAB — TROPONIN I (HIGH SENSITIVITY)
Troponin I (High Sensitivity): 4 ng/L (ref <18)
Troponin I (High Sensitivity): 4 ng/L (ref <18)

## 2024-02-06 MED ORDER — ACETAMINOPHEN 325 MG PO TABS
650.0000 mg | ORAL_TABLET | Freq: Four times a day (QID) | ORAL | Status: DC | PRN
  Administered 2024-02-07: 650 mg via ORAL
  Filled 2024-02-06: qty 2

## 2024-02-06 MED ORDER — MORPHINE SULFATE (PF) 2 MG/ML IV SOLN
2.0000 mg | Freq: Once | INTRAVENOUS | Status: AC
  Administered 2024-02-06: 2 mg via INTRAVENOUS
  Filled 2024-02-06: qty 1

## 2024-02-06 MED ORDER — SUMATRIPTAN SUCCINATE 50 MG PO TABS
100.0000 mg | ORAL_TABLET | ORAL | Status: DC | PRN
Start: 2024-02-06 — End: 2024-02-07

## 2024-02-06 MED ORDER — ONDANSETRON HCL 4 MG/2ML IJ SOLN: 4.0000 mg | Freq: Four times a day (QID) | INTRAMUSCULAR | Status: DC | PRN

## 2024-02-06 MED ORDER — RIZATRIPTAN BENZOATE 10 MG PO TBDP: 10.0000 mg | ORAL_TABLET | ORAL | Status: DC | PRN

## 2024-02-06 MED ORDER — ENSURE ENLIVE PO LIQD: 237.0000 mL | Freq: Two times a day (BID) | ORAL | Status: DC

## 2024-02-06 MED ORDER — ONDANSETRON HCL 4 MG PO TABS: 4.0000 mg | ORAL_TABLET | Freq: Four times a day (QID) | ORAL | Status: DC | PRN

## 2024-02-06 MED ORDER — ACETAMINOPHEN 650 MG RE SUPP: 650.0000 mg | Freq: Four times a day (QID) | RECTAL | Status: DC | PRN

## 2024-02-06 MED ORDER — POLYETHYLENE GLYCOL 3350 17 G PO PACK: 17.0000 g | PACK | Freq: Every day | ORAL | Status: DC | PRN

## 2024-02-06 NOTE — H&P (Addendum)
 History and Physical    [name]:[id number] DOB: Jun 26, 1951 DOA: 02/06/2024  PCP: Meldon Sport, MD   Patient coming from: Home  I have personally briefly reviewed patient's old medical records in [facility]  Chief Complaint: Syncope  HPI: [name] is a 73 y.o. female with medical history significant for DVT and PE, hypertension, hypothyroidism, asthma. Patient presented to the ED with reports of loss of consciousness today.  She woke up this morning at about 9 AM, went to the kitchen to get her medications and get water .  She was back in the bedroom and next she notes she has fallen, and hit her head.  She does not remember if or how she fell, does not remember calling her neighbour and friend and does not remember getting to the ED here.  She is unaware of any chest pain or palpitations or difficulty breathing.  She is on Eliquis  and compliant.  ED Course: Temperature 98.4.  Heart rate 70s.  Respirate rate 17-24.  Blood pressure systolic 120s to 811B.  O2 sats > 95% on room air.  Head CT-no acute abnormality, shows remote right medial orbital wall fracture.  Subsequent MRI without acute abnormality, chronic stable findings - shows remote infarcts, remote right orbital blowout fracture. Hospitalist to admit for syncope.  Review of Systems: As per HPI all other systems reviewed and negative.  Past Medical History:  Diagnosis Date   Anal fissure    Anxiety    Arthritis    Asthma    C. difficile diarrhea 09/17/2021   Cataract    Phreesia 01/05/2021   Chest pain    a. 02-25-00 Cath: nl cors, EF 60%;  b. 10/2010 MV: nl LV, no ischemia/infarct;  c. 03/2014 Admit c/p, r/o->grief from husbands death.   Chronic RUQ pain 02-25-2008   EUS slightly dilated CBD (7.23mm), otherwise nl   Depression    Diverticulosis    Eczema    Exposure to chemical inhalation mid 1970s   Fatty liver    G6PD deficiency    Gallstones    GERD (gastroesophageal reflux disease)    Glaucoma     History of pulmonary embolus (PE)    Treated with Eliquis  02/25/15   HTN (hypertension)    Hypercholesteremia    a. intolerant to statins.   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 She has limited vision in the right eye.   Migraines    inner ocular   Ocular migraine    Palpitations    Pleurisy    Pneumonia 07/16/2021   Patient was exposed to a harsh chemical in the 1970's that created scar tissue in her lungs. She has had pneumonia several times in the past.   PONV (postoperative nausea and vomiting)    Pulmonary emboli (HCC)    Recurrent upper respiratory infection (URI)    Renal cyst    Retinal cyst    Sickle cell trait (HCC)    Tubular adenoma of colon 09/17/2011   Urticaria     Past Surgical History:  Procedure Laterality Date   ABDOMINAL HYSTERECTOMY     ADENOIDECTOMY     ANGIOPLASTY     APPENDECTOMY     BIOPSY N/A 03/14/2013   Procedure: SMALL BOWEL AND GASTRIC BIOPSIES (Procedure #1);  Surgeon: Alyce Jubilee, MD;  Location: [facility];  Service: Endoscopy;  Laterality: N/A;   CARDIAC CATHETERIZATION Left 02/11/2000   CATARACT EXTRACTION Left    CATARACT EXTRACTION W/PHACO Right 03/19/2021   Procedure: CATARACT EXTRACTION PHACO AND INTRAOCULAR LENS PLACEMENT (IOC) RIGHT 6.75 00:50.4;  Surgeon: Annell Kidney, MD;  Location: [facility];  Service: Ophthalmology;  Laterality: Right;   CHOLECYSTECTOMY  12/2007   COLONOSCOPY  12/22/2010   XBJ:YNWGNF, cecal adenomatous polyp   COLONOSCOPY WITH PROPOFOL  N/A 03/31/2016   Dr. Nolene Baumgarten: four sessile polyps rectum/sigmoid colon, diverticulosi, ext/int hemorrhoids. hyperplastic polyps, next tcs 5 years.   COLONOSCOPY WITH PROPOFOL  N/A 04/21/2021   anal fissure, non-bleeding internal hemorrhoids, right colon diverticulosis, one 2 mm polyp in ascending, three 4-6 mm polyps in descending colon. Tubular adenomas. 5 year surveillance.    complete hysterectomy     ENTEROSCOPY N/A 03/14/2013   AOZ:HYQM gastritis/ulcers has healed   ESOPHAGOGASTRODUODENOSCOPY  09/22/2011   VHQ:IONG gastritis/Duodenitis   EXCISIONAL HEMORRHOIDECTOMY     FLEXIBLE SIGMOIDOSCOPY N/A 03/14/2013   SLF:3 colon polyp removed/moderate sized internal hemorrhoids   FLEXIBLE SIGMOIDOSCOPY N/A 06/09/2016   Procedure: FLEXIBLE SIGMOIDOSCOPY;  Surgeon: Alyce Jubilee, MD;  Location: [facility];  Service: Endoscopy;  Laterality: N/A;  rectal polyps times 2   FLEXIBLE SIGMOIDOSCOPY N/A 03/18/2018   Procedure: FLEXIBLE SIGMOIDOSCOPY;  Surgeon: Alyce Jubilee, MD;  Location: [facility];  Service: Endoscopy;  Laterality: N/A;  9:15am   FOOT SURGERY     bunion removal left   GIVENS CAPSULE STUDY N/A 02/10/2013   Procedure: GIVENS CAPSULE STUDY;  Surgeon: Alyce Jubilee, MD;  Location: [facility];  Service: Endoscopy;  Laterality: N/A;  730   HEEL SPUR EXCISION     right    HEMORRHOID BANDING N/A 03/14/2013   Procedure: HEMORRHOID BANDING (Procedure #3)  3 bands applied EXB#[id number] Exp 02/02/2014 ;  Surgeon: Alyce Jubilee, MD;  Location: [facility];  Service: Endoscopy;  Laterality: N/A;   HEMORRHOID SURGERY N/A 04/24/2016   Procedure: EXTENSIVE HEMORRHOIDECTOMY;  Surgeon: Franki Isles, MD;  Location: [facility];  Service: General;  Laterality: N/A;   LAPAROSCOPY     adhesions   POLYPECTOMY N/A 03/14/2013   WNU:UVOZ Gastritis . ULCERS SEEN ON MAY 6 HAVE HEALED   POLYPECTOMY  03/31/2016   Procedure: POLYPECTOMY;  Surgeon: Alyce Jubilee, MD;  Location: [facility];  Service: Endoscopy;;  sigmoid colon polyps x2, rectal polyps x2   POLYPECTOMY  04/21/2021   Procedure: POLYPECTOMY;  Surgeon: Vinetta Greening, DO;  Location: [facility];  Service: Endoscopy;;   RECTAL EXAM UNDER ANESTHESIA N/A 07/23/2021   Procedure: RECTAL EXAM UNDER ANESTHESIA;  Surgeon: Joyce Nixon, MD;  Location: [facility];  Service: General;  Laterality: N/A;    SPHINCTEROTOMY N/A 07/23/2021   Procedure: CHEMICAL SPHINCTEROTOMY;  Surgeon: Joyce Nixon, MD;  Location: [facility];  Service: General;  Laterality: N/A;   TONSILLECTOMY     TUBAL LIGATION       reports that she quit smoking about 27 years ago. Her smoking use included cigarettes. She started smoking about 57 years ago. She has a 15 pack-year smoking history. She has never used smokeless tobacco. She reports that she does not currently use alcohol after a past usage of about 4.0 standard drinks of alcohol per week. She reports that she does not use drugs.  Allergies  Allergen Reactions   Nutmeg Oil (Myristica Oil) Anaphylaxis    Nut oil- skin irritation   Adhesive [Tape] Other (See Comments)  Blisters skin   Aspirin Other (See Comments)    Sickle cell trait--  Not recommended unless emergency   Imitrex [Sumatriptan] Hives   Levaquin  [Levofloxacin  In D5w] Other (See Comments)    Altered mental status Affects G6PD   Statins Other (See Comments)    Myopathy - elevated CK   Keflex  [Cephalexin ] Other (See Comments)    G6PD   Latex Rash   Sulfa Antibiotics Other (See Comments)    Patient has sickle cell trait    Family History  Problem Relation Age of Onset   Colon cancer Mother        27s   Urticaria Mother    Heart disease Mother    Colon polyps Mother    Cancer Father        oral   Crohn's disease Sister    Cancer Sister        around heart   Colon polyps Sister    Diabetes Brother    Other Brother        intestinal sugery   Colon polyps Brother    Colon cancer Maternal Grandmother    Colon cancer Maternal Grandfather    Colon cancer Paternal Grandfather    Anesthesia problems Neg Hx    Prior to Admission medications   Medication Sig Start Date End Date Taking? Authorizing Provider  ALPRAZolam  (XANAX ) 1 MG tablet Take 1 tablet (1 mg total) by mouth 2 (two) times daily as needed for anxiety. 11/17/23  Yes Meldon Sport, MD  amoxicillin   (AMOXIL ) 500 MG capsule Take 4 capsules at once by mouth 1 hour prior to the dental procedure. 02/02/24  Yes Meldon Sport, MD  apixaban  (ELIQUIS ) 5 MG TABS tablet Take 1 tablet (5 mg total) by mouth 2 (two) times daily. 01/27/24  Yes Strader, [place] M, PA-C  brimonidine  (ALPHAGAN ) 0.2 % ophthalmic solution Place 1 drop into both eyes 2 (two) times daily. Patient taking differently: Place 1 drop into both eyes at bedtime. 02/02/24  Yes Meldon Sport, MD  Cholecalciferol (VITAMIN D3) 50 MCG (2000 UT) TABS Take 2,000 Units by mouth daily.   Yes [provider]  dexlansoprazole  (DEXILANT ) 60 MG capsule Take 1 capsule (60 mg total) by mouth daily. 08/10/23  Yes Daina Drum, MD  fenofibrate  (TRICOR ) 145 MG tablet Take 1 tablet (145 mg total) by mouth daily. 04/21/23  Yes Meldon Sport, MD  irbesartan  (AVAPRO ) 150 MG tablet Take 0.5 tablets (75 mg total) by mouth daily. 12/23/23  Yes Meldon Sport, MD  latanoprost  (XALATAN ) 0.005 % ophthalmic solution Place 1 drop into both eyes at bedtime. 02/02/24  Yes Meldon Sport, MD  Magnesium  500 MG TABS Take 1 tablet by mouth in the morning and at bedtime.   Yes [provider]  nitroGLYCERIN  (NITROLINGUAL ) 0.4 MG/SPRAY spray Place 1 spray under the tongue every 5 (five) minutes as needed for chest pain. 10/04/23  Yes Meldon Sport, MD  ondansetron  (ZOFRAN ) 4 MG tablet TAKE ONE TABLET BY MOUTH EVERY 8 HOURS AS NEEDED FOR NAUSEA OR VOMITING 09/23/23  Yes Patel, Rutwik K, MD  Probiotic Product (PROBIOTIC DAILY PO) Take 1 capsule by mouth daily. VSL #3   Yes [provider]  ranolazine  (RANEXA ) 500 MG 12 hr tablet Take 1 tablet (500 mg total) by mouth 2 (two) times daily. 01/27/24  Yes Strader, [place] M, PA-C  rizatriptan  (MAXALT -MLT) 10 MG disintegrating tablet DISSOLVE ONE TABLET ON TONGUE AS NEEDED FOR MIGRAINE. MAY REPEAT  IN 2 HOURS IF NEEDED Patient taking differently: Take 10 mg by mouth as needed for migraine. 11/08/23  Yes  Camara, Nancye Azure, MD  XIIDRA  5 % SOLN Apply 1 drop to eye at bedtime. 02/04/24  Yes [provider]  zolpidem  (AMBIEN ) 10 MG tablet Take 1 tablet (10 mg total) by mouth at bedtime as needed for sleep. 12/23/23  Yes Meldon Sport, MD  HYDROcodone -acetaminophen  (NORCO/VICODIN) 5-325 MG tablet Take 1 tablet by mouth every 4 (four) hours as needed for up to 5 days for moderate pain (pain score 4-6). 02/02/24 02/07/24  Pleasant Brilliant, MD    Physical Exam: Vitals:   02/06/24 1531 02/06/24 1702 02/06/24 1817 02/06/24 1841  BP: 139/80 133/78 (!) 146/95 (!) 156/80  Pulse: 78 79 78 75  Resp: 17 20 18  (!) 21  Temp: 98 F (36.7 C)   98 F (36.7 C)  TempSrc: Oral   Oral  SpO2: 95% 96% 96% 99%  Weight:    92.8 kg  Height:    5\' 7"  (1.702 m)    Constitutional: NAD, calm, comfortable Vitals:   02/06/24 1531 02/06/24 1702 02/06/24 1817 02/06/24 1841  BP: 139/80 133/78 (!) 146/95 (!) 156/80  Pulse: 78 79 78 75  Resp: 17 20 18  (!) 21  Temp: 98 F (36.7 C)   98 F (36.7 C)  TempSrc: Oral   Oral  SpO2: 95% 96% 96% 99%  Weight:    92.8 kg  Height:    5\' 7"  (1.702 m)   Eyes: PERRL, lids and conjunctivae normal ENMT: Mucous membranes are moist.   Neck: normal, supple, no masses, no thyromegaly Respiratory: clear to auscultation bilaterally, no wheezing, no crackles. Normal respiratory effort. No accessory muscle use.  Cardiovascular: Regular rate and rhythm, no murmurs / rubs / gallops. No extremity edema.  Extremities warm Abdomen: no tenderness, no masses palpated. No hepatosplenomegaly. Bowel sounds positive.  Musculoskeletal: no clubbing / cyanosis. No joint deformity upper and lower extremities.  Skin: no rashes, lesions, ulcers. No induration Neurologic: No facial asymmetry, moving extremity spontaneously, speech fluent Psychiatric: Normal judgment and insight. Alert and oriented x 3. Normal mood.   Labs on Admission: I have personally reviewed following labs and imaging  studies  CBC: Recent Labs  Lab 02/06/24 1232  WBC 5.0  NEUTROABS 2.9  HGB 11.9*  HCT 35.0*  MCV 98.3  PLT 198   Basic Metabolic Panel: Recent Labs  Lab 02/06/24 1232  NA 141  K 3.5  CL 107  CO2 25  GLUCOSE 127*  BUN 13  CREATININE 1.07*  CALCIUM 8.8*   GFR: Estimated Creatinine Clearance: 56.4 mL/min (A) (by C-G formula based on SCr of 1.07 mg/dL (H)). Liver Function Tests: Recent Labs  Lab 02/06/24 1232  AST 20  ALT 15  ALKPHOS 44  BILITOT 0.7  PROT 7.0  ALBUMIN 3.9   Coagulation Profile: Recent Labs  Lab 02/06/24 1232  INR 1.4*   Urine analysis:    Component Value Date/Time   COLORURINE YELLOW 02/06/2024 1540   APPEARANCEUR CLEAR 02/06/2024 1540   LABSPEC 1.010 02/06/2024 1540   PHURINE 6.0 02/06/2024 1540   GLUCOSEU NEGATIVE 02/06/2024 1540   HGBUR NEGATIVE 02/06/2024 1540   BILIRUBINUR NEGATIVE 02/06/2024 1540   BILIRUBINUR negative 10/29/2022 1116   KETONESUR NEGATIVE 02/06/2024 1540   PROTEINUR NEGATIVE 02/06/2024 1540   UROBILINOGEN 0.2 10/29/2022 1116   UROBILINOGEN 0.2 07/28/2015 0717   NITRITE NEGATIVE 02/06/2024 1540   LEUKOCYTESUR SMALL (A) 02/06/2024 1540  Radiological Exams on Admission: MR BRAIN WO CONTRAST Result Date: 02/06/2024 CLINICAL DATA:  Mental status changes. Fall with loss of consciousness. The patient is an a stick to the event. EXAM: MRI HEAD WITHOUT CONTRAST TECHNIQUE: Multiplanar, multiecho pulse sequences of the brain and surrounding structures were obtained without intravenous contrast. COMPARISON:  CT head without contrast 02/06/2024. FINDINGS: Brain: No acute infarct, hemorrhage, or mass lesion is present. A remote lacunar infarct is again noted in the posteroinferior right cerebellum. No significant white matter lesions are present. Deep brain nuclei are within normal limits. The ventricles are of normal size. No significant extraaxial fluid collection is present. Brainstem and cerebellum are otherwise within normal  limits. The internal auditory canals are within normal limits. Midline structures are within normal limits. Vascular: Flow is present in the major intracranial arteries. Skull and upper cervical spine: Degenerative changes are present the upper cervical spine. Craniocervical junction is normal. Marrow signal is normal. Sinuses/Orbits: The paranasal sinuses and mastoid air cells are clear. A remote right orbital blowout fracture is noted. Elongation of the left globe is again noted. IMPRESSION: 1. No acute intracranial abnormality or significant interval change. 2. 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 3. Remote right orbital blowout fracture. 4. Elongation of the left globe is again noted. Electronically Signed   By: Audree Leas M.D.   On: 02/06/2024 15:59   DG Elbow Complete Right Result Date: 02/06/2024 CLINICAL DATA:  Fall EXAM: RIGHT ELBOW - COMPLETE 3+ VIEW COMPARISON:  None Available. FINDINGS: There is no evidence of fracture, dislocation, or joint effusion. There is no evidence of arthropathy or other focal bone abnormality. Soft tissues are unremarkable. IMPRESSION: Negative. Electronically Signed   By: Tyron Gallon M.D.   On: 02/06/2024 15:14   DG Shoulder Right Result Date: 02/06/2024 CLINICAL DATA:  Fall and trauma to the right upper extremity. EXAM: RIGHT SHOULDER - 2+ VIEW COMPARISON:  None Available. FINDINGS: No acute fracture or dislocation. The bones are osteopenic. There is degenerative changes of the right AC joint and spurring. The soft tissues are unremarkable. IMPRESSION: 1. No acute fracture or dislocation. 2. Osteopenia and degenerative changes. Electronically Signed   By: Angus Bark M.D.   On: 02/06/2024 15:13   CT Head Wo Contrast Result Date: 02/06/2024 CLINICAL DATA:  Head trauma, minor (Age >= 65y) Mental status change, unknown cause EXAM: CT HEAD WITHOUT CONTRAST TECHNIQUE: Contiguous axial images were obtained from the base of the skull  through the vertex without intravenous contrast. RADIATION DOSE REDUCTION: This exam was performed according to the departmental dose-optimization program which includes automated exposure control, adjustment of the mA and/or kV according to patient size and/or use of iterative reconstruction technique. COMPARISON:  None Available. FINDINGS: Brain: Normal anatomic configuration. Parenchymal volume loss is commensurate with the patient's age. No abnormal intra or extra-axial mass lesion or fluid collection. No abnormal mass effect or midline shift. No evidence of acute intracranial hemorrhage or infarct. Ventricular size is normal. Cerebellum unremarkable. Vascular: No asymmetric hyperdense vasculature at the skull base. Skull: Intact Sinuses/Orbits: Paranasal sinuses are clear. Remote right medial orbital wall fracture. Orbits are otherwise unremarkable. Other: Mastoid air cells and middle ear cavities are clear. IMPRESSION: 1. No acute intracranial abnormality. 2. Remote right medial orbital wall fracture. Electronically Signed   By: Worthy Heads M.D.   On: 02/06/2024 12:46   DG Chest Portable 1 View Result Date: 02/06/2024 CLINICAL DATA:  syncope EXAM: PORTABLE CHEST - 1 VIEW COMPARISON:  12/18/2023 FINDINGS:  Lungs are clear. Heart size and mediastinal contours are within normal limits. Aortic Atherosclerosis (ICD10-170.0). No effusion. Visualized bones unremarkable. IMPRESSION: No acute cardiopulmonary disease. Electronically Signed   By: Nicoletta Barrier M.D.   On: 02/06/2024 12:19   EKG: Independently reviewed.  Sinus rhythm, rate 75, QTc 455.  No significant change from prior.  Assessment/Plan Principal Problem:   Syncope Active Problems:   Depression   Essential hypertension, benign   Bilateral pulmonary embolism (HCC)   Assessment and Plan: * Syncope Syncopal episode versus amnesia.  Patient does not remember details from her fall today till how she got to the ED.  She reports similar episode  when she was admitted 10/25/2023 for PE and DVT.   On anticoagulation and compliance.  She denies cardiorespiratory symptoms.  Memory seems to be back to baseline.  CT and MRI brain without acute findings, shows chronic remote infarcts.  EKG without acute abnormality.  Troponin 4 x 2.  Differentials include cardiac etiology versus transient global amnesia. -Negative orthostatic vitals - Recent echo 01/05/2024 EF-65 to 70%, G1DD. - Monitor on telemetry  Bilateral pulmonary embolism (HCC) Hx of PE and DVT.  Reports compliance with Eliquis . -She tells me she has taken her home medications for today, she also does not want to take hospital medications, reports during prior short hospitalization, she had a large bill for medications she got in the hospital including inhalers she did not use.  I have explained the reason why hospital medications are used, she still does not want hospital meds.  Essential hypertension, benign Blood pressure stable.  On irbesartan , declines hospital meds.  She has outpatient follow-up with Dr. Cheree Cords- 5/12, and has lupus antigen, B2 glycoprotein antibodies, cardiolipin antibodies and D-dimer ordered, that were to be done tomorrow. She request to have these done prior to discharge.   -  I have ordered for tomorrow morning.   DVT prophylaxis: Eliquis  Code Status: Full code Family Communication: Friend/neighbour at bedside Disposition Plan: ~ 2 days Consults called: None Admission status:  Obs Tele    Author: Pati Bonine, MD 02/06/2024 8:05 PM  For on call review [email or web address].

## 2024-02-06 NOTE — Assessment & Plan Note (Signed)
 Hx of PE and DVT.  Reports compliance with Eliquis . -She tells me she has taken her home medications for today, she also does not want to take hospital medications, reports during prior short hospitalization, she had a large bill for medications she got in the hospital including inhalers she did not use.  I have explained the reason why hospital medications are used, she still does not want hospital meds.

## 2024-02-06 NOTE — Assessment & Plan Note (Signed)
 Blood pressure stable.  On irbesartan , declines hospital meds.

## 2024-02-06 NOTE — ED Provider Notes (Signed)
 [place] EMERGENCY DEPARTMENT AT [facility] Provider Note   CSN: [id number] Arrival date & time: 02/06/24  1047     History  Chief Complaint  Patient presents with   [name] is a 73 y.o. female.   Fall Pertinent negatives include no chest pain, no abdominal pain, no headaches and no shortness of breath.       [name] is a 73 y.o. 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  States that she woke this morning around 9 AM felt to be at her baseline, took her apixaban  and went back to bed.  She woke approximately 30 minutes later felt dizzy attempted to ambulate to the kitchen and fell.  Patient does not recall any details of the event.  Believes that she fell to her right side.  She complains of pain of her right shoulder right elbow and right side of her head.  She was able to get up without assistance.  She called her friend who states that she seems to be confused, patient states she does not remember calling her.  Currently she complains of pain to her right head, elbow and right shoulder.  Denies visual changes from baseline and headache.    States she had a similar episode few months ago, but did not seek medical attention.     Home Medications Prior to Admission medications   Medication Sig Start Date End Date Taking? Authorizing Provider  albuterol  (PROVENTIL ) (2.5 MG/3ML) 0.083% nebulizer solution Take 3 mLs (2.5 mg total) by nebulization every 4 (four) hours as needed for wheezing or shortness of breath. 03/29/23   Meldon Sport, MD  ALPRAZolam  (XANAX ) 1 MG tablet Take 1 tablet (1 mg total) by mouth 2 (two) times daily as needed for anxiety. 11/17/23   Meldon Sport, MD  amoxicillin  (AMOXIL ) 500 MG capsule Take 4 capsules at once by  mouth 1 hour prior to the dental procedure. 02/02/24   Meldon Sport, MD  apixaban  (ELIQUIS ) 5 MG TABS tablet Take 1 tablet (5 mg total) by mouth 2 (two) times daily. 01/27/24   Strader, Dimple Francis, PA-C  brimonidine  (ALPHAGAN ) 0.2 % ophthalmic solution Place 1 drop into both eyes 2 (two) times daily. 02/02/24   Meldon Sport, MD  chlorpheniramine-HYDROcodone  (TUSSIONEX) 10-8 MG/5ML Take 5 mLs by mouth every 12 (twelve) hours as needed for cough. 10/04/23   Meldon Sport, MD  Cholecalciferol (VITAMIN D3) 50 MCG (2000 UT) TABS Take 2,000 Units by mouth daily.    [provider]  dexlansoprazole  (DEXILANT ) 60 MG capsule Take 1 capsule (60 mg total) by mouth daily. 08/10/23   Daina Drum, MD  fenofibrate  (TRICOR ) 145 MG tablet Take 1 tablet (145 mg total) by mouth daily. 04/21/23   Meldon Sport, MD  Fluticasone -Umeclidin-Vilant (TRELEGY ELLIPTA ) 100-62.5-25 MCG/ACT AEPB Inhale 1 puff into the lungs daily. 10/04/23   Meldon Sport, MD  HYDROcodone -acetaminophen  (NORCO/VICODIN) 5-325 MG tablet Take 1 tablet by mouth every 4 (four) hours as needed for up to 5 days for moderate pain (pain score 4-6). 02/02/24 02/07/24  Pleasant Brilliant, MD  irbesartan  (AVAPRO ) 150 MG tablet Take 0.5 tablets (75 mg total) by mouth daily. 12/23/23   Meldon Sport, MD  latanoprost  (XALATAN ) 0.005 % ophthalmic solution Place 1  drop into both eyes at bedtime. 02/02/24   Meldon Sport, MD  Magnesium  500 MG TABS Take 1 tablet by mouth 3 (three) times daily with meals.    [provider]  meclizine  (ANTIVERT ) 12.5 MG tablet Take 1 tablet (12.5 mg total) by mouth 3 (three) times daily as needed for dizziness. 10/04/23   Meldon Sport, MD  nitroGLYCERIN  (NITROLINGUAL ) 0.4 MG/SPRAY spray Place 1 spray under the tongue every 5 (five) minutes as needed for chest pain. 10/04/23   Meldon Sport, MD  NON FORMULARY Take 1 tablet by mouth daily as needed (Stomach Bloat). FD GUARD    [provider]   Nutritional Supplements (IBS SUPPORT PO) Take 1 tablet by mouth daily as needed (stomach aches). FD Guard and IB Guard Peppermint/Fennel seeds    [provider]  ondansetron  (ZOFRAN ) 4 MG tablet TAKE ONE TABLET BY MOUTH EVERY 8 HOURS AS NEEDED FOR NAUSEA OR VOMITING 09/23/23   Meldon Sport, MD  oxyCODONE -acetaminophen  (PERCOCET/ROXICET) 5-325 MG tablet Take 1 tablet by mouth every 6 (six) hours as needed for severe pain (pain score 7-10). 12/18/23   Zammit, Joseph, MD  Probiotic Product (PROBIOTIC DAILY PO) Take 1 capsule by mouth daily. VSL #3    [provider]  ranolazine  (RANEXA ) 500 MG 12 hr tablet Take 1 tablet (500 mg total) by mouth 2 (two) times daily. 01/27/24   Strader, Dimple Francis, PA-C  rizatriptan  (MAXALT -MLT) 10 MG disintegrating tablet DISSOLVE ONE TABLET ON TONGUE AS NEEDED FOR MIGRAINE. MAY REPEAT IN 2 HOURS IF NEEDED 11/08/23   Cassandra Cleveland, MD  zolpidem  (AMBIEN ) 10 MG tablet Take 1 tablet (10 mg total) by mouth at bedtime as needed for sleep. 12/23/23   Meldon Sport, MD      Allergies    Nutmeg oil (myristica oil), Adhesive [tape], Aspirin, Imitrex [sumatriptan], Keflex  [cephalexin ], Levaquin  [levofloxacin  in d5w], Other, Statins, Sulfa antibiotics, Sulfonamide derivatives, and Latex    Review of Systems   Review of Systems  Constitutional:  Negative for chills and fever.  Eyes:  Negative for visual disturbance.  Respiratory:  Negative for cough and shortness of breath.   Cardiovascular:  Negative for chest pain.  Gastrointestinal:  Negative for abdominal pain, diarrhea, nausea and vomiting.  Musculoskeletal:  Positive for arthralgias (right elbow and shoulder pain). Negative for back pain and neck pain.  Skin:  Negative for color change and wound.  Neurological:  Positive for dizziness and weakness. Negative for seizures, facial asymmetry, speech difficulty and headaches.  Psychiatric/Behavioral:  Positive for confusion.     Physical Exam Updated  Vital Signs BP 124/78   Pulse 74   Temp 98.4 F (36.9 C)   Resp 17   Ht 5\' 7"  (1.702 m)   Wt 92.1 kg   SpO2 97%   BMI 31.79 kg/m  Physical Exam Vitals and nursing note reviewed.  Constitutional:      General: She is not in acute distress.    Appearance: Normal appearance. She is not toxic-appearing.  HENT:     Head:     Comments: Ttp right parietal scalp.  No abrasions lacerations or palpable hematomas.    Mouth/Throat:     Mouth: Mucous membranes are moist.     Pharynx: Oropharynx is clear.  Eyes:     Extraocular Movements: Extraocular movements intact.     Conjunctiva/sclera: Conjunctivae normal.     Pupils: Pupils are equal, round, and reactive to light.  Neck:  Trachea: Phonation normal.  Cardiovascular:     Rate and Rhythm: Normal rate and regular rhythm.     Pulses: Normal pulses.  Pulmonary:     Effort: Pulmonary effort is normal.     Breath sounds: Normal breath sounds.  Abdominal:     Palpations: Abdomen is soft.     Tenderness: There is no abdominal tenderness.  Musculoskeletal:     Cervical back: Normal range of motion. No tenderness. No spinous process tenderness.     Right lower leg: No edema.     Left lower leg: No edema.  Skin:    General: Skin is warm.     Capillary Refill: Capillary refill takes less than 2 seconds.  Neurological:     Mental Status: She is alert and oriented to person, place, and time.     GCS: GCS eye subscore is 4. GCS verbal subscore is 5. GCS motor subscore is 6.     Cranial Nerves: No facial asymmetry.     Sensory: Sensation is intact. No sensory deficit.     Motor: Weakness present. No abnormal muscle tone or pronator drift.     Coordination: Finger-Nose-Finger Test abnormal. Heel to Shin Test normal.     Comments: Mild, 4/5 right sided weakness with grips on exam, abnormal finger-nose testing.  I do not appreciate a facial droop.  Mentating well.  No pronator drift     ED Results / Procedures / Treatments   Labs (all  labs ordered are listed, but only abnormal results are displayed) Labs Reviewed  CBC WITH DIFFERENTIAL/PLATELET - Abnormal; Notable for the following components:      Result Value   RBC 3.56 (*)    Hemoglobin 11.9 (*)    HCT 35.0 (*)    All other components within normal limits  COMPREHENSIVE METABOLIC PANEL WITH GFR - Abnormal; Notable for the following components:   Glucose, Bld 127 (*)    Creatinine, Ser 1.07 (*)    Calcium 8.8 (*)    GFR, Estimated 56 (*)    All other components within normal limits  PROTIME-INR - Abnormal; Notable for the following components:   Prothrombin Time 17.5 (*)    INR 1.4 (*)    All other components within normal limits  BRAIN NATRIURETIC PEPTIDE  URINALYSIS, ROUTINE W REFLEX MICROSCOPIC  CBG MONITORING, ED  TROPONIN I (HIGH SENSITIVITY)  TROPONIN I (HIGH SENSITIVITY)    EKG None  Radiology MR BRAIN WO CONTRAST Result Date: 02/06/2024 CLINICAL DATA:  Mental status changes. Fall with loss of consciousness. The patient is an a stick to the event. EXAM: MRI HEAD WITHOUT CONTRAST TECHNIQUE: Multiplanar, multiecho pulse sequences of the brain and surrounding structures were obtained without intravenous contrast. COMPARISON:  CT head without contrast 02/06/2024. FINDINGS: Brain: No acute infarct, hemorrhage, or mass lesion is present. A remote lacunar infarct is again noted in the posteroinferior right cerebellum. No significant white matter lesions are present. Deep brain nuclei are within normal limits. The ventricles are of normal size. No significant extraaxial fluid collection is present. Brainstem and cerebellum are otherwise within normal limits. The internal auditory canals are within normal limits. Midline structures are within normal limits. Vascular: Flow is present in the major intracranial arteries. Skull and upper cervical spine: Degenerative changes are present the upper cervical spine. Craniocervical junction is normal. Marrow signal is normal.  Sinuses/Orbits: The paranasal sinuses and mastoid air cells are clear. A remote right orbital blowout fracture is noted. Elongation of the left globe  is again noted. IMPRESSION: 1. No acute intracranial abnormality or significant interval change. 2. 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 3. Remote right orbital blowout fracture. 4. Elongation of the left globe is again noted. Electronically Signed   By: Audree Leas M.D.   On: 02/06/2024 15:59   DG Elbow Complete Right Result Date: 02/06/2024 CLINICAL DATA:  Fall EXAM: RIGHT ELBOW - COMPLETE 3+ VIEW COMPARISON:  None Available. FINDINGS: There is no evidence of fracture, dislocation, or joint effusion. There is no evidence of arthropathy or other focal bone abnormality. Soft tissues are unremarkable. IMPRESSION: Negative. Electronically Signed   By: Tyron Gallon M.D.   On: 02/06/2024 15:14   DG Shoulder Right Result Date: 02/06/2024 CLINICAL DATA:  Fall and trauma to the right upper extremity. EXAM: RIGHT SHOULDER - 2+ VIEW COMPARISON:  None Available. FINDINGS: No acute fracture or dislocation. The bones are osteopenic. There is degenerative changes of the right AC joint and spurring. The soft tissues are unremarkable. IMPRESSION: 1. No acute fracture or dislocation. 2. Osteopenia and degenerative changes. Electronically Signed   By: Angus Bark M.D.   On: 02/06/2024 15:13   CT Head Wo Contrast Result Date: 02/06/2024 CLINICAL DATA:  Head trauma, minor (Age >= 65y) Mental status change, unknown cause EXAM: CT HEAD WITHOUT CONTRAST TECHNIQUE: Contiguous axial images were obtained from the base of the skull through the vertex without intravenous contrast. RADIATION DOSE REDUCTION: This exam was performed according to the departmental dose-optimization program which includes automated exposure control, adjustment of the mA and/or kV according to patient size and/or use of iterative reconstruction technique. COMPARISON:   None Available. FINDINGS: Brain: Normal anatomic configuration. Parenchymal volume loss is commensurate with the patient's age. No abnormal intra or extra-axial mass lesion or fluid collection. No abnormal mass effect or midline shift. No evidence of acute intracranial hemorrhage or infarct. Ventricular size is normal. Cerebellum unremarkable. Vascular: No asymmetric hyperdense vasculature at the skull base. Skull: Intact Sinuses/Orbits: Paranasal sinuses are clear. Remote right medial orbital wall fracture. Orbits are otherwise unremarkable. Other: Mastoid air cells and middle ear cavities are clear. IMPRESSION: 1. No acute intracranial abnormality. 2. Remote right medial orbital wall fracture. Electronically Signed   By: Worthy Heads M.D.   On: 02/06/2024 12:46   DG Chest Portable 1 View Result Date: 02/06/2024 CLINICAL DATA:  syncope EXAM: PORTABLE CHEST - 1 VIEW COMPARISON:  12/18/2023 FINDINGS: Lungs are clear. Heart size and mediastinal contours are within normal limits. Aortic Atherosclerosis (ICD10-170.0). No effusion. Visualized bones unremarkable. IMPRESSION: No acute cardiopulmonary disease. Electronically Signed   By: Nicoletta Barrier M.D.   On: 02/06/2024 12:19    Procedures Procedures    Medications Ordered in ED Medications - No data to display  ED Course/ Medical Decision Making/ A&P                                 Medical Decision Making Patient here from home for evaluation of dizziness and fall.  Patient does not recall details about event.  Patient was accompanied by a friend who states that she called her after the fall and seemed confused patient did not recall calling her friend.  Chronically anticoagulated.  Has tenderness of her right scalp right elbow and right shoulder on exam.  She is moving all extremities without difficulty.  On exam, she does have some mild right sided weakness with grip strength.  I do  not appreciate any facial weakness or pronator drift.  She has  abnormal finger-nose testing on the right  Concerned about possible stroke versus intracranial injury.  Patient endorses last known well at 9 AM this morning.  TIA, electrolyte abnml ,syncope from cardiac source also considered.   Amount and/or Complexity of Data Reviewed Labs: ordered.    Details: Labs no evidence of leukocytosis, chemistries without significant derangement.  BNP reassuring.  Initial troponin also reassuring, delta unchanged Radiology: ordered.    Details: CT head without acute intracranial abnormality.  Chest x-ray without acute finding  MRI brain without acute intracranial injury ECG/medicine tests: ordered.    Details: Sinus rhythm with abnormal inferior Q waves Discussion of management or test interpretation with external provider(s): On recheck, patient resting comfortably.  Vital signs reassuring.  Still has mild right-sided weakness with difficulty recalling events.  Cause of patient's syncope unclear Patient also seen by Dr. Lewis Red and care plan discussed.  Patient will likely need hospital admission for further workup of her syncope  Discussed findings with [facility] hospitalist Dr. Debroah Fanning who is agreeable to admit  Risk Decision regarding hospitalization.   Orthostatic VS for the past 24 hrs (Last 3 readings):  BP- Lying Pulse- Lying BP- Sitting Pulse- Sitting BP- Standing at 0 minutes Pulse- Standing at 0 minutes  02/06/24 1531 143/80 79 139/80 79 (!) 138/91 80           Final Clinical Impression(s) / ED Diagnoses Final diagnoses:  Syncope and collapse    Rx / DC Orders ED Discharge Orders     None         Catherne Clubs, PA-C 02/06/24 1810    Deatra Face, MD 02/07/24 1422

## 2024-02-06 NOTE — Assessment & Plan Note (Addendum)
 Syncopal episode versus amnesia.  Patient does not remember details from her fall today till how she got to the ED.  She reports similar episode when she was admitted 10/25/2023 for PE and DVT.   On anticoagulation and compliance.  She denies cardiorespiratory symptoms.  Memory seems to be back to baseline.  CT and MRI brain without acute findings, shows chronic remote infarcts.  EKG without acute abnormality.  Troponin 4 x 2.  Differentials include cardiac etiology versus transient global amnesia. -Negative orthostatic vitals - Recent echo 01/05/2024 EF-65 to 70%, G1DD. - Monitor on telemetry

## 2024-02-06 NOTE — ED Triage Notes (Signed)
 Pt woke up this morning and doesn't remember why she fell but fell into a wall and hit her head. Pt takes eliquis . Does not remember LOC

## 2024-02-07 ENCOUNTER — Telehealth: Payer: Self-pay

## 2024-02-07 ENCOUNTER — Observation Stay (HOSPITAL_COMMUNITY)
Admit: 2024-02-07 | Discharge: 2024-02-07 | Disposition: A | Discharge status: Home / Self Care | Attending: Internal Medicine | Admitting: Internal Medicine

## 2024-02-07 ENCOUNTER — Encounter

## 2024-02-07 ENCOUNTER — Inpatient Hospital Stay: Payer: Medicare Other

## 2024-02-07 DIAGNOSIS — W19XXXA Unspecified fall, initial encounter: Secondary | ICD-10-CM

## 2024-02-07 DIAGNOSIS — Z87898 Personal history of other specified conditions: Secondary | ICD-10-CM

## 2024-02-07 DIAGNOSIS — R55 Syncope and collapse: Secondary | ICD-10-CM | POA: Diagnosis not present

## 2024-02-07 DIAGNOSIS — R569 Unspecified convulsions: Secondary | ICD-10-CM | POA: Diagnosis not present

## 2024-02-07 LAB — D-DIMER, QUANTITATIVE: D-Dimer, Quant: 0.46 ug{FEU}/mL (ref 0.00–0.50)

## 2024-02-07 MED ORDER — PANTOPRAZOLE SODIUM 40 MG PO TBEC
40.0000 mg | DELAYED_RELEASE_TABLET | Freq: Every day | ORAL | Status: DC
  Administered 2024-02-07: 40 mg via ORAL
  Filled 2024-02-07: qty 1

## 2024-02-07 MED ORDER — IRBESARTAN 75 MG PO TABS
75.0000 mg | ORAL_TABLET | Freq: Every day | ORAL | Status: DC
  Administered 2024-02-07: 75 mg via ORAL
  Filled 2024-02-07: qty 1

## 2024-02-07 MED ORDER — FENOFIBRATE 160 MG PO TABS
160.0000 mg | ORAL_TABLET | Freq: Every day | ORAL | Status: DC
  Administered 2024-02-07: 160 mg via ORAL
  Filled 2024-02-07: qty 1

## 2024-02-07 MED ORDER — APIXABAN 5 MG PO TABS: 5.0000 mg | ORAL_TABLET | Freq: Two times a day (BID) | ORAL | Status: DC

## 2024-02-07 MED ORDER — HYDROCODONE-ACETAMINOPHEN 5-325 MG PO TABS
1.0000 | ORAL_TABLET | ORAL | Status: DC | PRN
  Administered 2024-02-07: 1 via ORAL
  Filled 2024-02-07: qty 1

## 2024-02-07 MED ORDER — RANOLAZINE ER 500 MG PO TB12: 500.0000 mg | ORAL_TABLET | Freq: Two times a day (BID) | ORAL | Status: DC

## 2024-02-07 MED ORDER — VITAMIN D 25 MCG (1000 UNIT) PO TABS
2000.0000 [IU] | ORAL_TABLET | Freq: Every day | ORAL | Status: DC
  Administered 2024-02-07: 2000 [IU] via ORAL
  Filled 2024-02-07: qty 2

## 2024-02-07 MED ORDER — TRAMADOL HCL 50 MG PO TABS: 50.0000 mg | ORAL_TABLET | Freq: Four times a day (QID) | ORAL | Status: DC | PRN

## 2024-02-07 MED ORDER — BRIMONIDINE TARTRATE 0.2 % OP SOLN: 1.0000 [drp] | Freq: Every day | OPHTHALMIC | Status: DC

## 2024-02-07 MED ORDER — LATANOPROST 0.005 % OP SOLN: 1.0000 [drp] | Freq: Every day | OPHTHALMIC | Status: DC

## 2024-02-07 MED ORDER — LIFITEGRAST 5 % OP SOLN
1.0000 [drp] | Freq: Every day | OPHTHALMIC | Status: DC
  Filled 2024-02-07: qty 1

## 2024-02-07 NOTE — Progress Notes (Signed)
 Reached out to office staff to help order 30 day monitor to be mailed to pt's house. Post-hospital f/u arranged per Dr. Learta Prost request, outlined on AVS.

## 2024-02-07 NOTE — Progress Notes (Signed)
Routine EEG complete. Results pending.

## 2024-02-07 NOTE — Hospital Course (Addendum)
 24 yof w/ hx of DVT and PE, hypertension, hypothyroidism, asthma presented to the ED with reports of loss of consciousness -per report she woke up on 02/06/24 morning at about 9 AM, went to the kitchen to get her meds and get water , was back in the bedroom and next she notes she has fallen, and hit her head.She does not remember if or how she fell, does not remember calling her neighbour and friend and does not remember getting to the ED here.  She is unaware of any chest pain or palpitations or difficulty breathing.  She is on Eliquis  and compliant. In ED: Temperature 98.4.  HR 70s.  RR 17-24.  Blood pressure systolic 120s to 846N.  O2 sats > 95% on room air. CT Head >>no acute abnormality, shows remote right medial orbital wall fracture. MRI BRAIN >> without acute abnormality, chronic stable findings - shows remote infarcts, remote right orbital blowout fracture. Hospitalist to admit for syncope vs amnesia. Seen by neurology EEG obtained for completeness, suspect episode could be in the setting of Xanax  use for insomnia.Cardiology consulted as well She will benefit cardiac monitor as outpatient- seen by cardiology. PT OT consulted and no ptot need.  Subjective: Seen and examined this morning she is alert awake no complaints Overnight afebrile BP 120s-140s systolic, on room air. Dealing w/ insomnia  AND ON XANAX  PRN- for her chest pain-did take 0.5 mg and repeated does again as instruction and may have precipitated that. Has chronic sternal chest pain takes Ranexa  Also has migraine headache chronically and asking for medication  Discharge diagnoses: Syncope vs amnesia vs likely from her Xanax : Patient unable to remember the details of how she got to the ED or fall.She reports similar episode when she was admitted 10/25/2023 for PE and DVT.  On anticoagulation and compliant .She denies cardiorespiratory symptoms.  Memory seems to be back to baseline.  CT and MRI brain without acute findings, shows  chronic remote infarcts. EKG without acute abnormality.Troponin 4 x 2. Orthostatic vitals negative, recent echo reviewed from 4/2 EF-65 to 70%, G1DD. Neuro eval requested and eeg ordered for completeness and no further workup needed Patient endorses she recently started taking Xanax  which she took the night before then woke up in the morning to go to the bathroom and noticed she was out of water  and started walking towards the kitchen, she felt lightheaded and fell down. So suspecting could be from Xanax  advised to discontinue.  Discussed with cardiology-recent echo 4/20 reviewed.  She will benefit cardiac monitor as outpatient- seen by cardiology. PT OT consulted and no ptot need.  Bilateral PE Hx of PE and DVT: Reports compliance with Eliquis .  Essential hypertension, benign Blood pressure stable.  On irbesartan   Mild anemia likely chronic: Monitor  Chronic knee pain on norco Chronic insomnia: On xanax  prn and ambien  (stopped last week)- now trying xanax  seeing pcp-advised to stop given her episode  Patient refused hospital medication and she is taking her own meds.

## 2024-02-07 NOTE — Evaluation (Signed)
 Physical Therapy Evaluation Patient Details Name: [name] MRN: [id number] DOB: [birth year] Today's Date: 02/07/2024  History of Present Illness  [name] is a 73 y.o. female with medical history significant for DVT and PE, hypertension, hypothyroidism, asthma.  Patient presented to the ED with reports of loss of consciousness today.  She woke up this morning at about 9 AM, went to the kitchen to get her medications and get water .  She was back in the bedroom and next she notes she has fallen, and hit her head.  She does not remember if or how she fell, does not remember calling her neighbour and friend and does not remember getting to the ED here.  She is unaware of any chest pain or palpitations or difficulty breathing.  She is on Eliquis  and compliant.   Clinical Impression  Patient functioning at baseline for functional mobility and gait and has baseline visual deficits. Patient demonstrates good return for ambulating in room, hallways without loss of balance. Plan:  Patient discharged from physical therapy to care of nursing for ambulation daily as tolerated for length of stay.          If plan is discharge home, recommend the following: Other (comment) (at baseline)   Can travel by private vehicle        Equipment Recommendations None recommended by PT  Recommendations for Other Services       Functional Status Assessment       Precautions / Restrictions Precautions Precautions: None Restrictions Weight Bearing Restrictions Per Provider Order: No      Mobility  Bed Mobility Overal bed mobility: Independent                  Transfers Overall transfer level: Independent                      Ambulation/Gait Ambulation/Gait assistance: Independent Gait Distance (Feet): 100 Feet Assistive device: None Gait Pattern/deviations: WFL(Within Functional Limits) Gait velocity: near normal     General Gait Details: grossly WFL with good return  for ambulating in room, hallways without loss of  balance or need for an AD  Stairs            Wheelchair Mobility     Tilt Bed    Modified Rankin (Stroke Patients Only)       Balance Overall balance assessment: Modified Independent                                           Pertinent Vitals/Pain Pain Assessment Pain Assessment: Faces Faces Pain Scale: Hurts a little bit Pain Location: mild headache Pain Descriptors / Indicators: Headache Pain Intervention(s): Monitored during session    Home Living Family/patient expects to be discharged to:: Private residence Living Arrangements: Alone Available Help at Discharge: Neighbor Type of Home: House Home Access: Stairs to enter Entrance Stairs-Rails: None Entrance Stairs-Number of Steps: 2   Home Layout: One level Home Equipment: Agricultural consultant (2 wheels);Shower seat;Grab bars - tub/shower;Wheelchair - manual      Prior Function Prior Level of Function : Independent/Modified Independent             Mobility Comments: Community ambulation without AD, does not drive due to poor vision ADLs Comments: pt independent with ADLs. Pt reports low vision at baseline and unable to drive. Friends assist her with driving to  grocery store etc.     Extremity/Trunk Assessment   Upper Extremity Assessment Upper Extremity Assessment: Overall WFL for tasks assessed    Lower Extremity Assessment Lower Extremity Assessment: Overall WFL for tasks assessed    Cervical / Trunk Assessment Cervical / Trunk Assessment: Normal  Communication   Communication Communication: No apparent difficulties    Cognition Arousal: Alert Behavior During Therapy: WFL for tasks assessed/performed   PT - Cognitive impairments: No apparent impairments                         Following commands: Intact       Cueing Cueing Techniques: Verbal cues     General Comments      Exercises     Assessment/Plan     PT Assessment Patient does not need any further PT services  PT Problem List         PT Treatment Interventions      PT Goals (Current goals can be found in the Care Plan section)  Acute Rehab PT Goals Patient Stated Goal: return home with neighbors to assist PT Goal Formulation: With patient Time For Goal Achievement: 02/07/24 Potential to Achieve Goals: Good    Frequency       Co-evaluation               AM-PAC PT "6 Clicks" Mobility  Outcome Measure Help needed turning from your back to your side while in a flat bed without using bedrails?: None Help needed moving from lying on your back to sitting on the side of a flat bed without using bedrails?: None Help needed moving to and from a bed to a chair (including a wheelchair)?: None Help needed standing up from a chair using your arms (e.g., wheelchair or bedside chair)?: None Help needed to walk in hospital room?: None Help needed climbing 3-5 steps with a railing? : None 6 Click Score: 24    End of Session   Activity Tolerance: Patient tolerated treatment well Patient left: in bed;with call bell/phone within reach Nurse Communication: Mobility status PT Visit Diagnosis: Unsteadiness on feet (R26.81);Other abnormalities of gait and mobility (R26.89);Muscle weakness (generalized) (M62.81)    Time: 1610-9604 PT Time Calculation (min) (ACUTE ONLY): 14 min   Charges:   PT Evaluation $PT Eval Low Complexity: 1 Low PT Treatments $Therapeutic Activity: 8-22 mins PT General Charges $$ ACUTE PT VISIT: 1 Visit         4:11 PM, 02/07/24 Walton Guppy, MPT Physical Therapist with [facility] [phone] office [phone] mobile phone

## 2024-02-07 NOTE — Progress Notes (Deleted)
 PROGRESS NOTE SOMONE MURAT  UJW:[id number] DOB: [birth year] DOA: 02/06/2024 PCP: Meldon Sport, MD  Brief Narrative/Hospital Course: 31 yof w/ hx of DVT and PE, hypertension, hypothyroidism, asthma presented to the ED with reports of loss of consciousness -per report she woke up on 02/06/24 morning at about 9 AM, went to the kitchen to get her meds and get water , was back in the bedroom and next she notes she has fallen, and hit her head.She does not remember if or how she fell, does not remember calling her neighbour and friend and does not remember getting to the ED here.  She is unaware of any chest pain or palpitations or difficulty breathing.  She is on Eliquis  and compliant. In ED: Temperature 98.4.  HR 70s.  RR 17-24.  Blood pressure systolic 120s to 562Z.  O2 sats > 95% on room air. CT Head >>no acute abnormality, shows remote right medial orbital wall fracture. MRI BRAIN >> without acute abnormality, chronic stable findings - shows remote infarcts, remote right orbital blowout fracture. Hospitalist to admit for syncope vs amnesia. Seen by neurology EEG obtained for completeness, suspect episode could be in the setting of Xanax  use for insomnia. Cardiology consulted as well PT OT consulted for disposition  Subjective: Seen and examined this morning she is alert awake no complaints Overnight afebrile BP 120s-140s systolic, on room air. Dealing w/ insomnia  AND ON XANAX  PRN- for her chest pain-did take 0.5 mg and repeated does again as instruction and may have precipitated that. Has chronic sternal chest pain takes Ranexa  Also has migraine headache chronically and asking for medication  Assessment and plan :  Syncope vs amnesia vs likely from her Xanax : Patient unable to remember the details of how she got to the ED or fall.She reports similar episode when she was admitted 10/25/2023 for PE and DVT.  On anticoagulation and compliant .She denies cardiorespiratory symptoms.  Memory seems to  be back to baseline.  CT and MRI brain without acute findings, shows chronic remote infarcts. EKG without acute abnormality.Troponin 4 x 2. Orthostatic vitals negative, recent echo reviewed from 4/2 EF-65 to 70%, G1DD. Neuro eval requested and eeg ordered for completeness and no further workup needed Patient endorses she recently started taking Xanax  which she took the night before then woke up in the morning to go to the bathroom and noticed she was out of water  and started walking towards the kitchen, she felt lightheaded and fell down. So suspecting could be from Xanax  advised to discontinue.  Discussed with cardiology-recent echo 4/20 reviewed.  Likely will benefit cardiac monitor as outpatient PT OT eval for disposition  Bilateral PE Hx of PE and DVT: Reports compliance with Eliquis .  Essential hypertension, benign Blood pressure stable.  On irbesartan   Mild anemia likely chronic: Monitor  Chronic knee pain on norco Chronic insomnia: On xanax  prn and ambien  (stopped last week)- now trying xanax  seeing pcp-advised to stop given her episode  Patient refused hospital medication and she is taking her own meds.   Class I Obesity w/ Body mass index is 32.04 kg/m.: Will benefit with PCP follow-up, weight loss,healthy lifestyle and outpatient sleep eval if not done.  DVT prophylaxis:  Code Status:   Code Status: Full Code Family Communication: plan of care discussed with patient at bedside. Patient status is: Remains hospitalized because of severity of illness Level of care: Telemetry   Dispo: The patient is from: home  Anticipated disposition: TBD-pending PT OT evaluation and cardiology eval Objective: Vitals last 24 hrs: Vitals:   02/06/24 1817 02/06/24 1841 02/06/24 2117 02/07/24 0334  BP: (!) 146/95 (!) 156/80 127/60 (!) 147/71  Pulse: 78 75 91 80  Resp: 18 (!) 21 20 18   Temp:  98 F (36.7 C) 98.6 F (37 C) 98.3 F (36.8 C)  TempSrc:  Oral Oral Oral  SpO2:  96% 99% 99% 98%  Weight:  92.8 kg    Height:  5\' 7"  (1.702 m)      Physical Examination: General exam: alert awake, older than stated age HEENT:Oral mucosa moist, Ear/Nose WNL grossly Respiratory system: B.L clear BS, no use of accessory muscle Cardiovascular system: S1 & S2 +. Gastrointestinal system: Abdomen soft, NT,ND,BS+ Nervous System: Alert, awake, following commands. Extremities: LE edema neg, warm extremities Skin: No rashes,warm. MSK: Normal muscle bulk/tone.   Data Reviewed: I have personally reviewed following labs and imaging studies ( see epic result tab) CBC: Recent Labs  Lab 02/06/24 1232  WBC 5.0  NEUTROABS 2.9  HGB 11.9*  HCT 35.0*  MCV 98.3  PLT 198   CMP: Recent Labs  Lab 02/06/24 1232  NA 141  K 3.5  CL 107  CO2 25  GLUCOSE 127*  BUN 13  CREATININE 1.07*  CALCIUM 8.8*   GFR: Estimated Creatinine Clearance: 56.4 mL/min (A) (by C-G formula based on SCr of 1.07 mg/dL (H)). Recent Labs  Lab 02/06/24 1232  AST 20  ALT 15  ALKPHOS 44  BILITOT 0.7  PROT 7.0  ALBUMIN 3.9   No results for input(s): "LIPASE", "AMYLASE" in the last 168 hours. No results for input(s): "AMMONIA" in the last 168 hours. Coagulation Profile:  Recent Labs  Lab 02/06/24 1232  INR 1.4*   Unresulted Labs (From admission, onward)     Start     Ordered   02/07/24 0500  Cardiolipin antibodies, IgG, IgM, IgA  Tomorrow morning,   R        02/06/24 2007   02/07/24 0500  Beta-2-glycoprotein i abs, IgG/M/A  Tomorrow morning,   R        02/06/24 2007   02/07/24 0500  Lupus anticoagulant panel  Tomorrow morning,   R        02/06/24 2007           Antimicrobials/Microbiology: Anti-infectives (From admission, onward)    None         Component Value Date/Time   SDES URINE, RANDOM 07/28/2015 1425   SPECREQUEST NONE 07/28/2015 1425   CULT  07/28/2015 1425    MULTIPLE SPECIES PRESENT, SUGGEST RECOLLECTION Performed at [facility]    REPTSTATUS 07/30/2015  FINAL 07/28/2015 1425     Medications reviewed:  Scheduled Meds:  apixaban   5 mg Oral BID   brimonidine   1 drop Both Eyes QHS   cholecalciferol  2,000 Units Oral Daily   feeding supplement  237 mL Oral BID BM   fenofibrate   160 mg Oral Daily   irbesartan   75 mg Oral Daily   latanoprost   1 drop Both Eyes QHS   Lifitegrast   1 drop Ophthalmic QHS   pantoprazole   40 mg Oral Daily   ranolazine   500 mg Oral BID   Continuous Infusions:  Lesa Rape, MD [facility] 02/07/2024, 3:20 PM

## 2024-02-07 NOTE — Consult Note (Addendum)
 I connected with  [name] on 02/07/24 by a video enabled telemedicine application and verified that I am speaking with the correct person using two identifiers.   I discussed the limitations of evaluation and management by telemedicine. The patient expressed understanding and agreed to proceed.  Location of patient: [facility] Location of physician: [facility]  Neurology Consultation Reason for Consult: syncope Referring Physician: Dr Lesa Rape  CC: Syncope  History is obtained from: Patient, chart review  HPI: [name] is a 73 y.o. female with past medical history of PE on Eliquis , hypertension, hyperlipidemia, hypothyroidism, legally blind who presented after an episode of syncope/fall.  Patient states she woke up yesterday morning, went to use the bathroom and then noticed that she was out of water .  Therefore she was walking towards kitchen and felt lightheaded, fell down and hurt her head, right side of the eye as well as her right elbow.  Denies any loss of consciousness.  Per ED chart review, she called her friend after the fall and the friend states she sounded confused.  Patient did not remember calling her friend.  Patient states she was recently diagnosed with pulmonary embolism and is on Eliquis .  However since hospital discharge she had trouble sleeping.  Therefore in addition to her Ambien  she was prescribed Xanax  which she just started.  Denies any other concerns.  Denies any seizure-like activity, bladder incontinence, recent illness, palpitations.   ROS: All other systems reviewed and negative except as noted in the HPI.   Past Medical History:  Diagnosis Date   Anal fissure    Anxiety    Arthritis    Asthma    C. difficile diarrhea 09/17/2021   Cataract    Phreesia 01/05/2021   Chest pain    a. Feb 24, 2000 Cath: nl cors, EF 60%;  b. 10/2010 MV: nl LV, no ischemia/infarct;  c. 03/2014 Admit c/p, r/o->grief from husbands death.   Chronic RUQ  pain Feb 24, 2008   EUS slightly dilated CBD (7.42mm), otherwise nl   Depression    Diverticulosis    Eczema    Exposure to chemical inhalation mid 1970s   Fatty liver    G6PD deficiency    Gallstones    GERD (gastroesophageal reflux disease)    Glaucoma    History of pulmonary embolus (PE)    Treated with Eliquis  02-24-15   HTN (hypertension)    Hypercholesteremia    a. intolerant to statins.   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 She has limited vision in the right eye.   Migraines    inner ocular   Ocular migraine    Palpitations    Pleurisy    Pneumonia 07/16/2021   Patient was exposed to a harsh chemical in the 1970's that created scar tissue in her lungs. She has had pneumonia several times in the past.   PONV (postoperative nausea and vomiting)    Pulmonary emboli (HCC)    Recurrent upper respiratory infection (URI)    Renal cyst    Retinal cyst    Sickle cell trait (HCC)    Tubular adenoma of colon 09/17/2011   Urticaria     Family History  Problem Relation Age of Onset   Colon cancer Mother        55s   Urticaria Mother    Heart disease Mother  Colon polyps Mother    Cancer Father        oral   Crohn's disease Sister    Cancer Sister        around heart   Colon polyps Sister    Diabetes Brother    Other Brother        intestinal sugery   Colon polyps Brother    Colon cancer Maternal Grandmother    Colon cancer Maternal Grandfather    Colon cancer Paternal Grandfather    Anesthesia problems Neg Hx     Social History:  reports that she quit smoking about 27 years ago. Her smoking use included cigarettes. She started smoking about 57 years ago. She has a 15 pack-year smoking history. She has never used smokeless tobacco. She reports that she does not currently use alcohol after a past usage of about 4.0 standard drinks of alcohol per week. She reports that she does not  use drugs.   Medications Prior to Admission  Medication Sig Dispense Refill Last Dose/Taking   ALPRAZolam  (XANAX ) 1 MG tablet Take 1 tablet (1 mg total) by mouth 2 (two) times daily as needed for anxiety. 180 tablet 0 02/04/2024   amoxicillin  (AMOXIL ) 500 MG capsule Take 4 capsules at once by mouth 1 hour prior to the dental procedure. 4 capsule 0 Unknown   apixaban  (ELIQUIS ) 5 MG TABS tablet Take 1 tablet (5 mg total) by mouth 2 (two) times daily. 180 tablet 3 02/06/2024 at  9:00 AM   brimonidine  (ALPHAGAN ) 0.2 % ophthalmic solution Place 1 drop into both eyes 2 (two) times daily. (Patient taking differently: Place 1 drop into both eyes at bedtime.) 15 mL 3 02/05/2024 Bedtime   Cholecalciferol (VITAMIN D3) 50 MCG (2000 UT) TABS Take 2,000 Units by mouth daily.   02/06/2024 Morning   dexlansoprazole  (DEXILANT ) 60 MG capsule Take 1 capsule (60 mg total) by mouth daily. 90 capsule 3 02/06/2024 Morning   fenofibrate  (TRICOR ) 145 MG tablet Take 1 tablet (145 mg total) by mouth daily. 90 tablet 3 02/06/2024 Morning   irbesartan  (AVAPRO ) 150 MG tablet Take 0.5 tablets (75 mg total) by mouth daily. 45 tablet 1 02/06/2024 Morning   latanoprost  (XALATAN ) 0.005 % ophthalmic solution Place 1 drop into both eyes at bedtime. 9 mL 3 02/05/2024 Bedtime   Magnesium  500 MG TABS Take 1 tablet by mouth in the morning and at bedtime.   02/06/2024 Morning   nitroGLYCERIN  (NITROLINGUAL ) 0.4 MG/SPRAY spray Place 1 spray under the tongue every 5 (five) minutes as needed for chest pain. 12 g 3 Unknown   ondansetron  (ZOFRAN ) 4 MG tablet TAKE ONE TABLET BY MOUTH EVERY 8 HOURS AS NEEDED FOR NAUSEA OR VOMITING 30 tablet 1 Past Week   Probiotic Product (PROBIOTIC DAILY PO) Take 1 capsule by mouth daily. VSL #3   02/05/2024 Morning   ranolazine  (RANEXA ) 500 MG 12 hr tablet Take 1 tablet (500 mg total) by mouth 2 (two) times daily. 180 tablet 3 02/06/2024 Morning   rizatriptan  (MAXALT -MLT) 10 MG disintegrating tablet DISSOLVE ONE TABLET ON TONGUE AS  NEEDED FOR MIGRAINE. MAY REPEAT IN 2 HOURS IF NEEDED (Patient taking differently: Take 10 mg by mouth as needed for migraine.) 9 tablet 6 Past Month   XIIDRA  5 % SOLN Apply 1 drop to eye at bedtime.   02/05/2024 Bedtime   zolpidem  (AMBIEN ) 10 MG tablet Take 1 tablet (10 mg total) by mouth at bedtime as needed for sleep. 90 tablet 1 02/04/2024   HYDROcodone -acetaminophen  (  NORCO/VICODIN) 5-325 MG tablet Take 1 tablet by mouth every 4 (four) hours as needed for up to 5 days for moderate pain (pain score 4-6). 30 tablet 0 02/02/2024      Exam: Current vital signs: BP (!) 147/71   Pulse 80   Temp 98.3 F (36.8 C) (Oral)   Resp 18   Ht 5\' 7"  (1.702 m)   Wt 92.8 kg   SpO2 98%   BMI 32.04 kg/m  Vital signs in last 24 hours: Temp:  [98 F (36.7 C)-98.6 F (37 C)] 98.3 F (36.8 C) (05/05 0334) Pulse Rate:  [74-91] 80 (05/05 0334) Resp:  [17-24] 18 (05/05 0334) BP: (123-156)/(60-95) 147/71 (05/05 0334) SpO2:  [95 %-99 %] 98 % (05/05 0334) Weight:  [92.1 kg-93 kg] 92.8 kg (05/04 1841)   Physical Exam  Constitutional: Appears well-developed and well-nourished.  Psych: Affect appropriate to situation Neuro: AO x 3, legally blind but rest of the cranial nerves appear grossly intact, antigravity strength without drift in upper extremities, sensory intact to light touch, FTN intact bilaterally  I have reviewed labs in epic and the results pertinent to this consultation are: CBC:  Recent Labs  Lab 02/06/24 1232  WBC 5.0  NEUTROABS 2.9  HGB 11.9*  HCT 35.0*  MCV 98.3  PLT 198    Basic Metabolic Panel:  Lab Results  Component Value Date   NA 141 02/06/2024   K 3.5 02/06/2024   CO2 25 02/06/2024   GLUCOSE 127 (H) 02/06/2024   BUN 13 02/06/2024   CREATININE 1.07 (H) 02/06/2024   CALCIUM 8.8 (L) 02/06/2024   GFRNONAA 56 (L) 02/06/2024   GFRAA 41 (L) 07/22/2020   Lipid Panel:  Lab Results  Component Value Date   LDLCALC 119 (H) 11/03/2023   HgbA1c:  Lab Results  Component  Value Date   HGBA1C 5.1 (A) 11/10/2023   Urine Drug Screen: No results found for: "LABOPIA", "COCAINSCRNUR", "LABBENZ", "AMPHETMU", "THCU", "LABBARB"  Alcohol Level No results found for: "ETH"   I have reviewed the images obtained: CT head without contrast 02/06/2024: No acute intracranial abnormality. Remote right medial orbital wall fracture.  MRI brain without contrast 02/06/2024: No acute intracranial abnormality or significant interval change. 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 Remote right orbital blowout fracture. Elongation of the left globe is again noted.  Carotid duplex 06/14/2023: Color duplex indicates minimal homogeneous plaque, with no hemodynamically significant stenosis by duplex criteria in the extracranial cerebrovascular circulation.   ASSESSMENT/PLAN: 73 year old female who presented after an episode of syncope and fall after waking up in the morning  Syncope - Patient had just woken up in the morning and use the bathroom.  It is possible she had a vasovagal syncope.  Her orthostatic vital signs were negative but she could have had transient orthostasis after waking up in the morning and taking her medications.  Lastly, she did hit her head and therefore transient alteration of awareness could be secondary to concussion -She has had carotid ultrasound done in September of last year which did not show any significant stenosis and therefore low suspicion for cerebral hypoperfusion.  Recommendations: - Routine EEG ordered and pending.  Low suspicion for seizure.  Therefore will not start any antiseizure medications unless definite ictal-interictal abnormality on EEG -Will defer echo and cardiac monitor to hospitalist/cardiologist - No further workup required from neurology standpoint - Okay to continue Eliquis  - Discussed plan with patient as well as Dr. Bobbetta Burnet via secure chat   Thank  you for allowing us  to participate in the care of this patient. If  you have any further questions, please contact  me or neurohospitalist.   Roxy Cordial Epilepsy [facility]

## 2024-02-07 NOTE — Telephone Encounter (Signed)
 Per Dayna Dunn,PA-C: Can you help place order for 30 day monitor to be mailed to this pt's home for syncope under Dr. Mallipeddi please and thank you?      Enrolled in [employer] to be mailed to pt home.

## 2024-02-07 NOTE — Care Management Obs Status (Signed)
 MEDICARE OBSERVATION STATUS NOTIFICATION   Patient Details  Name: [name] MRN: [id number] Date of Birth: 1951-06-17   Medicare Observation Status Notification Given:  Yes    Geraldina Klinefelter, RN 02/07/2024, 5:23 PM

## 2024-02-07 NOTE — Discharge Summary (Signed)
 Physician Discharge Summary  [name] JYN:[id number] DOB: 1951-02-21 DOA: 02/06/2024  PCP: Meldon Sport, MD  Admit date: 02/06/2024 Discharge date: 02/07/2024 Recommendations for Outpatient Follow-up:  Follow up with PCP in 1 weeks-call for appointment Please obtain BMP/CBC in one week Please follow-up with cardiology for 30-day monitor   Discharge Dispo: home Discharge Condition: Stable Code Status:   Code Status: Full Code Diet recommendation:  Diet Order             Diet Heart Fluid consistency: Thin  Diet effective now                    Brief/Interim Summary: 89 yof w/ hx of DVT and PE, hypertension, hypothyroidism, asthma presented to the ED with reports of loss of consciousness -per report she woke up on 02/06/24 morning at about 9 AM, went to the kitchen to get her meds and get water , was back in the bedroom and next she notes she has fallen, and hit her head.She does not remember if or how she fell, does not remember calling her neighbour and friend and does not remember getting to the ED here.  She is unaware of any chest pain or palpitations or difficulty breathing.  She is on Eliquis  and compliant. In ED: Temperature 98.4.  HR 70s.  RR 17-24.  Blood pressure systolic 120s to 865H.  O2 sats > 95% on room air. CT Head >>no acute abnormality, shows remote right medial orbital wall fracture. MRI BRAIN >> without acute abnormality, chronic stable findings - shows remote infarcts, remote right orbital blowout fracture. Hospitalist to admit for syncope vs amnesia. Seen by neurology EEG obtained for completeness, suspect episode could be in the setting of Xanax  use for insomnia.Cardiology consulted as well She will benefit cardiac monitor as outpatient- seen by cardiology. PT OT consulted and no ptot need.  Subjective: Seen and examined this morning she is alert awake no complaints Overnight afebrile BP 120s-140s systolic, on room air. Dealing w/ insomnia  AND ON XANAX   PRN- for her chest pain-did take 0.5 mg and repeated does again as instruction and may have precipitated that. Has chronic sternal chest pain takes Ranexa  Also has migraine headache chronically and asking for medication  Discharge diagnoses: Syncope vs amnesia vs likely from her Xanax : Patient unable to remember the details of how she got to the ED or fall.She reports similar episode when she was admitted 10/25/2023 for PE and DVT.  On anticoagulation and compliant .She denies cardiorespiratory symptoms.  Memory seems to be back to baseline.  CT and MRI brain without acute findings, shows chronic remote infarcts. EKG without acute abnormality.Troponin 4 x 2. Orthostatic vitals negative, recent echo reviewed from 4/2 EF-65 to 70%, G1DD. Neuro eval requested and eeg ordered for completeness and no further workup needed Patient endorses she recently started taking Xanax  which she took the night before then woke up in the morning to go to the bathroom and noticed she was out of water  and started walking towards the kitchen, she felt lightheaded and fell down. So suspecting could be from Xanax  advised to discontinue.  Discussed with cardiology-recent echo 4/20 reviewed.  She will benefit cardiac monitor as outpatient- seen by cardiology. PT OT consulted and no ptot need.  Bilateral PE Hx of PE and DVT: Reports compliance with Eliquis .  Essential hypertension, benign Blood pressure stable.  On irbesartan   Mild anemia likely chronic: Monitor  Chronic knee pain on norco Chronic insomnia: On xanax  prn  and ambien  (stopped last week)- now trying xanax  seeing pcp-advised to stop given her episode  Patient refused hospital medication and she is taking her own meds.  Consults: Neurology, cardiology  Discharge Exam: Vitals:   02/07/24 1545 02/07/24 1547  BP: (!) 161/96 (!) 147/77  Pulse: 75   Resp: 17   Temp: 98.6 F (37 C)   SpO2: 100%    General: Pt is alert, awake, not in acute  distress Cardiovascular: RRR, S1/S2 +, no rubs, no gallops Respiratory: CTA bilaterally, no wheezing, no rhonchi Abdominal: Soft, NT, ND, bowel sounds + Extremities: no edema, no cyanosis  Discharge Instructions  Discharge Instructions     Discharge instructions   Complete by: As directed    Please call call MD or return to ER for similar or worsening recurring problem that brought you to hospital or if any fever,nausea/vomiting,abdominal pain, uncontrolled pain, chest pain,  shortness of breath or any other alarming symptoms.  Please follow-up your doctor as instructed in a week time and call the office for appointment.  Please avoid alcohol, smoking, or any other illicit substance and maintain healthy habits including taking your regular medications as prescribed.  You were cared for by a hospitalist during your hospital stay. If you have any questions about your discharge medications or the care you received while you were in the hospital after you are discharged, you can call the unit and ask to speak with the hospitalist on call if the hospitalist that took care of you is not available.  Once you are discharged, your primary care physician will handle any further medical issues. Please note that NO REFILLS for any discharge medications will be authorized once you are discharged, as it is imperative that you return to your primary care physician (or establish a relationship with a primary care physician if you do not have one) for your aftercare needs so that they can reassess your need for medications and monitor your lab values   Increase activity slowly   Complete by: As directed       Allergies as of 02/07/2024       Reactions   Nutmeg Oil (myristica Oil) Anaphylaxis   Nut oil- skin irritation   Adhesive [tape] Other (See Comments)   Blisters skin   Aspirin Other (See Comments)   Sickle cell trait--  Not recommended unless emergency   Imitrex [sumatriptan] Hives   Levaquin   [levofloxacin  In D5w] Other (See Comments)   Altered mental status Affects G6PD   Statins Other (See Comments)   Myopathy - elevated CK   Keflex  [cephalexin ] Other (See Comments)   G6PD   Latex Rash   Sulfa Antibiotics Other (See Comments)   Patient has sickle cell trait        Medication List     STOP taking these medications    ALPRAZolam  1 MG tablet Commonly known as: XANAX        TAKE these medications    amoxicillin  500 MG capsule Commonly known as: AMOXIL  Take 4 capsules at once by mouth 1 hour prior to the dental procedure.   apixaban  5 MG Tabs tablet Commonly known as: ELIQUIS  Take 1 tablet (5 mg total) by mouth 2 (two) times daily.   brimonidine  0.2 % ophthalmic solution Commonly known as: ALPHAGAN  Place 1 drop into both eyes 2 (two) times daily. What changed: when to take this   dexlansoprazole  60 MG capsule Commonly known as: Dexilant  Take 1 capsule (60 mg total) by mouth daily.  fenofibrate  145 MG tablet Commonly known as: TRICOR  Take 1 tablet (145 mg total) by mouth daily.   HYDROcodone -acetaminophen  5-325 MG tablet Commonly known as: NORCO/VICODIN Take 1 tablet by mouth every 4 (four) hours as needed for up to 5 days for moderate pain (pain score 4-6).   irbesartan  150 MG tablet Commonly known as: AVAPRO  Take 0.5 tablets (75 mg total) by mouth daily.   latanoprost  0.005 % ophthalmic solution Commonly known as: XALATAN  Place 1 drop into both eyes at bedtime.   Magnesium  500 MG Tabs Take 1 tablet by mouth in the morning and at bedtime.   nitroGLYCERIN  0.4 MG/SPRAY spray Commonly known as: NITROLINGUAL  Place 1 spray under the tongue every 5 (five) minutes as needed for chest pain.   ondansetron  4 MG tablet Commonly known as: ZOFRAN  TAKE ONE TABLET BY MOUTH EVERY 8 HOURS AS NEEDED FOR NAUSEA OR VOMITING   PROBIOTIC DAILY PO Take 1 capsule by mouth daily. VSL #3   ranolazine  500 MG 12 hr tablet Commonly known as: Ranexa  Take 1  tablet (500 mg total) by mouth 2 (two) times daily.   rizatriptan  10 MG disintegrating tablet Commonly known as: MAXALT -MLT DISSOLVE ONE TABLET ON TONGUE AS NEEDED FOR MIGRAINE. MAY REPEAT IN 2 HOURS IF NEEDED What changed: See the new instructions.   Vitamin D3 50 MCG (2000 UT) Tabs Take 2,000 Units by mouth daily.   Xiidra  5 % Soln Generic drug: Lifitegrast  Apply 1 drop to eye at bedtime.   zolpidem  10 MG tablet Commonly known as: AMBIEN  Take 1 tablet (10 mg total) by mouth at bedtime as needed for sleep.        Follow-up Information     Meldon Sport, MD Follow up in 1 week(s).   Specialty: Internal Medicine Contact information: [address] [place] [address] [phone]                Allergies  Allergen Reactions   Nutmeg Oil (Myristica Oil) Anaphylaxis    Nut oil- skin irritation   Adhesive [Tape] Other (See Comments)    Blisters skin   Aspirin Other (See Comments)    Sickle cell trait--  Not recommended unless emergency   Imitrex [Sumatriptan] Hives   Levaquin  [Levofloxacin  In D5w] Other (See Comments)    Altered mental status Affects G6PD   Statins Other (See Comments)    Myopathy - elevated CK   Keflex  [Cephalexin ] Other (See Comments)    G6PD   Latex Rash   Sulfa Antibiotics Other (See Comments)    Patient has sickle cell trait    The results of significant diagnostics from this hospitalization (including imaging, microbiology, ancillary and laboratory) are listed below for reference.    Microbiology: No results found for this or any previous visit (from the past 240 hours).  Procedures/Studies: MR BRAIN WO CONTRAST Result Date: 02/06/2024 CLINICAL DATA:  Mental status changes. Fall with loss of consciousness. The patient is an a stick to the event. EXAM: MRI HEAD WITHOUT CONTRAST TECHNIQUE: Multiplanar, multiecho pulse sequences of the brain and surrounding structures were obtained without intravenous contrast. COMPARISON:  CT head  without contrast 02/06/2024. FINDINGS: Brain: No acute infarct, hemorrhage, or mass lesion is present. A remote lacunar infarct is again noted in the posteroinferior right cerebellum. No significant white matter lesions are present. Deep brain nuclei are within normal limits. The ventricles are of normal size. No significant extraaxial fluid collection is present. Brainstem and cerebellum are otherwise within normal limits. The  internal auditory canals are within normal limits. Midline structures are within normal limits. Vascular: Flow is present in the major intracranial arteries. Skull and upper cervical spine: Degenerative changes are present the upper cervical spine. Craniocervical junction is normal. Marrow signal is normal. Sinuses/Orbits: The paranasal sinuses and mastoid air cells are clear. A remote right orbital blowout fracture is noted. Elongation of the left globe is again noted. IMPRESSION: 1. No acute intracranial abnormality or significant interval change. 2. 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 3. Remote right orbital blowout fracture. 4. Elongation of the left globe is again noted. Electronically Signed   By: Audree Leas M.D.   On: 02/06/2024 15:59   DG Elbow Complete Right Result Date: 02/06/2024 CLINICAL DATA:  Fall EXAM: RIGHT ELBOW - COMPLETE 3+ VIEW COMPARISON:  None Available. FINDINGS: There is no evidence of fracture, dislocation, or joint effusion. There is no evidence of arthropathy or other focal bone abnormality. Soft tissues are unremarkable. IMPRESSION: Negative. Electronically Signed   By: Tyron Gallon M.D.   On: 02/06/2024 15:14   DG Shoulder Right Result Date: 02/06/2024 CLINICAL DATA:  Fall and trauma to the right upper extremity. EXAM: RIGHT SHOULDER - 2+ VIEW COMPARISON:  None Available. FINDINGS: No acute fracture or dislocation. The bones are osteopenic. There is degenerative changes of the right AC joint and spurring. The soft tissues  are unremarkable. IMPRESSION: 1. No acute fracture or dislocation. 2. Osteopenia and degenerative changes. Electronically Signed   By: Angus Bark M.D.   On: 02/06/2024 15:13   CT Head Wo Contrast Result Date: 02/06/2024 CLINICAL DATA:  Head trauma, minor (Age >= 65y) Mental status change, unknown cause EXAM: CT HEAD WITHOUT CONTRAST TECHNIQUE: Contiguous axial images were obtained from the base of the skull through the vertex without intravenous contrast. RADIATION DOSE REDUCTION: This exam was performed according to the departmental dose-optimization program which includes automated exposure control, adjustment of the mA and/or kV according to patient size and/or use of iterative reconstruction technique. COMPARISON:  None Available. FINDINGS: Brain: Normal anatomic configuration. Parenchymal volume loss is commensurate with the patient's age. No abnormal intra or extra-axial mass lesion or fluid collection. No abnormal mass effect or midline shift. No evidence of acute intracranial hemorrhage or infarct. Ventricular size is normal. Cerebellum unremarkable. Vascular: No asymmetric hyperdense vasculature at the skull base. Skull: Intact Sinuses/Orbits: Paranasal sinuses are clear. Remote right medial orbital wall fracture. Orbits are otherwise unremarkable. Other: Mastoid air cells and middle ear cavities are clear. IMPRESSION: 1. No acute intracranial abnormality. 2. Remote right medial orbital wall fracture. Electronically Signed   By: Worthy Heads M.D.   On: 02/06/2024 12:46   DG Chest Portable 1 View Result Date: 02/06/2024 CLINICAL DATA:  syncope EXAM: PORTABLE CHEST - 1 VIEW COMPARISON:  12/18/2023 FINDINGS: Lungs are clear. Heart size and mediastinal contours are within normal limits. Aortic Atherosclerosis (ICD10-170.0). No effusion. Visualized bones unremarkable. IMPRESSION: No acute cardiopulmonary disease. Electronically Signed   By: Nicoletta Barrier M.D.   On: 02/06/2024 12:19    Labs: BNP  (last 3 results) Recent Labs    11/04/23 0955 02/06/24 1232  BNP 120.0* 38.0   Basic Metabolic Panel: Recent Labs  Lab 02/06/24 1232  NA 141  K 3.5  CL 107  CO2 25  GLUCOSE 127*  BUN 13  CREATININE 1.07*  CALCIUM 8.8*   Liver Function Tests: Recent Labs  Lab 02/06/24 1232  AST 20  ALT 15  ALKPHOS 44  BILITOT 0.7  PROT 7.0  ALBUMIN 3.9   No results for input(s): "LIPASE", "AMYLASE" in the last 168 hours. No results for input(s): "AMMONIA" in the last 168 hours. CBC: Recent Labs  Lab 02/06/24 1232  WBC 5.0  NEUTROABS 2.9  HGB 11.9*  HCT 35.0*  MCV 98.3  PLT 198  Anemia work up No results for input(s): "VITAMINB12", "FOLATE", "FERRITIN", "TIBC", "IRON", "RETICCTPCT" in the last 72 hours. Urinalysis    Component Value Date/Time   COLORURINE YELLOW 02/06/2024 1540   APPEARANCEUR CLEAR 02/06/2024 1540   LABSPEC 1.010 02/06/2024 1540   PHURINE 6.0 02/06/2024 1540   GLUCOSEU NEGATIVE 02/06/2024 1540   HGBUR NEGATIVE 02/06/2024 1540   BILIRUBINUR NEGATIVE 02/06/2024 1540   BILIRUBINUR negative 10/29/2022 1116   KETONESUR NEGATIVE 02/06/2024 1540   PROTEINUR NEGATIVE 02/06/2024 1540   UROBILINOGEN 0.2 10/29/2022 1116   UROBILINOGEN 0.2 07/28/2015 0717   NITRITE NEGATIVE 02/06/2024 1540   LEUKOCYTESUR SMALL (A) 02/06/2024 1540   Sepsis Labs Recent Labs  Lab 02/06/24 1232  WBC 5.0   Microbiology No results found for this or any previous visit (from the past 240 hours).   Time coordinating discharge: 25 minutes  SIGNED: Lesa Rape, MD  [facility] 02/07/2024, 4:21 PM  If 7PM-7AM, please contact night-coverage [email or web address]

## 2024-02-07 NOTE — Plan of Care (Signed)

## 2024-02-07 NOTE — TOC CM/SW Note (Signed)
 Transition of Care [facility]) - Inpatient Brief Assessment   Patient Details  Name: [name] MRN: [id number] Date of Birth: March 13, 1951  Transition of Care [facility]) CM/SW Contact:    Cyndie Dredge, LCSWA Phone Number: 02/07/2024, 8:28 AM   Clinical Narrative:  Transition of Care Department [facility]) has reviewed patient and no TOC needs have been identified at this time. We will continue to monitor patient advancement through interdisciplinary progression rounds. If new patient transition needs arise, please place a TOC consult.  Transition of Care Asessment: Insurance and Status: Insurance coverage has been reviewed Patient has primary care physician: Yes Home environment has been reviewed: Single Family Home Prior level of function:: Independent Prior/Current Home Services: No current home services Social Drivers of Health Review: SDOH reviewed no interventions necessary Readmission risk has been reviewed: Yes Transition of care needs: no transition of care needs at this time

## 2024-02-07 NOTE — Consult Note (Addendum)
 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  Patient lives by herself.  Can perform her ADLs and household chores with no symptoms.  Does not complain of any angina or DOE ever.  She reported that she got up from her bed to grab water  bottles from the kitchen which was when she fell little dizzy and hit the ground.  She said she feels dizzy usually when she gets up from sitting to standing position quickly which was not she did. she also has poor vision in bilateral eyes.  She also had postictal confusion.  After she woke up, she was only able to tell her name and not any other details.  She gradually regained consciousness.  She thinks this could be from alprazolam  and not entirely sure if she passed out or if he was confused.  She did not have any history of seizure disorder.  Neurology on board.  No prior history of PCI/CABG.  EKG showed NSR.  Past Medical History:  Diagnosis Date   Anal fissure    Anxiety    Arthritis    Asthma    C. difficile diarrhea 09/17/2021   Cataract    Phreesia 01/05/2021   Chest pain    a. 2000/03/09 Cath: nl cors, EF 60%;  b. 10/2010 MV: nl LV, no ischemia/infarct;  c. 03/2014 Admit c/p, r/o->grief from husbands death.   Chronic RUQ pain 03-09-08   EUS slightly dilated CBD (7.22mm), otherwise nl   Depression    Diverticulosis    Eczema    Exposure to chemical inhalation mid 1970s   Fatty liver    G6PD deficiency    Gallstones    GERD (gastroesophageal reflux disease)    Glaucoma    History of pulmonary  embolus (PE)    Treated with Eliquis  10-Mar-2015   HTN (hypertension)    Hypercholesteremia    a. intolerant to statins.   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 She has limited vision in the right eye.   Migraines    inner ocular   Ocular migraine    Palpitations    Pleurisy    Pneumonia 07/16/2021   Patient was exposed to a harsh chemical in the 1970's that created scar tissue in her lungs. She has had pneumonia several times in the past.   PONV (postoperative nausea and vomiting)    Pulmonary emboli (HCC)    Recurrent upper respiratory infection (URI)    Renal cyst    Retinal cyst    Sickle cell trait (HCC)    Tubular adenoma of colon 09/17/2011   Urticaria     Past Surgical History:  Procedure Laterality Date   ABDOMINAL HYSTERECTOMY     ADENOIDECTOMY     ANGIOPLASTY     APPENDECTOMY     BIOPSY N/A 03/14/2013   Procedure: SMALL BOWEL AND GASTRIC BIOPSIES (Procedure #1);  Surgeon: Alyce Jubilee, MD;  Location: [facility];  Service: Endoscopy;  Laterality: N/A;   CARDIAC CATHETERIZATION Left 02/11/2000   CATARACT EXTRACTION Left    CATARACT EXTRACTION W/PHACO Right 03/19/2021   Procedure: CATARACT EXTRACTION PHACO AND INTRAOCULAR LENS PLACEMENT (IOC) RIGHT 6.75 00:50.4;  Surgeon: Annell Kidney, MD;  Location: [facility];  Service: Ophthalmology;  Laterality: Right;   CHOLECYSTECTOMY  12/2007   COLONOSCOPY  12/22/2010   ZOX:WRUEAV, cecal adenomatous polyp   COLONOSCOPY WITH PROPOFOL  N/A 03/31/2016   Dr. Nolene Baumgarten: four sessile polyps rectum/sigmoid colon, diverticulosi, ext/int hemorrhoids. hyperplastic polyps, next tcs 5 years.   COLONOSCOPY WITH PROPOFOL  N/A 04/21/2021   anal fissure, non-bleeding internal hemorrhoids, right colon diverticulosis, one 2 mm polyp in ascending, three 4-6 mm polyps in descending colon. Tubular adenomas. 5 year surveillance.   complete  hysterectomy     ENTEROSCOPY N/A 03/14/2013   WUJ:WJXB gastritis/ulcers has healed   ESOPHAGOGASTRODUODENOSCOPY  09/22/2011   JYN:WGNF gastritis/Duodenitis   EXCISIONAL HEMORRHOIDECTOMY     FLEXIBLE SIGMOIDOSCOPY N/A 03/14/2013   SLF:3 colon polyp removed/moderate sized internal hemorrhoids   FLEXIBLE SIGMOIDOSCOPY N/A 06/09/2016   Procedure: FLEXIBLE SIGMOIDOSCOPY;  Surgeon: Alyce Jubilee, MD;  Location: [facility];  Service: Endoscopy;  Laterality: N/A;  rectal polyps times 2   FLEXIBLE SIGMOIDOSCOPY N/A 03/18/2018   Procedure: FLEXIBLE SIGMOIDOSCOPY;  Surgeon: Alyce Jubilee, MD;  Location: [facility];  Service: Endoscopy;  Laterality: N/A;  9:15am   FOOT SURGERY     bunion removal left   GIVENS CAPSULE STUDY N/A 02/10/2013   Procedure: GIVENS CAPSULE STUDY;  Surgeon: Alyce Jubilee, MD;  Location: [facility];  Service: Endoscopy;  Laterality: N/A;  730   HEEL SPUR EXCISION     right    HEMORRHOID BANDING N/A 03/14/2013   Procedure: HEMORRHOID BANDING (Procedure #3)  3 bands applied [id number] Exp 02/02/2014 ;  Surgeon: Alyce Jubilee, MD;  Location: [facility];  Service: Endoscopy;  Laterality: N/A;   HEMORRHOID SURGERY N/A 04/24/2016   Procedure: EXTENSIVE HEMORRHOIDECTOMY;  Surgeon: Franki Isles, MD;  Location: [facility];  Service: General;  Laterality: N/A;   LAPAROSCOPY     adhesions   POLYPECTOMY N/A 03/14/2013   ONG:EXBM Gastritis . ULCERS SEEN ON MAY 6 HAVE HEALED   POLYPECTOMY  03/31/2016   Procedure: POLYPECTOMY;  Surgeon: Alyce Jubilee, MD;  Location: [facility];  Service: Endoscopy;;  sigmoid colon polyps x2, rectal polyps x2   POLYPECTOMY  04/21/2021   Procedure: POLYPECTOMY;  Surgeon: Vinetta Greening, DO;  Location: [facility];  Service: Endoscopy;;   RECTAL EXAM UNDER ANESTHESIA N/A 07/23/2021   Procedure: RECTAL EXAM UNDER ANESTHESIA;  Surgeon: Joyce Nixon, MD;  Location: [facility];  Service: General;  Laterality: N/A;    SPHINCTEROTOMY N/A 07/23/2021   Procedure: CHEMICAL SPHINCTEROTOMY;  Surgeon: Joyce Nixon, MD;  Location: [facility];  Service: General;  Laterality: N/A;   TONSILLECTOMY     TUBAL LIGATION         Inpatient Medications: Scheduled Meds:  apixaban   5 mg Oral BID   brimonidine   1 drop Both Eyes QHS   cholecalciferol  2,000 Units Oral Daily   feeding supplement  237 mL Oral BID BM   fenofibrate   160 mg Oral Daily   irbesartan   75 mg Oral Daily   latanoprost   1 drop Both Eyes QHS   Lifitegrast   1 drop Ophthalmic QHS   pantoprazole   40 mg Oral Daily   ranolazine   500 mg  Oral BID   Continuous Infusions:  PRN Meds: acetaminophen  **OR** acetaminophen , HYDROcodone -acetaminophen , ondansetron  **OR** ondansetron  (ZOFRAN ) IV, polyethylene glycol, SUMAtriptan  Allergies:    Allergies  Allergen Reactions   Nutmeg Oil (Myristica Oil) Anaphylaxis    Nut oil- skin irritation   Adhesive [Tape] Other (See Comments)    Blisters skin   Aspirin Other (See Comments)    Sickle cell trait--  Not recommended unless emergency   Imitrex [Sumatriptan] Hives   Levaquin  [Levofloxacin  In D5w] Other (See Comments)    Altered mental status Affects G6PD   Statins Other (See Comments)    Myopathy - elevated CK   Keflex  [Cephalexin ] Other (See Comments)    G6PD   Latex Rash   Sulfa Antibiotics Other (See Comments)    Patient has sickle cell trait    Social History:   Social History   Socioeconomic History   Marital status: Widowed    Spouse name: Not on file   Number of children: 1   Years of education: Not on file   Highest education level: Not on file  Occupational History   Occupation: homemaker    Employer: UNEMPLOYED  Tobacco Use   Smoking status: Former    Current packs/day: 0.00    Average packs/day: 0.5 packs/day for 30.0 years (15.0 ttl pk-yrs)    Types: Cigarettes    Start date: 10/15/1966    Quit date: 10/15/1996    Years since quitting: 27.3   Smokeless  tobacco: Never   Tobacco comments:    Stopped smoking ~ 2000  Vaping Use   Vaping status: Never Used  Substance and Sexual Activity   Alcohol use: Not Currently    Alcohol/week: 4.0 standard drinks of alcohol    Types: 4 Glasses of wine per week    Comment: 1 drinks every couple of weeks   Drug use: No   Sexual activity: Not Currently    Birth control/protection: Surgical  Other Topics Concern   Not on file  Social History Narrative   Does not routinely exercise. Husband passed in JUN 2015 due to prostate ca.   Social Drivers of Corporate investment banker Strain: Low Risk  (06/11/2023)   Overall Financial Resource Strain (CARDIA)    Difficulty of Paying Living Expenses: Not hard at all  Food Insecurity: No Food Insecurity (02/06/2024)   Hunger Vital Sign    Worried About Running Out of Food in the Last Year: Never true    Ran Out of Food in the Last Year: Never true  Transportation Needs: No Transportation Needs (02/06/2024)   PRAPARE - Administrator, Civil Service (Medical): No    Lack of Transportation (Non-Medical): No  Recent Concern: Transportation Needs - Unmet Transportation Needs (12/30/2023)   PRAPARE - Transportation    Lack of Transportation (Medical): Yes    Lack of Transportation (Non-Medical): Yes  Physical Activity: Sufficiently Active (06/11/2023)   Exercise Vital Sign    Days of Exercise per Week: 6 days    Minutes of Exercise per Session: 30 min  Stress: No Stress Concern Present (06/11/2023)   [employer] of Occupational Health - Occupational Stress Questionnaire    Feeling of Stress : Not at all  Social Connections: Socially Isolated (02/06/2024)   Social Connection and Isolation Panel [NHANES]    Frequency of Communication with Friends and Family: Three times a week    Frequency of Social Gatherings with Friends and Family: Once a week    Attends Religious Services:  Never    Active Member of Clubs or Organizations: No    Attends Tax inspector Meetings: Never    Marital Status: Widowed  Intimate Partner Violence: Not At Risk (02/06/2024)   Humiliation, Afraid, Rape, and Kick questionnaire    Fear of Current or Ex-Partner: No    Emotionally Abused: No    Physically Abused: No    Sexually Abused: No    Family History:    Family History  Problem Relation Age of Onset   Colon cancer Mother        19s   Urticaria Mother    Heart disease Mother    Colon polyps Mother    Cancer Father        oral   Crohn's disease Sister    Cancer Sister        around heart   Colon polyps Sister    Diabetes Brother    Other Brother        intestinal sugery   Colon polyps Brother    Colon cancer Maternal Grandmother    Colon cancer Maternal Grandfather    Colon cancer Paternal Grandfather    Anesthesia problems Neg Hx      ROS:  Please see the history of present illness.  ROS  All other ROS reviewed and negative.     Physical Exam/Data:   Vitals:   02/06/24 2117 02/07/24 0334 02/07/24 1545 02/07/24 1547  BP: 127/60 (!) 147/71 (!) 161/96 (!) 147/77  Pulse: 91 80 75   Resp: 20 18 17    Temp: 98.6 F (37 C) 98.3 F (36.8 C) 98.6 F (37 C)   TempSrc: Oral Oral Oral   SpO2: 99% 98% 100%   Weight:      Height:        Intake/Output Summary (Last 24 hours) at 02/07/2024 1611 Last data filed at 02/07/2024 2956 Gross per 24 hour  Intake 240 ml  Output --  Net 240 ml   Filed Weights   02/06/24 1107 02/06/24 1108 02/06/24 1841  Weight: 93 kg 92.1 kg 92.8 kg   Body mass index is 32.04 kg/m.  General:  Well nourished, well developed, in no acute distress HEENT: normal Lymph: no adenopathy Neck: no JVD Endocrine:  No thryomegaly Vascular: No carotid bruits; FA pulses 2+ bilaterally without bruits  Cardiac:  normal S1, S2; RRR; no murmur  Lungs:  clear to auscultation bilaterally, no wheezing, rhonchi or rales  Abd: soft, nontender, no hepatomegaly  Ext: no edema Musculoskeletal:  No deformities, BUE and BLE  strength normal and equal Skin: warm and dry  Neuro:  CNs 2-12 intact, no focal abnormalities noted Psych:  Normal affect    Laboratory Data:  Chemistry Recent Labs  Lab 02/06/24 1232  NA 141  K 3.5  CL 107  CO2 25  GLUCOSE 127*  BUN 13  CREATININE 1.07*  CALCIUM 8.8*  GFRNONAA 56*  ANIONGAP 9    Recent Labs  Lab 02/06/24 1232  PROT 7.0  ALBUMIN 3.9  AST 20  ALT 15  ALKPHOS 44  BILITOT 0.7   Hematology Recent Labs  Lab 02/06/24 1232  WBC 5.0  RBC 3.56*  HGB 11.9*  HCT 35.0*  MCV 98.3  MCH 33.4  MCHC 34.0  RDW 13.0  PLT 198   Cardiac EnzymesNo results for input(s): "TROPONINI" in the last 168 hours. No results for input(s): "TROPIPOC" in the last 168 hours.  BNP Recent Labs  Lab 02/06/24 1232  BNP 38.0  DDimer  Recent Labs  Lab 02/07/24 0522  DDIMER 0.46    Radiology/Studies:  MR BRAIN WO CONTRAST Result Date: 02/06/2024 CLINICAL DATA:  Mental status changes. Fall with loss of consciousness. The patient is an a stick to the event. EXAM: MRI HEAD WITHOUT CONTRAST TECHNIQUE: Multiplanar, multiecho pulse sequences of the brain and surrounding structures were obtained without intravenous contrast. COMPARISON:  CT head without contrast 02/06/2024. FINDINGS: Brain: No acute infarct, hemorrhage, or mass lesion is present. A remote lacunar infarct is again noted in the posteroinferior right cerebellum. No significant white matter lesions are present. Deep brain nuclei are within normal limits. The ventricles are of normal size. No significant extraaxial fluid collection is present. Brainstem and cerebellum are otherwise within normal limits. The internal auditory canals are within normal limits. Midline structures are within normal limits. Vascular: Flow is present in the major intracranial arteries. Skull and upper cervical spine: Degenerative changes are present the upper cervical spine. Craniocervical junction is normal. Marrow signal is normal.  Sinuses/Orbits: The paranasal sinuses and mastoid air cells are clear. A remote right orbital blowout fracture is noted. Elongation of the left globe is again noted. IMPRESSION: 1. No acute intracranial abnormality or significant interval change. 2. 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 3. Remote right orbital blowout fracture. 4. Elongation of the left globe is again noted. Electronically Signed   By: Audree Leas M.D.   On: 02/06/2024 15:59   DG Elbow Complete Right Result Date: 02/06/2024 CLINICAL DATA:  Fall EXAM: RIGHT ELBOW - COMPLETE 3+ VIEW COMPARISON:  None Available. FINDINGS: There is no evidence of fracture, dislocation, or joint effusion. There is no evidence of arthropathy or other focal bone abnormality. Soft tissues are unremarkable. IMPRESSION: Negative. Electronically Signed   By: Tyron Gallon M.D.   On: 02/06/2024 15:14   DG Shoulder Right Result Date: 02/06/2024 CLINICAL DATA:  Fall and trauma to the right upper extremity. EXAM: RIGHT SHOULDER - 2+ VIEW COMPARISON:  None Available. FINDINGS: No acute fracture or dislocation. The bones are osteopenic. There is degenerative changes of the right AC joint and spurring. The soft tissues are unremarkable. IMPRESSION: 1. No acute fracture or dislocation. 2. Osteopenia and degenerative changes. Electronically Signed   By: Angus Bark M.D.   On: 02/06/2024 15:13   CT Head Wo Contrast Result Date: 02/06/2024 CLINICAL DATA:  Head trauma, minor (Age >= 65y) Mental status change, unknown cause EXAM: CT HEAD WITHOUT CONTRAST TECHNIQUE: Contiguous axial images were obtained from the base of the skull through the vertex without intravenous contrast. RADIATION DOSE REDUCTION: This exam was performed according to the departmental dose-optimization program which includes automated exposure control, adjustment of the mA and/or kV according to patient size and/or use of iterative reconstruction technique. COMPARISON:   None Available. FINDINGS: Brain: Normal anatomic configuration. Parenchymal volume loss is commensurate with the patient's age. No abnormal intra or extra-axial mass lesion or fluid collection. No abnormal mass effect or midline shift. No evidence of acute intracranial hemorrhage or infarct. Ventricular size is normal. Cerebellum unremarkable. Vascular: No asymmetric hyperdense vasculature at the skull base. Skull: Intact Sinuses/Orbits: Paranasal sinuses are clear. Remote right medial orbital wall fracture. Orbits are otherwise unremarkable. Other: Mastoid air cells and middle ear cavities are clear. IMPRESSION: 1. No acute intracranial abnormality. 2. Remote right medial orbital wall fracture. Electronically Signed   By: Worthy Heads M.D.   On: 02/06/2024 12:46   DG Chest Portable 1 View Result Date: 02/06/2024 CLINICAL DATA:  syncope EXAM: PORTABLE CHEST - 1 VIEW COMPARISON:  12/18/2023 FINDINGS: Lungs are clear. Heart size and mediastinal contours are within normal limits. Aortic Atherosclerosis (ICD10-170.0). No effusion. Visualized bones unremarkable. IMPRESSION: No acute cardiopulmonary disease. Electronically Signed   By: Nicoletta Barrier M.D.   On: 02/06/2024 12:19    Assessment and Plan:   Questionable syncope: Presented with prodrome of dizziness when she quickly rushed from her bed to kitchen to grab water  bottles and hit the ground.  Unable to recall if she passed out or felt confused.  She was confused after this event but regained her memory back slowly.  She is also unclear if this is related to alprazolam .  EKG showed NSR, no ischemia.  No angina or DOE.  Echocardiogram from April 2025 showed normal LVEF, normal RVEF and no valvular heart disease.  Will mail 30-day event monitor. Neurology on board to r/o seizure.  Chronic chest pain presumed to be secondary to coronary microvascular disease: On Ranexa  at home.  She underwent multiple NST in the past.  Follow-up with outpatient  cardiology.   [facility] will sign off.   Medication Recommendations: Continue home medications Other recommendations (labs, testing, etc): 30-day event monitor Follow up as an outpatient: Cardiology follow-up in 3 months    For questions or updates, please contact [facility] Please consult [email or web address] for contact info under Cardiology/STEMI.   Signed, Devlynn Knoff Priya Darlisha Kelm, MD 02/07/2024 4:11 PM

## 2024-02-07 NOTE — Procedures (Signed)
 Patient Name: [name]  MRN: [id number]  Epilepsy Attending: Arleene Lack  Referring Physician/Provider: Lesa Rape, MD  Date: 02/07/2024 Duration: 22.38 mins  Patient history: 73yo F with syncope. EEG to evaluate for seizure  Level of alertness: Awake  AEDs during EEG study: None  Technical aspects: This EEG study was done with scalp electrodes positioned according to the 10-20 International system of electrode placement. Electrical activity was reviewed with band pass filter of 1-70Hz , sensitivity of 7 uV/mm, display speed of 31mm/sec with a 60Hz  notched filter applied as appropriate. EEG data were recorded continuously and digitally stored.  Video monitoring was available and reviewed as appropriate.  Description: The posterior dominant rhythm consists of 8Hz  activity of moderate voltage (25-35 uV) seen predominantly in posterior head regions, symmetric and reactive to eye opening and eye closing. Hyperventilation and photic stimulation were not performed.     IMPRESSION: This study is within normal limits. No seizures or epileptiform discharges were seen throughout the recording.  A normal interictal EEG does not exclude the diagnosis of epilepsy.  [name]

## 2024-02-08 LAB — DRVVT MIX: dRVVT Mix: 54.6 s — ABNORMAL HIGH (ref 0.0–40.4)

## 2024-02-08 LAB — LUPUS ANTICOAGULANT PANEL
DRVVT: >180 s — ABNORMAL HIGH (ref 0.0–47.0)
PTT Lupus Anticoagulant: 38.1 s (ref 0.0–43.5)

## 2024-02-08 LAB — DRVVT CONFIRM: dRVVT Confirm: >2 ratio — ABNORMAL HIGH (ref 0.8–1.2)

## 2024-02-09 ENCOUNTER — Telehealth: Payer: Self-pay | Admitting: Internal Medicine

## 2024-02-09 LAB — BETA-2-GLYCOPROTEIN I ABS, IGG/M/A
Beta-2 Glyco I IgG: <9 GPI IgG units (ref 0–20)
Beta-2-Glycoprotein I IgA: <9 GPI IgA units (ref 0–25)
Beta-2-Glycoprotein I IgM: 12 GPI IgM units (ref 0–32)

## 2024-02-09 LAB — CARDIOLIPIN ANTIBODIES, IGG, IGM, IGA
Anticardiolipin IgA: <9 U/mL (ref 0–11)
Anticardiolipin IgG: <9 GPL U/mL (ref 0–14)
Anticardiolipin IgM: 10 [MPL'U]/mL (ref 0–12)

## 2024-02-09 NOTE — Telephone Encounter (Signed)
 Email sent to transportation. Pt informed

## 2024-02-09 NOTE — Telephone Encounter (Signed)
 Copied from CRM [id number]. Topic: General - Transportation >> Feb 09, 2024  9:55 AM Felizardo Hotter wrote: Reason for CRM: Pt needs transportation for appt on 02/17/2024 at 10:45 a.m. Please call pt at  [phone].

## 2024-02-10 ENCOUNTER — Encounter (HOSPITAL_COMMUNITY): Payer: Self-pay

## 2024-02-14 ENCOUNTER — Inpatient Hospital Stay: Payer: Medicare Other | Admitting: Hematology

## 2024-02-14 NOTE — Progress Notes (Incomplete)
 [facility] [address], [place] [address]   Clinic Day:  02/14/2024  Referring physician: Meldon Sport, MD  Patient Care Team: Meldon Sport, MD as PCP - General (Internal Medicine) Gerard Knight, MD as PCP - Cardiology (Cardiology) Alyce Jubilee, MD (Inactive) (Gastroenterology) Diamond Formica, MD as Consulting Physician (Pulmonary Disease)   ASSESSMENT & PLAN:   Assessment: ***  Plan: ***  No orders of the defined types were placed in this encounter.     Nadeen Augusta Teague,acting as a Neurosurgeon for Paulett Boros, MD.,have documented all relevant documentation on the behalf of Paulett Boros, MD,as directed by  Paulett Boros, MD while in the presence of Paulett Boros, MD.   ***  [place] R Teague   5/12/20258:13 AM  CHIEF COMPLAINT/PURPOSE OF CONSULT:   Diagnosis: ***  Current Therapy:  ***  HISTORY OF PRESENT ILLNESS:   Ragad is a 73 y.o. female presenting to clinic today for evaluation of recurrent bilateral PE, G6PD mutation, and sickle cell trait at the request of Meldon Sport, MD.  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   Kamor was recently hospitalized from 02/06/24 to 02/07/24 for syncope and collapse, thought to be from recently prescribed Xanax , with imaging showing no acute findings. She was also noted to have bilateral PE at that time and was taking Eliquis  5 mg BID as prescribed. I was previously consulted about Dashanna from her hospitalization on 11/04/23, also for recurrent PE. She has been seen by cardiology for recurrent PE's.   Her most recent CBC diff from 02/06/24 was abnormal with low RBC at 3.56, low HGB at 11.9, and low HCT at 35.0. Urinalysis from that date  showed small amount of leukocytes and rare bacteria. Cardiolipin antibodies from 02/07/24 were normal, as was Beta-2 -glycoprotein. Lupus anticoagulant from 02/07/24 was abnormal with elevated DRVVT at over 180.0.   Today, she states that she is doing well overall. Her appetite level is at ***%. Her energy level is at ***%.  PAST MEDICAL HISTORY:   Past Medical History: Past Medical History:  Diagnosis Date   Anal fissure    Anxiety    Arthritis    Asthma    C. difficile diarrhea 09/17/2021   Cataract    Phreesia 01/05/2021   Chest pain    a. 02-25-2000 Cath: nl cors, EF 60%;  b. 10/2010 MV: nl LV, no ischemia/infarct;  c. 03/2014 Admit c/p, r/o->grief from husbands death.   Chronic RUQ pain Feb 25, 2008   EUS slightly dilated CBD (7.1mm), otherwise nl   Depression    Diverticulosis    Eczema    Exposure to chemical inhalation mid 1970s   Fatty liver    G6PD deficiency    Gallstones    GERD (gastroesophageal reflux disease)    Glaucoma    History of pulmonary embolus (PE)    Treated with Eliquis  Feb 25, 2015   HTN (hypertension)    Hypercholesteremia    a. intolerant to statins.   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 She has limited vision in the right eye.   Migraines    inner ocular   Ocular migraine    Palpitations  Pleurisy    Pneumonia 07/16/2021   Patient was exposed to a harsh chemical in the 1970's that created scar tissue in her lungs. She has had pneumonia several times in the past.   PONV (postoperative nausea and vomiting)    Pulmonary emboli (HCC)    Recurrent upper respiratory infection (URI)    Renal cyst    Retinal cyst    Sickle cell trait (HCC)    Tubular adenoma of colon 09/17/2011   Urticaria     Surgical History: Past Surgical History:  Procedure Laterality Date   ABDOMINAL HYSTERECTOMY     ADENOIDECTOMY     ANGIOPLASTY     APPENDECTOMY     BIOPSY N/A 03/14/2013    Procedure: SMALL BOWEL AND GASTRIC BIOPSIES (Procedure #1);  Surgeon: Alyce Jubilee, MD;  Location: [facility];  Service: Endoscopy;  Laterality: N/A;   CARDIAC CATHETERIZATION Left 02/11/2000   CATARACT EXTRACTION Left    CATARACT EXTRACTION W/PHACO Right 03/19/2021   Procedure: CATARACT EXTRACTION PHACO AND INTRAOCULAR LENS PLACEMENT (IOC) RIGHT 6.75 00:50.4;  Surgeon: Annell Kidney, MD;  Location: [facility];  Service: Ophthalmology;  Laterality: Right;   CHOLECYSTECTOMY  12/2007   COLONOSCOPY  12/22/2010   WUJ:WJXBJY, cecal adenomatous polyp   COLONOSCOPY WITH PROPOFOL  N/A 03/31/2016   Dr. Nolene Baumgarten: four sessile polyps rectum/sigmoid colon, diverticulosi, ext/int hemorrhoids. hyperplastic polyps, next tcs 5 years.   COLONOSCOPY WITH PROPOFOL  N/A 04/21/2021   anal fissure, non-bleeding internal hemorrhoids, right colon diverticulosis, one 2 mm polyp in ascending, three 4-6 mm polyps in descending colon. Tubular adenomas. 5 year surveillance.   complete hysterectomy     ENTEROSCOPY N/A 03/14/2013   NWG:NFAO gastritis/ulcers has healed   ESOPHAGOGASTRODUODENOSCOPY  09/22/2011   ZHY:QMVH gastritis/Duodenitis   EXCISIONAL HEMORRHOIDECTOMY     FLEXIBLE SIGMOIDOSCOPY N/A 03/14/2013   SLF:3 colon polyp removed/moderate sized internal hemorrhoids   FLEXIBLE SIGMOIDOSCOPY N/A 06/09/2016   Procedure: FLEXIBLE SIGMOIDOSCOPY;  Surgeon: Alyce Jubilee, MD;  Location: [facility];  Service: Endoscopy;  Laterality: N/A;  rectal polyps times 2   FLEXIBLE SIGMOIDOSCOPY N/A 03/18/2018   Procedure: FLEXIBLE SIGMOIDOSCOPY;  Surgeon: Alyce Jubilee, MD;  Location: [facility];  Service: Endoscopy;  Laterality: N/A;  9:15am   FOOT SURGERY     bunion removal left   GIVENS CAPSULE STUDY N/A 02/10/2013   Procedure: GIVENS CAPSULE STUDY;  Surgeon: Alyce Jubilee, MD;  Location: [facility];  Service: Endoscopy;  Laterality: N/A;  730   HEEL SPUR EXCISION     right    HEMORRHOID BANDING N/A  03/14/2013   Procedure: HEMORRHOID BANDING (Procedure #3)  3 bands applied [id number] Exp 02/02/2014 ;  Surgeon: Alyce Jubilee, MD;  Location: [facility];  Service: Endoscopy;  Laterality: N/A;   HEMORRHOID SURGERY N/A 04/24/2016   Procedure: EXTENSIVE HEMORRHOIDECTOMY;  Surgeon: Franki Isles, MD;  Location: [facility];  Service: General;  Laterality: N/A;   LAPAROSCOPY     adhesions   POLYPECTOMY N/A 03/14/2013   XLK:GMWN Gastritis . ULCERS SEEN ON MAY 6 HAVE HEALED   POLYPECTOMY  03/31/2016   Procedure: POLYPECTOMY;  Surgeon: Alyce Jubilee, MD;  Location: [facility];  Service: Endoscopy;;  sigmoid colon polyps x2, rectal polyps x2   POLYPECTOMY  04/21/2021   Procedure: POLYPECTOMY;  Surgeon: Vinetta Greening, DO;  Location: [facility];  Service: Endoscopy;;   RECTAL EXAM UNDER ANESTHESIA N/A 07/23/2021   Procedure: RECTAL EXAM UNDER ANESTHESIA;  Surgeon: Andy Bannister,  Evalyn Hillier, MD;  Location: [facility];  Service: General;  Laterality: N/A;   SPHINCTEROTOMY N/A 07/23/2021   Procedure: CHEMICAL SPHINCTEROTOMY;  Surgeon: Joyce Nixon, MD;  Location: [facility];  Service: General;  Laterality: N/A;   TONSILLECTOMY     TUBAL LIGATION      Social History: Social History   Socioeconomic History   Marital status: Widowed    Spouse name: Not on file   Number of children: 1   Years of education: Not on file   Highest education level: Not on file  Occupational History   Occupation: homemaker    Employer: UNEMPLOYED  Tobacco Use   Smoking status: Former    Current packs/day: 0.00    Average packs/day: 0.5 packs/day for 30.0 years (15.0 ttl pk-yrs)    Types: Cigarettes    Start date: 10/15/1966    Quit date: 10/15/1996    Years since quitting: 27.3   Smokeless tobacco: Never   Tobacco comments:    Stopped smoking ~ 2000  Vaping Use   Vaping status: Never Used  Substance and Sexual Activity   Alcohol use: Not Currently    Alcohol/week: 4.0 standard  drinks of alcohol    Types: 4 Glasses of wine per week    Comment: 1 drinks every couple of weeks   Drug use: No   Sexual activity: Not Currently    Birth control/protection: Surgical  Other Topics Concern   Not on file  Social History Narrative   Does not routinely exercise. Husband passed in JUN 2015 due to prostate ca.   Social Drivers of Corporate investment banker Strain: Low Risk  (06/11/2023)   Overall Financial Resource Strain (CARDIA)    Difficulty of Paying Living Expenses: Not hard at all  Food Insecurity: No Food Insecurity (02/06/2024)   Hunger Vital Sign    Worried About Running Out of Food in the Last Year: Never true    Ran Out of Food in the Last Year: Never true  Transportation Needs: No Transportation Needs (02/06/2024)   PRAPARE - Administrator, Civil Service (Medical): No    Lack of Transportation (Non-Medical): No  Recent Concern: Transportation Needs - Unmet Transportation Needs (12/30/2023)   PRAPARE - Transportation    Lack of Transportation (Medical): Yes    Lack of Transportation (Non-Medical): Yes  Physical Activity: Sufficiently Active (06/11/2023)   Exercise Vital Sign    Days of Exercise per Week: 6 days    Minutes of Exercise per Session: 30 min  Stress: No Stress Concern Present (06/11/2023)   [employer] of Occupational Health - Occupational Stress Questionnaire    Feeling of Stress : Not at all  Social Connections: Socially Isolated (02/06/2024)   Social Connection and Isolation Panel [NHANES]    Frequency of Communication with Friends and Family: Three times a week    Frequency of Social Gatherings with Friends and Family: Once a week    Attends Religious Services: Never    Database administrator or Organizations: No    Attends Banker Meetings: Never    Marital Status: Widowed  Intimate Partner Violence: Not At Risk (02/06/2024)   Humiliation, Afraid, Rape, and Kick questionnaire    Fear of Current or Ex-Partner: No     Emotionally Abused: No    Physically Abused: No    Sexually Abused: No    Family History: Family History  Problem Relation Age of Onset   Colon cancer Mother  7s   Urticaria Mother    Heart disease Mother    Colon polyps Mother    Cancer Father        oral   Crohn's disease Sister    Cancer Sister        around heart   Colon polyps Sister    Diabetes Brother    Other Brother        intestinal sugery   Colon polyps Brother    Colon cancer Maternal Grandmother    Colon cancer Maternal Grandfather    Colon cancer Paternal Grandfather    Anesthesia problems Neg Hx     Current Medications:  Current Outpatient Medications:    amoxicillin  (AMOXIL ) 500 MG capsule, Take 4 capsules at once by mouth 1 hour prior to the dental procedure., Disp: 4 capsule, Rfl: 0   apixaban  (ELIQUIS ) 5 MG TABS tablet, Take 1 tablet (5 mg total) by mouth 2 (two) times daily., Disp: 180 tablet, Rfl: 3   brimonidine  (ALPHAGAN ) 0.2 % ophthalmic solution, Place 1 drop into both eyes 2 (two) times daily. (Patient taking differently: Place 1 drop into both eyes at bedtime.), Disp: 15 mL, Rfl: 3   Cholecalciferol  (VITAMIN D3) 50 MCG (2000 UT) TABS, Take 2,000 Units by mouth daily., Disp: , Rfl:    dexlansoprazole  (DEXILANT ) 60 MG capsule, Take 1 capsule (60 mg total) by mouth daily., Disp: 90 capsule, Rfl: 3   fenofibrate  (TRICOR ) 145 MG tablet, Take 1 tablet (145 mg total) by mouth daily., Disp: 90 tablet, Rfl: 3   irbesartan  (AVAPRO ) 150 MG tablet, Take 0.5 tablets (75 mg total) by mouth daily., Disp: 45 tablet, Rfl: 1   latanoprost  (XALATAN ) 0.005 % ophthalmic solution, Place 1 drop into both eyes at bedtime., Disp: 9 mL, Rfl: 3   Magnesium  500 MG TABS, Take 1 tablet by mouth in the morning and at bedtime., Disp: , Rfl:    nitroGLYCERIN  (NITROLINGUAL ) 0.4 MG/SPRAY spray, Place 1 spray under the tongue every 5 (five) minutes as needed for chest pain., Disp: 12 g, Rfl: 3   ondansetron  (ZOFRAN ) 4 MG  tablet, TAKE ONE TABLET BY MOUTH EVERY 8 HOURS AS NEEDED FOR NAUSEA OR VOMITING, Disp: 30 tablet, Rfl: 1   Probiotic Product (PROBIOTIC DAILY PO), Take 1 capsule by mouth daily. VSL #3, Disp: , Rfl:    ranolazine  (RANEXA ) 500 MG 12 hr tablet, Take 1 tablet (500 mg total) by mouth 2 (two) times daily., Disp: 180 tablet, Rfl: 3   rizatriptan  (MAXALT -MLT) 10 MG disintegrating tablet, DISSOLVE ONE TABLET ON TONGUE AS NEEDED FOR MIGRAINE. MAY REPEAT IN 2 HOURS IF NEEDED (Patient taking differently: Take 10 mg by mouth as needed for migraine.), Disp: 9 tablet, Rfl: 6   XIIDRA  5 % SOLN, Apply 1 drop to eye at bedtime., Disp: , Rfl:    zolpidem  (AMBIEN ) 10 MG tablet, Take 1 tablet (10 mg total) by mouth at bedtime as needed for sleep., Disp: 90 tablet, Rfl: 1   Allergies: Allergies  Allergen Reactions   Nutmeg Oil (Myristica Oil) Anaphylaxis    Nut oil- skin irritation   Adhesive [Tape] Other (See Comments)    Blisters skin   Aspirin Other (See Comments)    Sickle cell trait--  Not recommended unless emergency   Imitrex  [Sumatriptan ] Hives   Levaquin  [Levofloxacin  In D5w] Other (See Comments)    Altered mental status Affects G6PD   Statins Other (See Comments)    Myopathy - elevated CK   Keflex  [Cephalexin ] Other (  See Comments)    G6PD   Latex Rash   Sulfa Antibiotics Other (See Comments)    Patient has sickle cell trait    REVIEW OF SYSTEMS:   Review of Systems  Constitutional:  Negative for chills, fatigue and fever.  HENT:   Negative for lump/mass, mouth sores, nosebleeds, sore throat and trouble swallowing.   Eyes:  Negative for eye problems.  Respiratory:  Negative for cough and shortness of breath.   Cardiovascular:  Negative for chest pain, leg swelling and palpitations.  Gastrointestinal:  Negative for abdominal pain, constipation, diarrhea, nausea and vomiting.  Genitourinary:  Negative for bladder incontinence, difficulty urinating, dysuria, frequency, hematuria and nocturia.    Musculoskeletal:  Negative for arthralgias, back pain, flank pain, myalgias and neck pain.  Skin:  Negative for itching and rash.  Neurological:  Negative for dizziness, headaches and numbness.  Hematological:  Does not bruise/bleed easily.  Psychiatric/Behavioral:  Negative for depression, sleep disturbance and suicidal ideas. The patient is not nervous/anxious.   All other systems reviewed and are negative.    VITALS:   There were no vitals taken for this visit.  Wt Readings from Last 3 Encounters:  02/06/24 204 lb 9.4 oz (92.8 kg)  02/02/24 205 lb (93 kg)  01/27/24 206 lb (93.4 kg)    There is no height or weight on file to calculate BMI.   PHYSICAL EXAM:   Physical Exam Vitals and nursing note reviewed. Exam conducted with a chaperone present.  Constitutional:      Appearance: Normal appearance.  Cardiovascular:     Rate and Rhythm: Normal rate and regular rhythm.     Pulses: Normal pulses.     Heart sounds: Normal heart sounds.  Pulmonary:     Effort: Pulmonary effort is normal.     Breath sounds: Normal breath sounds.  Abdominal:     Palpations: Abdomen is soft. There is no hepatomegaly, splenomegaly or mass.     Tenderness: There is no abdominal tenderness.  Musculoskeletal:     Right lower leg: No edema.     Left lower leg: No edema.  Lymphadenopathy:     Cervical: No cervical adenopathy.     Right cervical: No superficial, deep or posterior cervical adenopathy.    Left cervical: No superficial, deep or posterior cervical adenopathy.     Upper Body:     Right upper body: No supraclavicular or axillary adenopathy.     Left upper body: No supraclavicular or axillary adenopathy.  Neurological:     General: No focal deficit present.     Mental Status: She is alert and oriented to person, place, and time.  Psychiatric:        Mood and Affect: Mood normal.        Behavior: Behavior normal.     LABS:   CBC    Component Value Date/Time   WBC 5.0 02/06/2024  1232   RBC 3.56 (L) 02/06/2024 1232   HGB 11.9 (L) 02/06/2024 1232   HGB 13.0 11/17/2023 0934   HCT 35.0 (L) 02/06/2024 1232   HCT 39.8 11/17/2023 0934   PLT 198 02/06/2024 1232   PLT 246 11/17/2023 0934   MCV 98.3 02/06/2024 1232   MCV 95 11/17/2023 0934   MCH 33.4 02/06/2024 1232   MCHC 34.0 02/06/2024 1232   RDW 13.0 02/06/2024 1232   RDW 11.9 11/17/2023 0934   LYMPHSABS 1.7 02/06/2024 1232   LYMPHSABS 2.1 11/17/2023 0934   MONOABS 0.3 02/06/2024 1232  EOSABS 0.1 02/06/2024 1232   EOSABS 0.3 11/17/2023 0934   BASOSABS 0.0 02/06/2024 1232   BASOSABS 0.1 11/17/2023 0934    CMP    Component Value Date/Time   NA 141 02/06/2024 1232   NA 141 11/17/2023 0934   K 3.5 02/06/2024 1232   K 4.3 07/12/2012 0755   CL 107 02/06/2024 1232   CL 100 07/12/2012 0755   CO2 25 02/06/2024 1232   GLUCOSE 127 (H) 02/06/2024 1232   BUN 13 02/06/2024 1232   BUN 16 11/17/2023 0934   CREATININE 1.07 (H) 02/06/2024 1232   CREATININE 1.23 (H) 12/11/2020 1023   CALCIUM 8.8 (L) 02/06/2024 1232   CALCIUM 11.0 07/12/2012 0755   PROT 7.0 02/06/2024 1232   PROT 6.7 02/01/2023 1411   PROT 8.1 07/12/2012 0755   ALBUMIN 3.9 02/06/2024 1232   ALBUMIN 4.5 02/01/2023 1411   AST 20 02/06/2024 1232   AST 42 07/12/2012 0755   ALT 15 02/06/2024 1232   ALKPHOS 44 02/06/2024 1232   ALKPHOS 32 07/12/2012 0755   BILITOT 0.7 02/06/2024 1232   BILITOT 0.6 02/01/2023 1411   BILITOT 1.0 07/12/2012 0755   GFRNONAA 56 (L) 02/06/2024 1232   GFRNONAA 36 (L) 07/22/2020 1441   GFRAA 41 (L) 07/22/2020 1441    No results found for: "CEA1", "CEA" / No results found for: "CEA1", "CEA" No results found for: "PSA1" No results found for: "ZHY865" No results found for: "CAN125"  No results found for: "TOTALPROTELP", "ALBUMINELP", "A1GS", "A2GS", "BETS", "BETA2SER", "GAMS", "MSPIKE", "SPEI" Lab Results  Component Value Date   TIBC 329 12/12/2009   FERRITIN 455 (H) 12/12/2009   IRONPCTSAT 7 (L) 12/12/2009   No  results found for: "LDH"   STUDIES:   EEG adult Result Date: 02/07/2024 Arleene Lack, MD     02/07/2024  4:44 PM Patient Name: CHRISTALLE BARRER MRN: [id number] Epilepsy Attending: Arleene Lack Referring Physician/Provider: Lesa Rape, MD Date: 02/07/2024 Duration: 22.38 mins Patient history: 73yo F with syncope. EEG to evaluate for seizure Level of alertness: Awake AEDs during EEG study: None Technical aspects: This EEG study was done with scalp electrodes positioned according to the 10-20 International system of electrode placement. Electrical activity was reviewed with band pass filter of 1-70Hz , sensitivity of 7 uV/mm, display speed of 41mm/sec with a 60Hz  notched filter applied as appropriate. EEG data were recorded continuously and digitally stored.  Video monitoring was available and reviewed as appropriate. Description: The posterior dominant rhythm consists of 8Hz  activity of moderate voltage (25-35 uV) seen predominantly in posterior head regions, symmetric and reactive to eye opening and eye closing. Hyperventilation and photic stimulation were not performed.   IMPRESSION: This study is within normal limits. No seizures or epileptiform discharges were seen throughout the recording. A normal interictal EEG does not exclude the diagnosis of epilepsy. Arleene Lack   MR BRAIN WO CONTRAST Result Date: 02/06/2024 CLINICAL DATA:  Mental status changes. Fall with loss of consciousness. The patient is an a stick to the event. EXAM: MRI HEAD WITHOUT CONTRAST TECHNIQUE: Multiplanar, multiecho pulse sequences of the brain and surrounding structures were obtained without intravenous contrast. COMPARISON:  CT head without contrast 02/06/2024. FINDINGS: Brain: No acute infarct, hemorrhage, or mass lesion is present. A remote lacunar infarct is again noted in the posteroinferior right cerebellum. No significant white matter lesions are present. Deep brain nuclei are within normal limits. The ventricles are  of normal size. No significant extraaxial fluid collection is present. Brainstem  and cerebellum are otherwise within normal limits. The internal auditory canals are within normal limits. Midline structures are within normal limits. Vascular: Flow is present in the major intracranial arteries. Skull and upper cervical spine: Degenerative changes are present the upper cervical spine. Craniocervical junction is normal. Marrow signal is normal. Sinuses/Orbits: The paranasal sinuses and mastoid air cells are clear. A remote right orbital blowout fracture is noted. Elongation of the left globe is again noted. IMPRESSION: 1. No acute intracranial abnormality or significant interval change. 2. 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 3. Remote right orbital blowout fracture. 4. Elongation of the left globe is again noted. Electronically Signed   By: Audree Leas M.D.   On: 02/06/2024 15:59   DG Elbow Complete Right Result Date: 02/06/2024 CLINICAL DATA:  Fall EXAM: RIGHT ELBOW - COMPLETE 3+ VIEW COMPARISON:  None Available. FINDINGS: There is no evidence of fracture, dislocation, or joint effusion. There is no evidence of arthropathy or other focal bone abnormality. Soft tissues are unremarkable. IMPRESSION: Negative. Electronically Signed   By: Tyron Gallon M.D.   On: 02/06/2024 15:14   DG Shoulder Right Result Date: 02/06/2024 CLINICAL DATA:  Fall and trauma to the right upper extremity. EXAM: RIGHT SHOULDER - 2+ VIEW COMPARISON:  None Available. FINDINGS: No acute fracture or dislocation. The bones are osteopenic. There is degenerative changes of the right AC joint and spurring. The soft tissues are unremarkable. IMPRESSION: 1. No acute fracture or dislocation. 2. Osteopenia and degenerative changes. Electronically Signed   By: Angus Bark M.D.   On: 02/06/2024 15:13   CT Head Wo Contrast Result Date: 02/06/2024 CLINICAL DATA:  Head trauma, minor (Age >= 65y) Mental status  change, unknown cause EXAM: CT HEAD WITHOUT CONTRAST TECHNIQUE: Contiguous axial images were obtained from the base of the skull through the vertex without intravenous contrast. RADIATION DOSE REDUCTION: This exam was performed according to the departmental dose-optimization program which includes automated exposure control, adjustment of the mA and/or kV according to patient size and/or use of iterative reconstruction technique. COMPARISON:  None Available. FINDINGS: Brain: Normal anatomic configuration. Parenchymal volume loss is commensurate with the patient's age. No abnormal intra or extra-axial mass lesion or fluid collection. No abnormal mass effect or midline shift. No evidence of acute intracranial hemorrhage or infarct. Ventricular size is normal. Cerebellum unremarkable. Vascular: No asymmetric hyperdense vasculature at the skull base. Skull: Intact Sinuses/Orbits: Paranasal sinuses are clear. Remote right medial orbital wall fracture. Orbits are otherwise unremarkable. Other: Mastoid air cells and middle ear cavities are clear. IMPRESSION: 1. No acute intracranial abnormality. 2. Remote right medial orbital wall fracture. Electronically Signed   By: Worthy Heads M.D.   On: 02/06/2024 12:46   DG Chest Portable 1 View Result Date: 02/06/2024 CLINICAL DATA:  syncope EXAM: PORTABLE CHEST - 1 VIEW COMPARISON:  12/18/2023 FINDINGS: Lungs are clear. Heart size and mediastinal contours are within normal limits. Aortic Atherosclerosis (ICD10-170.0). No effusion. Visualized bones unremarkable. IMPRESSION: No acute cardiopulmonary disease. Electronically Signed   By: Nicoletta Barrier M.D.   On: 02/06/2024 12:19

## 2024-02-17 ENCOUNTER — Encounter: Payer: Self-pay | Admitting: Internal Medicine

## 2024-02-17 ENCOUNTER — Ambulatory Visit (INDEPENDENT_AMBULATORY_CARE_PROVIDER_SITE_OTHER): Payer: Self-pay | Admitting: Internal Medicine

## 2024-02-17 VITALS — BP 98/60 | HR 88 | Ht 67.0 in | Wt 198.0 lb

## 2024-02-17 DIAGNOSIS — I10 Essential (primary) hypertension: Secondary | ICD-10-CM

## 2024-02-17 DIAGNOSIS — R630 Anorexia: Secondary | ICD-10-CM | POA: Insufficient documentation

## 2024-02-17 DIAGNOSIS — F5101 Primary insomnia: Secondary | ICD-10-CM

## 2024-02-17 DIAGNOSIS — Z09 Encounter for follow-up examination after completed treatment for conditions other than malignant neoplasm: Secondary | ICD-10-CM

## 2024-02-17 DIAGNOSIS — M159 Polyosteoarthritis, unspecified: Secondary | ICD-10-CM

## 2024-02-17 DIAGNOSIS — F5104 Psychophysiologic insomnia: Secondary | ICD-10-CM

## 2024-02-17 DIAGNOSIS — R55 Syncope and collapse: Secondary | ICD-10-CM

## 2024-02-17 MED ORDER — MIRTAZAPINE 7.5 MG PO TABS: 7.5000 mg | ORAL_TABLET | Freq: Every day | ORAL | 0 refills | Status: DC

## 2024-02-17 MED ORDER — LIDOCAINE 5 % EX OINT: 1.0000 | TOPICAL_OINTMENT | CUTANEOUS | 0 refills | Status: AC | PRN

## 2024-02-17 NOTE — Assessment & Plan Note (Signed)
  BP Readings from Last 1 Encounters:  02/17/24 98/60   Well-controlled with Irbesartan  75 mg every day -advised to hold irbesartan  for now Counseled for compliance with the medications Advised DASH diet and moderate exercise/walking as tolerated

## 2024-02-17 NOTE — Patient Instructions (Addendum)
 Please stop taking Irbesartan  for now.  Please use Lidocaine  ointment for knee pain.  Please start taking Mirtazepine 7.5 mg once at bedtime to improve sleep and appetite.  Please maintain at least 64 ounces of fluid intake and eat at regular intervals.

## 2024-02-17 NOTE — Progress Notes (Unsigned)
 Established Patient Office Visit  Subjective:  Patient ID: [name], female    DOB: 06-Jun-1951  Age: 73 y.o. MRN: [id number]  CC:  Chief Complaint  Patient presents with   Follow-up    Hospital f/u, had trouble sleeping when she was back home, is still struggling with sleep.    Knee Pain    Reports left knee pain.     HPI SAJDA TIER is a 73 y.o. female with past medical history of HTN, GERD, IBS-C, NASH, anal fissure, hypothyroidism, OA, DDD of lumbar spine, anxiety/panic disorder, insomnia, left sided blind eye, G6PD mutation and sickle cell trait who presents for follow-up after recent hospitalization from 02/06/24-02/07/24.  She presented to the ED with reports of loss of consciousness -per report she woke up on 02/06/24 morning at about 9 AM, went to the kitchen to get her meds and get water , was back in the bedroom. Later, she wanted to get up, but suddenly got up from her bed, next she notes she has fallen, and hit her head on her door handle. She vaguely remembers calling her neighbour and friend to help her getting to the ED. She likely had dizziness/lightheadedness before the fall, but does not exactly remember it. She is on Eliquis  and compliant.  Of note, she had taken Xanax  1 mg the night prior for insomnia, but she was feeling okay when she woke up at 9 AM, did not have any drowsiness.  She had CT of head and MRI of brain, which were negative for any acute etiology.  Orthostatic vitals were negative.  Neurology evaluation was done and had EEG, which was also unremarkable.  She is planned to get cardiac monitor in outpatient setting through cardiology.  She had PT OT evaluation prior to discharge, and was deemed safe to be discharged home.  Of note, her BP was low normal today.  She currently takes irbesartan  75 mg QD.  She also takes ranolazine  500 mg twice daily for chest pain episodes.  She also reports decreased appetite and gradual weight loss since 01/25. She has  lost about 17 lbs in the last 6 months.  Denies any diarrhea, nausea or vomiting currently.  She reports bilateral knee pain.  She had steroid injection in left knee on 02/02/24.  Follows up with orthopedic surgeon.  She has Norco as needed for severe pain.  Past Medical History:  Diagnosis Date   Anal fissure    Anxiety    Arthritis    Asthma    C. difficile diarrhea 09/17/2021   Cataract    Phreesia 01/05/2021   Chest pain    a. Feb 27, 2000 Cath: nl cors, EF 60%;  b. 10/2010 MV: nl LV, no ischemia/infarct;  c. 03/2014 Admit c/p, r/o->grief from husbands death.   Chronic RUQ pain 02-27-08   EUS slightly dilated CBD (7.55mm), otherwise nl   Depression    Diverticulosis    Eczema    Exposure to chemical inhalation mid 1970s   Fatty liver    G6PD deficiency    Gallstones    GERD (gastroesophageal reflux disease)    Glaucoma    History of pulmonary embolus (PE)    Treated with Eliquis  02/27/15   HTN (hypertension)    Hypercholesteremia    a. intolerant to statins.   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 She has limited  vision in the right eye.   Migraines    inner ocular   Ocular migraine    Palpitations    Pleurisy    Pneumonia 07/16/2021   Patient was exposed to a harsh chemical in the 1970's that created scar tissue in her lungs. She has had pneumonia several times in the past.   PONV (postoperative nausea and vomiting)    Pulmonary emboli (HCC)    Recurrent upper respiratory infection (URI)    Renal cyst    Retinal cyst    Sickle cell trait (HCC)    Tubular adenoma of colon 09/17/2011   Urticaria     Past Surgical History:  Procedure Laterality Date   ABDOMINAL HYSTERECTOMY     ADENOIDECTOMY     ANGIOPLASTY     APPENDECTOMY     BIOPSY N/A 03/14/2013   Procedure: SMALL BOWEL AND GASTRIC BIOPSIES (Procedure #1);  Surgeon: Alyce Jubilee, MD;  Location: [facility];  Service: Endoscopy;   Laterality: N/A;   CARDIAC CATHETERIZATION Left 02/11/2000   CATARACT EXTRACTION Left    CATARACT EXTRACTION W/PHACO Right 03/19/2021   Procedure: CATARACT EXTRACTION PHACO AND INTRAOCULAR LENS PLACEMENT (IOC) RIGHT 6.75 00:50.4;  Surgeon: Annell Kidney, MD;  Location: [facility];  Service: Ophthalmology;  Laterality: Right;   CHOLECYSTECTOMY  12/2007   COLONOSCOPY  12/22/2010   ZOX:WRUEAV, cecal adenomatous polyp   COLONOSCOPY WITH PROPOFOL  N/A 03/31/2016   Dr. Nolene Baumgarten: four sessile polyps rectum/sigmoid colon, diverticulosi, ext/int hemorrhoids. hyperplastic polyps, next tcs 5 years.   COLONOSCOPY WITH PROPOFOL  N/A 04/21/2021   anal fissure, non-bleeding internal hemorrhoids, right colon diverticulosis, one 2 mm polyp in ascending, three 4-6 mm polyps in descending colon. Tubular adenomas. 5 year surveillance.   complete hysterectomy     ENTEROSCOPY N/A 03/14/2013   WUJ:WJXB gastritis/ulcers has healed   ESOPHAGOGASTRODUODENOSCOPY  09/22/2011   JYN:WGNF gastritis/Duodenitis   EXCISIONAL HEMORRHOIDECTOMY     FLEXIBLE SIGMOIDOSCOPY N/A 03/14/2013   SLF:3 colon polyp removed/moderate sized internal hemorrhoids   FLEXIBLE SIGMOIDOSCOPY N/A 06/09/2016   Procedure: FLEXIBLE SIGMOIDOSCOPY;  Surgeon: Alyce Jubilee, MD;  Location: [facility];  Service: Endoscopy;  Laterality: N/A;  rectal polyps times 2   FLEXIBLE SIGMOIDOSCOPY N/A 03/18/2018   Procedure: FLEXIBLE SIGMOIDOSCOPY;  Surgeon: Alyce Jubilee, MD;  Location: [facility];  Service: Endoscopy;  Laterality: N/A;  9:15am   FOOT SURGERY     bunion removal left   GIVENS CAPSULE STUDY N/A 02/10/2013   Procedure: GIVENS CAPSULE STUDY;  Surgeon: Alyce Jubilee, MD;  Location: [facility];  Service: Endoscopy;  Laterality: N/A;  730   HEEL SPUR EXCISION     right    HEMORRHOID BANDING N/A 03/14/2013   Procedure: HEMORRHOID BANDING (Procedure #3)  3 bands applied [id number] Exp 02/02/2014 ;  Surgeon: Alyce Jubilee,  MD;  Location: [facility];  Service: Endoscopy;  Laterality: N/A;   HEMORRHOID SURGERY N/A 04/24/2016   Procedure: EXTENSIVE HEMORRHOIDECTOMY;  Surgeon: Franki Isles, MD;  Location: [facility];  Service: General;  Laterality: N/A;   LAPAROSCOPY     adhesions   POLYPECTOMY N/A 03/14/2013   ONG:EXBM Gastritis . ULCERS SEEN ON MAY 6 HAVE HEALED   POLYPECTOMY  03/31/2016   Procedure: POLYPECTOMY;  Surgeon: Alyce Jubilee, MD;  Location: [facility];  Service: Endoscopy;;  sigmoid colon polyps x2, rectal polyps x2   POLYPECTOMY  04/21/2021   Procedure: POLYPECTOMY;  Surgeon: Vinetta Greening, DO;  Location: [facility];  Service: Endoscopy;;   RECTAL EXAM UNDER ANESTHESIA N/A 07/23/2021   Procedure: RECTAL EXAM UNDER ANESTHESIA;  Surgeon: Joyce Nixon, MD;  Location: [facility];  Service: General;  Laterality: N/A;   SPHINCTEROTOMY N/A 07/23/2021   Procedure: CHEMICAL SPHINCTEROTOMY;  Surgeon: Joyce Nixon, MD;  Location: [facility];  Service: General;  Laterality: N/A;   TONSILLECTOMY     TUBAL LIGATION      Family History  Problem Relation Age of Onset   Colon cancer Mother        50s   Urticaria Mother    Heart disease Mother    Colon polyps Mother    Cancer Father        oral   Crohn's disease Sister    Cancer Sister        around heart   Colon polyps Sister    Diabetes Brother    Other Brother        intestinal sugery   Colon polyps Brother    Colon cancer Maternal Grandmother    Colon cancer Maternal Grandfather    Colon cancer Paternal Grandfather    Anesthesia problems Neg Hx     Social History   Socioeconomic History   Marital status: Widowed    Spouse name: Not on file   Number of children: 1   Years of education: Not on file   Highest education level: Not on file  Occupational History   Occupation: homemaker    Employer: UNEMPLOYED  Tobacco Use   Smoking status: Former    Current packs/day: 0.00    Average packs/day:  0.5 packs/day for 30.0 years (15.0 ttl pk-yrs)    Types: Cigarettes    Start date: 10/15/1966    Quit date: 10/15/1996    Years since quitting: 27.3   Smokeless tobacco: Never   Tobacco comments:    Stopped smoking ~ 2000  Vaping Use   Vaping status: Never Used  Substance and Sexual Activity   Alcohol use: Not Currently    Alcohol/week: 4.0 standard drinks of alcohol    Types: 4 Glasses of wine per week    Comment: 1 drinks every couple of weeks   Drug use: No   Sexual activity: Not Currently    Birth control/protection: Surgical  Other Topics Concern   Not on file  Social History Narrative   Does not routinely exercise. Husband passed in JUN 2015 due to prostate ca.   Social Drivers of Corporate investment banker Strain: Low Risk  (06/11/2023)   Overall Financial Resource Strain (CARDIA)    Difficulty of Paying Living Expenses: Not hard at all  Food Insecurity: No Food Insecurity (02/06/2024)   Hunger Vital Sign    Worried About Running Out of Food in the Last Year: Never true    Ran Out of Food in the Last Year: Never true  Transportation Needs: No Transportation Needs (02/06/2024)   PRAPARE - Administrator, Civil Service (Medical): No    Lack of Transportation (Non-Medical): No  Recent Concern: Transportation Needs - Unmet Transportation Needs (12/30/2023)   PRAPARE - Transportation    Lack of Transportation (Medical): Yes    Lack of Transportation (Non-Medical): Yes  Physical Activity: Sufficiently Active (06/11/2023)   Exercise Vital Sign    Days of Exercise per Week: 6 days    Minutes of Exercise per Session: 30 min  Stress: No Stress Concern Present (06/11/2023)   [employer] of Occupational Health -  Occupational Stress Questionnaire    Feeling of Stress : Not at all  Social Connections: Socially Isolated (02/06/2024)   Social Connection and Isolation Panel [NHANES]    Frequency of Communication with Friends and Family: Three times a week    Frequency of  Social Gatherings with Friends and Family: Once a week    Attends Religious Services: Never    Database administrator or Organizations: No    Attends Banker Meetings: Never    Marital Status: Widowed  Intimate Partner Violence: Not At Risk (02/06/2024)   Humiliation, Afraid, Rape, and Kick questionnaire    Fear of Current or Ex-Partner: No    Emotionally Abused: No    Physically Abused: No    Sexually Abused: No    Outpatient Medications Prior to Visit  Medication Sig Dispense Refill   amoxicillin  (AMOXIL ) 500 MG capsule Take 4 capsules at once by mouth 1 hour prior to the dental procedure. 4 capsule 0   apixaban  (ELIQUIS ) 5 MG TABS tablet Take 1 tablet (5 mg total) by mouth 2 (two) times daily. 180 tablet 3   brimonidine  (ALPHAGAN ) 0.2 % ophthalmic solution Place 1 drop into both eyes 2 (two) times daily. (Patient taking differently: Place 1 drop into both eyes at bedtime.) 15 mL 3   Cholecalciferol  (VITAMIN D3) 50 MCG (2000 UT) TABS Take 2,000 Units by mouth daily.     dexlansoprazole  (DEXILANT ) 60 MG capsule Take 1 capsule (60 mg total) by mouth daily. 90 capsule 3   fenofibrate  (TRICOR ) 145 MG tablet Take 1 tablet (145 mg total) by mouth daily. 90 tablet 3   latanoprost  (XALATAN ) 0.005 % ophthalmic solution Place 1 drop into both eyes at bedtime. 9 mL 3   Magnesium  500 MG TABS Take 1 tablet by mouth in the morning and at bedtime.     nitroGLYCERIN  (NITROLINGUAL ) 0.4 MG/SPRAY spray Place 1 spray under the tongue every 5 (five) minutes as needed for chest pain. 12 g 3   ondansetron  (ZOFRAN ) 4 MG tablet TAKE ONE TABLET BY MOUTH EVERY 8 HOURS AS NEEDED FOR NAUSEA OR VOMITING 30 tablet 1   Probiotic Product (PROBIOTIC DAILY PO) Take 1 capsule by mouth daily. VSL #3     ranolazine  (RANEXA ) 500 MG 12 hr tablet Take 1 tablet (500 mg total) by mouth 2 (two) times daily. 180 tablet 3   rizatriptan  (MAXALT -MLT) 10 MG disintegrating tablet DISSOLVE ONE TABLET ON TONGUE AS NEEDED FOR  MIGRAINE. MAY REPEAT IN 2 HOURS IF NEEDED (Patient taking differently: Take 10 mg by mouth as needed for migraine.) 9 tablet 6   XIIDRA  5 % SOLN Apply 1 drop to eye at bedtime.     zolpidem  (AMBIEN ) 10 MG tablet Take 1 tablet (10 mg total) by mouth at bedtime as needed for sleep. 90 tablet 1   irbesartan  (AVAPRO ) 150 MG tablet Take 0.5 tablets (75 mg total) by mouth daily. 45 tablet 1   No facility-administered medications prior to visit.    Allergies  Allergen Reactions   Nutmeg Oil (Myristica Oil) Anaphylaxis    Nut oil- skin irritation   Adhesive [Tape] Other (See Comments)    Blisters skin   Aspirin Other (See Comments)    Sickle cell trait--  Not recommended unless emergency   Imitrex  [Sumatriptan ] Hives   Levaquin  [Levofloxacin  In D5w] Other (See Comments)    Altered mental status Affects G6PD   Statins Other (See Comments)    Myopathy - elevated CK  Keflex  [Cephalexin ] Other (See Comments)    G6PD   Latex Rash   Sulfa Antibiotics Other (See Comments)    Patient has sickle cell trait    ROS Review of Systems  Constitutional:  Negative for chills and fever.  HENT:  Positive for congestion. Negative for sore throat.   Eyes:  Positive for photophobia, pain and visual disturbance. Negative for discharge.  Respiratory:  Positive for cough (Intermittent). Negative for shortness of breath and wheezing.   Cardiovascular:  Negative for chest pain and palpitations.  Gastrointestinal:  Positive for constipation. Negative for abdominal pain, blood in stool, diarrhea, nausea and vomiting.  Endocrine: Negative for polydipsia and polyuria.  Genitourinary:  Negative for dysuria and hematuria.  Musculoskeletal:  Positive for arthralgias, back pain, neck pain and neck stiffness.  Skin:  Negative for rash.  Neurological:  Positive for syncope and headaches. Negative for weakness.  Psychiatric/Behavioral:  Negative for agitation and behavioral problems. The patient is nervous/anxious.        Objective:     Physical Exam Vitals reviewed.  Constitutional:      General: She is not in acute distress.    Appearance: She is not diaphoretic.  HENT:     Head: Normocephalic and atraumatic.     Nose: No congestion.     Mouth/Throat:     Mouth: Mucous membranes are moist.  Eyes:     General: No scleral icterus.    Extraocular Movements: Extraocular movements intact.  Cardiovascular:     Rate and Rhythm: Normal rate and regular rhythm.     Heart sounds: Normal heart sounds. No murmur heard. Pulmonary:     Breath sounds: Normal breath sounds. No wheezing or rales.  Musculoskeletal:        General: Tenderness (Lower lumbar spine area) present.     Left hand: Decreased strength of thumb/finger opposition.     Cervical back: Neck supple. No tenderness.     Right knee: Decreased range of motion. Tenderness present.     Left knee: Decreased range of motion. Tenderness present.     Right lower leg: No edema.     Left lower leg: No edema.  Skin:    General: Skin is warm.     Findings: No rash.  Neurological:     General: No focal deficit present.     Mental Status: She is alert and oriented to person, place, and time.     Sensory: No sensory deficit.     Motor: No weakness.  Psychiatric:        Mood and Affect: Mood normal.        Behavior: Behavior normal.     BP 98/60 (BP Location: Left Arm)   Pulse 88   Ht 5\' 7"  (1.702 m)   Wt 198 lb (89.8 kg)   SpO2 95%   BMI 31.01 kg/m  Wt Readings from Last 3 Encounters:  02/17/24 198 lb (89.8 kg)  02/06/24 204 lb 9.4 oz (92.8 kg)  02/02/24 205 lb (93 kg)    Lab Results  Component Value Date   TSH 1.393 11/03/2023   Lab Results  Component Value Date   WBC 5.0 02/06/2024   HGB 11.9 (L) 02/06/2024   HCT 35.0 (L) 02/06/2024   MCV 98.3 02/06/2024   PLT 198 02/06/2024   Lab Results  Component Value Date   NA 141 02/06/2024   K 3.5 02/06/2024   CO2 25 02/06/2024   GLUCOSE 127 (H) 02/06/2024   BUN 13  02/06/2024   CREATININE 1.07 (H) 02/06/2024   BILITOT 0.7 02/06/2024   ALKPHOS 44 02/06/2024   AST 20 02/06/2024   ALT 15 02/06/2024   PROT 7.0 02/06/2024   ALBUMIN 3.9 02/06/2024   CALCIUM 8.8 (L) 02/06/2024   ANIONGAP 9 02/06/2024   EGFR 54 (L) 11/17/2023   Lab Results  Component Value Date   CHOL 176 11/03/2023   Lab Results  Component Value Date   HDL 22 (L) 11/03/2023   Lab Results  Component Value Date   LDLCALC 119 (H) 11/03/2023   Lab Results  Component Value Date   TRIG 173 (H) 11/03/2023   Lab Results  Component Value Date   CHOLHDL 8.0 11/03/2023   Lab Results  Component Value Date   HGBA1C 5.1 (A) 11/10/2023      Assessment & Plan:   Problem List Items Addressed This Visit       Cardiovascular and Mediastinum   Essential hypertension, benign    BP Readings from Last 1 Encounters:  02/17/24 98/60   Well-controlled with Irbesartan  75 mg every day -advised to hold irbesartan  for now Counseled for compliance with the medications Advised DASH diet and moderate exercise/walking as tolerated        Musculoskeletal and Integument   Generalized OA   Takes Tylenol  PRN Has tried lidocaine  ointment recently with adequate relief, new prescription sent Norco only for severe pain Follow up with orthopedic surgeon for knee pain      Relevant Medications   lidocaine  (XYLOCAINE ) 5 % ointment     Other   Chronic insomnia (Chronic)   Usually well-controlled, recently worse due to multiple office visits and increasing stress due to medical conditions Takes Ambien  10 mg qHS PRN Has Xanax  1 mg as needed for anxiety/panic episodes, she did not take Ambien  and Xanax  together Advised to hold Xanax  for now Started Remeron 7.5 mg nightly for insomnia and to improve appetite       Relevant Medications   mirtazapine (REMERON) 7.5 MG tablet   Hospital discharge follow-up   Hospital chart reviewed, including discharge summary Medications reconciled and  reviewed with the patient in detail      Syncope and collapse - Primary   Recent episode of fall likely due to syncope Could be multifactorial - orthostatic hypotension due to sudden change in position, effect of benzodiazepine and/or dehydration Neurologic workup was benign during recent hospitalization Planned to get cardiac monitor Advised to maintain adequate hydration, held irbesartan  for now      Decreased appetite   Lack of appetite and recent weight loss Started Remeron, can improve sleep and appetite      Relevant Medications   mirtazapine (REMERON) 7.5 MG tablet    Meds ordered this encounter  Medications   lidocaine  (XYLOCAINE ) 5 % ointment    Sig: Apply 1 Application topically as needed.    Dispense:  35.44 g    Refill:  0   mirtazapine (REMERON) 7.5 MG tablet    Sig: Take 1 tablet (7.5 mg total) by mouth at bedtime.    Dispense:  90 tablet    Refill:  0    Follow-up: Return if symptoms worsen or fail to improve.    Meldon Sport, MD

## 2024-02-18 NOTE — Assessment & Plan Note (Signed)
 Lack of appetite and recent weight loss Started Remeron, can improve sleep and appetite

## 2024-02-18 NOTE — Assessment & Plan Note (Addendum)
 Takes Tylenol  PRN Has tried lidocaine  ointment recently with adequate relief, new prescription sent Norco only for severe pain Follow up with orthopedic surgeon for knee pain

## 2024-02-18 NOTE — Assessment & Plan Note (Signed)
 Hospital chart reviewed, including discharge summary Medications reconciled and reviewed with the patient in detail

## 2024-02-18 NOTE — Assessment & Plan Note (Signed)
 Recent episode of fall likely due to syncope Could be multifactorial - orthostatic hypotension due to sudden change in position, effect of benzodiazepine and/or dehydration Neurologic workup was benign during recent hospitalization Planned to get cardiac monitor Advised to maintain adequate hydration, held irbesartan  for now

## 2024-02-18 NOTE — Assessment & Plan Note (Signed)
 Usually well-controlled, recently worse due to multiple office visits and increasing stress due to medical conditions Takes Ambien  10 mg qHS PRN Has Xanax  1 mg as needed for anxiety/panic episodes, she did not take Ambien  and Xanax  together Advised to hold Xanax  for now Started Remeron 7.5 mg nightly for insomnia and to improve appetite

## 2024-02-20 DIAGNOSIS — R55 Syncope and collapse: Secondary | ICD-10-CM | POA: Diagnosis not present

## 2024-02-22 ENCOUNTER — Inpatient Hospital Stay: Attending: Hematology | Admitting: Hematology

## 2024-02-22 ENCOUNTER — Ambulatory Visit: Attending: Internal Medicine

## 2024-02-22 VITALS — BP 137/84 | HR 91 | Temp 97.7°F | Resp 16 | Wt 199.1 lb

## 2024-02-22 DIAGNOSIS — D573 Sickle-cell trait: Secondary | ICD-10-CM | POA: Diagnosis not present

## 2024-02-22 DIAGNOSIS — I824Z2 Acute embolism and thrombosis of unspecified deep veins of left distal lower extremity: Secondary | ICD-10-CM

## 2024-02-22 DIAGNOSIS — K7581 Nonalcoholic steatohepatitis (NASH): Secondary | ICD-10-CM | POA: Insufficient documentation

## 2024-02-22 DIAGNOSIS — K589 Irritable bowel syndrome without diarrhea: Secondary | ICD-10-CM | POA: Diagnosis not present

## 2024-02-22 DIAGNOSIS — F32A Depression, unspecified: Secondary | ICD-10-CM | POA: Diagnosis not present

## 2024-02-22 DIAGNOSIS — I700 Atherosclerosis of aorta: Secondary | ICD-10-CM | POA: Diagnosis not present

## 2024-02-22 DIAGNOSIS — I2699 Other pulmonary embolism without acute cor pulmonale: Secondary | ICD-10-CM | POA: Insufficient documentation

## 2024-02-22 DIAGNOSIS — D75A Glucose-6-phosphate dehydrogenase (G6PD) deficiency without anemia: Secondary | ICD-10-CM | POA: Diagnosis not present

## 2024-02-22 DIAGNOSIS — Z79899 Other long term (current) drug therapy: Secondary | ICD-10-CM | POA: Diagnosis not present

## 2024-02-22 DIAGNOSIS — E039 Hypothyroidism, unspecified: Secondary | ICD-10-CM | POA: Diagnosis not present

## 2024-02-22 DIAGNOSIS — Z800 Family history of malignant neoplasm of digestive organs: Secondary | ICD-10-CM | POA: Diagnosis not present

## 2024-02-22 DIAGNOSIS — I10 Essential (primary) hypertension: Secondary | ICD-10-CM | POA: Diagnosis not present

## 2024-02-22 DIAGNOSIS — R55 Syncope and collapse: Secondary | ICD-10-CM

## 2024-02-22 DIAGNOSIS — Z801 Family history of malignant neoplasm of trachea, bronchus and lung: Secondary | ICD-10-CM | POA: Insufficient documentation

## 2024-02-22 DIAGNOSIS — Z86718 Personal history of other venous thrombosis and embolism: Secondary | ICD-10-CM | POA: Insufficient documentation

## 2024-02-22 DIAGNOSIS — Z8673 Personal history of transient ischemic attack (TIA), and cerebral infarction without residual deficits: Secondary | ICD-10-CM | POA: Insufficient documentation

## 2024-02-22 DIAGNOSIS — Z87891 Personal history of nicotine dependence: Secondary | ICD-10-CM | POA: Diagnosis not present

## 2024-02-22 DIAGNOSIS — E7800 Pure hypercholesterolemia, unspecified: Secondary | ICD-10-CM | POA: Diagnosis not present

## 2024-02-22 NOTE — Progress Notes (Signed)
 [facility] [address], [place] [address]    Clinic Day:  02/22/2024  Referring physician: Meldon Sport, MD  Patient Care Team: Meldon Sport, MD as PCP - General (Internal Medicine) Gerard Knight, MD as PCP - Cardiology (Cardiology) Alyce Jubilee, MD (Inactive) (Gastroenterology) Diamond Formica, MD as Consulting Physician (Pulmonary Disease)   ASSESSMENT & PLAN:   Assessment: 1.  Recurrent unprovoked pulmonary embolism: - First episode of unprovoked PE on 07/26/2015 with CT scan showing filling defect in the lower lobe branch of the left pulmonary artery.  Ultrasound Doppler of lower extremities with no DVT.  She was treated with 6 months of Eliquis  which was discontinued. - Presented to the hospital with dyspnea on exertion.  CT angiogram on 11/04/2023 showed multiple bilateral pulmonary emboli with CT evidence of right heart strain consistent with at least submassive pulmonary embolism.  An ultrasound Doppler of the lower extremities showed bilateral leg DVT. - Also has history of sickle cell trait ([place] ancestry), and G6PD deficiency.  Previous history of blood transfusion at the time of delivery. - As she had 2 episodes of unprovoked pulmonary embolism, I have recommended indefinite anticoagulation. 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  2.  Social/family history: - Sisters had miscarriages.  1 sister had pulmonary embolism.  Mother had colon cancer.  Sister had lung cancer.  Father had head and neck cancer and had a history of exposure to radiation.  Plan:  1.  Recurrent unprovoked pulmonary embolism: - We reviewed results of lupus anticoagulant test which were done when she was hospitalized in the end of January 2025.  LA was positive.  Anticardiolipin and antibeta-2 glycoprotein 1 antibodies were negative. - Lupus anticoagulant may be false positive when it was done in the setting of new blood clot and  anticoagulation. - Reviewed ultrasound Doppler from 12/23/2023 of the left leg: Complete resolution of prior DVT.  Her breathing has also gotten better. - Continue Eliquis  twice daily.  RTC 6 months for follow-up with repeat D-dimer and lupus anticoagulant.  Ideally 1 would have to test for lupus anticoagulant when the patient is off of anticoagulation for 4 weeks.  In her situation, as she requires indefinite anticoagulation, I am reluctant to stop anticoagulation to check for the lupus anticoagulant.   Orders Placed This Encounter  Procedures   Lupus anticoagulant panel    Standing Status:   Future    Expected Date:   08/21/2024    Expiration Date:   02/21/2025   D-dimer, quantitative    Standing Status:   Future    Expected Date:   08/21/2024    Expiration Date:   02/21/2025      Hurman Maiden R Teague,acting as a scribe for Paulett Boros, MD.,have documented all relevant documentation on the behalf of Paulett Boros, MD,as directed by  Paulett Boros, MD while in the presence of Paulett Boros, MD.   I, Paulett Boros MD, have reviewed the above documentation for accuracy and completeness, and I agree with the above.   Paulett Boros, MD   5/20/20253:31 PM  CHIEF COMPLAINT:   Diagnosis: Recurrent thromboembolism  Prior Therapy: Eliquis  for 6 months in 2016  Current Therapy: Eliquis  indefinitely   HISTORY OF PRESENT ILLNESS:   Oncology History   No history exists.     INTERVAL HISTORY:   [name] is a 73 y.o. female presenting to clinic today for follow up of recurrent PE. She was  last seen by me on 11/04/23 as an inpatient.  Patient has a medical history of sickle cell trait, G6PD deficiency, hypertension, hyperlipidemia, hypothyroidism, nonalcoholic steatohepatitis, spinal stenosis, DDD, insomnia, anxiety, and depression.   [name] was admitted to the hospital from 11/04/23 to 11/05/23 for recurrent unprovoked bilateral pulmonary emboli that were  treated with a heparin  drip and transitioned to oral Eliquis . She has a prior history of unprovoked PE in 03/09/15 and was on Eliquis  for 6 months after.   [name] was again admitted to the hospital from 02/06/24 to 02/07/24 for syncope and collapse, likely due to recently prescribed Xanax . MRI brain done on 02/06/24 showed: No acute intracranial abnormality or significant interval change. 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 Remote right orbital blowout fracture. Elongation of the left globe is again noted.  Today, she states that she is doing well overall. Her appetite level is at 25%. Her energy level is at 25%.  PAST MEDICAL HISTORY:   Past Medical History: Past Medical History:  Diagnosis Date   Anal fissure    Anxiety    Arthritis    Asthma    C. difficile diarrhea 09/17/2021   Cataract    Phreesia 01/05/2021   Chest pain    a. 02/2000 Cath: nl cors, EF 60%;  b. 10/2010 MV: nl LV, no ischemia/infarct;  c. 08-Mar-2014 Admit c/p, r/o->grief from husbands death.   Chronic RUQ pain 03/08/2008   EUS slightly dilated CBD (7.64mm), otherwise nl   Depression    Diverticulosis    Eczema    Exposure to chemical inhalation mid 1970s   Fatty liver    G6PD deficiency    Gallstones    GERD (gastroesophageal reflux disease)    Glaucoma    History of pulmonary embolus (PE)    Treated with Eliquis  March 09, 2015   HTN (hypertension)    Hypercholesteremia    a. intolerant to statins.   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 She has limited vision in the right eye.   Migraines    inner ocular   Ocular migraine    Palpitations    Pleurisy    Pneumonia 07/16/2021   Patient was exposed to a harsh chemical in the 1970's that created scar tissue in her lungs. She has had pneumonia several times in the past.   PONV (postoperative nausea and vomiting)    Pulmonary emboli (HCC)    Recurrent upper respiratory  infection (URI)    Renal cyst    Retinal cyst    Sickle cell trait (HCC)    Tubular adenoma of colon 09/17/2011   Urticaria     Surgical History: Past Surgical History:  Procedure Laterality Date   ABDOMINAL HYSTERECTOMY     ADENOIDECTOMY     ANGIOPLASTY     APPENDECTOMY     BIOPSY N/A 03/14/2013   Procedure: SMALL BOWEL AND GASTRIC BIOPSIES (Procedure #1);  Surgeon: Alyce Jubilee, MD;  Location: [facility];  Service: Endoscopy;  Laterality: N/A;   CARDIAC CATHETERIZATION Left 02/11/2000   CATARACT EXTRACTION Left    CATARACT EXTRACTION W/PHACO Right 03/19/2021   Procedure: CATARACT EXTRACTION PHACO AND INTRAOCULAR LENS PLACEMENT (IOC) RIGHT 6.75 00:50.4;  Surgeon: Annell Kidney, MD;  Location: [facility];  Service: Ophthalmology;  Laterality: Right;   CHOLECYSTECTOMY  12/2007   COLONOSCOPY  12/22/2010   YNW:GNFAOZ, cecal adenomatous polyp  COLONOSCOPY WITH PROPOFOL  N/A 03/31/2016   Dr. Nolene Baumgarten: four sessile polyps rectum/sigmoid colon, diverticulosi, ext/int hemorrhoids. hyperplastic polyps, next tcs 5 years.   COLONOSCOPY WITH PROPOFOL  N/A 04/21/2021   anal fissure, non-bleeding internal hemorrhoids, right colon diverticulosis, one 2 mm polyp in ascending, three 4-6 mm polyps in descending colon. Tubular adenomas. 5 year surveillance.   complete hysterectomy     ENTEROSCOPY N/A 03/14/2013   DGU:YQIH gastritis/ulcers has healed   ESOPHAGOGASTRODUODENOSCOPY  09/22/2011   KVQ:QVZD gastritis/Duodenitis   EXCISIONAL HEMORRHOIDECTOMY     FLEXIBLE SIGMOIDOSCOPY N/A 03/14/2013   SLF:3 colon polyp removed/moderate sized internal hemorrhoids   FLEXIBLE SIGMOIDOSCOPY N/A 06/09/2016   Procedure: FLEXIBLE SIGMOIDOSCOPY;  Surgeon: Alyce Jubilee, MD;  Location: [facility];  Service: Endoscopy;  Laterality: N/A;  rectal polyps times 2   FLEXIBLE SIGMOIDOSCOPY N/A 03/18/2018   Procedure: FLEXIBLE SIGMOIDOSCOPY;  Surgeon: Alyce Jubilee, MD;  Location: [facility];   Service: Endoscopy;  Laterality: N/A;  9:15am   FOOT SURGERY     bunion removal left   GIVENS CAPSULE STUDY N/A 02/10/2013   Procedure: GIVENS CAPSULE STUDY;  Surgeon: Alyce Jubilee, MD;  Location: [facility];  Service: Endoscopy;  Laterality: N/A;  730   HEEL SPUR EXCISION     right    HEMORRHOID BANDING N/A 03/14/2013   Procedure: HEMORRHOID BANDING (Procedure #3)  3 bands applied [id number] Exp 02/02/2014 ;  Surgeon: Alyce Jubilee, MD;  Location: [facility];  Service: Endoscopy;  Laterality: N/A;   HEMORRHOID SURGERY N/A 04/24/2016   Procedure: EXTENSIVE HEMORRHOIDECTOMY;  Surgeon: Franki Isles, MD;  Location: [facility];  Service: General;  Laterality: N/A;   LAPAROSCOPY     adhesions   POLYPECTOMY N/A 03/14/2013   JJO:ACZY Gastritis . ULCERS SEEN ON MAY 6 HAVE HEALED   POLYPECTOMY  03/31/2016   Procedure: POLYPECTOMY;  Surgeon: Alyce Jubilee, MD;  Location: [facility];  Service: Endoscopy;;  sigmoid colon polyps x2, rectal polyps x2   POLYPECTOMY  04/21/2021   Procedure: POLYPECTOMY;  Surgeon: Vinetta Greening, DO;  Location: [facility];  Service: Endoscopy;;   RECTAL EXAM UNDER ANESTHESIA N/A 07/23/2021   Procedure: RECTAL EXAM UNDER ANESTHESIA;  Surgeon: Joyce Nixon, MD;  Location: [facility];  Service: General;  Laterality: N/A;   SPHINCTEROTOMY N/A 07/23/2021   Procedure: CHEMICAL SPHINCTEROTOMY;  Surgeon: Joyce Nixon, MD;  Location: [facility];  Service: General;  Laterality: N/A;   TONSILLECTOMY     TUBAL LIGATION      Social History: Social History   Socioeconomic History   Marital status: Widowed    Spouse name: Not on file   Number of children: 1   Years of education: Not on file   Highest education level: Not on file  Occupational History   Occupation: homemaker    Employer: UNEMPLOYED  Tobacco Use   Smoking status: Former    Current packs/day: 0.00    Average packs/day: 0.5 packs/day for 30.0 years (15.0 ttl  pk-yrs)    Types: Cigarettes    Start date: 10/15/1966    Quit date: 10/15/1996    Years since quitting: 27.3   Smokeless tobacco: Never   Tobacco comments:    Stopped smoking ~ 2000  Vaping Use   Vaping status: Never Used  Substance and Sexual Activity   Alcohol use: Not Currently    Alcohol/week: 4.0 standard drinks of alcohol    Types: 4 Glasses of wine per  week    Comment: 1 drinks every couple of weeks   Drug use: No   Sexual activity: Not Currently    Birth control/protection: Surgical  Other Topics Concern   Not on file  Social History Narrative   Does not routinely exercise. Husband passed in JUN 2015 due to prostate ca.   Social Drivers of Corporate investment banker Strain: Low Risk  (06/11/2023)   Overall Financial Resource Strain (CARDIA)    Difficulty of Paying Living Expenses: Not hard at all  Food Insecurity: No Food Insecurity (02/06/2024)   Hunger Vital Sign    Worried About Running Out of Food in the Last Year: Never true    Ran Out of Food in the Last Year: Never true  Transportation Needs: No Transportation Needs (02/06/2024)   PRAPARE - Administrator, Civil Service (Medical): No    Lack of Transportation (Non-Medical): No  Recent Concern: Transportation Needs - Unmet Transportation Needs (12/30/2023)   PRAPARE - Transportation    Lack of Transportation (Medical): Yes    Lack of Transportation (Non-Medical): Yes  Physical Activity: Sufficiently Active (06/11/2023)   Exercise Vital Sign    Days of Exercise per Week: 6 days    Minutes of Exercise per Session: 30 min  Stress: No Stress Concern Present (06/11/2023)   [employer] of Occupational Health - Occupational Stress Questionnaire    Feeling of Stress : Not at all  Social Connections: Socially Isolated (02/06/2024)   Social Connection and Isolation Panel [NHANES]    Frequency of Communication with Friends and Family: Three times a week    Frequency of Social Gatherings with Friends and  Family: Once a week    Attends Religious Services: Never    Database administrator or Organizations: No    Attends Banker Meetings: Never    Marital Status: Widowed  Intimate Partner Violence: Not At Risk (02/06/2024)   Humiliation, Afraid, Rape, and Kick questionnaire    Fear of Current or Ex-Partner: No    Emotionally Abused: No    Physically Abused: No    Sexually Abused: No    Family History: Family History  Problem Relation Age of Onset   Colon cancer Mother        47s   Urticaria Mother    Heart disease Mother    Colon polyps Mother    Cancer Father        oral   Crohn's disease Sister    Cancer Sister        around heart   Colon polyps Sister    Diabetes Brother    Other Brother        intestinal sugery   Colon polyps Brother    Colon cancer Maternal Grandmother    Colon cancer Maternal Grandfather    Colon cancer Paternal Grandfather    Anesthesia problems Neg Hx     Current Medications:  Current Outpatient Medications:    albuterol  (PROVENTIL ) (2.5 MG/3ML) 0.083% nebulizer solution, Take 2.5 mg by nebulization every 6 (six) hours as needed for wheezing or shortness of breath., Disp: , Rfl:    albuterol  (VENTOLIN  HFA) 108 (90 Base) MCG/ACT inhaler, Inhale into the lungs every 6 (six) hours as needed for wheezing or shortness of breath., Disp: , Rfl:    ALPRAZolam  (XANAX ) 1 MG tablet, Take 1 mg by mouth at bedtime as needed for anxiety. Takes 1 a day if needed, Disp: , Rfl:    amoxicillin  (AMOXIL )  500 MG capsule, Take 4 capsules at once by mouth 1 hour prior to the dental procedure., Disp: 4 capsule, Rfl: 0   apixaban  (ELIQUIS ) 5 MG TABS tablet, Take 1 tablet (5 mg total) by mouth 2 (two) times daily., Disp: 180 tablet, Rfl: 3   brimonidine  (ALPHAGAN ) 0.2 % ophthalmic solution, Place 1 drop into both eyes 2 (two) times daily. (Patient taking differently: Place 1 drop into both eyes at bedtime.), Disp: 15 mL, Rfl: 3   Cholecalciferol  (VITAMIN D3) 50 MCG  (2000 UT) TABS, Take 2,000 Units by mouth daily., Disp: , Rfl:    dexlansoprazole  (DEXILANT ) 60 MG capsule, Take 1 capsule (60 mg total) by mouth daily., Disp: 90 capsule, Rfl: 3   estradiol  (ESTRACE ) 0.1 MG/GM vaginal cream, Place 1 Applicatorful vaginally at bedtime., Disp: , Rfl:    fenofibrate  (TRICOR ) 145 MG tablet, Take 1 tablet (145 mg total) by mouth daily., Disp: 90 tablet, Rfl: 3   HYDROcodone -acetaminophen  (NORCO/VICODIN) 5-325 MG tablet, Take 1 tablet by mouth every 6 (six) hours as needed for moderate pain (pain score 4-6). Got it from ortho [address] with cone, Disp: , Rfl:    irbesartan  (AVAPRO ) 150 MG tablet, Take 150 mg by mouth daily. Takes 75mg  in morning, Disp: , Rfl:    latanoprost  (XALATAN ) 0.005 % ophthalmic solution, Place 1 drop into both eyes at bedtime., Disp: 9 mL, Rfl: 3   lidocaine  (XYLOCAINE ) 5 % ointment, Apply 1 Application topically as needed., Disp: 35.44 g, Rfl: 0   Magnesium  500 MG TABS, Take 1 tablet by mouth in the morning and at bedtime. Taking 1 in he morning, afternoon, and at bedtime, Disp: , Rfl:    meclizine  (ANTIVERT ) 12.5 MG tablet, Take 12.5 mg by mouth 3 (three) times daily as needed for dizziness., Disp: , Rfl:    metroNIDAZOLE  (METROCREAM ) 0.75 % cream, Apply topically 2 (two) times daily., Disp: , Rfl:    mirtazapine  (REMERON ) 7.5 MG tablet, Take 1 tablet (7.5 mg total) by mouth at bedtime., Disp: 90 tablet, Rfl: 0   nitroGLYCERIN  (NITROLINGUAL ) 0.4 MG/SPRAY spray, Place 1 spray under the tongue every 5 (five) minutes as needed for chest pain., Disp: 12 g, Rfl: 3   ondansetron  (ZOFRAN ) 4 MG tablet, TAKE ONE TABLET BY MOUTH EVERY 8 HOURS AS NEEDED FOR NAUSEA OR VOMITING, Disp: 30 tablet, Rfl: 1   Probiotic Product (PROBIOTIC DAILY PO), Take 1 capsule by mouth daily. VSL #3, Disp: , Rfl:    ranolazine  (RANEXA ) 500 MG 12 hr tablet, Take 1 tablet (500 mg total) by mouth 2 (two) times daily., Disp: 180 tablet, Rfl: 3   rizatriptan  (MAXALT -MLT) 10  MG disintegrating tablet, DISSOLVE ONE TABLET ON TONGUE AS NEEDED FOR MIGRAINE. MAY REPEAT IN 2 HOURS IF NEEDED (Patient taking differently: Take 10 mg by mouth as needed for migraine.), Disp: 9 tablet, Rfl: 6   XIIDRA  5 % SOLN, Apply 1 drop to eye at bedtime., Disp: , Rfl:    zolpidem  (AMBIEN ) 10 MG tablet, Take 1 tablet (10 mg total) by mouth at bedtime as needed for sleep., Disp: 90 tablet, Rfl: 1   Allergies: Allergies  Allergen Reactions   Nutmeg Oil (Myristica Oil) Anaphylaxis    Nut oil- skin irritation   Adhesive [Tape] Other (See Comments)    Blisters skin   Aspirin Other (See Comments)    Sickle cell trait--  Not recommended unless emergency   Imitrex  [Sumatriptan ] Hives   Levaquin  [Levofloxacin  In D5w] Other (See Comments)  Altered mental status Affects G6PD   Statins Other (See Comments)    Myopathy - elevated CK   Keflex  [Cephalexin ] Other (See Comments)    G6PD   Latex Rash   Sulfa Antibiotics Other (See Comments)    Patient has sickle cell trait    REVIEW OF SYSTEMS:   Review of Systems  Constitutional:  Negative for chills, fatigue and fever.  HENT:   Negative for lump/mass, mouth sores, nosebleeds, sore throat and trouble swallowing.   Eyes:  Negative for eye problems.  Respiratory:  Negative for cough and shortness of breath.   Cardiovascular:  Negative for chest pain, leg swelling and palpitations.  Gastrointestinal:  Negative for abdominal pain, constipation, diarrhea, nausea and vomiting.  Genitourinary:  Negative for bladder incontinence, difficulty urinating, dysuria, frequency, hematuria and nocturia.   Musculoskeletal:  Negative for arthralgias, back pain, flank pain, myalgias and neck pain.  Skin:  Negative for itching and rash.  Neurological:  Positive for headaches. Negative for dizziness and numbness.  Hematological:  Does not bruise/bleed easily.  Psychiatric/Behavioral:  Negative for depression, sleep disturbance and suicidal ideas. The patient  is not nervous/anxious.   All other systems reviewed and are negative.    VITALS:   Blood pressure 137/84, pulse 91, temperature 97.7 F (36.5 C), temperature source Oral, resp. rate 16, weight 199 lb 1.2 oz (90.3 kg), SpO2 100%.  Wt Readings from Last 3 Encounters:  02/22/24 199 lb 1.2 oz (90.3 kg)  02/17/24 198 lb (89.8 kg)  02/06/24 204 lb 9.4 oz (92.8 kg)    Body mass index is 31.18 kg/m.  Performance status (ECOG): 1 - Symptomatic but completely ambulatory  PHYSICAL EXAM:   Physical Exam Vitals and nursing note reviewed. Exam conducted with a chaperone present.  Constitutional:      Appearance: Normal appearance.  Cardiovascular:     Rate and Rhythm: Normal rate and regular rhythm.     Pulses: Normal pulses.     Heart sounds: Normal heart sounds.  Pulmonary:     Effort: Pulmonary effort is normal.     Breath sounds: Normal breath sounds.  Abdominal:     Palpations: Abdomen is soft. There is no hepatomegaly, splenomegaly or mass.     Tenderness: There is no abdominal tenderness.  Musculoskeletal:     Right lower leg: No edema.     Left lower leg: No edema.  Lymphadenopathy:     Cervical: No cervical adenopathy.     Right cervical: No superficial, deep or posterior cervical adenopathy.    Left cervical: No superficial, deep or posterior cervical adenopathy.     Upper Body:     Right upper body: No supraclavicular or axillary adenopathy.     Left upper body: No supraclavicular or axillary adenopathy.  Neurological:     General: No focal deficit present.     Mental Status: She is alert and oriented to person, place, and time.  Psychiatric:        Mood and Affect: Mood normal.        Behavior: Behavior normal.     LABS:   CBC     Component Value Date/Time   WBC 5.0 02/06/2024 1232   RBC 3.56 (L) 02/06/2024 1232   HGB 11.9 (L) 02/06/2024 1232   HGB 13.0 11/17/2023 0934   HCT 35.0 (L) 02/06/2024 1232   HCT 39.8 11/17/2023 0934   PLT 198 02/06/2024 1232    PLT 246 11/17/2023 0934   MCV 98.3 02/06/2024 1232  MCV 95 11/17/2023 0934   MCH 33.4 02/06/2024 1232   MCHC 34.0 02/06/2024 1232   RDW 13.0 02/06/2024 1232   RDW 11.9 11/17/2023 0934   LYMPHSABS 1.7 02/06/2024 1232   LYMPHSABS 2.1 11/17/2023 0934   MONOABS 0.3 02/06/2024 1232   EOSABS 0.1 02/06/2024 1232   EOSABS 0.3 11/17/2023 0934   BASOSABS 0.0 02/06/2024 1232   BASOSABS 0.1 11/17/2023 0934    CMP      Component Value Date/Time   NA 141 02/06/2024 1232   NA 141 11/17/2023 0934   K 3.5 02/06/2024 1232   K 4.3 07/12/2012 0755   CL 107 02/06/2024 1232   CL 100 07/12/2012 0755   CO2 25 02/06/2024 1232   GLUCOSE 127 (H) 02/06/2024 1232   BUN 13 02/06/2024 1232   BUN 16 11/17/2023 0934   CREATININE 1.07 (H) 02/06/2024 1232   CREATININE 1.23 (H) 12/11/2020 1023   CALCIUM 8.8 (L) 02/06/2024 1232   CALCIUM 11.0 07/12/2012 0755   PROT 7.0 02/06/2024 1232   PROT 6.7 02/01/2023 1411   PROT 8.1 07/12/2012 0755   ALBUMIN 3.9 02/06/2024 1232   ALBUMIN 4.5 02/01/2023 1411   AST 20 02/06/2024 1232   AST 42 07/12/2012 0755   ALT 15 02/06/2024 1232   ALKPHOS 44 02/06/2024 1232   ALKPHOS 32 07/12/2012 0755   BILITOT 0.7 02/06/2024 1232   BILITOT 0.6 02/01/2023 1411   BILITOT 1.0 07/12/2012 0755   GFRNONAA 56 (L) 02/06/2024 1232   GFRNONAA 36 (L) 07/22/2020 1441   GFRAA 41 (L) 07/22/2020 1441     No results found for: "CEA1", "CEA" / No results found for: "CEA1", "CEA" No results found for: "PSA1" No results found for: "ZOX096" No results found for: "CAN125"  No results found for: "TOTALPROTELP", "ALBUMINELP", "A1GS", "A2GS", "BETS", "BETA2SER", "GAMS", "MSPIKE", "SPEI" Lab Results  Component Value Date   TIBC 329 12/12/2009   FERRITIN 455 (H) 12/12/2009   IRONPCTSAT 7 (L) 12/12/2009   No results found for: "LDH"   STUDIES:   EEG adult Result Date: 02/07/2024 Arleene Lack, MD     02/07/2024  4:44 PM Patient Name: JASLYNE BEECK MRN: [id number] Epilepsy  Attending: Arleene Lack Referring Physician/Provider: Lesa Rape, MD Date: 02/07/2024 Duration: 22.38 mins Patient history: 73yo F with syncope. EEG to evaluate for seizure Level of alertness: Awake AEDs during EEG study: None Technical aspects: This EEG study was done with scalp electrodes positioned according to the 10-20 International system of electrode placement. Electrical activity was reviewed with band pass filter of 1-70Hz , sensitivity of 7 uV/mm, display speed of 76mm/sec with a 60Hz  notched filter applied as appropriate. EEG data were recorded continuously and digitally stored.  Video monitoring was available and reviewed as appropriate. Description: The posterior dominant rhythm consists of 8Hz  activity of moderate voltage (25-35 uV) seen predominantly in posterior head regions, symmetric and reactive to eye opening and eye closing. Hyperventilation and photic stimulation were not performed.   IMPRESSION: This study is within normal limits. No seizures or epileptiform discharges were seen throughout the recording. A normal interictal EEG does not exclude the diagnosis of epilepsy. Arleene Lack   MR BRAIN WO CONTRAST Result Date: 02/06/2024 CLINICAL DATA:  Mental status changes. Fall with loss of consciousness. The patient is an a stick to the event. EXAM: MRI HEAD WITHOUT CONTRAST TECHNIQUE: Multiplanar, multiecho pulse sequences of the brain and surrounding structures were obtained without intravenous contrast. COMPARISON:  CT head without contrast 02/06/2024. FINDINGS:  Brain: No acute infarct, hemorrhage, or mass lesion is present. A remote lacunar infarct is again noted in the posteroinferior right cerebellum. No significant white matter lesions are present. Deep brain nuclei are within normal limits. The ventricles are of normal size. No significant extraaxial fluid collection is present. Brainstem and cerebellum are otherwise within normal limits. The internal auditory canals are within  normal limits. Midline structures are within normal limits. Vascular: Flow is present in the major intracranial arteries. Skull and upper cervical spine: Degenerative changes are present the upper cervical spine. Craniocervical junction is normal. Marrow signal is normal. Sinuses/Orbits: The paranasal sinuses and mastoid air cells are clear. A remote right orbital blowout fracture is noted. Elongation of the left globe is again noted. IMPRESSION: 1. No acute intracranial abnormality or significant interval change. 2. 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 3. Remote right orbital blowout fracture. 4. Elongation of the left globe is again noted. Electronically Signed   By: Audree Leas M.D.   On: 02/06/2024 15:59   DG Elbow Complete Right Result Date: 02/06/2024 CLINICAL DATA:  Fall EXAM: RIGHT ELBOW - COMPLETE 3+ VIEW COMPARISON:  None Available. FINDINGS: There is no evidence of fracture, dislocation, or joint effusion. There is no evidence of arthropathy or other focal bone abnormality. Soft tissues are unremarkable. IMPRESSION: Negative. Electronically Signed   By: Tyron Gallon M.D.   On: 02/06/2024 15:14   DG Shoulder Right Result Date: 02/06/2024 CLINICAL DATA:  Fall and trauma to the right upper extremity. EXAM: RIGHT SHOULDER - 2+ VIEW COMPARISON:  None Available. FINDINGS: No acute fracture or dislocation. The bones are osteopenic. There is degenerative changes of the right AC joint and spurring. The soft tissues are unremarkable. IMPRESSION: 1. No acute fracture or dislocation. 2. Osteopenia and degenerative changes. Electronically Signed   By: Angus Bark M.D.   On: 02/06/2024 15:13   CT Head Wo Contrast Result Date: 02/06/2024 CLINICAL DATA:  Head trauma, minor (Age >= 65y) Mental status change, unknown cause EXAM: CT HEAD WITHOUT CONTRAST TECHNIQUE: Contiguous axial images were obtained from the base of the skull through the vertex without intravenous contrast.  RADIATION DOSE REDUCTION: This exam was performed according to the departmental dose-optimization program which includes automated exposure control, adjustment of the mA and/or kV according to patient size and/or use of iterative reconstruction technique. COMPARISON:  None Available. FINDINGS: Brain: Normal anatomic configuration. Parenchymal volume loss is commensurate with the patient's age. No abnormal intra or extra-axial mass lesion or fluid collection. No abnormal mass effect or midline shift. No evidence of acute intracranial hemorrhage or infarct. Ventricular size is normal. Cerebellum unremarkable. Vascular: No asymmetric hyperdense vasculature at the skull base. Skull: Intact Sinuses/Orbits: Paranasal sinuses are clear. Remote right medial orbital wall fracture. Orbits are otherwise unremarkable. Other: Mastoid air cells and middle ear cavities are clear. IMPRESSION: 1. No acute intracranial abnormality. 2. Remote right medial orbital wall fracture. Electronically Signed   By: Worthy Heads M.D.   On: 02/06/2024 12:46   DG Chest Portable 1 View Result Date: 02/06/2024 CLINICAL DATA:  syncope EXAM: PORTABLE CHEST - 1 VIEW COMPARISON:  12/18/2023 FINDINGS: Lungs are clear. Heart size and mediastinal contours are within normal limits. Aortic Atherosclerosis (ICD10-170.0). No effusion. Visualized bones unremarkable. IMPRESSION: No acute cardiopulmonary disease. Electronically Signed   By: Nicoletta Barrier M.D.   On: 02/06/2024 12:19

## 2024-02-22 NOTE — Patient Instructions (Signed)
 [facility] at [facility] Discharge Instructions   You were seen and examined today by Dr. Ellin Saba.  He reviewed the results of your lab work which are normal/stable.   We will see you back in 6 months. We will repeat lab work prior to this visit.   Return as scheduled.    Thank you for choosing [facility] at [facility] to provide your oncology and hematology care.  To afford each patient quality time with our provider, please arrive at least 15 minutes before your scheduled appointment time.   If you have a lab appointment with the Cancer Center please come in thru the Main Entrance and check in at the main information desk.  You need to re-schedule your appointment should you arrive 10 or more minutes late.  We strive to give you quality time with our providers, and arriving late affects you and other patients whose appointments are after yours.  Also, if you no show three or more times for appointments you may be dismissed from the clinic at the providers discretion.     Again, thank you for choosing [facility].  Our hope is that these requests will decrease the amount of time that you wait before being seen by our physicians.       _____________________________________________________________  Should you have questions after your visit to [facility], please contact our office at [phone] and follow the prompts.  Our office hours are 8:00 a.m. and 4:30 p.m. Monday - Friday.  Please note that voicemails left after 4:00 p.m. may not be returned until the following business day.  We are closed weekends and major holidays.  You do have access to a nurse 24-7, just call the main number to the clinic [phone] and do not press any options, hold on the line and a nurse will answer the phone.    For prescription refill requests, have your pharmacy contact our office and allow 72 hours.    Due to Covid, you will  need to wear a mask upon entering the hospital. If you do not have a mask, a mask will be given to you at the Main Entrance upon arrival. For doctor visits, patients may have 1 support person age 67 or older with them. For treatment visits, patients can not have anyone with them due to social distancing guidelines and our immunocompromised population.

## 2024-03-02 ENCOUNTER — Encounter: Payer: Self-pay | Admitting: Internal Medicine

## 2024-03-02 ENCOUNTER — Ambulatory Visit (INDEPENDENT_AMBULATORY_CARE_PROVIDER_SITE_OTHER): Payer: Medicare Other | Admitting: Internal Medicine

## 2024-03-02 VITALS — BP 140/78 | HR 72 | Ht 67.0 in | Wt 199.0 lb

## 2024-03-02 DIAGNOSIS — K760 Fatty (change of) liver, not elsewhere classified: Secondary | ICD-10-CM | POA: Diagnosis not present

## 2024-03-02 DIAGNOSIS — Z8619 Personal history of other infectious and parasitic diseases: Secondary | ICD-10-CM | POA: Diagnosis not present

## 2024-03-02 DIAGNOSIS — Z8719 Personal history of other diseases of the digestive system: Secondary | ICD-10-CM

## 2024-03-02 DIAGNOSIS — K219 Gastro-esophageal reflux disease without esophagitis: Secondary | ICD-10-CM | POA: Diagnosis not present

## 2024-03-02 DIAGNOSIS — K5900 Constipation, unspecified: Secondary | ICD-10-CM

## 2024-03-02 MED ORDER — DEXLANSOPRAZOLE 60 MG PO CPDR: 60.0000 mg | DELAYED_RELEASE_CAPSULE | Freq: Every day | ORAL | 3 refills | Status: DC

## 2024-03-02 NOTE — Patient Instructions (Signed)
 We have sent the following medications to your pharmacy for you to pick up at your convenience: Dexilant   Follow up in 6 months  If your blood pressure at your visit was 140/90 or greater, please contact your primary care physician to follow up on this.  _______________________________________________________  If you are age 71 or older, your body mass index should be between 23-30. Your Body mass index is 31.17 kg/m. If this is out of the aforementioned range listed, please consider follow up with your Primary Care Provider.  If you are age 63 or younger, your body mass index should be between 19-25. Your Body mass index is 31.17 kg/m. If this is out of the aformentioned range listed, please consider follow up with your Primary Care Provider.   ________________________________________________________  The [facility] providers would like to encourage you to use MYCHART to communicate with providers for non-urgent requests or questions.  Due to long hold times on the telephone, sending your provider a message by [facility] may be a faster and more efficient way to get a response.  Please allow 48 business hours for a response.  Please remember that this is for non-urgent requests.  _______________________________________________________   Thank you for entrusting me with your care and for choosing [facility], Dr. Regino Caprio

## 2024-03-02 NOTE — Progress Notes (Signed)
 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 She passed out a few times recently that may have been related to Xanax  usage. She has had more anxiety recently.  She has been having some blood in the stools. On Saturday she had a BM and she saw some blood after she strained. The blood was bright red. She also saw droplets of blood in the toilet. Denies rectal pain. She has lost weight over the last several months. She has not been more constipated. Denies dysphagia. She is using Dexilant  daily to help with GERD. GERD is under good control. She uses magnesium  supplements to help control her bowel habits.  Wt Readings from Last 3 Encounters:  03/02/24 199 lb (90.3 kg)  02/22/24 199 lb 1.2 oz (90.3 kg)  02/17/24 198 lb (89.8 kg)   Current Outpatient Medications  Medication Sig Dispense Refill   albuterol  (PROVENTIL ) (2.5 MG/3ML) 0.083% nebulizer solution Take 2.5 mg by nebulization every 6 (six) hours as needed for wheezing or shortness of breath.     albuterol  (VENTOLIN  HFA) 108 (90 Base) MCG/ACT inhaler Inhale into the lungs every 6 (six) hours as needed for wheezing or shortness of breath.     ALPRAZolam  (XANAX ) 1 MG tablet Take 1 mg by mouth at bedtime as needed for anxiety. Takes 1 a day if needed     apixaban  (ELIQUIS ) 5 MG TABS tablet Take 1 tablet (5 mg total) by mouth 2 (two) times daily. 180 tablet 3   brimonidine  (ALPHAGAN ) 0.2 % ophthalmic solution Place 1 drop into both eyes 2 (two) times daily. (Patient taking differently: Place 1 drop into both eyes at bedtime.) 15 mL 3   chlorpheniramine-HYDROcodone  (TUSSIONEX) 10-8  MG/5ML Take 5 mLs by mouth.     Cholecalciferol  (VITAMIN D3) 50 MCG (2000 UT) TABS Take 2,000 Units by mouth daily.     dexlansoprazole  (DEXILANT ) 60 MG capsule Take 1 capsule (60 mg total) by mouth daily. 90 capsule 3   diltiazem 2 % GEL Apply 1 Application topically 2 (two) times daily.     estradiol  (ESTRACE ) 0.1 MG/GM vaginal cream Place 1 Applicatorful vaginally at bedtime.     fenofibrate  (TRICOR ) 145 MG tablet Take 1 tablet (145 mg total) by mouth daily. 90 tablet 3   HYDROcodone -acetaminophen  (NORCO/VICODIN) 5-325 MG tablet Take 1 tablet by mouth every 6 (six) hours as needed for moderate pain (pain score 4-6). Got it from ortho [address] with cone     irbesartan  (AVAPRO ) 150 MG tablet Take 150 mg by mouth daily. Takes 75mg  in morning     latanoprost  (XALATAN ) 0.005 % ophthalmic solution Place 1 drop into both eyes at bedtime. 9 mL 3   lidocaine  (XYLOCAINE ) 5 % ointment Apply 1 Application topically as needed. 35.44 g 0   Magnesium  500 MG TABS Take 1 tablet by mouth in the morning and at bedtime. Taking 1 in he morning, afternoon, and at bedtime     meclizine  (ANTIVERT ) 12.5 MG tablet Take 12.5 mg by mouth 3 (three) times daily as needed for dizziness.     metroNIDAZOLE  (METROCREAM ) 0.75 % cream Apply topically 2 (two)  times daily.     mirtazapine  (REMERON ) 7.5 MG tablet Take 1 tablet (7.5 mg total) by mouth at bedtime. 90 tablet 0   nitroGLYCERIN  (NITROLINGUAL ) 0.4 MG/SPRAY spray Place 1 spray under the tongue every 5 (five) minutes as needed for chest pain. 12 g 3   Nitroglycerin  0.4 % OINT Place rectally.     ondansetron  (ZOFRAN ) 4 MG tablet TAKE ONE TABLET BY MOUTH EVERY 8 HOURS AS NEEDED FOR NAUSEA OR VOMITING 30 tablet 1   Probiotic Product (PROBIOTIC DAILY PO) Take 1 capsule by mouth daily. VSL #3     ranolazine  (RANEXA ) 500 MG 12 hr tablet Take 1 tablet (500 mg total) by mouth 2 (two) times daily. 180 tablet 3   rizatriptan  (MAXALT -MLT) 10 MG disintegrating tablet DISSOLVE  ONE TABLET ON TONGUE AS NEEDED FOR MIGRAINE. MAY REPEAT IN 2 HOURS IF NEEDED (Patient taking differently: Take 10 mg by mouth as needed for migraine.) 9 tablet 6   XIIDRA  5 % SOLN Apply 1 drop to eye at bedtime.     zolpidem  (AMBIEN ) 10 MG tablet Take 1 tablet (10 mg total) by mouth at bedtime as needed for sleep. 90 tablet 1   amoxicillin  (AMOXIL ) 500 MG capsule Take 4 capsules at once by mouth 1 hour prior to the dental procedure. (Patient not taking: Reported on 03/02/2024) 4 capsule 0   No current facility-administered medications for this visit.   Physical Exam: BP (!) 140/78   Pulse 72   Ht 5\' 7"  (1.702 m)   Wt 199 lb (90.3 kg)   BMI 31.17 kg/m  Constitutional: Pleasant,well-developed, female in no acute distress. HEENT: Normocephalic and atraumatic. Conjunctivae are normal. No scleral icterus. Cardiovascular: Normal rate, regular rhythm.  Pulmonary/chest: Effort normal and breath sounds normal. No wheezing, rales or rhonchi. Abdominal: Soft, nondistended, nontender. Bowel sounds active throughout. There are no masses palpable. No hepatomegaly. Extremities: No edema Neurological: Alert and oriented to person place and time. Skin: Skin is warm and dry. No rashes noted. Psychiatric: Normal mood and affect. Behavior is normal.  Labs 07/2012: TTG IGA negative.  Labs 09/2021: C dif toxin positive on GI profile. TSH nml. CMP with mildly elevated Cr of 1.17.   Labs 02/2022: GI profile positive for norovirus. C dif negative. O&P negative.  Labs 04/2022: CMP with elevated Cr of 1.1, elevated AST of 65, elevated T bili of 1.4. TSH and FT4 nml.   Labs 09/2022: CBC nml. CRP nml. ESR nml.   Labs 10/2022: CMP with mildly low sodium of 134, elevated Cr of 1.24. and elevated AST of 44. TSH nml. Free T4 mildly elevated at 1.19.   Labs 01/2023: CMP with mildly elevated Cr of 1.24. HbA1C 5.6%.  Labs 04/2023: TSH nml. Free T4 nml. Magnesium  low at 1.5.   Labs 02/2024: CBC with low Hb of 11.9. CMP  with mildly elevated Cr of 1.07.   CT A/P w/contrast 12/30/20: IMPRESSION: 2 cm benign cyst in lower pole of right kidney, corresponding to the lesion seen on recent MRI. No evidence of renal neoplasm, urolithiasis, or hydronephrosis.  Moderate hepatic steatosis.  Aortic Atherosclerosis (ICD10-I70.0).  Ab U/S with elastography 09/09/22: IMPRESSION: ULTRASOUND RUQ: Echogenic liver.  Postcholecystectomy. ULTRASOUND HEPATIC ELASTOGRAPHY: Median kPa:  7.4 Diagnostic category: < or = 9 kPa: in the absence of other known clinical signs, rules out cACLD  SBE 03/14/13: Mild gastritis.  Flex sig 03/18/18: - Anal fissure found on perianal exam. - Hemorrhoids found on perianal exam. - The examination was otherwise  normal.  Colonoscopy 04/21/21: - Anal fissure found on perianal exam. - Non-bleeding internal hemorrhoids. - Diverticulosis in the right colon. - One 2 mm polyp in the ascending colon, removed with a cold biopsy forceps. Resected and retrieved. - Three 4 to 6 mm polyps in the descending colon, removed with a cold snare. Resected and retrieved. Path: A. COLON, ASCENDING, POLYPECTOMY:  - Tubular adenoma.  - No high-grade dysplasia or carcinoma.  B. COLON, DESCENDING, POLYPECTOMY:  - Tubular adenoma (1).  - No high-grade dysplasia or carcinoma.  - Hyperplastic polyp (1).  EGD 02/25/22: - Normal esophagus. Biopsied. - Gastritis. Biopsied. - Two duodenal polyps. Resected and retrieved. Path: 1. Surgical [P], small bowel polyps - DUODENAL MUCOSA WITH PEPTIC INJURY AND BRUNNER GLAND HYPERPLASIA. - NO DYSPLASIA OR MALIGNANCY. 2. Surgical [P], gastric - ANTRAL AND OXYNTIC MUCOSA WITH HYPEREMIA. - NO HELICOBACTER PYLORI IDENTIFIED. 3. Surgical [P], esophageal - UNREMARKABLE SQUAMOUS MUCOSA. - NO EOSINOPHILIC ESOPHAGITIS.  ASSESSMENT AND PLAN: GERD Dysphagia - resolved Hepatic steatosis Elevated LFTs - resolved History of cholecystectomy History of diarrhea and fecal  incontinence, improved with pelvic floor PT and low FODMAP diet. Now occasional constipation History of C dif  Patient has been doing fairly well from a GI standpoint. Her bowel habits are normal and her reflux symptoms have been under good control. She has still been recovering from her PE from 10/2023 and has had a couple of episodes of syncope for which she has been evaluated by cardiology and has been sparingly using her Xanax . Likely related to her recent hospitalizations, she has had unintentional weight loss. Patient did see some rectal bleeding recently and has history of internal hemorrhoids as well as an anal fissure s/p sphincterotomy. She declined a rectal exam today. I suspect that her rectal bleeding is most likely due to hemorrhoids since patient admits to straining prior to onset of the rectal bleeding. She was not interested in any rectal creams at this time. Her last colonoscopy was in 2022 that looked appropriate, and she is not interested in repeating a colonoscopy at this time.  - Continue low FODMAP diet - Continue Dexilant . Refill. - Previously completed pelvic floor PT - Declined rectal exam and Anusol  HC cream BID - Consider stool softener - Repeat colonoscopy for polyp surveillance due in 04/2026 - RTC 6 months  Regino Caprio, MD  I spent 31 minutes of time, including in depth chart review, independent review of results as outlined above, communicating results with the patient directly, face-to-face time with the patient, coordinating care, ordering studies and medications as appropriate, and documentation.

## 2024-03-22 ENCOUNTER — Ambulatory Visit: Admitting: Orthopaedic Surgery

## 2024-04-04 ENCOUNTER — Telehealth: Payer: Self-pay | Admitting: Internal Medicine

## 2024-04-04 DIAGNOSIS — K219 Gastro-esophageal reflux disease without esophagitis: Secondary | ICD-10-CM

## 2024-04-04 MED ORDER — FENOFIBRATE 145 MG PO TABS: 145.0000 mg | ORAL_TABLET | Freq: Every day | ORAL | 3 refills | Status: DC

## 2024-04-04 MED ORDER — DEXLANSOPRAZOLE 60 MG PO CPDR: 60.0000 mg | DELAYED_RELEASE_CAPSULE | Freq: Every day | ORAL | 3 refills | Status: DC

## 2024-04-04 NOTE — Telephone Encounter (Signed)
 Copied from CRM [id number]. Topic: Clinical - Medication Refill >> Apr 04, 2024  9:56 AM Avram MATSU wrote: Medication: dexlansoprazole  (DEXILANT ) 60 MG capsule [[id number]] fenofibrate  (TRICOR ) 145 MG tablet [[id number]]  Has the patient contacted their pharmacy? Yes (Agent: If no, request that the patient contact the pharmacy for the refill. If patient does not wish to contact the pharmacy document the reason why and proceed with request.) (Agent: If yes, when and what did the pharmacy advise?)  This is the patient's preferred pharmacy:  [facility] - [place], [place] - [address] [facility] [address] [place] [address] [place] [address] Phone: [phone] Fax: [phone]  Is this the correct pharmacy for this prescription? Yes If no, delete pharmacy and type the correct one.   Has the prescription been filled recently? No  Is the patient out of the medication? No  Has the patient been seen for an appointment in the last year OR does the patient have an upcoming appointment? Yes  Can we respond through MyChart? Yes  Agent: Please be advised that Rx refills may take up to 3 business days. We ask that you follow-up with your pharmacy.

## 2024-04-05 ENCOUNTER — Encounter: Payer: Self-pay | Admitting: Orthopaedic Surgery

## 2024-04-05 ENCOUNTER — Telehealth: Payer: Self-pay | Admitting: *Deleted

## 2024-04-05 ENCOUNTER — Telehealth: Payer: Self-pay | Admitting: Internal Medicine

## 2024-04-05 ENCOUNTER — Ambulatory Visit: Admitting: Orthopaedic Surgery

## 2024-04-05 DIAGNOSIS — M7052 Other bursitis of knee, left knee: Secondary | ICD-10-CM | POA: Diagnosis not present

## 2024-04-05 MED ORDER — HYDROCODONE-ACETAMINOPHEN 5-325 MG PO TABS: ORAL_TABLET | ORAL | 0 refills | Status: DC

## 2024-04-05 NOTE — Telephone Encounter (Unsigned)
 Copied from CRM [id number]. Topic: General - Transportation >> Apr 05, 2024  5:04 PM Donee H wrote: Reason for CRM: Patient called to schedule a visit to see Dr. Tobie on  Tuesday, April 11, 2024 at 1pm. Patient need transportation to get to appointment and need to arrive 15 mins prior to appointment time.  Patient would like callback to confirm transportation  was set up. [phone]

## 2024-04-05 NOTE — Telephone Encounter (Signed)
 Pt wants to know if you can call in Hydrocodone  to [facility]. States talked about so much when in office can remember if you were going to cal this in.

## 2024-04-05 NOTE — Progress Notes (Addendum)
 My knee hurts.  She has pain just below the left knee joint more medially with some swelling at times.  She has no redness.  She has no numbness or trauma.  She has long standing swelling of the knee joint but this is below the joint line and the knee itself is not hurting.  She has swelling and some tenderness of the pes anserinus area of the left knee.  ROM of the knee is 0 to 110, stable, effusion and crepitus present.  NV intact.  Encounter Diagnosis  Name Primary?   Pes anserinus bursitis of left knee Yes   She declines injection.  I have told her to use Biofreeze to the area and mild heat.  She has some lower back pain but she has been moving a lot of items recently.  I think she overdid it.  Return prn.  Call if any problem.  Precautions discussed.  Electronically Signed Lemond Stable, MD 7/2/20252:25 PM  Addend   She requested pain medicine.  Done.  I have reviewed the [place]  Controlled Substance Reporting System web site prior to prescribing narcotic medicine for this patient.  Electronically Signed Lemond Stable, MD 7/2/20254:48 PM

## 2024-04-05 NOTE — Addendum Note (Signed)
 Addended by: BRENNA NORLEEN ORN on: 04/05/2024 04:48 PM   Modules accepted: Orders

## 2024-04-06 ENCOUNTER — Telehealth: Payer: Self-pay | Admitting: Licensed Clinical Social Worker

## 2024-04-06 NOTE — Telephone Encounter (Signed)
 H&V Care Navigation CSW Progress Note  Clinical Social Worker contacted patient by phone to f/u on challenges with transportation. I was contacted by [place] with [employer] who has assisted pt with transportation to oncology. When he contacted pt she mentioned she needs a ride to upcoming [facility] appt. I noted one scheduled ride arranged by Lonell, former team lead in March. Otherwise I see no ride notes, but per [place] pt was adamant Les[facility] has been covering her rides to [facility] clinic.   I was able to reach pt at [phone]. Introduced self, role, reason for call. Pt confirmed home address, PCP, and resides alone. 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 She shares that Schneider at the office always arranges the rides. I shared that I need to f/u with [facility] regarding assistance and approval on cost center with team lead there. Inquired if she has reached out to [facility], she shares they cannot help with door to door guidance due to liability so [facility] is the only way she can get to appts. I shared there may be a time that we are unable to pay for rides as a clinic due to ongoing costs and we will need to try and connect her to alternate resources.   Otherwise pt shares she navigates around by herself, is visited by friends in the area, her adult son lives in [place]  and she declines to move closer to him due to costs and that he may eventually move out of the country. I inquired if she and her son or friends have ever discussed a more supportive living environment given her vision needs and various medical concerns. Pt states she hasn't really discussed it, assumes she will need to go somewhere at some point. At this point does not have Medicaid, I am curious if she would qualify given visual impairements. She relies on [employer] and [place] death  benefits from spouse. Encouraged her to consider having these conversations with family/friends and I'd be happy to send any additional resources for this process.  Shared I would reach out about transportation request to clinic and be in touch today or Tuesday, as I am off Monday.   Message sent to [facility], patient access and [place]on, Research officer, political party for next steps for pt. Will need verbal waiver and signed waiver completed as well.  Patient is participating in a Managed Medicaid Plan:  No, Medicare and [employer]  SDOH Screenings   Food Insecurity: No Food Insecurity (02/06/2024)  Housing: Low Risk  (02/06/2024)  Recent Concern: Housing - High Risk (02/06/2024)  Transportation Needs: No Transportation Needs (02/06/2024)  Recent Concern: Transportation Needs - Unmet Transportation Needs (12/30/2023)  Utilities: Not At Risk (02/06/2024)  Alcohol Screen: Low Risk  (06/11/2023)  Depression (PHQ2-9): Low Risk  (02/17/2024)  Recent Concern: Depression (PHQ2-9) - Medium Risk (02/02/2024)  Financial Resource Strain: Low Risk  (06/11/2023)  Physical Activity: Sufficiently Active (06/11/2023)  Social Connections: Socially Isolated (02/06/2024)  Stress: No Stress Concern Present (06/11/2023)  Tobacco Use: Medium Risk (04/05/2024)  Health Literacy: Adequate Health Literacy (06/11/2023)    Marit Lark, MSW, LCSW Clinical Social Worker II [facility] Health Heart/Vascular Care Navigation  [phone]- work cell phone (preferred)

## 2024-04-06 NOTE — Telephone Encounter (Signed)
 Emailed sent to [facility] transportation, called patient confirmed

## 2024-04-10 ENCOUNTER — Telehealth: Payer: Self-pay | Admitting: Licensed Clinical Social Worker

## 2024-04-10 NOTE — Telephone Encounter (Signed)
 H&V Care Navigation CSW Progress Note  Clinical Social Worker sent ride request to clinic lead Levon, cost of ride is about $150 with [facility] and want to ensure this is okay with administration before arranging ride and gather guidance for ability to cover this cost moving forward.  Patient is participating in a Managed Medicaid Plan:  No, Medicare and [employer]  SDOH Screenings   Food Insecurity: No Food Insecurity (02/06/2024)  Housing: Low Risk  (02/06/2024)  Recent Concern: Housing - High Risk (02/06/2024)  Transportation Needs: No Transportation Needs (02/06/2024)  Recent Concern: Transportation Needs - Unmet Transportation Needs (12/30/2023)  Utilities: Not At Risk (02/06/2024)  Alcohol Screen: Low Risk  (06/11/2023)  Depression (PHQ2-9): Low Risk  (02/17/2024)  Recent Concern: Depression (PHQ2-9) - Medium Risk (02/02/2024)  Financial Resource Strain: Low Risk  (06/11/2023)  Physical Activity: Sufficiently Active (06/11/2023)  Social Connections: Socially Isolated (02/06/2024)  Stress: No Stress Concern Present (06/11/2023)  Tobacco Use: Medium Risk (04/05/2024)  Health Literacy: Adequate Health Literacy (06/11/2023)    Marit Lark, MSW, LCSW Clinical Social Worker II [facility] Health Heart/Vascular Care Navigation  [phone]- work cell phone (preferred)

## 2024-04-11 ENCOUNTER — Telehealth (HOSPITAL_BASED_OUTPATIENT_CLINIC_OR_DEPARTMENT_OTHER): Payer: Self-pay | Admitting: Licensed Clinical Social Worker

## 2024-04-11 ENCOUNTER — Ambulatory Visit (INDEPENDENT_AMBULATORY_CARE_PROVIDER_SITE_OTHER): Payer: Self-pay | Admitting: Internal Medicine

## 2024-04-11 ENCOUNTER — Encounter: Payer: Self-pay | Admitting: Internal Medicine

## 2024-04-11 VITALS — BP 110/71 | HR 92 | Ht 67.0 in | Wt 200.4 lb

## 2024-04-11 DIAGNOSIS — F419 Anxiety disorder, unspecified: Secondary | ICD-10-CM | POA: Diagnosis not present

## 2024-04-11 DIAGNOSIS — I2699 Other pulmonary embolism without acute cor pulmonale: Secondary | ICD-10-CM

## 2024-04-11 DIAGNOSIS — M51372 Other intervertebral disc degeneration, lumbosacral region with discogenic back pain and lower extremity pain: Secondary | ICD-10-CM | POA: Diagnosis not present

## 2024-04-11 DIAGNOSIS — N3000 Acute cystitis without hematuria: Secondary | ICD-10-CM

## 2024-04-11 DIAGNOSIS — R109 Unspecified abdominal pain: Secondary | ICD-10-CM | POA: Diagnosis not present

## 2024-04-11 MED ORDER — PREDNISONE 20 MG PO TABS: 40.0000 mg | ORAL_TABLET | Freq: Every day | ORAL | 0 refills | Status: DC

## 2024-04-11 MED ORDER — NITROFURANTOIN MONOHYD MACRO 100 MG PO CAPS: 100.0000 mg | ORAL_CAPSULE | Freq: Two times a day (BID) | ORAL | 0 refills | Status: DC

## 2024-04-11 NOTE — Assessment & Plan Note (Signed)
 H/o panic disorder as well Well-controlled with Xanax  Xanax  1 mg PRN, takes it only for panic episodes Although it has been reported that her fall was related to Xanax , it is less likely as she had orthostatic symptoms and dehydration during that episode

## 2024-04-11 NOTE — Assessment & Plan Note (Signed)
 UA reviewed Started empiric Macrobid  Maintain adequate hydration Her back pain is less likely related to UTI

## 2024-04-11 NOTE — Assessment & Plan Note (Addendum)
 Continue Eliquis  5 mg twice daily - chills may be related to Eliquis  Followed by heme./Onc. 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

## 2024-04-11 NOTE — Telephone Encounter (Signed)
 H&V Care Navigation CSW Progress Note  Clinical Social Worker contacted patient by phone to f/u after coordinating ride scheduling with [employer], Research officer, political party for [place], with [facility] scheduling team. Was able to reach her at [phone]. I shared that we will be able to schedule a ride for her upcoming appt with Dr. Debera but moving forward rides would be scheduled on a case by case basis and we encouraged her to reach out to alternative transportation providers in the [place] area for door to door assistance. Pt agreeable to these being sent to her via MyChart and I will send the waiver through this way as well. She shares that her computer is set up so that she can enlarge it to see. No additional questions-thanked her for her patience while we f/u on her request.  Patient is participating in a Managed Medicaid Plan:  No Medicare and [employer]  SDOH Screenings   Food Insecurity: No Food Insecurity (02/06/2024)  Housing: Low Risk  (02/06/2024)  Recent Concern: Housing - High Risk (02/06/2024)  Transportation Needs: No Transportation Needs (02/06/2024)  Recent Concern: Transportation Needs - Unmet Transportation Needs (12/30/2023)  Utilities: Not At Risk (02/06/2024)  Alcohol Screen: Low Risk  (06/11/2023)  Depression (PHQ2-9): Low Risk  (02/17/2024)  Recent Concern: Depression (PHQ2-9) - Medium Risk (02/02/2024)  Financial Resource Strain: Low Risk  (06/11/2023)  Physical Activity: Sufficiently Active (06/11/2023)  Social Connections: Socially Isolated (02/06/2024)  Stress: No Stress Concern Present (06/11/2023)  Tobacco Use: Medium Risk (04/05/2024)  Health Literacy: Adequate Health Literacy (06/11/2023)    [name], MSW, LCSW Clinical Social Worker II [facility] Heart/Vascular Care Navigation  [phone]- work cell phone (preferred)

## 2024-04-11 NOTE — Assessment & Plan Note (Addendum)
 Avoid heavy lifting and frequent bending Advised to use back brace and/or heating pad as needed Has been evaluated by orthopedic surgeon - Norco as needed for severe pain Oral Prednisone  40 mg once daily X 5 days

## 2024-04-11 NOTE — Patient Instructions (Addendum)
 Please start taking Macrobid  as prescribed.  Please take oral Prednisone  as prescribed.  Please apply heating pad and/or use back brace as needed for back pain.

## 2024-04-11 NOTE — Progress Notes (Signed)
 Acute Office Visit  Subjective:    Patient ID: [name], female    DOB: Apr 22, 1951, 73 y.o.   MRN: [id number]  Chief Complaint  Patient presents with   Flank Pain    Pt reports side pain, and fevers ongoing for 1 week.     HPI Patient is in today for complaint of right-sided low back pain radiating towards the flank area for the last 1 week.  Pain is constant, dull, worse with movement and better with Norco.  She was recently evaluated by orthopedic surgeon for acute on chronic low back pain.  Denies any recent injury.  She also reports fever in the last week, 100.4 F at home with mild chills.  Denies any recent worsening of cough, nasal congestion or sinus pressure.  She is concerned about UTI.  Denies dysuria, hematuria or urinary hesitancy or resistance currently.  She was recently evaluated by hematology oncology for recurrent PE, G6PD deficiency and sickle cell trait.  She has been advised to take Eliquis  indefinitely.  Past Medical History:  Diagnosis Date   Anal fissure    Anxiety    Arthritis    Asthma    C. difficile diarrhea 09/17/2021   Cataract    Phreesia 01/05/2021   Chest pain    a. 02/2000 Cath: nl cors, EF 60%;  b. 10/2010 MV: nl LV, no ischemia/infarct;  c. 03/2014 Admit c/p, r/o->grief from husbands death.   Chronic RUQ pain April 22, 2008   EUS slightly dilated CBD (7.28mm), otherwise nl   Depression    Diverticulosis    Eczema    Exposure to chemical inhalation mid 1970s   Fatty liver    G6PD deficiency    Gallstones    GERD (gastroesophageal reflux disease)    Glaucoma    History of pulmonary embolus (PE)    Treated with Eliquis  April 23, 2015   HTN (hypertension)    Hypercholesteremia    a. intolerant to statins.   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 She has limited vision in the right eye.   Migraines    inner ocular   Ocular migraine    Palpitations     Pleurisy    Pneumonia 07/16/2021   Patient was exposed to a harsh chemical in the 1970's that created scar tissue in her lungs. She has had pneumonia several times in the past.   PONV (postoperative nausea and vomiting)    Pulmonary emboli (HCC)    Recurrent upper respiratory infection (URI)    Renal cyst    Retinal cyst    Sickle cell trait (HCC)    Tubular adenoma of colon 09/17/2011   Urticaria     Past Surgical History:  Procedure Laterality Date   ABDOMINAL HYSTERECTOMY     ADENOIDECTOMY     ANGIOPLASTY     APPENDECTOMY     BIOPSY N/A 03/14/2013   Procedure: SMALL BOWEL AND GASTRIC BIOPSIES (Procedure #1);  Surgeon: Margo LITTIE Haddock, MD;  Location: [facility];  Service: Endoscopy;  Laterality: N/A;   CARDIAC CATHETERIZATION Left 02/11/2000   CATARACT EXTRACTION Left    CATARACT EXTRACTION W/PHACO Right 03/19/2021   Procedure: CATARACT EXTRACTION PHACO AND INTRAOCULAR LENS PLACEMENT (IOC) RIGHT 6.75 00:50.4;  Surgeon: Mittie Gaskin, MD;  Location: [facility];  Service: Ophthalmology;  Laterality: Right;   CHOLECYSTECTOMY  12/2007   COLONOSCOPY  12/22/2010  MFM:[id number], cecal adenomatous polyp   COLONOSCOPY WITH PROPOFOL  N/A 03/31/2016   Dr. Harvey: four sessile polyps rectum/sigmoid colon, diverticulosi, ext/int hemorrhoids. hyperplastic polyps, next tcs 5 years.   COLONOSCOPY WITH PROPOFOL  N/A 04/21/2021   anal fissure, non-bleeding internal hemorrhoids, right colon diverticulosis, one 2 mm polyp in ascending, three 4-6 mm polyps in descending colon. Tubular adenomas. 5 year surveillance.   complete hysterectomy     ENTEROSCOPY N/A 03/14/2013   DOQ:fpoi gastritis/ulcers has healed   ESOPHAGOGASTRODUODENOSCOPY  09/22/2011   DOQ:Fpoi gastritis/Duodenitis   EXCISIONAL HEMORRHOIDECTOMY     FLEXIBLE SIGMOIDOSCOPY N/A 03/14/2013   SLF:3 colon polyp removed/moderate sized internal hemorrhoids   FLEXIBLE SIGMOIDOSCOPY N/A 06/09/2016   Procedure: FLEXIBLE SIGMOIDOSCOPY;   Surgeon: Margo LITTIE Harvey, MD;  Location: [facility];  Service: Endoscopy;  Laterality: N/A;  rectal polyps times 2   FLEXIBLE SIGMOIDOSCOPY N/A 03/18/2018   Procedure: FLEXIBLE SIGMOIDOSCOPY;  Surgeon: Harvey Margo LITTIE, MD;  Location: [facility];  Service: Endoscopy;  Laterality: N/A;  9:15am   FOOT SURGERY     bunion removal left   GIVENS CAPSULE STUDY N/A 02/10/2013   Procedure: GIVENS CAPSULE STUDY;  Surgeon: Margo LITTIE Harvey, MD;  Location: [facility];  Service: Endoscopy;  Laterality: N/A;  730   HEEL SPUR EXCISION     right    HEMORRHOID BANDING N/A 03/14/2013   Procedure: HEMORRHOID BANDING (Procedure #3)  3 bands applied Onu#[id number] Exp 02/02/2014 ;  Surgeon: Margo LITTIE Harvey, MD;  Location: [facility];  Service: Endoscopy;  Laterality: N/A;   HEMORRHOID SURGERY N/A 04/24/2016   Procedure: EXTENSIVE HEMORRHOIDECTOMY;  Surgeon: Selinda Artist Moats, MD;  Location: [facility];  Service: General;  Laterality: N/A;   LAPAROSCOPY     adhesions   POLYPECTOMY N/A 03/14/2013   DOQ:FPOI Gastritis . ULCERS SEEN ON MAY 6 HAVE HEALED   POLYPECTOMY  03/31/2016   Procedure: POLYPECTOMY;  Surgeon: Margo LITTIE Harvey, MD;  [facility]: [facility];  Service: Endoscopy;;  sigmoid colon polyps x2, rectal polyps x2   POLYPECTOMY  04/21/2021   Procedure: POLYPECTOMY;  Surgeon: Cindie Carlin POUR, DO;  Location: [facility];  Service: Endoscopy;;   RECTAL EXAM UNDER ANESTHESIA N/A 07/23/2021   Procedure: RECTAL EXAM UNDER ANESTHESIA;  Surgeon: Debby Hila, MD;  Location: [facility];  Service: General;  Laterality: N/A;   SPHINCTEROTOMY N/A 07/23/2021   Procedure: CHEMICAL SPHINCTEROTOMY;  Surgeon: Debby Hila, MD;  Location: [facility];  Service: General;  Laterality: N/A;   TONSILLECTOMY     TUBAL LIGATION      Family History  Problem Relation Age of Onset   Colon cancer Mother        50s   Urticaria Mother    Heart disease Mother    Colon polyps Mother    Cancer  Father        oral   Crohn's disease Sister    Cancer Sister        around heart   Colon polyps Sister    Diabetes Brother    Other Brother        intestinal sugery   Colon polyps Brother    Colon cancer Maternal Grandmother    Colon cancer Maternal Grandfather    Colon cancer Paternal Grandfather    Anesthesia problems Neg Hx     Social History   Socioeconomic History   Marital status: Widowed    Spouse name: Not on file   Number of children:  1   Years of education: Not on file   Highest education level: Not on file  Occupational History   Occupation: homemaker    Employer: UNEMPLOYED  Tobacco Use   Smoking status: Former    Current packs/day: 0.00    Average packs/day: 0.5 packs/day for 30.0 years (15.0 ttl pk-yrs)    Types: Cigarettes    Start date: 10/15/1966    Quit date: 10/15/1996    Years since quitting: 27.5   Smokeless tobacco: Never   Tobacco comments:    Stopped smoking ~ 2000  Vaping Use   Vaping status: Never Used  Substance and Sexual Activity   Alcohol use: Not Currently    Alcohol/week: 4.0 standard drinks of alcohol    Types: 4 Glasses of wine per week    Comment: 1 drinks every couple of weeks   Drug use: No   Sexual activity: Not Currently    Birth control/protection: Surgical  Other Topics Concern   Not on file  Social History Narrative   Does not routinely exercise. Husband passed in JUN 2015 due to prostate ca.   Social Drivers of Corporate investment banker Strain: Low Risk  (06/11/2023)   Overall Financial Resource Strain (CARDIA)    Difficulty of Paying Living Expenses: Not hard at all  Food Insecurity: No Food Insecurity (02/06/2024)   Hunger Vital Sign    Worried About Running Out of Food in the Last Year: Never true    Ran Out of Food in the Last Year: Never true  Transportation Needs: Unmet Transportation Needs (04/11/2024)   PRAPARE - Transportation    Lack of Transportation (Medical): Yes    Lack of Transportation (Non-Medical):  Yes  Physical Activity: Sufficiently Active (06/11/2023)   Exercise Vital Sign    Days of Exercise per Week: 6 days    Minutes of Exercise per Session: 30 min  Stress: No Stress Concern Present (06/11/2023)   [employer] of Occupational Health - Occupational Stress Questionnaire    Feeling of Stress : Not at all  Social Connections: Socially Isolated (02/06/2024)   Social Connection and Isolation Panel    Frequency of Communication with Friends and Family: Three times a week    Frequency of Social Gatherings with Friends and Family: Once a week    Attends Religious Services: Never    Database administrator or Organizations: No    Attends Banker Meetings: Never    Marital Status: Widowed  Intimate Partner Violence: Not At Risk (02/06/2024)   Humiliation, Afraid, Rape, and Kick questionnaire    Fear of Current or Ex-Partner: No    Emotionally Abused: No    Physically Abused: No    Sexually Abused: No    Outpatient Medications Prior to Visit  Medication Sig Dispense Refill   albuterol  (PROVENTIL ) (2.5 MG/3ML) 0.083% nebulizer solution Take 2.5 mg by nebulization every 6 (six) hours as needed for wheezing or shortness of breath.     albuterol  (VENTOLIN  HFA) 108 (90 Base) MCG/ACT inhaler Inhale into the lungs every 6 (six) hours as needed for wheezing or shortness of breath.     ALPRAZolam  (XANAX ) 1 MG tablet Take 1 mg by mouth at bedtime as needed for anxiety. Takes 1 a day if needed     amoxicillin  (AMOXIL ) 500 MG capsule Take 4 capsules at once by mouth 1 hour prior to the dental procedure. 4 capsule 0   apixaban  (ELIQUIS ) 5 MG TABS tablet Take 1 tablet (5 mg  total) by mouth 2 (two) times daily. 180 tablet 3   brimonidine  (ALPHAGAN ) 0.2 % ophthalmic solution Place 1 drop into both eyes 2 (two) times daily. (Patient taking differently: Place 1 drop into both eyes at bedtime.) 15 mL 3   chlorpheniramine-HYDROcodone  (TUSSIONEX) 10-8 MG/5ML Take 5 mLs by mouth.      Cholecalciferol  (VITAMIN D3) 50 MCG (2000 UT) TABS Take 2,000 Units by mouth daily.     dexlansoprazole  (DEXILANT ) 60 MG capsule Take 1 capsule (60 mg total) by mouth daily. 90 capsule 3   diltiazem 2 % GEL Apply 1 Application topically 2 (two) times daily.     estradiol  (ESTRACE ) 0.1 MG/GM vaginal cream Place 1 Applicatorful vaginally at bedtime.     fenofibrate  (TRICOR ) 145 MG tablet Take 1 tablet (145 mg total) by mouth daily. 90 tablet 3   HYDROcodone -acetaminophen  (NORCO/VICODIN) 5-325 MG tablet One tablet every four hours as needed for acute pain.  Limit of five days per [place] statue. 30 tablet 0   irbesartan  (AVAPRO ) 150 MG tablet Take 150 mg by mouth daily. Takes 75mg  in morning     latanoprost  (XALATAN ) 0.005 % ophthalmic solution Place 1 drop into both eyes at bedtime. 9 mL 3   lidocaine  (XYLOCAINE ) 5 % ointment Apply 1 Application topically as needed. 35.44 g 0   Magnesium  500 MG TABS Take 1 tablet by mouth in the morning and at bedtime. Taking 1 in he morning, afternoon, and at bedtime     meclizine  (ANTIVERT ) 12.5 MG tablet Take 12.5 mg by mouth 3 (three) times daily as needed for dizziness.     metroNIDAZOLE  (METROCREAM ) 0.75 % cream Apply topically 2 (two) times daily.     mirtazapine  (REMERON ) 7.5 MG tablet Take 1 tablet (7.5 mg total) by mouth at bedtime. 90 tablet 0   nitroGLYCERIN  (NITROLINGUAL ) 0.4 MG/SPRAY spray Place 1 spray under the tongue every 5 (five) minutes as needed for chest pain. 12 g 3   Nitroglycerin  0.4 % OINT Place rectally.     ondansetron  (ZOFRAN ) 4 MG tablet TAKE ONE TABLET BY MOUTH EVERY 8 HOURS AS NEEDED FOR NAUSEA OR VOMITING 30 tablet 1   Probiotic Product (PROBIOTIC DAILY PO) Take 1 capsule by mouth daily. VSL #3     ranolazine  (RANEXA ) 500 MG 12 hr tablet Take 1 tablet (500 mg total) by mouth 2 (two) times daily. 180 tablet 3   rizatriptan  (MAXALT -MLT) 10 MG disintegrating tablet DISSOLVE ONE TABLET ON TONGUE AS NEEDED FOR MIGRAINE. MAY REPEAT IN 2  HOURS IF NEEDED (Patient taking differently: Take 10 mg by mouth as needed for migraine.) 9 tablet 6   XIIDRA  5 % SOLN Apply 1 drop to eye at bedtime.     zolpidem  (AMBIEN ) 10 MG tablet Take 1 tablet (10 mg total) by mouth at bedtime as needed for sleep. 90 tablet 1   HYDROcodone -acetaminophen  (NORCO/VICODIN) 5-325 MG tablet Take 1 tablet by mouth every 6 (six) hours as needed for moderate pain (pain score 4-6). Got it from ortho [address] with [facility]     No facility-administered medications prior to visit.    Allergies  Allergen Reactions   Nutmeg Oil (Myristica Oil) Anaphylaxis    Nut oil- skin irritation   Adhesive [Tape] Other (See Comments)    Blisters skin   Aspirin Other (See Comments)    Sickle cell trait--  Not recommended unless emergency   Imitrex  [Sumatriptan ] Hives   Levaquin  [Levofloxacin  In D5w] Other (See Comments)  Altered mental status Affects G6PD   Statins Other (See Comments)    Myopathy - elevated CK   Keflex  [Cephalexin ] Other (See Comments)    G6PD   Latex Rash   Sulfa Antibiotics Other (See Comments)    Patient has sickle cell trait    Review of Systems  Constitutional:  Positive for fatigue. Negative for chills and fever.  HENT:  Positive for congestion. Negative for sore throat.   Eyes:  Positive for photophobia, pain and visual disturbance. Negative for discharge.  Respiratory:  Positive for cough (Intermittent). Negative for shortness of breath and wheezing.   Cardiovascular:  Negative for chest pain and palpitations.  Gastrointestinal:  Positive for constipation. Negative for abdominal pain, blood in stool, diarrhea, nausea and vomiting.  Endocrine: Negative for polydipsia and polyuria.  Genitourinary:  Negative for dysuria and hematuria.  Musculoskeletal:  Positive for arthralgias, back pain, neck pain and neck stiffness.  Skin:  Negative for rash.  Neurological:  Positive for weakness. Negative for dizziness.  Psychiatric/Behavioral:   Negative for agitation and behavioral problems. The patient is nervous/anxious.        Objective:    Physical Exam Vitals reviewed.  Constitutional:      General: She is not in acute distress.    Appearance: She is not diaphoretic.  HENT:     Head: Normocephalic and atraumatic.     Nose: No congestion.     Mouth/Throat:     Mouth: Mucous membranes are moist.  Eyes:     General: No scleral icterus.    Extraocular Movements: Extraocular movements intact.  Cardiovascular:     Rate and Rhythm: Normal rate and regular rhythm.     Heart sounds: Normal heart sounds. No murmur heard. Pulmonary:     Breath sounds: Normal breath sounds. No wheezing or rales.  Abdominal:     Palpations: Abdomen is soft.     Tenderness: There is no abdominal tenderness.  Musculoskeletal:     Left hand: Decreased strength of thumb/finger opposition.     Cervical back: Neck supple. No tenderness.     Lumbar back: Tenderness present. Decreased range of motion. Negative right straight leg raise test and negative left straight leg raise test.     Right knee: Decreased range of motion. Tenderness present.     Left knee: Decreased range of motion. Tenderness present.     Right lower leg: No edema.     Left lower leg: No edema.  Skin:    General: Skin is warm.     Findings: No rash.  Neurological:     General: No focal deficit present.     Mental Status: She is alert and oriented to person, place, and time.     Sensory: No sensory deficit.     Motor: No weakness.  Psychiatric:        Mood and Affect: Mood normal.        Behavior: Behavior normal.     BP 110/71   Pulse 92   Ht 5' 7 (1.702 m)   Wt 200 lb 6.4 oz (90.9 kg)   SpO2 92%   BMI 31.39 kg/m  Wt Readings from Last 3 Encounters:  04/11/24 200 lb 6.4 oz (90.9 kg)  03/02/24 199 lb (90.3 kg)  02/22/24 199 lb 1.2 oz (90.3 kg)        Assessment & Plan:   Problem List Items Addressed This Visit       Cardiovascular and Mediastinum    Recurrent  pulmonary embolism (HCC)   Continue Eliquis  5 mg twice daily - chills may be related to Eliquis  Followed by heme./Onc. - considering recurrent pulmonary embolism, history of G6PD mutation and sickle cell trait, needs indefinite AC Evaluated by pulmonology for recurrent PE and asthma        Musculoskeletal and Integument   DDD (degenerative disc disease), lumbosacral   Avoid heavy lifting and frequent bending Advised to use back brace and/or heating pad as needed Has been evaluated by orthopedic surgeon - Norco as needed for severe pain Oral Prednisone  40 mg once daily X 5 days      Relevant Medications   predniSONE  (DELTASONE ) 20 MG tablet     Genitourinary   Acute cystitis without hematuria - Primary   UA reviewed Started empiric Macrobid  Maintain adequate hydration Her back pain is less likely related to UTI      Relevant Medications   nitrofurantoin , macrocrystal-monohydrate, (MACROBID ) 100 MG capsule   Other Relevant Orders   Urinalysis   Urine Culture     Other   Anxiety (Chronic)   H/o panic disorder as well Well-controlled with Xanax  Xanax  1 mg PRN, takes it only for panic episodes Although it has been reported that her fall was related to Xanax , it is less likely as she had orthostatic symptoms and dehydration during that episode        Meds ordered this encounter  Medications   nitrofurantoin , macrocrystal-monohydrate, (MACROBID ) 100 MG capsule    Sig: Take 1 capsule (100 mg total) by mouth 2 (two) times daily.    Dispense:  10 capsule    Refill:  0   predniSONE  (DELTASONE ) 20 MG tablet    Sig: Take 2 tablets (40 mg total) by mouth daily with breakfast.    Dispense:  10 tablet    Refill:  0     Dametra Whetsel MARLA Blanch, MD

## 2024-04-12 ENCOUNTER — Telehealth: Payer: Self-pay | Admitting: Neurology

## 2024-04-12 NOTE — Telephone Encounter (Signed)
 Pt asking for front desk Rosaline RAMAN.  to  schedule Pt  transportaion for appt on 05/01/24 with Dr. Gregg.

## 2024-04-13 LAB — URINALYSIS
Bilirubin, UA: NEGATIVE
Glucose, UA: NEGATIVE
Ketones, UA: NEGATIVE
Nitrite, UA: NEGATIVE
Protein,UA: NEGATIVE
RBC, UA: NEGATIVE
Specific Gravity, UA: 1.015 (ref 1.005–1.030)
Urobilinogen, Ur: 0.2 mg/dL (ref 0.2–1.0)
pH, UA: 6.5 (ref 5.0–7.5)

## 2024-04-14 DIAGNOSIS — R55 Syncope and collapse: Secondary | ICD-10-CM | POA: Diagnosis not present

## 2024-04-14 LAB — URINE CULTURE

## 2024-04-19 ENCOUNTER — Ambulatory Visit: Payer: Self-pay | Admitting: Internal Medicine

## 2024-04-25 ENCOUNTER — Telehealth (HOSPITAL_BASED_OUTPATIENT_CLINIC_OR_DEPARTMENT_OTHER): Payer: Self-pay | Admitting: Licensed Clinical Social Worker

## 2024-04-25 NOTE — Telephone Encounter (Signed)
 H&V Care Navigation CSW Progress Note  Clinical Social Worker received a call from pt to inquire about ride. She shared she got an appt reminder for [facility] appt but hasn't confirmed it bc she called and her ride still hasn't been set up. LCSW coordinated with Kerri, scheduling who plans on sending the email voucher securely today to [place]. LCSW will let pt know when completed.  Patient is participating in a Managed Medicaid Plan:  No, Medicare and [employer]  SDOH Screenings   Food Insecurity: No Food Insecurity (02/06/2024)  Housing: Low Risk  (02/06/2024)  Recent Concern: Housing - High Risk (02/06/2024)  Transportation Needs: Unmet Transportation Needs (04/11/2024)  Utilities: Not At Risk (02/06/2024)  Alcohol Screen: Low Risk  (06/11/2023)  Depression (PHQ2-9): Low Risk  (04/11/2024)  Recent Concern: Depression (PHQ2-9) - Medium Risk (02/02/2024)  Financial Resource Strain: Low Risk  (06/11/2023)  Physical Activity: Sufficiently Active (06/11/2023)  Social Connections: Socially Isolated (02/06/2024)  Stress: No Stress Concern Present (06/11/2023)  Tobacco Use: Medium Risk (04/11/2024)  Health Literacy: Adequate Health Literacy (06/11/2023)    Marit Lark, MSW, LCSW Clinical Social Worker II [facility] Heart/Vascular Care Navigation  [phone]- work cell phone (preferred)

## 2024-04-26 ENCOUNTER — Telehealth: Payer: Self-pay | Admitting: Cardiology

## 2024-04-26 NOTE — Telephone Encounter (Signed)
 Phone number -[phone] Pick up Address- [address] [place] [address] Drop office address -[address] [place] [address] Pick up Date- 05/02/2024 Pick up Time- 3:45pm  Pt has confirmed, please called [phone] for pick up [place]      04/26/2024  EARNEST THALMAN DOB: 04/09/1951 MRN: [id number]   RIDER WAIVER AND RELEASE OF LIABILITY  For the purposes of helping with transportation needs, [facility] partners with outside transportation providers (taxi companies, [place], Catering manager.) to give [facility] patients or other approved people the choice of on-demand rides Public librarian) to our buildings for non-emergency visits.  By using [employer], I, the person signing this document, on behalf of myself and/or any legal minors (in my care using the [employer]), agree:  Science writer given to me are supplied by independent, outside transportation providers who do not work for, or have any affiliation with, [employer]. [place] is not a transportation company. [facility] has no control over the quality or safety of the rides I get using [employer]. [facility] has no control over whether any outside ride will happen on time or not. [facility] gives no guarantee on the reliability, quality, safety, or availability on any rides, or that no mistakes will happen. I know and accept that traveling by vehicle (car, truck, SVU, fleeta, bus, taxi, etc.) has risks of serious injuries such as disability, being paralyzed, and death. I know and agree the risk of using [employer] is mine alone, and not [employer]. [employer] are provided as is and as are available. The transportation providers are in charge for all inspections and care of the vehicles used to provide these rides. I agree not to take legal action against [facility], its agents, employees, officers, directors, representatives, insurers, attorneys, assigns, successors, subsidiaries,  and affiliates at any time for any reasons related directly or indirectly to using [employer]. I also agree not to take legal action against [facility] or its affiliates for any injury, death, or damage to property caused by or related to using [employer]. I have read this Waiver and Release of Liability, and I understand the terms used in it and their legal meaning. This Waiver is freely and voluntarily given with the understanding that my right (or any legal minors) to legal action against [place] relating to [employer] is knowingly given up to use these services.   I attest that I read the Ride Waiver and Release of Liability to Levorn FORBES Batter, gave Ms. Tretter the opportunity to ask questions and answered the questions asked (if any). I affirm that ALONDRIA MOUSSEAU then provided consent for assistance with transportation.     Samuel G Mcdowell

## 2024-04-26 NOTE — Progress Notes (Signed)
 Pt    04/26/2024  Charnita Trudel Hargens DOB: Apr 02, 1951 MRN: [id number]   RIDER WAIVER AND RELEASE OF LIABILITY  For the purposes of helping with transportation needs, [facility] partners with outside transportation providers (taxi companies, [place], Catering manager.) to give [facility] patients or other approved people the choice of on-demand rides Public librarian) to our buildings for non-emergency visits.  By using [employer], I, the person signing this document, on behalf of myself and/or any legal minors (in my care using the [employer]), agree:  Science writer given to me are supplied by independent, outside transportation providers who do not work for, or have any affiliation with, [employer]. [place] is not a transportation company. [facility] has no control over the quality or safety of the rides I get using [employer]. [facility] has no control over whether any outside ride will happen on time or not. [facility] gives no guarantee on the reliability, quality, safety, or availability on any rides, or that no mistakes will happen. I know and accept that traveling by vehicle (car, truck, SVU, fleeta, bus, taxi, etc.) has risks of serious injuries such as disability, being paralyzed, and death. I know and agree the risk of using [employer] is mine alone, and not [employer]. [employer] are provided as is and as are available. The transportation providers are in charge for all inspections and care of the vehicles used to provide these rides. I agree not to take legal action against [facility], its agents, employees, officers, directors, representatives, insurers, attorneys, assigns, successors, subsidiaries, and affiliates at any time for any reasons related directly or indirectly to using [employer]. I also agree not to take legal action against [facility] or its affiliates for any injury, death, or damage to property caused by or related to  using [employer]. I have read this Waiver and Release of Liability, and I understand the terms used in it and their legal meaning. This Waiver is freely and voluntarily given with the understanding that my right (or any legal minors) to legal action against [place] relating to [employer] is knowingly given up to use these services.   I attest that I read the Ride Waiver and Release of Liability to Levorn BRAVO Batter, gave Ms. Strandberg the opportunity to ask questions and answered the questions asked (if any). I affirm that KYESHIA ZINN then provided consent for assistance with transportation.     Jayson KANDICE Sierras MD

## 2024-04-28 DIAGNOSIS — H43813 Vitreous degeneration, bilateral: Secondary | ICD-10-CM | POA: Diagnosis not present

## 2024-04-28 DIAGNOSIS — H04123 Dry eye syndrome of bilateral lacrimal glands: Secondary | ICD-10-CM | POA: Diagnosis not present

## 2024-04-28 DIAGNOSIS — H401133 Primary open-angle glaucoma, bilateral, severe stage: Secondary | ICD-10-CM | POA: Diagnosis not present

## 2024-04-28 DIAGNOSIS — H34831 Tributary (branch) retinal vein occlusion, right eye, with macular edema: Secondary | ICD-10-CM | POA: Diagnosis not present

## 2024-05-01 ENCOUNTER — Encounter: Payer: Self-pay | Admitting: Neurology

## 2024-05-01 ENCOUNTER — Ambulatory Visit (INDEPENDENT_AMBULATORY_CARE_PROVIDER_SITE_OTHER): Payer: Medicare Other | Admitting: Neurology

## 2024-05-01 VITALS — BP 137/84 | HR 83 | Ht 67.0 in | Wt 202.0 lb

## 2024-05-01 DIAGNOSIS — G43009 Migraine without aura, not intractable, without status migrainosus: Secondary | ICD-10-CM | POA: Diagnosis not present

## 2024-05-01 NOTE — Patient Instructions (Signed)
 Continue current medications Continue with [facility]  as needed Continue follow-up with your other doctors regarding your other medical conditions Return as needed.

## 2024-05-01 NOTE — Progress Notes (Signed)
 [facility]  PATIENT: [name] DOB: 05-07-1951  REQUESTING CLINICIAN: Tobie Suzzane POUR, MD HISTORY FROM: Patient  REASON FOR VISIT: Migraines   HISTORICAL  CHIEF COMPLAINT:  Chief Complaint  Patient presents with   Migraine    Room 13  Pt is 6 month follow up pt had when stated that she when to hospital , she was to she blood clot, she stated fizzy , she was told that  she had Lupus she told that positive results because of the blood thins, she stated that past out couple time she wasn't sure what cause this   INTERVAL HISTORY 04/28/2024:  Patient presents today for follow-up, she is alone.  Last visit was in January, at that time she reported her migraines were well-controlled, only took 4 Maxalt  in the past 6 months.  Unfortunately the next day she was complaining of more shortness of breath, went to see cardiologist and referred to the hospital where she was found to have multiple PEs.  She has a history of PE, therefore was restarted on anticoagulation and recommended to stay on lifelong anticoagulation.  Since then, she has been in the hospital twice, 1 for chest pain, tiredness and the last 1 for syncope in May. Again in terms of the headaches, patient tells me that her headaches are well controlled.  In the last 4 months she has only taken 3 Maxalt .  When she was being treated with heparin  in the hospital she did have some headache but since being on Eliquis  having headaches subsided. Her other complaint is left knee pain. She has appointment with orthopedist in 2 days.    INTERVAL HISTORY 11/03/2023:  Patient presents today for follow up. Last visit was 6 months ago.  Since then she has been doing well in terms of the headaches.  She tells me since July of last year until now she has taken the Maxalt  only 4 times, last one was this morning.   For the past 6 weeks has been having a cough, a week and a half ago woke up with fever, weakness, dyspnea on  exertion, could not walk more than 50 feet.  She could not do anything.  She took couple of COVID test which were negative.  Last Thursday, she did have a fall in the middle of the night, she woke up to turn her humidifier on and next thing she is waking up on the floor with urinary incontinence.  Denies any injury, no tongue biting.  She is seeing her cardiologist tomorrow.    INTERVAL HISTORY 04/15/2023:  Patient presents today for follow-up, she is alone.  Last visit was in January and since then she continues to have headaches.  She reports on average 2 headaches per month for which Maxalt  helps a lot.  She is not on any preventive medication.  She reports decreased vision, she is following with ophthalmology.  She has glaucoma.  She does report sometimes she is straining and believes this is triggering her headaches.  No other complaint no other concerns.   INTERVAL HISTORY 10/14/22:  Patient presents today for follow-up, last visit was in September.  At that time we have started her on Maxalt  for abortive medication and Nurtec for preventive medication.  Due to previous report of patient being allergic to Nurtec, the Nurtec was not renewed.  She reports since September she had a total of 5 migraines in which the Maxalt  has helped.  She reports Maxalt  controlled her headaches.  She does not need the Nurtec as preventive medication anymore. She has not had any headaches for the past 2 months    INTERVAL HISTORY 06/24/22 Patient presents today for follow-up, last visit was in March.  Since then, she reports that her back pain has improved after getting back injections.  When it comes to the headaches, she denies any change in frequency.  At last visit I have started her on Nurtec every other day but patient is not sure if she is taking it.  She stated she takes a lot of medication and she is unable to tell me if she take Nurtec or not.  Currently when she is has a headache she does not take any  medication.  Her MRI brain was completed, did not show any acute intracranial abnormality.   HISTORY OF PRESENT ILLNESS:  This is a 73 year old woman with multiple medical conditions including chronic pain, hypertension, hyperlipidemia, glaucoma, hypothyroidism, migraine headaches who is presenting with complaint of right-sided weakness.  Patient reports history of right arm and right leg weakness. She presented to her orthopedist Dr. Brenna and due to the complaint of right-sided arm and leg weakness, she was referred to Neurology.  At that same time she was also given a right shoulder ESI and had MRI of her shoulder.  MRI shoulder showed evidence of tears and arthritis and patient also reported that after the injection she felt much better.  At that time she did not have any neck pain.  Currently she reported the shoulder pain resolved, she is not experiencing neck pain, she is able to lift her right arm above her shoulder and this is a marked improvement.  For her right leg weakness pain, she attributed to chronic low back pain, she states that both legs sometimes do get weak. She denies any fall recently.    Patient also complained of migraine.  She reported a history of migraine, in the past she has tried Imitrex  as abortive medication, as tired over-the-counter medications but lately her migraines have reappear.  Currently she is having 4 headaches days per week.  She is not on any abortive or preventive medication.  When she has the headaches, she reports putting ice or wrap her head with something cold and going to sleep.   She does also note that sometimes she get confused, sometimes has word finding difficulty but she is able to hold conversation, she is still independent in all activities of daily living and all IADLs  She reports that she lives alone, she does not have any family here, her son is in [place]    OTHER MEDICAL CONDITIONS: Chronic pain, hypertension, glaucoma, hyperlipidemia,  hypothyroidism    REVIEW OF SYSTEMS: Full 14 system review of systems performed and negative with exception of: as noted in the HPI   ALLERGIES: Allergies  Allergen Reactions   Nutmeg Oil (Myristica Oil) Anaphylaxis    Nut oil- skin irritation   Adhesive [Tape] Other (See Comments)    Blisters skin   Aspirin Other (See Comments)    Sickle cell trait--  Not recommended unless emergency   Imitrex  [Sumatriptan ] Hives   Levaquin  [Levofloxacin  In D5w] Other (See Comments)    Altered mental status Affects G6PD   Statins Other (See Comments)    Myopathy - elevated CK   Keflex  [Cephalexin ] Other (See Comments)    G6PD   Latex Rash   Sulfa Antibiotics Other (See Comments)    Patient has sickle cell trait    HOME  MEDICATIONS: Outpatient Medications Prior to Visit  Medication Sig Dispense Refill   albuterol  (PROVENTIL ) (2.5 MG/3ML) 0.083% nebulizer solution Take 2.5 mg by nebulization every 6 (six) hours as needed for wheezing or shortness of breath.     albuterol  (VENTOLIN  HFA) 108 (90 Base) MCG/ACT inhaler Inhale into the lungs every 6 (six) hours as needed for wheezing or shortness of breath.     ALPRAZolam  (XANAX ) 1 MG tablet Take 1 mg by mouth at bedtime as needed for anxiety. Takes 1 a day if needed     amoxicillin  (AMOXIL ) 500 MG capsule Take 4 capsules at once by mouth 1 hour prior to the dental procedure. 4 capsule 0   apixaban  (ELIQUIS ) 5 MG TABS tablet Take 1 tablet (5 mg total) by mouth 2 (two) times daily. 180 tablet 3   brimonidine  (ALPHAGAN ) 0.2 % ophthalmic solution Place 1 drop into both eyes 2 (two) times daily. (Patient taking differently: Place 1 drop into both eyes at bedtime.) 15 mL 3   chlorpheniramine-HYDROcodone  (TUSSIONEX) 10-8 MG/5ML Take 5 mLs by mouth.     Cholecalciferol  (VITAMIN D3) 50 MCG (2000 UT) TABS Take 2,000 Units by mouth daily.     dexlansoprazole  (DEXILANT ) 60 MG capsule Take 1 capsule (60 mg total) by mouth daily. 90 capsule 3   diltiazem 2 % GEL  Apply 1 Application topically 2 (two) times daily.     estradiol  (ESTRACE ) 0.1 MG/GM vaginal cream Place 1 Applicatorful vaginally at bedtime.     fenofibrate  (TRICOR ) 145 MG tablet Take 1 tablet (145 mg total) by mouth daily. 90 tablet 3   HYDROcodone -acetaminophen  (NORCO/VICODIN) 5-325 MG tablet One tablet every four hours as needed for acute pain.  Limit of five days per [place] statue. 30 tablet 0   irbesartan  (AVAPRO ) 150 MG tablet Take 150 mg by mouth daily. Takes 75mg  in morning     latanoprost  (XALATAN ) 0.005 % ophthalmic solution Place 1 drop into both eyes at bedtime. 9 mL 3   lidocaine  (XYLOCAINE ) 5 % ointment Apply 1 Application topically as needed. 35.44 g 0   Magnesium  500 MG TABS Take 1 tablet by mouth in the morning and at bedtime. Taking 1 in he morning, afternoon, and at bedtime     meclizine  (ANTIVERT ) 12.5 MG tablet Take 12.5 mg by mouth 3 (three) times daily as needed for dizziness.     metroNIDAZOLE  (METROCREAM ) 0.75 % cream Apply topically 2 (two) times daily.     mirtazapine  (REMERON ) 7.5 MG tablet Take 1 tablet (7.5 mg total) by mouth at bedtime. 90 tablet 0   nitrofurantoin , macrocrystal-monohydrate, (MACROBID ) 100 MG capsule Take 1 capsule (100 mg total) by mouth 2 (two) times daily. 10 capsule 0   nitroGLYCERIN  (NITROLINGUAL ) 0.4 MG/SPRAY spray Place 1 spray under the tongue every 5 (five) minutes as needed for chest pain. 12 g 3   Nitroglycerin  0.4 % OINT Place rectally.     ondansetron  (ZOFRAN ) 4 MG tablet TAKE ONE TABLET BY MOUTH EVERY 8 HOURS AS NEEDED FOR NAUSEA OR VOMITING 30 tablet 1   predniSONE  (DELTASONE ) 20 MG tablet Take 2 tablets (40 mg total) by mouth daily with breakfast. 10 tablet 0   Probiotic Product (PROBIOTIC DAILY PO) Take 1 capsule by mouth daily. VSL #3     ranolazine  (RANEXA ) 500 MG 12 hr tablet Take 1 tablet (500 mg total) by mouth 2 (two) times daily. 180 tablet 3   rizatriptan  (MAXALT -MLT) 10 MG disintegrating tablet DISSOLVE ONE TABLET ON  TONGUE  AS NEEDED FOR MIGRAINE. MAY REPEAT IN 2 HOURS IF NEEDED (Patient taking differently: Take 10 mg by mouth as needed for migraine.) 9 tablet 6   XIIDRA  5 % SOLN Apply 1 drop to eye at bedtime.     zolpidem  (AMBIEN ) 10 MG tablet Take 1 tablet (10 mg total) by mouth at bedtime as needed for sleep. 90 tablet 1   No facility-administered medications prior to visit.    PAST MEDICAL HISTORY: Past Medical History:  Diagnosis Date   Anal fissure    Anxiety    Arthritis    Asthma    C. difficile diarrhea 09/17/2021   Cataract    Phreesia 01/05/2021   Chest pain    a. 02/2000 Cath: nl cors, EF 60%;  b. 10/2010 MV: nl LV, no ischemia/infarct;  c. 03/2014 Admit c/p, r/o->grief from husbands death.   Chronic RUQ pain May 17, 2008   EUS slightly dilated CBD (7.76mm), otherwise nl   Depression    Diverticulosis    Eczema    Exposure to chemical inhalation mid 1970s   Fatty liver    G6PD deficiency    Gallstones    GERD (gastroesophageal reflux disease)    Glaucoma    History of pulmonary embolus (PE)    Treated with Eliquis  05-18-2015   HTN (hypertension)    Hypercholesteremia    a. intolerant to statins.   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 She has limited vision in the right eye.   Migraines    inner ocular   Ocular migraine    Palpitations    Pleurisy    Pneumonia 07/16/2021   Patient was exposed to a harsh chemical in the 1970's that created scar tissue in her lungs. She has had pneumonia several times in the past.   PONV (postoperative nausea and vomiting)    Pulmonary emboli (HCC)    Recurrent upper respiratory infection (URI)    Renal cyst    Retinal cyst    Sickle cell trait (HCC)    Tubular adenoma of colon 09/17/2011   Urticaria     PAST SURGICAL HISTORY: Past Surgical History:  Procedure Laterality Date   ABDOMINAL HYSTERECTOMY     ADENOIDECTOMY     ANGIOPLASTY     APPENDECTOMY      BIOPSY N/A 03/14/2013   Procedure: SMALL BOWEL AND GASTRIC BIOPSIES (Procedure #1);  Surgeon: Margo LITTIE Haddock, MD;  Location: [facility];  Service: Endoscopy;  Laterality: N/A;   CARDIAC CATHETERIZATION Left 02/11/2000   CATARACT EXTRACTION Left    CATARACT EXTRACTION W/PHACO Right 03/19/2021   Procedure: CATARACT EXTRACTION PHACO AND INTRAOCULAR LENS PLACEMENT (IOC) RIGHT 6.75 00:50.4;  Surgeon: Mittie Gaskin, MD;  Location[facility];  Service: Ophthalmology;  Laterality: Right;   CHOLECYSTECTOMY  12/2007   COLONOSCOPY  12/22/2010   MFM:[id number], cecal adenomatous polyp   COLONOSCOPY WITH PROPOFOL  N/A 03/31/2016   Dr. Haddock: four sessile polyps rectum/sigmoid colon, diverticulosi, ext/int hemorrhoids. hyperplastic polyps, next tcs 5 years.   COLONOSCOPY WITH PROPOFOL  N/A 04/21/2021   anal fissure, non-bleeding internal hemorrhoids, right colon diverticulosis, one 2 mm polyp in ascending, three 4-6 mm polyps in descending colon. Tubular adenomas. 5 year surveillance.   complete hysterectomy     ENTEROSCOPY N/A 03/14/2013   DOQ:fpoi gastritis/ulcers has healed   ESOPHAGOGASTRODUODENOSCOPY  09/22/2011   DOQ:Fpoi gastritis/Duodenitis   EXCISIONAL HEMORRHOIDECTOMY  FLEXIBLE SIGMOIDOSCOPY N/A 03/14/2013   SLF:3 colon polyp removed/moderate sized internal hemorrhoids   FLEXIBLE SIGMOIDOSCOPY N/A 06/09/2016   Procedure: FLEXIBLE SIGMOIDOSCOPY;  Surgeon: Margo LITTIE Haddock, MD;  Location: [facility];  Service: Endoscopy;  Laterality: N/A;  rectal polyps times 2   FLEXIBLE SIGMOIDOSCOPY N/A 03/18/2018   Procedure: FLEXIBLE SIGMOIDOSCOPY;  Surgeon: Haddock Margo LITTIE, MD;  Location: [facility];  Service: Endoscopy;  Laterality: N/A;  9:15am   FOOT SURGERY     bunion removal left   GIVENS CAPSULE STUDY N/A 02/10/2013   Procedure: GIVENS CAPSULE STUDY;  Surgeon: Margo LITTIE Haddock, MD;  Location: [facility];  Service: Endoscopy;  Laterality: N/A;  730   HEEL SPUR EXCISION      right    HEMORRHOID BANDING N/A 03/14/2013   Procedure: HEMORRHOID BANDING (Procedure #3)  3 bands applied Onu#[id number] Exp 02/02/2014 ;  Surgeon: Margo LITTIE Haddock, MD;  Location: [facility];  Service: Endoscopy;  Laterality: N/A;   HEMORRHOID SURGERY N/A 04/24/2016   Procedure: EXTENSIVE HEMORRHOIDECTOMY;  Surgeon: Selinda Artist Moats, MD;  Location: [facility];  Service: General;  Laterality: N/A;   LAPAROSCOPY     adhesions   POLYPECTOMY N/A 03/14/2013   DOQ:FPOI Gastritis . ULCERS SEEN ON MAY 6 HAVE HEALED   POLYPECTOMY  03/31/2016   Procedure: POLYPECTOMY;  Surgeon: Margo LITTIE Haddock, MD;  Location: [facility];  Service: Endoscopy;;  sigmoid colon polyps x2, rectal polyps x2   POLYPECTOMY  04/21/2021   Procedure: POLYPECTOMY;  Surgeon: Cindie Carlin POUR, DO;  Location: [facility];  Service: Endoscopy;;   RECTAL EXAM UNDER ANESTHESIA N/A 07/23/2021   Procedure: RECTAL EXAM UNDER ANESTHESIA;  Surgeon: Debby Hila, MD;  Location[facility];  Service: General;  Laterality: N/A;   SPHINCTEROTOMY N/A 07/23/2021   Procedure: CHEMICAL SPHINCTEROTOMY;  Surgeon: Debby Hila, MD;  Location: [facility];  Service: General;  Laterality: N/A;   TONSILLECTOMY     TUBAL LIGATION      FAMILY HISTORY: Family History  Problem Relation Age of Onset   Colon cancer Mother        59s   Urticaria Mother    Heart disease Mother    Colon polyps Mother    Cancer Father        oral   Crohn's disease Sister    Cancer Sister        around heart   Colon polyps Sister    Diabetes Brother    Other Brother        intestinal sugery   Colon polyps Brother    Colon cancer Maternal Grandmother    Colon cancer Maternal Grandfather    Colon cancer Paternal Grandfather    Anesthesia problems Neg Hx     SOCIAL HISTORY: Social History   Socioeconomic History   Marital status: Widowed    Spouse name: Not on file   Number of children: 1   Years of education: Not on file    Highest education level: Not on file  Occupational History   Occupation: homemaker    Employer: UNEMPLOYED  Tobacco Use   Smoking status: Former    Current packs/day: 0.00    Average packs/day: 0.5 packs/day for 30.0 years (15.0 ttl pk-yrs)    Types: Cigarettes    Start date: 10/15/1966    Quit date: 10/15/1996    Years since quitting: 27.5   Smokeless tobacco: Never   Tobacco comments:    Stopped smoking ~  2000  Vaping Use   Vaping status: Never Used  Substance and Sexual Activity   Alcohol use: Not Currently    Alcohol/week: 4.0 standard drinks of alcohol    Types: 4 Glasses of wine per week    Comment: 1 drinks every couple of weeks   Drug use: No   Sexual activity: Not Currently    Birth control/protection: Surgical  Other Topics Concern   Not on file  Social History Narrative   Does not routinely exercise. Husband passed in JUN 2015 due to prostate ca.   Social Drivers of Corporate investment banker Strain: Low Risk  (06/11/2023)   Overall Financial Resource Strain (CARDIA)    Difficulty of Paying Living Expenses: Not hard at all  Food Insecurity: No Food Insecurity (02/06/2024)   Hunger Vital Sign    Worried About Running Out of Food in the Last Year: Never true    Ran Out of Food in the Last Year: Never true  Transportation Needs: Unmet Transportation Needs (04/26/2024)   PRAPARE - Transportation    Lack of Transportation (Medical): Yes    Lack of Transportation (Non-Medical): Yes  Physical Activity: Sufficiently Active (06/11/2023)   Exercise Vital Sign    Days of Exercise per Week: 6 days    Minutes of Exercise per Session: 30 min  Stress: No Stress Concern Present (06/11/2023)   [employer] of Occupational Health - Occupational Stress Questionnaire    Feeling of Stress : Not at all  Social Connections: Socially Isolated (02/06/2024)   Social Connection and Isolation Panel    Frequency of Communication with Friends and Family: Three times a week    Frequency of  Social Gatherings with Friends and Family: Once a week    Attends Religious Services: Never    Database administrator or Organizations: No    Attends Banker Meetings: Never    Marital Status: Widowed  Intimate Partner Violence: Not At Risk (02/06/2024)   Humiliation, Afraid, Rape, and Kick questionnaire    Fear of Current or Ex-Partner: No    Emotionally Abused: No    Physically Abused: No    Sexually Abused: No    PHYSICAL EXAM    GENERAL EXAM/CONSTITUTIONAL: Vitals:  Vitals:   05/01/24 1306  BP: 137/84  Pulse: 83  Weight: 202 lb (91.6 kg)  Height: 5' 7 (1.702 m)    Body mass index is 31.64 kg/m. Wt Readings from Last 3 Encounters:  05/01/24 202 lb (91.6 kg)  04/11/24 200 lb 6.4 oz (90.9 kg)  03/02/24 199 lb (90.3 kg)   Patient is in no distress; well developed, nourished and groomed; neck is supple  EYES: Loss of peripheral vision bilaterally ,reports vision loss with the left. Extraocular movements intacts   MUSCULOSKELETAL: Gait, strength, tone, movements noted in Neurologic exam below  NEUROLOGIC: MENTAL STATUS:     05/18/2022    3:33 PM  MMSE - Mini Mental State Exam  Not completed: Unable to complete   awake, alert, oriented to person, place and time recent and remote memory intact normal attention and concentration language fluent, comprehension intact, naming intact fund of knowledge appropriate  CRANIAL NERVE:  2nd, 3rd, 4th, 6th - Loss of peripheral vision on the right., no vision on the left. extraocular muscles intact, no nystagmus 5th - facial sensation symmetric 7th - facial strength symmetric 8th - hearing intact 9th - palate elevates symmetrically, uvula midline 11th - shoulder shrug symmetric 12th - tongue protrusion midline  MOTOR:  normal bulk and tone, full strength in the BUE, BLE. RUE shoulder abduction is limited by pain.   SENSORY:  normal and symmetric to light touch, vibration  COORDINATION:   finger-nose-finger, fine finger movements normal  GAIT/STATION:  Normal, negative romberg      DIAGNOSTIC DATA (LABS, IMAGING, TESTING) - I reviewed patient records, labs, notes, testing and imaging myself where available.  Lab Results  Component Value Date   WBC 5.0 02/06/2024   HGB 11.9 (L) 02/06/2024   HCT 35.0 (L) 02/06/2024   MCV 98.3 02/06/2024   PLT 198 02/06/2024      Component Value Date/Time   NA 141 02/06/2024 1232   NA 141 11/17/2023 0934   K 3.5 02/06/2024 1232   K 4.3 07/12/2012 0755   CL 107 02/06/2024 1232   CL 100 07/12/2012 0755   CO2 25 02/06/2024 1232   GLUCOSE 127 (H) 02/06/2024 1232   BUN 13 02/06/2024 1232   BUN 16 11/17/2023 0934   CREATININE 1.07 (H) 02/06/2024 1232   CREATININE 1.23 (H) 12/11/2020 1023   CALCIUM 8.8 (L) 02/06/2024 1232   CALCIUM 11.0 07/12/2012 0755   PROT 7.0 02/06/2024 1232   PROT 6.7 02/01/2023 1411   PROT 8.1 07/12/2012 0755   ALBUMIN 3.9 02/06/2024 1232   ALBUMIN 4.5 02/01/2023 1411   AST 20 02/06/2024 1232   AST 42 07/12/2012 0755   ALT 15 02/06/2024 1232   ALKPHOS 44 02/06/2024 1232   ALKPHOS 32 07/12/2012 0755   BILITOT 0.7 02/06/2024 1232   BILITOT 0.6 02/01/2023 1411   BILITOT 1.0 07/12/2012 0755   GFRNONAA 56 (L) 02/06/2024 1232   GFRNONAA 36 (L) 07/22/2020 1441   GFRAA 41 (L) 07/22/2020 1441   Lab Results  Component Value Date   CHOL 176 11/03/2023   HDL 22 (L) 11/03/2023   LDLCALC 119 (H) 11/03/2023   TRIG 173 (H) 11/03/2023   CHOLHDL 8.0 11/03/2023   Lab Results  Component Value Date   HGBA1C 5.1 (A) 11/10/2023   Lab Results  Component Value Date   VITAMINB12 681 11/24/2016   Lab Results  Component Value Date   TSH 1.393 11/03/2023    MRI Brain 01/05/22 1. 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 2. 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  ASSESSMENT AND PLAN  73 y.o. year old female who is presenting  for headache follow-up.  Her headaches are well-controlled with Maxalt , took Maxalt  3 times during the past 4 months. Her headaches are well controlled, we will continue with Maxalt . She will continue to follow up with her doctors regarding her other chronic conditions. Return as needed.   1. Migraine without aura and without status migrainosus, not intractable     Patient Instructions  Continue current medications Continue with Maxalt  as needed Continue follow-up with your other doctors regarding your other medical conditions Return as needed.  No orders of the defined types were placed in this encounter.   No orders of the defined types were placed in this encounter.   Return if symptoms worsen or fail to improve.    Pastor Falling, MD 05/01/2024, 3:29 PM  [facility] [address] [place], [place] [address] [phone]

## 2024-05-02 ENCOUNTER — Ambulatory Visit: Admitting: Cardiology

## 2024-05-02 ENCOUNTER — Other Ambulatory Visit (HOSPITAL_COMMUNITY)
Admission: RE | Admit: 2024-05-02 | Discharge: 2024-05-02 | Disposition: A | Discharge status: Home / Self Care | Source: Ambulatory Visit | Attending: "Endocrinology | Admitting: "Endocrinology

## 2024-05-02 ENCOUNTER — Encounter: Payer: Self-pay | Admitting: Cardiology

## 2024-05-02 VITALS — BP 132/80 | HR 60 | Ht 67.0 in | Wt 203.2 lb

## 2024-05-02 DIAGNOSIS — Z86711 Personal history of pulmonary embolism: Secondary | ICD-10-CM | POA: Insufficient documentation

## 2024-05-02 DIAGNOSIS — Z79899 Other long term (current) drug therapy: Secondary | ICD-10-CM | POA: Diagnosis not present

## 2024-05-02 DIAGNOSIS — I10 Essential (primary) hypertension: Secondary | ICD-10-CM | POA: Insufficient documentation

## 2024-05-02 DIAGNOSIS — Z789 Other specified health status: Secondary | ICD-10-CM

## 2024-05-02 DIAGNOSIS — E782 Mixed hyperlipidemia: Secondary | ICD-10-CM

## 2024-05-02 DIAGNOSIS — Z7901 Long term (current) use of anticoagulants: Secondary | ICD-10-CM | POA: Diagnosis not present

## 2024-05-02 DIAGNOSIS — Z86718 Personal history of other venous thrombosis and embolism: Secondary | ICD-10-CM | POA: Insufficient documentation

## 2024-05-02 DIAGNOSIS — R079 Chest pain, unspecified: Secondary | ICD-10-CM | POA: Diagnosis not present

## 2024-05-02 DIAGNOSIS — I2089 Other forms of angina pectoris: Secondary | ICD-10-CM

## 2024-05-02 LAB — MAGNESIUM: Magnesium: 1.8 mg/dL (ref 1.7–2.4)

## 2024-05-02 LAB — LIPID PANEL
Cholesterol: 242 mg/dL — ABNORMAL HIGH (ref 0–200)
HDL: 35 mg/dL — ABNORMAL LOW (ref >40)
LDL Cholesterol: 168 mg/dL — ABNORMAL HIGH (ref 0–99)
Total CHOL/HDL Ratio: 6.9 ratio
Triglycerides: 194 mg/dL — ABNORMAL HIGH (ref <150)
VLDL: 39 mg/dL (ref 0–40)

## 2024-05-02 LAB — T4, FREE: Free T4: 0.78 ng/dL (ref 0.61–1.12)

## 2024-05-02 LAB — TSH: TSH: 2.572 u[IU]/mL (ref 0.350–4.500)

## 2024-05-02 LAB — PHOSPHORUS: Phosphorus: 3 mg/dL (ref 2.5–4.6)

## 2024-05-02 MED ORDER — EZETIMIBE 10 MG PO TABS: 10.0000 mg | ORAL_TABLET | Freq: Every day | ORAL | 3 refills | Status: DC

## 2024-05-02 NOTE — Patient Instructions (Addendum)
 Medication Instructions:   START Zetia  10 mg daily  Please let us  know if you are unable to tolerate.  Labwork: In 6 months Fasting Lipids  Testing/Procedures: None today  Follow-Up: 6 months  Any Other Special Instructions Will Be Listed Below (If Applicable).  If you need a refill on your cardiac medications before your next appointment, please call your pharmacy.

## 2024-05-02 NOTE — Progress Notes (Signed)
 Cardiology Office Note  Date: 05/02/2024   ID: [name], DOB September 24, 1951, MRN [id number]  History of Present Illness: [name] is a 73 y.o. female last seen in April by Ms. Strader PA-C, I reviewed her note.  She is here for a follow-up visit.  Reports a feeling of fatigue, but has had no recurring chest pain or shortness of breath.  Has been experiencing some left knee swelling and discomfort, has visit pending with orthopedic provider.  She did have evidence of a Baker's cyst by left leg ultrasound back in March.  We went over her medications.  She reports compliance with therapy.  She does not report any spontaneous bleeding problems on Eliquis .  We discussed addition of Zetia  to fenofibrate  for better control of lipids.  She has a history of statin myalgias and also elevated CK levels on statins previously.  Her most recent LDL was up to 168.  I reviewed her echocardiogram from April.  Physical Exam: VS:  BP 132/80 (BP Location: Left Arm, Cuff Size: Normal)   Pulse 60   Ht 5' 7 (1.702 m)   Wt 203 lb 3.2 oz (92.2 kg)   SpO2 93%   BMI 31.83 kg/m , BMI Body mass index is 31.83 kg/m.  Wt Readings from Last 3 Encounters:  05/02/24 203 lb 3.2 oz (92.2 kg)  05/01/24 202 lb (91.6 kg)  04/11/24 200 lb 6.4 oz (90.9 kg)    General: Patient appears comfortable at rest. HEENT: Conjunctiva and lids normal. Neck: Supple, no elevated JVP or carotid bruits. Lungs: Clear to auscultation, nonlabored breathing at rest. Cardiac: Regular rate and rhythm, no S3 or significant systolic murmur. Extremities: No pitting edema.  ECG:  An ECG dated 02/06/2024 was personally reviewed today and demonstrated:  Sinus rhythm with nonspecific ST-T wave changes.  Labwork: 02/06/2024: ALT 15; AST 20; B Natriuretic Peptide 38.0; BUN 13; Creatinine, Ser 1.07; Hemoglobin 11.9; Platelets 198; Potassium 3.5; Sodium 141 05/02/2024: Magnesium  1.8; TSH 2.572     Component Value Date/Time   CHOL 242  (H) 05/02/2024 0934   CHOL 205 (H) 01/20/2022 1433   CHOL 254 07/12/2012 0755   TRIG 194 (H) 05/02/2024 0934   TRIG 325 07/12/2012 0755   HDL 35 (L) 05/02/2024 0934   HDL 26 (L) 01/20/2022 1433   CHOLHDL 6.9 05/02/2024 0934   VLDL 39 05/02/2024 0934   LDLCALC 168 (H) 05/02/2024 0934   LDLCALC 139 (H) 01/20/2022 1433   LDLCALC 156 (H) 12/11/2020 1023   Other Studies Reviewed Today:  Echocardiogram 01/05/2024:  1. Left ventricular ejection fraction, by estimation, is 65 to 70%. The  left ventricle has normal function. The left ventricle has no regional  wall motion abnormalities. Left ventricular diastolic parameters are  consistent with Grade I diastolic  dysfunction (impaired relaxation).   2. Right ventricular systolic function is normal. The right ventricular  size is mildly enlarged.   3. The mitral valve is grossly normal. Trivial mitral valve  regurgitation.   4. The aortic valve is tricuspid. There is mild calcification of the  aortic valve. Aortic valve regurgitation is not visualized.   5. The inferior vena cava is normal in size with greater than 50%  respiratory variability, suggesting right atrial pressure of 3 mmHg.   Assessment and Plan:  1.  History of chest pain with normal coronary arteries documented in 2001, low risk stress testing in follow-up back in 2022, and recent LVEF 65 to 70%.  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  Current regimen includes Ranexa  500 mg twice daily.  2.  Primary hypertension.  Blood pressure control is adequate today.  Continue Avapro  75 mg daily.  3.  Mixed hyperlipidemia.  She has a history of statin intolerance due to myalgias and elevated CK levels.  She is on fenofibrate  145 mg daily.  Recent LDL 168.  Plan to start Zetia  10 mg daily, if tolerated check FLP in 6 months.  Could also consider Nexletol.  She does not want to use any injectable agents (PCSK9 inhibitors).  4.  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  Lifelong anticoagulation recommended.  Continues on Eliquis  5 mg twice daily.  Disposition:  Follow up 6 months.  Signed, Jayson JUDITHANN Sierras, M.D., F.A.C.C. [facility]

## 2024-05-03 ENCOUNTER — Encounter: Payer: Self-pay | Admitting: Orthopaedic Surgery

## 2024-05-03 ENCOUNTER — Ambulatory Visit (INDEPENDENT_AMBULATORY_CARE_PROVIDER_SITE_OTHER): Admitting: Orthopaedic Surgery

## 2024-05-03 DIAGNOSIS — G8929 Other chronic pain: Secondary | ICD-10-CM | POA: Diagnosis not present

## 2024-05-03 DIAGNOSIS — M25562 Pain in left knee: Secondary | ICD-10-CM

## 2024-05-03 DIAGNOSIS — Z7901 Long term (current) use of anticoagulants: Secondary | ICD-10-CM | POA: Diagnosis not present

## 2024-05-03 DIAGNOSIS — M7052 Other bursitis of knee, left knee: Secondary | ICD-10-CM | POA: Diagnosis not present

## 2024-05-03 MED ORDER — HYDROCODONE-ACETAMINOPHEN 5-325 MG PO TABS: ORAL_TABLET | ORAL | 0 refills | Status: DC

## 2024-05-03 MED ORDER — METHYLPREDNISOLONE ACETATE 40 MG/ML IJ SUSP
40.0000 mg | Freq: Once | INTRAMUSCULAR | Status: AC
  Administered 2024-05-03: 40 mg via INTRA_ARTICULAR

## 2024-05-03 NOTE — Progress Notes (Signed)
 My knee is worse.  She has swelling and pain of the left knee as well as pes anserinus bursitis.  The knee swelling is worse. She is on chronic anticoagulation.  She cannot take NSAIDs.  She has no new trauma.  She has no redness. She limps.  The knee does not give way.  The left knee has effusion, crepitus, ROM 0 to 105, limp left, NV intact, stable and has swelling of the pes anserinus bursa as well.    Encounter Diagnoses  Name Primary?   Chronic pain of left knee Yes   Pes anserinus bursitis of left knee    Anticoagulated    PROCEDURE NOTE:  The patient requests injections of the left knee , verbal consent was obtained.  The left knee was prepped appropriately after time out was performed.   Sterile technique was observed and injection of 1 cc of DepoMedrol 40 mg with several cc's of plain xylocaine . Anesthesia was provided by ethyl chloride and a 20-gauge needle was used to inject the knee area. The injection was tolerated well.  A band aid dressing was applied.  The patient was advised to apply ice later today and tomorrow to the injection sight as needed.  I also injected the left pes anserinus bursa with 1% Xylocaine  and 40 mgm Depomedrol by sterile technique tolerated well.  Return in three weeks.  I have reviewed the   Controlled Substance Reporting System web site prior to prescribing narcotic medicine for this patient.  Call if any problem.  Precautions discussed.  Electronically Signed Lemond Stable, MD 7/30/202510:21 AM

## 2024-05-03 NOTE — Addendum Note (Signed)
 Addended by: MARCINE HUSBAND T on: 05/03/2024 02:13 PM   Modules accepted: Orders

## 2024-05-04 ENCOUNTER — Telehealth: Payer: Self-pay | Admitting: "Endocrinology

## 2024-05-04 ENCOUNTER — Telehealth: Payer: Self-pay | Admitting: Orthopaedic Surgery

## 2024-05-04 LAB — PTH, INTACT AND CALCIUM: Calcium, Total (PTH): 9.2 mg/dL (ref 8.7–10.3)

## 2024-05-04 NOTE — Telephone Encounter (Signed)
 Patient called and said that [facility] hasn't received the transport paperwork for August 20th a2 2:20pm

## 2024-05-04 NOTE — Telephone Encounter (Signed)
 I will reach out to [facility] for approval

## 2024-05-04 NOTE — Telephone Encounter (Signed)
 Patient was called and made aware. She states that our office (front staff) always calls transportation for her to get her Lab work done. She ask that they be called , then reach out to her so that she may write the appointment down. She believes that her appointment is on August 5 with Dr. Lenis.

## 2024-05-04 NOTE — Telephone Encounter (Signed)
 [name] approved for us  to have her taken back to the lab this time for a re-draw. She states that if the lab messes up again, she will call the lab for the cost center for them to cover her transportation. I spoke with [employer] and they will take her back to the lab on 8/6 and bring her to Dr Barbette appt on 8/14.

## 2024-05-04 NOTE — Telephone Encounter (Signed)
 [facility] called and said patient needs to have a re collection on her PTH. She said that the patient needs to be made aware. Can you please advise?

## 2024-05-08 NOTE — Telephone Encounter (Signed)
 Never received anything on a transportation paperwork

## 2024-05-09 ENCOUNTER — Ambulatory Visit: Payer: Medicare Other | Admitting: "Endocrinology

## 2024-05-10 ENCOUNTER — Other Ambulatory Visit (HOSPITAL_COMMUNITY)
Admission: RE | Admit: 2024-05-10 | Discharge: 2024-05-10 | Disposition: A | Discharge status: Home / Self Care | Source: Ambulatory Visit | Attending: "Endocrinology | Admitting: "Endocrinology

## 2024-05-11 ENCOUNTER — Telehealth: Payer: Self-pay

## 2024-05-11 LAB — PTH, INTACT AND CALCIUM
Calcium, Total (PTH): 9.7 mg/dL (ref 8.7–10.3)
PTH: 41 pg/mL (ref 15–65)

## 2024-05-11 NOTE — Telephone Encounter (Signed)
 Copied from CRM #[id number]. Topic: Clinical - Medication Question >> May 11, 2024  3:36 PM Santiya F wrote: Reason for CRM: Patient is calling in requesting to speak with someone in the office. Patient says she is having some dental work done on the 29th, and wants to make sure there is no issues getting amoxicillin  prior to the procedure. Patient is going to request the medication from the pharmacy but wanted to make sure it got approved on the provider's end. Patient says she cannot have the procedure unless her provider sends the amoxicillin .

## 2024-05-12 ENCOUNTER — Other Ambulatory Visit: Payer: Self-pay | Admitting: Internal Medicine

## 2024-05-12 DIAGNOSIS — Z792 Long term (current) use of antibiotics: Secondary | ICD-10-CM

## 2024-05-12 MED ORDER — AMOXICILLIN 500 MG PO CAPS: ORAL_CAPSULE | ORAL | 1 refills | Status: AC

## 2024-05-15 ENCOUNTER — Other Ambulatory Visit: Payer: Self-pay | Admitting: "Endocrinology

## 2024-05-15 NOTE — Telephone Encounter (Signed)
Pt informed

## 2024-05-18 ENCOUNTER — Ambulatory Visit (INDEPENDENT_AMBULATORY_CARE_PROVIDER_SITE_OTHER): Admitting: "Endocrinology

## 2024-05-18 ENCOUNTER — Encounter: Payer: Self-pay | Admitting: "Endocrinology

## 2024-05-18 DIAGNOSIS — E782 Mixed hyperlipidemia: Secondary | ICD-10-CM | POA: Diagnosis not present

## 2024-05-18 DIAGNOSIS — R7303 Prediabetes: Secondary | ICD-10-CM | POA: Diagnosis not present

## 2024-05-18 DIAGNOSIS — E559 Vitamin D deficiency, unspecified: Secondary | ICD-10-CM | POA: Insufficient documentation

## 2024-05-18 NOTE — Progress Notes (Signed)
 05/18/2024, 1:43 PM  Endocrinology follow-up note   Subjective:    Patient ID: [name], female    DOB: 10/15/1950, PCP Tobie Suzzane POUR, MD   Past Medical History:  Diagnosis Date   Anal fissure    Anxiety    Arthritis    Asthma    C. difficile diarrhea 09/17/2021   Cataract    Phreesia 01/05/2021   Chest pain    a. 02/2000 Cath: nl cors, EF 60%;  b. 10/2010 MV: nl LV, no ischemia/infarct;  c. 03/2014 Admit c/p, r/o->grief from husbands death.   Chronic RUQ pain 06-12-08   EUS slightly dilated CBD (7.64mm), otherwise nl   Depression    Diverticulosis    Eczema    Exposure to chemical inhalation mid 1970s   Fatty liver    G6PD deficiency    Gallstones    GERD (gastroesophageal reflux disease)    Glaucoma    History of pulmonary embolus (PE)    Treated with Eliquis  13-Jun-2015   HTN (hypertension)    Hypercholesteremia    a. intolerant to statins.   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 She has limited vision in the right eye.   Migraines    inner ocular   Ocular migraine    Palpitations    Pleurisy    Pneumonia 07/16/2021   Patient was exposed to a harsh chemical in the 1970's that created scar tissue in her lungs. She has had pneumonia several times in the past.   PONV (postoperative nausea and vomiting)    Pulmonary emboli (HCC)    Recurrent upper respiratory infection (URI)    Renal cyst    Retinal cyst    Sickle cell trait (HCC)    Tubular adenoma of colon 09/17/2011   Urticaria    Past Surgical History:  Procedure Laterality Date   ABDOMINAL HYSTERECTOMY     ADENOIDECTOMY     ANGIOPLASTY     APPENDECTOMY     BIOPSY N/A 03/14/2013   Procedure: SMALL BOWEL AND GASTRIC BIOPSIES (Procedure #1);  Surgeon: Margo LITTIE Haddock, MD;  Location: [facility];  Service: Endoscopy;  Laterality: N/A;   CARDIAC CATHETERIZATION Left  02/11/2000   CATARACT EXTRACTION Left    CATARACT EXTRACTION W/PHACO Right 03/19/2021   Procedure: CATARACT EXTRACTION PHACO AND INTRAOCULAR LENS PLACEMENT (IOC) RIGHT 6.75 00:50.4;  Surgeon: Mittie Gaskin, MD;  Location: [facility];  Service: Ophthalmology;  Laterality: Right;   CHOLECYSTECTOMY  12/2007   COLONOSCOPY  12/22/2010   MFM:[id number], cecal adenomatous polyp   COLONOSCOPY WITH PROPOFOL  N/A 03/31/2016   Dr. Haddock: four sessile polyps rectum/sigmoid colon, diverticulosi, ext/int hemorrhoids. hyperplastic polyps, next tcs 5 years.   COLONOSCOPY WITH PROPOFOL  N/A 04/21/2021   anal fissure, non-bleeding internal hemorrhoids, right colon diverticulosis, one 2 mm polyp in ascending, three 4-6 mm polyps in descending colon. Tubular adenomas. 5 year surveillance.   complete hysterectomy     ENTEROSCOPY N/A 03/14/2013   DOQ:fpoi gastritis/ulcers has healed   ESOPHAGOGASTRODUODENOSCOPY  09/22/2011   DOQ:Fpoi gastritis/Duodenitis   EXCISIONAL HEMORRHOIDECTOMY     FLEXIBLE  SIGMOIDOSCOPY N/A 03/14/2013   SLF:3 colon polyp removed/moderate sized internal hemorrhoids   FLEXIBLE SIGMOIDOSCOPY N/A 06/09/2016   Procedure: FLEXIBLE SIGMOIDOSCOPY;  Surgeon: Margo LITTIE Haddock, MD;  Location: [facility];  Service: Endoscopy;  Laterality: N/A;  rectal polyps times 2   FLEXIBLE SIGMOIDOSCOPY N/A 03/18/2018   Procedure: FLEXIBLE SIGMOIDOSCOPY;  Surgeon: Haddock Margo LITTIE, MD;  Location: [facility];  Service: Endoscopy;  Laterality: N/A;  9:15am   FOOT SURGERY     bunion removal left   GIVENS CAPSULE STUDY N/A 02/10/2013   Procedure: GIVENS CAPSULE STUDY;  Surgeon: Margo LITTIE Haddock, MD;  Location: [facility];  Service: Endoscopy;  Laterality: N/A;  730   HEEL SPUR EXCISION     right    HEMORRHOID BANDING N/A 03/14/2013   Procedure: HEMORRHOID BANDING (Procedure #3)  3 bands applied Onu#[id number] Exp 02/02/2014 ;  Surgeon: Margo LITTIE Haddock, MD;  Location: [facility];  Service: Endoscopy;   Laterality: N/A;   HEMORRHOID SURGERY N/A 04/24/2016   Procedure: EXTENSIVE HEMORRHOIDECTOMY;  Surgeon: Selinda Artist Moats, MD;  Location: [facility];  Service: General;  Laterality: N/A;   LAPAROSCOPY     adhesions   POLYPECTOMY N/A 03/14/2013   DOQ:FPOI Gastritis . ULCERS SEEN ON MAY 6 HAVE HEALED   POLYPECTOMY  03/31/2016   Procedure: POLYPECTOMY;  Surgeon: Margo LITTIE Haddock, MD;  Location: [facility];  Service: Endoscopy;;  sigmoid colon polyps x2, rectal polyps x2   POLYPECTOMY  04/21/2021   Procedure: POLYPECTOMY;  Surgeon: Cindie Carlin POUR, DO;  Location: [facility];  Service: Endoscopy;;   RECTAL EXAM UNDER ANESTHESIA N/A 07/23/2021   Procedure: RECTAL EXAM UNDER ANESTHESIA;  Surgeon: Debby Hila, MD;  Location: [facility];  Service: General;  Laterality: N/A;   SPHINCTEROTOMY N/A 07/23/2021   Procedure: CHEMICAL SPHINCTEROTOMY;  Surgeon: Debby Hila, MD;  Location: [facility];  Service: General;  Laterality: N/A;   TONSILLECTOMY     TUBAL LIGATION     Social History   Socioeconomic History   Marital status: Widowed    Spouse name: Not on file   Number of children: 1   Years of education: Not on file   Highest education level: Not on file  Occupational History   Occupation: homemaker    Employer: UNEMPLOYED  Tobacco Use   Smoking status: Former    Current packs/day: 0.00    Average packs/day: 0.5 packs/day for 30.0 years (15.0 ttl pk-yrs)    Types: Cigarettes    Start date: 10/15/1966    Quit date: 10/15/1996    Years since quitting: 27.6   Smokeless tobacco: Never   Tobacco comments:    Stopped smoking ~ 2000  Vaping Use   Vaping status: Never Used  Substance and Sexual Activity   Alcohol use: Not Currently    Alcohol/week: 4.0 standard drinks of alcohol    Types: 4 Glasses of wine per week    Comment: 1 drinks every couple of weeks   Drug use: No   Sexual activity: Not Currently    Birth control/protection: Surgical  Other  Topics Concern   Not on file  Social History Narrative   Does not routinely exercise. Husband passed in JUN 2015 due to prostate ca.   Social Drivers of Corporate investment banker Strain: Low Risk  (06/11/2023)   Overall Financial Resource Strain (CARDIA)    Difficulty of Paying Living Expenses: Not hard at all  Food Insecurity: No Food Insecurity (  02/06/2024)   Hunger Vital Sign    Worried About Running Out of Food in the Last Year: Never true    Ran Out of Food in the Last Year: Never true  Transportation Needs: Unmet Transportation Needs (04/26/2024)   PRAPARE - Transportation    Lack of Transportation (Medical): Yes    Lack of Transportation (Non-Medical): Yes  Physical Activity: Sufficiently Active (06/11/2023)   Exercise Vital Sign    Days of Exercise per Week: 6 days    Minutes of Exercise per Session: 30 min  Stress: No Stress Concern Present (06/11/2023)   [employer] of Occupational Health - Occupational Stress Questionnaire    Feeling of Stress : Not at all  Social Connections: Socially Isolated (02/06/2024)   Social Connection and Isolation Panel    Frequency of Communication with Friends and Family: Three times a week    Frequency of Social Gatherings with Friends and Family: Once a week    Attends Religious Services: Never    Database administrator or Organizations: No    Attends Banker Meetings: Never    Marital Status: Widowed   Family History  Problem Relation Age of Onset   Colon cancer Mother        8s   Urticaria Mother    Heart disease Mother    Colon polyps Mother    Cancer Father        oral   Crohn's disease Sister    Cancer Sister        around heart   Colon polyps Sister    Diabetes Brother    Other Brother        intestinal sugery   Colon polyps Brother    Colon cancer Maternal Grandmother    Colon cancer Maternal Grandfather    Colon cancer Paternal Grandfather    Anesthesia problems Neg Hx    Outpatient Encounter  Medications as of 05/18/2024  Medication Sig   albuterol  (PROVENTIL ) (2.5 MG/3ML) 0.083% nebulizer solution Take 2.5 mg by nebulization every 6 (six) hours as needed for wheezing or shortness of breath.   albuterol  (VENTOLIN  HFA) 108 (90 Base) MCG/ACT inhaler Inhale into the lungs every 6 (six) hours as needed for wheezing or shortness of breath.   ALPRAZolam  (XANAX ) 1 MG tablet Take 1 mg by mouth at bedtime as needed for anxiety. Takes 1 a day if needed   amoxicillin  (AMOXIL ) 500 MG capsule Take 4 capsules at once by mouth 1 hour prior to the dental procedure.   apixaban  (ELIQUIS ) 5 MG TABS tablet Take 1 tablet (5 mg total) by mouth 2 (two) times daily.   brimonidine  (ALPHAGAN ) 0.2 % ophthalmic solution Place 1 drop into both eyes 2 (two) times daily.   chlorpheniramine-HYDROcodone  (TUSSIONEX) 10-8 MG/5ML Take 5 mLs by mouth.   Cholecalciferol  (VITAMIN D3) 50 MCG (2000 UT) TABS Take 2,000 Units by mouth daily.   dexlansoprazole  (DEXILANT ) 60 MG capsule Take 1 capsule (60 mg total) by mouth daily.   diltiazem 2 % GEL Apply 1 Application topically 2 (two) times daily.   estradiol  (ESTRACE ) 0.1 MG/GM vaginal cream Place 1 Applicatorful vaginally at bedtime.   ezetimibe  (ZETIA ) 10 MG tablet Take 1 tablet (10 mg total) by mouth daily. (Patient not taking: Reported on 05/03/2024)   fenofibrate  (TRICOR ) 145 MG tablet Take 1 tablet (145 mg total) by mouth daily.   HYDROcodone -acetaminophen  (NORCO/VICODIN) 5-325 MG tablet One tablet by mouth every six hours as needed for pain.  Seven day  limit   irbesartan  (AVAPRO ) 150 MG tablet Take 150 mg by mouth daily. Takes 75mg  in morning   latanoprost  (XALATAN ) 0.005 % ophthalmic solution Place 1 drop into both eyes at bedtime.   lidocaine  (XYLOCAINE ) 5 % ointment Apply 1 Application topically as needed.   Magnesium  500 MG TABS Take 1 tablet by mouth in the morning and at bedtime. Taking 1 in he morning, afternoon, and at bedtime   meclizine  (ANTIVERT ) 12.5 MG  tablet Take 12.5 mg by mouth 3 (three) times daily as needed for dizziness.   metroNIDAZOLE  (METROCREAM ) 0.75 % cream Apply topically 2 (two) times daily.   mirtazapine  (REMERON ) 7.5 MG tablet Take 1 tablet (7.5 mg total) by mouth at bedtime.   nitrofurantoin , macrocrystal-monohydrate, (MACROBID ) 100 MG capsule Take 1 capsule (100 mg total) by mouth 2 (two) times daily.   nitroGLYCERIN  (NITROLINGUAL ) 0.4 MG/SPRAY spray Place 1 spray under the tongue every 5 (five) minutes as needed for chest pain.   Nitroglycerin  0.4 % OINT Place rectally.   ondansetron  (ZOFRAN ) 4 MG tablet TAKE ONE TABLET BY MOUTH EVERY 8 HOURS AS NEEDED FOR NAUSEA OR VOMITING   predniSONE  (DELTASONE ) 20 MG tablet Take 2 tablets (40 mg total) by mouth daily with breakfast.   Probiotic Product (PROBIOTIC DAILY PO) Take 1 capsule by mouth daily. VSL #3   ranolazine  (RANEXA ) 500 MG 12 hr tablet Take 1 tablet (500 mg total) by mouth 2 (two) times daily.   rizatriptan  (MAXALT -MLT) 10 MG disintegrating tablet DISSOLVE ONE TABLET ON TONGUE AS NEEDED FOR MIGRAINE. MAY REPEAT IN 2 HOURS IF NEEDED   XIIDRA  5 % SOLN Apply 1 drop to eye at bedtime.   zolpidem  (AMBIEN ) 10 MG tablet Take 1 tablet (10 mg total) by mouth at bedtime as needed for sleep.   No facility-administered encounter medications on file as of 05/18/2024.   ALLERGIES: Allergies  Allergen Reactions   Nutmeg Oil (Myristica Oil) Anaphylaxis    Nut oil- skin irritation   Adhesive [Tape] Other (See Comments)    Blisters skin   Aspirin Other (See Comments)    Sickle cell trait--  Not recommended unless emergency   Imitrex  [Sumatriptan ] Hives   Levaquin  [Levofloxacin  In D5w] Other (See Comments)    Altered mental status Affects G6PD   Statins Other (See Comments)    Myopathy - elevated CK   Keflex  [Cephalexin ] Other (See Comments)    G6PD   Latex Rash   Sulfa Antibiotics Other (See Comments)    Patient has sickle cell trait    VACCINATION STATUS: Immunization  History  Administered Date(s) Administered    sv, Bivalent, Protein Subunit Rsvpref,pf (Abrysvo) 06/30/2022   Fluad Quad(high Dose 65+) 06/05/2019, 07/22/2020, 06/16/2021, 06/30/2022   Fluad Trivalent(High Dose 65+) 06/03/2023   Influenza Split 07/12/2012   Influenza,inj,Quad PF,6+ Mos 08/03/2014, 07/19/2015, 08/11/2016, 06/24/2017, 07/04/2018   Moderna Covid-19 Vaccine  Bivalent Booster 23yrs & up 01/31/2021, 06/30/2021, 01/30/2022   Moderna SARS-COV2 Booster Vaccination 06/30/2022   Moderna Sars-Covid-2 Vaccination 11/16/2019, 12/15/2019, 08/02/2020   Pneumococcal Conjugate-13 02/15/2018   Pneumococcal Polysaccharide-23 08/03/2014, 01/12/2020   Tdap 08/25/2012, 08/03/2014   Unspecified SARS-COV-2 Vaccination 07/07/2023   Zoster Recombinant(Shingrix ) 01/16/2020, 03/21/2020   Zoster, Live 08/25/2012    HPI  Hortence Charter Vanbuskirk is 73 y.o. female who presents today with a medical history as above. she is being seen in follow-up  for history of hypercalcemia, hypothyroidism, hypomagnesemia.   - She presents for follow-up with previsit labs.  She has no new complaints today. She  is also on vitamin D  deficiency.  She is  on regular supplement of magnesium  500 milligrams p.o. twice daily.     She continues to have stable calcium level after she was advised to discontinue hydrochlorothiazide .   -She continues to have severe dyslipidemia, did not tolerate statins.  Her cardiologist prescribed ezetimibe  for her to take in addition to her Tricor .  She has not started this medication yet.  She has not changed her diet significantly.  She denies any prior history of parathyroid/pituitary/adrenal dysfunction. -She does have a family history of some thyroid  dysfunction in her cousins.   She is on multiple vitamin supplements/medications including inhalers for asthma.  Review of Systems Limited as above.  Objective:       05/18/2024    9:49 AM 05/02/2024    3:55 PM 05/01/2024    1:06 PM  Vitals  with BMI  Height 5' 7 5' 7 5' 7  Weight 208 lbs 203 lbs 3 oz 202 lbs  BMI 32.57 31.82 31.63  Systolic 132 132 862  Diastolic 74 80 84  Pulse 64 60 83    BP 132/74   Pulse 64   Ht 5' 7 (1.702 m)   Wt 208 lb (94.3 kg)   BMI 32.58 kg/m   Wt Readings from Last 3 Encounters:  05/18/24 208 lb (94.3 kg)  05/02/24 203 lb 3.2 oz (92.2 kg)  05/01/24 202 lb (91.6 kg)    Physical Exam    CMP ( most recent) CMP     Component Value Date/Time   NA 141 02/06/2024 1232   NA 141 11/17/2023 0934   K 3.5 02/06/2024 1232   K 4.3 07/12/2012 0755   CL 107 02/06/2024 1232   CL 100 07/12/2012 0755   CO2 25 02/06/2024 1232   GLUCOSE 127 (H) 02/06/2024 1232   BUN 13 02/06/2024 1232   BUN 16 11/17/2023 0934   CREATININE 1.07 (H) 02/06/2024 1232   CREATININE 1.23 (H) 12/11/2020 1023   CALCIUM 9.7 05/10/2024 0942   PROT 7.0 02/06/2024 1232   PROT 6.7 02/01/2023 1411   PROT 8.1 07/12/2012 0755   ALBUMIN 3.9 02/06/2024 1232   ALBUMIN 4.5 02/01/2023 1411   AST 20 02/06/2024 1232   AST 42 07/12/2012 0755   ALT 15 02/06/2024 1232   ALKPHOS 44 02/06/2024 1232   ALKPHOS 32 07/12/2012 0755   BILITOT 0.7 02/06/2024 1232   BILITOT 0.6 02/01/2023 1411   BILITOT 1.0 07/12/2012 0755   GFRNONAA 56 (L) 02/06/2024 1232   GFRNONAA 36 (L) 07/22/2020 1441   GFRAA 41 (L) 07/22/2020 1441     Diabetic Labs (most recent): Lab Results  Component Value Date   HGBA1C 5.1 (A) 11/10/2023   HGBA1C 5.6 02/01/2023   HGBA1C 5.2 01/20/2022   MICROALBUR 16.6 01/04/2017     Lipid Panel ( most recent) Lipid Panel     Component Value Date/Time   CHOL 242 (H) 05/02/2024 0934   CHOL 205 (H) 01/20/2022 1433   CHOL 254 07/12/2012 0755   TRIG 194 (H) 05/02/2024 0934   TRIG 325 07/12/2012 0755   HDL 35 (L) 05/02/2024 0934   HDL 26 (L) 01/20/2022 1433   CHOLHDL 6.9 05/02/2024 0934   VLDL 39 05/02/2024 0934   LDLCALC 168 (H) 05/02/2024 0934   LDLCALC 139 (H) 01/20/2022 1433   LDLCALC 156 (H) 12/11/2020  1023   LABVLDL 40 01/20/2022 1433      Lab Results  Component Value Date   TSH  2.572 05/02/2024   TSH 1.393 11/03/2023   TSH 2.855 04/28/2023   TSH 4.580 (H) 02/01/2023   TSH 2.248 10/28/2022   TSH 2.306 04/27/2022   TSH 2.200 09/08/2021   TSH 1.400 03/24/2021   TSH 1.770 09/23/2020   TSH 1.710 05/24/2020   FREET4 0.78 05/02/2024   FREET4 1.20 (H) 11/03/2023   FREET4 0.96 04/28/2023   FREET4 1.08 02/01/2023   FREET4 1.19 (H) 10/28/2022   FREET4 1.04 04/27/2022   FREET4 1.22 09/08/2021   FREET4 1.36 03/24/2021   FREET4 1.46 09/23/2020   FREET4 1.31 05/24/2020      Assessment & Plan:   1. Hypercalcemia- fluctuating 2.  Hypomagnesemia  3.  Hyperlipidemia    -She presents with normocalemia with normal albumin.   Her current calcium level has stabilized at 9.7 associated with normal PTH,.  Her magnesium  has also corrected currently on magnesium  500 mg p.o. 3 times daily with meals.   She is advised to continue magnesium  supplements.  Regarding hypothyroidism -Her previsit thyroid  function tests are consistent with slight over replacement.  Her recent weight loss has probably resolved this problem.  She is advised to discontinue levothyroxine  for now.   -Decision will be made after her next thyroid  function test.  -Her neck exam is negative for goiter, no need for thyroid  imaging for now.   She has dyslipidemia, obesity, statin intolerance.  She remains on Tricor  145 mg p.o. daily, and recently given the prescription for Zetia , did not start it yet.  She is encouraged to start the medication.  She was offered PCSK9 inhibitor intervention, however she declines this option for now.  In light of the fact that she does not tolerate anti-TB medications and would like to avoid further medication escalation, plant-based diet would have been a good option also, however this patient did not engage with lifestyle medicine nutrition as well.  - she is advised to maintain close follow up  with Tobie Suzzane POUR, MD for primary care needs.  I spent  25  minutes in the care of the patient today including review of labs from Thyroid  Function, CMP, and other relevant labs ; imaging/biopsy records (current and previous including abstractions from other facilities); face-to-face time discussing  her lab results and symptoms, medications doses, her options of short and long term treatment based on the latest standards of care / guidelines;   and documenting the encounter.  Shifra Swartzentruber Szeto  participated in the discussions, expressed understanding, and voiced agreement with the above plans.  All questions were answered to her satisfaction. she is encouraged to contact clinic should she have any questions or concerns prior to her return visit.   Follow up plan: Return in about 6 months (around 11/18/2024) for Fasting Labs  in AM B4 8.   Ranny Earl, MD [facility] [address] [place], [place] [address] Phone: [phone]  Fax: [phone]     05/18/2024, 1:43 PM  This note was partially dictated with voice recognition software. Similar sounding words can be transcribed inadequately or may not  be corrected upon review.

## 2024-05-22 ENCOUNTER — Telehealth: Payer: Self-pay

## 2024-05-22 ENCOUNTER — Other Ambulatory Visit: Payer: Self-pay | Admitting: Internal Medicine

## 2024-05-22 DIAGNOSIS — Z792 Long term (current) use of antibiotics: Secondary | ICD-10-CM

## 2024-05-22 NOTE — Telephone Encounter (Signed)
 Copied from CRM #[id number]. Topic: Clinical - Prescription Issue >> May 22, 2024  4:23 PM Selinda RAMAN wrote: Reason for CRM: The patient called in stating [facility] by mail never received the fax to refill her dexlansoprazole  (DEXILANT ) 60 MG capsule. She said it is the right fax number of [phone] but sometimes because they are so busy they don't receive it. She said it is ok to even fax it a couple of times to make sure they receive it. Please assist patient further.

## 2024-05-23 ENCOUNTER — Other Ambulatory Visit: Payer: Self-pay

## 2024-05-23 DIAGNOSIS — K219 Gastro-esophageal reflux disease without esophagitis: Secondary | ICD-10-CM

## 2024-05-23 MED ORDER — DEXLANSOPRAZOLE 60 MG PO CPDR: 60.0000 mg | DELAYED_RELEASE_CAPSULE | Freq: Every day | ORAL | 3 refills | Status: DC

## 2024-05-23 NOTE — Telephone Encounter (Signed)
 Spoke to pt , I printed rx and faxed to [facility].

## 2024-05-24 ENCOUNTER — Other Ambulatory Visit (INDEPENDENT_AMBULATORY_CARE_PROVIDER_SITE_OTHER)

## 2024-05-24 ENCOUNTER — Ambulatory Visit (INDEPENDENT_AMBULATORY_CARE_PROVIDER_SITE_OTHER): Admitting: Orthopaedic Surgery

## 2024-05-24 ENCOUNTER — Encounter: Payer: Self-pay | Admitting: Orthopaedic Surgery

## 2024-05-24 VITALS — BP 127/82 | HR 91 | Ht 67.0 in | Wt 201.0 lb

## 2024-05-24 DIAGNOSIS — M25511 Pain in right shoulder: Secondary | ICD-10-CM | POA: Diagnosis not present

## 2024-05-24 DIAGNOSIS — M25562 Pain in left knee: Secondary | ICD-10-CM

## 2024-05-24 DIAGNOSIS — G8929 Other chronic pain: Secondary | ICD-10-CM

## 2024-05-24 DIAGNOSIS — Z7901 Long term (current) use of anticoagulants: Secondary | ICD-10-CM

## 2024-05-24 NOTE — Progress Notes (Signed)
 My shoulder hurts  She hurt her right shoulder in March, 2025 and it has been bothering her somewhat.  She wants an xray done.  Her left knee is improved after the injection last time.  She is walking better.  ROM of the right shoulder is full but tender in the extremes.  NV intact.  ROM of the left knee is nearly full, no limp, stable, slight effusion and slight crepitus, NV intact.  X-rays were done of the right shoulder, reported separately.  Encounter Diagnoses  Name Primary?   Chronic right shoulder pain Yes   Chronic pain of left knee    Anticoagulated    She declines injection in the shoulder.  I have informed the patient I will be retiring from medical practice and from this office on July 06, 2024.  The patient has been offered continuing care with Dr. Margrette or Dr. Onesimo of this office.  The patient may choose another provider and the records will be forwarded after proper signature and notification.  Patient understands and agrees.  I will see her prn.  Call if any problem.  Precautions discussed.  Electronically Signed Lemond Stable, MD 8/20/20251:49 PM

## 2024-05-26 ENCOUNTER — Encounter: Payer: Self-pay | Admitting: Radiology

## 2024-06-07 ENCOUNTER — Encounter: Payer: Self-pay | Admitting: Internal Medicine

## 2024-06-07 ENCOUNTER — Ambulatory Visit (INDEPENDENT_AMBULATORY_CARE_PROVIDER_SITE_OTHER): Admitting: Internal Medicine

## 2024-06-07 VITALS — BP 137/75 | HR 96 | Ht 67.0 in | Wt 202.6 lb

## 2024-06-07 DIAGNOSIS — R739 Hyperglycemia, unspecified: Secondary | ICD-10-CM | POA: Diagnosis not present

## 2024-06-07 DIAGNOSIS — M159 Polyosteoarthritis, unspecified: Secondary | ICD-10-CM | POA: Diagnosis not present

## 2024-06-07 DIAGNOSIS — E039 Hypothyroidism, unspecified: Secondary | ICD-10-CM | POA: Diagnosis not present

## 2024-06-07 DIAGNOSIS — I10 Essential (primary) hypertension: Secondary | ICD-10-CM | POA: Diagnosis not present

## 2024-06-07 DIAGNOSIS — K219 Gastro-esophageal reflux disease without esophagitis: Secondary | ICD-10-CM | POA: Diagnosis not present

## 2024-06-07 DIAGNOSIS — M51372 Other intervertebral disc degeneration, lumbosacral region with discogenic back pain and lower extremity pain: Secondary | ICD-10-CM | POA: Diagnosis not present

## 2024-06-07 DIAGNOSIS — K582 Mixed irritable bowel syndrome: Secondary | ICD-10-CM

## 2024-06-07 DIAGNOSIS — E782 Mixed hyperlipidemia: Secondary | ICD-10-CM

## 2024-06-07 DIAGNOSIS — N1831 Chronic kidney disease, stage 3a: Secondary | ICD-10-CM | POA: Insufficient documentation

## 2024-06-07 DIAGNOSIS — F5104 Psychophysiologic insomnia: Secondary | ICD-10-CM

## 2024-06-07 DIAGNOSIS — Z23 Encounter for immunization: Secondary | ICD-10-CM

## 2024-06-07 MED ORDER — ZOLPIDEM TARTRATE 10 MG PO TABS: 10.0000 mg | ORAL_TABLET | Freq: Every evening | ORAL | 1 refills | Status: AC | PRN

## 2024-06-07 MED ORDER — HYDROCODONE-ACETAMINOPHEN 5-325 MG PO TABS: 1.0000 | ORAL_TABLET | Freq: Four times a day (QID) | ORAL | 0 refills | Status: DC | PRN

## 2024-06-07 MED ORDER — METHYLPREDNISOLONE 4 MG PO TBPK: ORAL_TABLET | ORAL | 0 refills | Status: DC

## 2024-06-07 MED ORDER — FENOFIBRATE 145 MG PO TABS: 145.0000 mg | ORAL_TABLET | Freq: Every day | ORAL | 3 refills | Status: DC

## 2024-06-07 NOTE — Patient Instructions (Addendum)
 Please start taking Mirtazepine as prescribed for insomnia and improving appetite.  Please take Prednisone  as prescribed for acute back pain. Please take Norco only as needed for severe pain.  Please continue to take medications as prescribed.  Please continue to follow low carb diet and ambulate as tolerated.

## 2024-06-07 NOTE — Assessment & Plan Note (Signed)
 On fenofibrate  and Zetia  Does not tolerate statin Check lipid profile

## 2024-06-07 NOTE — Assessment & Plan Note (Signed)
 Lab Results  Component Value Date   TSH 2.572 05/02/2024   Was on Levothyroxine  25 mcg once daily, recently discontinued due to elevated free T4 Follows up with Dr Lenis Checked TSH and free T4

## 2024-06-07 NOTE — Assessment & Plan Note (Signed)
 Usually well-controlled, recently worse due to multiple office visits and increasing stress due to medical conditions Takes Ambien  10 mg qHS PRN Has Xanax  1 mg as needed for anxiety/panic episodes, she did not take Ambien  and Xanax  together Advised to hold Xanax  for now Had started Remeron  7.5 mg nightly for insomnia and to improve appetite, but has not started it yet, agrees to start it

## 2024-06-07 NOTE — Assessment & Plan Note (Signed)
 Overall well-controlled currently Has stopped taking Dexilant  since C. Diff. Colitis Avoid hot and spicy food

## 2024-06-07 NOTE — Assessment & Plan Note (Signed)
 Avoid heavy lifting and frequent bending Advised to use back brace and/or heating pad as needed Has been evaluated by orthopedic surgeon - Norco as needed for severe pain Unable to take oral NSAIDs Medrol  Dosepak for acute low back pain

## 2024-06-07 NOTE — Progress Notes (Signed)
 Established Patient Office Visit  Subjective:  Patient ID: [name], female    DOB: 29-Apr-1951  Age: 73 y.o. MRN: [id number]  CC:  Chief Complaint  Patient presents with   Hypertension    4 month f/u    Asthma    4 month f/u    Back Pain    Reports sx of back pain.     HPI [name] is a 73 y.o. female with past medical history of HTN, GERD, IBS-C, NASH, anal fissure, hypothyroidism, OA, DDD of lumbar spine, anxiety/panic disorder, insomnia, left sided blind eye, G6PD mutation and sickle cell trait who presents for f/u of her chronic medical conditions.  HTN: BP is well-controlled. She takes Irbesartan  75 mg QD. Patient denies dizziness, chest pain, dyspnea or palpitations.  Recurrent PE: She is taking Eliquis  5 mg twice daily currently.  Denies any signs of active bleeding.  Has been evaluated by Hematology clinic for it.  Asthma: She has felt better recently. She had to stop Trelegy due to cough after hospitalization. Denies any dyspnea or wheezing currently. Denies any fever or chills.  She has noticed an improvement in daily headache recently, which was generalized, and worse with stress. She takes Maxalt  as needed for migraine.  She had neurology evaluation and was given gabapentin , but she did not try it after reading its side effects.  She has tried Tylenol  for it with some relief. Denies any focal numbness or tingling. She has Xanax  for severe anxiety/panic episode, but does not require it frequently.  She denies anhedonia, SI or HI. She takes Ambien  for insomnia ,but still reports insomnia recently.  She has history of DDD of lumbosacral spine. She reports worsening of right sided back pain for the last 1 week.  She has tried taking Norco for severe pain with relief.  Denies any recent injury or fall.  Denies saddle anesthesia, urinary or stool incontinence.  She has very limited vision in the right eye after cataract surgery (tunnel vision).  She has been having  difficulty ambulating even at home, but denies any recent fall.  She has recently noticed peripheral vision loss and has gray spot in the center.  She is followed by ophthalmology.  Denies any eye pain or discharge currently.    Past Medical History:  Diagnosis Date   Anal fissure    Anxiety    Arthritis    Asthma    C. difficile diarrhea 09/17/2021   Cataract    Phreesia 01/05/2021   Chest pain    a. 02/2000 Cath: nl cors, EF 60%;  b. 10/2010 MV: nl LV, no ischemia/infarct;  c. 03/2014 Admit c/p, r/o->grief from husbands death.   Chronic RUQ pain July 02, 2008   EUS slightly dilated CBD (7.58mm), otherwise nl   Depression    Diverticulosis    Eczema    Exposure to chemical inhalation mid 1970s   Fatty liver    G6PD deficiency    Gallstones    GERD (gastroesophageal reflux disease)    Glaucoma    History of pulmonary embolus (PE)    Treated with Eliquis  2015/07/03   HTN (hypertension)    Hypercholesteremia    a. intolerant to statins.   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 She has limited vision in the right eye.   Migraines    inner ocular  Ocular migraine    Palpitations    Pleurisy    Pneumonia 07/16/2021   Patient was exposed to a harsh chemical in the 1970's that created scar tissue in her lungs. She has had pneumonia several times in the past.   PONV (postoperative nausea and vomiting)    Pulmonary emboli (HCC)    Recurrent upper respiratory infection (URI)    Renal cyst    Retinal cyst    Sickle cell trait (HCC)    Tubular adenoma of colon 09/17/2011   Urticaria     Past Surgical History:  Procedure Laterality Date   ABDOMINAL HYSTERECTOMY     ADENOIDECTOMY     ANGIOPLASTY     APPENDECTOMY     BIOPSY N/A 03/14/2013   Procedure: SMALL BOWEL AND GASTRIC BIOPSIES (Procedure #1);  Surgeon: Margo LITTIE Haddock, MD;  Location: [facility];  Service: Endoscopy;  Laterality: N/A;   CARDIAC  CATHETERIZATION Left 02/11/2000   CATARACT EXTRACTION Left    CATARACT EXTRACTION W/PHACO Right 03/19/2021   Procedure: CATARACT EXTRACTION PHACO AND INTRAOCULAR LENS PLACEMENT (IOC) RIGHT 6.75 00:50.4;  Surgeon: Mittie Gaskin, MD;  Location: [facility];  Service: Ophthalmology;  Laterality: Right;   CHOLECYSTECTOMY  12/2007   COLONOSCOPY  12/22/2010   MFM:[id number], cecal adenomatous polyp   COLONOSCOPY WITH PROPOFOL  N/A 03/31/2016   Dr. Haddock: four sessile polyps rectum/sigmoid colon, diverticulosi, ext/int hemorrhoids. hyperplastic polyps, next tcs 5 years.   COLONOSCOPY WITH PROPOFOL  N/A 04/21/2021   anal fissure, non-bleeding internal hemorrhoids, right colon diverticulosis, one 2 mm polyp in ascending, three 4-6 mm polyps in descending colon. Tubular adenomas. 5 year surveillance.   complete hysterectomy     ENTEROSCOPY N/A 03/14/2013   DOQ:fpoi gastritis/ulcers has healed   ESOPHAGOGASTRODUODENOSCOPY  09/22/2011   DOQ:Fpoi gastritis/Duodenitis   EXCISIONAL HEMORRHOIDECTOMY     FLEXIBLE SIGMOIDOSCOPY N/A 03/14/2013   SLF:3 colon polyp removed/moderate sized internal hemorrhoids   FLEXIBLE SIGMOIDOSCOPY N/A 06/09/2016   Procedure: FLEXIBLE SIGMOIDOSCOPY;  Surgeon: Margo LITTIE Haddock, MD;  Location: [facility];  Service: Endoscopy;  Laterality: N/A;  rectal polyps times 2   FLEXIBLE SIGMOIDOSCOPY N/A 03/18/2018   Procedure: FLEXIBLE SIGMOIDOSCOPY;  Surgeon: Haddock Margo LITTIE, MD;  Location: [facility];  Service: Endoscopy;  Laterality: N/A;  9:15am   FOOT SURGERY     bunion removal left   GIVENS CAPSULE STUDY N/A 02/10/2013   Procedure: GIVENS CAPSULE STUDY;  Surgeon: Margo LITTIE Haddock, MD;  Location: [facility];  Service: Endoscopy;  Laterality: N/A;  730   HEEL SPUR EXCISION     right    HEMORRHOID BANDING N/A 03/14/2013   Procedure: HEMORRHOID BANDING (Procedure #3)  3 bands applied Onu#[id number] Exp 02/02/2014 ;  Surgeon: Margo LITTIE Haddock, MD;  Location: [facility];   Service: Endoscopy;  Laterality: N/A;   HEMORRHOID SURGERY N/A 04/24/2016   Procedure: EXTENSIVE HEMORRHOIDECTOMY;  Surgeon: Selinda Artist Moats, MD;  Location: [facility];  Service: General;  Laterality: N/A;   LAPAROSCOPY     adhesions   POLYPECTOMY N/A 03/14/2013   DOQ:FPOI Gastritis . ULCERS SEEN ON MAY 6 HAVE HEALED   POLYPECTOMY  03/31/2016   Procedure: POLYPECTOMY;  Surgeon: Margo LITTIE Haddock, MD;  Location: [facility];  Service: Endoscopy;;  sigmoid colon polyps x2, rectal polyps x2   POLYPECTOMY  04/21/2021   Procedure: POLYPECTOMY;  Surgeon: Cindie Carlin POUR, DO;  Location: [facility];  Service: Endoscopy;;   RECTAL EXAM UNDER ANESTHESIA N/A 07/23/2021   Procedure:  RECTAL EXAM UNDER ANESTHESIA;  Surgeon: Debby Hila, MD;  Location: [facility];  Service: General;  Laterality: N/A;   SPHINCTEROTOMY N/A 07/23/2021   Procedure: CHEMICAL SPHINCTEROTOMY;  Surgeon: Debby Hila, MD;  Location: [facility];  Service: General;  Laterality: N/A;   TONSILLECTOMY     TUBAL LIGATION      Family History  Problem Relation Age of Onset   Colon cancer Mother        43s   Urticaria Mother    Heart disease Mother    Colon polyps Mother    Cancer Father        oral   Crohn's disease Sister    Cancer Sister        around heart   Colon polyps Sister    Diabetes Brother    Other Brother        intestinal sugery   Colon polyps Brother    Colon cancer Maternal Grandmother    Colon cancer Maternal Grandfather    Colon cancer Paternal Grandfather    Anesthesia problems Neg Hx     Social History   Socioeconomic History   Marital status: Widowed    Spouse name: Not on file   Number of children: 1   Years of education: Not on file   Highest education level: Not on file  Occupational History   Occupation: homemaker    Employer: UNEMPLOYED  Tobacco Use   Smoking status: Former    Current packs/day: 0.00    Average packs/day: 0.5 packs/day for 30.0  years (15.0 ttl pk-yrs)    Types: Cigarettes    Start date: 10/15/1966    Quit date: 10/15/1996    Years since quitting: 27.6   Smokeless tobacco: Never   Tobacco comments:    Stopped smoking ~ 2000  Vaping Use   Vaping status: Never Used  Substance and Sexual Activity   Alcohol use: Not Currently    Alcohol/week: 4.0 standard drinks of alcohol    Types: 4 Glasses of wine per week    Comment: 1 drinks every couple of weeks   Drug use: No   Sexual activity: Not Currently    Birth control/protection: Surgical  Other Topics Concern   Not on file  Social History Narrative   Does not routinely exercise. Husband passed in JUN 2015 due to prostate ca.   Social Drivers of Corporate investment banker Strain: Low Risk  (06/11/2023)   Overall Financial Resource Strain (CARDIA)    Difficulty of Paying Living Expenses: Not hard at all  Food Insecurity: No Food Insecurity (02/06/2024)   Hunger Vital Sign    Worried About Running Out of Food in the Last Year: Never true    Ran Out of Food in the Last Year: Never true  Transportation Needs: Unmet Transportation Needs (04/26/2024)   PRAPARE - Transportation    Lack of Transportation (Medical): Yes    Lack of Transportation (Non-Medical): Yes  Physical Activity: Sufficiently Active (06/11/2023)   Exercise Vital Sign    Days of Exercise per Week: 6 days    Minutes of Exercise per Session: 30 min  Stress: No Stress Concern Present (06/11/2023)   [employer] of Occupational Health - Occupational Stress Questionnaire    Feeling of Stress : Not at all  Social Connections: Socially Isolated (02/06/2024)   Social Connection and Isolation Panel    Frequency of Communication with Friends and Family: Three times a week    Frequency of  Social Gatherings with Friends and Family: Once a week    Attends Religious Services: Never    Database administrator or Organizations: No    Attends Banker Meetings: Never    Marital Status: Widowed   Intimate Partner Violence: Not At Risk (02/06/2024)   Humiliation, Afraid, Rape, and Kick questionnaire    Fear of Current or Ex-Partner: No    Emotionally Abused: No    Physically Abused: No    Sexually Abused: No    Outpatient Medications Prior to Visit  Medication Sig Dispense Refill   albuterol  (PROVENTIL ) (2.5 MG/3ML) 0.083% nebulizer solution Take 2.5 mg by nebulization every 6 (six) hours as needed for wheezing or shortness of breath.     albuterol  (VENTOLIN  HFA) 108 (90 Base) MCG/ACT inhaler Inhale into the lungs every 6 (six) hours as needed for wheezing or shortness of breath.     ALPRAZolam  (XANAX ) 1 MG tablet Take 1 mg by mouth at bedtime as needed for anxiety. Takes 1 a day if needed     amoxicillin  (AMOXIL ) 500 MG capsule Take 4 capsules at once by mouth 1 hour prior to the dental procedure. 4 capsule 1   apixaban  (ELIQUIS ) 5 MG TABS tablet Take 1 tablet (5 mg total) by mouth 2 (two) times daily. 180 tablet 3   brimonidine  (ALPHAGAN ) 0.2 % ophthalmic solution Place 1 drop into both eyes 2 (two) times daily. 15 mL 3   chlorpheniramine-HYDROcodone  (TUSSIONEX) 10-8 MG/5ML Take 5 mLs by mouth.     Cholecalciferol  (VITAMIN D3) 50 MCG (2000 UT) TABS Take 2,000 Units by mouth daily.     dexlansoprazole  (DEXILANT ) 60 MG capsule Take 1 capsule (60 mg total) by mouth daily. 90 capsule 3   diltiazem 2 % GEL Apply 1 Application topically 2 (two) times daily.     estradiol  (ESTRACE ) 0.1 MG/GM vaginal cream Place 1 Applicatorful vaginally at bedtime.     ezetimibe  (ZETIA ) 10 MG tablet Take 1 tablet (10 mg total) by mouth daily. 90 tablet 3   irbesartan  (AVAPRO ) 150 MG tablet Take 75 mg by mouth daily. Takes 75mg  in morning     latanoprost  (XALATAN ) 0.005 % ophthalmic solution Place 1 drop into both eyes at bedtime. 9 mL 3   lidocaine  (XYLOCAINE ) 5 % ointment Apply 1 Application topically as needed. 35.44 g 0   Magnesium  500 MG TABS Take 1 tablet by mouth in the morning and at bedtime. Taking  1 in he morning, afternoon, and at bedtime     meclizine  (ANTIVERT ) 12.5 MG tablet Take 12.5 mg by mouth 3 (three) times daily as needed for dizziness.     metroNIDAZOLE  (METROCREAM ) 0.75 % cream Apply topically 2 (two) times daily.     mirtazapine  (REMERON ) 7.5 MG tablet Take 1 tablet (7.5 mg total) by mouth at bedtime. 90 tablet 0   nitroGLYCERIN  (NITROLINGUAL ) 0.4 MG/SPRAY spray Place 1 spray under the tongue every 5 (five) minutes as needed for chest pain. 12 g 3   Nitroglycerin  0.4 % OINT Place rectally.     ondansetron  (ZOFRAN ) 4 MG tablet TAKE ONE TABLET BY MOUTH EVERY 8 HOURS AS NEEDED FOR NAUSEA OR VOMITING 30 tablet 1   Probiotic Product (PROBIOTIC DAILY PO) Take 1 capsule by mouth daily. VSL #3     ranolazine  (RANEXA ) 500 MG 12 hr tablet Take 1 tablet (500 mg total) by mouth 2 (two) times daily. 180 tablet 3   rizatriptan  (MAXALT -MLT) 10 MG disintegrating tablet DISSOLVE ONE TABLET  ON TONGUE AS NEEDED FOR MIGRAINE. MAY REPEAT IN 2 HOURS IF NEEDED 9 tablet 6   XIIDRA  5 % SOLN Apply 1 drop to eye at bedtime.     fenofibrate  (TRICOR ) 145 MG tablet Take 1 tablet (145 mg total) by mouth daily. 90 tablet 3   HYDROcodone -acetaminophen  (NORCO/VICODIN) 5-325 MG tablet One tablet by mouth every six hours as needed for pain.  Seven day limit 28 tablet 0   zolpidem  (AMBIEN ) 10 MG tablet Take 1 tablet (10 mg total) by mouth at bedtime as needed for sleep. 90 tablet 1   nitrofurantoin , macrocrystal-monohydrate, (MACROBID ) 100 MG capsule Take 1 capsule (100 mg total) by mouth 2 (two) times daily. 10 capsule 0   No facility-administered medications prior to visit.    Allergies  Allergen Reactions   Nutmeg Oil (Myristica Oil) Anaphylaxis    Nut oil- skin irritation   Adhesive [Tape] Other (See Comments)    Blisters skin   Aspirin Other (See Comments)    Sickle cell trait--  Not recommended unless emergency   Imitrex  [Sumatriptan ] Hives   Levaquin  [Levofloxacin  In D5w] Other (See Comments)     Altered mental status Affects G6PD   Statins Other (See Comments)    Myopathy - elevated CK   Nsaids Other (See Comments)    Pt on blood thinner.    Keflex  [Cephalexin ] Other (See Comments)    G6PD   Latex Rash   Sulfa Antibiotics Other (See Comments)    Patient has sickle cell trait    ROS Review of Systems  Constitutional:  Negative for chills and fever.  HENT:  Negative for congestion and sore throat.   Eyes:  Positive for photophobia, pain and visual disturbance. Negative for discharge.  Respiratory:  Positive for cough (Intermittent). Negative for shortness of breath and wheezing.   Cardiovascular:  Negative for chest pain and palpitations.  Gastrointestinal:  Positive for constipation. Negative for abdominal pain, blood in stool, diarrhea, nausea and vomiting.  Endocrine: Negative for polydipsia and polyuria.  Genitourinary:  Negative for dysuria and hematuria.  Musculoskeletal:  Positive for arthralgias, back pain, neck pain and neck stiffness.  Skin:  Negative for rash.  Neurological:  Positive for headaches. Negative for weakness.  Psychiatric/Behavioral:  Negative for agitation and behavioral problems.       Objective:    Physical Exam Vitals reviewed.  Constitutional:      General: She is not in acute distress.    Appearance: She is not diaphoretic.  HENT:     Head: Normocephalic and atraumatic.     Nose: No congestion.     Mouth/Throat:     Mouth: Mucous membranes are moist.  Eyes:     General: No scleral icterus.    Extraocular Movements: Extraocular movements intact.  Cardiovascular:     Rate and Rhythm: Normal rate and regular rhythm.     Heart sounds: Normal heart sounds. No murmur heard. Pulmonary:     Breath sounds: Normal breath sounds. No wheezing or rales.  Musculoskeletal:        General: Tenderness (Lower lumbar spine area) present.     Left hand: Decreased strength of thumb/finger opposition.     Cervical back: Neck supple. No tenderness.      Right lower leg: No edema.     Left lower leg: No edema.  Skin:    General: Skin is warm.     Findings: No rash.  Neurological:     General: No focal deficit present.  Mental Status: She is alert and oriented to person, place, and time.     Sensory: No sensory deficit.     Motor: No weakness.  Psychiatric:        Mood and Affect: Mood normal.        Behavior: Behavior normal.     BP 137/75   Pulse 96   Ht 5' 7 (1.702 m)   Wt 202 lb 9.6 oz (91.9 kg)   SpO2 98%   BMI 31.73 kg/m  Wt Readings from Last 3 Encounters:  06/07/24 202 lb 9.6 oz (91.9 kg)  05/24/24 201 lb (91.2 kg)  05/18/24 208 lb (94.3 kg)    Lab Results  Component Value Date   TSH 2.572 05/02/2024   Lab Results  Component Value Date   WBC 5.0 02/06/2024   HGB 11.9 (L) 02/06/2024   HCT 35.0 (L) 02/06/2024   MCV 98.3 02/06/2024   PLT 198 02/06/2024   Lab Results  Component Value Date   NA 141 02/06/2024   K 3.5 02/06/2024   CO2 25 02/06/2024   GLUCOSE 127 (H) 02/06/2024   BUN 13 02/06/2024   CREATININE 1.07 (H) 02/06/2024   BILITOT 0.7 02/06/2024   ALKPHOS 44 02/06/2024   AST 20 02/06/2024   ALT 15 02/06/2024   PROT 7.0 02/06/2024   ALBUMIN 3.9 02/06/2024   CALCIUM 9.7 05/10/2024   ANIONGAP 9 02/06/2024   EGFR 54 (L) 11/17/2023   Lab Results  Component Value Date   CHOL 242 (H) 05/02/2024   Lab Results  Component Value Date   HDL 35 (L) 05/02/2024   Lab Results  Component Value Date   LDLCALC 168 (H) 05/02/2024   Lab Results  Component Value Date   TRIG 194 (H) 05/02/2024   Lab Results  Component Value Date   CHOLHDL 6.9 05/02/2024   Lab Results  Component Value Date   HGBA1C 5.1 (A) 11/10/2023      Assessment & Plan:   Problem List Items Addressed This Visit       Cardiovascular and Mediastinum   Essential hypertension, benign - Primary    BP Readings from Last 1 Encounters:  06/07/24 137/75   Well-controlled with Irbesartan  75 mg QD Counseled for  compliance with the medications Advised DASH diet and moderate exercise/walking as tolerated      Relevant Medications   fenofibrate  (TRICOR ) 145 MG tablet     Digestive   Irritable bowel syndrome   Has chronic constipation Advised to use Dulcolax or Senokot for constipation Can use MiraLAX  for persistent constipation Advised to maintain adequate hydration      GERD (gastroesophageal reflux disease)   Overall well-controlled currently Has stopped taking Dexilant  since C. Diff. Colitis Avoid hot and spicy food        Endocrine   Hypothyroidism   Lab Results  Component Value Date   TSH 2.572 05/02/2024   Was on Levothyroxine  25 mcg once daily, recently discontinued due to elevated free T4 Follows up with Dr Lenis Checked TSH and free T4        Musculoskeletal and Integument   DDD (degenerative disc disease), lumbosacral   Avoid heavy lifting and frequent bending Advised to use back brace and/or heating pad as needed Has been evaluated by orthopedic surgeon - Norco as needed for severe pain Unable to take oral NSAIDs Medrol  Dosepak for acute low back pain      Relevant Medications   methylPREDNISolone  (MEDROL  DOSEPAK) 4 MG TBPK tablet  HYDROcodone -acetaminophen  (NORCO/VICODIN) 5-325 MG tablet   Generalized OA   Takes Tylenol  PRN Has tried lidocaine  ointment recently with adequate relief, new prescription sent Norco only for severe pain, refilled for now Follow up with orthopedic surgeon for knee pain      Relevant Medications   methylPREDNISolone  (MEDROL  DOSEPAK) 4 MG TBPK tablet   HYDROcodone -acetaminophen  (NORCO/VICODIN) 5-325 MG tablet     Genitourinary   Stage 3a chronic kidney disease (HCC)   Last BMP reviewed, GFR stays around 50-55 Advised to maintain adequate hydration Avoid nephrotoxic agents, including NSAIDs Check CBC and BMP      Relevant Orders   CBC with Differential/Platelet   Basic Metabolic Panel (BMET)     Other   Chronic insomnia  (Chronic)   Usually well-controlled, recently worse due to multiple office visits and increasing stress due to medical conditions Takes Ambien  10 mg qHS PRN Has Xanax  1 mg as needed for anxiety/panic episodes, she did not take Ambien  and Xanax  together Advised to hold Xanax  for now Had started Remeron  7.5 mg nightly for insomnia and to improve appetite, but has not started it yet, agrees to start it      Relevant Medications   zolpidem  (AMBIEN ) 10 MG tablet   Hyperlipidemia   On fenofibrate  and Zetia  Does not tolerate statin Check lipid profile      Relevant Medications   fenofibrate  (TRICOR ) 145 MG tablet   Other Visit Diagnoses       Hyperglycemia       Relevant Orders   Hemoglobin A1c     Encounter for immunization       Relevant Orders   Flu vaccine HIGH DOSE PF(Fluzone Trivalent) (Completed)         Meds ordered this encounter  Medications   zolpidem  (AMBIEN ) 10 MG tablet    Sig: Take 1 tablet (10 mg total) by mouth at bedtime as needed for sleep.    Dispense:  90 tablet    Refill:  1    FAX TO:[facility] - [phone]   fenofibrate  (TRICOR ) 145 MG tablet    Sig: Take 1 tablet (145 mg total) by mouth daily.    Dispense:  90 tablet    Refill:  3    FAX TO: [facility] [phone]   methylPREDNISolone  (MEDROL  DOSEPAK) 4 MG TBPK tablet    Sig: Take as package instructions.    Dispense:  1 each    Refill:  0   HYDROcodone -acetaminophen  (NORCO/VICODIN) 5-325 MG tablet    Sig: Take 1 tablet by mouth every 6 (six) hours as needed for moderate pain (pain score 4-6). One tablet by mouth every six hours as needed for pain.  Seven day limit    Dispense:  28 tablet    Refill:  0    Follow-up: Return in about 4 months (around 10/07/2024) for HTN and insomnia.    Suzzane MARLA Blanch, MD

## 2024-06-07 NOTE — Assessment & Plan Note (Signed)
 Has chronic constipation Advised to use Dulcolax or Senokot for constipation Can use MiraLAX  for persistent constipation Advised to maintain adequate hydration

## 2024-06-07 NOTE — Assessment & Plan Note (Signed)
 Last BMP reviewed, GFR stays around 50-55 Advised to maintain adequate hydration Avoid nephrotoxic agents, including NSAIDs Check CBC and BMP

## 2024-06-07 NOTE — Assessment & Plan Note (Signed)
 Takes Tylenol  PRN Has tried lidocaine  ointment recently with adequate relief, new prescription sent Norco only for severe pain, refilled for now Follow up with orthopedic surgeon for knee pain

## 2024-06-07 NOTE — Assessment & Plan Note (Addendum)
  BP Readings from Last 1 Encounters:  06/07/24 137/75   Well-controlled with Irbesartan  75 mg QD Counseled for compliance with the medications Advised DASH diet and moderate exercise/walking as tolerated

## 2024-06-14 ENCOUNTER — Ambulatory Visit

## 2024-06-14 VITALS — BP 137/75 | Ht 67.0 in | Wt 202.0 lb

## 2024-06-14 DIAGNOSIS — Z0000 Encounter for general adult medical examination without abnormal findings: Secondary | ICD-10-CM | POA: Diagnosis not present

## 2024-06-14 DIAGNOSIS — Z1231 Encounter for screening mammogram for malignant neoplasm of breast: Secondary | ICD-10-CM

## 2024-06-14 NOTE — Patient Instructions (Addendum)
 [name],  Thank you for taking the time for your Medicare Wellness Visit. I appreciate your continued commitment to your health goals. Please review the care plan we discussed, and feel free to reach out if I can assist you further.    Ongoing Care Seeing your primary care provider every 3 to 6 months helps us  monitor your health and provide consistent, personalized care.     Referrals If a referral was made during today's visit and you haven't received any updates within two weeks, please contact the referred provider directly to check on the status.  To Schedule Your Mammogram, Call:  [facility] @  [phone]   Wishing you good health and many blessings for the year to come  -Khrista Braun     Medicare recommends these wellness visits once per year to help you and your care team stay ahead of potential health issues. These visits are designed to focus on prevention, allowing your provider to concentrate on managing your acute and chronic conditions during your regular appointments.     Please note that Annual Wellness Visits do not include a physical exam. Some assessments may be limited, especially if the visit was conducted virtually. If needed, we may recommend a separate in-person follow-up with your provider.  Recommended Screenings:  Health Maintenance  Topic Date Due   COVID-19 Vaccine (8 - Moderna risk 2024-25 season) 06/05/2024   Mammogram  07/07/2024   DTaP/Tdap/Td vaccine (3 - Td or Tdap) 08/03/2024   Medicare Annual Wellness Visit  06/14/2025   DEXA scan (bone density measurement)  07/06/2025   Colon Cancer Screening  04/21/2026   Pneumococcal Vaccine for age over 30  Completed   Flu Shot  Completed   Hepatitis C Screening  Completed   Zoster (Shingles) Vaccine  Completed   HPV Vaccine  Aged Out   Meningitis B Vaccine  Aged Out       06/14/2024    1:14 PM  Advanced Directives  Does Patient Have a Medical Advance Directive? No  Would patient like  information on creating a medical advance directive? Yes (MAU/Ambulatory/Procedural Areas - Information given)   Advance Care Planning is important because it: Ensures you receive medical care that aligns with your values, goals, and preferences. Provides guidance to your family and loved ones, reducing the emotional burden of decision-making during critical moments.    Vision: Annual vision screenings are recommended for early detection of glaucoma, cataracts, and diabetic retinopathy. These exams can also reveal signs of chronic conditions such as diabetes and high blood pressure.    Dental: Annual dental screenings help detect early signs of oral cancer, gum disease, and other conditions linked to overall health, including heart disease and diabetes.  Please see the attached documents for additional preventive care recommendations.

## 2024-06-14 NOTE — Progress Notes (Signed)
 Subjective:   [name] is a 73 y.o. who presents for a Medicare Wellness preventive visit.  As a reminder, Annual Wellness Visits don't include a physical exam, and some assessments may be limited, especially if this visit is performed virtually. We may recommend an in-person follow-up visit with your provider if needed.  Visit Complete: Virtual I connected with  [name] on 06/14/24 by a audio enabled telemedicine application and verified that I am speaking with the correct person using two identifiers.  Patient Location: Home  Provider Location: Home Office  I discussed the limitations of evaluation and management by telemedicine. The patient expressed understanding and agreed to proceed.  Vital Signs: Because this visit was a virtual/telehealth visit, some criteria may be missing or patient reported. Any vitals not documented were not able to be obtained and vitals that have been documented are patient reported.  VideoError- Librarian, academic were attempted between this provider and patient, however failed, due to patient having technical difficulties OR patient did not have access to video capability.  We continued and completed visit with audio only.   Persons Participating in Visit: Patient.  AWV Questionnaire: No: Patient Medicare AWV questionnaire was not completed prior to this visit.  Cardiac Risk Factors include: advanced age (>57men, >22 women);dyslipidemia;hypertension;obesity (BMI >30kg/m2);Other (see comment), Risk factor comments: recurrent pulmonary embolisms     Objective:    Today's Vitals   06/14/24 1326 06/14/24 1345  BP: 137/75   Weight: 202 lb (91.6 kg)   Height: 5' 7 (1.702 m)   PainSc:  0-No pain   Body mass index is 31.64 kg/m.     06/14/2024    1:14 PM 02/22/2024    1:17 PM 02/06/2024   11:34 PM 02/06/2024   11:09 AM 12/18/2023    9:29 AM 11/04/2023    9:39 AM 06/11/2023    1:13 PM  Advanced Directives   Does Patient Have a Medical Advance Directive? No Yes Yes Yes No No Yes  Type of Special educational needs teacher of [place];Living will Healthcare Power of [employer] Power of [employer] Power of [place];Living will  Does patient want to make changes to medical advance directive?  No - Patient declined No - Patient declined No - Patient declined   No - Patient declined  Copy of Healthcare Power of Attorney in Chart?  No - copy requested Yes - validated most recent copy scanned in chart (See row information) Yes - validated most recent copy scanned in chart (See row information)   Yes - validated most recent copy scanned in chart (See row information)  Would patient like information on creating a medical advance directive? Yes (MAU/Ambulatory/Procedural Areas - Information given)    No - Patient declined No - Patient declined     Current Medications (verified) Outpatient Encounter Medications as of 06/14/2024  Medication Sig   albuterol  (PROVENTIL ) (2.5 MG/3ML) 0.083% nebulizer solution Take 2.5 mg by nebulization every 6 (six) hours as needed for wheezing or shortness of breath.   albuterol  (VENTOLIN  HFA) 108 (90 Base) MCG/ACT inhaler Inhale into the lungs every 6 (six) hours as needed for wheezing or shortness of breath.   ALPRAZolam  (XANAX ) 1 MG tablet Take 1 mg by mouth at bedtime as needed for anxiety. Takes 1 a day if needed   amoxicillin  (AMOXIL ) 500 MG capsule Take 4 capsules at once by mouth 1 hour prior to the dental procedure.   apixaban  (ELIQUIS ) 5 MG  TABS tablet Take 1 tablet (5 mg total) by mouth 2 (two) times daily.   brimonidine  (ALPHAGAN ) 0.2 % ophthalmic solution Place 1 drop into both eyes 2 (two) times daily.   chlorpheniramine-HYDROcodone  (TUSSIONEX) 10-8 MG/5ML Take 5 mLs by mouth.   Cholecalciferol  (VITAMIN D3) 50 MCG (2000 UT) TABS Take 2,000 Units by mouth daily.   dexlansoprazole  (DEXILANT ) 60 MG capsule Take 1 capsule (60 mg total) by mouth  daily.   diltiazem 2 % GEL Apply 1 Application topically 2 (two) times daily.   estradiol  (ESTRACE ) 0.1 MG/GM vaginal cream Place 1 Applicatorful vaginally at bedtime.   ezetimibe  (ZETIA ) 10 MG tablet Take 1 tablet (10 mg total) by mouth daily.   fenofibrate  (TRICOR ) 145 MG tablet Take 1 tablet (145 mg total) by mouth daily.   HYDROcodone -acetaminophen  (NORCO/VICODIN) 5-325 MG tablet Take 1 tablet by mouth every 6 (six) hours as needed for moderate pain (pain score 4-6). One tablet by mouth every six hours as needed for pain.  Seven day limit   irbesartan  (AVAPRO ) 150 MG tablet Take 75 mg by mouth daily. Takes 75mg  in morning   latanoprost  (XALATAN ) 0.005 % ophthalmic solution Place 1 drop into both eyes at bedtime.   lidocaine  (XYLOCAINE ) 5 % ointment Apply 1 Application topically as needed.   Magnesium  500 MG TABS Take 1 tablet by mouth in the morning and at bedtime. Taking 1 in he morning, afternoon, and at bedtime   meclizine  (ANTIVERT ) 12.5 MG tablet Take 12.5 mg by mouth 3 (three) times daily as needed for dizziness.   methylPREDNISolone  (MEDROL  DOSEPAK) 4 MG TBPK tablet Take as package instructions.   metroNIDAZOLE  (METROCREAM ) 0.75 % cream Apply topically 2 (two) times daily.   mirtazapine  (REMERON ) 7.5 MG tablet Take 1 tablet (7.5 mg total) by mouth at bedtime.   nitroGLYCERIN  (NITROLINGUAL ) 0.4 MG/SPRAY spray Place 1 spray under the tongue every 5 (five) minutes as needed for chest pain.   Nitroglycerin  0.4 % OINT Place rectally.   ondansetron  (ZOFRAN ) 4 MG tablet TAKE ONE TABLET BY MOUTH EVERY 8 HOURS AS NEEDED FOR NAUSEA OR VOMITING   Probiotic Product (PROBIOTIC DAILY PO) Take 1 capsule by mouth daily. VSL #3   ranolazine  (RANEXA ) 500 MG 12 hr tablet Take 1 tablet (500 mg total) by mouth 2 (two) times daily.   rizatriptan  (MAXALT -MLT) 10 MG disintegrating tablet DISSOLVE ONE TABLET ON TONGUE AS NEEDED FOR MIGRAINE. MAY REPEAT IN 2 HOURS IF NEEDED   XIIDRA  5 % SOLN Apply 1 drop to eye  at bedtime.   zolpidem  (AMBIEN ) 10 MG tablet Take 1 tablet (10 mg total) by mouth at bedtime as needed for sleep.   No facility-administered encounter medications on file as of 06/14/2024.    Allergies (verified) Nutmeg oil (myristica oil), Adhesive [tape], Aspirin, Imitrex  [sumatriptan ], Levaquin  [levofloxacin  in d5w], Statins, Nsaids, Keflex  [cephalexin ], Latex, and Sulfa antibiotics   History: Past Medical History:  Diagnosis Date   Anal fissure    Anxiety    Arthritis    Asthma    C. difficile diarrhea 09/17/2021   Cataract    Phreesia 01/05/2021   Chest pain    a. 02/2000 Cath: nl cors, EF 60%;  b. 10/2010 MV: nl LV, no ischemia/infarct;  c. 03/2014 Admit c/p, r/o->grief from husbands death.   Chronic RUQ pain Jul 10, 2008   EUS slightly dilated CBD (7.53mm), otherwise nl   Depression    Diverticulosis    Eczema    Exposure to chemical inhalation mid 1970s  Fatty liver    G6PD deficiency    Gallstones    GERD (gastroesophageal reflux disease)    Glaucoma    History of pulmonary embolus (PE)    Treated with Eliquis  2016   HTN (hypertension)    Hypercholesteremia    a. intolerant to statins.   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 She has limited vision in the right eye.   Migraines    inner ocular   Ocular migraine    Palpitations    Pleurisy    Pneumonia 07/16/2021   Patient was exposed to a harsh chemical in the 1970's that created scar tissue in her lungs. She has had pneumonia several times in the past.   PONV (postoperative nausea and vomiting)    Pulmonary emboli (HCC)    Recurrent upper respiratory infection (URI)    Renal cyst    Retinal cyst    Sickle cell trait (HCC)    Tubular adenoma of colon 09/17/2011   Urticaria    Past Surgical History:  Procedure Laterality Date   ABDOMINAL HYSTERECTOMY     ADENOIDECTOMY     ANGIOPLASTY     APPENDECTOMY     BIOPSY N/A  03/14/2013   Procedure: SMALL BOWEL AND GASTRIC BIOPSIES (Procedure #1);  Surgeon: Margo LITTIE Haddock, MD;  Location: [facility];  Service: Endoscopy;  Laterality: N/A;   CARDIAC CATHETERIZATION Left 02/11/2000   CATARACT EXTRACTION Left    CATARACT EXTRACTION W/PHACO Right 03/19/2021   Procedure: CATARACT EXTRACTION PHACO AND INTRAOCULAR LENS PLACEMENT (IOC) RIGHT 6.75 00:50.4;  Surgeon: Mittie Gaskin, MD;  [facility];  Service: Ophthalmology;  Laterality: Right;   CHOLECYSTECTOMY  12/2007   COLONOSCOPY  12/22/2010   MFM:[id number], cecal adenomatous polyp   COLONOSCOPY WITH PROPOFOL  N/A 03/31/2016   Dr. Haddock: four sessile polyps rectum/sigmoid colon, diverticulosi, ext/int hemorrhoids. hyperplastic polyps, next tcs 5 years.   COLONOSCOPY WITH PROPOFOL  N/A 04/21/2021   anal fissure, non-bleeding internal hemorrhoids, right colon diverticulosis, one 2 mm polyp in ascending, three 4-6 mm polyps in descending colon. Tubular adenomas. 5 year surveillance.   complete hysterectomy     ENTEROSCOPY N/A 03/14/2013   DOQ:fpoi gastritis/ulcers has healed   ESOPHAGOGASTRODUODENOSCOPY  09/22/2011   DOQ:Fpoi gastritis/Duodenitis   EXCISIONAL HEMORRHOIDECTOMY     FLEXIBLE SIGMOIDOSCOPY N/A 03/14/2013   SLF:3 colon polyp removed/moderate sized internal hemorrhoids   FLEXIBLE SIGMOIDOSCOPY N/A 06/09/2016   Procedure: FLEXIBLE SIGMOIDOSCOPY;  Surgeon: Margo LITTIE Haddock, MD;  Location: [facility];  Service: Endoscopy;  Laterality: N/A;  rectal polyps times 2   FLEXIBLE SIGMOIDOSCOPY N/A 03/18/2018   Procedure: FLEXIBLE SIGMOIDOSCOPY;  Surgeon: Haddock Margo LITTIE, MD;  Location: [facility];  Service: Endoscopy;  Laterality: N/A;  9:15am   FOOT SURGERY     bunion removal left   GIVENS CAPSULE STUDY N/A 02/10/2013   Procedure: GIVENS CAPSULE STUDY;  Surgeon: Margo LITTIE Haddock, MD;  Location: [facility];  Service: Endoscopy;  Laterality: N/A;  730   HEEL SPUR EXCISION     right    HEMORRHOID  BANDING N/A 03/14/2013   Procedure: HEMORRHOID BANDING (Procedure #3)  3 bands applied Onu#[id number] Exp 02/02/2014 ;  Surgeon: Margo LITTIE Haddock, MD;  Location: [facility];  Service: Endoscopy;  Laterality: N/A;   HEMORRHOID SURGERY N/A 04/24/2016   Procedure: EXTENSIVE HEMORRHOIDECTOMY;  Surgeon: Selinda Artist Moats, MD;  Location: [facility];  Service: General;  Laterality: N/A;   LAPAROSCOPY     adhesions   POLYPECTOMY N/A 03/14/2013   DOQ:FPOI Gastritis . ULCERS SEEN ON MAY 6 HAVE HEALED   POLYPECTOMY  03/31/2016   Procedure: POLYPECTOMY;  Surgeon: Margo LITTIE Haddock, MD;  Location: [facility];  Service: Endoscopy;;  sigmoid colon polyps x2, rectal polyps x2   POLYPECTOMY  04/21/2021   Procedure: POLYPECTOMY;  Surgeon: Cindie Carlin POUR, DO;  Location: [facility];  Service: Endoscopy;;   RECTAL EXAM UNDER ANESTHESIA N/A 07/23/2021   Procedure: RECTAL EXAM UNDER ANESTHESIA;  Surgeon: Debby Hila, MD;  Location[facility];  Service: General;  Laterality: N/A;   SPHINCTEROTOMY N/A 07/23/2021   Procedure: CHEMICAL SPHINCTEROTOMY;  Surgeon: Debby Hila, MD;  Location: [facility];  Service: General;  Laterality: N/A;   TONSILLECTOMY     TUBAL LIGATION     Family History  Problem Relation Age of Onset   Colon cancer Mother        58s   Urticaria Mother    Heart disease Mother    Colon polyps Mother    Cancer Father        oral   Crohn's disease Sister    Cancer Sister        around heart   Colon polyps Sister    Diabetes Brother    Other Brother        intestinal sugery   Colon polyps Brother    Colon cancer Maternal Grandmother    Colon cancer Maternal Grandfather    Colon cancer Paternal Grandfather    Anesthesia problems Neg Hx    Social History   Socioeconomic History   Marital status: Widowed    Spouse name: Not on file   Number of children: 1   Years of education: Not on file   Highest education level: Not on file  Occupational History    Occupation: homemaker    Employer: UNEMPLOYED  Tobacco Use   Smoking status: Former    Current packs/day: 0.00    Average packs/day: 0.5 packs/day for 30.0 years (15.0 ttl pk-yrs)    Types: Cigarettes    Start date: 10/15/1966    Quit date: 10/15/1996    Years since quitting: 27.6   Smokeless tobacco: Never   Tobacco comments:    Stopped smoking ~ 2000  Vaping Use   Vaping status: Never Used  Substance and Sexual Activity   Alcohol use: Not Currently    Alcohol/week: 4.0 standard drinks of alcohol    Types: 4 Glasses of wine per week    Comment: 1 drinks every couple of weeks   Drug use: No   Sexual activity: Not Currently    Birth control/protection: Surgical  Other Topics Concern   Not on file  Social History Narrative   Does not routinely exercise. Husband passed in JUN 2015 due to prostate ca.   Social Drivers of Corporate investment banker Strain: Low Risk  (06/14/2024)   Overall Financial Resource Strain (CARDIA)    Difficulty of Paying Living Expenses: Not hard at all  Food Insecurity: No Food Insecurity (06/14/2024)   Hunger Vital Sign    Worried About Running Out of Food in the Last Year: Never true    Ran Out of Food in the Last Year: Never true  Transportation Needs: Unmet Transportation Needs (06/14/2024)   PRAPARE - Administrator, Civil Service (Medical): Yes  Lack of Transportation (Non-Medical): Yes  Physical Activity: Sufficiently Active (06/14/2024)   Exercise Vital Sign    Days of Exercise per Week: 7 days    Minutes of Exercise per Session: 30 min  Stress: No Stress Concern Present (06/14/2024)   [employer] of Occupational Health - Occupational Stress Questionnaire    Feeling of Stress: Not at all  Social Connections: Socially Isolated (06/14/2024)   Social Connection and Isolation Panel    Frequency of Communication with Friends and Family: Three times a week    Frequency of Social Gatherings with Friends and Family: Once a week     Attends Religious Services: Never    Database administrator or Organizations: No    Attends Banker Meetings: Never    Marital Status: Widowed    Tobacco Counseling Counseling given: Yes Tobacco comments: Stopped smoking ~ 2000    Clinical Intake:  Pre-visit preparation completed: Yes  Pain : No/denies pain Pain Score: 0-No pain     BMI - recorded: 31.64 Nutritional Status: BMI > 30  Obese Nutritional Risks: None Diabetes: No  Lab Results  Component Value Date   HGBA1C 5.1 (A) 11/10/2023   HGBA1C 5.6 02/01/2023   HGBA1C 5.2 01/20/2022     How often do you need to have someone help you when you read instructions, pamphlets, or other written materials from your doctor or pharmacy?: 1 - Never  Interpreter Needed?: No  Information entered by :: Cary Wilford W CMA (AAMA)   Activities of Daily Living     06/14/2024    1:48 PM 02/06/2024   11:34 PM  In your present state of health, do you have any difficulty performing the following activities:  Hearing? 0 0  Vision? 1 0  Difficulty concentrating or making decisions? 0 0  Walking or climbing stairs? 0   Dressing or bathing? 0   Doing errands, shopping? 0 1  Preparing Food and eating ? N   Using the Toilet? N   In the past six months, have you accidently leaked urine? Y   Do you have problems with loss of bowel control? N   Managing your Medications? N   Managing your Finances? N   Housekeeping or managing your Housekeeping? N     Patient Care Team: Tobie Suzzane POUR, MD as PCP - General (Internal Medicine) Darlean Ozell NOVAK, MD as Consulting Physician (Pulmonary Disease) Mittie Gaskin, MD as Referring Physician (Ophthalmology) Lenis Ethelle ORN, MD as Consulting Physician (Endocrinology) Debera Jayson MATSU, MD as Consulting Physician (Cardiology) Stacia Diannah SQUIBB, MD as Consulting Physician (Cardiology) Gregg Lek, MD as Consulting Physician (Neurology) Federico Rosario BROCKS, MD as Consulting  Physician (Gastroenterology) Fenton Marit DEL, LCSW as Social Worker (Licensed Clinical Social Worker) Brenna Lin, MD as Consulting Physician (Orthopedic Surgery)  I have updated your Care Teams any recent Medical Services you may have received from other providers in the past year.     Assessment:   This is a routine wellness examination for Azoria.  Hearing/Vision screen Hearing Screening - Comments:: Patient denies any hearing difficulties.   Vision Screening - Comments:: Patient has difficulty seeing. 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 Sees Dr. Gaskin Mittie at [facility] and is up to date on eye exams.    Goals Addressed               This  Visit's Progress     Autogenerated Goal (pt-stated)        I want to get things done and just keep going like I'm going. Get things really organized and cleaned up and being able to get people to do the things I pay for them to do such as yard maintenance.        Depression Screen     06/14/2024    2:08 PM 06/07/2024    1:35 PM 04/11/2024    1:05 PM 02/17/2024   10:37 AM 02/02/2024    1:32 PM 01/07/2024    9:53 AM 12/23/2023    1:47 PM  PHQ 2/9 Scores  PHQ - 2 Score 0 0 0 0 0 0 0  PHQ- 9 Score 7 3 0 0 6 3 0    Fall Risk     06/14/2024    1:46 PM 06/07/2024    1:35 PM 05/01/2024    1:08 PM 04/11/2024    1:05 PM 02/17/2024   10:37 AM  Fall Risk   Falls in the past year? 0 0 1 0 0  Number falls in past yr: 0 0 1 0 0  Injury with Fall? 0 0 1 0 0  Risk for fall due to : No Fall Risks;Impaired vision;Impaired mobility No Fall Risks History of fall(s);Impaired balance/gait;Orthopedic patient No Fall Risks No Fall Risks  Risk for fall due to: Comment pt hasn't had a fall, however, she is a high fall risk due to limited vision      Follow up Falls evaluation completed;Education provided;Falls prevention discussed Falls evaluation completed Falls  evaluation completed;Education provided Falls evaluation completed Falls evaluation completed    MEDICARE RISK AT HOME:  Medicare Risk at Home Any stairs in or around the home?: Yes If so, are there any without handrails?: No Home free of loose throw rugs in walkways, pet beds, electrical cords, etc?: Yes Adequate lighting in your home to reduce risk of falls?: Yes Life alert?: Yes Use of a cane, walker or w/c?: No Grab bars in the bathroom?: Yes Shower chair or bench in shower?: Yes Elevated toilet seat or a handicapped toilet?: Yes  TIMED UP AND GO:  Was the test performed?  No  Cognitive Function: 6CIT completed    05/18/2022    3:33 PM  MMSE - Mini Mental State Exam  Not completed: Unable to complete        06/14/2024    1:54 PM 06/11/2023    1:15 PM 05/18/2022    3:34 PM 04/02/2021   10:50 AM  6CIT Screen  What Year? 0 points 0 points 0 points 0 points  What month? 0 points 0 points 0 points 0 points  What time? 0 points 0 points 0 points 0 points  Count back from 20 0 points 0 points 0 points 0 points  Months in reverse 0 points 0 points 0 points 0 points  Repeat phrase 0 points 0 points 0 points 0 points  Total Score 0 points 0 points 0 points 0 points    Immunizations Immunization History  Administered Date(s) Administered    sv, Bivalent, Protein Subunit Rsvpref,pf [place]) 06/30/2022   Fluad Quad(high Dose 65+) 06/05/2019, 07/22/2020, 06/16/2021, 06/30/2022   Fluad Trivalent(High Dose 65+) 06/03/2023   INFLUENZA, HIGH DOSE SEASONAL PF 06/07/2024   Influenza Split 07/12/2012   Influenza,inj,Quad PF,6+ Mos 08/03/2014, 07/19/2015, 08/11/2016, 06/24/2017, 07/04/2018   Moderna Covid-19 Vaccine  Bivalent Booster 27yrs & up 01/31/2021, 06/30/2021, 01/30/2022   Moderna SARS-COV2  Booster Vaccination 06/30/2022   Moderna Sars-Covid-2 Vaccination 11/16/2019, 12/15/2019, 08/02/2020   Pneumococcal Conjugate-13 02/15/2018   Pneumococcal Polysaccharide-23 08/03/2014,  01/12/2020   Tdap 08/25/2012, 08/03/2014   Unspecified SARS-COV-2 Vaccination 07/07/2023   Zoster Recombinant(Shingrix ) 01/16/2020, 03/21/2020   Zoster, Live 08/25/2012    Screening Tests Health Maintenance  Topic Date Due   COVID-19 Vaccine (8 - Moderna risk 2024-25 season) 06/05/2024   MAMMOGRAM  07/07/2024   DTaP/Tdap/Td (3 - Td or Tdap) 08/03/2024   Medicare Annual Wellness (AWV)  06/14/2025   DEXA SCAN  07/06/2025   Colonoscopy  04/21/2026   Pneumococcal Vaccine: 50+ Years  Completed   Influenza Vaccine  Completed   Hepatitis C Screening  Completed   Zoster Vaccines- Shingrix   Completed   HPV VACCINES  Aged Out   Meningococcal B Vaccine  Aged Out    Health Maintenance  Health Maintenance Due  Topic Date Due   COVID-19 Vaccine (8 - Moderna risk 2024-25 season) 06/05/2024   Health Maintenance Items Addressed: Mammogram ordered  Additional Screening:  Vision Screening: Recommended annual ophthalmology exams for early detection of glaucoma and other disorders of the eye. Would you like a referral to an eye doctor? No    Dental Screening: Recommended annual dental exams for proper oral hygiene  Community Resource Referral / Chronic Care Management: CRR required this visit?  No   CCM required this visit?  No   Plan:    I have personally reviewed and noted the following in the patient's chart:   Medical and social history Use of alcohol, tobacco or illicit drugs  Current medications and supplements including opioid prescriptions. Patient is currently taking opioid prescriptions. Information provided to patient regarding non-opioid alternatives. Patient advised to discuss non-opioid treatment plan with their provider. Functional ability and status Nutritional status Physical activity Advanced directives List of other physicians Hospitalizations, surgeries, and ER visits in previous 12 months Vitals Screenings to include cognitive, depression, and  falls Referrals and appointments  In addition, I have reviewed and discussed with patient certain preventive protocols, quality metrics, and best practice recommendations. A written personalized care plan for preventive services as well as general preventive health recommendations were provided to patient.   Jelan Batterton, CMA   06/14/2024   After Visit Summary: (MyChart) Due to this being a telephonic visit, the after visit summary with patients personalized plan was offered to patient via MyChart   Notes: Nothing significant to report at this time.

## 2024-06-15 ENCOUNTER — Telehealth: Payer: Self-pay

## 2024-06-15 DIAGNOSIS — R234 Changes in skin texture: Secondary | ICD-10-CM

## 2024-06-19 ENCOUNTER — Encounter: Payer: Self-pay | Admitting: Internal Medicine

## 2024-06-19 NOTE — Telephone Encounter (Signed)
 Patient is requesting a referral for dermatology. Per provider will be discussed at her next visit

## 2024-06-29 ENCOUNTER — Other Ambulatory Visit: Payer: Self-pay | Admitting: Internal Medicine

## 2024-07-13 ENCOUNTER — Other Ambulatory Visit (INDEPENDENT_AMBULATORY_CARE_PROVIDER_SITE_OTHER): Payer: Self-pay

## 2024-07-13 ENCOUNTER — Encounter: Payer: Self-pay | Admitting: Orthopedic Surgery

## 2024-07-13 ENCOUNTER — Ambulatory Visit: Admitting: Orthopedic Surgery

## 2024-07-13 VITALS — Ht 67.0 in | Wt 202.0 lb

## 2024-07-13 DIAGNOSIS — G8929 Other chronic pain: Secondary | ICD-10-CM

## 2024-07-13 DIAGNOSIS — M7052 Other bursitis of knee, left knee: Secondary | ICD-10-CM | POA: Diagnosis not present

## 2024-07-13 DIAGNOSIS — M79674 Pain in right toe(s): Secondary | ICD-10-CM

## 2024-07-13 DIAGNOSIS — M1712 Unilateral primary osteoarthritis, left knee: Secondary | ICD-10-CM

## 2024-07-13 NOTE — Progress Notes (Signed)
    07/13/2024   Chief Complaint  Patient presents with   Knee Pain    Left / limping today increased pain since going up down attic stairs /also injury 5 months ago standing up after sitting on a railroad tie in her yard    Toe Injury    Hit right 2nd toe     Encounter Diagnosis  Name Primary?   Chronic pain of left knee Yes    What pharmacy do you use ? _____[facility] ______________________  DOI/DOS/ Date: ongoing/ toe injury was Sat Oct 4  Worse left knee

## 2024-07-13 NOTE — Progress Notes (Signed)
   Assuming care from Dr. Brenna who has retired   07/13/2024   Chief Complaint  Patient presents with   Knee Pain    Left / limping today increased pain since going up down attic stairs /also injury 5 months ago standing up after sitting on a railroad tie in her yard    Toe Injury    Hit right 2nd toe     Encounter Diagnoses  Name Primary?   Chronic pain of left knee    Pes anserinus bursitis of left knee Yes   Primary osteoarthritis of left knee     What pharmacy do you use ? _____[facility] ______________________  DOI/DOS/ Date: ongoing/ toe injury was Sat Oct 4   The patient complains of  Injury to her second toe right foot and medial knee pain and swelling.  Patient was sitting on a railroad tie got up and since that time has had pain in her knee.  Left knee.  She had an aspiration for the effusion  She had an injection for the pes tendinitis  Both the medial proximal tibia is hurting there is swelling over the pes tendons with tenderness and there is a joint effusion.  However not much actual knee joint tenderness or pain  Imaging studies were obtained.  She has narrowing of the medial compartment with some subchondral sclerosis and joint effusion.  Knee arthritis would best be graded as a 2/3  She is able to get relief with a soft wrap such as an Ace wrap.  We put an Ace wrap on and told her to tape it to keep it from sliding.  She says she thinks this will be enough to control her symptoms and was released from care  Encounter Diagnoses  Name Primary?   Chronic pain of left knee    Pes anserinus bursitis of left knee Yes   Primary osteoarthritis of left knee

## 2024-07-31 ENCOUNTER — Other Ambulatory Visit: Payer: Self-pay | Admitting: Internal Medicine

## 2024-07-31 DIAGNOSIS — R110 Nausea: Secondary | ICD-10-CM

## 2024-08-01 ENCOUNTER — Telehealth: Payer: Self-pay | Admitting: Cardiology

## 2024-08-01 MED ORDER — NEXLETOL 180 MG PO TABS: 180.0000 mg | ORAL_TABLET | Freq: Every day | ORAL | 3 refills | Status: DC

## 2024-08-01 NOTE — Telephone Encounter (Signed)
 Nexletol 180 mg daily #90 with RF:3 to [facility]

## 2024-08-01 NOTE — Telephone Encounter (Signed)
 Pt c/o medication issue:  1. Name of Medication:   ezetimibe  (ZETIA ) 10 MG tablet (Expired)    2. How are you currently taking this medication (dosage and times per day)? None   3. Are you having a reaction (difficulty breathing--STAT)? No   4. What is your medication issue? Pt has discontinued medication. It made her very sick. Pt just wanted to make office aware.

## 2024-08-01 NOTE — Telephone Encounter (Signed)
 Patient stopped zetia  six weeks ago because she had headaches and felt bad. Symptoms are resolved after stopping zetia .

## 2024-08-01 NOTE — Telephone Encounter (Signed)
 Patient is agreeable to try nexletol

## 2024-08-07 ENCOUNTER — Encounter: Payer: Self-pay | Admitting: Radiology

## 2024-08-09 ENCOUNTER — Telehealth: Payer: Self-pay | Admitting: Cardiology

## 2024-08-09 NOTE — Telephone Encounter (Signed)
 Ms. Fraiser has an upcoming appointment on 11/08/2024 with Dr. Debera in the [place] office . She has to depend on transportation. She states that we make arrangements with [employer]. 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 My manager told me to reach out to you.    Message was sent to [facility]. She is going to do research and will get back with our office.

## 2024-08-15 ENCOUNTER — Telehealth: Payer: Self-pay

## 2024-08-15 ENCOUNTER — Other Ambulatory Visit: Payer: Self-pay | Admitting: Internal Medicine

## 2024-08-15 DIAGNOSIS — M51372 Other intervertebral disc degeneration, lumbosacral region with discogenic back pain and lower extremity pain: Secondary | ICD-10-CM

## 2024-08-15 MED ORDER — HYDROCODONE-ACETAMINOPHEN 5-325 MG PO TABS
1.0000 | ORAL_TABLET | Freq: Four times a day (QID) | ORAL | 0 refills | Status: AC | PRN
Start: 2024-08-15 — End: ?

## 2024-08-15 NOTE — Telephone Encounter (Signed)
 Patient advised.

## 2024-08-15 NOTE — Telephone Encounter (Signed)
 Copied from CRM #[id number]. Topic: General - Other >> Aug 15, 2024 11:03 AM Wess RAMAN wrote: Reason for CRM: Patient would like to speak with the office about her knee, back, and prescriptions  Callback #: [id number]

## 2024-08-17 ENCOUNTER — Inpatient Hospital Stay: Attending: Oncology

## 2024-08-17 DIAGNOSIS — D6862 Lupus anticoagulant syndrome: Secondary | ICD-10-CM | POA: Diagnosis not present

## 2024-08-17 DIAGNOSIS — I10 Essential (primary) hypertension: Secondary | ICD-10-CM | POA: Insufficient documentation

## 2024-08-17 DIAGNOSIS — D649 Anemia, unspecified: Secondary | ICD-10-CM

## 2024-08-17 DIAGNOSIS — Z800 Family history of malignant neoplasm of digestive organs: Secondary | ICD-10-CM | POA: Diagnosis not present

## 2024-08-17 DIAGNOSIS — I824Z2 Acute embolism and thrombosis of unspecified deep veins of left distal lower extremity: Secondary | ICD-10-CM

## 2024-08-17 DIAGNOSIS — Z801 Family history of malignant neoplasm of trachea, bronchus and lung: Secondary | ICD-10-CM | POA: Diagnosis not present

## 2024-08-17 DIAGNOSIS — I2699 Other pulmonary embolism without acute cor pulmonale: Secondary | ICD-10-CM

## 2024-08-17 DIAGNOSIS — Z923 Personal history of irradiation: Secondary | ICD-10-CM | POA: Insufficient documentation

## 2024-08-17 DIAGNOSIS — Z86711 Personal history of pulmonary embolism: Secondary | ICD-10-CM | POA: Insufficient documentation

## 2024-08-17 DIAGNOSIS — R5383 Other fatigue: Secondary | ICD-10-CM | POA: Insufficient documentation

## 2024-08-17 DIAGNOSIS — D75A Glucose-6-phosphate dehydrogenase (G6PD) deficiency without anemia: Secondary | ICD-10-CM | POA: Insufficient documentation

## 2024-08-17 DIAGNOSIS — E7800 Pure hypercholesterolemia, unspecified: Secondary | ICD-10-CM | POA: Insufficient documentation

## 2024-08-17 DIAGNOSIS — D573 Sickle-cell trait: Secondary | ICD-10-CM | POA: Insufficient documentation

## 2024-08-17 DIAGNOSIS — R112 Nausea with vomiting, unspecified: Secondary | ICD-10-CM | POA: Diagnosis not present

## 2024-08-17 DIAGNOSIS — J45909 Unspecified asthma, uncomplicated: Secondary | ICD-10-CM | POA: Insufficient documentation

## 2024-08-17 DIAGNOSIS — Z87891 Personal history of nicotine dependence: Secondary | ICD-10-CM | POA: Insufficient documentation

## 2024-08-17 DIAGNOSIS — Z79899 Other long term (current) drug therapy: Secondary | ICD-10-CM | POA: Insufficient documentation

## 2024-08-17 DIAGNOSIS — Z86718 Personal history of other venous thrombosis and embolism: Secondary | ICD-10-CM | POA: Diagnosis not present

## 2024-08-17 LAB — D-DIMER, QUANTITATIVE: D-Dimer, Quant: 0.33 ug{FEU}/mL (ref 0.00–0.50)

## 2024-08-18 ENCOUNTER — Telehealth: Payer: Self-pay | Admitting: Licensed Clinical Social Worker

## 2024-08-18 LAB — DRVVT CONFIRM: dRVVT Confirm: 1 ratio (ref 0.8–1.2)

## 2024-08-18 LAB — CARDIOLIPIN ANTIBODIES, IGG, IGM, IGA
Anticardiolipin IgA: <9 U/mL (ref 0–11)
Anticardiolipin IgG: <9 GPL U/mL (ref 0–14)
Anticardiolipin IgM: <9 [MPL'U]/mL (ref 0–12)

## 2024-08-18 LAB — BETA-2-GLYCOPROTEIN I ABS, IGG/M/A
Beta-2 Glyco I IgG: <9 GPI IgG units (ref 0–20)
Beta-2-Glycoprotein I IgA: <9 GPI IgA units (ref 0–25)
Beta-2-Glycoprotein I IgM: 14 GPI IgM units (ref 0–32)

## 2024-08-18 LAB — DRVVT MIX: dRVVT Mix: 69.2 s — ABNORMAL HIGH (ref 0.0–40.4)

## 2024-08-18 LAB — LUPUS ANTICOAGULANT PANEL
DRVVT: 97.5 s — ABNORMAL HIGH (ref 0.0–47.0)
PTT Lupus Anticoagulant: 35.6 s (ref 0.0–43.5)

## 2024-08-18 NOTE — Telephone Encounter (Signed)
 H&V Care Navigation CSW Progress Note  Clinical Social Worker was contacted by scheduling team to inquire about ride options for appt in February 2026. Pt had discussed with this clinical research associate before last appt that transportation would be on a case by case basis due to cost to clinic as pt doesn't have ride benefits and cannot take [facility] bc they will not guide her to [place] due to liability so she relies on [employer] paying for [place] transportation. I have f/u with clinic leadership to see what they would like to do.  She was encouraged to call these services provided by [facility] to see who may be able to guide her from her door to her appt due to her visual impairments before previous appt as well when updated that we may not be able to pay for her rides.  [facility] [employer] [phone] [employer] [phone] [employer] [place] [employer] [phone] [employer] [phone] [employer] [phone] [employer] [phone]  Patient is participating in a Managed Medicaid Plan:  No, Medicare traditional plan and supplement   SDOH Screenings   Food Insecurity: No Food Insecurity (06/14/2024)  Housing: Low Risk  (06/14/2024)  Transportation Needs: Unmet Transportation Needs (06/14/2024)  Utilities: Not At Risk (06/14/2024)  Alcohol Screen: Low Risk  (06/14/2024)  Depression (PHQ2-9): Medium Risk (06/14/2024)  Financial Resource Strain: Low Risk  (06/14/2024)  Physical Activity: Sufficiently Active (06/14/2024)  Social Connections: Socially Isolated (06/14/2024)  Stress: No Stress Concern Present (06/14/2024)  Tobacco Use: Medium Risk (07/13/2024)  Health Literacy: Adequate Health Literacy (06/14/2024)    Marit Lark, MSW, LCSW Clinical Social Worker II [facility] Navigation  [phone]- work cell phone (preferred)

## 2024-08-24 ENCOUNTER — Other Ambulatory Visit: Payer: Self-pay | Admitting: *Deleted

## 2024-08-24 ENCOUNTER — Inpatient Hospital Stay: Admitting: Oncology

## 2024-08-24 ENCOUNTER — Ambulatory Visit: Payer: Self-pay | Admitting: Oncology

## 2024-08-24 ENCOUNTER — Inpatient Hospital Stay

## 2024-08-24 VITALS — BP 121/77 | HR 81 | Temp 98.0°F | Resp 18 | Wt 200.8 lb

## 2024-08-24 DIAGNOSIS — I2699 Other pulmonary embolism without acute cor pulmonale: Secondary | ICD-10-CM

## 2024-08-24 DIAGNOSIS — I824Z2 Acute embolism and thrombosis of unspecified deep veins of left distal lower extremity: Secondary | ICD-10-CM | POA: Diagnosis not present

## 2024-08-24 DIAGNOSIS — D649 Anemia, unspecified: Secondary | ICD-10-CM

## 2024-08-24 DIAGNOSIS — Z86711 Personal history of pulmonary embolism: Secondary | ICD-10-CM | POA: Diagnosis not present

## 2024-08-24 DIAGNOSIS — D573 Sickle-cell trait: Secondary | ICD-10-CM | POA: Diagnosis not present

## 2024-08-24 DIAGNOSIS — R5383 Other fatigue: Secondary | ICD-10-CM | POA: Diagnosis not present

## 2024-08-24 DIAGNOSIS — Z86718 Personal history of other venous thrombosis and embolism: Secondary | ICD-10-CM | POA: Diagnosis not present

## 2024-08-24 DIAGNOSIS — D6862 Lupus anticoagulant syndrome: Secondary | ICD-10-CM | POA: Diagnosis not present

## 2024-08-24 DIAGNOSIS — D75A Glucose-6-phosphate dehydrogenase (G6PD) deficiency without anemia: Secondary | ICD-10-CM | POA: Diagnosis not present

## 2024-08-24 LAB — CBC WITH DIFFERENTIAL/PLATELET
Abs Immature Granulocytes: 0.02 K/uL (ref 0.00–0.07)
Basophils Absolute: 0.1 K/uL (ref 0.0–0.1)
Basophils Relative: 1 %
Eosinophils Absolute: 0.3 K/uL (ref 0.0–0.5)
Eosinophils Relative: 4 %
HCT: 39.5 % (ref 36.0–46.0)
Hemoglobin: 13.2 g/dL (ref 12.0–15.0)
Immature Granulocytes: 0 %
Lymphocytes Relative: 19 %
Lymphs Abs: 1.6 K/uL (ref 0.7–4.0)
MCH: 33 pg (ref 26.0–34.0)
MCHC: 33.4 g/dL (ref 30.0–36.0)
MCV: 98.8 fL (ref 80.0–100.0)
Monocytes Absolute: 0.5 K/uL (ref 0.1–1.0)
Monocytes Relative: 6 %
Neutro Abs: 5.6 K/uL (ref 1.7–7.7)
Neutrophils Relative %: 70 %
Platelets: 234 K/uL (ref 150–400)
RBC: 4 MIL/uL (ref 3.87–5.11)
RDW: 12.1 % (ref 11.5–15.5)
WBC: 8.1 K/uL (ref 4.0–10.5)
nRBC: 0 % (ref 0.0–0.2)

## 2024-08-24 LAB — IRON AND TIBC
Iron: 166 ug/dL (ref 28–170)
Saturation Ratios: 33 % — ABNORMAL HIGH (ref 10.4–31.8)
TIBC: 507 ug/dL — ABNORMAL HIGH (ref 250–450)
UIBC: 341 ug/dL

## 2024-08-24 LAB — VITAMIN B12: Vitamin B-12: 310 pg/mL (ref 180–914)

## 2024-08-24 LAB — FOLATE: Folate: 4.8 ng/mL — ABNORMAL LOW (ref >5.9)

## 2024-08-24 LAB — FERRITIN: Ferritin: 323 ng/mL — ABNORMAL HIGH (ref 11–307)

## 2024-08-24 NOTE — Progress Notes (Signed)
 [facility] [address] [place] [address]    Clinic Day:  08/25/2024  Referring physician: Tobie Suzzane POUR, MD  Patient Care Team: Joanne Suzzane POUR, MD as PCP - General (Internal Medicine) Joanne Ozell NOVAK, MD as Consulting Physician (Pulmonary Disease) Joanne Gaskin, MD as Referring Physician (Ophthalmology) Joanne Ethelle ORN, MD as Consulting Physician (Endocrinology) Joanne Jayson MATSU, MD as Consulting Physician (Cardiology) Mallipeddi, Diannah SQUIBB, MD as Consulting Physician (Cardiology) Joanne Lek, MD as Consulting Physician (Neurology) Joanne Rosario BROCKS, MD as Consulting Physician (Gastroenterology) Joanne Marit DEL, LCSW as Social Worker (Licensed Clinical Social Worker) Joanne Lin, MD (Inactive) as Consulting Physician (Orthopedic Surgery)   ASSESSMENT & PLAN:   Assessment: 1.  Recurrent unprovoked pulmonary embolism: - First episode of unprovoked PE on 07/26/2015 with CT scan showing filling defect in the lower lobe branch of the left pulmonary artery.  Ultrasound Doppler of lower extremities with no DVT.  She was treated with 6 months of Eliquis  which was discontinued. - Presented to the hospital with dyspnea on exertion.  CT angiogram on 11/04/2023 showed multiple bilateral pulmonary emboli with CT evidence of right heart strain consistent with at least submassive pulmonary embolism.  An ultrasound Doppler of the lower extremities showed bilateral leg DVT. - Also has history of sickle cell trait ([place] ancestry), and G6PD deficiency.  Previous history of blood transfusion at the time of delivery. - As she had 2 episodes of unprovoked pulmonary embolism, I have recommended indefinite anticoagulation. 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  2.  Social/family history: - Sisters had miscarriages.  1 sister had pulmonary embolism.  Mother had colon cancer.  Sister had lung cancer.  Father had head and neck cancer and  had a history of exposure to radiation.  Plan:  1.  Recurrent unprovoked pulmonary embolism: - Lupus anticoagulant test which were done when she was hospitalized in the end of January 2025.  LA was positive.  Anticardiolipin and antibeta-2 glycoprotein 1 antibodies were negative. - Lupus anticoagulant may be false positive when it was done in the setting of new blood clot and anticoagulation. - Ultrasound Doppler from 12/23/2023 of the left leg: Complete resolution of prior DVT.  - Continue Eliquis  twice daily.   -Labs from 08/17/2024 show negative D-dimer, lupus anticoagulant, beta-2  glycoprotein, cardiolipin antibodies with elevated DRVVT mix.  An elevated DRVVT can indicate the presence of an inhibitor but when this is elevated without the presence of any other APLA's it is more likely due to anticoagulation therapy. Ideally 1 would have to test for lupus anticoagulant when the patient is off of anticoagulation for 4 weeks.  In her situation, as she requires indefinite anticoagulation, I am reluctant to stop anticoagulation to check for the lupus anticoagulant. -RTC 6 months for follow-up with repeat D-dimer and lupus anticoagulant.    2.  Anemia: -Previous labs showed mild anemia. -Patient has significantly symptomatic with fatigue and weakness.  She is also short of breath. -Unfortunately, a CBC was not drawn today. -Will add on nutritional labs along with a CBC and will call with results.  Orders Placed This Encounter  Procedures   Ferritin    Standing Status:   Future    Number of Occurrences:   1    Expected Date:   08/24/2024    Expiration Date:   11/22/2024   CBC with Differential/Platelet    Standing Status:   Future    Number of Occurrences:  1    Expected Date:   08/24/2024    Expiration Date:   11/22/2024   Copper , serum    Standing Status:   Future    Number of Occurrences:   1    Expected Date:   08/24/2024    Expiration Date:   11/22/2024   Vitamin B12    Standing  Status:   Future    Number of Occurrences:   1    Expected Date:   08/24/2024    Expiration Date:   11/22/2024   Methylmalonic acid, serum    Standing Status:   Future    Number of Occurrences:   1    Expected Date:   08/24/2024    Expiration Date:   11/22/2024   Iron and TIBC    Standing Status:   Future    Number of Occurrences:   1    Expected Date:   08/24/2024    Expiration Date:   11/22/2024   Folate    Standing Status:   Future    Number of Occurrences:   1    Expected Date:   08/24/2024    Expiration Date:   11/22/2024    [name]   11/21/20258:16 AM  CHIEF COMPLAINT:   Diagnosis: Recurrent thromboembolism  Prior Therapy: Eliquis  for 6 months in 2016  Current Therapy: Eliquis  indefinitely   HISTORY OF PRESENT ILLNESS:   Oncology History   No history exists.     INTERVAL HISTORY:   [name] is a 73 y.o. female presenting to clinic today for follow up of recurrent PE.   Patient has a medical history of sickle cell trait, G6PD deficiency, hypertension, hyperlipidemia, hypothyroidism, nonalcoholic steatohepatitis, spinal stenosis, DDD, insomnia, anxiety, and depression.   [name] was admitted to the hospital from 11/04/23 to 11/05/23 for recurrent unprovoked bilateral pulmonary emboli that were treated with a heparin  drip and transitioned to oral Eliquis . She has a prior history of unprovoked PE in 2016 and was on Eliquis  for 6 months after.   [name] was again admitted to the hospital from 02/06/24 to 02/07/24 for syncope and collapse, likely due to recently prescribed Xanax . MRI brain done on 02/06/24 showed: No acute intracranial abnormality or significant interval change. 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 Remote right orbital blowout fracture. Elongation of the left globe is again noted.  Since her last visit, she has not been hospitalized.  She denies any surgeries.  Reports overall doing well.  States she has extreme fatigue and just  feels overall exhausted all the time.  She is unsure if this is due to age or something else.  Appetite 75% energy levels are 50%.  Has dizziness and headaches.  Shortness of breath when she walks to and from her mailbox.  This is a new thing for her.  Reports most of the symptoms occurred after her last hospitalization in January.  Reports occasional constipation rotated with diarrhea.  Has nausea and vomiting at times.  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  She has poor depth perception which is also hindering her ability to move around.  She has fallen a few times due to this.  She denies any bright red blood per rectum, melena or hematochezia.  Tolerating Eliquis  well.   PAST MEDICAL HISTORY:   Past Medical History: Past Medical History:  Diagnosis Date   Anal fissure    Anxiety  Arthritis    Asthma    C. difficile diarrhea 09/17/2021   Cataract    Phreesia 01/05/2021   Chest pain    a. 02/2000 Cath: nl cors, EF 60%;  b. 10/2010 MV: nl LV, no ischemia/infarct;  c. 03/2014 Admit c/p, r/o->grief from husbands death.   Chronic RUQ pain 09/18/08   EUS slightly dilated CBD (7.73mm), otherwise nl   Depression    Diverticulosis    Eczema    Exposure to chemical inhalation mid 1970s   Fatty liver    G6PD deficiency    Gallstones    GERD (gastroesophageal reflux disease)    Glaucoma    History of pulmonary embolus (PE)    Treated with Eliquis  09/19/15   HTN (hypertension)    Hypercholesteremia    a. intolerant to statins.   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 She has limited vision in the right eye.   Migraines    inner ocular   Ocular migraine    Palpitations    Pleurisy    Pneumonia 07/16/2021   Patient was exposed to a harsh chemical in the 1970's that created scar tissue in her lungs. She has had pneumonia several times in the past.   PONV  (postoperative nausea and vomiting)    Pulmonary emboli (HCC)    Recurrent upper respiratory infection (URI)    Renal cyst    Retinal cyst    Sickle cell trait    Tubular adenoma of colon 09/17/2011   Urticaria     Surgical History: Past Surgical History:  Procedure Laterality Date   ABDOMINAL HYSTERECTOMY     ADENOIDECTOMY     ANGIOPLASTY     APPENDECTOMY     BIOPSY N/A 03/14/2013   Procedure: SMALL BOWEL AND GASTRIC BIOPSIES (Procedure #1);  Surgeon: Margo LITTIE Haddock, MD;  Location: [facility];  Service: Endoscopy;  Laterality: N/A;   CARDIAC CATHETERIZATION Left 02/11/2000   CATARACT EXTRACTION Left    CATARACT EXTRACTION W/PHACO Right 03/19/2021   Procedure: CATARACT EXTRACTION PHACO AND INTRAOCULAR LENS PLACEMENT (IOC) RIGHT 6.75 00:50.4;  Surgeon: Joanne Gaskin, MD;  Location: [facility];  Service: Ophthalmology;  Laterality: Right;   CHOLECYSTECTOMY  12/2007   COLONOSCOPY  12/22/2010   MFM:[id number], cecal adenomatous polyp   COLONOSCOPY WITH PROPOFOL  N/A 03/31/2016   Dr. Haddock: four sessile polyps rectum/sigmoid colon, diverticulosi, ext/int hemorrhoids. hyperplastic polyps, next tcs 5 years.   COLONOSCOPY WITH PROPOFOL  N/A 04/21/2021   anal fissure, non-bleeding internal hemorrhoids, right colon diverticulosis, one 2 mm polyp in ascending, three 4-6 mm polyps in descending colon. Tubular adenomas. 5 year surveillance.   complete hysterectomy     ENTEROSCOPY N/A 03/14/2013   DOQ:fpoi gastritis/ulcers has healed   ESOPHAGOGASTRODUODENOSCOPY  09/22/2011   DOQ:Fpoi gastritis/Duodenitis   EXCISIONAL HEMORRHOIDECTOMY     FLEXIBLE SIGMOIDOSCOPY N/A 03/14/2013   SLF:3 colon polyp removed/moderate sized internal hemorrhoids   FLEXIBLE SIGMOIDOSCOPY N/A 06/09/2016   Procedure: FLEXIBLE SIGMOIDOSCOPY;  Surgeon: Margo LITTIE Haddock, MD;  Location: [facility];  Service: Endoscopy;  Laterality: N/A;  rectal polyps times 2   FLEXIBLE SIGMOIDOSCOPY N/A 03/18/2018   Procedure:  FLEXIBLE SIGMOIDOSCOPY;  Surgeon: Haddock Margo LITTIE, MD;  Location: [facility];  Service: Endoscopy;  Laterality: N/A;  9:15am   FOOT SURGERY     bunion removal left   GIVENS CAPSULE STUDY N/A 02/10/2013  Procedure: GIVENS CAPSULE STUDY;  Surgeon: Margo LITTIE Haddock, MD;  Location: [facility];  Service: Endoscopy;  Laterality: N/A;  730   HEEL SPUR EXCISION     right    HEMORRHOID BANDING N/A 03/14/2013   Procedure: HEMORRHOID BANDING (Procedure #3)  3 bands applied Onu#[id number] Exp 02/02/2014 ;  Surgeon: Margo LITTIE Haddock, MD;  Location: [facility];  Service: Endoscopy;  Laterality: N/A;   HEMORRHOID SURGERY N/A 04/24/2016   Procedure: EXTENSIVE HEMORRHOIDECTOMY;  Surgeon: Selinda Artist Moats, MD;  Location: [facility];  Service: General;  Laterality: N/A;   LAPAROSCOPY     adhesions   POLYPECTOMY N/A 03/14/2013   DOQ:FPOI Gastritis . ULCERS SEEN ON MAY 6 HAVE HEALED   POLYPECTOMY  03/31/2016   Procedure: POLYPECTOMY;  Surgeon: Margo LITTIE Haddock, MD;  [facility]: [facility];  Service: Endoscopy;;  sigmoid colon polyps x2, rectal polyps x2   POLYPECTOMY  04/21/2021   Procedure: POLYPECTOMY;  Surgeon: Cindie Carlin POUR, DO;  Location: [facility];  Service: Endoscopy;;   RECTAL EXAM UNDER ANESTHESIA N/A 07/23/2021   Procedure: RECTAL EXAM UNDER ANESTHESIA;  Surgeon: Debby Hila, MD;  Location: [facility];  Service: General;  Laterality: N/A;   SPHINCTEROTOMY N/A 07/23/2021   Procedure: CHEMICAL SPHINCTEROTOMY;  Surgeon: Debby Hila, MD;  Location: [facility];  Service: General;  Laterality: N/A;   TONSILLECTOMY     TUBAL LIGATION      Social History: Social History   Socioeconomic History   Marital status: Widowed    Spouse name: Not on file   Number of children: 1   Years of education: Not on file   Highest education level: Not on file  Occupational History   Occupation: homemaker    Employer: UNEMPLOYED  Tobacco Use   Smoking status: Former     Current packs/day: 0.00    Average packs/day: 0.5 packs/day for 30.0 years (15.0 ttl pk-yrs)    Types: Cigarettes    Start date: 10/15/1966    Quit date: 10/15/1996    Years since quitting: 27.8   Smokeless tobacco: Never   Tobacco comments:    Stopped smoking ~ 2000  Vaping Use   Vaping status: Never Used  Substance and Sexual Activity   Alcohol use: Not Currently    Alcohol/week: 4.0 standard drinks of alcohol    Types: 4 Glasses of wine per week    Comment: 1 drinks every couple of weeks   Drug use: No   Sexual activity: Not Currently    Birth control/protection: Surgical  Other Topics Concern   Not on file  Social History Narrative   Does not routinely exercise. Husband passed in JUN 2015 due to prostate ca.   Social Drivers of Corporate Investment Banker Strain: Low Risk  (06/14/2024)   Overall Financial Resource Strain (CARDIA)    Difficulty of Paying Living Expenses: Not hard at all  Food Insecurity: No Food Insecurity (06/14/2024)   Hunger Vital Sign    Worried About Running Out of Food in the Last Year: Never true    Ran Out of Food in the Last Year: Never true  Transportation Needs: Unmet Transportation Needs (06/14/2024)   PRAPARE - Administrator, Civil Service (Medical): Yes    Lack of Transportation (Non-Medical): Yes  Physical Activity: Sufficiently Active (06/14/2024)   Exercise Vital Sign    Days of Exercise per Week: 7 days    Minutes of Exercise per Session: 30 min  Stress: No Stress Concern Present (06/14/2024)   [employer] of Occupational Health - Occupational Stress Questionnaire    Feeling of Stress: Not at all  Social Connections: Socially Isolated (06/14/2024)   Social Connection and Isolation Panel    Frequency of Communication with Friends and Family: Three times a week    Frequency of Social Gatherings with Friends and Family: Once a week    Attends Religious Services: Never    Database Administrator or Organizations: No     Attends Banker Meetings: Never    Marital Status: Widowed  Intimate Partner Violence: Not At Risk (06/14/2024)   Humiliation, Afraid, Rape, and Kick questionnaire    Fear of Current or Ex-Partner: No    Emotionally Abused: No    Physically Abused: No    Sexually Abused: No    Family History: Family History  Problem Relation Age of Onset   Colon cancer Mother        59s   Urticaria Mother    Heart disease Mother    Colon polyps Mother    Cancer Father        oral   Crohn's disease Sister    Cancer Sister        around heart   Colon polyps Sister    Diabetes Brother    Other Brother        intestinal sugery   Colon polyps Brother    Colon cancer Maternal Grandmother    Colon cancer Maternal Grandfather    Colon cancer Paternal Grandfather    Anesthesia problems Neg Hx     Current Medications:  Current Outpatient Medications:    albuterol  (PROVENTIL ) (2.5 MG/3ML) 0.083% nebulizer solution, Take 2.5 mg by nebulization every 6 (six) hours as needed for wheezing or shortness of breath., Disp: , Rfl:    albuterol  (VENTOLIN  HFA) 108 (90 Base) MCG/ACT inhaler, Inhale into the lungs every 6 (six) hours as needed for wheezing or shortness of breath., Disp: , Rfl:    ALPRAZolam  (XANAX ) 1 MG tablet, Take 1 mg by mouth at bedtime as needed for anxiety. Takes 1 a day if needed, Disp: , Rfl:    amoxicillin  (AMOXIL ) 500 MG capsule, Take 4 capsules at once by mouth 1 hour prior to the dental procedure., Disp: 4 capsule, Rfl: 1   apixaban  (ELIQUIS ) 5 MG TABS tablet, Take 1 tablet (5 mg total) by mouth 2 (two) times daily., Disp: 180 tablet, Rfl: 3   Bempedoic Acid  (NEXLETOL ) 180 MG TABS, Take 1 tablet (180 mg total) by mouth daily., Disp: 90 tablet, Rfl: 3   brimonidine  (ALPHAGAN ) 0.2 % ophthalmic solution, Place 1 drop into both eyes 2 (two) times daily., Disp: 15 mL, Rfl: 3   chlorpheniramine-HYDROcodone  (TUSSIONEX) 10-8 MG/5ML, Take 5 mLs by mouth., Disp: , Rfl:     Cholecalciferol  (VITAMIN D3) 50 MCG (2000 UT) TABS, Take 2,000 Units by mouth daily., Disp: , Rfl:    dexlansoprazole  (DEXILANT ) 60 MG capsule, Take 1 capsule (60 mg total) by mouth daily., Disp: 90 capsule, Rfl: 3   diltiazem 2 % GEL, Apply 1 Application topically 2 (two) times daily., Disp: , Rfl:    estradiol  (ESTRACE ) 0.1 MG/GM vaginal cream, Place 1 Applicatorful vaginally at bedtime., Disp: , Rfl:    fenofibrate  (TRICOR ) 145 MG tablet, Take 1 tablet (145 mg total) by mouth daily., Disp: 90 tablet, Rfl: 3   HYDROcodone -acetaminophen  (NORCO/VICODIN) 5-325 MG tablet, Take 1 tablet by mouth every 6 (six) hours as  needed for moderate pain (pain score 4-6)., Disp: 20 tablet, Rfl: 0   irbesartan  (AVAPRO ) 150 MG tablet, TAKE ONE-HALF TABLET BY MOUTH EVERY DAY, Disp: 45 tablet, Rfl: 1   latanoprost  (XALATAN ) 0.005 % ophthalmic solution, Place 1 drop into both eyes at bedtime., Disp: 9 mL, Rfl: 3   lidocaine  (XYLOCAINE ) 5 % ointment, Apply 1 Application topically as needed., Disp: 35.44 g, Rfl: 0   Magnesium  500 MG TABS, Take 1 tablet by mouth in the morning and at bedtime. Taking 1 in he morning, afternoon, and at bedtime, Disp: , Rfl:    meclizine  (ANTIVERT ) 12.5 MG tablet, Take 12.5 mg by mouth 3 (three) times daily as needed for dizziness., Disp: , Rfl:    metroNIDAZOLE  (METROCREAM ) 0.75 % cream, Apply topically 2 (two) times daily., Disp: , Rfl:    mirtazapine  (REMERON ) 7.5 MG tablet, Take 1 tablet (7.5 mg total) by mouth at bedtime., Disp: 90 tablet, Rfl: 0   nitroGLYCERIN  (NITROLINGUAL ) 0.4 MG/SPRAY spray, Place 1 spray under the tongue every 5 (five) minutes as needed for chest pain., Disp: 12 g, Rfl: 3   Nitroglycerin  0.4 % OINT, Place rectally., Disp: , Rfl:    ondansetron  (ZOFRAN ) 4 MG tablet, TAKE ONE TABLET BY MOUTH EVERY 8 HOURS AS NEEDED FOR NAUSEA OR VOMITING, Disp: 30 tablet, Rfl: 1   Probiotic Product (PROBIOTIC DAILY PO), Take 1 capsule by mouth daily. VSL #3, Disp: , Rfl:     ranolazine  (RANEXA ) 500 MG 12 hr tablet, Take 1 tablet (500 mg total) by mouth 2 (two) times daily., Disp: 180 tablet, Rfl: 3   rizatriptan  (MAXALT -MLT) 10 MG disintegrating tablet, DISSOLVE ONE TABLET ON TONGUE AS NEEDED FOR MIGRAINE. MAY REPEAT IN 2 HOURS IF NEEDED, Disp: 9 tablet, Rfl: 6   XIIDRA  5 % SOLN, Apply 1 drop to eye at bedtime., Disp: , Rfl:    zolpidem  (AMBIEN ) 10 MG tablet, Take 1 tablet (10 mg total) by mouth at bedtime as needed for sleep., Disp: 90 tablet, Rfl: 1   cyanocobalamin (VITAMIN B12) 1000 MCG tablet, Take 1,000 mcg by mouth daily., Disp: , Rfl:    methylPREDNISolone  (MEDROL  DOSEPAK) 4 MG TBPK tablet, Take as package instructions. (Patient not taking: Reported on 08/24/2024), Disp: 1 each, Rfl: 0   Allergies: Allergies  Allergen Reactions   Nutmeg Oil (Myristica Oil) Anaphylaxis    Nut oil- skin irritation   Adhesive [Tape] Other (See Comments)    Blisters skin   Aspirin Other (See Comments)    Sickle cell trait--  Not recommended unless emergency   Imitrex  [Sumatriptan ] Hives   Levaquin  [Levofloxacin  In D5w] Other (See Comments)    Altered mental status Affects G6PD   Statins Other (See Comments)    Myopathy - elevated CK   Nsaids Other (See Comments)    Pt on blood thinner.    Zetia  [Ezetimibe ] Other (See Comments)    headache   Keflex  [Cephalexin ] Other (See Comments)    G6PD   Latex Rash   Sulfa Antibiotics Other (See Comments)    Patient has sickle cell trait    REVIEW OF SYSTEMS:   Review of Systems  Constitutional:  Positive for fatigue.  Respiratory:  Positive for shortness of breath.   Gastrointestinal:  Positive for constipation, diarrhea, nausea and vomiting. Negative for blood in stool.  Neurological:  Positive for dizziness and headaches.     VITALS:   Blood pressure 121/77, pulse 81, temperature 98 F (36.7 C), resp. rate 18, weight 200 lb  13.4 oz (91.1 kg), SpO2 100%.  Wt Readings from Last 3 Encounters:  08/24/24 200 lb 13.4 oz  (91.1 kg)  07/13/24 202 lb (91.6 kg)  06/14/24 202 lb (91.6 kg)    Body mass index is 31.46 kg/m.  Performance status (ECOG): 1 - Symptomatic but completely ambulatory  PHYSICAL EXAM:   Physical Exam Constitutional:      Appearance: Normal appearance.  HENT:     Head: Normocephalic and atraumatic.  Eyes:     Pupils: Pupils are equal, round, and reactive to light.  Cardiovascular:     Rate and Rhythm: Normal rate and regular rhythm.     Heart sounds: Normal heart sounds. No murmur heard. Pulmonary:     Effort: Pulmonary effort is normal.     Breath sounds: Normal breath sounds. No wheezing.  Abdominal:     General: Bowel sounds are normal. There is no distension.     Palpations: Abdomen is soft.     Tenderness: There is no abdominal tenderness.  Musculoskeletal:        General: Normal range of motion.     Cervical back: Normal range of motion.  Skin:    General: Skin is warm and dry.     Findings: No rash.  Neurological:     Mental Status: She is alert and oriented to person, place, and time.  Psychiatric:        Judgment: Judgment normal.     LABS:   CBC     Component Value Date/Time   WBC 8.1 08/24/2024 1335   RBC 4.00 08/24/2024 1335   HGB 13.2 08/24/2024 1335   HGB 13.0 11/17/2023 0934   HCT 39.5 08/24/2024 1335   HCT 39.8 11/17/2023 0934   PLT 234 08/24/2024 1335   PLT 246 11/17/2023 0934   MCV 98.8 08/24/2024 1335   MCV 95 11/17/2023 0934   MCH 33.0 08/24/2024 1335   MCHC 33.4 08/24/2024 1335   RDW 12.1 08/24/2024 1335   RDW 11.9 11/17/2023 0934   LYMPHSABS 1.6 08/24/2024 1335   LYMPHSABS 2.1 11/17/2023 0934   MONOABS 0.5 08/24/2024 1335   EOSABS 0.3 08/24/2024 1335   EOSABS 0.3 11/17/2023 0934   BASOSABS 0.1 08/24/2024 1335   BASOSABS 0.1 11/17/2023 0934    CMP      Component Value Date/Time   NA 141 02/06/2024 1232   NA 141 11/17/2023 0934   K 3.5 02/06/2024 1232   K 4.3 07/12/2012 0755   CL 107 02/06/2024 1232   CL 100 07/12/2012  0755   CO2 25 02/06/2024 1232   GLUCOSE 127 (H) 02/06/2024 1232   BUN 13 02/06/2024 1232   BUN 16 11/17/2023 0934   CREATININE 1.07 (H) 02/06/2024 1232   CREATININE 1.23 (H) 12/11/2020 1023   CALCIUM 9.7 05/10/2024 0942   PROT 7.0 02/06/2024 1232   PROT 6.7 02/01/2023 1411   PROT 8.1 07/12/2012 0755   ALBUMIN 3.9 02/06/2024 1232   ALBUMIN 4.5 02/01/2023 1411   AST 20 02/06/2024 1232   AST 42 07/12/2012 0755   ALT 15 02/06/2024 1232   ALKPHOS 44 02/06/2024 1232   ALKPHOS 32 07/12/2012 0755   BILITOT 0.7 02/06/2024 1232   BILITOT 0.6 02/01/2023 1411   BILITOT 1.0 07/12/2012 0755   GFRNONAA 56 (L) 02/06/2024 1232   GFRNONAA 36 (L) 07/22/2020 1441   GFRAA 41 (L) 07/22/2020 1441     No results found for: CEA1, CEA / No results found for: CEA1, CEA No results found  for: PSA1 No results found for: CAN199 No results found for: CAN125  No results found for: TOTALPROTELP, ALBUMINELP, A1GS, A2GS, BETS, BETA2SER, GAMS, MSPIKE, SPEI Lab Results  Component Value Date   TIBC 507 (H) 08/24/2024   TIBC 329 12/12/2009   FERRITIN 323 (H) 08/24/2024   FERRITIN 455 (H) 12/12/2009   IRONPCTSAT 33 (H) 08/24/2024   IRONPCTSAT 7 (L) 12/12/2009   No results found for: LDH   STUDIES:   No results found.   I spent 25 minutes dedicated to the care of this patient (face-to-face and non-face-to-face) on the date of the encounter to include what is described in the assessment and plan.  Joanne Hope, AGNP-C Department of Hematology/Oncology [facility] at [facility]  Phone: [phone]  08/25/2024 8:21 AM

## 2024-08-24 NOTE — Progress Notes (Signed)
 Patient notified and verbalized understanding.

## 2024-08-24 NOTE — Progress Notes (Signed)
 We you call this patient and let her know that her B12 level is low and she can start on B12 supplements 1000 mcg daily and see if that helps her energy levels.  I am still waiting on a couple other labs to come back but for the most part they look okay.

## 2024-08-25 LAB — COPPER, SERUM: Copper: 63 ug/dL — ABNORMAL LOW (ref 80–158)

## 2024-08-25 NOTE — Addendum Note (Signed)
 Addended by: GEOFM NEST E on: 08/25/2024 08:30 AM   Modules accepted: Orders

## 2024-08-25 NOTE — Progress Notes (Signed)
 Would you mind letting her know she needs to start 1 mg folic acid as well.  Will recheck these labs in 6 months when she comes back.  Sorry for the repeat messages.  Delon Hope, AGNP-C Department of Hematology/Oncology [facility] at [facility]  Phone: [phone]  08/25/2024 8:27 AM

## 2024-08-26 LAB — METHYLMALONIC ACID, SERUM: Methylmalonic Acid, Quantitative: 230 nmol/L (ref 0–378)

## 2024-08-28 ENCOUNTER — Ambulatory Visit (HOSPITAL_COMMUNITY)
Admission: RE | Admit: 2024-08-28 | Discharge: 2024-08-28 | Disposition: A | Discharge status: Home / Self Care | Source: Ambulatory Visit | Attending: Internal Medicine | Admitting: Internal Medicine

## 2024-08-28 ENCOUNTER — Other Ambulatory Visit: Payer: Self-pay | Admitting: *Deleted

## 2024-08-28 ENCOUNTER — Telehealth: Payer: Self-pay | Admitting: Internal Medicine

## 2024-08-28 ENCOUNTER — Other Ambulatory Visit: Payer: Self-pay | Admitting: Internal Medicine

## 2024-08-28 ENCOUNTER — Telehealth: Payer: Self-pay | Admitting: Cardiology

## 2024-08-28 DIAGNOSIS — Z1231 Encounter for screening mammogram for malignant neoplasm of breast: Secondary | ICD-10-CM | POA: Diagnosis not present

## 2024-08-28 DIAGNOSIS — R739 Hyperglycemia, unspecified: Secondary | ICD-10-CM | POA: Diagnosis not present

## 2024-08-28 DIAGNOSIS — R053 Chronic cough: Secondary | ICD-10-CM

## 2024-08-28 DIAGNOSIS — N1831 Chronic kidney disease, stage 3a: Secondary | ICD-10-CM | POA: Diagnosis not present

## 2024-08-28 MED ORDER — HYDROCOD POLI-CHLORPHE POLI ER 10-8 MG/5ML PO SUER: 5.0000 mL | Freq: Two times a day (BID) | ORAL | 0 refills | Status: AC | PRN

## 2024-08-28 NOTE — Telephone Encounter (Signed)
 Patient came by the office asking if Dr Tobie can refill this medicine due to this medicine bemedoic acid make her cough.  She is asking for this medicine chlorpheniramine-HYDROcodone  (TUSSIONEX) 10-8 MG/5ML  Call into her pharmacy [facility].

## 2024-08-28 NOTE — Telephone Encounter (Signed)
 Spoke to patient who stated that since taking Bempedoic Acid , she has been having respiratory issues (SOB), nausea since starting medication. Pt stated that medication makes her so nauseous and sick to her stomach that she is unable to eat. Pt stated that she has scars in her lungs and is having frequent episodes where she is having to use her rescue inhaler more.   Pt stated she has stopped medication until she hears back from provider.   Please advise

## 2024-08-28 NOTE — Telephone Encounter (Signed)
 Patient tells that the bempedoic acid  180mg  is making sick, respiratory issues and making her nauseous she's been dealing with this since she stated them

## 2024-08-28 NOTE — Telephone Encounter (Signed)
 Patient asking to ask Dr Tobie look see her blood work showing she is anemia.

## 2024-08-28 NOTE — Telephone Encounter (Signed)
 Lmtrc

## 2024-08-28 NOTE — Progress Notes (Signed)
 Patient made aware and verbalized understanding.

## 2024-08-29 DIAGNOSIS — H34831 Tributary (branch) retinal vein occlusion, right eye, with macular edema: Secondary | ICD-10-CM | POA: Diagnosis not present

## 2024-08-29 DIAGNOSIS — Z961 Presence of intraocular lens: Secondary | ICD-10-CM | POA: Diagnosis not present

## 2024-08-29 DIAGNOSIS — H43813 Vitreous degeneration, bilateral: Secondary | ICD-10-CM | POA: Diagnosis not present

## 2024-08-29 DIAGNOSIS — H401133 Primary open-angle glaucoma, bilateral, severe stage: Secondary | ICD-10-CM | POA: Diagnosis not present

## 2024-08-29 LAB — BASIC METABOLIC PANEL WITH GFR
BUN/Creatinine Ratio: 15 (ref 12–28)
BUN: 14 mg/dL (ref 8–27)
CO2: 20 mmol/L (ref 20–29)
Calcium: 9.5 mg/dL (ref 8.7–10.3)
Chloride: 104 mmol/L (ref 96–106)
Creatinine, Ser: 0.92 mg/dL (ref 0.57–1.00)
Glucose: 101 mg/dL — ABNORMAL HIGH (ref 70–99)
Potassium: 4 mmol/L (ref 3.5–5.2)
Sodium: 142 mmol/L (ref 134–144)
eGFR: 67 mL/min/1.73 (ref >59)

## 2024-08-29 LAB — CBC WITH DIFFERENTIAL/PLATELET
Basophils Absolute: 0 x10E3/uL (ref 0.0–0.2)
Basos: 1 %
EOS (ABSOLUTE): 0.2 x10E3/uL (ref 0.0–0.4)
Eos: 6 %
Hematocrit: 35.8 % (ref 34.0–46.6)
Hemoglobin: 11.7 g/dL (ref 11.1–15.9)
Immature Grans (Abs): 0 x10E3/uL (ref 0.0–0.1)
Immature Granulocytes: 0 %
Lymphocytes Absolute: 1.6 x10E3/uL (ref 0.7–3.1)
Lymphs: 39 %
MCH: 32.8 pg (ref 26.6–33.0)
MCHC: 32.7 g/dL (ref 31.5–35.7)
MCV: 100 fL — ABNORMAL HIGH (ref 79–97)
Monocytes Absolute: 0.4 x10E3/uL (ref 0.1–0.9)
Monocytes: 9 %
Neutrophils Absolute: 1.8 x10E3/uL (ref 1.4–7.0)
Neutrophils: 44 %
Platelets: 227 x10E3/uL (ref 150–450)
RBC: 3.57 x10E6/uL — ABNORMAL LOW (ref 3.77–5.28)
RDW: 11.7 % (ref 11.7–15.4)
WBC: 4.1 x10E3/uL (ref 3.4–10.8)

## 2024-08-29 LAB — HEMOGLOBIN A1C
Est. average glucose Bld gHb Est-mCnc: 91 mg/dL
Hgb A1c MFr Bld: 4.8 % (ref 4.8–5.6)

## 2024-08-29 NOTE — Telephone Encounter (Signed)
 Spoke to patient and provided recommendations given by provider. Pt very thankful for the call back and stated that she would like to think over starting a [id number]. Pt stated she would call the office back with her decision.   Bempedoic Acid  removed from pt's medication list and added to allergy list.

## 2024-09-06 NOTE — Telephone Encounter (Signed)
 H&V Care Navigation CSW Progress Note  Clinical Social Worker contacted leadership team [name] to inquire about ride request again. Note appt has been changed to March27th with Brittany Strader.  Patient is participating in a Managed Medicaid Plan:  no, Medicare and supplement  SDOH Screenings   Food Insecurity: No Food Insecurity (06/14/2024)  Housing: Low Risk  (06/14/2024)  Transportation Needs: Unmet Transportation Needs (06/14/2024)  Utilities: Not At Risk (06/14/2024)  Alcohol Screen: Low Risk  (06/14/2024)  Depression (PHQ2-9): Low Risk  (08/24/2024)  Recent Concern: Depression (PHQ2-9) - Medium Risk (06/14/2024)  Financial Resource Strain: Low Risk  (06/14/2024)  Physical Activity: Sufficiently Active (06/14/2024)  Social Connections: Socially Isolated (06/14/2024)  Stress: No Stress Concern Present (06/14/2024)  Tobacco Use: Medium Risk (07/13/2024)  Health Literacy: Adequate Health Literacy (06/14/2024)    [name], MSW, LCSW Clinical Social Worker [facility] Care Navigation  [phone]- work cell phone (preferred)

## 2024-10-02 ENCOUNTER — Encounter: Payer: Self-pay | Admitting: *Deleted

## 2024-10-11 ENCOUNTER — Ambulatory Visit: Admitting: Internal Medicine

## 2024-10-11 ENCOUNTER — Encounter: Payer: Self-pay | Admitting: Internal Medicine

## 2024-10-11 VITALS — BP 115/74 | HR 90 | Ht 67.0 in | Wt 206.6 lb

## 2024-10-11 DIAGNOSIS — E039 Hypothyroidism, unspecified: Secondary | ICD-10-CM | POA: Diagnosis not present

## 2024-10-11 DIAGNOSIS — K219 Gastro-esophageal reflux disease without esophagitis: Secondary | ICD-10-CM

## 2024-10-11 DIAGNOSIS — E782 Mixed hyperlipidemia: Secondary | ICD-10-CM

## 2024-10-11 DIAGNOSIS — N1831 Chronic kidney disease, stage 3a: Secondary | ICD-10-CM | POA: Diagnosis not present

## 2024-10-11 DIAGNOSIS — F5104 Psychophysiologic insomnia: Secondary | ICD-10-CM

## 2024-10-11 DIAGNOSIS — L570 Actinic keratosis: Secondary | ICD-10-CM | POA: Diagnosis not present

## 2024-10-11 DIAGNOSIS — E785 Hyperlipidemia, unspecified: Secondary | ICD-10-CM

## 2024-10-11 DIAGNOSIS — R110 Nausea: Secondary | ICD-10-CM | POA: Diagnosis not present

## 2024-10-11 DIAGNOSIS — Z8673 Personal history of transient ischemic attack (TIA), and cerebral infarction without residual deficits: Secondary | ICD-10-CM

## 2024-10-11 DIAGNOSIS — I10 Essential (primary) hypertension: Secondary | ICD-10-CM | POA: Diagnosis not present

## 2024-10-11 DIAGNOSIS — M5030 Other cervical disc degeneration, unspecified cervical region: Secondary | ICD-10-CM | POA: Diagnosis not present

## 2024-10-11 DIAGNOSIS — M5137 Other intervertebral disc degeneration, lumbosacral region with discogenic back pain only: Secondary | ICD-10-CM | POA: Diagnosis not present

## 2024-10-11 DIAGNOSIS — I2089 Other forms of angina pectoris: Secondary | ICD-10-CM

## 2024-10-11 DIAGNOSIS — I2699 Other pulmonary embolism without acute cor pulmonale: Secondary | ICD-10-CM

## 2024-10-11 MED ORDER — RANOLAZINE ER 500 MG PO TB12: 500.0000 mg | ORAL_TABLET | Freq: Two times a day (BID) | ORAL | 3 refills | Status: AC

## 2024-10-11 MED ORDER — FENOFIBRATE 145 MG PO TABS: 145.0000 mg | ORAL_TABLET | Freq: Every day | ORAL | 3 refills | Status: AC

## 2024-10-11 MED ORDER — ONDANSETRON HCL 4 MG PO TABS: 4.0000 mg | ORAL_TABLET | Freq: Three times a day (TID) | ORAL | 2 refills | Status: AC | PRN

## 2024-10-11 MED ORDER — IRBESARTAN 150 MG PO TABS: 75.0000 mg | ORAL_TABLET | Freq: Every day | ORAL | 3 refills | Status: AC

## 2024-10-11 MED ORDER — DEXLANSOPRAZOLE 60 MG PO CPDR: 60.0000 mg | DELAYED_RELEASE_CAPSULE | Freq: Every day | ORAL | 3 refills | Status: DC

## 2024-10-11 MED ORDER — REPATHA SURECLICK 140 MG/ML ~~LOC~~ SOAJ: 140.0000 mg | SUBCUTANEOUS | 3 refills | Status: AC

## 2024-10-11 MED ORDER — APIXABAN 5 MG PO TABS: 5.0000 mg | ORAL_TABLET | Freq: Two times a day (BID) | ORAL | 3 refills | Status: AC

## 2024-10-11 MED ORDER — DEXLANSOPRAZOLE 60 MG PO CPDR: 60.0000 mg | DELAYED_RELEASE_CAPSULE | Freq: Every day | ORAL | 3 refills | Status: AC

## 2024-10-11 NOTE — Patient Instructions (Signed)
 Please take Repatha  as prescribed.  Please continue to take medications as prescribed.  Please continue to follow low salt diet and ambulate as tolerated.

## 2024-10-11 NOTE — Assessment & Plan Note (Signed)
 Continue Eliquis  5 mg twice daily - chills may be related to Eliquis  Followed by heme./Onc. 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

## 2024-10-11 NOTE — Progress Notes (Signed)
 "  Established Patient Office Visit  Subjective:  Patient ID: [name], female    DOB: Oct 02, 1951  Age: 74 y.o. MRN: [id number]  CC:  Chief Complaint  Patient presents with   Follow-up    Follow up, questions about cholesterol injection and anemia    Back Pain    Mid back pain and shoulder pain.    HPI [name] is a 74 y.o. female with past medical history of HTN, GERD, IBS-C, NASH, anal fissure, hypothyroidism, OA, DDD of lumbar spine, anxiety/panic disorder, insomnia, left sided blind eye, G6PD mutation and sickle cell trait who presents for f/u of her chronic medical conditions.  HTN: BP is well-controlled. She takes Irbesartan  75 mg QD. Patient denies dizziness, chest pain, dyspnea or palpitations.  HLD: She has history of HLD, but is unable to tolerate statins.  Her cardiologist recommended to start Repatha  for her, and advised her to discuss with us .  Recurrent PE: She is taking Eliquis  5 mg twice daily currently.  Denies any signs of active bleeding.  Has been evaluated by Hematology clinic for it.  Asthma: She has felt better recently.  Uses albuterol  inhaler as needed for dyspnea or wheezing.  She had to stop Trelegy due to cough after hospitalization. Denies any dyspnea or wheezing currently. Denies any fever or chills.  She has noticed an improvement in daily headache recently, which was generalized, and worse with stress. She takes Maxalt  as needed for migraine.  She had neurology evaluation and was given gabapentin , but she did not try it after reading its side effects.  She has tried Tylenol  for it with some relief. Denies any focal numbness or tingling. She has Xanax  for severe anxiety/panic episode, but does not require it frequently.  She denies anhedonia, SI or HI. She takes Ambien  for insomnia ,but still reports insomnia recently.  She has history of DDD of lumbosacral spine. She reports worsening of mid back pain for the last 1 week.  She slept on the side  with her hand holding her head while watching videos on her tablet in the last week, which provoked the back pain.  She has tried taking Norco for severe pain with relief.  Denies any recent injury or fall.  Denies saddle anesthesia, urinary or stool incontinence.  She has very limited vision in the right eye after cataract surgery (tunnel vision).  She has been having difficulty ambulating even at home, but denies any recent fall.  She has recently noticed peripheral vision loss and has gray spot in the center.  She is followed by ophthalmology.  Denies any eye pain or discharge currently.    Past Medical History:  Diagnosis Date   Anal fissure    Anxiety    Arthritis    Asthma    C. difficile diarrhea 09/17/2021   Cataract    Phreesia 01/05/2021   Chest pain    a. 02/2000 Cath: nl cors, EF 60%;  b. Oct 29, 2010 MV: nl LV, no ischemia/infarct;  c. 03/2014 Admit c/p, r/o->grief from husbands death.   Chronic RUQ pain October 30, 2007   EUS slightly dilated CBD (7.41mm), otherwise nl   Depression    Diverticulosis    Eczema    Exposure to chemical inhalation mid 1970s   Fatty liver    G6PD deficiency    Gallstones    GERD (gastroesophageal reflux disease)    Glaucoma    History of pulmonary embolus (PE)    Treated with Eliquis  October 29, 2014  HTN (hypertension)    Hypercholesteremia    a. intolerant to statins.   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 She has limited vision in the right eye.   Migraines    inner ocular   Ocular migraine    Palpitations    Pleurisy    Pneumonia 07/16/2021   Patient was exposed to a harsh chemical in the 1970's that created scar tissue in her lungs. She has had pneumonia several times in the past.   PONV (postoperative nausea and vomiting)    Pulmonary emboli (HCC)    Recurrent upper respiratory infection (URI)    Renal cyst    Retinal cyst    Sickle cell trait    Tubular  adenoma of colon 09/17/2011   Urticaria     Past Surgical History:  Procedure Laterality Date   ABDOMINAL HYSTERECTOMY     ADENOIDECTOMY     ANGIOPLASTY     APPENDECTOMY     BIOPSY N/A 03/14/2013   Procedure: SMALL BOWEL AND GASTRIC BIOPSIES (Procedure #1);  Surgeon: Margo LITTIE Haddock, MD;  Location: [facility];  Service: Endoscopy;  Laterality: N/A;   CARDIAC CATHETERIZATION Left 02/11/2000   CATARACT EXTRACTION Left    CATARACT EXTRACTION W/PHACO Right 03/19/2021   Procedure: CATARACT EXTRACTION PHACO AND INTRAOCULAR LENS PLACEMENT (IOC) RIGHT 6.75 00:50.4;  Surgeon: Mittie Gaskin, MD;  Location: [facility];  Service: Ophthalmology;  Laterality: Right;   CHOLECYSTECTOMY  12/2007   COLONOSCOPY  12/22/2010   MFM:[id number], cecal adenomatous polyp   COLONOSCOPY WITH PROPOFOL  N/A 03/31/2016   Dr. Haddock: four sessile polyps rectum/sigmoid colon, diverticulosi, ext/int hemorrhoids. hyperplastic polyps, next tcs 5 years.   COLONOSCOPY WITH PROPOFOL  N/A 04/21/2021   anal fissure, non-bleeding internal hemorrhoids, right colon diverticulosis, one 2 mm polyp in ascending, three 4-6 mm polyps in descending colon. Tubular adenomas. 5 year surveillance.   complete hysterectomy     ENTEROSCOPY N/A 03/14/2013   DOQ:fpoi gastritis/ulcers has healed   ESOPHAGOGASTRODUODENOSCOPY  09/22/2011   DOQ:Fpoi gastritis/Duodenitis   EXCISIONAL HEMORRHOIDECTOMY     FLEXIBLE SIGMOIDOSCOPY N/A 03/14/2013   SLF:3 colon polyp removed/moderate sized internal hemorrhoids   FLEXIBLE SIGMOIDOSCOPY N/A 06/09/2016   Procedure: FLEXIBLE SIGMOIDOSCOPY;  Surgeon: Margo LITTIE Haddock, MD;  Location: [facility];  Service: Endoscopy;  Laterality: N/A;  rectal polyps times 2   FLEXIBLE SIGMOIDOSCOPY N/A 03/18/2018   Procedure: FLEXIBLE SIGMOIDOSCOPY;  Surgeon: Haddock Margo LITTIE, MD;  Location: [facility];  Service: Endoscopy;  Laterality: N/A;  9:15am   FOOT SURGERY     bunion removal left   GIVENS CAPSULE STUDY N/A  02/10/2013   Procedure: GIVENS CAPSULE STUDY;  Surgeon: Margo LITTIE Haddock, MD;  Location: [facility];  Service: Endoscopy;  Laterality: N/A;  730   HEEL SPUR EXCISION     right    HEMORRHOID BANDING N/A 03/14/2013   Procedure: HEMORRHOID BANDING (Procedure #3)  3 bands applied Onu#[id number] Exp 02/02/2014 ;  Surgeon: Margo LITTIE Haddock, MD;  Location: [facility];  Service: Endoscopy;  Laterality: N/A;   HEMORRHOID SURGERY N/A 04/24/2016   Procedure: EXTENSIVE HEMORRHOIDECTOMY;  Surgeon: Selinda Artist Moats, MD;  Location: [facility];  Service: General;  Laterality: N/A;   LAPAROSCOPY     adhesions   POLYPECTOMY N/A 03/14/2013   DOQ:FPOI Gastritis . ULCERS SEEN ON MAY 6 HAVE HEALED   POLYPECTOMY  03/31/2016   Procedure: POLYPECTOMY;  Surgeon: Margo LITTIE Haddock, MD;  Location: [facility];  Service: Endoscopy;;  sigmoid colon polyps x2, rectal polyps x2   POLYPECTOMY  04/21/2021   Procedure: POLYPECTOMY;  Surgeon: Cindie Carlin POUR, DO;  Location: [facility];  Service: Endoscopy;;   RECTAL EXAM UNDER ANESTHESIA N/A 07/23/2021   Procedure: RECTAL EXAM UNDER ANESTHESIA;  Surgeon: Debby Hila, MD;  Location: [facility] [place];  Service: General;  Laterality: N/A;   SPHINCTEROTOMY N/A 07/23/2021   Procedure: CHEMICAL SPHINCTEROTOMY;  Surgeon: Debby Hila, MD;  Location: [facility];  Service: General;  Laterality: N/A;   TONSILLECTOMY     TUBAL LIGATION      Family History  Problem Relation Age of Onset   Colon cancer Mother        53s   Urticaria Mother    Heart disease Mother    Colon polyps Mother    Cancer Father        oral   Crohn's disease Sister    Cancer Sister        around heart   Colon polyps Sister    Diabetes Brother    Other Brother        intestinal sugery   Colon polyps Brother    Colon cancer Maternal Grandmother    Colon cancer Maternal Grandfather    Colon cancer Paternal Grandfather    Anesthesia problems Neg Hx     Social History    Socioeconomic History   Marital status: Widowed    Spouse name: Not on file   Number of children: 1   Years of education: Not on file   Highest education level: Not on file  Occupational History   Occupation: homemaker    Employer: UNEMPLOYED  Tobacco Use   Smoking status: Former    Current packs/day: 0.00    Average packs/day: 0.5 packs/day for 30.0 years (15.0 ttl pk-yrs)    Types: Cigarettes    Start date: 10/15/1966    Quit date: 10/15/1996    Years since quitting: 28.0   Smokeless tobacco: Never   Tobacco comments:    Stopped smoking ~ 2000  Vaping Use   Vaping status: Never Used  Substance and Sexual Activity   Alcohol use: Not Currently    Alcohol/week: 4.0 standard drinks of alcohol    Types: 4 Glasses of wine per week    Comment: 1 drinks every couple of weeks   Drug use: No   Sexual activity: Not Currently    Birth control/protection: Surgical  Other Topics Concern   Not on file  Social History Narrative   Does not routinely exercise. Husband passed in JUN 2015 due to prostate ca.   Social Drivers of Health   Tobacco Use: Medium Risk (10/11/2024)   Patient History    Smoking Tobacco Use: Former    Smokeless Tobacco Use: Never    Passive Exposure: Not on file  Financial Resource Strain: Low Risk (06/14/2024)   Overall Financial Resource Strain (CARDIA)    Difficulty of Paying Living Expenses: Not hard at all  Food Insecurity: No Food Insecurity (06/14/2024)   Epic    Worried About Programme Researcher, Broadcasting/film/video in the Last Year: Never true    Ran Out of Food in the Last Year: Never true  Transportation Needs: Unmet Transportation Needs (06/14/2024)   Epic    Lack of Transportation (Medical): Yes    Lack of Transportation (Non-Medical): Yes  Physical Activity:  Sufficiently Active (06/14/2024)   Exercise Vital Sign    Days of Exercise per Week: 7 days    Minutes of Exercise per Session: 30 min  Stress: No Stress Concern Present (06/14/2024)   [employer] of  Occupational Health - Occupational Stress Questionnaire    Feeling of Stress: Not at all  Social Connections: Socially Isolated (06/14/2024)   Social Connection and Isolation Panel    Frequency of Communication with Friends and Family: Three times a week    Frequency of Social Gatherings with Friends and Family: Once a week    Attends Religious Services: Never    Database Administrator or Organizations: No    Attends Banker Meetings: Never    Marital Status: Widowed  Intimate Partner Violence: Not At Risk (06/14/2024)   Epic    Fear of Current or Ex-Partner: No    Emotionally Abused: No    Physically Abused: No    Sexually Abused: No  Depression (PHQ2-9): Low Risk (10/11/2024)   Depression (PHQ2-9)    PHQ-2 Score: 0  Alcohol Screen: Low Risk (06/14/2024)   Alcohol Screen    Last Alcohol Screening Score (AUDIT): 0  Housing: Low Risk (06/14/2024)   Epic    Unable to Pay for Housing in the Last Year: No    Number of Times Moved in the Last Year: 0    Homeless in the Last Year: No  Utilities: Not At Risk (06/14/2024)   Epic    Threatened with loss of utilities: No  Health Literacy: Adequate Health Literacy (06/14/2024)   B1300 Health Literacy    Frequency of need for help with medical instructions: Never    Outpatient Medications Prior to Visit  Medication Sig Dispense Refill   albuterol  (PROVENTIL ) (2.5 MG/3ML) 0.083% nebulizer solution Take 2.5 mg by nebulization every 6 (six) hours as needed for wheezing or shortness of breath.     albuterol  (VENTOLIN  HFA) 108 (90 Base) MCG/ACT inhaler Inhale into the lungs every 6 (six) hours as needed for wheezing or shortness of breath.     ALPRAZolam  (XANAX ) 1 MG tablet Take 1 mg by mouth at bedtime as needed for anxiety. Takes 1 a day if needed     amoxicillin  (AMOXIL ) 500 MG capsule Take 4 capsules at once by mouth 1 hour prior to the dental procedure. 4 capsule 1   brimonidine  (ALPHAGAN ) 0.2 % ophthalmic solution Place 1 drop into  both eyes 2 (two) times daily. 15 mL 3   chlorpheniramine-HYDROcodone  (TUSSIONEX) 10-8 MG/5ML Take 5 mLs by mouth every 12 (twelve) hours as needed for cough. 115 mL 0   Cholecalciferol  (VITAMIN D3) 50 MCG (2000 UT) TABS Take 2,000 Units by mouth daily.     cyanocobalamin  (VITAMIN B12) 1000 MCG tablet Take 1,000 mcg by mouth daily.     diltiazem 2 % GEL Apply 1 Application topically 2 (two) times daily.     estradiol  (ESTRACE ) 0.1 MG/GM vaginal cream Place 1 Applicatorful vaginally at bedtime.     folic acid  (FOLVITE ) 1 MG tablet Take 1 mg by mouth daily.     HYDROcodone -acetaminophen  (NORCO/VICODIN) 5-325 MG tablet Take 1 tablet by mouth every 6 (six) hours as needed for moderate pain (pain score 4-6). 20 tablet 0   latanoprost  (XALATAN ) 0.005 % ophthalmic solution Place 1 drop into both eyes at bedtime. 9 mL 3   lidocaine  (XYLOCAINE ) 5 % ointment Apply 1 Application topically as needed. 35.44 g 0   Magnesium  500 MG TABS Take  1 tablet by mouth in the morning and at bedtime. Taking 1 in he morning, afternoon, and at bedtime     meclizine  (ANTIVERT ) 12.5 MG tablet Take 12.5 mg by mouth 3 (three) times daily as needed for dizziness.     metroNIDAZOLE  (METROCREAM ) 0.75 % cream Apply topically 2 (two) times daily.     nitroGLYCERIN  (NITROLINGUAL ) 0.4 MG/SPRAY spray Place 1 spray under the tongue every 5 (five) minutes as needed for chest pain. 12 g 3   Nitroglycerin  0.4 % OINT Place rectally.     Probiotic Product (PROBIOTIC DAILY PO) Take 1 capsule by mouth daily. VSL #3     rizatriptan  (MAXALT -MLT) 10 MG disintegrating tablet DISSOLVE ONE TABLET ON TONGUE AS NEEDED FOR MIGRAINE. MAY REPEAT IN 2 HOURS IF NEEDED 9 tablet 6   XIIDRA  5 % SOLN Apply 1 drop to eye at bedtime.     zolpidem  (AMBIEN ) 10 MG tablet Take 1 tablet (10 mg total) by mouth at bedtime as needed for sleep. 90 tablet 1   apixaban  (ELIQUIS ) 5 MG TABS tablet Take 1 tablet (5 mg total) by mouth 2 (two) times daily. 180 tablet 3   copper   tablet Take 2 mg by mouth daily.     dexlansoprazole  (DEXILANT ) 60 MG capsule Take 1 capsule (60 mg total) by mouth daily. 90 capsule 3   fenofibrate  (TRICOR ) 145 MG tablet Take 1 tablet (145 mg total) by mouth daily. 90 tablet 3   irbesartan  (AVAPRO ) 150 MG tablet TAKE ONE-HALF TABLET BY MOUTH EVERY DAY 45 tablet 1   methylPREDNISolone  (MEDROL  DOSEPAK) 4 MG TBPK tablet Take as package instructions. 1 each 0   mirtazapine  (REMERON ) 7.5 MG tablet Take 1 tablet (7.5 mg total) by mouth at bedtime. 90 tablet 0   ondansetron  (ZOFRAN ) 4 MG tablet TAKE ONE TABLET BY MOUTH EVERY 8 HOURS AS NEEDED FOR NAUSEA OR VOMITING 30 tablet 1   ranolazine  (RANEXA ) 500 MG 12 hr tablet Take 1 tablet (500 mg total) by mouth 2 (two) times daily. 180 tablet 3   No facility-administered medications prior to visit.    Allergies  Allergen Reactions   Nutmeg Oil (Myristica Oil) Anaphylaxis    Nut oil- skin irritation   Adhesive [Tape] Other (See Comments)    Blisters skin   Aspirin Other (See Comments)    Sickle cell trait--  Not recommended unless emergency   Bempedoic Acid  Nausea And Vomiting and Cough   Imitrex  [Sumatriptan ] Hives   Levaquin  [Levofloxacin  In D5w] Other (See Comments)    Altered mental status Affects G6PD   Statins Other (See Comments)    Myopathy - elevated CK   Nsaids Other (See Comments)    Pt on blood thinner.    Zetia  [Ezetimibe ] Other (See Comments)    headache   Keflex  [Cephalexin ] Other (See Comments)    G6PD   Latex Rash   Sulfa Antibiotics Other (See Comments)    Patient has sickle cell trait    ROS Review of Systems  Constitutional:  Negative for chills and fever.  HENT:  Negative for congestion and sore throat.   Eyes:  Positive for photophobia, pain and visual disturbance. Negative for discharge.  Respiratory:  Positive for cough (Intermittent). Negative for shortness of breath and wheezing.   Cardiovascular:  Negative for chest pain and palpitations.   Gastrointestinal:  Positive for constipation. Negative for abdominal pain, blood in stool, diarrhea, nausea and vomiting.  Endocrine: Negative for polydipsia and polyuria.  Genitourinary:  Negative for  dysuria and hematuria.  Musculoskeletal:  Positive for arthralgias, back pain and neck pain. Negative for neck stiffness.  Skin:  Negative for rash.  Neurological:  Positive for headaches. Negative for weakness.  Psychiatric/Behavioral:  Negative for agitation and behavioral problems.       Objective:    Physical Exam Vitals reviewed.  Constitutional:      General: She is not in acute distress.    Appearance: She is not diaphoretic.  HENT:     Head: Normocephalic and atraumatic.     Nose: No congestion.     Mouth/Throat:     Mouth: Mucous membranes are moist.  Eyes:     General: No scleral icterus.    Extraocular Movements: Extraocular movements intact.  Cardiovascular:     Rate and Rhythm: Normal rate and regular rhythm.     Heart sounds: Normal heart sounds. No murmur heard. Pulmonary:     Breath sounds: Normal breath sounds. No wheezing or rales.  Musculoskeletal:        General: Tenderness (Lower lumbar spine area) present.     Left hand: Decreased strength of thumb/finger opposition.     Cervical back: Neck supple. No tenderness. Pain with movement present.     Thoracic back: Tenderness present.     Right lower leg: No edema.     Left lower leg: No edema.  Skin:    General: Skin is warm.     Findings: No rash.     Comments: About 1 cm in diameter light brownish lesion on left forehead area  Neurological:     General: No focal deficit present.     Mental Status: She is alert and oriented to person, place, and time.     Sensory: No sensory deficit.     Motor: No weakness.  Psychiatric:        Mood and Affect: Mood normal.        Behavior: Behavior normal.     BP 115/74   Pulse 90   Ht 5' 7 (1.702 m)   Wt 206 lb 9.6 oz (93.7 kg)   SpO2 96%   BMI 32.36 kg/m   Wt Readings from Last 3 Encounters:  10/11/24 206 lb 9.6 oz (93.7 kg)  08/24/24 200 lb 13.4 oz (91.1 kg)  07/13/24 202 lb (91.6 kg)    Lab Results  Component Value Date   TSH 2.572 05/02/2024   Lab Results  Component Value Date   WBC 4.1 08/28/2024   HGB 11.7 08/28/2024   HCT 35.8 08/28/2024   MCV 100 (H) 08/28/2024   PLT 227 08/28/2024   Lab Results  Component Value Date   NA 142 08/28/2024   K 4.0 08/28/2024   CO2 20 08/28/2024   GLUCOSE 101 (H) 08/28/2024   BUN 14 08/28/2024   CREATININE 0.92 08/28/2024   BILITOT 0.7 02/06/2024   ALKPHOS 44 02/06/2024   AST 20 02/06/2024   ALT 15 02/06/2024   PROT 7.0 02/06/2024   ALBUMIN 3.9 02/06/2024   CALCIUM 9.5 08/28/2024   ANIONGAP 9 02/06/2024   EGFR 67 08/28/2024   Lab Results  Component Value Date   CHOL 242 (H) 05/02/2024   Lab Results  Component Value Date   HDL 35 (L) 05/02/2024   Lab Results  Component Value Date   LDLCALC 168 (H) 05/02/2024   Lab Results  Component Value Date   TRIG 194 (H) 05/02/2024   Lab Results  Component Value Date   CHOLHDL 6.9 05/02/2024  Lab Results  Component Value Date   HGBA1C 4.8 08/28/2024      Assessment & Plan:   Problem List Items Addressed This Visit       Cardiovascular and Mediastinum   Essential hypertension, benign - Primary    BP Readings from Last 1 Encounters:  10/11/24 115/74   Well-controlled with Irbesartan  75 mg QD Counseled for compliance with the medications Advised DASH diet and moderate exercise/walking as tolerated      Relevant Medications   Evolocumab  (REPATHA  SURECLICK) 140 MG/ML SOAJ   apixaban  (ELIQUIS ) 5 MG TABS tablet   fenofibrate  (TRICOR ) 145 MG tablet   irbesartan  (AVAPRO ) 150 MG tablet   ranolazine  (RANEXA ) 500 MG 12 hr tablet   Microvascular angina   Continue Ranexa , refilled as per patient request Has nitroglycerin  as needed for pain, has not required it recently - refilled upon patient request LDL not at goal,  unable to tolerate statins Started Repatha  Followed by cardiology      Relevant Medications   Evolocumab  (REPATHA  SURECLICK) 140 MG/ML SOAJ   apixaban  (ELIQUIS ) 5 MG TABS tablet   fenofibrate  (TRICOR ) 145 MG tablet   irbesartan  (AVAPRO ) 150 MG tablet   ranolazine  (RANEXA ) 500 MG 12 hr tablet   Recurrent pulmonary embolism (HCC)   Continue Eliquis  5 mg twice daily - chills may be related to Eliquis  Followed by heme./Onc. - considering recurrent pulmonary embolism, history of G6PD mutation and sickle cell trait, needs indefinite AC Evaluated by pulmonology for recurrent PE and asthma      Relevant Medications   Evolocumab  (REPATHA  SURECLICK) 140 MG/ML SOAJ   apixaban  (ELIQUIS ) 5 MG TABS tablet   fenofibrate  (TRICOR ) 145 MG tablet   irbesartan  (AVAPRO ) 150 MG tablet   ranolazine  (RANEXA ) 500 MG 12 hr tablet     Digestive   GERD (gastroesophageal reflux disease)   Overall well-controlled currently Takes Dexilant  60 mg QD Avoid hot and spicy food      Relevant Medications   dexlansoprazole  (DEXILANT ) 60 MG capsule   ondansetron  (ZOFRAN ) 4 MG tablet     Endocrine   Hypothyroidism   Lab Results  Component Value Date   TSH 2.572 05/02/2024   Was on Levothyroxine  25 mcg once daily, later discontinued due to elevated free T4 Follows up with Dr Lenis Checked TSH and free T4        Musculoskeletal and Integument   DDD (degenerative disc disease), lumbosacral   Avoid heavy lifting and frequent bending Advised to use back brace and/or heating pad as needed Has been evaluated by orthopedic surgeon - Norco as needed for severe pain Unable to take oral NSAIDs      Disc disease, degenerative, cervical   Previous x-ray of her cervical spine showed DDD of cervical spine with narrowing of disc space Old MRI of cervical spine showed disc disease and minimal bone spur Recent neck pain likely due to cervical muscle strain Advised to perform warm compresses Norco as needed for  severe pain      Actinic keratosis of forehead   On left-sided forehead, appears well-demarcated Advised to contact if any change in size, shape or color        Genitourinary   Stage 3a chronic kidney disease (HCC)   Last BMP reviewed, GFR stays around 50-55 Advised to maintain adequate hydration Avoid nephrotoxic agents, including NSAIDs Check CBC and BMP        Other   Chronic insomnia (Chronic)   Usually well-controlled, recently worse due to  multiple office visits and increasing stress due to medical conditions Takes Ambien  10 mg qHS PRN Has Xanax  1 mg as needed for anxiety/panic episodes, she did not take Ambien  and Xanax  together      Hyperlipidemia   On fenofibrate  and Zetia  Does not tolerate statin Started Repatha  Check lipid profile      Relevant Medications   Evolocumab  (REPATHA  SURECLICK) 140 MG/ML SOAJ   apixaban  (ELIQUIS ) 5 MG TABS tablet   fenofibrate  (TRICOR ) 145 MG tablet   irbesartan  (AVAPRO ) 150 MG tablet   ranolazine  (RANEXA ) 500 MG 12 hr tablet   History of TIA (transient ischemic attack)   Had transient alteration in mental status and was admitted in the hospital in 05/25 Currently on Eliquis  Did not tolerate statins, Nexletol  Started Repatha       Relevant Medications   Evolocumab  (REPATHA  SURECLICK) 140 MG/ML SOAJ   Other Visit Diagnoses       Nausea       Relevant Medications   ondansetron  (ZOFRAN ) 4 MG tablet          Meds ordered this encounter  Medications   Evolocumab  (REPATHA  SURECLICK) 140 MG/ML SOAJ    Sig: Inject 140 mg into the skin every 14 (fourteen) days.    Dispense:  6 mL    Refill:  3   apixaban  (ELIQUIS ) 5 MG TABS tablet    Sig: Take 1 tablet (5 mg total) by mouth 2 (two) times daily.    Dispense:  180 tablet    Refill:  3   DISCONTD: dexlansoprazole  (DEXILANT ) 60 MG capsule    Sig: Take 1 capsule (60 mg total) by mouth daily.    Dispense:  90 capsule    Refill:  3    FAX TO: [facility] [phone]   fenofibrate  (TRICOR ) 145 MG tablet    Sig: Take 1 tablet (145 mg total) by mouth daily.    Dispense:  90 tablet    Refill:  3    FAX TO: [facility] [phone]   irbesartan  (AVAPRO ) 150 MG tablet    Sig: Take 0.5 tablets (75 mg total) by mouth daily.    Dispense:  45 tablet    Refill:  3   ranolazine  (RANEXA ) 500 MG 12 hr tablet    Sig: Take 1 tablet (500 mg total) by mouth 2 (two) times daily.    Dispense:  180 tablet    Refill:  3    Patient requests 90-day supply   dexlansoprazole  (DEXILANT ) 60 MG capsule    Sig: Take 1 capsule (60 mg total) by mouth daily.    Dispense:  90 capsule    Refill:  3    FAX TO: [facility] [phone]   ondansetron  (ZOFRAN ) 4 MG tablet    Sig: Take 1 tablet (4 mg total) by mouth every 8 (eight) hours as needed for nausea or vomiting.    Dispense:  30 tablet    Refill:  2    Follow-up: Return in about 4 months (around 02/08/2025).    Suzzane MARLA Blanch, MD "

## 2024-10-11 NOTE — Assessment & Plan Note (Addendum)
 Overall well-controlled currently Takes Dexilant  60 mg QD Avoid hot and spicy food

## 2024-10-11 NOTE — Assessment & Plan Note (Signed)
 Lab Results  Component Value Date   TSH 2.572 05/02/2024   Was on Levothyroxine  25 mcg once daily, later discontinued due to elevated free T4 Follows up with Dr Lenis Checked TSH and free T4

## 2024-10-11 NOTE — Assessment & Plan Note (Signed)
 Had transient alteration in mental status and was admitted in the hospital in 05/25 Currently on Eliquis  Did not tolerate statins, Nexletol  Started Repatha 

## 2024-10-11 NOTE — Assessment & Plan Note (Signed)
" °  BP Readings from Last 1 Encounters:  10/11/24 115/74   Well-controlled with Irbesartan  75 mg QD Counseled for compliance with the medications Advised DASH diet and moderate exercise/walking as tolerated "

## 2024-10-11 NOTE — Assessment & Plan Note (Signed)
 Last BMP reviewed, GFR stays around 50-55 Advised to maintain adequate hydration Avoid nephrotoxic agents, including NSAIDs Check CBC and BMP

## 2024-10-13 NOTE — Assessment & Plan Note (Signed)
 On fenofibrate  and Zetia  Does not tolerate statin Started Repatha  Check lipid profile

## 2024-10-13 NOTE — Assessment & Plan Note (Addendum)
 On left-sided forehead, appears well-demarcated Advised to contact if any change in size, shape or color

## 2024-10-13 NOTE — Assessment & Plan Note (Addendum)
 Continue Ranexa , refilled as per patient request Has nitroglycerin  as needed for pain, has not required it recently - refilled upon patient request LDL not at goal, unable to tolerate statins Started Repatha  Followed by cardiology

## 2024-10-13 NOTE — Assessment & Plan Note (Signed)
 Avoid heavy lifting and frequent bending Advised to use back brace and/or heating pad as needed Has been evaluated by orthopedic surgeon - Norco as needed for severe pain Unable to take oral NSAIDs

## 2024-10-13 NOTE — Assessment & Plan Note (Signed)
 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

## 2024-10-13 NOTE — Assessment & Plan Note (Signed)
 Usually well-controlled, recently worse due to multiple office visits and increasing stress due to medical conditions Takes Ambien  10 mg qHS PRN Has Xanax  1 mg as needed for anxiety/panic episodes, she did not take Ambien  and Xanax  together

## 2024-11-08 ENCOUNTER — Ambulatory Visit: Admitting: Cardiology

## 2024-11-20 ENCOUNTER — Ambulatory Visit: Admitting: "Endocrinology

## 2024-11-23 ENCOUNTER — Ambulatory Visit: Admitting: Internal Medicine

## 2024-12-29 ENCOUNTER — Ambulatory Visit: Admitting: Student

## 2025-02-13 ENCOUNTER — Ambulatory Visit: Payer: Self-pay | Admitting: Internal Medicine

## 2025-02-14 ENCOUNTER — Inpatient Hospital Stay

## 2025-02-21 ENCOUNTER — Inpatient Hospital Stay: Admitting: Oncology

## 2025-06-19 ENCOUNTER — Ambulatory Visit
